# Patient Record
Sex: Male | Born: 1935 | Race: White | Hispanic: No | Smoking: Former smoker
Health system: Southern US, Community
[De-identification: ages and names within clinical notes are randomized; demographics above are authoritative.]

## PROBLEM LIST (undated history)

## (undated) DIAGNOSIS — K56609 Unspecified intestinal obstruction, unspecified as to partial versus complete obstruction: Secondary | ICD-10-CM

## (undated) DIAGNOSIS — Z95810 Presence of automatic (implantable) cardiac defibrillator: Secondary | ICD-10-CM

## (undated) DIAGNOSIS — Q676 Pectus excavatum: Secondary | ICD-10-CM

## (undated) DIAGNOSIS — I2699 Other pulmonary embolism without acute cor pulmonale: Secondary | ICD-10-CM

## (undated) DIAGNOSIS — I10 Essential (primary) hypertension: Secondary | ICD-10-CM

## (undated) DIAGNOSIS — M810 Age-related osteoporosis without current pathological fracture: Secondary | ICD-10-CM

## (undated) DIAGNOSIS — F32A Depression, unspecified: Secondary | ICD-10-CM

## (undated) DIAGNOSIS — K4090 Unilateral inguinal hernia, without obstruction or gangrene, not specified as recurrent: Secondary | ICD-10-CM

## (undated) DIAGNOSIS — I2510 Atherosclerotic heart disease of native coronary artery without angina pectoris: Secondary | ICD-10-CM

## (undated) DIAGNOSIS — I495 Sick sinus syndrome: Secondary | ICD-10-CM

## (undated) DIAGNOSIS — F329 Major depressive disorder, single episode, unspecified: Secondary | ICD-10-CM

## (undated) DIAGNOSIS — E785 Hyperlipidemia, unspecified: Secondary | ICD-10-CM

## (undated) DIAGNOSIS — Z87442 Personal history of urinary calculi: Secondary | ICD-10-CM

## (undated) DIAGNOSIS — I5022 Chronic systolic (congestive) heart failure: Secondary | ICD-10-CM

## (undated) DIAGNOSIS — I255 Ischemic cardiomyopathy: Secondary | ICD-10-CM

## (undated) DIAGNOSIS — D649 Anemia, unspecified: Secondary | ICD-10-CM

## (undated) DIAGNOSIS — N281 Cyst of kidney, acquired: Secondary | ICD-10-CM

## (undated) DIAGNOSIS — I480 Paroxysmal atrial fibrillation: Secondary | ICD-10-CM

## (undated) DIAGNOSIS — C4490 Unspecified malignant neoplasm of skin, unspecified: Secondary | ICD-10-CM

## (undated) DIAGNOSIS — I4901 Ventricular fibrillation: Secondary | ICD-10-CM

## (undated) DIAGNOSIS — D689 Coagulation defect, unspecified: Secondary | ICD-10-CM

## (undated) DIAGNOSIS — I219 Acute myocardial infarction, unspecified: Secondary | ICD-10-CM

## (undated) HISTORY — PX: CYSTOSCOPY/RETROGRADE/URETEROSCOPY/STONE EXTRACTION WITH BASKET: SHX5317

## (undated) HISTORY — PX: PTCA: SHX146

## (undated) HISTORY — DX: Other pulmonary embolism without acute cor pulmonale: I26.99

## (undated) HISTORY — DX: Major depressive disorder, single episode, unspecified: F32.9

## (undated) HISTORY — DX: Depression, unspecified: F32.A

## (undated) HISTORY — DX: Pectus excavatum: Q67.6

## (undated) HISTORY — PX: COLONOSCOPY: SHX174

## (undated) HISTORY — PX: SKIN CANCER EXCISION: SHX779

## (undated) HISTORY — DX: Personal history of urinary calculi: Z87.442

## (undated) HISTORY — DX: Coagulation defect, unspecified: D68.9

## (undated) HISTORY — PX: CORONARY ANGIOPLASTY WITH STENT PLACEMENT: SHX49

## (undated) HISTORY — DX: Atherosclerotic heart disease of native coronary artery without angina pectoris: I25.10

## (undated) HISTORY — DX: Anemia, unspecified: D64.9

## (undated) HISTORY — DX: Unspecified intestinal obstruction, unspecified as to partial versus complete obstruction: K56.609

## (undated) HISTORY — PX: CATARACT EXTRACTION W/ INTRAOCULAR LENS  IMPLANT, BILATERAL: SHX1307

## (undated) HISTORY — PX: TRANSURETHRAL RESECTION OF PROSTATE: SHX73

## (undated) HISTORY — DX: Ischemic cardiomyopathy: I25.5

## (undated) HISTORY — DX: Age-related osteoporosis without current pathological fracture: M81.0

## (undated) HISTORY — DX: Sick sinus syndrome: I49.5

## (undated) HISTORY — DX: Chronic systolic (congestive) heart failure: I50.22

## (undated) HISTORY — DX: Unilateral inguinal hernia, without obstruction or gangrene, not specified as recurrent: K40.90

## (undated) HISTORY — DX: Paroxysmal atrial fibrillation: I48.0

## (undated) HISTORY — DX: Hyperlipidemia, unspecified: E78.5

---

## 1994-06-08 HISTORY — PX: INGUINAL HERNIA REPAIR: SUR1180

## 1994-06-08 HISTORY — PX: CORONARY ARTERY BYPASS GRAFT: SHX141

## 2000-06-01 ENCOUNTER — Inpatient Hospital Stay (HOSPITAL_COMMUNITY): Admission: EM | Admit: 2000-06-01 | Discharge: 2000-06-09 | Payer: Self-pay

## 2000-06-01 ENCOUNTER — Encounter: Payer: Self-pay | Admitting: Emergency Medicine

## 2000-06-01 ENCOUNTER — Encounter: Payer: Self-pay | Admitting: Surgery

## 2000-06-02 ENCOUNTER — Encounter: Payer: Self-pay | Admitting: General Surgery

## 2001-04-01 ENCOUNTER — Inpatient Hospital Stay (HOSPITAL_COMMUNITY): Admission: AD | Admit: 2001-04-01 | Discharge: 2001-04-05 | Payer: Self-pay | Admitting: Cardiology

## 2001-04-03 ENCOUNTER — Encounter: Payer: Self-pay | Admitting: Cardiology

## 2001-09-16 ENCOUNTER — Inpatient Hospital Stay (HOSPITAL_COMMUNITY): Admission: AD | Admit: 2001-09-16 | Discharge: 2001-09-23 | Payer: Self-pay | Admitting: Cardiology

## 2001-09-16 ENCOUNTER — Encounter: Payer: Self-pay | Admitting: Cardiology

## 2004-05-05 ENCOUNTER — Ambulatory Visit: Payer: Self-pay | Admitting: Cardiology

## 2004-06-03 ENCOUNTER — Ambulatory Visit: Payer: Self-pay | Admitting: Cardiology

## 2005-04-15 ENCOUNTER — Ambulatory Visit: Payer: Self-pay | Admitting: Cardiology

## 2005-05-04 ENCOUNTER — Ambulatory Visit: Payer: Self-pay | Admitting: Cardiology

## 2005-05-18 ENCOUNTER — Ambulatory Visit: Payer: Self-pay

## 2006-04-12 ENCOUNTER — Ambulatory Visit: Payer: Self-pay | Admitting: Cardiology

## 2006-04-19 ENCOUNTER — Ambulatory Visit: Payer: Self-pay | Admitting: Cardiovascular Disease

## 2006-04-19 ENCOUNTER — Ambulatory Visit: Payer: Self-pay

## 2006-05-03 ENCOUNTER — Ambulatory Visit: Payer: Self-pay | Admitting: Cardiology

## 2007-04-14 ENCOUNTER — Ambulatory Visit: Payer: Self-pay | Admitting: Cardiology

## 2007-09-05 ENCOUNTER — Ambulatory Visit: Payer: Self-pay | Admitting: Cardiology

## 2007-09-05 LAB — CONVERTED CEMR LAB
ALT: 18 units/L (ref 0–53)
AST: 22 units/L (ref 0–37)
Albumin: 3.6 g/dL (ref 3.5–5.2)
Alkaline Phosphatase: 43 units/L (ref 39–117)
Bilirubin, Direct: 0.2 mg/dL (ref 0.0–0.3)
Cholesterol: 130 mg/dL (ref 0–200)
HDL: 43.5 mg/dL (ref >39.0)
LDL Cholesterol: 72 mg/dL (ref 0–99)
Total Bilirubin: 0.8 mg/dL (ref 0.3–1.2)
Total CHOL/HDL Ratio: 3
Total Protein: 6.3 g/dL (ref 6.0–8.3)
Triglycerides: 73 mg/dL (ref 0–149)
VLDL: 15 mg/dL (ref 0–40)

## 2008-04-02 ENCOUNTER — Ambulatory Visit: Payer: Self-pay | Admitting: Cardiology

## 2008-04-02 LAB — CONVERTED CEMR LAB
ALT: 17 units/L (ref 0–53)
AST: 24 units/L (ref 0–37)
Albumin: 3.6 g/dL (ref 3.5–5.2)
Alkaline Phosphatase: 45 units/L (ref 39–117)
Bilirubin, Direct: 0.1 mg/dL (ref 0.0–0.3)
Cholesterol: 129 mg/dL (ref 0–200)
HDL: 41.9 mg/dL (ref >39.0)
LDL Cholesterol: 72 mg/dL (ref 0–99)
Total Bilirubin: 0.8 mg/dL (ref 0.3–1.2)
Total CHOL/HDL Ratio: 3.1
Total Protein: 6.2 g/dL (ref 6.0–8.3)
Triglycerides: 75 mg/dL (ref 0–149)
VLDL: 15 mg/dL (ref 0–40)

## 2008-04-18 ENCOUNTER — Ambulatory Visit: Payer: Self-pay | Admitting: Cardiology

## 2008-05-07 ENCOUNTER — Ambulatory Visit: Payer: Self-pay

## 2008-09-27 ENCOUNTER — Telehealth (INDEPENDENT_AMBULATORY_CARE_PROVIDER_SITE_OTHER): Payer: Self-pay | Admitting: *Deleted

## 2009-04-17 DIAGNOSIS — Z87891 Personal history of nicotine dependence: Secondary | ICD-10-CM | POA: Insufficient documentation

## 2009-04-17 DIAGNOSIS — I10 Essential (primary) hypertension: Secondary | ICD-10-CM | POA: Insufficient documentation

## 2009-04-17 DIAGNOSIS — E785 Hyperlipidemia, unspecified: Secondary | ICD-10-CM | POA: Insufficient documentation

## 2009-04-17 DIAGNOSIS — I252 Old myocardial infarction: Secondary | ICD-10-CM | POA: Insufficient documentation

## 2009-05-01 ENCOUNTER — Ambulatory Visit: Payer: Self-pay | Admitting: Cardiology

## 2009-05-06 ENCOUNTER — Ambulatory Visit: Payer: Self-pay | Admitting: Cardiology

## 2009-05-07 LAB — CONVERTED CEMR LAB
ALT: 16 units/L (ref 0–53)
AST: 23 units/L (ref 0–37)
Albumin: 3.7 g/dL (ref 3.5–5.2)
Alkaline Phosphatase: 48 units/L (ref 39–117)
BUN: 23 mg/dL (ref 6–23)
Bilirubin, Direct: 0.1 mg/dL (ref 0.0–0.3)
CO2: 28 meq/L (ref 19–32)
Calcium: 8.8 mg/dL (ref 8.4–10.5)
Chloride: 108 meq/L (ref 96–112)
Cholesterol: 128 mg/dL (ref 0–200)
Creatinine, Ser: 1.1 mg/dL (ref 0.4–1.5)
GFR calc non Af Amer: 69.5 mL/min (ref >60)
Glucose, Bld: 95 mg/dL (ref 70–99)
HDL: 47.5 mg/dL (ref >39.00)
LDL Cholesterol: 71 mg/dL (ref 0–99)
Potassium: 4.2 meq/L (ref 3.5–5.1)
Sodium: 142 meq/L (ref 135–145)
Total Bilirubin: 0.9 mg/dL (ref 0.3–1.2)
Total CHOL/HDL Ratio: 3
Total Protein: 6.5 g/dL (ref 6.0–8.3)
Triglycerides: 49 mg/dL (ref 0.0–149.0)
VLDL: 9.8 mg/dL (ref 0.0–40.0)

## 2010-01-08 ENCOUNTER — Telehealth: Payer: Self-pay | Admitting: Cardiology

## 2010-04-21 ENCOUNTER — Telehealth: Payer: Self-pay | Admitting: Cardiology

## 2010-04-22 ENCOUNTER — Ambulatory Visit: Payer: Self-pay | Admitting: Cardiology

## 2010-04-25 ENCOUNTER — Telehealth: Payer: Self-pay | Admitting: Cardiology

## 2010-04-25 LAB — CONVERTED CEMR LAB
ALT: 18 units/L (ref 0–53)
AST: 22 units/L (ref 0–37)
Albumin: 3.7 g/dL (ref 3.5–5.2)
Alkaline Phosphatase: 43 units/L (ref 39–117)
BUN: 29 mg/dL — ABNORMAL HIGH (ref 6–23)
Bilirubin, Direct: 0.2 mg/dL (ref 0.0–0.3)
CO2: 27 meq/L (ref 19–32)
Calcium: 8.5 mg/dL (ref 8.4–10.5)
Chloride: 106 meq/L (ref 96–112)
Cholesterol: 127 mg/dL (ref 0–200)
Creatinine, Ser: 1.2 mg/dL (ref 0.4–1.5)
GFR calc non Af Amer: 65.19 mL/min (ref >60)
Glucose, Bld: 96 mg/dL (ref 70–99)
HDL: 44.8 mg/dL (ref >39.00)
LDL Cholesterol: 71 mg/dL (ref 0–99)
Potassium: 4.3 meq/L (ref 3.5–5.1)
Sodium: 138 meq/L (ref 135–145)
Total Bilirubin: 0.9 mg/dL (ref 0.3–1.2)
Total CHOL/HDL Ratio: 3
Total Protein: 5.7 g/dL — ABNORMAL LOW (ref 6.0–8.3)
Triglycerides: 56 mg/dL (ref 0.0–149.0)
VLDL: 11.2 mg/dL (ref 0.0–40.0)

## 2010-06-04 ENCOUNTER — Encounter: Payer: Self-pay | Admitting: Cardiology

## 2010-06-04 ENCOUNTER — Ambulatory Visit: Payer: Self-pay | Admitting: Cardiology

## 2010-06-04 DIAGNOSIS — R0602 Shortness of breath: Secondary | ICD-10-CM

## 2010-06-12 ENCOUNTER — Telehealth (INDEPENDENT_AMBULATORY_CARE_PROVIDER_SITE_OTHER): Payer: Self-pay | Admitting: *Deleted

## 2010-06-16 ENCOUNTER — Ambulatory Visit: Admission: RE | Admit: 2010-06-16 | Discharge: 2010-06-16 | Payer: Self-pay | Source: Home / Self Care

## 2010-06-16 ENCOUNTER — Encounter (HOSPITAL_COMMUNITY)
Admission: RE | Admit: 2010-06-16 | Discharge: 2010-07-08 | Payer: Self-pay | Source: Home / Self Care | Attending: Cardiology | Admitting: Cardiology

## 2010-06-16 ENCOUNTER — Encounter: Payer: Self-pay | Admitting: Cardiology

## 2010-07-08 NOTE — Progress Notes (Signed)
Summary: Calling regarding medications   Phone Note Call from Patient Call back at Home Phone [phone]   Caller: Patient Summary of Call: Pt request call about medication Initial call taken by: Judie Grieve,  January 08, 2010 2:40 PM  Follow-up for Phone Call        Louis Reid calls today with c/o "feeling a little weaker than normal and almost dizzy.  He will reduce his Benazepril to 10mg  daily and will recheck his blood pressure.  He will also stop his simvastatin for a few days to see if he feels better.  He is also back on Fosamax.  Dr Daleen Squibb is aware. Pt will call back if he is/is not feeling better. Mylo Red RN     Appended Document: Calling regarding medications  Reviewed Louis Doom, MD  Appended Document: Calling regarding medications [name] has found that he might have had a "bug" last week as he is back on his Simvastatin without any nausea now.  His blood pressure (sitting) is 117/73   HR  59  (standing) 106/72  HR 72  on the reduced dose of Benazepril 10mg  daily.  [name] will continue taking simvastatin and Benazepril 10mg  daily and call back if he has any further problems.  I will forward this to Dr. Lonia Chimera RN

## 2010-07-08 NOTE — Progress Notes (Signed)
Summary: re order for labwork   Phone Note Call from Patient   Caller: Patient [phone] Reason for Call: Talk to Nurse Summary of Call: pt calling to see if he needs lab work prior to appt with dr wall, if so wants tomorrow if poss, he will be in [place] then Initial call taken by: Glynda Jaeger,  April 21, 2010 10:20 AM  Follow-up for Phone Call        I spoke with Mr. Mcmillen this am and he will come to the lab here tomorrow for fasting lipids,liver, bmp.  He is due for his yearly appt. with Dr. Daleen Squibb and will make appt. tomorrow. Mylo Red RN

## 2010-07-08 NOTE — Progress Notes (Signed)
Summary: test results   Phone Note Call from Patient Call back at Home Phone [phone]   Caller: Patient Summary of Call: test results Initial call taken by: Judie Grieve,  April 25, 2010 11:26 AM  Follow-up for Phone Call        Pt aware of lab results. Mylo Red RN

## 2010-07-10 NOTE — Progress Notes (Signed)
Summary: Nuclear Pre-Procedure  Phone Note Outgoing Call   Call placed by: Milana Na, EMT-P,  June 12, 2010 1:51 PM Summary of Call: Reviewed information on Myoview Information Sheet (see scanned document for further details).  No Answerx2.     Nuclear Med Background Indications for Stress Test: Evaluation for Ischemia, Graft Patency, Stent Patency, PTCA Patency   History: Angioplasty, CABG, COPD, Heart Catheterization, Myocardial Infarction, Myocardial Perfusion Study, Stents  History Comments: '96 MI--AWMI 10/02 Angiolpasty/Stents LAD 04/03 Heart Cath EF60% Severe 3V DZ 2 grafts occluded Brady Therapy LAD 11/09 MPS NL EF 64%  Symptoms: DOE    Nuclear Pre-Procedure Cardiac Risk Factors: Family History - CAD, History of Smoking, Hypertension, Lipids Height (in): 70  Nuclear Med Study Referring MD:  T.Wall

## 2010-07-10 NOTE — Assessment & Plan Note (Signed)
Summary: YEARLY ./CY  Medications Added BENAZEPRIL HCL 5 MG TABS (BENAZEPRIL HCL) 1 cap once daily FOSAMAX 70 MG TABS (ALENDRONATE SODIUM) 1 tab weekly BENEFIBER  POWD (WHEAT DEXTRIN) as needed      Allergies Added: NKDA  Visit Type:  1 YR F/U Primary Provider:  Windle Guard  CC:  no cardiac complaints today.  History of Present Illness: [name] comes in today for followup of his coronary disease. He did have some dyspnea on exertion. He denies history of angina. Last stress test was 2 years ago.  Laboratory data shows his lipids to be at goal. Labs reviewed with the patient.  He denies orthopnea, PND or edema. The circumflex by with his medications.  Clinical Reports Reviewed:  Cardiac Cath:  09/19/2001: Cardiac Cath Findings:  LEFT VENTRICULOGRAM:  Obtained in the RAO projection.  There was significant ectopy, but there appeared to be well preserved wall motion, with an EF of approximately 60%.  CONCLUSION:  Severe three-vessel coronary artery disease.  Two grafts occluded.  Nonobstructive disease in the remaining grafts.  All of this is unchanged from the previous catheterization.  There is native 99% and 90% stenosis in the LAD.  PLAN:  The patient will have brachytherapy of this per Dr. Juanda Chance.    Dictated by:   Rollene Rotunda, M.D. [facility] Attending Physician:  Mirian Mo DD:  09/19/01 TD:  09/19/01 Job: [id number]  04/04/2001: Cardiac Cath Findings:  CONCLUSIONS: 1. Successful stenting of the proximal left anterior descending stenosis with    improvement in percent diameter narrowing from 95% to less than 10%. 2. Successful Cutting Balloon angioplasty of the lesion in the distal    left anterior descending with improvement in percent diameter narrowing    from 90% to 20%.  DISPOSITION: The patient was returned to the postangioplasty unit for further observation. Dictated by:   Everardo Beals Juanda Chance, M.D. [facility] Attending Physician:  Mirian Mo DD:  04/04/01 TD:  04/04/01 Job: [id number] Cardiac Cath Findings:  RIGHT ANTERIOR OBLIQUE VENTRICULOGRAPHY: The RAO ventriculography showed minimal apical wall hypokinesis.  EF was 65%. There is no gradient across the aortic valve and no MR.  IMPRESSION: Films were reviewed with Dr. Juanda Chance and Dr. Gerri Spore. We will proceed with complex intervention of the native left anterior descending since it is not protected. His right coronary artery is not critical enough to warrant intervention even though the bypass graft to this vessel is also occluded. Dictated by:   Noralyn Pick Eden Emms, M.D. [facility] Attending Physician:  Mirian Mo DD:  04/04/01 TD:  04/04/01 Job: [id number]  Nuclear Study:  05/07/2008:  Excerise capacity: Adenosine with low level exercise  Blood Pressure response: Normal blood pressure response  Clinical symptoms: Atypical chest pain  ECG impression: No significant ST sgement change suggestive of ischemia  Overall impression: Normal stress nuclear study.  Noralyn Pick. Eden Emms, MD   04/19/2006:  Excerise capacity: Adenosine with low level exercise  Blood Pressure response: Normal blood pressure response  Clinical symptoms: Chest tightness; leg fatigue; flushing  ECG impression: No significant ST segment change suggestive of ischemia  Overall impression: Negative pharmacologic stress nuclear study. No significant change compared with prior study of 12/06.  [place] Bing, MD   Current Medications (verified): 1)  Simvastatin 20 Mg Tabs (Simvastatin) .Marland Kitchen.. 1 Tab At Bedtime 2)  Benazepril Hcl 5 Mg Tabs (Benazepril Hcl) .Marland Kitchen.. 1 Cap Once Daily 3)  Aspirin Ec 325 Mg Tbec (Aspirin) .... Take One Tablet  By Mouth Daily 4)  Stool Softener 100 Mg Caps (Docusate Sodium) .Marland Kitchen.. 1 Cap Once Daily 5)  Multivitamins   Tabs (Multiple Vitamin) .Marland Kitchen.. 1 Tab Once Daily 6)  Fosamax 70 Mg Tabs (Alendronate Sodium) .Marland Kitchen.. 1 Tab Weekly 7)  Benefiber  Powd (Wheat  Dextrin) .... As Needed  Allergies (verified): No Known Drug Allergies  Past History:  Past Medical History: Last updated: 04/17/2009 MYOCARDIAL INFARCTION, ACUTE, ANTEROLATERAL WALL (ICD-410.00) CAD, ARTERY BYPASS GRAFT/ 1996 (ICD-414.04) SINUS BRADYCARDIA/ASYMPTOMATIC (ICD-427.81) HYPERTENSION (ICD-401.9) HYPERLIPIDEMIA (ICD-272.4) TOBACCO USE, QUIT (ICD-V15.82)  Past Surgical History: Last updated: 04/17/2009 Status post percutaneous intervention/stent to the left anterior descending artery - details as noted above.  Ejection fraction 60%. Percutaneous transluminal coronary intervention and Brachy therapy.Marland KitchenCARDIOLOGISTEverardo Beals Juanda Chance, M.D. CABG 1996 History of right inguinal hernia repair Status post transurethral resection of the prostate.  Family History: Last updated: 04/17/2009  Fairly unremarkable.  The patient is generally healthy.  Social History: Last updated: 04/17/2009 Tobacco Use - Former.  Alcohol Use - no  Risk Factors: Smoking Status: quit (04/17/2009)  Review of Systems       negative other than history of present illness  Vital Signs:  Patient profile:   77 year old male Height:      70 inches Weight:      135.25 pounds BMI:     19.48 Pulse rate:   57 / minute Pulse rhythm:   irregular BP sitting:   128 / 74  (left arm) Cuff size:   large  Vitals Entered By: Danielle Rankin, CMA (June 04, 2010 9:21 AM)  Physical Exam  General:  Well developed, well nourished, in no acute distress. Head:  normocephalic and atraumatic Eyes:  PERRLA/EOM intact; conjunctiva and lids normal. Neck:  Neck supple, no JVD. No masses, thyromegaly or abnormal cervical nodes. Chest Wall:  marked pectus deformity Lungs:  Clear bilaterally to auscultation and percussion. Heart:  PMI nondisplaced, regular rate and rhythm, no murmur, no carotid bruits Abdomen:  positive bowel sounds, nondistended, no bruit Msk:  decreased ROM.   Pulses:  pulses normal in all 4  extremities Extremities:  No clubbing or cyanosis. Neurologic:  Alert and oriented x 3. Skin:  Intact without lesions or rashes. Psych:  Normal affect.   Problems:  Medical Problems Added: 1)  Dx of Dyspnea  (ICD-786.05)  Impression & Recommendations:  Problem # 1:  DYSPNEA (ICD-786.05) Assessment Deteriorated  stress Myoview to rule out obstructive coronary disease or ischemia. His updated medication list for this problem includes:    Benazepril Hcl 5 Mg Tabs (Benazepril hcl) .Marland Kitchen... 1 cap once daily    Aspirin Ec 325 Mg Tbec (Aspirin) .Marland Kitchen... Take one tablet by mouth daily  Orders: Nuclear Stress Test (Nuc Stress Test)  Problem # 2:  CAD, ARTERY BYPASS GRAFT/ 1996 (ICD-414.04) Assessment: Unchanged  His updated medication list for this problem includes:    Benazepril Hcl 5 Mg Tabs (Benazepril hcl) .Marland Kitchen... 1 cap once daily    Aspirin Ec 325 Mg Tbec (Aspirin) .Marland Kitchen... Take one tablet by mouth daily  Orders: EKG w/ Interpretation (93000) Nuclear Stress Test (Nuc Stress Test)  Problem # 3:  HYPERTENSION (ICD-401.9) Assessment: Improved  His updated medication list for this problem includes:    Benazepril Hcl 5 Mg Tabs (Benazepril hcl) .Marland Kitchen... 1 cap once daily    Aspirin Ec 325 Mg Tbec (Aspirin) .Marland Kitchen... Take one tablet by mouth daily  Problem # 4:  HYPERLIPIDEMIA (ICD-272.4) Assessment: Improved  His updated medication list  for this problem includes:    Simvastatin 20 Mg Tabs (Simvastatin) .Marland Kitchen... 1 tab at bedtime  Patient Instructions: 1)  Your physician recommends that you schedule a follow-up appointment in: 1 year with Dr. Daleen Squibb 2)  Your physician recommends that you continue on your current medications as directed. Please refer to the Current Medication list given to you today. 3)  Your physician has requested that you have a lexiscan myoview.  For further information please visit [email or web address]  Please follow instruction sheet, as given.

## 2010-07-10 NOTE — Assessment & Plan Note (Signed)
Summary: Cardiology Nuclear Testing  Nuclear Med Background Indications for Stress Test: Evaluation for Ischemia, Graft Patency, Stent Patency, PTCA Patency   History: Angioplasty, CABG, COPD, Heart Catheterization, Myocardial Infarction, Myocardial Perfusion Study, Stents  History Comments: '96 MI--AWMI 10/02 Angioplasty/Stents LAD 04/03 Heart Cath EF60% Severe 3V DZ 2 grafts occluded Brady Therapy LAD 11/09 MPS NL EF 64%  Symptoms: Dizziness, DOE, Fatigue  Symptoms Comments: Dizziness assoc with BP meds   Nuclear Pre-Procedure Cardiac Risk Factors: Family History - CAD, History of Smoking, Hypertension, Lipids Caffeine/Decaff Intake: none NPO After: 11:00 PM Lungs: clear IV 0.9% NS with Angio Cath: 22g     IV Site: R Wrist IV Started by: Cathlyn Parsons, RN Chest Size (in) 38     Height (in): 70 Weight (lb): 133 BMI: 19.15  Nuclear Med Study 1 or 2 day study:  1 day     Stress Test Type:  Treadmill/Lexiscan Reading MD:  Olga Millers, MD     Referring MD:  T.Wall Resting Radionuclide:  Technetium 60m Tetrofosmin     Resting Radionuclide Dose:  11 mCi  Stress Radionuclide:  Technetium 90m Tetrofosmin     Stress Radionuclide Dose:  33 mCi   Stress Protocol Exercise Time (min):  min     Max HR:  123 bpm     Predicted Max HR:  145 bpm  Max Systolic BP: 164 mm Hg     Percent Max HR:  84.83 %     METS: 1.6 Rate Pressure Product:  91478  Lexiscan: 0.4 mg   Stress Test Technologist:  Cathlyn Parsons, RN     Nuclear Technologist:  Doyne Keel, CNMT  Rest Procedure  Myocardial perfusion imaging was performed at rest 45 minutes following the intravenous administration of Technetium 45m Tetrofosmin.  Stress Procedure  The patient received IV Lexiscan 0.4 mg over 15-seconds with concurrent low level exercise and then Technetium 35m Tetrofosmin was injected at 30-seconds while the patient continued walking one more minute. Patient had chest tightness 4/10 with Lexi  infusion.  Patient had occas PVC's with a couplet and short run of trigeminy. There were no significant changes with Lexiscan.  Quantitative spect images were obtained after a 45 minute delay.  QPS Raw Data Images:  Acquisition technically good; normal left ventricular size. Stress Images:  Normal homogeneous uptake in all areas of the myocardium. Rest Images:  Normal homogeneous uptake in all areas of the myocardium. Subtraction (SDS):  No evidence of ischemia. Transient Ischemic Dilatation:  .99  (Normal <1.22)  Lung/Heart Ratio:  .24  (Normal <0.45)  Quantitative Gated Spect Images QGS EDV:  83 ml QGS ESV:  26 ml QGS EF:  69 % QGS cine images:  Normal wall motion.   Overall Impression  Exercise Capacity: Lexiscan with no exercise. BP Response: Normal blood pressure response. Clinical Symptoms: There is chest pain ECG Impression: No significant ST segment change suggestive of ischemia. Overall Impression: Normal lexiscan nuclear study with no ischemia or infarction.  Appended Document: Cardiology Nuclear Testing excellent result. no change in treatment.  Appended Document: Cardiology Nuclear Testing Pt aware of results. Mylo Red RN

## 2010-10-21 NOTE — Assessment & Plan Note (Signed)
[facility]                            CARDIOLOGY OFFICE NOTE   NAME:Gaede, DONNA SILVERMAN                       MRN:          [id number]  DATE:04/14/2007                            DOB:          [birth year]    Mr. Donahoe returns today for further management of the following issues:  1. Coronary artery disease.  He is having no angina.  Stress Myoview,      November 2007, showed no ischemia.  Ejection fraction was 65%.  2. Hyperlipidemia.  He is getting slammed on purchasing Vytorin and      would like to switch to generic simvastatin.  I think this is quite      reasonable.  His lipids last year were at goal.  In fact, they were      low.  3. Hypertension.  4. Asymptomatic sinus bradycardia.  5. Severe pectus excavatum.   MEDICATIONS:  1. Enteric-coated aspirin 325 mg daily.  2. Stool softener.  3. Benazepril 20 mg daily.  4. Vytorin 10/10 daily.  5. Lopressor 12.5 b.i.d.  6. Multivitamin.  7. Fosamax 70 mg daily.   His blood pressure is 126/71, his pulse 59 and regular.  His weight is  140 up 4.  HEENT:  Normocephalic, atraumatic.  PERRLA, extraocular movements  intact, sclerae clear.  Facial symmetry is normal.  Carotid upstrokes are equal bilaterally without bruits, no JVD.  Thyroid  is not enlarged.  Trachea is midline.  LUNGS:  Clear.  CHEST:  Severe pectus deformity.  He has a normal S1, S2.  PMI is  difficult to appreciate.  ABDOMEN:  Soft, good bowel sounds.  No midline bruit.  EXTREMITIES:  No edema.  Pulses are intact.   Electrocardiogram shows sinus brady rate of 58 beats per minute.  His  intervals are normal.  There are no ST segment changes.   Mr. Ginley is doing well.  He has been having a lot of bad dreams and  wants to stop his Lopressor, as he has read that this causes this  problem.  I think this is quite reasonable.  He is only on 12.5 mg p.o.  b.i.d.   In addition, he wants to switch to simvastatin.  We will change him to  simvastatin 20 mg to replace Vytorin starting January 2009 which is his  request.   I will plan on seeing him back in a year.     Thomas C. Daleen Squibb, MD, [facility]  Electronically Signed    TCW/MedQ  DD: 04/14/2007  DT: 04/14/2007  Job #: [id number]

## 2010-10-21 NOTE — Assessment & Plan Note (Signed)
[facility]                            CARDIOLOGY OFFICE NOTE   NAME:Louis Reid                       MRN:          [id number]  DATE:04/18/2008                            DOB:          1933-03-10    Louis Reid comes in today for followup.   He offers no complaints of angina or ischemia.   He is very compliant with his medication.  He says since I stopped his  Lopressor, he has had no more dizzy spells.  He had some bradycardia on  past visits.   PROBLEM LIST:  1. Coronary artery disease.  He has normal left ventricular function.      He is due a stress Myoview.  2. Hyperlipidemia.  He switched to simvastatin because of the cost to      Vytorin.  His total cholesterol is 129, triglycerides are 75, HDL      41.9, and LDL 72, on 20 mg of simvastatin.  His LFTs were normal.  3. Hypertension under good control.  4. Asymptomatic sinus bradycardia.  At this point, has had severe      pectus excavatum.   MEDICATIONS:  1. Aspirin 325 mg per day.  2. Stool softener.  3. Benazepril 20 mg a day.  4. Multivitamin.  5. Fosamax 70 mg q. week.  6. Simvastatin 20 mg a day.   PHYSICAL EXAMINATION:  VITAL SIGNS:  His blood pressure today is 148/80.  It is usually better than this.  His pulse is 65 and regular.  His  weight is 133, down 7.  GENERAL:  He is in no acute distress.  NECK:  Carotid upstrokes are equal bilaterally without bruits.  No JVD.  Thyroid is not enlarged.  Trachea is midline.  LUNGS:  Clear to auscultation and percussion.  His chest shows severe  pectus excavatum.  HEART:  Poorly appreciated PMI.  Normal S1 and S2.  No gallop.  ABDOMEN:  Soft.  Good bowel sounds.  No hepatomegaly.  EXTREMITIES:  There is no cyanosis, clubbing, or edema.  Pulses are  intact.  NEUROLOGIC:  Intact.   Electrocardiogram shows normal sinus rhythm with possible left atrial  enlargement, RSR prime, and V1 and V2, which is unchanged.   ASSESSMENT AND  PLAN:  Louis Reid is doing well.  I have arranged for him  to have an adenosine rest stress Myoview.  If this is negative for  ischemia, I will see him back in a year.  No changes were made to his  medical regimen.     Thomas C. Daleen Squibb, MD, [facility]  Electronically Signed    TCW/MedQ  DD: 04/18/2008  DT: 04/19/2008  Job #: [id number]

## 2010-10-24 NOTE — Cardiovascular Report (Signed)
[facility]  Patient:    Louis Reid, Louis Reid Visit Number: [id number] MRN: [id number]          Service Type: MED Location: [phone] Attending Physician:  Mirian Mo Dictated by:   Rollene Rotunda, M.D. [facility] Proc. Date: 09/19/01 Admit Date:  09/16/2001                          Cardiac Catheterization  PROCEDURES: 1. Left heart catheterization. 2. Coronary arteriography.  INDICATIONS:  A patient with unstable angina and known coronary disease, status post CABG in 1996.  He had an atretic LIMA to his LAD at the last catheterization.  He had tight stenosis in the LAD and had tandem stents placed by Dr. Juanda Chance.  PROCEDURAL NOTE:  Left heart catheterization was performed via the right femoral artery.  The artery was cannulated using anterior wall puncture.  A 6-French arterial sheath was inserted via the modified Seldinger technique. Preformed Judkins and a pigtail catheter were utilized.  The patient tolerated the procedure well and left the lab in stable condition.  RESULTS:  HEMODYNAMICS: 1. LV:  145/21. 2. AO: 144/68.  CORONARIES: 1. LEFT MAIN:  Normal. 2. LEFT ANTERIOR DESCENDING ARTERY:  Had proximal and mid stents.  There was    a 99% proximal stenosis in the first stent.  There was a 90% long mid    LAD stenosis in the distal segment of the second stent.  There was 50%    apical stenosis.  A first diagonal branch was moderate sized and was    subtotally occluded, seemed to fill via a vein graft. 3. CIRCUMFLEX CORONARY ARTERY:  Occluded at the ostium.  There was a medium    sized first marginal and a larger second marginal perfused by sequential    vein graft. 4. RIGHT CORONARY ARTERY:  Dominant.  It had 50-60% ostial stenosis, followed    by 50% proximal stenosis, followed by 30% mid stenosis. 5. GRAFTS:    a. LIMA to the LAD was not injected.  It had previously been demonstrated       to be atretic.    b. Saphenous vein  graft to the right coronary artery was not injected.  It       was occluded on a previous catheterization.    c. A saphenous vein graft to the diagonal was patent, with luminal       irregularities in the mid and distal segment of the graft.    d. A saphenous vein graft sequential to a first and second marginal had       a 40% stenosis before the first anastomosis.  There was streaming flow       there.  The appearance of this was unchanged from the catheterization in       October 2002.  LEFT VENTRICULOGRAM:  Obtained in the RAO projection.  There was significant ectopy, but there appeared to be well preserved wall motion, with an EF of approximately 60%.  CONCLUSION:  Severe three-vessel coronary artery disease.  Two grafts occluded.  Nonobstructive disease in the remaining grafts.  All of this is unchanged from the previous catheterization.  There is native 99% and 90% stenosis in the LAD.  PLAN:  The patient will have brachytherapy of this per Dr. Juanda Chance.    Dictated by:   Rollene Rotunda, M.D. [facility] Attending Physician:  Mirian Mo DD:  09/19/01 TD:  09/19/01  Job: [id number]

## 2010-10-24 NOTE — Discharge Summary (Signed)
[place]. [facility]  Patient:    Louis Reid, Louis Reid Visit Number: [id number] MRN: [id number]          Service Type: MED Location: [phone] Attending Physician:  Louis Reid Dictated by:   Louis Reid, N.P. Admit Date:  09/16/2001 Discharge Date: 09/20/2001   CC:         Louis Reid., M.D.  Dr. Jeannetta Reid, [facility]   Discharge Summary  DATE OF BIRTH:  [birth year]  REASON FOR ADMISSION:  Unstable angina.  DISCHARGE DIAGNOSES: 1. Coronary artery disease, status post CABG surgery with cardiac    catheterization, October 2002, showing an atretic left internal arterial    graft to the left anterior descending with a 95% proximal left anterior    descending; 90% distal left anterior descending, and an occluded vein graft    to the right coronary artery, treated that admission with cutting balloon    angioplasty to the distal left anterior descending lesion and stenting of    the proximal lesion by Dr. Juanda Reid.  Critical restenosis found this    admission in the left anterior descending, this treated with PTCA and    brachytherapy by Dr. Juanda Reid. 2. Urinary retention secondary to benign prostatic hypertrophy. 3. Bilateral renal cysts. 4. Hypertension. 5. Hyperlipidemia.  HISTORY OF PRESENT ILLNESS:  This delightful 77 year old gentleman with past medical history outlined above was seen in the office by Dr. Daleen Reid for recurrent chest pain with bilateral arm numbness.  Given his history, it was deemed prudent to admit him for further evaluation and treatment by cardiac catheterization.  HOSPITAL COURSE:  The patient was admitted in stable condition to a telemetry unit, and he was heparinized per protocol and arteriography performed September 19, 2001, by Dr. Antoine Reid.  Results are described above.  The patients LIMA to the LAD was not injected due to atresia, and ventriculogram revealed an EF of 60%.  The patient  recovered uneventfully from his arteriography.  He did, however, develop some dysuria and frequency day two of admission.  A UA was checked, and this was negative for nitrites.  The patient was seen by Dr. Elige Reid this admission, and he was treated with Proscar 5 mg and Flomax 0.4 mg with resolution of symptoms.  The patient also experienced some anemia while on heparin with hemoglobins falling from an admission baseline of 13.9 to a nadir of 10.4 on day four of admission.  This began to trend upward prior to discharge.  PTCA and brachytherapy of the LAD was performed by Dr. Juanda Reid on September 20, 2001.  Stenoses in the proximal LAD was reduced from 95% to 20% and at mid vessel from 90% to 10%.  Following this, radiation oncology placed sources in the stents.  The patient tolerated the procedure well and was returned to the floor in stable condition.  On day six of admission, Dr. Daleen Reid judged the patient to be suitable for discharge pending ease of urination.  PHYSICAL EXAMINATION:  The patient offered no complaints.  VITAL SIGNS:  Stable.  HEART:  Regular rate and rhythm without murmur, rub or gallop.  LUNGS:  Clear to auscultation bilaterally.  EXTREMITIES:  Without clubbing, cyanosis, or edema.  Groin site was stable.  LABORATORY DATA:  At discharge as follows:  CBC:  WBC 5.5, hemoglobin 10.6, hematocrit 31.3, platelets 126.  Metabolic panel:  Sodium 141, potassium 3.9, chloride 110, carbon dioxide 26, BUN 10, creatinine 1.0, glucose 103. Postprocedure, cardiac  enzymes were negative.  Chest x-ray showed cardiomegaly, mild aortic elongation, and no acute changes. 12-lead EKG at discharge revealed a sinus bradycardia with a ventricular rate of 54 and a normal axis with small inferior Q waves.  DISPOSITION:  The patient is discharged to home in the care of his son.  DISCHARGE MEDICATIONS: 1. Aspirin 325 1 q.d. 2. Plavix 75 1 q.d. 3. Zocor 10 mg 1 q.d. 1800. 4. Foltx 1 q.d. 5.  Lotensin 20 1 q.d. 6. Lopressor 50, 1/2 tab q.d.  ACTIVITY RESTRICTIONS:  No heavy lifting, driving, sex, or tub baths for two days.  DIET:  Low fat, low salt, low cholesterol.  WOUND CARE:  The patient agrees to call the office if groin wound becomes hard or painful.  SPECIAL INSTRUCTIONS: 1. The patient is to call Dr. Elige Reid for any changes in his voiding patterns. 2. Follow-up will be with Dr. Daleen Reid, Oct 18, 2001, at 11:45.  The patient knows    to call in the interim with any problems, questions, or concerns or change    or increase in symptoms. Dictated by:   Louis Reid, N.P. Attending Physician:  Louis Reid DD:  09/22/01 TD:  09/22/01 Job: [id number]

## 2010-10-24 NOTE — Op Note (Signed)
[place]. [facility]  Patient:    Louis Reid, Louis Reid Visit Number: [id number] MRN: [id number]          Service Type: MED Location: [phone] Attending Physician:  Mirian Mo Dictated by:   Everardo Beals Juanda Chance, M.D. [facility] Proc. Date: 09/20/01 Admit Date:  09/16/2001   CC:         Cardiac Catheterization Laboratory  Jesse Sans. Daleen Squibb, M.D. [facility]  Buren Kos, M.D.  Rollene Rotunda, M.D. [facility]   Operative Report  PROCEDURE:  Percutaneous transluminal coronary intervention and Brachy therapy.  CARDIOLOGISTEverardo Beals Juanda Chance, M.D.  INDICATIONS:  Mr. Ambrosio is 77 years old and has had previous bypass surgery, and has also had previous stenting of the native LAD.  He was admitted with unstable angina and studied by Dr. Rollene Rotunda yesterday.  He found that the vein graft to the right coronary artery was occluded, which was old, and there was 60% narrowing in the proximal portion of the right coronary artery. The circumflex artery was occluded and the vein graft to the marginal and the posterolateral branch was working well.  The internal mammary artery to the LAD was atretic and the vein graft to the diagonal branch of the LAD was functioning well.  The native LAD had a 95% and 90% stenoses within the stent, which extended from the proximal to midportion of the LAD.  We elected to treat the LAD today with Brachy therapy.  DESCRIPTION OF PROCEDURE:  The procedure was performed via the right femoral artery and arterial sheath and a 7-French JCL 4.0 guiding catheter with side holes.  The patient was given weight-adjusted heparin to prolong the ACT to greater than 200 seconds, and was given double bolus Integrilin and infusion. We were able to pass a luge wire down the LAD without difficulty.  We used a 3.25 mm x 15.0 mm cutting balloon and performed three inflations, up to 10 atmospheres for 45 seconds in the proximal portion of the stent, and  three inflations up to nine atmospheres for 45 seconds in the distal portion of the stent.  We then removed the cutting balloon and positioned a 52.0 mm x 3.0 mm centering balloon.  After performing the run with the test wire, we treated the lesion with the active wire.  The patient received 20 Wallace Cullens of radiation, and the total treatment period was I believe 380 seconds.  The centering balloon was then removed and repeat diagnostic studies were performed through the guiding catheter.  The patient tolerated the procedure well and left the laboratory in satisfactory condition.  RESULTS:  Initially there were tandem stenoses within the stent in the proximal to mid LAD.  The first stenosis was 95% and the second stenosis was 90%.  Following treatment, the first stenosis improved from 95% to 20%, and the second stenosis improved from 90% to 10%.  CONCLUSION:  Successful cutting balloon angioplasty for in-stent restenosis within the proximal to mid-LAD, followed by Brachy therapy, with improvement in the percent diameter narrowing in the first lesion from 95% to 20%, and improvement in the second lesion from 90% to 10%.  DISPOSITION:  The patient was returned to the post-angioplasty unit for further observation. Dictated by:   Everardo Beals Juanda Chance, M.D. [facility] Attending Physician:  Mirian Mo DD:  09/20/01 TD:  09/20/01 Job: [id number]

## 2010-10-24 NOTE — Consult Note (Signed)
[place]. [facility]  Patient:    AMERICO, VALLERY Visit Number: [id number] MRN: [id number]          Service Type: MED Location: [phone] Attending Physician:  Mirian Mo Dictated by:   [name], M.D. Proc. Date: 09/21/01 Admit Date:  09/16/2001 Discharge Date: 09/20/2001   CC:         Thomas C. Wall, M.D. [facility]   Consultation Report  REASON FOR CONSULTATION:  This patient, age 77, developed angina and was admitted September 16, 2001 to Dr. Anola Gurney service and underwent angioplasties and developed urinary retention requiring Foley catheter. The patient was referred for urologic consultation and review of his history sheet reveals that he had urinary retention in 1996 following coronary bypass surgery, but when most recently seen in our office, June 20, 2001, his residual urine was less than one ounce, and his ultrasound revealed bilateral peripelvic cysts which were stable. There was no hydronephrosis right or left, and his urinalysis was normal at that time.  PHYSICAL EXAMINATION:  ABDOMEN:  Flat. Liver, kidney, spleen, masses, tenderness not detected. He has a left inguinal hernia.  GENITOURINARY:  He has bruises in the groins, and he has a Foley catheter in place. The meatus is slightly irritated. The penis is otherwise normal and circumcised. The testes are good size, symmetrical. Scrotum, anus, perineum normal.  RECTAL:  Tone good. Prostate is 20-25 grams, soft, tender, smooth, bifid.  MEDICATIONS:  1. Darvocet-N 100.  2. Integrilin.  3. Valium.  4. Aspirin.  5. Isosorbide.  6. Zocor.  7. Lotensin.  8. Colace.  9. Protonix. 10. Plavix.  DIAGNOSES:  1. Urinary retention secondary to benign prostatic hypertrophy and     medications.  2. Bilateral peripelvic cysts -- stable.  3. Coronary bypass surgery in 1996.  4. Recent angioplasty with stents.  5. Hypertension.  6. Bronchitis.  7. Left inguinal  hernia.  PLAN:  1. Will start him with Proscar and Flomax and give him a trial with removing     his catheter when this is agreeable with Dr. Daleen Squibb.  2. Follow him closely in the office. Dictated by:   [name], M.D. Attending Physician:  Mirian Mo DD:  09/21/01 TD:  09/22/01 Job: [id number]

## 2010-12-01 ENCOUNTER — Other Ambulatory Visit: Payer: Self-pay | Admitting: Cardiology

## 2010-12-03 ENCOUNTER — Other Ambulatory Visit: Payer: Self-pay | Admitting: *Deleted

## 2010-12-03 MED ORDER — BENAZEPRIL HCL 5 MG PO TABS: 5.0000 mg | ORAL_TABLET | Freq: Every day | ORAL | Status: DC

## 2011-02-17 ENCOUNTER — Telehealth: Payer: Self-pay | Admitting: Cardiology

## 2011-02-17 NOTE — Telephone Encounter (Signed)
Walk in Pt Form " Pt Dropped off Cholesterol Screening form to be completed" sent to Debbie/Wall  02/17/11/km

## 2011-03-18 ENCOUNTER — Other Ambulatory Visit: Payer: Self-pay | Admitting: Cardiology

## 2011-06-30 ENCOUNTER — Telehealth: Payer: Self-pay | Admitting: Cardiology

## 2011-06-30 DIAGNOSIS — E785 Hyperlipidemia, unspecified: Secondary | ICD-10-CM

## 2011-06-30 NOTE — Telephone Encounter (Signed)
New problem Pt wants to know if he can get blood work before he is to see Dr wall- He thinks he needs cholesterol but doesn't know what else. Please call

## 2011-06-30 NOTE — Telephone Encounter (Signed)
Pt called app set

## 2011-07-02 ENCOUNTER — Other Ambulatory Visit: Payer: Self-pay | Admitting: *Deleted

## 2011-07-03 ENCOUNTER — Encounter: Payer: Self-pay | Admitting: *Deleted

## 2011-07-07 ENCOUNTER — Ambulatory Visit (INDEPENDENT_AMBULATORY_CARE_PROVIDER_SITE_OTHER): Payer: Medicare Other | Admitting: *Deleted

## 2011-07-07 DIAGNOSIS — E785 Hyperlipidemia, unspecified: Secondary | ICD-10-CM

## 2011-07-07 DIAGNOSIS — I10 Essential (primary) hypertension: Secondary | ICD-10-CM

## 2011-07-07 LAB — HEPATIC FUNCTION PANEL
ALT: 17 U/L (ref 0–53)
AST: 22 U/L (ref 0–37)
Alkaline Phosphatase: 47 U/L (ref 39–117)
Bilirubin, Direct: 0.1 mg/dL (ref 0.0–0.3)
Total Bilirubin: 0.7 mg/dL (ref 0.3–1.2)

## 2011-07-07 LAB — BASIC METABOLIC PANEL
BUN: 26 mg/dL — ABNORMAL HIGH (ref 6–23)
CO2: 29 mEq/L (ref 19–32)
Calcium: 8.8 mg/dL (ref 8.4–10.5)
Creatinine, Ser: 1.1 mg/dL (ref 0.4–1.5)
GFR: 68.37 mL/min (ref >60.00)
Glucose, Bld: 95 mg/dL (ref 70–99)
Sodium: 142 mEq/L (ref 135–145)

## 2011-07-07 LAB — LIPID PANEL: Total CHOL/HDL Ratio: 3

## 2011-07-09 ENCOUNTER — Ambulatory Visit (INDEPENDENT_AMBULATORY_CARE_PROVIDER_SITE_OTHER): Payer: Medicare Other | Admitting: Cardiology

## 2011-07-09 ENCOUNTER — Encounter: Payer: Self-pay | Admitting: Cardiology

## 2011-07-09 VITALS — BP 132/64 | HR 56 | Ht 70.0 in | Wt 135.0 lb

## 2011-07-09 DIAGNOSIS — E785 Hyperlipidemia, unspecified: Secondary | ICD-10-CM

## 2011-07-09 DIAGNOSIS — R0602 Shortness of breath: Secondary | ICD-10-CM

## 2011-07-09 DIAGNOSIS — I2510 Atherosclerotic heart disease of native coronary artery without angina pectoris: Secondary | ICD-10-CM | POA: Insufficient documentation

## 2011-07-09 DIAGNOSIS — Z951 Presence of aortocoronary bypass graft: Secondary | ICD-10-CM

## 2011-07-09 MED ORDER — ASPIRIN 81 MG PO TABS: 81.0000 mg | ORAL_TABLET | Freq: Every day | ORAL | Status: DC

## 2011-07-09 NOTE — Progress Notes (Signed)
HPI [name] comes in today for evaluation and management of his history of coronary artery disease and bypass surgery in the mid 90s. He is doing remarkably well and is extremely health-conscious. He is very compliant with his medications.  Recent labs were obtained and his lipids are at goal. LFTs are normal. I reviewed these with him today.  He denies any angina, ischemic symptoms, orthopnea, PND, palpitations, edema. He does have some mild dyspnea on exertion which is baseline which he attributes to his severe pectus excavatum.  Past Medical History  Diagnosis Date  . Acute myocardial infarction of anterolateral Louis Reid, episode of care unspecified   . Coronary atherosclerosis of artery bypass graft   . Sinoatrial node dysfunction   . Unspecified essential hypertension   . Other and unspecified hyperlipidemia     Current Outpatient Prescriptions  Medication Sig Dispense Refill  . alendronate (FOSAMAX) 70 MG tablet Take 70 mg by mouth every 7 (seven) days. Take with a full glass of water on an empty stomach.      Marland Kitchen aspirin 81 MG tablet Take 1 tablet (81 mg total) by mouth daily.      . benazepril (LOTENSIN) 20 MG tablet       . docusate sodium (COLACE) 100 MG capsule Take 100 mg by mouth daily as needed.      . multivitamin (THERAGRAN) per tablet Take 1 tablet by mouth daily.      . simvastatin (ZOCOR) 20 MG tablet TAKE 1 TABLET BY MOUTH AT BEDTIME  90 tablet  1  . Wheat Dextrin (BENEFIBER PO) Take by mouth as needed.        Allergies  Allergen Reactions  . Cold Medicine Plus (Sine-Off)     COLD MEDICATIONS MAKE HIM "LOOPY"    No family history on file.  History   Social History  . Marital Status: Widowed    Spouse Name: N/A    Number of Children: N/A  . Years of Education: N/A   Occupational History  . Not on file.   Social History Main Topics  . Smoking status: Former Games developer  . Smokeless tobacco: Never Used   Comment: quit in the 1960's  . Alcohol Use: No  . Drug  Use: No  . Sexually Active: Not on file   Other Topics Concern  . Not on file   Social History Narrative  . No narrative on file    ROS ALL NEGATIVE EXCEPT THOSE NOTED IN HPI  PE  General Appearance: well developed, well nourished in no acute distress HEENT: symmetrical face, PERRLA, good dentition  Neck: no JVD, thyromegaly, or adenopathy, trachea midline Chest: severe pectus excavatum Cardiac: PMI non-displaced, RRR, normal S1, S2, no gallop or murmur Lung: clear to ausculation and percussion Vascular: all pulses full without bruits  Abdominal: nondistended, nontender, good bowel sounds, no HSM, no bruits Extremities: no cyanosis, clubbing or edema, no sign of DVT, no varicosities  Skin: normal color, no rashes Neuro: alert and oriented x 3, non-focal Pysch: normal affect  EKG Sinus bradycardia, RSR prime, no change since previous ECG. BMET   Lipid Panel     Component Value Date/Time   CHOL 136 07/07/2011 0851   TRIG 83.0 07/07/2011 0851   HDL 48.70 07/07/2011 0851   CHOLHDL 3 07/07/2011 0851   VLDL 16.6 07/07/2011 0851   LDLCALC 71 07/07/2011 0851    CBC No results found for this basename: wbc, rbc, hgb, hct, plt, mcv, mch, mchc, rdw, neutrabs, lymphsabs, monoabs,  eosabs, basosabs

## 2011-07-09 NOTE — Patient Instructions (Signed)
Your physician has recommended you make the following change in your medication: decrease aspirin to 81 mg daily  Your physician wants you to follow-up in: 1 year with dr wall You will receive a reminder letter in the mail two months in advance. If you don't receive a letter, please call our office to schedule the follow-up appointment.

## 2011-07-09 NOTE — Assessment & Plan Note (Signed)
At goal as needed. Repeat labs in one year.

## 2011-07-09 NOTE — Assessment & Plan Note (Signed)
Stable. Continue current secondary preventive therapy.

## 2011-07-24 NOTE — Assessment & Plan Note (Signed)
Stable. Continue therapy. 

## 2011-09-18 ENCOUNTER — Other Ambulatory Visit: Payer: Self-pay | Admitting: Cardiology

## 2011-11-24 ENCOUNTER — Telehealth: Payer: Self-pay | Admitting: Cardiology

## 2011-11-24 MED ORDER — BENAZEPRIL HCL 10 MG PO TABS: 10.0000 mg | ORAL_TABLET | Freq: Every day | ORAL | Status: DC

## 2011-11-24 NOTE — Telephone Encounter (Signed)
[facility] 10 mg so he doesn't have to cut pills in half

## 2011-11-24 NOTE — Telephone Encounter (Signed)
Spoke with pharm, according to the pt he takes lotensin 10 mg once daily. Okay given for refills.

## 2011-11-25 ENCOUNTER — Telehealth: Payer: Self-pay | Admitting: Internal Medicine

## 2011-11-25 NOTE — Telephone Encounter (Signed)
Patient returning nurse call, he does not have a answering machine but can be reached at [phone]

## 2011-11-25 NOTE — Telephone Encounter (Signed)
Attempted to call pt, phone rings not answer service.

## 2011-11-26 NOTE — Telephone Encounter (Signed)
[facility] aware that [facility] will [facility]  Lotensin  10 mg dose instead of 20 mg. Patient verbalized understanding.

## 2011-12-29 ENCOUNTER — Encounter: Payer: Self-pay | Admitting: Internal Medicine

## 2012-01-27 ENCOUNTER — Ambulatory Visit (INDEPENDENT_AMBULATORY_CARE_PROVIDER_SITE_OTHER): Payer: Medicare Other | Admitting: Internal Medicine

## 2012-01-27 ENCOUNTER — Encounter: Payer: Self-pay | Admitting: Internal Medicine

## 2012-01-27 ENCOUNTER — Other Ambulatory Visit (INDEPENDENT_AMBULATORY_CARE_PROVIDER_SITE_OTHER): Payer: Medicare Other

## 2012-01-27 VITALS — BP 108/64 | HR 72 | Ht 70.75 in | Wt 133.0 lb

## 2012-01-27 DIAGNOSIS — Z800 Family history of malignant neoplasm of digestive organs: Secondary | ICD-10-CM

## 2012-01-27 DIAGNOSIS — R634 Abnormal weight loss: Secondary | ICD-10-CM

## 2012-01-27 DIAGNOSIS — K5900 Constipation, unspecified: Secondary | ICD-10-CM

## 2012-01-27 DIAGNOSIS — K5909 Other constipation: Secondary | ICD-10-CM

## 2012-01-27 DIAGNOSIS — IMO0001 Reserved for inherently not codable concepts without codable children: Secondary | ICD-10-CM

## 2012-01-27 DIAGNOSIS — Z1211 Encounter for screening for malignant neoplasm of colon: Secondary | ICD-10-CM

## 2012-01-27 DIAGNOSIS — R141 Gas pain: Secondary | ICD-10-CM

## 2012-01-27 LAB — CBC WITH DIFFERENTIAL/PLATELET
Basophils Relative: 0.6 % (ref 0.0–3.0)
Eosinophils Relative: 0.5 % (ref 0.0–5.0)
HCT: 37.6 % — ABNORMAL LOW (ref 39.0–52.0)
Hemoglobin: 12.3 g/dL — ABNORMAL LOW (ref 13.0–17.0)
Lymphs Abs: 0.8 10*3/uL (ref 0.7–4.0)
MCV: 97.5 fl (ref 78.0–100.0)
Monocytes Absolute: 0.5 10*3/uL (ref 0.1–1.0)
Neutro Abs: 3.5 10*3/uL (ref 1.4–7.7)
RBC: 3.86 Mil/uL — ABNORMAL LOW (ref 4.22–5.81)
WBC: 4.9 10*3/uL (ref 4.5–10.5)

## 2012-01-27 LAB — COMPREHENSIVE METABOLIC PANEL
Albumin: 3.8 g/dL (ref 3.5–5.2)
Alkaline Phosphatase: 53 U/L (ref 39–117)
BUN: 23 mg/dL (ref 6–23)
Creatinine, Ser: 1 mg/dL (ref 0.4–1.5)
Glucose, Bld: 80 mg/dL (ref 70–99)
Potassium: 4.2 mEq/L (ref 3.5–5.1)
Total Bilirubin: 0.8 mg/dL (ref 0.3–1.2)

## 2012-01-27 LAB — IGA: IgA: 214 mg/dL (ref 68–378)

## 2012-01-27 NOTE — Progress Notes (Signed)
Subjective:    Patient ID: Louis Reid, male    DOB: Feb 05, 1933, 78 y.o.   MRN: [id number] Referred by: Louis Reid, * HPI This is an extremely pleasant elderly white man here to discuss gastrointestinal disturbances. He has been having what he describes a lot of gas, really is borborygmi. There is no associated pain but he has a lot of ling and the upper and mid abdomen area. This is been going on for months. He wondered if that was not related to Fosamax, and under the advice of his primary care physician this was held but it did not seem to make a difference. He had continued to hold that as he recently had a hold it because he needed some dental work done. He has chronic constipation and uses a stool softener and intermittent prune juice with relief. He had anorexia and loss of weight, but since starting sertraline for depression that has improved as has his mood. He also describes increased flatulence.  His GI review of systems is otherwise negative, there is no report of bleeding though last year he had a single episode of rectal bleeding and tells me an anoscopy he had hemorrhoids. He also has had annual stool test for occult blood and tells me they have always been negative.  Allergies  Allergen Reactions  . Cold Medicine Plus (Chlorphen-Pseudoephed-Apap)     COLD MEDICATIONS MAKE HIM "LOOPY"   Outpatient Prescriptions Prior to Visit  Medication Sig Dispense Refill  . aspirin 81 MG tablet Take 1 tablet (81 mg total) by mouth daily.      . benazepril (LOTENSIN) 10 MG tablet Take 1 tablet (10 mg total) by mouth daily.  90 tablet  3  . docusate sodium (COLACE) 100 MG capsule Take 100 mg by mouth daily as needed.      . multivitamin (THERAGRAN) per tablet Take 1 tablet by mouth daily.      . simvastatin (ZOCOR) 20 MG tablet TAKE 1 TABLET BY MOUTH AT BEDTIME  90 tablet  3  . alendronate (FOSAMAX) 70 MG tablet Take 70 mg by mouth every 7 (seven) days. Take with a full glass of  water on an empty stomach.      . Wheat Dextrin (BENEFIBER PO) Take by mouth as needed.       Past Medical History  Diagnosis Date  . Acute myocardial infarction of anterolateral wall, episode of care unspecified   . Coronary atherosclerosis of artery bypass graft   . Sinoatrial node dysfunction   . HTN (hypertension)   . HLD (hyperlipidemia)   . Depression   . History of kidney stones   . Congenital funnel chest   . Inguinal hernia   . Osteoporosis    Past Surgical History  Procedure Date  . Coronary artery bypass graft 1996  . Inguinal hernia repair 1996    right, hx  . Transurethral resection of prostate   . Ptca     percutaneous transluminal coronary intervention and brachy therapy, Louis R. Juanda Chance, MD. EF 60%   History   Social History  . Marital Status: Widowed    Spouse Name: N/A    Number of Children: 1  . Years of Education: N/A   Occupational History  . Louis Reid    Social History Main Topics  . Smoking status: Former Games developer  . Smokeless tobacco: Never Used   Comment: quit in the 1960's  . Alcohol Use: No  . Drug Use: No  .  Social History Narrative  .  widowed, retired Civil engineer, contracting Reid. 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 Lives alone.    Family History  Problem Relation Age of Onset  . Colon cancer Brother   . Diabetes Brother   . Heart disease Father   . Heart disease Brother   . Other Mother     brain tumor       Review of Systems This is positive for improving depression, muscle cramps. All other review of systems are negative or as per history of present illness.   Objective:   Physical Exam General:  Thin but Well-developed, well-nourished and in no acute distress Eyes:  anicteric. bilateral arcus ENT:   Mouth and posterior pharynx free of lesions.  Neck:   supple w/o thyromegaly or mass.  Lungs: Clear to auscultation bilaterally. Chest wall as a market pectus excavatum deformity with scars from prior  coronary artery bypass grafting Heart:  S1S2, no rubs, murmurs, gallops. Abdomen:  soft, non-tender, no hepatosplenomegaly, or mass and BS+. No bruits. There is a moderate left inguinal hernia, direct easily reducible. Right inguinal hernia repair scars noted. Rectal: Deferred until colonoscopy Lymph:  no cervical or supraclavicular adenopathy. Extremities:   no edema Skin   no rash. Neuro:  A&O x 3.  Psych:  appropriate mood and  Affect.   Data Reviewed:   Chemistry      Component Value Date/Time   NA 142 07/07/2011 0851   K 4.1 07/07/2011 0851   CL 107 07/07/2011 0851   CO2 29 07/07/2011 0851   BUN 26* 07/07/2011 0851   CREATININE 1.1 07/07/2011 0851      Component Value Date/Time   CALCIUM 8.8 07/07/2011 0851   ALKPHOS 47 07/07/2011 0851   AST 22 07/07/2011 0851   ALT 17 07/07/2011 0851   BILITOT 0.7 07/07/2011 0851         Assessment & Plan:   1. Gas   2. Weight loss   3. Special screening for malignant neoplasms, colon   4. Family history of malignant neoplasm of gastrointestinal tract   5. Chronic constipation   1. I'm not certain as to the cause of gas and borborygmi problems. Given his small stature, osteoporosis and the symptoms, I do wonder about celiac disease. He has never had a colonoscopy, and though routine Hemoccults are negative that is a reasonable option. He is actually not at increased risk of colon cancer that much as his brothers colon cancer was at an advanced age. However at least a one-time screening colonoscopy seems reasonable.The risks and benefits as well as alternatives of endoscopic procedure(s) have been discussed and reviewed. All questions answered. The patient agrees to proceed. Given the gas and questions about Fosamax, we have decided to schedule an upper endoscopy as well. 2. CBC a comprehensive metabolic check as well.  I appreciate the opportunity care of this patient.  CC: Louis Mask, MD

## 2012-01-27 NOTE — Patient Instructions (Addendum)
You have been scheduled for a colonoscopy with propofol. Please follow written instructions given to you at your visit today.  Please pick up your prep kit at the pharmacy within the next 1-3 days. If you use inhalers (even only as needed), please bring them with you on the day of your procedure.  Your physician has requested that you go to the basement for the following lab work before leaving today: CBC, CMET, IGA, TTG  Thank you for choosing me and [facility].  Iva Boop, M.D., [facility]

## 2012-01-28 ENCOUNTER — Telehealth: Payer: Self-pay | Admitting: Internal Medicine

## 2012-01-28 LAB — TISSUE TRANSGLUTAMINASE, IGA: Tissue Transglutaminase Ab, IgA: 3.1 U/mL (ref <20)

## 2012-01-28 MED ORDER — MOVIPREP 100 G PO SOLR: ORAL | Status: DC

## 2012-01-28 NOTE — Telephone Encounter (Signed)
Spoke to pt and apologized for [facility].  He said no problem he will pick it up Sunday when in town.

## 2012-02-01 NOTE — Progress Notes (Signed)
Tried to reach patient by phone twice today, AM and PM. No answer and no machine.  Will try again later.

## 2012-02-09 ENCOUNTER — Other Ambulatory Visit: Payer: Self-pay

## 2012-02-09 MED ORDER — NA SULFATE-K SULFATE-MG SULF 17.5-3.13-1.6 GM/177ML PO SOLN: ORAL | Status: DC

## 2012-02-09 NOTE — Telephone Encounter (Signed)
Moviprep Rx'ed by mistake, suprep sent in today.  Per pharmacy pt may exchange .  Pt informed and on his way to pick up.

## 2012-02-11 ENCOUNTER — Encounter: Payer: Self-pay | Admitting: Internal Medicine

## 2012-02-11 ENCOUNTER — Ambulatory Visit (AMBULATORY_SURGERY_CENTER): Payer: Medicare Other | Admitting: Internal Medicine

## 2012-02-11 VITALS — BP 152/75 | HR 62 | Temp 97.8°F | Resp 16 | Ht 70.0 in | Wt 133.0 lb

## 2012-02-11 DIAGNOSIS — Z1211 Encounter for screening for malignant neoplasm of colon: Secondary | ICD-10-CM

## 2012-02-11 DIAGNOSIS — R634 Abnormal weight loss: Secondary | ICD-10-CM

## 2012-02-11 DIAGNOSIS — D130 Benign neoplasm of esophagus: Secondary | ICD-10-CM

## 2012-02-11 MED ORDER — SODIUM CHLORIDE 0.9 % IV SOLN: 500.0000 mL | INTRAVENOUS | Status: DC

## 2012-02-11 NOTE — Op Note (Signed)
[facility] [address] [place], [address]   ENDOSCOPY PROCEDURE REPORT  PATIENT: Louis, Reid  MR#: [id number] BIRTHDATE: 01-19-1933 , 79  yrs. old GENDER: Male ENDOSCOPIST: Iva Boop, MD, Clementeen Graham REFERRED BY:  Windle Guard, M.D. PROCEDURE DATE:  02/11/2012 PROCEDURE:  EGD w/ biopsy ASA CLASS:     Class II INDICATIONS:  weight loss. MEDICATIONS: Propofol (Diprivan) 80 mg IV, MAC sedation, administered by CRNA, and These medications were titrated to patient response per physician's verbal order TOPICAL ANESTHETIC: Cetacaine Spray  DESCRIPTION OF PROCEDURE: After the risks benefits and alternatives of the procedure were thoroughly explained, informed consent was obtained.  The LB GIF-H180 [id number] endoscope was introduced through the mouth and advanced to the second portion of the duodenum. Without limitations.  The instrument was slowly withdrawn as the mucosa was fully examined.      ESOPHAGUS: Mild esophagitis was suspected in the middle third of the esophagus. Irregular mucosa with suspected mucosal disr Multiple biopsies were performed using cold forceps.  Sample sent for histology.  STOMACH: The mucosa of the stomach appeared normal.  DUODENUM: The duodenal mucosa showed no abnormalities in the bulb and second portion of the duodenum.  Retroflexed views revealed no abnormalities.     The scope was then withdrawn from the patient and the procedure completed.  COMPLICATIONS: There were no complications. ENDOSCOPIC IMPRESSION: 1.   ? of Esophagitis in the middle third of the esophagus; multiple biopsies 2.   The mucosa of the stomach appeared normal 3.   The duodenal mucosa showed no abnormalities in the bulb and second portion of the duodenum  RECOMMENDATIONS: 1.  await biopsy results 2.  Office will call with results 3.  Proceed with a Colonoscopy.  eSigned:  Iva Boop, MD, [facility] 02/11/2012 3:23 PM   XB:JYNWGN Jeannetta Nap, MD and  The Patient  PATIENT NAME:  Louis, Reid MR#: [id number]

## 2012-02-11 NOTE — Patient Instructions (Addendum)
Your upper endoscopy exam showed possible inflammation in the esophagus. I took biopsies.  The colonoscopy was normal.  I will let you know the results and recommendations through a phone call in 1-2 weeks.  Thank you for choosing me and [facility].  Iva Boop, MD, [facility] Discharge instructions given with verbal understanding. Handout on esophagitis given. Resume previous medications. YOU HAD AN ENDOSCOPIC PROCEDURE TODAY AT THE [facility]: Refer to the procedure report that was given to you for any specific questions about what was found during the examination.  If the procedure report does not answer your questions, please call your gastroenterologist to clarify.  If you requested that your care partner not be given the details of your procedure findings, then the procedure report has been included in a sealed envelope for you to review at your convenience later.  YOU SHOULD EXPECT: Some feelings of bloating in the abdomen. Passage of more gas than usual.  Walking can help get rid of the air that was put into your GI tract during the procedure and reduce the bloating. If you had a lower endoscopy (such as a colonoscopy or flexible sigmoidoscopy) you may notice spotting of blood in your stool or on the toilet paper. If you underwent a bowel prep for your procedure, then you may not have a normal bowel movement for a few days.  DIET: Your first meal following the procedure should be a light meal and then it is ok to progress to your normal diet.  A half-sandwich or bowl of soup is an example of a good first meal.  Heavy or fried foods are harder to digest and may make you feel nauseous or bloated.  Likewise meals heavy in dairy and vegetables can cause extra gas to form and this can also increase the bloating.  Drink plenty of fluids but you should avoid alcoholic beverages for 24 hours.  ACTIVITY: Your care partner should take you home directly after the procedure.   You should plan to take it easy, moving slowly for the rest of the day.  You can resume normal activity the day after the procedure however you should NOT DRIVE or use heavy machinery for 24 hours (because of the sedation medicines used during the test).    SYMPTOMS TO REPORT IMMEDIATELY: A gastroenterologist can be reached at any hour.  During normal business hours, 8:30 AM to 5:00 PM Monday through Friday, call [phone].  After hours and on weekends, please call the GI answering service at [phone] who will take a message and have the physician on call contact you.   Following lower endoscopy (colonoscopy or flexible sigmoidoscopy):  Excessive amounts of blood in the stool  Significant tenderness or worsening of abdominal pains  Swelling of the abdomen that is new, acute  Fever of 100F or higher  Following upper endoscopy (EGD)  Vomiting of blood or coffee ground material  New chest pain or pain under the shoulder blades  Painful or persistently difficult swallowing  New shortness of breath  Fever of 100F or higher  Black, tarry-looking stools  FOLLOW UP: If any biopsies were taken you will be contacted by phone or by letter within the next 1-3 weeks.  Call your gastroenterologist if you have not heard about the biopsies in 3 weeks.  Our staff will call the home number listed on your records the next business day following your procedure to check on you and address any questions or concerns that you  may have at that time regarding the information given to you following your procedure. This is a courtesy call and so if there is no answer at the home number and we have not heard from you through the emergency physician on call, we will assume that you have returned to your regular daily activities without incident.  SIGNATURES/CONFIDENTIALITY: You and/or your care partner have signed paperwork which will be entered into your electronic medical record.  These signatures attest  to the fact that that the information above on your After Visit Summary has been reviewed and is understood.  Full responsibility of the confidentiality of this discharge information lies with you and/or your care-partner.

## 2012-02-11 NOTE — Op Note (Signed)
[facility] [address] [place], [address]   COLONOSCOPY PROCEDURE REPORT  PATIENT: Louis Reid, Louis Reid  MR#: [id number] BIRTHDATE: 03-25-1933 , 79  yrs. old GENDER: Male ENDOSCOPIST: Iva Boop, MD, [facility] REFERRED NW:GNFAOZ Jeannetta Nap, M.D. PROCEDURE DATE:  02/11/2012 PROCEDURE:   Colonoscopy, screening ASA CLASS:   Class II INDICATIONS:average risk screening. MEDICATIONS: There was residual sedation effect present from prior procedure, Propofol (Diprivan) 80 mg IV, MAC sedation, administered by CRNA, and These medications were titrated to patient response per physician's verbal order  DESCRIPTION OF PROCEDURE:   After the risks benefits and alternatives of the procedure were thoroughly explained, informed consent was obtained.  A digital rectal exam revealed no abnormalities of the rectum.   The LB CF-H180AL [id number]  endoscope was introduced through the anus and advanced to the cecum, which was identified by both the appendix and ileocecal valve. No adverse events experienced.   The quality of the prep was excellent, using MoviPrep  The instrument was then slowly withdrawn as the colon was fully examined.      COLON FINDINGS: A normal appearing cecum, ileocecal valve, and appendiceal orifice were identified.  The ascending, hepatic flexure, transverse, splenic flexure, descending, sigmoid colon and rectum appeared unremarkable.  No polyps or cancers were seen. Retroflexed views revealed no abnormalities. The time to cecum=3 minutes 03 seconds.  Withdrawal time=9 minutes 30 seconds.  The scope was withdrawn and the procedure completed. COMPLICATIONS: There were no complications.  ENDOSCOPIC IMPRESSION: Normal colonosopy with excellent prep  RECOMMENDATIONS: Repeat colonoscopy if signs/symptoms warrant - not routinely  eSigned:  Iva Boop, MD, [facility] 02/11/2012 3:27 PM   cc: Windle Guard, MD and The Patient

## 2012-02-12 ENCOUNTER — Telehealth: Payer: Self-pay | Admitting: *Deleted

## 2012-02-12 NOTE — Telephone Encounter (Signed)
  Follow up Call-  Call back number 02/11/2012  Post procedure Call Back phone  # [phone]  Permission to leave phone message Yes     Patient questions:  Do you have a fever, pain , or abdominal swelling? no Pain Score  0 *  Have you tolerated food without any problems? yes  Have you been able to return to your normal activities? yes  Do you have any questions about your discharge instructions: Diet   no Medications  no Follow up visit  no  Do you have questions or concerns about your Care? no  Actions: * If pain score is 4 or above: No action needed, pain <4.

## 2012-02-17 NOTE — Progress Notes (Signed)
Quick Note:  Office  Please call and let him know the esophageal biopsies did not show any problems - thought he might have esophagitis but does not show any inflammation  1) Use MiraLax daily if constipation is still problematic 2) Take align 1 each day for the bloating and gas - take x 1 month and stop - if it helped and sxs recur restart and can take chronically 3) I think ok to retry Fosamax 4) If still having problems after 2-3 months come back to me  Please copy his PCP on these notes  [facility]  No letter and no recall ______

## 2012-04-08 DIAGNOSIS — K56609 Unspecified intestinal obstruction, unspecified as to partial versus complete obstruction: Secondary | ICD-10-CM

## 2012-04-08 HISTORY — DX: Unspecified intestinal obstruction, unspecified as to partial versus complete obstruction: K56.609

## 2012-04-24 ENCOUNTER — Encounter (HOSPITAL_COMMUNITY): Payer: Self-pay | Admitting: *Deleted

## 2012-04-24 ENCOUNTER — Emergency Department (HOSPITAL_COMMUNITY): Payer: Medicare Other

## 2012-04-24 ENCOUNTER — Inpatient Hospital Stay (HOSPITAL_COMMUNITY)
Admission: EM | Admit: 2012-04-24 | Discharge: 2012-05-09 | DRG: 335 | Disposition: A | Discharge status: Home / Self Care | Payer: Medicare Other | Attending: General Surgery | Admitting: General Surgery

## 2012-04-24 DIAGNOSIS — I252 Old myocardial infarction: Secondary | ICD-10-CM

## 2012-04-24 DIAGNOSIS — E43 Unspecified severe protein-calorie malnutrition: Secondary | ICD-10-CM | POA: Diagnosis present

## 2012-04-24 DIAGNOSIS — K5909 Other constipation: Secondary | ICD-10-CM | POA: Diagnosis present

## 2012-04-24 DIAGNOSIS — Z681 Body mass index (BMI) 19 or less, adult: Secondary | ICD-10-CM

## 2012-04-24 DIAGNOSIS — K2970 Gastritis, unspecified, without bleeding: Secondary | ICD-10-CM | POA: Diagnosis not present

## 2012-04-24 DIAGNOSIS — K56609 Unspecified intestinal obstruction, unspecified as to partial versus complete obstruction: Secondary | ICD-10-CM

## 2012-04-24 DIAGNOSIS — E785 Hyperlipidemia, unspecified: Secondary | ICD-10-CM | POA: Diagnosis present

## 2012-04-24 DIAGNOSIS — K5710 Diverticulosis of small intestine without perforation or abscess without bleeding: Secondary | ICD-10-CM | POA: Diagnosis present

## 2012-04-24 DIAGNOSIS — K2990 Gastroduodenitis, unspecified, without bleeding: Secondary | ICD-10-CM | POA: Diagnosis not present

## 2012-04-24 DIAGNOSIS — L989 Disorder of the skin and subcutaneous tissue, unspecified: Secondary | ICD-10-CM | POA: Diagnosis present

## 2012-04-24 DIAGNOSIS — Z7982 Long term (current) use of aspirin: Secondary | ICD-10-CM

## 2012-04-24 DIAGNOSIS — F329 Major depressive disorder, single episode, unspecified: Secondary | ICD-10-CM | POA: Diagnosis present

## 2012-04-24 DIAGNOSIS — K209 Esophagitis, unspecified without bleeding: Secondary | ICD-10-CM | POA: Diagnosis not present

## 2012-04-24 DIAGNOSIS — I25810 Atherosclerosis of coronary artery bypass graft(s) without angina pectoris: Secondary | ICD-10-CM | POA: Diagnosis present

## 2012-04-24 DIAGNOSIS — Z79899 Other long term (current) drug therapy: Secondary | ICD-10-CM

## 2012-04-24 DIAGNOSIS — F3289 Other specified depressive episodes: Secondary | ICD-10-CM | POA: Diagnosis present

## 2012-04-24 DIAGNOSIS — I10 Essential (primary) hypertension: Secondary | ICD-10-CM | POA: Diagnosis present

## 2012-04-24 DIAGNOSIS — R1310 Dysphagia, unspecified: Secondary | ICD-10-CM

## 2012-04-24 DIAGNOSIS — M810 Age-related osteoporosis without current pathological fracture: Secondary | ICD-10-CM | POA: Diagnosis present

## 2012-04-24 DIAGNOSIS — Z87891 Personal history of nicotine dependence: Secondary | ICD-10-CM

## 2012-04-24 DIAGNOSIS — K565 Intestinal adhesions [bands], unspecified as to partial versus complete obstruction: Principal | ICD-10-CM | POA: Diagnosis present

## 2012-04-24 DIAGNOSIS — I495 Sick sinus syndrome: Secondary | ICD-10-CM | POA: Diagnosis present

## 2012-04-24 DIAGNOSIS — Z9861 Coronary angioplasty status: Secondary | ICD-10-CM

## 2012-04-24 LAB — HEPATIC FUNCTION PANEL
AST: 23 U/L (ref 0–37)
Albumin: 4 g/dL (ref 3.5–5.2)
Total Protein: 6.8 g/dL (ref 6.0–8.3)

## 2012-04-24 LAB — CBC WITH DIFFERENTIAL/PLATELET
Eosinophils Relative: 0 % (ref 0–5)
Hemoglobin: 13.3 g/dL (ref 13.0–17.0)
Lymphocytes Relative: 6 % — ABNORMAL LOW (ref 12–46)
Lymphs Abs: 0.7 10*3/uL (ref 0.7–4.0)
MCV: 95.5 fL (ref 78.0–100.0)
Neutrophils Relative %: 89 % — ABNORMAL HIGH (ref 43–77)
Platelets: 170 10*3/uL (ref 150–400)
RBC: 4.18 MIL/uL — ABNORMAL LOW (ref 4.22–5.81)
WBC: 12.3 10*3/uL — ABNORMAL HIGH (ref 4.0–10.5)

## 2012-04-24 LAB — POCT I-STAT, CHEM 8
BUN: 25 mg/dL — ABNORMAL HIGH (ref 6–23)
Chloride: 104 mEq/L (ref 96–112)
Creatinine, Ser: 1.1 mg/dL (ref 0.50–1.35)
Glucose, Bld: 106 mg/dL — ABNORMAL HIGH (ref 70–99)
HCT: 40 % (ref 39.0–52.0)
Potassium: 4.3 mEq/L (ref 3.5–5.1)

## 2012-04-24 LAB — LIPASE, BLOOD: Lipase: 29 U/L (ref 11–59)

## 2012-04-24 LAB — URINALYSIS, ROUTINE W REFLEX MICROSCOPIC
Leukocytes, UA: NEGATIVE
Nitrite: NEGATIVE
Specific Gravity, Urine: 1.026 (ref 1.005–1.030)
pH: 6 (ref 5.0–8.0)

## 2012-04-24 MED ORDER — IOHEXOL 300 MG/ML  SOLN
100.0000 mL | Freq: Once | INTRAMUSCULAR | Status: AC | PRN
  Administered 2012-04-24: 100 mL via INTRAVENOUS

## 2012-04-24 MED ORDER — BENAZEPRIL HCL 10 MG PO TABS
10.0000 mg | ORAL_TABLET | Freq: Every day | ORAL | Status: DC
  Administered 2012-04-24 – 2012-04-26 (×3): 10 mg via ORAL
  Filled 2012-04-24 (×5): qty 1

## 2012-04-24 MED ORDER — HYDROMORPHONE HCL PF 1 MG/ML IJ SOLN
1.0000 mg | Freq: Once | INTRAMUSCULAR | Status: AC
  Administered 2012-04-24: 1 mg via INTRAVENOUS
  Filled 2012-04-24: qty 1

## 2012-04-24 MED ORDER — SODIUM CHLORIDE 0.9 % IV SOLN
INTRAVENOUS | Status: DC
  Administered 2012-04-24: 09:00:00 via INTRAVENOUS

## 2012-04-24 MED ORDER — MORPHINE SULFATE 2 MG/ML IJ SOLN
1.0000 mg | INTRAMUSCULAR | Status: DC | PRN
  Administered 2012-04-24 – 2012-04-26 (×6): 2 mg via INTRAVENOUS
  Filled 2012-04-24 (×4): qty 1
  Filled 2012-04-24: qty 2
  Filled 2012-04-24: qty 1

## 2012-04-24 MED ORDER — POTASSIUM CHLORIDE IN NACL 20-0.45 MEQ/L-% IV SOLN
INTRAVENOUS | Status: DC
  Administered 2012-04-24 – 2012-04-25 (×3): via INTRAVENOUS
  Administered 2012-04-26 (×2): 100 mL/h via INTRAVENOUS
  Administered 2012-04-26 – 2012-04-28 (×3): via INTRAVENOUS
  Filled 2012-04-24 (×10): qty 1000

## 2012-04-24 MED ORDER — ASPIRIN EC 81 MG PO TBEC
162.0000 mg | DELAYED_RELEASE_TABLET | Freq: Every day | ORAL | Status: DC
  Administered 2012-04-24 – 2012-04-25 (×2): 81 mg via ORAL
  Administered 2012-04-26: 162 mg via ORAL
  Filled 2012-04-24 (×5): qty 2

## 2012-04-24 MED ORDER — FENTANYL CITRATE 0.05 MG/ML IJ SOLN
50.0000 ug | Freq: Once | INTRAMUSCULAR | Status: AC
  Administered 2012-04-24: 50 ug via INTRAVENOUS
  Filled 2012-04-24: qty 2

## 2012-04-24 MED ORDER — CHLORHEXIDINE GLUCONATE 0.12 % MT SOLN
15.0000 mL | Freq: Two times a day (BID) | OROMUCOSAL | Status: DC
  Administered 2012-04-24 – 2012-05-06 (×19): 15 mL via OROMUCOSAL
  Filled 2012-04-24 (×15): qty 15

## 2012-04-24 MED ORDER — ONDANSETRON HCL 4 MG/2ML IJ SOLN: 4.0000 mg | Freq: Four times a day (QID) | INTRAMUSCULAR | Status: DC | PRN

## 2012-04-24 MED ORDER — ENOXAPARIN SODIUM 40 MG/0.4ML ~~LOC~~ SOLN
40.0000 mg | SUBCUTANEOUS | Status: DC
  Administered 2012-04-24 – 2012-05-08 (×15): 40 mg via SUBCUTANEOUS
  Filled 2012-04-24 (×17): qty 0.4

## 2012-04-24 MED ORDER — SERTRALINE HCL 50 MG PO TABS
50.0000 mg | ORAL_TABLET | Freq: Every day | ORAL | Status: DC
  Administered 2012-04-24 – 2012-04-26 (×3): 50 mg via ORAL
  Filled 2012-04-24 (×5): qty 1

## 2012-04-24 MED ORDER — IOHEXOL 300 MG/ML  SOLN
20.0000 mL | INTRAMUSCULAR | Status: AC
  Administered 2012-04-24: 20 mL via ORAL

## 2012-04-24 MED ORDER — HYDROMORPHONE HCL PF 1 MG/ML IJ SOLN
0.5000 mg | Freq: Once | INTRAMUSCULAR | Status: AC
  Administered 2012-04-24: 0.5 mg via INTRAVENOUS
  Filled 2012-04-24: qty 1

## 2012-04-24 MED ORDER — BIOTENE DRY MOUTH MT LIQD
15.0000 mL | Freq: Two times a day (BID) | OROMUCOSAL | Status: DC
  Administered 2012-04-24 – 2012-05-04 (×15): 15 mL via OROMUCOSAL

## 2012-04-24 MED ORDER — ONDANSETRON HCL 4 MG/2ML IJ SOLN
4.0000 mg | Freq: Once | INTRAMUSCULAR | Status: AC
  Administered 2012-04-24: 4 mg via INTRAVENOUS
  Filled 2012-04-24: qty 2

## 2012-04-24 MED ORDER — ASPIRIN 325 MG PO TABS: 81.0000 mg | ORAL_TABLET | Freq: Every day | ORAL | Status: DC

## 2012-04-24 MED ORDER — SODIUM CHLORIDE 0.9 % IV BOLUS (SEPSIS)
500.0000 mL | Freq: Once | INTRAVENOUS | Status: AC
  Administered 2012-04-24: 500 mL via INTRAVENOUS

## 2012-04-24 MED ORDER — ONDANSETRON HCL 4 MG/2ML IJ SOLN: 4.0000 mg | Freq: Once | INTRAMUSCULAR | Status: DC

## 2012-04-24 MED ORDER — METOCLOPRAMIDE HCL 5 MG/ML IJ SOLN
10.0000 mg | Freq: Once | INTRAMUSCULAR | Status: AC
  Administered 2012-04-24: 10 mg via INTRAVENOUS
  Filled 2012-04-24: qty 2

## 2012-04-24 NOTE — ED Notes (Signed)
The pt arrived by gems from home with abd pain that started at 2100 after eating a meal at 2000.  He feels like it is gas but is unable to pass the gas..  He has chronic gi probklems.  Last bm yesterday

## 2012-04-24 NOTE — ED Provider Notes (Signed)
History     CSN: [id number]  Arrival date & time 04/24/12  4098   First MD Initiated Contact with Patient 04/24/12 [phone]      Chief Complaint  Patient presents with  . Abdominal Pain    (Consider location/radiation/quality/duration/timing/severity/associated sxs/prior treatment) HPI 78 year old male presents with about 12 hours of diffuse abdominal pain with nausea without vomiting. He had normal bowel movement yesterday. His history of chronic constipation which has been stable recently for him. He is a history of chronic intermittent abdominal pain typically having a spell of less than 2 hours of diffuse abdominal crampy pain about once every several weeks or so it resolves either spontaneously or by taking over-the-counter gas X.  Since eating half a hamburger last night at 8:00 he is gradual onset diffuse abdominal pain all night now approaching 10-12 hours with a brief transient spell for a few minutes of nausea. He said no vomiting and no diarrhea. He did not have any bloody stool yesterday. He had normal bowel movement yesterday. He is no chest pain cough or shortness of breath. This pain feels somewhat crampy but is not colicky. He feels a bit better if he stays still on a little worse if he moves around. He has a nontender reducible left inguinal hernia which is not painful today. He is no dysuria. He tried Gas-X without relief. He recently had a normal upper endoscopy as well as colonoscopy. His pain is mild to moderately severe starting mildly and becoming moderately severe now. Past Medical History  Diagnosis Date  . Acute myocardial infarction of anterolateral wall, episode of care unspecified   . Coronary atherosclerosis of artery bypass graft   . Sinoatrial node dysfunction   . HTN (hypertension)   . HLD (hyperlipidemia)   . Depression   . History of kidney stones   . Congenital funnel chest   . Inguinal hernia   . Osteoporosis     Past Surgical History  Procedure Date    . Coronary artery bypass graft 1996  . Inguinal hernia repair 1996    right, hx  . Transurethral resection of prostate   . Ptca     percutaneous transluminal coronary intervention and brachy therapy, Bruce R. Juanda Chance, MD. EF 60%    Family History  Problem Relation Age of Onset  . Colon cancer Brother   . Diabetes Brother   . Heart disease Father   . Heart disease Brother   . Other Mother     brain tumor    History  Substance Use Topics  . Smoking status: Former Games developer  . Smokeless tobacco: Never Used     Comment: quit in the 1960's  . Alcohol Use: No      Review of Systems 10 Systems reviewed and are negative for acute change except as noted in the HPI. Allergies  Cold medicine plus  Home Medications   No current outpatient prescriptions on file.  BP 132/61  Pulse 75  Temp 98.6 F (37 C) (Oral)  Resp 12  Ht 5\' 11"  (1.803 m)  Wt 130 lb (58.968 kg)  BMI 18.13 kg/m2  SpO2 91%  Physical Exam  Nursing note and vitals reviewed. Constitutional:       Awake, alert, nontoxic appearance.  HENT:  Head: Atraumatic.  Eyes: Right eye exhibits no discharge. Left eye exhibits no discharge.  Neck: Neck supple.  Cardiovascular: Normal rate and regular rhythm.   No murmur heard. Pulmonary/Chest: Effort normal and breath sounds normal. No respiratory distress.  He has no wheezes. He has no rales. He exhibits no tenderness.  Abdominal: Soft. Bowel sounds are normal. He exhibits mass. He exhibits no distension. There is tenderness. There is no rebound and no guarding.       Minimal diffuse abdominal tenderness, he has a reducible nontender left inguinal hernia, no rebound tenderness.  Genitourinary:       Testicles nontender; he does not have a palpable right inguinal hernia.  Musculoskeletal: He exhibits no edema and no tenderness.       Baseline ROM, no obvious new focal weakness.  Neurological: He is alert.       Mental status and motor strength appears baseline for  patient and situation.  Skin: No rash noted.  Psychiatric: He has a normal mood and affect.    ED Course  Procedures (including critical care time) ECG: Normal sinus rhythm, ventricular rate 61, normal axis, normal intervals, no acute ischemic changes noted, no significant change noted compared with January 2013  Patient understand and agree with initial ED impression and plan with expectations set for ED visit. Plan labs and CT abdomen to rule out early obstruction, patient will be moved to the CDU. Medical screening examination/treatment/procedure(s) were conducted as a shared visit with non-physician practitioner(s) and myself.  I personally evaluated the patient during the encounter. 9562 Labs Reviewed  CBC WITH DIFFERENTIAL - Abnormal; Notable for the following:    WBC 12.3 (*)     RBC 4.18 (*)     Neutrophils Relative 89 (*)     Neutro Abs 11.0 (*)     Lymphocytes Relative 6 (*)     All other components within normal limits  URINALYSIS, ROUTINE W REFLEX MICROSCOPIC - Abnormal; Notable for the following:    Ketones, ur 15 (*)     All other components within normal limits  POCT I-STAT, CHEM 8 - Abnormal; Notable for the following:    BUN 25 (*)     Glucose, Bld 106 (*)     All other components within normal limits  CBC - Abnormal; Notable for the following:    WBC 11.8 (*)     RBC 4.09 (*)     Hemoglobin 12.8 (*)     All other components within normal limits  BASIC METABOLIC PANEL - Abnormal; Notable for the following:    GFR calc non Af Amer 78 (*)     All other components within normal limits  LIPASE, BLOOD  HEPATIC FUNCTION PANEL  POCT I-STAT TROPONIN I  CBC  SURGICAL PCR SCREEN   Dg Abd 2 Views  04/26/2012  *RADIOLOGY REPORT*  Clinical Data: Follow up small bowel obstruction.  ABDOMEN - 2 VIEW  Comparison: 04/25/2012 and 04/24/2012  Findings: Upright and supine views of the abdomen were obtained. Again noted are dilated gas-filled loops of small bowel with air- fluid  levels.  Nasogastric tube is coiled the stomach.  The degree of small bowel distention has not changed.  Again noted is stool in the right colon and pelvic region.  There is some lucency underneath the right hemidiaphragm but this may be associated with ascites based on the previous CT findings. Left basilar densities could represent atelectasis and cannot exclude a small pleural effusion.  IMPRESSION: Persistent dilatation of small bowel loops with air-fluid levels. There has been minimal change since the previous examination. Findings are compatible with a small bowel obstruction.  Lucency underneath the right hemidiaphragm most likely represents ascites.  Difficult to evaluate for free air  on this examination. Further evaluation for free air could be performed with a left lateral decubitus study.   Original Report Authenticated By: Richarda Overlie, M.D.      1. Small bowel obstruction       MDM  D/w Surg for admit after CT.        Hurman Horn, MD 04/27/12 [phone]

## 2012-04-24 NOTE — H&P (Signed)
Louis Reid is an 78 y.o. male.   Chief Complaint: Abd pain HPI: Louis Reid presents with abdominal pain and nausea that began last night after dinner. The pain gradually increased until he felt he could stand it no longer and came to the hospital. He denies emesis. He states he has had similar episodes sporadically for the last 18 months but they've always resolved quickly with rest +/- Gas-X. That did not work this time. He has not been seen for this problem.  Past Medical History  Diagnosis Date  . Acute myocardial infarction of anterolateral wall, episode of care unspecified   . Coronary atherosclerosis of artery bypass graft   . Sinoatrial node dysfunction   . HTN (hypertension)   . HLD (hyperlipidemia)   . Depression   . History of kidney stones   . Congenital funnel chest   . Inguinal hernia   . Osteoporosis     Past Surgical History  Procedure Date  . Coronary artery bypass graft 1996  . Inguinal hernia repair 1996    right, hx  . Transurethral resection of prostate   . Ptca     percutaneous transluminal coronary intervention and brachy therapy, Bruce R. Juanda Chance, MD. EF 60%    Family History  Problem Relation Age of Onset  . Colon cancer Brother   . Diabetes Brother   . Heart disease Father   . Heart disease Brother   . Other Mother     brain tumor   Social History:  reports that he has quit smoking. He has never used smokeless tobacco. He reports that he does not drink alcohol or use illicit drugs.  Allergies:  Allergies  Allergen Reactions  . Cold Medicine Plus (Chlorphen-Pseudoephed-Apap)     COLD MEDICATIONS MAKE HIM "LOOPY"     Results for orders placed during the hospital encounter of 04/24/12 (from the past 48 hour(s))  CBC WITH DIFFERENTIAL     Status: Abnormal   Collection Time   04/24/12  7:42 AM      Component Value Range Comment   WBC 12.3 (*) 4.0 - 10.5 K/uL    RBC 4.18 (*) 4.22 - 5.81 MIL/uL    Hemoglobin 13.3  13.0 - 17.0 g/dL    HCT 16.1   09.6 - 04.5 %    MCV 95.5  78.0 - 100.0 fL    MCH 31.8  26.0 - 34.0 pg    MCHC 33.3  30.0 - 36.0 g/dL    RDW 40.9  81.1 - 91.4 %    Platelets 170  150 - 400 K/uL    Neutrophils Relative 89 (*) 43 - 77 %    Neutro Abs 11.0 (*) 1.7 - 7.7 K/uL    Lymphocytes Relative 6 (*) 12 - 46 %    Lymphs Abs 0.7  0.7 - 4.0 K/uL    Monocytes Relative 5  3 - 12 %    Monocytes Absolute 0.6  0.1 - 1.0 K/uL    Eosinophils Relative 0  0 - 5 %    Eosinophils Absolute 0.0  0.0 - 0.7 K/uL    Basophils Relative 0  0 - 1 %    Basophils Absolute 0.0  0.0 - 0.1 K/uL   LIPASE, BLOOD     Status: Normal   Collection Time   04/24/12  7:42 AM      Component Value Range Comment   Lipase 29  11 - 59 U/L   HEPATIC FUNCTION PANEL  Status: Normal   Collection Time   04/24/12  7:42 AM      Component Value Range Comment   Total Protein 6.8  6.0 - 8.3 g/dL    Albumin 4.0  3.5 - 5.2 g/dL    AST 23  0 - 37 U/L    ALT 17  0 - 53 U/L    Alkaline Phosphatase 64  39 - 117 U/L    Total Bilirubin 0.6  0.3 - 1.2 mg/dL    Bilirubin, Direct 0.2  0.0 - 0.3 mg/dL    Indirect Bilirubin 0.4  0.3 - 0.9 mg/dL   POCT I-STAT TROPONIN I     Status: Normal   Collection Time   04/24/12  9:39 AM      Component Value Range Comment   Troponin i, poc 0.00  0.00 - 0.08 ng/mL    Comment 3            POCT I-STAT, CHEM 8     Status: Abnormal   Collection Time   04/24/12  9:41 AM      Component Value Range Comment   Sodium 141  135 - 145 mEq/L    Potassium 4.3  3.5 - 5.1 mEq/L    Chloride 104  96 - 112 mEq/L    BUN 25 (*) 6 - 23 mg/dL    Creatinine, Ser 2.95  0.50 - 1.35 mg/dL    Glucose, Bld 621 (*) 70 - 99 mg/dL    Calcium, Ion 3.08  6.57 - 1.30 mmol/L    TCO2 30  0 - 100 mmol/L    Hemoglobin 13.6  13.0 - 17.0 g/dL    HCT 84.6  96.2 - 95.2 %   URINALYSIS, ROUTINE W REFLEX MICROSCOPIC     Status: Abnormal   Collection Time   04/24/12  9:43 AM      Component Value Range Comment   Color, Urine YELLOW  YELLOW    APPearance CLEAR   CLEAR    Specific Gravity, Urine 1.026  1.005 - 1.030    pH 6.0  5.0 - 8.0    Glucose, UA NEGATIVE  NEGATIVE mg/dL    Hgb urine dipstick NEGATIVE  NEGATIVE    Bilirubin Urine NEGATIVE  NEGATIVE    Ketones, ur 15 (*) NEGATIVE mg/dL    Protein, ur NEGATIVE  NEGATIVE mg/dL    Urobilinogen, UA 1.0  0.0 - 1.0 mg/dL    Nitrite NEGATIVE  NEGATIVE    Leukocytes, UA NEGATIVE  NEGATIVE MICROSCOPIC NOT DONE ON URINES WITH NEGATIVE PROTEIN, BLOOD, LEUKOCYTES, NITRITE, OR GLUCOSE <1000 mg/dL.   Ct Abdomen Pelvis W Contrast  04/24/2012  *RADIOLOGY REPORT*  Clinical Data: Abdominal pain.  CT ABDOMEN AND PELVIS WITH CONTRAST  Technique:  Multidetector CT imaging of the abdomen and pelvis was performed following the standard protocol during bolus administration of intravenous contrast.  Contrast: OMNIPAQUE IOHEXOL 300 MG/ML  SOLN  Comparison: None.  Findings: Lung Bases: Severe pectus excavatum.  Median sternotomy, CABG.  Liver:  No focal mass lesions.  Probable mild intrahepatic biliary ductal dilation.  Periportal edema less likely.  Correlation with bilirubin recommended.  Spleen:  Normal.  Small accessory spleen dorsal to the spleen proper.  The  Gallbladder:  Normal appearance of the gallbladder.  Small ascites over the liver margin.  Common bile duct:  Within normal limits.  Pancreas:  Normal.  Adrenal glands:  Normal.  Kidneys:  Normal enhancement.  Bilateral renal atrophy.  Low density renal  cortical lesions compatible with small renal cysts. Extrarenal pelvis bilaterally.  Ureters grossly appear within normal limits.  Stomach:  Distended with oral contrast.  Small bowel:  Duodenum appears normal. Dilation of the jejunum extending in the anatomic pelvis.  There is decompressed distal small bowel compatible with high-grade if not complete small bowel obstruction.  No pneumatosis, perforation or free air.  Multiple angulated loops of small bowel suggest adhesive obstruction.  No mass lesion is identified.   Colon:   No colonic inflammatory changes.  Portions of the colon are decompressed.  No colonic obstruction.  Normal appendix in the right lower quadrant.  Pelvic Genitourinary:  Small amount of ascites.  Prostatic megaly. Ascites extends into a left inguinal hernia.  Bones:  No aggressive osseous lesions.  Degenerative disc disease most pronounced at L2-L3 and L5-S1.  Vasculature: Atherosclerosis.  No aneurysm.  IMPRESSION: 1.  Small bowel obstruction appears high-grade.  Decompressed terminal ileum is present.  Multiple angulated loops of small bowel suggest adhesive small bowel obstruction. 2.  Small volume of ascites. 3.  Renal cortical atrophy.  Bilateral prominent extrarenal pelvis. 4.  Severe pectus excavatum.  CABG. 5.  Mild intrahepatic biliary ductal dilation.  Correlation with bilirubin is recommended.  Common bile duct appears within normal limits and there are no calcified gallstones.   Original Report Authenticated By: Andreas Newport, M.D.     Review of Systems  Constitutional: Positive for chills and weight loss. Negative for fever and diaphoresis.  HENT: Negative.   Eyes: Negative.   Respiratory: Negative for shortness of breath.   Cardiovascular: Negative for chest pain.  Gastrointestinal: Positive for nausea, abdominal pain and constipation. Negative for vomiting.  Genitourinary: Negative for dysuria, urgency, frequency and hematuria.  Musculoskeletal: Positive for myalgias. Negative for joint pain.  Skin: Negative for rash.  Neurological: Negative for dizziness and tingling.  Endo/Heme/Allergies: Negative.   Psychiatric/Behavioral: Positive for depression.    Blood pressure 142/68, pulse 62, temperature 98.7 F (37.1 C), temperature source Oral, resp. rate 14, SpO2 100.00%. Physical Exam  Constitutional: No distress.  HENT:  Head: Normocephalic and atraumatic.  Right Ear: External ear normal.  Left Ear: External ear normal.  Eyes: Conjunctivae normal and EOM are normal.  Right eye exhibits no discharge. Left eye exhibits no discharge. No scleral icterus.  Neck: Normal range of motion. Neck supple.  Cardiovascular: Normal rate, regular rhythm, normal heart sounds and intact distal pulses.  Exam reveals no gallop and no friction rub.   No murmur heard. Respiratory: Effort normal and breath sounds normal. No respiratory distress. He has no wheezes. He has no rales. He exhibits deformity (Severe pescus excavatumj). He exhibits no tenderness.  GI: Soft. Bowel sounds are normal. He exhibits distension. There is tenderness. There is no rebound and no guarding. A hernia is present. Hernia confirmed positive in the left inguinal area.  Musculoskeletal: He exhibits no edema and no tenderness.  Lymphadenopathy:    He has no cervical adenopathy.  Neurological: He is alert.  Skin: Skin is warm and dry. He is not diaphoretic.  Psychiatric: He has a normal mood and affect. His behavior is normal. Thought content normal.     Assessment/Plan SBO -- Admit for management to include NGT, fluids, pain medication.  Riccardo Holeman J. 04/24/2012, 3:02 PM

## 2012-04-24 NOTE — ED Notes (Signed)
Pt back from CT

## 2012-04-24 NOTE — ED Notes (Signed)
Pt states his pain decreased briefly but then started back up.

## 2012-04-24 NOTE — ED Notes (Signed)
CT notified pt is done with contrast.

## 2012-04-24 NOTE — H&P (Signed)
Pt seen and agree.  p SBO.  History of chronic constipation. Admit for medical treatment.  NPO IVF  NGT.

## 2012-04-24 NOTE — ED Provider Notes (Signed)
1200  Report received from Dr. Fonnie Jarvis on this nice 78 yo male patient with abdominal pain since last pm at 8:30.  pmh of inguinal hernia surgery on the R with existing inguinal hernia on the L.  No nausea and vomiting.  Pain is 9/10 to entire lower abdomen today.  LBM yesterday am and normal.  States he has chronic constipation but controls it with stool softner and diet.  Denies SOB, fever chest pain.    1pm  CT abdomen show high grade blockage.  Dr. Fonnie Jarvis will call surgery.  Pain 9/10 meds ordered.    3pm Dr. Davina Poke to admit for Small Bowel Obstruction. Patient agrees with plan.  Bed request in.    Remi Haggard, NP 04/24/12 1528

## 2012-04-24 NOTE — ED Notes (Signed)
Patient transported to CT 

## 2012-04-25 ENCOUNTER — Inpatient Hospital Stay (HOSPITAL_COMMUNITY): Payer: Medicare Other

## 2012-04-25 LAB — CBC
HCT: 39 % (ref 39.0–52.0)
Hemoglobin: 12.8 g/dL — ABNORMAL LOW (ref 13.0–17.0)
RBC: 4.09 MIL/uL — ABNORMAL LOW (ref 4.22–5.81)
WBC: 11.8 10*3/uL — ABNORMAL HIGH (ref 4.0–10.5)

## 2012-04-25 MED ORDER — PHENOL 1.4 % MT LIQD
1.0000 | OROMUCOSAL | Status: DC | PRN
  Administered 2012-04-26: 1 via OROMUCOSAL
  Filled 2012-04-25: qty 177

## 2012-04-25 NOTE — Clinical Documentation Improvement (Signed)
BMI DOCUMENTATION CLARIFICATION QUERY  THIS DOCUMENT IS NOT A PERMANENT PART OF THE MEDICAL RECORD  TO RESPOND TO THE THIS QUERY, FOLLOW THE INSTRUCTIONS BELOW:  1. If needed, update documentation for the patient's encounter via the notes activity.  2. Access this query again and click edit on the In [employer].  3. After updating, or not, click F2 to complete all highlighted (required) fields concerning your review. Select "additional documentation in the medical record" OR "no additional documentation provided".  4. Click Sign note button.  5. The deficiency will fall out of your In Basket *Please let us know if you are not able to complete this workflow by phone or e-mail (listed below).         04/25/12  Dear:   Charma Igo PA  Marton Redwood  In an effort to better capture your patient's severity of illness, reflect appropriate length of stay and utilization of resources, a review of the patient medical record has revealed the following indicators.    Based on your clinical judgment, please clarify and document in a progress note and/or discharge summary the clinical condition associated with the following supporting information:  In responding to this query please exercise your independent judgment.  The fact that a query is asked, does not imply that any particular answer is desired or expected.  Possible Clinical conditions   Underweight w/BMI= 18.2   Other condition___________________  Cannot Clinically determine ______X_______   Risk Factors: Sign & Symptoms: Per 11/17 progress notes patient is positive for weight loss.   Weight: 130 lbs (per 11/17 docflowsheet) Height:    28ft  11in.   (per 11/17 docflowsheet) BMI=  18.2    (per 11/17 docflowsheet)    Reviewed:  no additional documentation provided  Thank You,  Shelda Pal RN, BSN, CCM   Clinical Documentation Specialist:  Pager [phone] , Phone  [phone] Clinical Documentation Specialist:  Health Information Management [place]

## 2012-04-25 NOTE — Progress Notes (Signed)
If this bowel obstruction has not resolved by Wednesday will take to the OR for exploration.  Marta Lamas. Gae Bon, MD, FACS [phone] [facility]

## 2012-04-25 NOTE — Progress Notes (Signed)
Patient ID: Louis Reid, male   DOB: May 15, 1933, 78 y.o.   MRN: [id number]    Subjective: Pt feels better today, but no flatus or BMs.  Requiring pain medication at times.  Objective: Vital signs in last 24 hours: Temp:  [98.5 F (36.9 C)-99.3 F (37.4 C)] 98.9 F (37.2 C) (11/18 0937) Pulse Rate:  [62-76] 71  (11/18 0937) Resp:  [14-19] 18  (11/18 0937) BP: (93-142)/(53-68) 127/55 mmHg (11/18 0937) SpO2:  [94 %-100 %] 97 % (11/18 0937) Weight:  [130 lb (58.968 kg)] 130 lb (58.968 kg) (11/17 1641)    Intake/Output from previous day: 11/17 0701 - 11/18 0700 In: 280 [I.V.:280] Out: 1600 [Urine:550; Emesis/NG output:450] Intake/Output this shift: Total I/O In: 0  Out: 250 [Urine:250]  PE: Abd: soft, distended, tinkling BS, NGT with bilious output.  No pain with current palpation, but just received some pain medication.  Lab Results:   Basename 04/25/12 0500 04/24/12 0941 04/24/12 0742  WBC 11.8* -- 12.3*  HGB 12.8* 13.6 --  HCT 39.0 40.0 --  PLT 178 -- 170   BMET  Basename 04/24/12 0941  NA 141  K 4.3  CL 104  CO2 --  GLUCOSE 106*  BUN 25*  CREATININE 1.10  CALCIUM --   PT/INR No results found for this basename: LABPROT:2,INR:2 in the last 72 hours CMP     Component Value Date/Time   NA 141 04/24/2012 0941   K 4.3 04/24/2012 0941   CL 104 04/24/2012 0941   CO2 30 01/27/2012 1011   GLUCOSE 106* 04/24/2012 0941   BUN 25* 04/24/2012 0941   CREATININE 1.10 04/24/2012 0941   CALCIUM 8.9 01/27/2012 1011   PROT 6.8 04/24/2012 0742   ALBUMIN 4.0 04/24/2012 0742   AST 23 04/24/2012 0742   ALT 17 04/24/2012 0742   ALKPHOS 64 04/24/2012 0742   BILITOT 0.6 04/24/2012 0742   GFRNONAA 65.19 04/22/2010 0841   Lipase     Component Value Date/Time   LIPASE 29 04/24/2012 0742       Studies/Results: Dg Abd 1 View  04/25/2012  *RADIOLOGY REPORT*  Clinical Data: Small bowel obstruction.  ABDOMEN - 1 VIEW  Comparison: CT of the abdomen and pelvis on 04/24/2012   Findings: Nasogastric tube extends into the stomach.  There remains dilatation of small bowel.  There is some air and stool present in the colon.  Findings remain likely consistent with a partial small bowel obstruction.  IMPRESSION: Findings consistent with partial small bowel obstruction.   Original Report Authenticated By: Irish Lack, M.D.    Ct Abdomen Pelvis W Contrast  04/24/2012  *RADIOLOGY REPORT*  Clinical Data: Abdominal pain.  CT ABDOMEN AND PELVIS WITH CONTRAST  Technique:  Multidetector CT imaging of the abdomen and pelvis was performed following the standard protocol during bolus administration of intravenous contrast.  Contrast: OMNIPAQUE IOHEXOL 300 MG/ML  SOLN  Comparison: None.  Findings: Lung Bases: Severe pectus excavatum.  Median sternotomy, CABG.  Liver:  No focal mass lesions.  Probable mild intrahepatic biliary ductal dilation.  Periportal edema less likely.  Correlation with bilirubin recommended.  Spleen:  Normal.  Small accessory spleen dorsal to the spleen proper.  The  Gallbladder:  Normal appearance of the gallbladder.  Small ascites over the liver margin.  Common bile duct:  Within normal limits.  Pancreas:  Normal.  Adrenal glands:  Normal.  Kidneys:  Normal enhancement.  Bilateral renal atrophy.  Low density renal cortical lesions compatible with small renal  cysts. Extrarenal pelvis bilaterally.  Ureters grossly appear within normal limits.  Stomach:  Distended with oral contrast.  Small bowel:  Duodenum appears normal. Dilation of the jejunum extending in the anatomic pelvis.  There is decompressed distal small bowel compatible with high-grade if not complete small bowel obstruction.  No pneumatosis, perforation or free air.  Multiple angulated loops of small bowel suggest adhesive obstruction.  No mass lesion is identified.  Colon:   No colonic inflammatory changes.  Portions of the colon are decompressed.  No colonic obstruction.  Normal appendix in the right lower  quadrant.  Pelvic Genitourinary:  Small amount of ascites.  Prostatic megaly. Ascites extends into a left inguinal hernia.  Bones:  No aggressive osseous lesions.  Degenerative disc disease most pronounced at L2-L3 and L5-S1.  Vasculature: Atherosclerosis.  No aneurysm.  IMPRESSION: 1.  Small bowel obstruction appears high-grade.  Decompressed terminal ileum is present.  Multiple angulated loops of small bowel suggest adhesive small bowel obstruction. 2.  Small volume of ascites. 3.  Renal cortical atrophy.  Bilateral prominent extrarenal pelvis. 4.  Severe pectus excavatum.  CABG. 5.  Mild intrahepatic biliary ductal dilation.  Correlation with bilirubin is recommended.  Common bile duct appears within normal limits and there are no calcified gallstones.   Original Report Authenticated By: Andreas Newport, M.D.     Anti-infectives: Anti-infectives    None       Assessment/Plan 1. SBO 2. Chronic constipation Patient Active Problem List  Diagnosis  . HYPERLIPIDEMIA  . HYPERTENSION  . Asymptomatic old MI (myocardial infarction)  . TOBACCO USE, QUIT  . DYSPNEA  . Coronary artery disease  . History of coronary artery bypass surgery  Pectus excavatum  Plan: 1. Would like to avoid surgical intervention if possible given cardiac history, but currently little change in overall obstruction.  Cont NGT. 2. Repeat films in the morning. 3. Patient has had no prior intra-abdominal surgery (just right inguinal hernia repair).  Cause of obstruction is not known, but CT questions adhesive disease.  LOS: 1 day    OSBORNE,KELLY E 04/25/2012, 12:48 PM Pager: [phone]  I did his right inguinal hernia (open) a number of years ago.  Patient has no other abdominal surgery.  He had a colonoscopy by Dr. Sharia Reeve just 2 months ago which was negative. Still somewhat distended, but not tender.  Needs to ambulate, in that he has not been out of bed.  Ovidio Kin, MD, [facility] Pager:  [phone] Office phone:  [phone]

## 2012-04-26 ENCOUNTER — Inpatient Hospital Stay (HOSPITAL_COMMUNITY): Payer: Medicare Other

## 2012-04-26 LAB — CBC
HCT: 39.9 % (ref 39.0–52.0)
Hemoglobin: 13.2 g/dL (ref 13.0–17.0)
MCH: 31.3 pg (ref 26.0–34.0)
MCV: 94.5 fL (ref 78.0–100.0)
Platelets: 165 10*3/uL (ref 150–400)
RBC: 4.22 MIL/uL (ref 4.22–5.81)
WBC: 10.1 10*3/uL (ref 4.0–10.5)

## 2012-04-26 LAB — BASIC METABOLIC PANEL
BUN: 23 mg/dL (ref 6–23)
CO2: 26 mEq/L (ref 19–32)
Calcium: 8.4 mg/dL (ref 8.4–10.5)
Chloride: 102 mEq/L (ref 96–112)
Creatinine, Ser: 0.95 mg/dL (ref 0.50–1.35)
Glucose, Bld: 92 mg/dL (ref 70–99)

## 2012-04-26 MED ORDER — DIPHENHYDRAMINE HCL 50 MG/ML IJ SOLN
25.0000 mg | Freq: Every evening | INTRAMUSCULAR | Status: DC | PRN
  Administered 2012-04-26 – 2012-05-01 (×2): 25 mg via INTRAVENOUS
  Filled 2012-04-26 (×2): qty 1

## 2012-04-26 MED ORDER — DIPHENHYDRAMINE HCL 25 MG PO CAPS: 25.0000 mg | ORAL_CAPSULE | Freq: Every evening | ORAL | Status: DC | PRN

## 2012-04-26 NOTE — Progress Notes (Signed)
Subjective: Still distended, had a small BM and some flatus, but still feels distended.  Objective: Vital signs in last 24 hours: Temp:  [98.9 F (37.2 C)-99.5 F (37.5 C)] 99.1 F (37.3 C) (11/19 0645) Pulse Rate:  [75-86] 81  (11/19 0645) Resp:  [16-18] 16  (11/19 0645) BP: (111-133)/(55-69) 114/55 mmHg (11/19 0645) SpO2:  [95 %-96 %] 95 % (11/19 0645)  Film shows ongoing SB loops with with air fluid, ascites, nothing recorded from NG, it is coiled in the stomach. TM 99.5, labs OK Intake/Output from previous day: 11/18 0701 - 11/19 0700 In: 2016.7 [I.V.:2016.7] Out: 860 [Urine:860] Intake/Output this shift: Total I/O In: 30 [NG/GT:30] Out: -   General appearance: alert, cooperative and no distress Resp: clear to auscultation bilaterally GI: soft, distended, BS, hypoactive. some flatus, small BM just now, you don't need a film to feel distended loops of small bowel.  Lab Results:   Basename 04/26/12 0625 04/25/12 0500  WBC 10.1 11.8*  HGB 13.2 12.8*  HCT 39.9 39.0  PLT 165 178    BMET  Basename 04/26/12 0625 04/24/12 0941  NA 137 141  K 4.3 4.3  CL 102 104  CO2 26 --  GLUCOSE 92 106*  BUN 23 25*  CREATININE 0.95 1.10  CALCIUM 8.4 --   PT/INR No results found for this basename: LABPROT:2,INR:2 in the last 72 hours   Lab 04/24/12 0742  AST 23  ALT 17  ALKPHOS 64  BILITOT 0.6  PROT 6.8  ALBUMIN 4.0     Lipase     Component Value Date/Time   LIPASE 29 04/24/2012 0742     Studies/Results: Dg Abd 1 View  04/25/2012  *RADIOLOGY REPORT*  Clinical Data: Small bowel obstruction.  ABDOMEN - 1 VIEW  Comparison: CT of the abdomen and pelvis on 04/24/2012  Findings: Nasogastric tube extends into the stomach.  There remains dilatation of small bowel.  There is some air and stool present in the colon.  Findings remain likely consistent with a partial small bowel obstruction.  IMPRESSION: Findings consistent with partial small bowel obstruction.   Original  Report Authenticated By: Irish Lack, M.D.    Ct Abdomen Pelvis W Contrast  04/24/2012  *RADIOLOGY REPORT*  Clinical Data: Abdominal pain.  CT ABDOMEN AND PELVIS WITH CONTRAST  Technique:  Multidetector CT imaging of the abdomen and pelvis was performed following the standard protocol during bolus administration of intravenous contrast.  Contrast: OMNIPAQUE IOHEXOL 300 MG/ML  SOLN  Comparison: None.  Findings: Lung Bases: Severe pectus excavatum.  Median sternotomy, CABG.  Liver:  No focal mass lesions.  Probable mild intrahepatic biliary ductal dilation.  Periportal edema less likely.  Correlation with bilirubin recommended.  Spleen:  Normal.  Small accessory spleen dorsal to the spleen proper.  The  Gallbladder:  Normal appearance of the gallbladder.  Small ascites over the liver margin.  Common bile duct:  Within normal limits.  Pancreas:  Normal.  Adrenal glands:  Normal.  Kidneys:  Normal enhancement.  Bilateral renal atrophy.  Low density renal cortical lesions compatible with small renal cysts. Extrarenal pelvis bilaterally.  Ureters grossly appear within normal limits.  Stomach:  Distended with oral contrast.  Small bowel:  Duodenum appears normal. Dilation of the jejunum extending in the anatomic pelvis.  There is decompressed distal small bowel compatible with high-grade if not complete small bowel obstruction.  No pneumatosis, perforation or free air.  Multiple angulated loops of small bowel suggest adhesive obstruction.  No mass  lesion is identified.  Colon:   No colonic inflammatory changes.  Portions of the colon are decompressed.  No colonic obstruction.  Normal appendix in the right lower quadrant.  Pelvic Genitourinary:  Small amount of ascites.  Prostatic megaly. Ascites extends into a left inguinal hernia.  Bones:  No aggressive osseous lesions.  Degenerative disc disease most pronounced at L2-L3 and L5-S1.  Vasculature: Atherosclerosis.  No aneurysm.  IMPRESSION: 1.  Small bowel  obstruction appears high-grade.  Decompressed terminal ileum is present.  Multiple angulated loops of small bowel suggest adhesive small bowel obstruction. 2.  Small volume of ascites. 3.  Renal cortical atrophy.  Bilateral prominent extrarenal pelvis. 4.  Severe pectus excavatum.  CABG. 5.  Mild intrahepatic biliary ductal dilation.  Correlation with bilirubin is recommended.  Common bile duct appears within normal limits and there are no calcified gallstones.   Original Report Authenticated By: Andreas Newport, M.D.    Dg Abd 2 Views  04/26/2012  *RADIOLOGY REPORT*  Clinical Data: Follow up small bowel obstruction.  ABDOMEN - 2 VIEW  Comparison: 04/25/2012 and 04/24/2012  Findings: Upright and supine views of the abdomen were obtained. Again noted are dilated gas-filled loops of small bowel with air- fluid levels.  Nasogastric tube is coiled the stomach.  The degree of small bowel distention has not changed.  Again noted is stool in the right colon and pelvic region.  There is some lucency underneath the right hemidiaphragm but this may be associated with ascites based on the previous CT findings. Left basilar densities could represent atelectasis and cannot exclude a small pleural effusion.  IMPRESSION: Persistent dilatation of small bowel loops with air-fluid levels. There has been minimal change since the previous examination. Findings are compatible with a small bowel obstruction.  Lucency underneath the right hemidiaphragm most likely represents ascites.  Difficult to evaluate for free air on this examination. Further evaluation for free air could be performed with a left lateral decubitus study.   Original Report Authenticated By: Richarda Overlie, M.D.     Medications:    . antiseptic oral rinse  15 mL Mouth Rinse q12n4p  . aspirin EC  162 mg Oral Daily  . benazepril  10 mg Oral Daily  . chlorhexidine  15 mL Mouth Rinse BID  . enoxaparin  40 mg Subcutaneous Q24H  . sertraline  50 mg Oral Daily     Assessment/Plan SBO, hx of TURP, RIH, cabg Acute myocardial infarction of anterolateral wall, CABG, Sinoatrial node dysfunction, PTCA HYPERTENSION  Dyslipidemia Depression Hx of kidney stones Funnel chest Osteoporosis  Plan:  Even with some flatus and small BM he is still clinically obstructed.  I will have nursing pull back his NG some, continue suction and work on walking in halls. If he is not better tomorrow Dr. Lindie Spruce may take to surgery.  LOS: 2 days    Nathaly Dawkins 04/26/2012

## 2012-04-27 ENCOUNTER — Encounter (HOSPITAL_COMMUNITY): Payer: Self-pay | Admitting: Anesthesiology

## 2012-04-27 ENCOUNTER — Encounter (HOSPITAL_COMMUNITY): Admission: EM | Disposition: A | Discharge status: Home / Self Care | Payer: Self-pay | Source: Home / Self Care

## 2012-04-27 ENCOUNTER — Inpatient Hospital Stay (HOSPITAL_COMMUNITY): Payer: Medicare Other | Admitting: Anesthesiology

## 2012-04-27 DIAGNOSIS — L820 Inflamed seborrheic keratosis: Secondary | ICD-10-CM

## 2012-04-27 HISTORY — PX: LAPAROTOMY: SHX154

## 2012-04-27 LAB — SURGICAL PCR SCREEN
MRSA, PCR: NEGATIVE
Staphylococcus aureus: NEGATIVE

## 2012-04-27 SURGERY — LAPAROTOMY, EXPLORATORY
Anesthesia: General | Site: Abdomen | Wound class: Dirty or Infected

## 2012-04-27 MED ORDER — ONDANSETRON HCL 4 MG/2ML IJ SOLN
4.0000 mg | Freq: Four times a day (QID) | INTRAMUSCULAR | Status: DC | PRN
  Filled 2012-04-27: qty 2

## 2012-04-27 MED ORDER — OXYCODONE HCL 5 MG PO TABS: 5.0000 mg | ORAL_TABLET | Freq: Once | ORAL | Status: DC | PRN

## 2012-04-27 MED ORDER — SUCCINYLCHOLINE CHLORIDE 20 MG/ML IJ SOLN
INTRAMUSCULAR | Status: DC | PRN
  Administered 2012-04-27: 100 mg via INTRAVENOUS

## 2012-04-27 MED ORDER — LACTATED RINGERS IV SOLN
INTRAVENOUS | Status: DC | PRN
  Administered 2012-04-27 (×2): via INTRAVENOUS

## 2012-04-27 MED ORDER — NALOXONE HCL 0.4 MG/ML IJ SOLN
0.4000 mg | INTRAMUSCULAR | Status: DC | PRN
  Filled 2012-04-27: qty 1

## 2012-04-27 MED ORDER — LIDOCAINE HCL (CARDIAC) 20 MG/ML IV SOLN
INTRAVENOUS | Status: DC | PRN
  Administered 2012-04-27: 50 mg via INTRAVENOUS

## 2012-04-27 MED ORDER — OXYCODONE HCL 5 MG/5ML PO SOLN: 5.0000 mg | Freq: Once | ORAL | Status: DC | PRN

## 2012-04-27 MED ORDER — PROPOFOL 10 MG/ML IV BOLUS
INTRAVENOUS | Status: DC | PRN
  Administered 2012-04-27: 140 mg via INTRAVENOUS

## 2012-04-27 MED ORDER — ONDANSETRON HCL 4 MG/2ML IJ SOLN: 4.0000 mg | Freq: Four times a day (QID) | INTRAMUSCULAR | Status: DC | PRN

## 2012-04-27 MED ORDER — 0.9 % SODIUM CHLORIDE (POUR BTL) OPTIME
TOPICAL | Status: DC | PRN
  Administered 2012-04-27 (×2): 1000 mL

## 2012-04-27 MED ORDER — GLYCOPYRROLATE 0.2 MG/ML IJ SOLN
INTRAMUSCULAR | Status: DC | PRN
  Administered 2012-04-27: 0.4 mg via INTRAVENOUS

## 2012-04-27 MED ORDER — VECURONIUM BROMIDE 10 MG IV SOLR
INTRAVENOUS | Status: DC | PRN
  Administered 2012-04-27: 4 mg via INTRAVENOUS

## 2012-04-27 MED ORDER — HYDROMORPHONE HCL PF 1 MG/ML IJ SOLN
0.2500 mg | INTRAMUSCULAR | Status: DC | PRN
  Administered 2012-04-27 (×2): 0.5 mg via INTRAVENOUS

## 2012-04-27 MED ORDER — NEOSTIGMINE METHYLSULFATE 1 MG/ML IJ SOLN
INTRAMUSCULAR | Status: DC | PRN
  Administered 2012-04-27: 5 mg via INTRAVENOUS

## 2012-04-27 MED ORDER — MORPHINE SULFATE (PF) 1 MG/ML IV SOLN
INTRAVENOUS | Status: AC
  Filled 2012-04-27: qty 25

## 2012-04-27 MED ORDER — ONDANSETRON HCL 4 MG/2ML IJ SOLN
INTRAMUSCULAR | Status: DC | PRN
  Administered 2012-04-27: 4 mg via INTRAVENOUS

## 2012-04-27 MED ORDER — POVIDONE-IODINE 10 % EX OINT
TOPICAL_OINTMENT | CUTANEOUS | Status: AC
Start: 2012-04-27 — End: 2012-04-27
  Filled 2012-04-27: qty 28.35

## 2012-04-27 MED ORDER — DEXTROSE 5 % IV SOLN
2.0000 g | INTRAVENOUS | Status: AC
  Administered 2012-04-27: 2 g via INTRAVENOUS
  Filled 2012-04-27: qty 2

## 2012-04-27 MED ORDER — POVIDONE-IODINE 10 % EX OINT
TOPICAL_OINTMENT | CUTANEOUS | Status: DC | PRN
  Administered 2012-04-27: 1 via TOPICAL

## 2012-04-27 MED ORDER — MORPHINE SULFATE (PF) 1 MG/ML IV SOLN
INTRAVENOUS | Status: DC
  Administered 2012-04-27: 4.5 mg via INTRAVENOUS
  Administered 2012-04-27: 20:00:00 via INTRAVENOUS
  Administered 2012-04-28: 3.5 mg via INTRAVENOUS
  Administered 2012-04-28: 12:00:00 via INTRAVENOUS
  Administered 2012-04-28: 3 mg via INTRAVENOUS
  Administered 2012-04-28: 4.5 mg via INTRAVENOUS
  Administered 2012-04-28: 1.5 mg via INTRAVENOUS
  Administered 2012-04-28: 3 mg via INTRAVENOUS
  Administered 2012-04-29: 1 mg via INTRAVENOUS
  Filled 2012-04-27: qty 25

## 2012-04-27 MED ORDER — LACTATED RINGERS IV SOLN: INTRAVENOUS | Status: DC

## 2012-04-27 MED ORDER — DIPHENHYDRAMINE HCL 50 MG/ML IJ SOLN
12.5000 mg | Freq: Four times a day (QID) | INTRAMUSCULAR | Status: DC | PRN
  Filled 2012-04-27: qty 0.25

## 2012-04-27 MED ORDER — SODIUM CHLORIDE 0.9 % IJ SOLN: 9.0000 mL | INTRAMUSCULAR | Status: DC | PRN

## 2012-04-27 MED ORDER — DEXTROSE 5 % IV SOLN
1.0000 g | Freq: Once | INTRAVENOUS | Status: AC
  Administered 2012-04-27: 1 g via INTRAVENOUS
  Filled 2012-04-27 (×2): qty 1

## 2012-04-27 MED ORDER — FENTANYL CITRATE 0.05 MG/ML IJ SOLN
INTRAMUSCULAR | Status: DC | PRN
  Administered 2012-04-27: 100 ug via INTRAVENOUS
  Administered 2012-04-27: 150 ug via INTRAVENOUS

## 2012-04-27 SURGICAL SUPPLY — 37 items
BLADE SURG ROTATE 9660 (MISCELLANEOUS) IMPLANT
CANISTER SUCTION 2500CC (MISCELLANEOUS) ×2 IMPLANT
CHLORAPREP W/TINT 26ML (MISCELLANEOUS) ×2 IMPLANT
CLOTH BEACON ORANGE TIMEOUT ST (SAFETY) ×2 IMPLANT
COVER SURGICAL LIGHT HANDLE (MISCELLANEOUS) ×2 IMPLANT
DRAPE LAPAROSCOPIC ABDOMINAL (DRAPES) ×2 IMPLANT
DRAPE UTILITY 15X26 W/TAPE STR (DRAPE) ×4 IMPLANT
DRAPE WARM FLUID 44X44 (DRAPE) ×2 IMPLANT
ELECT BLADE 6.5 EXT (BLADE) IMPLANT
ELECT REM PT RETURN 9FT ADLT (ELECTROSURGICAL) ×2
ELECTRODE REM PT RTRN 9FT ADLT (ELECTROSURGICAL) ×1 IMPLANT
GLOVE BIOGEL PI IND STRL 8 (GLOVE) ×1 IMPLANT
GLOVE BIOGEL PI INDICATOR 8 (GLOVE) ×1
GLOVE ECLIPSE 7.5 STRL STRAW (GLOVE) ×2 IMPLANT
GOWN STRL NON-REIN LRG LVL3 (GOWN DISPOSABLE) ×6 IMPLANT
KIT BASIN OR (CUSTOM PROCEDURE TRAY) ×2 IMPLANT
KIT ROOM TURNOVER OR (KITS) ×2 IMPLANT
LIGASURE IMPACT 36 18CM CVD LR (INSTRUMENTS) IMPLANT
NS IRRIG 1000ML POUR BTL (IV SOLUTION) ×4 IMPLANT
PACK GENERAL/GYN (CUSTOM PROCEDURE TRAY) ×2 IMPLANT
PAD ARMBOARD 7.5X6 YLW CONV (MISCELLANEOUS) ×4 IMPLANT
SPECIMEN JAR X LARGE (MISCELLANEOUS) IMPLANT
SPONGE GAUZE 4X4 12PLY (GAUZE/BANDAGES/DRESSINGS) ×2 IMPLANT
SPONGE LAP 18X18 X RAY DECT (DISPOSABLE) IMPLANT
STAPLER VISISTAT 35W (STAPLE) ×2 IMPLANT
SUCTION POOLE TIP (SUCTIONS) IMPLANT
SUT PDS AB 1 TP1 96 (SUTURE) ×4 IMPLANT
SUT SILK 2 0 SH CR/8 (SUTURE) ×2 IMPLANT
SUT SILK 2 0 TIES 10X30 (SUTURE) ×2 IMPLANT
SUT SILK 3 0 SH CR/8 (SUTURE) ×2 IMPLANT
SUT SILK 3 0 TIES 10X30 (SUTURE) ×2 IMPLANT
TAPE CLOTH SURG 6X10 WHT LF (GAUZE/BANDAGES/DRESSINGS) ×2 IMPLANT
TOWEL OR 17X24 6PK STRL BLUE (TOWEL DISPOSABLE) ×2 IMPLANT
TOWEL OR 17X26 10 PK STRL BLUE (TOWEL DISPOSABLE) ×2 IMPLANT
TRAY FOLEY CATH 14FRSI W/METER (CATHETERS) IMPLANT
WATER STERILE IRR 1000ML POUR (IV SOLUTION) IMPLANT
YANKAUER SUCT BULB TIP NO VENT (SUCTIONS) IMPLANT

## 2012-04-27 NOTE — Transfer of Care (Signed)
Immediate Anesthesia Transfer of Care Note  Patient: Louis Reid  Procedure(s) Performed: Procedure(s) (LRB) with comments: EXPLORATORY LAPAROTOMY (N/A)  Patient Location: PACU  Anesthesia Type:General  Level of Consciousness: awake, alert  and oriented  Airway & Oxygen Therapy: Patient Spontanous Breathing and Patient connected to nasal cannula oxygen  Post-op Assessment: Report given to PACU RN and Post -op Vital signs reviewed and stable  Post vital signs: Reviewed and stable  Complications: No apparent anesthesia complications

## 2012-04-27 NOTE — Op Note (Signed)
OPERATIVE REPORT  DATE OF OPERATION: 04/24/2012 - 04/27/2012  PATIENT:  Louis Reid  78 y.o. male  PRE-OPERATIVE DIAGNOSIS:  Bowel obstruction  POST-OPERATIVE DIAGNOSIS:  Bowel obstruction/ABNORMAL ABDOMINAL WALL SKIN LESION/MULTIPLE BENIGN LOOKING JEJUNAL DIVERTICULA  PROCEDURE:  Procedure(s): EXPLORATORY LAPAROTOMY/Enterolysis/Excisional biopsy of skin lesions  SURGEON:  Surgeon(s): Cherylynn Ridges, MD Shelly Rubenstein, MD  ASSISTANT: Carman Ching  ANESTHESIA:   general  EBL: <50 ml  BLOOD ADMINISTERED: none  DRAINS: Nasogastric Tube and Urinary Catheter (Foley)   SPECIMEN:  Source of Specimen:  Epigastric abdominal wall skin  COUNTS CORRECT:  YES  PROCEDURE DETAILS: The patient was taken to the operating room and placed on the table in the supine position. After an adequate general endotracheal anesthetic was administered she was prepped and draped in usual sterile manner exposing his entire abdomen.  After proper time out was performed identifying the patient and the procedure be performed it was noted that just below the xiphoid process in the epigastrium of the abdominal wall there were 2 abnormal pigmented skin lesions which we excise into the dermis and subcutaneous tissue. This was sent as a separate specimen. We went ahead and did a midline incision from the xiphoid to down below the umbilicus. We entered the peritoneal cavity through the midline fascia using electrocautery.  There were multiple dilated small bowel loops most which appeared to be proximal to the terminal ileum. As we mobilized small bowel polyp of the lower quadrants of the abdomen appeared as though there was some type tethering in the left lower quadrant. There was where there transition zone from dilated to decompressed small bowel. Rate is small bowel from the ligament of Treitz down to the terminal ileum. There was a transition point in the distal jejunum. This is probably where there was an  adhesive band related to some redundant peritoneal scar tissue of the descending colon.  We ran the small bowel again and again there were no other lesions found. The patient did have a direct hernia but this is not related to his obstruction and was not repaired at this time.also it was noted that the patient have multiple jejunal diverticula most of which one the mesenteric border with the largest one to be just distal to the ligament of Treitz on the antimesenteric border. These appeared to be completely benign none appeared to be perforated and resection was not warranted.  We milked the succus entericus back into the stomach and was aspirated by the NG tube that was in place. The NGT was in adequate position. The small bowel appeared to be somewhat ischemic upon initial examination however it did revive itself and become more pink as time went on with the decompression of the small bowel.  There was no evidence of any type of metastatic disease. We closed the abdomen subsequently using running looped #1 PDS. Approximately 200 cc of ascites was aspirated. However there was minimal bleeding. All counts were correct. We irrigated subcutaneous with saline then closed the skin using stainless steel staples. All needle counts, sponge counts, and instrument counts were correct.  PATIENT DISPOSITION:  PACU - hemodynamically stable.   Cherylynn Ridges 11/20/20137:43 PM

## 2012-04-27 NOTE — Preoperative (Signed)
Beta Blockers   Reason not to administer Beta Blockers:Not Applicable 

## 2012-04-27 NOTE — Anesthesia Procedure Notes (Signed)
Procedure Name: Intubation Date/Time: 04/27/2012 6:32 PM Performed by: Gwenyth Allegra Pre-anesthesia Checklist: Emergency Drugs available, Patient identified, Timeout performed, Suction available and Patient being monitored Patient Re-evaluated:Patient Re-evaluated prior to inductionOxygen Delivery Method: Circle system utilized Preoxygenation: Pre-oxygenation with 100% oxygen Intubation Type: IV induction, Rapid sequence and Cricoid Pressure applied Laryngoscope Size: Mac and 3 Grade View: Grade III Tube type: Oral Tube size: 7.5 mm Number of attempts: 2 Airway Equipment and Method: Stylet Placement Confirmation: ETT inserted through vocal cords under direct vision,  breath sounds checked- equal and bilateral and positive ETCO2 Dental Injury: Teeth and Oropharynx as per pre-operative assessment  Difficulty Due To: Difficulty was anticipated and Difficult Airway- due to anterior larynx

## 2012-04-27 NOTE — Progress Notes (Signed)
Although slightly better, bowel obstruction has not resolved.  Will go ahead with exploratory laparotomy today.  There is a possibility that could fix LIH at the same time, but would prefer not to if mesh would be needed and it is unrelated to the bowel obstruction.  Marta Lamas. Gae Bon, MD, FACS [phone] [facility]

## 2012-04-27 NOTE — Progress Notes (Signed)
We will decide tomorrow if exploratory laparotomy is necessary.  Marta Lamas. Gae Bon, MD, FACS [phone] [facility]

## 2012-04-27 NOTE — Anesthesia Postprocedure Evaluation (Signed)
  Anesthesia Post-op Note  Patient: Louis Reid  Procedure(s) Performed: Procedure(s) (LRB) with comments: EXPLORATORY LAPAROTOMY (N/A)  Patient Location: PACU  Anesthesia Type:General  Level of Consciousness: awake and sedated  Airway and Oxygen Therapy: Patient Spontanous Breathing  Post-op Pain: mild  Post-op Assessment: Post-op Vital signs reviewed  Post-op Vital Signs: stable  Complications: No apparent anesthesia complications

## 2012-04-27 NOTE — Anesthesia Preprocedure Evaluation (Signed)
Anesthesia Evaluation  Patient identified by MRN, date of birth, ID band Patient awake    Reviewed: Allergy & Precautions, H&P , NPO status , Patient's Chart, lab work & pertinent test results  Airway Mallampati: II  Neck ROM: full    Dental   Pulmonary shortness of breath,          Cardiovascular hypertension, + CAD, + Past MI and + CABG + dysrhythmias     Neuro/Psych Depression    GI/Hepatic   Endo/Other    Renal/GU      Musculoskeletal   Abdominal   Peds  Hematology   Anesthesia Other Findings   Reproductive/Obstetrics                           Anesthesia Physical Anesthesia Plan  ASA: III  Anesthesia Plan: General   Post-op Pain Management:    Induction: Intravenous  Airway Management Planned: Oral ETT  Additional Equipment:   Intra-op Plan:   Post-operative Plan: Extubation in OR  Informed Consent: I have reviewed the patients History and Physical, chart, labs and discussed the procedure including the risks, benefits and alternatives for the proposed anesthesia with the patient or authorized representative who has indicated his/her understanding and acceptance.     Plan Discussed with: CRNA and Surgeon  Anesthesia Plan Comments:         Anesthesia Quick Evaluation

## 2012-04-28 ENCOUNTER — Encounter (HOSPITAL_COMMUNITY): Payer: Self-pay | Admitting: General Surgery

## 2012-04-28 MED ORDER — POTASSIUM CHLORIDE IN NACL 20-0.45 MEQ/L-% IV SOLN
INTRAVENOUS | Status: DC
  Administered 2012-04-28 (×2): via INTRAVENOUS
  Administered 2012-04-29 – 2012-05-01 (×2): 20 mL/h via INTRAVENOUS
  Administered 2012-05-02: 22:00:00 via INTRAVENOUS
  Administered 2012-05-03: 1000 mL via INTRAVENOUS
  Administered 2012-05-04 – 2012-05-07 (×2): 20 mL/h via INTRAVENOUS
  Filled 2012-04-28 (×14): qty 1000

## 2012-04-28 MED ORDER — DEXTROSE 5 % IV SOLN
1.0000 g | Freq: Four times a day (QID) | INTRAVENOUS | Status: AC
  Administered 2012-04-28 (×2): 1 g via INTRAVENOUS
  Filled 2012-04-28 (×2): qty 1

## 2012-04-28 NOTE — Progress Notes (Signed)
1 Day Post-Op  Subjective: [name]. Feels better this am. No flatus or BM. Pain well controlled.   Objective: Vital signs in last 24 hours: Temp:  [97.4 F (36.3 C)-99.7 F (37.6 C)] 99.7 F (37.6 C) (11/21 0500) Pulse Rate:  [71-90] 90  (11/21 0500) Resp:  [11-18] 12  (11/21 0848) BP: (110-147)/(56-77) 110/63 mmHg (11/21 0500) SpO2:  [91 %-100 %] 96 % (11/21 0848) Last BM Date: 04/26/12  Intake/Output from previous day: 11/20 0701 - 11/21 0700 In: 2145 [I.V.:2085; NG/GT:60] Out: 2476 [Urine:1600; Emesis/NG output:75; Stool:1; Blood:50] Intake/Output this shift:    PE: Gen: NAD, No distress Res: CTAB CV: RRR Abd: minimal bowel sounds, mild diffuse tenderness to palpation, midline incision w/ staples w/o induration or purulent DC. No active weeping.   Lab Results:   [facility] 04/26/12 0625  WBC 10.1  HGB 13.2  HCT 39.9  PLT 165   BMET  Basename 04/26/12 0625  NA 137  K 4.3  CL 102  CO2 26  GLUCOSE 92  BUN 23  CREATININE 0.95  CALCIUM 8.4   PT/INR No results found for this basename: LABPROT:2,INR:2 in the last 72 hours CMP     Component Value Date/Time   NA 137 04/26/2012 0625   K 4.3 04/26/2012 0625   CL 102 04/26/2012 0625   CO2 26 04/26/2012 0625   GLUCOSE 92 04/26/2012 0625   BUN 23 04/26/2012 0625   CREATININE 0.95 04/26/2012 0625   CALCIUM 8.4 04/26/2012 0625   PROT 6.8 04/24/2012 0742   ALBUMIN 4.0 04/24/2012 0742   AST 23 04/24/2012 0742   ALT 17 04/24/2012 0742   ALKPHOS 64 04/24/2012 0742   BILITOT 0.6 04/24/2012 0742   GFRNONAA 78* 04/26/2012 0625   GFRAA >90 04/26/2012 0625   Lipase     Component Value Date/Time   LIPASE 29 04/24/2012 0742       Studies/Results: No results found.  Anti-infectives: Anti-infectives     Start     Dose/Rate Route Frequency Ordered Stop   04/27/12 1900   cefOXitin (MEFOXIN) 1 g in dextrose 5 % 50 mL IVPB        1 g 100 mL/hr over 30 Minutes Intravenous  Once 04/27/12 1850 04/27/12 2113   04/27/12 0645   cefOXitin (MEFOXIN) 2 g in dextrose 5 % 50 mL IVPB        2 g 100 mL/hr over 30 Minutes Intravenous On call to O.R. 04/27/12 0635 04/27/12 0813           Assessment/Plan  78yo M w/ SBO s/p Ex Lap w/ lysis of adhesions POD#1 - Continue NGT w/ strict I&O - Continue IV pain meds - May have Ice Chips - OOB to chair  - dressing change today - am labs ordered     LOS: 4 days    MERRELL, Louis Reid Resident PGY-2 04/28/2012, 9:17 AM

## 2012-04-29 LAB — CBC
Hemoglobin: 11.9 g/dL — ABNORMAL LOW (ref 13.0–17.0)
MCV: 95.6 fL (ref 78.0–100.0)
Platelets: 173 10*3/uL (ref 150–400)
RBC: 3.86 MIL/uL — ABNORMAL LOW (ref 4.22–5.81)
WBC: 6.4 10*3/uL (ref 4.0–10.5)

## 2012-04-29 LAB — PHOSPHORUS: Phosphorus: 2.2 mg/dL — ABNORMAL LOW (ref 2.3–4.6)

## 2012-04-29 LAB — BASIC METABOLIC PANEL
CO2: 24 mEq/L (ref 19–32)
Chloride: 102 mEq/L (ref 96–112)
Creatinine, Ser: 0.88 mg/dL (ref 0.50–1.35)
Sodium: 137 mEq/L (ref 135–145)

## 2012-04-29 LAB — MAGNESIUM: Magnesium: 2.3 mg/dL (ref 1.5–2.5)

## 2012-04-29 MED ORDER — MORPHINE SULFATE 2 MG/ML IJ SOLN: 2.0000 mg | INTRAMUSCULAR | Status: DC | PRN

## 2012-04-29 NOTE — Progress Notes (Signed)
2 Days Post-Op  Subjective: NAD. Feels much better today. No flatus but feels stomach moving. Minimal pain. Denies palpiataitons, Syncope, HA, nausea. Peeing frequently.   Objective: Vital signs in last 24 hours: Temp:  [98.4 F (36.9 C)-99.7 F (37.6 C)] 98.4 F (36.9 C) (11/22 0507) Pulse Rate:  [84-94] 84  (11/22 0507) Resp:  [15-23] 17  (11/22 0806) BP: (109-144)/(53-72) 144/71 mmHg (11/22 0507) SpO2:  [94 %-98 %] 96 % (11/22 0806) Last BM Date: 04/26/12  Intake/Output from previous day: 11/21 0701 - 11/22 0700 In: 2112.5 [I.V.:2012.5; NG/GT:100] Out: 1525 [Urine:1375; Emesis/NG output:150] Intake/Output this shift:    PE: Gen: NAD Abd: midline incision w/ staples in place. No erythema or induration. BS present. Mild tenderness to palpation.  CV: RRR Lab Results:   Basename 04/29/12 0612  WBC 6.4  HGB 11.9*  HCT 36.9*  PLT 173   BMET  Basename 04/29/12 0612  NA 137  K 4.3  CL 102  CO2 24  GLUCOSE 92  BUN 19  CREATININE 0.88  CALCIUM 7.7*   PT/INR No results found for this basename: LABPROT:2,INR:2 in the last 72 hours CMP     Component Value Date/Time   NA 137 04/29/2012 0612   K 4.3 04/29/2012 0612   CL 102 04/29/2012 0612   CO2 24 04/29/2012 0612   GLUCOSE 92 04/29/2012 0612   BUN 19 04/29/2012 0612   CREATININE 0.88 04/29/2012 0612   CALCIUM 7.7* 04/29/2012 0612   PROT 6.8 04/24/2012 0742   ALBUMIN 4.0 04/24/2012 0742   AST 23 04/24/2012 0742   ALT 17 04/24/2012 0742   ALKPHOS 64 04/24/2012 0742   BILITOT 0.6 04/24/2012 0742   GFRNONAA 81* 04/29/2012 0612   GFRAA >90 04/29/2012 0612   Lipase     Component Value Date/Time   LIPASE 29 04/24/2012 0742       Studies/Results: No results found.  Anti-infectives: Anti-infectives     Start     Dose/Rate Route Frequency Ordered Stop   04/28/12 1200   cefOXitin (MEFOXIN) 1 g in dextrose 5 % 50 mL IVPB        1 g 100 mL/hr over 30 Minutes Intravenous Every 6 hours 04/28/12 1032  04/28/12 1739   04/27/12 1900   cefOXitin (MEFOXIN) 1 g in dextrose 5 % 50 mL IVPB        1 g 100 mL/hr over 30 Minutes Intravenous  Once 04/27/12 1850 04/27/12 2113   04/27/12 0645   cefOXitin (MEFOXIN) 2 g in dextrose 5 % 50 mL IVPB        2 g 100 mL/hr over 30 Minutes Intravenous On call to O.R. 04/27/12 1324 04/27/12 0813           Assessment/Plan  79yo M w/ SBO s/p Ex Lap w/ lysis of adhesions POD#2 - NGT clamp trial - DC PCA, Continue IV morphine until taking PO - May have Ice Chips. If NGT comes out will start clears - hypocalcemic today. Asymptomatic. Checking mag, phos, Alb.. Ca Gluconate if still low - KVO IVF - OOB to chair  - DC dressing tomorrow  - am labs ordered    LOS: 5 days    MERRELL, Louis Reid Resident PGY-2 04/29/2012, 8:51 AM

## 2012-04-30 LAB — COMPREHENSIVE METABOLIC PANEL
ALT: 14 U/L (ref 0–53)
AST: 19 U/L (ref 0–37)
CO2: 26 mEq/L (ref 19–32)
Chloride: 103 mEq/L (ref 96–112)
GFR calc non Af Amer: 84 mL/min — ABNORMAL LOW (ref >90)
Sodium: 140 mEq/L (ref 135–145)
Total Bilirubin: 0.5 mg/dL (ref 0.3–1.2)

## 2012-04-30 LAB — CBC
Platelets: 194 10*3/uL (ref 150–400)
RBC: 3.95 MIL/uL — ABNORMAL LOW (ref 4.22–5.81)
WBC: 6.4 10*3/uL (ref 4.0–10.5)

## 2012-04-30 MED ORDER — BACITRACIN-NEOMYCIN-POLYMYXIN 400-5-5000 EX OINT
TOPICAL_OINTMENT | CUTANEOUS | Status: AC
  Filled 2012-04-30: qty 1

## 2012-04-30 NOTE — Progress Notes (Signed)
Agree with above. Mobilize Con't NGT for now Await bowel function

## 2012-04-30 NOTE — Progress Notes (Signed)
In good spirits.  No bowel activity, but with his obstruction and the procedure, this is not surprise  Louis Reid. Gae Bon, MD, FACS [phone] [facility]

## 2012-04-30 NOTE — Progress Notes (Signed)
3 Days Post-Op  Subjective: He's up in chair voiding, a little flatus, some gurgling of his stomach.  Objective: Vital signs in last 24 hours: Temp:  [97.3 F (36.3 C)-99.7 F (37.6 C)] 98.4 F (36.9 C) (11/23 0517) Pulse Rate:  [74-80] 79  (11/23 0517) Resp:  [16-18] 17  (11/23 0517) BP: (124-141)/(65-74) 141/74 mmHg (11/23 0517) SpO2:  [93 %-95 %] 94 % (11/23 0517) Last BM Date: 04/26/12  525 from NG, Diet: ice chips, 99.7, labs ok  Intake/Output from previous day: 11/22 0701 - 11/23 0700 In: 750.6 [I.V.:750.6] Out: 1700 [Urine:1175; Emesis/NG output:525] Intake/Output this shift:    General appearance: alert, cooperative and no distress Resp: clear to auscultation bilaterally GI: still pretty distended, few bowel sound, some flatus.    Lab Results:   [facility] 04/30/12 0555 04/29/12 0612  WBC 6.4 6.4  HGB 12.3* 11.9*  HCT 37.6* 36.9*  PLT 194 173    BMET  Basename 04/30/12 0555 04/29/12 0612  NA 140 137  K 4.3 4.3  CL 103 102  CO2 26 24  GLUCOSE 87 92  BUN 22 19  CREATININE 0.80 0.88  CALCIUM 8.2* 7.7*   PT/INR No results found for this basename: LABPROT:2,INR:2 in the last 72 hours   Lab 04/30/12 0555 04/29/12 0612 04/24/12 0742  AST 19 -- 23  ALT 14 -- 17  ALKPHOS 55 -- 64  BILITOT 0.5 -- 0.6  PROT 5.6* -- 6.8  ALBUMIN 2.4* 2.4* 4.0     Lipase     Component Value Date/Time   LIPASE 29 04/24/2012 0742     Studies/Results: No results found.  Medications:    . antiseptic oral rinse  15 mL Mouth Rinse q12n4p  . chlorhexidine  15 mL Mouth Rinse BID  . enoxaparin  40 mg Subcutaneous Q24H    Assessment/Plan SBO, hx of TURP, RIH, cabg  Acute myocardial infarction of anterolateral wall, CABG, Sinoatrial node dysfunction, PTCA  HYPERTENSION  Dyslipidemia  Depression  Hx of kidney stones  Funnel chest  Osteoporosis  Plan:  He's still pretty distended, and I don't think it's time to pull NG, I will do clamping trials and work on  walking him today. Incision looks fine.  LOS: 6 days    Louis Reid 04/30/2012

## 2012-05-01 MED ORDER — PANTOPRAZOLE SODIUM 40 MG IV SOLR
40.0000 mg | Freq: Two times a day (BID) | INTRAVENOUS | Status: DC
  Administered 2012-05-01 – 2012-05-04 (×8): 40 mg via INTRAVENOUS
  Filled 2012-05-01 (×10): qty 40

## 2012-05-01 MED ORDER — CHLORHEXIDINE GLUCONATE 0.12 % MT SOLN
OROMUCOSAL | Status: AC
  Administered 2012-05-01: 15 mL via OROMUCOSAL
  Filled 2012-05-01: qty 15

## 2012-05-01 NOTE — Progress Notes (Signed)
4 Days Post-Op  Subjective: Still draining allot from his NG, tube in throat feels different, and he's not really happy with that.  Worried about being off his antidepressant.  Objective: Vital signs in last 24 hours: Temp:  [98.1 F (36.7 C)-98.8 F (37.1 C)] 98.8 F (37.1 C) (11/23 2059) Pulse Rate:  [74-79] 79  (11/23 2059) Resp:  [18] 18  (11/23 2059) BP: (119-143)/(66-87) 119/87 mmHg (11/23 2059) SpO2:  [96 %-97 %] 96 % (11/23 2059) Last BM Date: 04/26/12  1200 ml from NG yesterday recorded. Afebrile, VSS  Intake/Output from previous day: 11/23 0701 - 11/24 0700 In: 240 [I.V.:240] Out: 1875 [Urine:675; Emesis/NG output:1200] Intake/Output this shift:    General appearance: alert, cooperative and no distress Resp: clear to auscultation bilaterally GI: His abdomen is still a bit distended, not as bad as before, he's up in chair too.  + BS, not much flatus  Lab Results:   [facility] 04/30/12 0555 04/29/12 0612  WBC 6.4 6.4  HGB 12.3* 11.9*  HCT 37.6* 36.9*  PLT 194 173    BMET  Basename 04/30/12 0555 04/29/12 0612  NA 140 137  K 4.3 4.3  CL 103 102  CO2 26 24  GLUCOSE 87 92  BUN 22 19  CREATININE 0.80 0.88  CALCIUM 8.2* 7.7*   PT/INR No results found for this basename: LABPROT:2,INR:2 in the last 72 hours   Lab 04/30/12 0555 04/29/12 0612  AST 19 --  ALT 14 --  ALKPHOS 55 --  BILITOT 0.5 --  PROT 5.6* --  ALBUMIN 2.4* 2.4*     Lipase     Component Value Date/Time   LIPASE 29 04/24/2012 0742     Studies/Results: No results found.  Medications:    . antiseptic oral rinse  15 mL Mouth Rinse q12n4p  . chlorhexidine  15 mL Mouth Rinse BID  . enoxaparin  40 mg Subcutaneous Q24H  . [EXPIRED] neomycin-bacitracin-polymyxin        Assessment/Plan SBO, hx of TURP, RIH, cabg  S/p EXPLORATORY LAPAROTOMY/Enterolysis/Excisional biopsy of skin lesions, 04/27/12 Dr. Frederik Schmidt. Acute myocardial infarction of anterolateral wall, CABG, Sinoatrial node  dysfunction, PTCA  HYPERTENSION  Dyslipidemia  Depression  Hx of kidney stones  Funnel chest  Osteoporosis  Plan:  Still has not opened up.  Now in hospital for 7 days. Symptoms started day before admit. Will discuss PICC line and TNA with Dr. Carolynne Edouard.  Check pre albumin in AM.  He looks malnourished at admission. Add PPI    LOS: 7 days    Laurena Valko 05/01/2012

## 2012-05-02 LAB — COMPREHENSIVE METABOLIC PANEL
ALT: 15 U/L (ref 0–53)
AST: 20 U/L (ref 0–37)
Albumin: 2.7 g/dL — ABNORMAL LOW (ref 3.5–5.2)
Alkaline Phosphatase: 58 U/L (ref 39–117)
CO2: 27 mEq/L (ref 19–32)
Chloride: 103 mEq/L (ref 96–112)
Creatinine, Ser: 0.85 mg/dL (ref 0.50–1.35)
GFR calc non Af Amer: 82 mL/min — ABNORMAL LOW (ref >90)
Potassium: 4.1 mEq/L (ref 3.5–5.1)
Sodium: 144 mEq/L (ref 135–145)
Total Bilirubin: 0.6 mg/dL (ref 0.3–1.2)

## 2012-05-02 MED ORDER — WHITE PETROLATUM GEL: Status: DC | PRN

## 2012-05-02 MED ORDER — WHITE PETROLATUM GEL
Status: AC
  Administered 2012-05-02: 0.2
  Filled 2012-05-02: qty 5

## 2012-05-02 NOTE — Progress Notes (Signed)
INITIAL ADULT NUTRITION ASSESSMENT Date: 05/02/2012   Time: 2:15 PM Reason for Assessment: NPO/CL x 8 days  ASSESSMENT: Male 78 y.o.  Dx: SBO s/p ex lap with SBR and LOA  Hx:  Past Medical History  Diagnosis Date  . Acute myocardial infarction of anterolateral wall, episode of care unspecified   . Coronary atherosclerosis of artery bypass graft   . Sinoatrial node dysfunction   . HTN (hypertension)   . HLD (hyperlipidemia)   . Depression   . History of kidney stones   . Congenital funnel chest   . Inguinal hernia   . Osteoporosis    Past Surgical History  Procedure Date  . Coronary artery bypass graft 1996  . Inguinal hernia repair 1996    right, hx  . Transurethral resection of prostate   . Ptca     percutaneous transluminal coronary intervention and brachy therapy, Bruce R. Juanda Chance, MD. EF 60%  . Laparotomy 04/27/2012    Procedure: EXPLORATORY LAPAROTOMY;  Surgeon: Cherylynn Ridges, MD;  Location: [facility] OR;  Service: General;  Laterality: N/A;    Related Meds:     . antiseptic oral rinse  15 mL Mouth Rinse q12n4p  . chlorhexidine  15 mL Mouth Rinse BID  . enoxaparin  40 mg Subcutaneous Q24H  . pantoprazole (PROTONIX) IV  40 mg Intravenous Q12H  . [COMPLETED] white petrolatum       Ht: 5\' 11"  (180.3 cm)  Wt: 130 lb (58.968 kg)  Ideal Wt: 78 kg % Ideal Wt: 76  Usual Wt:  Wt Readings from Last 10 Encounters:  04/24/12 130 lb (58.968 kg)  04/24/12 130 lb (58.968 kg)  02/11/12 133 lb (60.328 kg)  01/27/12 133 lb (60.328 kg)  07/09/11 135 lb (61.236 kg)  06/16/10 133 lb (60.328 kg)  06/04/10 135 lb 4 oz (61.349 kg)  05/01/09 138 lb (62.596 kg)    % Usual Wt: 98% of weight 2 months ago.  Last weight 11/17.  Body mass index is 18.13 kg/(m^2).-underweight  Labs:  CMP     Component Value Date/Time   NA 144 05/02/2012 0500   K 4.1 05/02/2012 0500   CL 103 05/02/2012 0500   CO2 27 05/02/2012 0500   GLUCOSE 96 05/02/2012 0500   BUN 26* 05/02/2012 0500   CREATININE 0.85 05/02/2012 0500   CALCIUM 8.6 05/02/2012 0500   PROT 5.8* 05/02/2012 0500   ALBUMIN 2.7* 05/02/2012 0500   AST 20 05/02/2012 0500   ALT 15 05/02/2012 0500   ALKPHOS 58 05/02/2012 0500   BILITOT 0.6 05/02/2012 0500   GFRNONAA 82* 05/02/2012 0500   GFRAA >90 05/02/2012 0500    I/O last 3 completed shifts: In: 728.7 [I.V.:728.7] Out: 2900 [Urine:1000; Emesis/NG output:1900] Total I/O In: 240 [P.O.:240] Out: -    Diet Order: NPO except for ice chips  Supplements/Tube Feeding:  none  IVF:    0.45 % NaCl with KCl 20 mEq / L Last Rate: 50 mL/hr at 05/02/12 0956    Estimated Nutritional Needs:   Kcal: 1800-1900 Protein: 80-90 gm Fluid: 1.8-1.9L  Food/Nutrition Related Hx: Pt reports following a low fat diet prior to admit.  Likes most foods but usually eats like a bird.  Weight down to 125# about 6 months ago secondary to depression.  Weight has increased with resolution of this.  Was drinking one High Protein Boost shake daily prior to admit.    Pt has had a BM and looking forward to diet advancement.  NG tube fell  out today.  Pt meets criteria for severe malnutrition related to acute and chronic illness AEB no intake for 8 days, decreased body fat and muscle mass.  Obvious temporal wasting, very thin extremities.  NUTRITION DIAGNOSIS: -Inadequate oral intake (NI-2.1).  Status: Ongoing  RELATED TO: altered GI function  AS EVIDENCE BY: npo status  MONITORING/EVALUATION(Goals): Diet advancement, intake, labs, weight Goal:  Diet advancement and tolerance when medically able.  EDUCATION NEEDS: Discussed with pt need to increase intake after discharge to work on weight gain.  Recommended supplement with boost or ensure daily.  INTERVENTION: Diet advancement when appropriate per MD Resource Breeze tid when diet advance or Ensure when solids.  Dietitian [phone]  DOCUMENTATION CODES Per approved criteria  -Severe malnutrition in the context of chronic  illness -Underweight    Rajan Burgard, Anastasia Fiedler 05/02/2012, 2:15 PM

## 2012-05-02 NOTE — Progress Notes (Signed)
Ng out. +BM. No n/v. Ambulated multiple times today  Alert, nad Soft, nd  Npo except ice tonight  Mary Sella. Andrey Campanile, MD, FACS General, Bariatric, & Minimally Invasive Surgery [facility] Surgery, [place]

## 2012-05-02 NOTE — Progress Notes (Signed)
Patient ID: Louis Reid, male   DOB: [birth year], 78 y.o.   MRN: [id number] 5 Days Post-Op  Subjective: Pt feels ok.  Passing some flatus, no BM.  Ambulating well.  Objective: Vital signs in last 24 hours: Temp:  [98.9 F (37.2 C)-99.1 F (37.3 C)] 98.9 F (37.2 C) (11/25 0626) Pulse Rate:  [79-87] 79  (11/25 0626) Resp:  [16-18] 16  (11/25 0626) BP: (128-133)/(65-68) 133/68 mmHg (11/25 0626) SpO2:  [95 %-98 %] 96 % (11/25 0626) Last BM Date: 04/24/12  Intake/Output from previous day: 11/24 0701 - 11/25 0700 In: 488.7 [I.V.:488.7] Out: 1650 [Urine:750; Emesis/NG output:900] Intake/Output this shift:    PE: Abd: soft, +BS, appropriately tender, NGT with some output.  Incision c/d/i with staples  Lab Results:   [facility] 04/30/12 0555  WBC 6.4  HGB 12.3*  HCT 37.6*  PLT 194   BMET  Basename 05/02/12 0500 04/30/12 0555  NA 144 140  K 4.1 4.3  CL 103 103  CO2 27 26  GLUCOSE 96 87  BUN 26* 22  CREATININE 0.85 0.80  CALCIUM 8.6 8.2*   PT/INR No results found for this basename: LABPROT:2,INR:2 in the last 72 hours CMP     Component Value Date/Time   NA 144 05/02/2012 0500   K 4.1 05/02/2012 0500   CL 103 05/02/2012 0500   CO2 27 05/02/2012 0500   GLUCOSE 96 05/02/2012 0500   BUN 26* 05/02/2012 0500   CREATININE 0.85 05/02/2012 0500   CALCIUM 8.6 05/02/2012 0500   PROT 5.8* 05/02/2012 0500   ALBUMIN 2.7* 05/02/2012 0500   AST 20 05/02/2012 0500   ALT 15 05/02/2012 0500   ALKPHOS 58 05/02/2012 0500   BILITOT 0.6 05/02/2012 0500   GFRNONAA 82* 05/02/2012 0500   GFRAA >90 05/02/2012 0500   Lipase     Component Value Date/Time   LIPASE 29 04/24/2012 0742       Studies/Results: No results found.  Anti-infectives: Anti-infectives     Start     Dose/Rate Route Frequency Ordered Stop   04/28/12 1200   cefOXitin (MEFOXIN) 1 g in dextrose 5 % 50 mL IVPB        1 g 100 mL/hr over 30 Minutes Intravenous Every 6 hours 04/28/12 1032 04/28/12 1739   04/27/12 1900   cefOXitin (MEFOXIN) 1 g in dextrose 5 % 50 mL IVPB        1 g 100 mL/hr over 30 Minutes Intravenous  Once 04/27/12 1850 04/27/12 2113   04/27/12 0645   cefOXitin (MEFOXIN) 2 g in dextrose 5 % 50 mL IVPB        2 g 100 mL/hr over 30 Minutes Intravenous On call to O.R. 04/27/12 1191 04/27/12 0813           Assessment/Plan  1. SBO 2. S/p ex lap with SBR and LOA  Plan: 1. Patient has good BS and passed some gas last night.  Will try and clamp NGT today.  If he tolerates this then will dc NGT and keep NPO.   LOS: 8 days    Enes Rokosz E 05/02/2012, 9:39 AM Pager: [phone]

## 2012-05-03 ENCOUNTER — Encounter (HOSPITAL_COMMUNITY): Payer: Self-pay

## 2012-05-03 MED ORDER — SERTRALINE HCL 50 MG PO TABS
50.0000 mg | ORAL_TABLET | Freq: Every day | ORAL | Status: DC
  Administered 2012-05-03 – 2012-05-09 (×6): 50 mg via ORAL
  Filled 2012-05-03 (×7): qty 1

## 2012-05-03 MED ORDER — BOOST / RESOURCE BREEZE PO LIQD
1.0000 | Freq: Three times a day (TID) | ORAL | Status: DC
  Administered 2012-05-03 – 2012-05-04 (×6): 1 via ORAL

## 2012-05-03 NOTE — Progress Notes (Signed)
Doing well. Has had 2 bms. Taking some liquids. No n/v  abd soft, nt, nd  Stay on clears today If does well with clears, fulls in am  Mary Sella. Andrey Campanile, MD, FACS General, Bariatric, & Minimally Invasive Surgery [facility] Surgery, [place]

## 2012-05-03 NOTE — Progress Notes (Signed)
Patient ID: Louis Reid, male   DOB: 14-Jun-1932, 78 y.o.   MRN: [id number] 6 Days Post-Op  Subjective: Pt feels well.  Had a great BM overnight.  No nausea with NGT out.  Objective: Vital signs in last 24 hours: Temp:  [97.9 F (36.6 C)-98.8 F (37.1 C)] 98.8 F (37.1 C) (11/26 0700) Pulse Rate:  [65-72] 68  (11/26 0700) Resp:  [14-18] 16  (11/26 0700) BP: (122-133)/(63-70) 126/63 mmHg (11/26 0700) SpO2:  [95 %-99 %] 97 % (11/26 0700) Last BM Date: 05/03/12  Intake/Output from previous day: 11/25 0701 - 11/26 0700 In: 1169.1 [P.O.:240; I.V.:879.1; NG/GT:40; IV Piggyback:10] Out: 172 [Urine:172] Intake/Output this shift:    PE: Abd: soft, appropriately tender, +BS, incision c/d/i with staples  Lab Results:  No results found for this basename: WBC:2,HGB:2,HCT:2,PLT:2 in the last 72 hours BMET  Basename 05/02/12 0500  NA 144  K 4.1  CL 103  CO2 27  GLUCOSE 96  BUN 26*  CREATININE 0.85  CALCIUM 8.6   PT/INR No results found for this basename: LABPROT:2,INR:2 in the last 72 hours CMP     Component Value Date/Time   NA 144 05/02/2012 0500   K 4.1 05/02/2012 0500   CL 103 05/02/2012 0500   CO2 27 05/02/2012 0500   GLUCOSE 96 05/02/2012 0500   BUN 26* 05/02/2012 0500   CREATININE 0.85 05/02/2012 0500   CALCIUM 8.6 05/02/2012 0500   PROT 5.8* 05/02/2012 0500   ALBUMIN 2.7* 05/02/2012 0500   AST 20 05/02/2012 0500   ALT 15 05/02/2012 0500   ALKPHOS 58 05/02/2012 0500   BILITOT 0.6 05/02/2012 0500   GFRNONAA 82* 05/02/2012 0500   GFRAA >90 05/02/2012 0500   Lipase     Component Value Date/Time   LIPASE 29 04/24/2012 0742       Studies/Results: No results found.  Anti-infectives: Anti-infectives     Start     Dose/Rate Route Frequency Ordered Stop   04/28/12 1200   cefOXitin (MEFOXIN) 1 g in dextrose 5 % 50 mL IVPB        1 g 100 mL/hr over 30 Minutes Intravenous Every 6 hours 04/28/12 1032 04/28/12 1739   04/27/12 1900   cefOXitin (MEFOXIN) 1 g  in dextrose 5 % 50 mL IVPB        1 g 100 mL/hr over 30 Minutes Intravenous  Once 04/27/12 1850 04/27/12 2113   04/27/12 0645   cefOXitin (MEFOXIN) 2 g in dextrose 5 % 50 mL IVPB        2 g 100 mL/hr over 30 Minutes Intravenous On call to O.R. 04/27/12 1191 04/27/12 0813           Assessment/Plan  1. S/p ex lap with SBR for SBO 2. Post op ileus, resolving  Plan: 1. Will start clear liquids today.  Hopefully home by Thursday morning.   LOS: 9 days    Romar Woodrick E 05/03/2012, 8:49 AM Pager: [phone]

## 2012-05-04 MED ORDER — HYDROCODONE-ACETAMINOPHEN 5-325 MG PO TABS: 1.0000 | ORAL_TABLET | ORAL | Status: DC | PRN

## 2012-05-04 MED ORDER — PANTOPRAZOLE SODIUM 40 MG PO TBEC
40.0000 mg | DELAYED_RELEASE_TABLET | Freq: Two times a day (BID) | ORAL | Status: DC
  Administered 2012-05-04 – 2012-05-09 (×8): 40 mg via ORAL
  Filled 2012-05-04 (×7): qty 1

## 2012-05-04 NOTE — Progress Notes (Addendum)
7 Days Post-Op  Subjective: No complaints Tolerating po  Objective: Vital signs in last 24 hours: Temp:  [98.3 F (36.8 C)-98.7 F (37.1 C)] 98.7 F (37.1 C) (11/27 0532) Pulse Rate:  [67-72] 68  (11/27 0532) Resp:  [16-20] 16  (11/27 0532) BP: (94-117)/(59-72) 113/64 mmHg (11/27 0532) SpO2:  [96 %-100 %] 98 % (11/27 0532) Last BM Date: 05/03/12  Intake/Output from previous day: 11/26 0701 - 11/27 0700 In: 1925.8 [P.O.:720; I.V.:1205.8] Out: 900 [Urine:900] Intake/Output this shift:    Abdomen soft, incision clean, non distended  Lab Results:  No results found for this basename: WBC:2,HGB:2,HCT:2,PLT:2 in the last 72 hours BMET  Basename 05/02/12 0500  NA 144  K 4.1  CL 103  CO2 27  GLUCOSE 96  BUN 26*  CREATININE 0.85  CALCIUM 8.6   PT/INR No results found for this basename: LABPROT:2,INR:2 in the last 72 hours ABG No results found for this basename: PHART:2,PCO2:2,PO2:2,HCO3:2 in the last 72 hours  Studies/Results: No results found.  Anti-infectives: Anti-infectives     Start     Dose/Rate Route Frequency Ordered Stop   04/28/12 1200   cefOXitin (MEFOXIN) 1 g in dextrose 5 % 50 mL IVPB        1 g 100 mL/hr over 30 Minutes Intravenous Every 6 hours 04/28/12 1032 04/28/12 1739   04/27/12 1900   cefOXitin (MEFOXIN) 1 g in dextrose 5 % 50 mL IVPB        1 g 100 mL/hr over 30 Minutes Intravenous  Once 04/27/12 1850 04/27/12 2113   04/27/12 0645   cefOXitin (MEFOXIN) 2 g in dextrose 5 % 50 mL IVPB        2 g 100 mL/hr over 30 Minutes Intravenous On call to O.R. 04/27/12 1610 04/27/12 0813          Assessment/Plan: s/p Procedure(s) (LRB) with comments: EXPLORATORY LAPAROTOMY (N/A)  Advance po Hopefully home tomorrow  Consider DC staples prior to dc home tomorrow Louis Reid E   LOS: 10 days    BLACKMAN,DOUGLAS A 05/04/2012

## 2012-05-04 NOTE — Progress Notes (Signed)
PHARMACIST - PHYSICIAN COMMUNICATION  CONCERNING: Protonix IV to Oral Route Change Policy  RECOMMENDATION: This patient is receiving Protonix by the intravenous route.  Based on criteria approved by the Pharmacy and Therapeutics Committee, this is being converted to the equivalent oral dose form(s).   DESCRIPTION: These criteria include:  The patient is not neutropenic and does not exhibit a GI malabsorption state. Did not have GIB this admit.  The patient is eating (either orally or via tube) and/or has been taking other orally administered medications for a least 24 hours  If you have questions about this conversion, please contact the Pharmacy Department  Kate Larock K. Allena Katz, PharmD, BCPS.  Clinical Pharmacist Pager [phone]. 05/04/2012 11:56 AM

## 2012-05-04 NOTE — Discharge Summary (Addendum)
Patient ID: ENGLISH CRAIGHEAD MRN: [id number] DOB/AGE: [birth year] 78 y.o.  Admit date: 04/24/2012 Discharge date: 05/05/2012  Procedures: EXPLORATORY LAPAROTOMY/Enterolysis/Excisional biopsy of skin lesions  Consults: None  Reason for Admission: Jimmie presents with abdominal pain and nausea that began last night after dinner. The pain gradually increased until he felt he could stand it no longer and came to the hospital. He denies emesis. He states he has had similar episodes sporadically for the last 18 months but they've always resolved quickly with rest +/- Gas-X. That did not work this time. He has not been seen for this problem.  Admission Diagnoses:  1. SBO Patient Active Problem List  Diagnosis  . HYPERLIPIDEMIA  . HYPERTENSION  . Asymptomatic old MI (myocardial infarction)  . TOBACCO USE, QUIT  . DYSPNEA  . Coronary artery disease  . History of coronary artery bypass surgery    Hospital Course: The patient was admitted and an NG tube was placed for bowel decompression and conservative management. After several days the patient's x-rays did not improve. He continued to have no bowel function and we felt it necessary to take into the operating room for surgical intervention. Just several hours prior to surgery the patient had a large bowel movement. Surgery was held for that day. We've reevaluated the patient the following day and determined that his bowel obstruction persisted. He was then taken to the operating room on 04/27/2012 by Dr. Lindie Spruce. The patient underwent an exploratory laparotomy with lysis of adhesions and small bowel resection. The patient tolerated the procedure well. Postoperatively he had ileus. His NG tube was continued. On postoperative day 5, the patient began passing flatus but no bowel movements. We tried NG tube clamping trials. The patient tolerated these well and the following day he was having bowel movements. His NG tube was discontinued. He was started on a  clear liquid diet and this was advanced as tolerated.  He began complaining on POD# 8, of pain with swallowing.  He was kept on his PPI and carafate was added.  This did not seem to help and GI was asked to evaluate.  They did an EGD which revealed esophagitis and gastritis.  He was otherwise felt stable for dc home on POD# 12.  Discharge Diagnoses:  1. small bowel obstruction 2. status post exploratory laparotomy with lysis of adhesions and small bowel resection 3. Distal esophagitis 4. gastritis  Discharge Medications:   Medication List     As of 05/04/2012  9:42 AM    TAKE these medications         alendronate 70 MG tablet   Commonly known as: FOSAMAX   Take 70 mg by mouth every 7 (seven) days. Take with a full glass of water on an empty stomach.      aspirin 81 MG tablet   Take 1 tablet (81 mg total) by mouth daily.      benazepril 10 MG tablet   Commonly known as: LOTENSIN   Take 1 tablet (10 mg total) by mouth daily.      docusate sodium 100 MG capsule   Commonly known as: COLACE   Take 100 mg by mouth daily as needed. constipation      HYDROcodone-acetaminophen 5-325 MG per tablet   Commonly known as: NORCO/VICODIN   Take 1-2 tablets by mouth every 4 (four) hours as needed for pain.      multivitamin with minerals Tabs   Take 1 tablet by mouth daily.  sertraline 50 MG tablet   Commonly known as: ZOLOFT   Take 50 mg by mouth daily.      simvastatin 20 MG tablet   Commonly known as: ZOCOR   TAKE 1 TABLET BY MOUTH AT BEDTIME        Discharge Instructions:     Follow-up Information    Follow up with WYATT, Marta Lamas, MD. Schedule an appointment as soon as possible for a visit in 2 weeks.   Contact information:   [address] [place] [address] [phone]          Signed: Letha Cape 05/04/2012, 9:42 AM   ADDENDUM: DC DX: Severe protein calorie malnutrition

## 2012-05-05 MED ORDER — GI COCKTAIL ~~LOC~~
30.0000 mL | Freq: Three times a day (TID) | ORAL | Status: DC | PRN
  Filled 2012-05-05: qty 30

## 2012-05-05 MED ORDER — GI COCKTAIL ~~LOC~~
30.0000 mL | Freq: Once | ORAL | Status: AC
  Administered 2012-05-05: 30 mL via ORAL
  Filled 2012-05-05: qty 30

## 2012-05-05 NOTE — Progress Notes (Addendum)
8 Days Post-Op  Subjective: Pt states he was doing well until diet advanced to solids at lunch yesterday. When he started taking solids, he developed a sensation of pain in chest that moved down into upper abd that lasted for a few seconds. Says it's hard to explain. Not a pressure or burning. Discomfort happens primarily with every solid intake. Liquids too a degree but not as bad. +flatus. BM yesterday. No sob, neck/arm pain.  Objective: Vital signs in last 24 hours: Temp:  [98.3 F (36.8 C)-99.5 F (37.5 C)] 99.5 F (37.5 C) (11/28 0609) Pulse Rate:  [77-79] 77  (11/28 0609) Resp:  [16-18] 16  (11/28 0609) BP: (112-130)/(64-75) 112/64 mmHg (11/28 0609) SpO2:  [96 %-98 %] 97 % (11/28 0609) Last BM Date: 05/03/12  Intake/Output from previous day: 11/27 0701 - 11/28 0700 In: 600 [P.O.:360; I.V.:240] Out: 425 [Urine:425] Intake/Output this shift:    Alert, nad cta Soft, nd, nt. Incision c/d/i  Lab Results:  No results found for this basename: WBC:2,HGB:2,HCT:2,PLT:2 in the last 72 hours BMET No results found for this basename: NA:2,K:2,CL:2,CO2:2,GLUCOSE:2,BUN:2,CREATININE:2,CALCIUM:2 in the last 72 hours PT/INR No results found for this basename: LABPROT:2,INR:2 in the last 72 hours ABG No results found for this basename: PHART:2,PCO2:2,PO2:2,HCO3:2 in the last 72 hours  Studies/Results: No results found.  Anti-infectives: Anti-infectives     Start     Dose/Rate Route Frequency Ordered Stop   04/28/12 1200   cefOXitin (MEFOXIN) 1 g in dextrose 5 % 50 mL IVPB        1 g 100 mL/hr over 30 Minutes Intravenous Every 6 hours 04/28/12 1032 04/28/12 1739   04/27/12 1900   cefOXitin (MEFOXIN) 1 g in dextrose 5 % 50 mL IVPB        1 g 100 mL/hr over 30 Minutes Intravenous  Once 04/27/12 1850 04/27/12 2113   04/27/12 0645   cefOXitin (MEFOXIN) 2 g in dextrose 5 % 50 mL IVPB        2 g 100 mL/hr over 30 Minutes Intravenous On call to O.R. 04/27/12 9528 04/27/12 0813          Assessment/Plan: s/p Procedure(s) (LRB) with comments: EXPLORATORY LAPAROTOMY (N/A)  Not sure of etiology of discomfort with swallowing solids - highly doubt cardiac origin; cont protonix oral, try GI cocktail. Esophageal spasm? Reflux? Gastritis/esophagitis? If protonix and GI cocktail don't help, will change diet to soft mechanical.  Vitals stable Encouraged pt to take small bites and chew thoroughly Reassess after lunch  Mary Sella. Andrey Campanile, MD, FACS General, Bariatric, & Minimally Invasive Surgery [facility] Surgery, [place]   LOS: 11 days    Louis Reid 05/05/2012

## 2012-05-06 MED ORDER — BOOST / RESOURCE BREEZE PO LIQD: 1.0000 | ORAL | Status: DC

## 2012-05-06 MED ORDER — BIOTENE DRY MOUTH MT LIQD: 15.0000 mL | OROMUCOSAL | Status: DC | PRN

## 2012-05-06 MED ORDER — ENSURE COMPLETE PO LIQD
237.0000 mL | Freq: Two times a day (BID) | ORAL | Status: DC
  Administered 2012-05-06 – 2012-05-09 (×4): 237 mL via ORAL

## 2012-05-06 MED ORDER — SUCRALFATE 1 GM/10ML PO SUSP
1.0000 g | Freq: Three times a day (TID) | ORAL | Status: DC
  Administered 2012-05-06 – 2012-05-09 (×10): 1 g via ORAL
  Filled 2012-05-06 (×16): qty 10

## 2012-05-06 NOTE — Progress Notes (Signed)
Patient ID: Louis Reid, male   DOB: [birth year], 78 y.o.   MRN: [id number] 9 Days Post-Op  Subjective: Pt still has this pressure when swallowing food or liquids.  It is not a "pain", just pressure sensation.  He has no difficulty swallowing food.  He is otherwise doing well.  Objective: Vital signs in last 24 hours: Temp:  [98.4 F (36.9 C)-98.8 F (37.1 C)] 98.6 F (37 C) (11/29 4098) Pulse Rate:  [70-88] 70  (11/29 0623) Resp:  [16-18] 17  (11/29 0623) BP: (106-128)/(66-71) 128/68 mmHg (11/29 0623) SpO2:  [96 %-98 %] 96 % (11/29 0623) Last BM Date: 05/03/12  Intake/Output from previous day: 11/28 0701 - 11/29 0700 In: 1058 [P.O.:480; I.V.:578] Out: 920 [Urine:920] Intake/Output this shift:    PE: Abd: soft, NT, ND, +BS, incision c/d/i Mouth: no evidence of oral thrush  Lab Results:  No results found for this basename: WBC:2,HGB:2,HCT:2,PLT:2 in the last 72 hours BMET No results found for this basename: NA:2,K:2,CL:2,CO2:2,GLUCOSE:2,BUN:2,CREATININE:2,CALCIUM:2 in the last 72 hours PT/INR No results found for this basename: LABPROT:2,INR:2 in the last 72 hours CMP     Component Value Date/Time   NA 144 05/02/2012 0500   K 4.1 05/02/2012 0500   CL 103 05/02/2012 0500   CO2 27 05/02/2012 0500   GLUCOSE 96 05/02/2012 0500   BUN 26* 05/02/2012 0500   CREATININE 0.85 05/02/2012 0500   CALCIUM 8.6 05/02/2012 0500   PROT 5.8* 05/02/2012 0500   ALBUMIN 2.7* 05/02/2012 0500   AST 20 05/02/2012 0500   ALT 15 05/02/2012 0500   ALKPHOS 58 05/02/2012 0500   BILITOT 0.6 05/02/2012 0500   GFRNONAA 82* 05/02/2012 0500   GFRAA >90 05/02/2012 0500   Lipase     Component Value Date/Time   LIPASE 29 04/24/2012 0742       Studies/Results: No results found.  Anti-infectives: Anti-infectives     Start     Dose/Rate Route Frequency Ordered Stop   04/28/12 1200   cefOXitin (MEFOXIN) 1 g in dextrose 5 % 50 mL IVPB        1 g 100 mL/hr over 30 Minutes Intravenous Every  6 hours 04/28/12 1032 04/28/12 1739   04/27/12 1900   cefOXitin (MEFOXIN) 1 g in dextrose 5 % 50 mL IVPB        1 g 100 mL/hr over 30 Minutes Intravenous  Once 04/27/12 1850 04/27/12 2113   04/27/12 0645   cefOXitin (MEFOXIN) 2 g in dextrose 5 % 50 mL IVPB        2 g 100 mL/hr over 30 Minutes Intravenous On call to O.R. 04/27/12 1191 04/27/12 0813           Assessment/Plan  1. S/p ex lap with SBR 2. Likely throat irritation secondary to NGT   Plan:  1. Will add Carafate QID today to see if this helps his throat pressure feeling. 2. Do not see any evidence of oral thrush, but he could have some esophageal candida.  May need to add swish and swallow.  LOS: 12 days    Allyssa Abruzzese E 05/06/2012, 9:01 AM Pager: [phone]

## 2012-05-06 NOTE — Progress Notes (Signed)
Reports sensation was better at lunch then rough at dinner and better this am but still present  abd soft, nt, nd  Probable esophageal/gastric irritation from prolonged ng Cont protonix Cont gi cocktail prn Add carafate Will change diet to soft mechanical Check labs in AM  Mary Sella. Andrey Campanile, MD, FACS General, Bariatric, & Minimally Invasive Surgery [facility] Surgery, [place]

## 2012-05-07 DIAGNOSIS — R1310 Dysphagia, unspecified: Secondary | ICD-10-CM

## 2012-05-07 LAB — CBC
MCH: 31.2 pg (ref 26.0–34.0)
MCHC: 33 g/dL (ref 30.0–36.0)
MCV: 94.4 fL (ref 78.0–100.0)
Platelets: 296 10*3/uL (ref 150–400)

## 2012-05-07 LAB — BASIC METABOLIC PANEL
BUN: 14 mg/dL (ref 6–23)
CO2: 28 mEq/L (ref 19–32)
Calcium: 8.4 mg/dL (ref 8.4–10.5)
Creatinine, Ser: 0.87 mg/dL (ref 0.50–1.35)
GFR calc non Af Amer: 81 mL/min — ABNORMAL LOW (ref >90)
Glucose, Bld: 101 mg/dL — ABNORMAL HIGH (ref 70–99)

## 2012-05-07 MED ORDER — NYSTATIN 100000 UNIT/ML MT SUSP
5.0000 mL | Freq: Four times a day (QID) | OROMUCOSAL | Status: DC
  Administered 2012-05-07: 500000 [IU] via ORAL
  Filled 2012-05-07 (×8): qty 5

## 2012-05-07 MED ORDER — SODIUM CHLORIDE 0.45 % IV SOLN: INTRAVENOUS | Status: DC

## 2012-05-07 MED ORDER — FLUCONAZOLE 200 MG PO TABS
200.0000 mg | ORAL_TABLET | Freq: Every day | ORAL | Status: DC
  Administered 2012-05-07 – 2012-05-09 (×3): 200 mg via ORAL
  Filled 2012-05-07 (×3): qty 1

## 2012-05-07 MED ORDER — POLYETHYLENE GLYCOL 3350 17 G PO PACK
17.0000 g | PACK | Freq: Once | ORAL | Status: DC
  Filled 2012-05-07: qty 1

## 2012-05-07 NOTE — Progress Notes (Signed)
10 Days Post-Op  Subjective: Pt felling okay today, still c/o of that strange feeling in his throat to his epigastric area.  Pt denies N/V, choking, hoarseness, regurgitation, or feeling like food is getting stuck.  The food goes down fine, but the sensation makes him hessistant to eat.  Pt is urinating and +flatus, no BM yet.  Pt is ambulating OOB.  Objective: Vital signs in last 24 hours: Temp:  [98.4 F (36.9 C)-98.9 F (37.2 C)] 98.4 F (36.9 C) (11/30 0515) Pulse Rate:  [80-87] 80  (11/30 0515) Resp:  [16-18] 16  (11/30 0515) BP: (98-116)/(59-74) 112/74 mmHg (11/30 0515) SpO2:  [98 %] 98 % (11/30 0515) Last BM Date: 05/03/12  Intake/Output from previous day: 11/29 0701 - 11/30 0700 In: 1061 [P.O.:600; I.V.:461] Out: 1050 [Urine:1050] Intake/Output this shift:    PE: Gen:  Alert, NAD, pleasant Mouth:  Whitish coating on tongue, does not come off when scraped, no gross OP erythema or exudates Card:  RRR Pulm:  CTA, no W/R/R Abd: Soft, NT/ND, +BS, no HSM, incisions C/D/I  Lab Results:   Basename 05/07/12 0640  WBC 7.0  HGB 11.2*  HCT 33.9*  PLT 296   BMET  Basename 05/07/12 0640  NA 138  K 4.0  CL 104  CO2 28  GLUCOSE 101*  BUN 14  CREATININE 0.87  CALCIUM 8.4   PT/INR No results found for this basename: LABPROT:2,INR:2 in the last 72 hours CMP     Component Value Date/Time   NA 138 05/07/2012 0640   K 4.0 05/07/2012 0640   CL 104 05/07/2012 0640   CO2 28 05/07/2012 0640   GLUCOSE 101* 05/07/2012 0640   BUN 14 05/07/2012 0640   CREATININE 0.87 05/07/2012 0640   CALCIUM 8.4 05/07/2012 0640   PROT 5.8* 05/02/2012 0500   ALBUMIN 2.7* 05/02/2012 0500   AST 20 05/02/2012 0500   ALT 15 05/02/2012 0500   ALKPHOS 58 05/02/2012 0500   BILITOT 0.6 05/02/2012 0500   GFRNONAA 81* 05/07/2012 0640   GFRAA >90 05/07/2012 0640   Lipase     Component Value Date/Time   LIPASE 29 04/24/2012 0742       Studies/Results: No results  found.  Anti-infectives: Anti-infectives     Start     Dose/Rate Route Frequency Ordered Stop   05/07/12 1000   fluconazole (DIFLUCAN) tablet 200 mg        200 mg Oral Daily 05/07/12 0859     04/28/12 1200   cefOXitin (MEFOXIN) 1 g in dextrose 5 % 50 mL IVPB        1 g 100 mL/hr over 30 Minutes Intravenous Every 6 hours 04/28/12 1032 04/28/12 1739   04/27/12 1900   cefOXitin (MEFOXIN) 1 g in dextrose 5 % 50 mL IVPB        1 g 100 mL/hr over 30 Minutes Intravenous  Once 04/27/12 1850 04/27/12 2113   04/27/12 0645   cefOXitin (MEFOXIN) 2 g in dextrose 5 % 50 mL IVPB        2 g 100 mL/hr over 30 Minutes Intravenous On call to O.R. 04/27/12 1610 04/27/12 0813           Assessment/Plan 1. S/p ex lap with SBR  2. Likely throat irritation secondary to NGT, possibly oral/pharyngeal/esophageal thrush  Plan:  1. Cont Carafate QID, GI cocktail and add diflucan and nystatin swish/swallow, ordered SLP eval and puree diet 2. Plan was for discharge today, will come evaluate the patient  later this afternoon 3.  Cont ambulation and IS 4.  Pain from surgery otherwise controlled    LOS: 13 days    DORT, [place] M[place] 05/07/2012, 9:00 AM Pager: [phone]

## 2012-05-07 NOTE — Consult Note (Signed)
Patient seen, examined, and I agree with the above documentation, including the assessment and plan. Symptoms of odynophagia not responding to PPI, GI cocktail, sucralfate.  Fluconazole added today empirically.  Happens with every swallow, mid chest discomfort that he has difficult time describing. EGD from 02/2012 with abnl mid-esophageal mucosa, not ulcerated, and path unremarkable. Given previously abnormality and persistence of symptoms now, I recommend repeat EGD. EGD could not be performed today because he has eaten Plan EGD tomorrow.  NPO after MN The nature of the procedure, as well as the risks, benefits, and alternatives were carefully and thoroughly reviewed with the patient. Ample time for discussion and questions allowed. The patient understood, was satisfied, and agreed to proceed.

## 2012-05-07 NOTE — Progress Notes (Signed)
Pt states he is still having same sensation - no worse or better. Change in diet didn't affect it. Afebrile. VSS. Labs today ok Oropharynx - clear Soft, nt, nd  Not sure of etiology of discomfort with solids and liquids. Labs and vitals are reassuring.  Reviewed EGD by Dr Leone Payor from 02/2012 - there was question of mid-distal esophageal mucosal irregularity - ?esophagitis but biopsies were normal.   Add nystatin swish and swallow.  Will consult GI  Mary Sella. Andrey Campanile, MD, FACS General, Bariatric, & Minimally Invasive Surgery [facility], [place]

## 2012-05-07 NOTE — Consult Note (Signed)
[facility] Consultation  Referring Provider: [facility] Primary Care Physician:  [name], MD Primary Gastroenterologist: Stan Head, MD    Reason for Consultation: Odynophagia   HPI: Louis Reid is a 78 y.o. male admitted 11/17 with SBO.  On 11/20 he underwent exploratory lap / LOA and excisional biopsy of skin lesions. After NGT pulled a few days ago and diet advanced to solids patient began having odynophagia. It is uncomfortable to swallow liquids but solids the most painful. GI cocktail yesterday didn't help, he hated it. He has been on BID PPI for a week now. He was stated on TID Carafate yesterday and today he was started on Diflucan. So far, no improvement in symptoms.  Pain starts a couple of seconds after a bite of food. Pain is from top of sternum down to epigastrium Pain is horrible. Pain subsides after a few seconds but recurs after next bite.    Past Medical History  Diagnosis Date  . Acute myocardial infarction of anterolateral wall, episode of care unspecified   . Coronary atherosclerosis of artery bypass graft   . Sinoatrial node dysfunction   . HTN (hypertension)   . HLD (hyperlipidemia)   . Depression   . History of kidney stones   . Congenital funnel chest   . Inguinal hernia   . Osteoporosis     Past Surgical History  Procedure Date  . Coronary artery bypass graft 1996  . Inguinal hernia repair 1996    right, hx  . Transurethral resection of prostate   . Ptca     percutaneous transluminal coronary intervention and brachy therapy, Bruce R. Juanda Chance, MD. EF 60%  . Laparotomy 04/27/2012    Procedure: EXPLORATORY LAPAROTOMY;  Surgeon: Cherylynn Ridges, MD;  Location: [facility] OR;  Service: General;  Laterality: N/A;    Family History  Problem Relation Age of Onset  . Colon cancer Brother   . Diabetes Brother   . Heart disease Father   . Heart disease Brother   . Other Mother     brain tumor    History  Substance Use  Topics  . Smoking status: Former Games developer  . Smokeless tobacco: Never Used     Comment: quit in the 1960's  . Alcohol Use: No    Prior to Admission medications   Medication Sig Start Date End Date Taking? Authorizing Provider  alendronate (FOSAMAX) 70 MG tablet Take 70 mg by mouth every 7 (seven) days. Take with a full glass of water on an empty stomach.   Yes Historical Provider, MD  aspirin 81 MG tablet Take 1 tablet (81 mg total) by mouth daily. 07/09/11  Yes Gaylord Shih, MD  benazepril (LOTENSIN) 10 MG tablet Take 1 tablet (10 mg total) by mouth daily. 11/24/11  Yes Gaylord Shih, MD  docusate sodium (COLACE) 100 MG capsule Take 100 mg by mouth daily as needed. constipation   Yes Historical Provider, MD  Multiple Vitamin (MULTIVITAMIN WITH MINERALS) TABS Take 1 tablet by mouth daily.   Yes Historical Provider, MD  sertraline (ZOLOFT) 50 MG tablet Take 50 mg by mouth daily.   Yes Historical Provider, MD  simvastatin (ZOCOR) 20 MG tablet TAKE 1 TABLET BY MOUTH AT BEDTIME 09/18/11  Yes Gaylord Shih, MD  HYDROcodone-acetaminophen (NORCO) 5-325 MG per tablet Take 1-2 tablets by mouth every 4 (four) hours as needed for pain. 05/04/12   Letha Cape, PA    Current Facility-Administered Medications  Medication Dose Route Frequency  Provider Last Rate Last Dose  . 0.45 % NaCl with KCl 20 mEq / L infusion   Intravenous Continuous Shelly Rubenstein, MD 20 mL/hr at 05/05/12 2200    . antiseptic oral rinse (BIOTENE) solution 15 mL  15 mL Mouth Rinse PRN Letha Cape, PA      . diphenhydrAMINE (BENADRYL) capsule 25 mg  25 mg Oral QHS PRN Almond Lint, MD      . diphenhydrAMINE (BENADRYL) injection 12.5 mg  12.5 mg Intravenous Q6H PRN Cherylynn Ridges, MD      . diphenhydrAMINE (BENADRYL) injection 25 mg  25 mg Intravenous QHS PRN Almond Lint, MD   25 mg at 05/01/12 0430  . enoxaparin (LOVENOX) injection 40 mg  40 mg Subcutaneous Q24H Freeman Caldron, PA   40 mg at 05/06/12 2204  . feeding  supplement (ENSURE COMPLETE) liquid 237 mL  237 mL Oral BID BM Jeoffrey Massed, RD   237 mL at 05/06/12 2203  . feeding supplement (RESOURCE BREEZE) liquid 1 Container  1 Container Oral Q24H Jeoffrey Massed, RD      . fluconazole (DIFLUCAN) tablet 200 mg  200 mg Oral Daily Megan Dort, [name]      . gi cocktail (Maalox,Lidocaine,Donnatal)  30 mL Oral TID PRN Atilano Ina, MD,FACS      . morphine 2 MG/ML injection 2-4 mg  2-4 mg Intravenous Q2H PRN Ozella Rocks, MD      . naloxone [facility]) injection 0.4 mg  0.4 mg Intravenous PRN Cherylynn Ridges, MD       And  . sodium chloride 0.9 % injection 9 mL  9 mL Intravenous PRN Cherylynn Ridges, MD      . nystatin (MYCOSTATIN) 100000 UNIT/ML suspension 500,000 Units  5 mL Oral QID Megan Dort, [name]      . ondansetron (ZOFRAN) injection 4 mg  4 mg Intravenous Q6H PRN Cherylynn Ridges, MD      . pantoprazole (PROTONIX) EC tablet 40 mg  40 mg Oral BID AC Christella Hartigan, PHARMD   40 mg at 05/07/12 0849  . phenol (CHLORASEPTIC) mouth spray 1 spray  1 spray Mouth/Throat PRN Sherrie George, PA   1 spray at 04/26/12 2134  . polyethylene glycol (MIRALAX / GLYCOLAX) packet 17 g  17 g Oral Once Atilano Ina, MD,FACS      . sertraline (ZOLOFT) tablet 50 mg  50 mg Oral Daily Letha Cape, PA   50 mg at 05/06/12 1018  . sucralfate (CARAFATE) 1 GM/10ML suspension 1 g  1 g Oral TID WC & HS Letha Cape, PA   1 g at 05/07/12 0848  . white petrolatum (VASELINE) gel   Topical PRN Letha Cape, PA      . [DISCONTINUED] antiseptic oral rinse (BIOTENE) solution 15 mL  15 mL Mouth Rinse q12n4p Thomas A. Cornett, MD   15 mL at 05/04/12 1600  . [DISCONTINUED] chlorhexidine (PERIDEX) 0.12 % solution 15 mL  15 mL Mouth Rinse BID Thomas A. Cornett, MD   15 mL at 05/06/12 0827    Allergies as of 04/24/2012 - Review Complete 04/24/2012  Allergen Reaction Noted  . Cold medicine plus (chlorphen-pseudoephed-apap)       Review of Systems:    All systems reviewed and negative  except where noted in HPI.   PHYSICAL EXAM: Vital signs in last 24 hours: Temp:  [98.4 F (36.9 C)-98.9 F (37.2 C)] 98.4 F (36.9 C) (11/30  0515) Pulse Rate:  [80-87] 80  (11/30 0515) Resp:  [16-18] 16  (11/30 0515) BP: (98-116)/(59-74) 112/74 mmHg (11/30 0515) SpO2:  [98 %] 98 % (11/30 0515) Last BM Date: 05/03/12 General:   Pleasant think white male in NAD Head:  Normocephalic and atraumatic. Eyes:   No icterus.   Conjunctiva pink. Ears:  Normal auditory acuity. Neck:  Supple; no masses felt Lungs:  Respirations even and unlabored. Lungs clear to auscultation bilaterally.   No wheezes, crackles, or rhonchi.  Heart:  Regular rate and rhythm Abdomen:  Soft, mildly distended, mild RLQ tenderness.  Normal bowel sounds. No appreciable masses or hepatomegaly. Midline abdominal incision with staples.  . Msk:  Symmetrical without gross deformities.  Extremities:  Without edema. Neurologic:  Alert and  oriented x4;  grossly normal neurologically. Skin:  Intact without significant lesions or rashes. Cervical Nodes:  No significant cervical adenopathy. Psych:  Alert and cooperative. Normal affect.  LAB RESULTS:  Basename 05/07/12 0640  WBC 7.0  HGB 11.2*  HCT 33.9*  PLT 296   BMET  Basename 05/07/12 0640  NA 138  K 4.0  CL 104  CO2 28  GLUCOSE 101*  BUN 14  CREATININE 0.87  CALCIUM 8.4    PREVIOUS ENDOSCOPIES: EGD September 2013. Findings: questions esophagitis middle third of esophagus but biopsies were negative.   Colonoscopy September 2013. Findings: normal.   IMPRESSION / PLAN: 1. Odynophagia, ?etiology. Possibly candida esophagitis secondary to antibiotics. Rule out esophageal ulcer. Patient needs EGD for further evaluation. He ate breakfast this am so will need to plan for am EGD to be done. Dr. Loreta Ave will perform procedure as she will be covering for our practice tomorrow.   2. Jejunal diverticulosis  3. Multiple medical problems. Patient looks and feels  relatively well.    Thanks   LOS: 13 days   Willette Cluster  05/07/2012, 10:33 AM

## 2012-05-08 ENCOUNTER — Encounter (HOSPITAL_COMMUNITY): Payer: Self-pay | Admitting: *Deleted

## 2012-05-08 ENCOUNTER — Encounter (HOSPITAL_COMMUNITY): Admission: EM | Disposition: A | Discharge status: Home / Self Care | Payer: Self-pay | Source: Home / Self Care

## 2012-05-08 DIAGNOSIS — I4901 Ventricular fibrillation: Secondary | ICD-10-CM

## 2012-05-08 HISTORY — DX: Ventricular fibrillation: I49.01

## 2012-05-08 HISTORY — PX: ESOPHAGOGASTRODUODENOSCOPY: SHX5428

## 2012-05-08 SURGERY — EGD (ESOPHAGOGASTRODUODENOSCOPY)
Anesthesia: Moderate Sedation

## 2012-05-08 MED ORDER — MIDAZOLAM HCL 10 MG/2ML IJ SOLN
INTRAMUSCULAR | Status: DC | PRN
  Administered 2012-05-08 (×2): 2.5 mg via INTRAVENOUS

## 2012-05-08 MED ORDER — DIPHENHYDRAMINE HCL 50 MG/ML IJ SOLN
INTRAMUSCULAR | Status: AC
  Filled 2012-05-08: qty 1

## 2012-05-08 MED ORDER — FENTANYL CITRATE 0.05 MG/ML IJ SOLN
INTRAMUSCULAR | Status: AC
  Filled 2012-05-08: qty 2

## 2012-05-08 MED ORDER — FENTANYL CITRATE 0.05 MG/ML IJ SOLN
INTRAMUSCULAR | Status: DC | PRN
  Administered 2012-05-08 (×2): 25 ug via INTRAVENOUS

## 2012-05-08 MED ORDER — MIDAZOLAM HCL 5 MG/ML IJ SOLN
INTRAMUSCULAR | Status: AC
  Filled 2012-05-08: qty 1

## 2012-05-08 NOTE — Progress Notes (Signed)
Same symptoms.  +bm.  abd exam stable  For EGD today Appreciate GI assistance  Mary Sella. Andrey Campanile, MD, FACS General, Bariatric, & Minimally Invasive Surgery [facility], [place]

## 2012-05-08 NOTE — Op Note (Signed)
[facility] [address] [place], [address]   OPERATIVE PROCEDURE REPORT  PATIENT :Louis Reid, Louis Reid  MR#: [id number] BIRTHDATE :10-May-1933 GENDER: Male ENDOSCOPIST: Dr.  Lorenza Burton, MD ASSISTANT:   Darral Dash, RN & Windell Hummingbird, technician. PROCEDURE DATE: May 25, 2012 PRE-PROCEDURE PREPERATION: Patient fasted for 8 hours prior to the procedure.  PRE-PROCEDURE PHYSICAL: Patient has stable vital signs.  Neck is supple.  There is no JVD, thyromegaly or LAD.  Chest clear to auscultation.  S1 and S2 irregular.  Abdomen soft, non-distended, non-tender with NABS. PROCEDURE:     EGD, diagnostic ASA CLASS:     Class III INDICATIONS:     Odynophagia. MEDICATIONS:     Fentanyl 50 mcg and Versed 5 mg IV TOPICAL ANESTHETIC:  None given.  DESCRIPTION OF PROCEDURE: After the risks benefits and alternatives of the procedure were thoroughly explained, informed consent was obtained.  The Pentax Gastroscope [id number]  was introduced through the mouth and advanced to the second portion of the duodenum , without limitations. The instrument was slowly withdrawn as the mucosa was fully examined.   The esophagus and the GEJ were widely patent with a Grade III distal esophagitis. Patchy gastritis was noted in the stomach. The rest of the stomach and the proximal small bowel appeared normal. There were no ulcers, erosions, masses or polyps noted. Retroflexed views revealed no abnormalities. The scope was then withdrawn from the patient and the procedure terminated. The patient tolerated the procedure without immediate complications.  IMPRESSION: 1) Grade III distal esophagitis. 2) Patchy gastritis. 3) Normal proximal small bowel.  RECOMMENDATIONS:     1.  Anti-reflux regimen to be followed. 2.  Avoid NSAIDS for now. 3. STOP FOSAMAX & Fluconazole. 4. Continue PPI's and Carafate. REPEAT EXAM:  None planned for now.  DISCHARGE INSTRUCTIONS: Standard  instructions given.  _______________________________ eSigned:  Dr. Lorenza Burton, MD May 25, 2012 1:39 PM   CPT CODES:     57846  DIAGNOSIS CODES:     530.89, 535.00, 530.11   CC: Iva Boop, M.D, [facility] Gaynelle Adu, M.D.  PATIENT NAME:  Louis Reid, Louis Reid MR#: [id number]

## 2012-05-08 NOTE — Progress Notes (Signed)
11 Days Post-Op  Subjective: Pt feels the same today, still c/o of the same throat to epigastric pain sensation.  Pt pending EGD today.  Pt states the swish and swallow really burned going down and he doesn't want to take that unless he has to.  No abdominal pain, n/v.  Pt up ambulating and tolerating diet.  Objective: Vital signs in last 24 hours: Temp:  [98.4 F (36.9 C)-99.2 F (37.3 C)] 99.2 F (37.3 C) (12/01 0531) Pulse Rate:  [74-83] 74  (12/01 0531) Resp:  [16-17] 16  (12/01 0531) BP: (122-139)/(63-76) 122/76 mmHg (12/01 0531) SpO2:  [98 %-99 %] 98 % (12/01 0531) Last BM Date: 05/07/12  Intake/Output from previous day: 11/30 0701 - 12/01 0700 In: 1339 [P.O.:840; I.V.:499] Out: 1200 [Urine:1200] Intake/Output this shift:    PE: Gen:  Alert, NAD, pleasant Abd: Soft, NT/ND, +BS, no HSM, incisions C/D/I   Lab Results:   Basename 05/07/12 0640  WBC 7.0  HGB 11.2*  HCT 33.9*  PLT 296   BMET  Basename 05/07/12 0640  NA 138  K 4.0  CL 104  CO2 28  GLUCOSE 101*  BUN 14  CREATININE 0.87  CALCIUM 8.4   PT/INR No results found for this basename: LABPROT:2,INR:2 in the last 72 hours CMP     Component Value Date/Time   NA 138 05/07/2012 0640   K 4.0 05/07/2012 0640   CL 104 05/07/2012 0640   CO2 28 05/07/2012 0640   GLUCOSE 101* 05/07/2012 0640   BUN 14 05/07/2012 0640   CREATININE 0.87 05/07/2012 0640   CALCIUM 8.4 05/07/2012 0640   PROT 5.8* 05/02/2012 0500   ALBUMIN 2.7* 05/02/2012 0500   AST 20 05/02/2012 0500   ALT 15 05/02/2012 0500   ALKPHOS 58 05/02/2012 0500   BILITOT 0.6 05/02/2012 0500   GFRNONAA 81* 05/07/2012 0640   GFRAA >90 05/07/2012 0640   Lipase     Component Value Date/Time   LIPASE 29 04/24/2012 0742       Studies/Results: No results found.  Anti-infectives: Anti-infectives     Start     Dose/Rate Route Frequency Ordered Stop   05/07/12 1000   fluconazole (DIFLUCAN) tablet 200 mg        200 mg Oral Daily 05/07/12  0859     04/28/12 1200   cefOXitin (MEFOXIN) 1 g in dextrose 5 % 50 mL IVPB        1 g 100 mL/hr over 30 Minutes Intravenous Every 6 hours 04/28/12 1032 04/28/12 1739   04/27/12 1900   cefOXitin (MEFOXIN) 1 g in dextrose 5 % 50 mL IVPB        1 g 100 mL/hr over 30 Minutes Intravenous  Once 04/27/12 1850 04/27/12 2113   04/27/12 0645   cefOXitin (MEFOXIN) 2 g in dextrose 5 % 50 mL IVPB        2 g 100 mL/hr over 30 Minutes Intravenous On call to O.R. 04/27/12 1610 04/27/12 0813           Assessment/Plan 1. S/p ex lap with SBR  2. Likely throat irritation secondary to NGT, possibly oral/pharyngeal/esophageal thrush   Plan:  1. Cont Carafate QID, GI cocktail, diflucan and nystatin swish/swallow, pending EGD by GI 2.  If EGD is normal will likely be discharged today or tomorrow 3. Cont ambulation and IS  4. Pain from surgery otherwise controlled, likely can get staples out tomorrow day 12    LOS: 14 days    DORT,  Korrine Sicard 05/08/2012, 9:04 AM Pager: [phone]

## 2012-05-09 MED ORDER — SUCRALFATE 1 GM/10ML PO SUSP: 1.0000 g | Freq: Three times a day (TID) | ORAL | Status: DC

## 2012-05-09 MED ORDER — PANTOPRAZOLE SODIUM 40 MG PO TBEC: 40.0000 mg | DELAYED_RELEASE_TABLET | Freq: Two times a day (BID) | ORAL | Status: DC

## 2012-05-09 NOTE — Progress Notes (Signed)
Patient discharged to home with son.  Staples removed from midline incision as ordered, steri strips applied.  Discharge teaching completed including follow up care, medications, signs and symptoms of infection.  Patient verbalizes understanding with no further questions.  Discharged per wheelchair with son. Vital signs stable, no complaints of pain.

## 2012-05-09 NOTE — Progress Notes (Signed)
     [facility] Daily Rounding Note 05/09/2012, 10:54 AM  SUBJECTIVE:       Going home today.  Swallowing is better. Less pain, smaller area involved after he swallows. On D 3 diet.   OBJECTIVE:         Vital signs in last 24 hours:    Temp:  [98.4 F (36.9 C)-98.9 F (37.2 C)] 98.5 F (36.9 C) (12/02 0600) Pulse Rate:  [68-83] 72  (12/02 0600) Resp:  [14-30] 19  (12/02 0600) BP: (109-150)/(57-88) 120/74 mmHg (12/02 0600) SpO2:  [97 %-99 %] 98 % (12/02 0600) Last BM Date: 05/07/12 General: thin.     Heart: RRR Chest: clearB Abdomen: soft, active BS, NT, ND  Extremities: no edema Neuro/Psych:  Oriented x3, talkative, appropriate  Intake/Output from previous day: 12/01 0701 - 12/02 0700 In: 480 [I.V.:480] Out: 400 [Urine:400]  Intake/Output this shift: Total I/O In: -  Out: 200 [Urine:200]  Lab Results:  [facility] 05/07/12 0640  WBC 7.0  HGB 11.2*  HCT 33.9*  PLT 296   BMET  Basename 05/07/12 0640  NA 138  K 4.0  CL 104  CO2 28  GLUCOSE 101*  BUN 14  CREATININE 0.87  CALCIUM 8.4   ASSESMENT: *  Odynophagia.  EGD 05/08/12: Grade 3 distal esophagitis, Gastritis.  Not on PPI PTA despite hx esophagitis 2 months ago on EGD September 2013  *  SBO:  11/20 Ex lap/LOA and biopsy of skin lesions.  ngt in place for several days pre/post surgery.     PLAN: *  Stay on PPI , generic Omeprazole ok, BID for 2 weeks, then once daily. Can RX Carafate BID before meals for one week *  Hold Fosamax until Dr Leone Payor says it is OK to restart.  *  Soft foods.  *  See prov Navigator for 1/2 /14 ROV with Dr Leone Payor.  *  Agree with home today.    LOS: 15 days   Jennye Moccasin  05/09/2012, 10:54 AM Pager: [phone]

## 2012-05-09 NOTE — Discharge Summary (Signed)
Agree with above 

## 2012-05-09 NOTE — Progress Notes (Signed)
Agree with avoe.

## 2012-05-09 NOTE — Progress Notes (Signed)
Patient ID: Louis Reid, male   DOB: 08/27/1932, 78 y.o.   MRN: [id number] 1 Day Post-Op  Subjective: Pt feels a little better in regards to his swallowing "pain"  Objective: Vital signs in last 24 hours: Temp:  [98.4 F (36.9 C)-98.9 F (37.2 C)] 98.5 F (36.9 C) (12/02 0600) Pulse Rate:  [68-83] 72  (12/02 0600) Resp:  [14-30] 19  (12/02 0600) BP: (109-150)/(57-88) 120/74 mmHg (12/02 0600) SpO2:  [97 %-99 %] 98 % (12/02 0600) Last BM Date: 05/07/12  Intake/Output from previous day: 12/01 0701 - 12/02 0700 In: 480 [I.V.:480] Out: 400 [Urine:400] Intake/Output this shift: Total I/O In: -  Out: 200 [Urine:200]  PE: Abd: soft, NT, ND, incision c/d/i with staples intact  Lab Results:   [facility] 05/07/12 0640  WBC 7.0  HGB 11.2*  HCT 33.9*  PLT 296   BMET  Basename 05/07/12 0640  NA 138  K 4.0  CL 104  CO2 28  GLUCOSE 101*  BUN 14  CREATININE 0.87  CALCIUM 8.4   PT/INR No results found for this basename: LABPROT:2,INR:2 in the last 72 hours CMP     Component Value Date/Time   NA 138 05/07/2012 0640   K 4.0 05/07/2012 0640   CL 104 05/07/2012 0640   CO2 28 05/07/2012 0640   GLUCOSE 101* 05/07/2012 0640   BUN 14 05/07/2012 0640   CREATININE 0.87 05/07/2012 0640   CALCIUM 8.4 05/07/2012 0640   PROT 5.8* 05/02/2012 0500   ALBUMIN 2.7* 05/02/2012 0500   AST 20 05/02/2012 0500   ALT 15 05/02/2012 0500   ALKPHOS 58 05/02/2012 0500   BILITOT 0.6 05/02/2012 0500   GFRNONAA 81* 05/07/2012 0640   GFRAA >90 05/07/2012 0640   Lipase     Component Value Date/Time   LIPASE 29 04/24/2012 0742       Studies/Results: No results found.  Anti-infectives: Anti-infectives     Start     Dose/Rate Route Frequency Ordered Stop   05/07/12 1000   fluconazole (DIFLUCAN) tablet 200 mg        200 mg Oral Daily 05/07/12 0859     04/28/12 1200   cefOXitin (MEFOXIN) 1 g in dextrose 5 % 50 mL IVPB        1 g 100 mL/hr over 30 Minutes Intravenous Every 6 hours  04/28/12 1032 04/28/12 1739   04/27/12 1900   cefOXitin (MEFOXIN) 1 g in dextrose 5 % 50 mL IVPB        1 g 100 mL/hr over 30 Minutes Intravenous  Once 04/27/12 1850 04/27/12 2113   04/27/12 0645   cefOXitin (MEFOXIN) 2 g in dextrose 5 % 50 mL IVPB        2 g 100 mL/hr over 30 Minutes Intravenous On call to O.R. 04/27/12 4098 04/27/12 0813           Assessment/Plan  1. S/p ex lap with SBR 2. Esophagitis and gastritis  Plan: 1. Will dc staples today 2. May dc home   LOS: 15 days    Sesar Madewell E 05/09/2012, 11:09 AM Pager: [phone]

## 2012-05-10 ENCOUNTER — Inpatient Hospital Stay (HOSPITAL_COMMUNITY)
Admission: EM | Admit: 2012-05-10 | Discharge: 2012-05-20 | DRG: 250 | Disposition: A | Discharge status: Rehabilitation Facility | Payer: Medicare Other | Attending: Internal Medicine | Admitting: Internal Medicine

## 2012-05-10 ENCOUNTER — Encounter (HOSPITAL_COMMUNITY)
Admission: EM | Disposition: A | Discharge status: Rehabilitation Facility | Payer: Self-pay | Source: Home / Self Care | Attending: Pulmonary Disease

## 2012-05-10 ENCOUNTER — Emergency Department (HOSPITAL_COMMUNITY): Payer: Medicare Other

## 2012-05-10 ENCOUNTER — Encounter (HOSPITAL_COMMUNITY): Payer: Self-pay | Admitting: Gastroenterology

## 2012-05-10 ENCOUNTER — Inpatient Hospital Stay (HOSPITAL_COMMUNITY): Payer: Medicare Other

## 2012-05-10 DIAGNOSIS — R630 Anorexia: Secondary | ICD-10-CM

## 2012-05-10 DIAGNOSIS — I2699 Other pulmonary embolism without acute cor pulmonale: Secondary | ICD-10-CM

## 2012-05-10 DIAGNOSIS — R570 Cardiogenic shock: Secondary | ICD-10-CM

## 2012-05-10 DIAGNOSIS — I509 Heart failure, unspecified: Secondary | ICD-10-CM | POA: Diagnosis present

## 2012-05-10 DIAGNOSIS — Z7982 Long term (current) use of aspirin: Secondary | ICD-10-CM

## 2012-05-10 DIAGNOSIS — E873 Alkalosis: Secondary | ICD-10-CM | POA: Diagnosis present

## 2012-05-10 DIAGNOSIS — I252 Old myocardial infarction: Secondary | ICD-10-CM

## 2012-05-10 DIAGNOSIS — J9600 Acute respiratory failure, unspecified whether with hypoxia or hypercapnia: Secondary | ICD-10-CM

## 2012-05-10 DIAGNOSIS — G931 Anoxic brain damage, not elsewhere classified: Secondary | ICD-10-CM | POA: Diagnosis present

## 2012-05-10 DIAGNOSIS — Z79899 Other long term (current) drug therapy: Secondary | ICD-10-CM

## 2012-05-10 DIAGNOSIS — M810 Age-related osteoporosis without current pathological fracture: Secondary | ICD-10-CM | POA: Diagnosis present

## 2012-05-10 DIAGNOSIS — I2109 ST elevation (STEMI) myocardial infarction involving other coronary artery of anterior wall: Principal | ICD-10-CM | POA: Diagnosis present

## 2012-05-10 DIAGNOSIS — I4901 Ventricular fibrillation: Secondary | ICD-10-CM

## 2012-05-10 DIAGNOSIS — E785 Hyperlipidemia, unspecified: Secondary | ICD-10-CM

## 2012-05-10 DIAGNOSIS — R0602 Shortness of breath: Secondary | ICD-10-CM

## 2012-05-10 DIAGNOSIS — I5021 Acute systolic (congestive) heart failure: Secondary | ICD-10-CM

## 2012-05-10 DIAGNOSIS — I10 Essential (primary) hypertension: Secondary | ICD-10-CM

## 2012-05-10 DIAGNOSIS — G9340 Encephalopathy, unspecified: Secondary | ICD-10-CM

## 2012-05-10 DIAGNOSIS — R1310 Dysphagia, unspecified: Secondary | ICD-10-CM

## 2012-05-10 DIAGNOSIS — I2510 Atherosclerotic heart disease of native coronary artery without angina pectoris: Secondary | ICD-10-CM

## 2012-05-10 DIAGNOSIS — J9601 Acute respiratory failure with hypoxia: Secondary | ICD-10-CM

## 2012-05-10 DIAGNOSIS — D631 Anemia in chronic kidney disease: Secondary | ICD-10-CM | POA: Diagnosis present

## 2012-05-10 DIAGNOSIS — R739 Hyperglycemia, unspecified: Secondary | ICD-10-CM

## 2012-05-10 DIAGNOSIS — R7309 Other abnormal glucose: Secondary | ICD-10-CM

## 2012-05-10 DIAGNOSIS — I469 Cardiac arrest, cause unspecified: Secondary | ICD-10-CM

## 2012-05-10 DIAGNOSIS — E0781 Sick-euthyroid syndrome: Secondary | ICD-10-CM | POA: Diagnosis present

## 2012-05-10 DIAGNOSIS — I213 ST elevation (STEMI) myocardial infarction of unspecified site: Secondary | ICD-10-CM | POA: Diagnosis present

## 2012-05-10 DIAGNOSIS — R5381 Other malaise: Secondary | ICD-10-CM

## 2012-05-10 DIAGNOSIS — I959 Hypotension, unspecified: Secondary | ICD-10-CM

## 2012-05-10 DIAGNOSIS — E876 Hypokalemia: Secondary | ICD-10-CM | POA: Diagnosis present

## 2012-05-10 DIAGNOSIS — Z87891 Personal history of nicotine dependence: Secondary | ICD-10-CM

## 2012-05-10 DIAGNOSIS — Z951 Presence of aortocoronary bypass graft: Secondary | ICD-10-CM

## 2012-05-10 DIAGNOSIS — Z9861 Coronary angioplasty status: Secondary | ICD-10-CM

## 2012-05-10 DIAGNOSIS — D649 Anemia, unspecified: Secondary | ICD-10-CM

## 2012-05-10 HISTORY — PX: LEFT HEART CATHETERIZATION WITH CORONARY ANGIOGRAM: SHX5451

## 2012-05-10 LAB — COMPREHENSIVE METABOLIC PANEL
AST: 46 U/L — ABNORMAL HIGH (ref 0–37)
Albumin: 2.7 g/dL — ABNORMAL LOW (ref 3.5–5.2)
Alkaline Phosphatase: 67 U/L (ref 39–117)
BUN: 20 mg/dL (ref 6–23)
CO2: 20 mEq/L (ref 19–32)
Chloride: 105 mEq/L (ref 96–112)
GFR calc non Af Amer: 70 mL/min — ABNORMAL LOW (ref >90)
Potassium: 4.6 mEq/L (ref 3.5–5.1)
Total Bilirubin: 0.2 mg/dL — ABNORMAL LOW (ref 0.3–1.2)

## 2012-05-10 LAB — CBC WITH DIFFERENTIAL/PLATELET
Basophils Absolute: 0 10*3/uL (ref 0.0–0.1)
Basophils Relative: 0 % (ref 0–1)
HCT: 33.7 % — ABNORMAL LOW (ref 39.0–52.0)
Hemoglobin: 10.9 g/dL — ABNORMAL LOW (ref 13.0–17.0)
Lymphocytes Relative: 11 % — ABNORMAL LOW (ref 12–46)
MCHC: 32.3 g/dL (ref 30.0–36.0)
Monocytes Relative: 6 % (ref 3–12)
Neutro Abs: 10.8 10*3/uL — ABNORMAL HIGH (ref 1.7–7.7)
Neutrophils Relative %: 82 % — ABNORMAL HIGH (ref 43–77)
WBC: 13.1 10*3/uL — ABNORMAL HIGH (ref 4.0–10.5)

## 2012-05-10 LAB — BASIC METABOLIC PANEL
BUN: 15 mg/dL (ref 6–23)
CO2: 17 mEq/L — ABNORMAL LOW (ref 19–32)
Calcium: 6.9 mg/dL — ABNORMAL LOW (ref 8.4–10.5)
Chloride: 108 mEq/L (ref 96–112)
Creatinine, Ser: 0.71 mg/dL (ref 0.50–1.35)
Glucose, Bld: 193 mg/dL — ABNORMAL HIGH (ref 70–99)

## 2012-05-10 LAB — GLUCOSE, CAPILLARY: Glucose-Capillary: 180 mg/dL — ABNORMAL HIGH (ref 70–99)

## 2012-05-10 LAB — PHOSPHORUS: Phosphorus: 4.5 mg/dL (ref 2.3–4.6)

## 2012-05-10 LAB — MAGNESIUM: Magnesium: 1.8 mg/dL (ref 1.5–2.5)

## 2012-05-10 SURGERY — LEFT HEART CATHETERIZATION WITH CORONARY ANGIOGRAM
Anesthesia: LOCAL

## 2012-05-10 MED ORDER — CISATRACURIUM BOLUS VIA INFUSION
0.1000 mg/kg | Freq: Once | INTRAVENOUS | Status: AC
  Administered 2012-05-10: 5.9 mg via INTRAVENOUS
  Filled 2012-05-10: qty 6

## 2012-05-10 MED ORDER — ASPIRIN 325 MG PO TABS
325.0000 mg | ORAL_TABLET | Freq: Every day | ORAL | Status: DC
  Administered 2012-05-11: 325 mg via ORAL
  Filled 2012-05-10: qty 1

## 2012-05-10 MED ORDER — BIOTENE DRY MOUTH MT LIQD
1.0000 "application " | Freq: Four times a day (QID) | OROMUCOSAL | Status: DC
  Administered 2012-05-11 – 2012-05-20 (×34): 15 mL via OROMUCOSAL

## 2012-05-10 MED ORDER — ASPIRIN 300 MG RE SUPP
300.0000 mg | RECTAL | Status: AC
  Administered 2012-05-10: 300 mg via RECTAL
  Filled 2012-05-10: qty 1

## 2012-05-10 MED ORDER — NOREPINEPHRINE BITARTRATE 1 MG/ML IJ SOLN
2.0000 ug/min | Freq: Once | INTRAVENOUS | Status: AC
  Administered 2012-05-10: 10 ug/min via INTRAVENOUS

## 2012-05-10 MED ORDER — MIDAZOLAM HCL 5 MG/ML IJ SOLN: 2.0000 mg | Freq: Once | INTRAMUSCULAR | Status: DC | PRN

## 2012-05-10 MED ORDER — CHLORHEXIDINE GLUCONATE 0.12 % MT SOLN
15.0000 mL | Freq: Two times a day (BID) | OROMUCOSAL | Status: DC
  Administered 2012-05-10 – 2012-05-13 (×6): 15 mL via OROMUCOSAL
  Filled 2012-05-10 (×7): qty 15

## 2012-05-10 MED ORDER — SIMVASTATIN 40 MG PO TABS
40.0000 mg | ORAL_TABLET | Freq: Every day | ORAL | Status: DC
  Administered 2012-05-11: 40 mg via ORAL
  Filled 2012-05-10 (×2): qty 1

## 2012-05-10 MED ORDER — ACETAMINOPHEN 325 MG PO TABS
650.0000 mg | ORAL_TABLET | ORAL | Status: DC | PRN
  Filled 2012-05-10: qty 2

## 2012-05-10 MED ORDER — CISATRACURIUM BOLUS VIA INFUSION
0.0500 mg/kg | Freq: Once | INTRAVENOUS | Status: AC | PRN
  Filled 2012-05-10: qty 3

## 2012-05-10 MED ORDER — PANTOPRAZOLE SODIUM 40 MG IV SOLR
40.0000 mg | Freq: Every day | INTRAVENOUS | Status: DC
  Administered 2012-05-10 – 2012-05-15 (×6): 40 mg via INTRAVENOUS
  Filled 2012-05-10 (×7): qty 40

## 2012-05-10 MED ORDER — NITROGLYCERIN 0.2 MG/ML ON CALL CATH LAB
INTRAVENOUS | Status: AC
  Filled 2012-05-10: qty 1

## 2012-05-10 MED ORDER — MIDAZOLAM HCL 5 MG/ML IJ SOLN: 2.0000 mg | Freq: Once | INTRAMUSCULAR | Status: DC

## 2012-05-10 MED ORDER — BIVALIRUDIN 250 MG IV SOLR
INTRAVENOUS | Status: AC
  Filled 2012-05-10: qty 250

## 2012-05-10 MED ORDER — FENTANYL CITRATE 0.05 MG/ML IJ SOLN
100.0000 ug | Freq: Once | INTRAMUSCULAR | Status: DC
Start: 2012-05-10 — End: 2012-05-10

## 2012-05-10 MED ORDER — SODIUM CHLORIDE 0.9 % IV SOLN
2000.0000 mL | Freq: Once | INTRAVENOUS | Status: AC
  Administered 2012-05-10: 2000 mL via INTRAVENOUS

## 2012-05-10 MED ORDER — SODIUM CHLORIDE 0.9 % IV SOLN
25.0000 ug/h | INTRAVENOUS | Status: DC
  Administered 2012-05-10: 50 ug/h via INTRAVENOUS
  Administered 2012-05-11: 100 ug/h via INTRAVENOUS
  Filled 2012-05-10 (×3): qty 50

## 2012-05-10 MED ORDER — ARTIFICIAL TEARS OP OINT
1.0000 "application " | TOPICAL_OINTMENT | Freq: Three times a day (TID) | OPHTHALMIC | Status: DC
  Administered 2012-05-10 – 2012-05-12 (×5): 1 via OPHTHALMIC
  Filled 2012-05-10 (×2): qty 3.5

## 2012-05-10 MED ORDER — FENTANYL CITRATE 0.05 MG/ML IJ SOLN: 100.0000 ug | Freq: Once | INTRAMUSCULAR | Status: DC | PRN

## 2012-05-10 MED ORDER — LIDOCAINE HCL (PF) 1 % IJ SOLN
INTRAMUSCULAR | Status: AC
  Filled 2012-05-10: qty 30

## 2012-05-10 MED ORDER — ETOMIDATE 2 MG/ML IV SOLN
INTRAVENOUS | Status: AC
  Filled 2012-05-10: qty 20

## 2012-05-10 MED ORDER — ROCURONIUM BROMIDE 50 MG/5ML IV SOLN
INTRAVENOUS | Status: AC
  Filled 2012-05-10: qty 2

## 2012-05-10 MED ORDER — ONDANSETRON HCL 4 MG/2ML IJ SOLN: 4.0000 mg | Freq: Four times a day (QID) | INTRAMUSCULAR | Status: DC | PRN

## 2012-05-10 MED ORDER — SODIUM CHLORIDE 0.9 % IV SOLN
1.0000 mg/h | INTRAVENOUS | Status: DC
  Administered 2012-05-10 – 2012-05-11 (×4): 2 mg/h via INTRAVENOUS
  Administered 2012-05-12: 4 mg/h via INTRAVENOUS
  Filled 2012-05-10 (×5): qty 10

## 2012-05-10 MED ORDER — FENTANYL BOLUS VIA INFUSION
50.0000 ug | INTRAVENOUS | Status: DC | PRN
  Administered 2012-05-10: 50 ug via INTRAVENOUS
  Filled 2012-05-10: qty 50

## 2012-05-10 MED ORDER — SODIUM CHLORIDE 0.9 % IV BOLUS (SEPSIS): 1000.0000 mL | Freq: Once | INTRAVENOUS | Status: DC

## 2012-05-10 MED ORDER — EPTIFIBATIDE 75 MG/100ML IV SOLN
2.0000 ug/kg/min | INTRAVENOUS | Status: DC
  Administered 2012-05-10 – 2012-05-11 (×2): 2 ug/kg/min via INTRAVENOUS
  Filled 2012-05-10 (×2): qty 100

## 2012-05-10 MED ORDER — SODIUM CHLORIDE 0.9 % IV SOLN
1.0000 ug/kg/min | INTRAVENOUS | Status: DC
  Administered 2012-05-10: 1 ug/kg/min via INTRAVENOUS
  Filled 2012-05-10 (×2): qty 20

## 2012-05-10 MED ORDER — MIDAZOLAM BOLUS VIA INFUSION
2.0000 mg | INTRAVENOUS | Status: DC | PRN
  Filled 2012-05-10: qty 2

## 2012-05-10 MED ORDER — SUCCINYLCHOLINE CHLORIDE 20 MG/ML IJ SOLN
75.0000 mg | Freq: Once | INTRAMUSCULAR | Status: AC
  Administered 2012-05-10: 75 mg via INTRAVENOUS

## 2012-05-10 MED ORDER — ETOMIDATE 2 MG/ML IV SOLN
25.0000 mg | Freq: Once | INTRAVENOUS | Status: AC
  Administered 2012-05-10: 25 mg via INTRAVENOUS

## 2012-05-10 MED ORDER — HEPARIN SODIUM (PORCINE) 5000 UNIT/ML IJ SOLN
5000.0000 [IU] | Freq: Three times a day (TID) | INTRAMUSCULAR | Status: DC
  Filled 2012-05-10 (×5): qty 1

## 2012-05-10 MED ORDER — TICAGRELOR 90 MG PO TABS
180.0000 mg | ORAL_TABLET | Freq: Once | ORAL | Status: AC
  Administered 2012-05-11: 180 mg via NASOGASTRIC
  Filled 2012-05-10: qty 2

## 2012-05-10 MED ORDER — EPTIFIBATIDE 75 MG/100ML IV SOLN
INTRAVENOUS | Status: AC
  Filled 2012-05-10: qty 100

## 2012-05-10 MED ORDER — HEPARIN (PORCINE) IN NACL 2-0.9 UNIT/ML-% IJ SOLN
INTRAMUSCULAR | Status: AC
  Filled 2012-05-10: qty 2000

## 2012-05-10 MED ORDER — TICAGRELOR 90 MG PO TABS
90.0000 mg | ORAL_TABLET | Freq: Two times a day (BID) | ORAL | Status: DC
  Administered 2012-05-11 – 2012-05-20 (×19): 90 mg via ORAL
  Filled 2012-05-10 (×20): qty 1

## 2012-05-10 MED ORDER — SERTRALINE HCL 50 MG PO TABS
50.0000 mg | ORAL_TABLET | Freq: Every day | ORAL | Status: DC
  Administered 2012-05-11: 50 mg via ORAL
  Filled 2012-05-10 (×2): qty 1

## 2012-05-10 MED ORDER — SUCCINYLCHOLINE CHLORIDE 20 MG/ML IJ SOLN
INTRAMUSCULAR | Status: AC
  Filled 2012-05-10: qty 1

## 2012-05-10 MED ORDER — LIDOCAINE HCL (CARDIAC) 20 MG/ML IV SOLN
INTRAVENOUS | Status: AC
  Filled 2012-05-10: qty 5

## 2012-05-10 NOTE — ED Notes (Signed)
Pt to department via EMS from home- pt was a witnessed arrest by family and compressions started. Pt was shocked 3 times with an AED. Pt given 2mg  of Narcan. Bp-90/60 Hr-120-a-fib. Pt reports he recently had some type of throat surgery recently. Pt with pulses on arrival.

## 2012-05-10 NOTE — ED Notes (Signed)
Cold Saline started at this time.

## 2012-05-10 NOTE — CV Procedure (Signed)
Cardiac Catheterization Operative Report  Louis Reid [id number] 12/3/20138:56 PM [name], MD  Procedure Performed:  1. Left Heart Catheterization 2. Selective Coronary Angiography 3. SVG graft angiography 4. Left ventricular angiogram 5. PTCA (balloon angioplasty) only proximal/mid LAD 6. Thrombectomy occluded LAD  Operator: Verne Carrow, MD  Indication: 78 yo male with history of CAD with 5V CABG 1996, last cath 2003 (severe restenosis proximal LAD stents treated with brachytherapy, atretic LIMA to LAD, patent SVG to Diagonal, occluded Circumflex with patent SVG to OM1/OM2, moderate disease proximal RCA with occluded SVG to RCA) admitted after out of hospital cardiac arrest. Reported v fib and shocked by AED. EKG showed atrial fib upon EMS arrival. Pt intubated via EMS. Urgent salvage cardiac cath.                                   Procedure Details: Emergency consent obtained. I spoke to his son before the case and reviewed the severity of the situation. The family concurred with the proposed plan, giving informed verbal consent. The patient was brought to the cath lab from the ED. The patient was sedated with Versed and Fentanyl and paralyzed on nimbex. The right groin was prepped and draped in the usual manner. Using the modified Seldinger access technique, a 6 French sheath was placed in the right femoral artery. Standard diagnostic catheters were used to perform selective coronary angiography. The JR4 catheter was used to engage both vein grafts. The vein graft to the RCA and the LIMA were known to be occluded and were not selectively engaged. The patient exhibited ST elevation upon arrival to the cath lab. His LAD was found to be totally occluded in the proximal stented segment. I elected to attempt to open the occluded LAD as this was felt to be the likely culprit for his cardiac arrest. He was given a bolus of Angiomax and a drip was started. He was given a  single bolus of Integrilin and a drip was started. I then engaged the left main with a XB LAD 3.5 guiding catheter. When the ACT was greater than 200, I passed a BMW wire down the LAD. I then made multiple inflations in the proximal stented segment with a 2.5 x 12 mm balloon and sluggish flow was re-established into the mid LAD. I then made three runs in the proximal and mid LAD with a Export thrombectomy catheter.  Flow was restored into the mid LAD with sluggish flow in the distal LAD likely from edema of the distal vascular bed as well as thrombus burden. There was a slight filling defect in the proximal stents so I inflated a 3.0 x 20 mm [facility] balloon x 2 in the proximal stented segment. At this point, there was excellent flow into the proximal and mid LAD but sluggish flow into the apical LAD. I did not think additional balloon angioplasty or thrombectomy would be beneficial. No stents were placed during this cath. The guide and catheter were removed.  A pigtail catheter was used to perform a left ventricular angiogram (hand injection).   There were no immediate complications. The patient was taken to the CCU in critical condition.   Hemodynamic Findings: Central aortic pressure: 103/67 Left ventricular pressure: 104/13/20  Angiographic Findings:  Left main: Mild plaque disease.  Left Anterior Descending Artery: Large caliber vessel with stents noted extending from the proximal vessel into the mid vessel. There is a  total occlusion of the proximal LAD within the most proximal segment of the stent. There is no flow into the mid and distal vessel. The diagonal branch fills from the patent vein graft. The LIMA to the mid LAD is known to be atretic by cath in 2003 and was not engaged.   Circumflex Artery: 99% proximal subtotal occlusion, unchanged from cath in 2003. The first and second obtuse marginal branches fill from the patent vein graft.   Right Coronary Artery: Large, dominant artery with 60%  ostial stenosis, 70% proximal stenosis and diffuse 50% disease in the mid vessel. The PDA is small in caliber. The posterolateral branch is moderate sized and has mild disease.   Graft Anatomy:  SVG sequential to OM1/OM2 is patent with mild irregularities in the body of the graft and a prominent valve mid body of graft but no obstructive lesions. SVG to diagonal is patent with mild irregularities in the mid body of the graft. SVG to RCA is known to be occluded and was not engaged. LIMA to mid LAD is known to be atretic and was not engaged.  Left Ventricular Angiogram: LVEF=20% with akinesis of the anterior wall and apex.   Impression: 1. 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 2. Salvage PCI with balloon angioplasty and thrombectomy of the occluded LAD 3. Acute anterior STEMI secondary to occluded LAD 4. Cardiac arrest, out of hospital.  5. Severe LV systolic dysfunction  Recommendations: He will be admitted to the CCU under the care of the PCCM service. Cooling protocol. Will run Integrilin for 18 hours. Will load with Brilinta 180 mg tonight then will give 90 mg per NG BID. ASA/statin/beta blocker as tolerated. Poor prognosis. Discussed case at length with his son.        Complications:  None. The patient tolerated the procedure well.

## 2012-05-10 NOTE — Progress Notes (Signed)
05/10/12 1900  Clinical Encounter Type  Visited With Family  Visit Type Trauma   I spent time and prayed with the patient's son while his father was in ED and then in the cath lab. Family's pastor came in to be with the family. Follow up with patient/family. Veryl Speak

## 2012-05-10 NOTE — Procedures (Signed)
Name:  [name] MRN:  [id number] DOB:  [birth year]  PROCEDURE NOTE  Procedure:  Central venous catheter placement.  Indications:  Need for intravenous access and hemodynamic monitoring.  Consent:  Consent was implied due to the emergency nature of the procedure.  Anesthesia:  A total of 10 mL of 1% Lidocaine was used for local infiltration anesthesia.  Procedure summary:  Appropriate equipment was assembled.  The patient was identified as Louis Reid and safety timeout was performed. The patient was placed in Trendelenburg position.  Sterile technique was used. The patient's left anterior chest wall was prepped using chlorhexidine / alcohol scrub and the field was draped in usual sterile fashion with full body drape. After the adequate anesthesia was achieved, the left subclavian vein was cannulated with the introducer needle without difficulty. A guide wire was advanced through the introducer needle, which was then withdrawn. A small skin incision was made at the point of wire entry, the dilator was inserted over the guide wire and appropriate dilation was obtained. The dilator was removed and triple-lumen catheter was advanced over the guide wire, which was then removed.  All ports were aspirated and flushed with normal saline without difficulty. The catheter was secured into place. Antibiotic patch was placed and sterile dressing was applied. Post-procedure chest x-ray was ordered.  Complications:  No immediate complications were noted.  Hemodynamic parameters and oxygenation remained stable throughout the procedure.  Estimated blood loss:  Less then 5 mL.  Orlean Bradford, M.D. Pulmonary and Critical Care Medicine [facility] Cell: [phone]  05/10/2012, 7:42 PM

## 2012-05-10 NOTE — ED Notes (Signed)
Istat G4 lactic acid results handed to Dr.Sheldon at 1854

## 2012-05-10 NOTE — ED Notes (Signed)
PCCm at the bedside to placed central line

## 2012-05-10 NOTE — ED Notes (Signed)
Family placed in consultation room C

## 2012-05-10 NOTE — ED Provider Notes (Signed)
I saw and evaluated the patient, reviewed the resident's note and I agree with the findings and plan.  Agree with EKG interpretation if present.   Pt with recent admission for SBO s/p ex-lap, small bowel resection had witnessed arrest at home prior to arrival, had about 10 min of bystander CPR and shock x 3 from AED by first responder. Paramedics found him in afib with pulses and spontaneous respirations but unresponsive. King airway unsuccessful in the field.   Intubated on arrival, I was at bedside for the procedure. Post-arrest cooling protocol initiated, PCCM at bedside and [facility] to take patient to the Cath Lab.   CRITICAL CARE Performed by: Pollyann Savoy   Total critical care time: 45  Critical care time was exclusive of separately billable procedures and treating other patients.  Critical care was necessary to treat or prevent imminent or life-threatening deterioration.  Critical care was time spent personally by me on the following activities: development of treatment plan with patient and/or surrogate as well as nursing, discussions with consultants, evaluation of patient's response to treatment, examination of patient, obtaining history from patient or surrogate, ordering and performing treatments and interventions, ordering and review of laboratory studies, ordering and review of radiographic studies, pulse oximetry and re-evaluation of patient's condition.   Charles B. Bernette Mayers, MD 05/10/12 1924

## 2012-05-10 NOTE — H&P (Signed)
PULMONARY  / CRITICAL CARE MEDICINE  Name: Louis Reid MRN: [id number] DOB: May 14, 1933    LOS: 0  REFERRING MD:  PMD  CHIEF COMPLAINT:  Cardiac arrest  BRIEF PATIENT DESCRIPTION:  78 yo with CAD, congenital pectus excavatum and recent admission for SBO / lysis of adhesions brought to [facility] ED after suffering VF cardiac arrest and ROSC after brief CPR and shocks x 3.  Hypothermia protocol started.  Awaiting cath lab.  LINES / TUBES: 12/3  OETT >>> 12/3  OGT >>> 12/3  Foley >>> 12/3  L Snyder CVL >>>  CULTURES: NA  ANTIBIOTICS: NA  SIGNIFICANT EVENTS:  12/3  Bought to [facility] ED after suffering VF cardiac arrest and ROSC after brief CPR and shocks x 3.  Hypothermia protocol started.  Awaiting cath lab.  LEVEL OF CARE:  ICU  PRIMARY SERVICE:  PCCM  CONSULTANTS:  Cardiology  CODE STATUS:  Full  DIET:  NPO  DVT Px:  Heparin   GI Px:  Protonix  The patient is encephalopathic and unable to provide history, which was obtained for available medical records.  HISTORY OF PRESENT ILLNESS:  78 yo with congenital pectus excavatum and recent admission for SBO / lysis of adhesions brought to [facility] ED after suffering VF cardiac arrest and ROSC after brief CPR and shocks x 3.  Hypothermia protocol started.  Awaiting cath lab.  PAST MEDICAL HISTORY :  Past Medical History  Diagnosis Date  . Acute myocardial infarction of anterolateral wall, episode of care unspecified   . Coronary atherosclerosis of artery bypass graft   . Sinoatrial node dysfunction   . HTN (hypertension)   . HLD (hyperlipidemia)   . Depression   . History of kidney stones   . Congenital funnel chest   . Inguinal hernia   . Osteoporosis    Past Surgical History  Procedure Date  . Coronary artery bypass graft 1996  . Inguinal hernia repair 1996    right, hx  . Transurethral resection of prostate   . Ptca     percutaneous transluminal coronary intervention and brachy therapy, Bruce R. Juanda Chance, MD. EF 60%  .  Laparotomy 04/27/2012    Procedure: EXPLORATORY LAPAROTOMY;  Surgeon: Cherylynn Ridges, MD;  Location: [facility] OR;  Service: General;  Laterality: N/A;  . Esophagogastroduodenoscopy 05/08/2012    Procedure: ESOPHAGOGASTRODUODENOSCOPY (EGD);  Surgeon: Charna Elizabeth, MD;  Location: [facility] ENDOSCOPY;  Service: Endoscopy;  Laterality: N/A;  Gunnar Fusi put on for Dr. Loreta Ave , Dr. Loreta Ave will call if she wants another time   Prior to Admission medications   Medication Sig Start Date End Date Taking? Authorizing Provider  aspirin 81 MG tablet Take 1 tablet (81 mg total) by mouth daily. 07/09/11   Gaylord Shih, MD  benazepril (LOTENSIN) 10 MG tablet Take 1 tablet (10 mg total) by mouth daily. 11/24/11   Gaylord Shih, MD  docusate sodium (COLACE) 100 MG capsule Take 100 mg by mouth daily as needed. constipation    Historical Provider, MD  HYDROcodone-acetaminophen (NORCO) 5-325 MG per tablet Take 1-2 tablets by mouth every 4 (four) hours as needed for pain. 05/04/12   Letha Cape, PA  Multiple Vitamin (MULTIVITAMIN WITH MINERALS) TABS Take 1 tablet by mouth daily.    Historical Provider, MD  pantoprazole (PROTONIX) 40 MG tablet Take 1 tablet (40 mg total) by mouth 2 (two) times daily before a meal. 05/09/12   Letha Cape, PA  sertraline (ZOLOFT) 50 MG tablet Take 50 mg  by mouth daily.    Historical Provider, MD  simvastatin (ZOCOR) 20 MG tablet TAKE 1 TABLET BY MOUTH AT BEDTIME 09/18/11   Gaylord Shih, MD  sucralfate (CARAFATE) 1 GM/10ML suspension Take 10 mLs (1 g total) by mouth 4 (four) times daily -  with meals and at bedtime. 05/09/12   Letha Cape, PA   Allergies  Allergen Reactions  . Cold Medicine Plus (Chlorphen-Pseudoephed-Apap)     COLD MEDICATIONS MAKE HIM "LOOPY"    FAMILY HISTORY:  Family History  Problem Relation Age of Onset  . Colon cancer Brother   . Diabetes Brother   . Heart disease Father   . Heart disease Brother   . Other Mother     brain tumor   SOCIAL HISTORY:  reports that he has  quit smoking. He has never used smokeless tobacco. He reports that he does not drink alcohol or use illicit drugs.  REVIEW OF SYSTEMS:  Unable to provide.  INTERVAL HISTORY:  VITAL SIGNS: Temp:  [91.4 F (33 C)-97.2 F (36.2 C)] 97.2 F (36.2 C) (12/03 1900) Pulse Rate:  [130-138] 131  (12/03 1900) Resp:  [16-33] 22  (12/03 1900) BP: (87-108)/(56-78) 87/58 mmHg (12/03 1900) SpO2:  [96 %-100 %] 98 % (12/03 1900) FiO2 (%):  [100 %] 100 % (12/03 1847) Weight:  [59 kg (130 lb 1.1 oz)] 59 kg (130 lb 1.1 oz) (12/03 1847)  HEMODYNAMICS:    VENTILATOR SETTINGS: Vent Mode:  [-] PRVC FiO2 (%):  [100 %] 100 % Set Rate:  [18 bmp] 18 bmp Vt Set:  [420 mL] 420 mL PEEP:  [5 cmH20] 5 cmH20 INTAKE / OUTPUT: Intake/Output    None    PHYSICAL EXAMINATION: General:  Appears acutely ill, mechanically ventilated, synchronous Neuro:  Encephalopathic, nonfocal, cough / gag diminished HEENT:  PERRL, OETT / OGT Cardiovascular:  RRR, no m/r/g, L  CVL Lungs:  Bilateral diminished air entry, no w/r/r, pectus excavatum Abdomen:  Soft, nontender, surgical incision C/D/I Musculoskeletal:  Moves all extremities, no edema Skin:  Intact  LABS:  Lab 05/10/12 1851 05/10/12 1835 05/07/12 0640  HGB -- 10.9* 11.2*  WBC -- 13.1* 7.0  PLT -- 320 296  NA -- -- 138  K -- -- 4.0  CL -- -- 104  CO2 -- -- 28  GLUCOSE -- -- 101*  BUN -- -- 14  CREATININE -- -- 0.87  CALCIUM -- -- 8.4  MG -- -- --  PHOS -- -- --  AST -- -- --  ALT -- -- --  ALKPHOS -- -- --  BILITOT -- -- --  PROT -- -- --  ALBUMIN -- -- --  APTT -- 31 --  INR -- 1.29 --  LATICACIDVEN 5.64* -- --  TROPONINI -- -- --  PROCALCITON -- -- --  PROBNP -- -- --  O2SATVEN -- -- --  PHART -- -- --  PCO2ART -- -- --  PO2ART -- -- --    Lab 05/10/12 1830  GLUCAP 180*   IMAGING:  Pending  ECG:  Sinus tachycardia on monitor  DIAGNOSES: Active Problems:  HYPERLIPIDEMIA  HYPERTENSION  Asymptomatic old MI (myocardial  infarction)  Coronary artery disease  History of coronary artery bypass surgery  Ventricular fibrillation  Cardiac arrest  Acute respiratory failure  Acute encephalopathy  Hyperglycemia  ASSESSMENT / PLAN:  PULMONARY  A:  Acute respiratory failure.  Congenital pectus excavatum. P:   Gaol SpO2>92, pH>7.30 Full mechanical support Hold SBT while on Nimbex Trend  ABG / CXR  CARDIOVASCULAR  A: VF cardiac arrest. CAD. Dyslipidemia. P:  Goal MAP>85 Levophed gtt PRN ASA Zocor Cath lab tonight  RENAL  A:  No active issues. P:   Goal CVP>10 Trend BMP Fluids per Hypothermia protocol  GASTROINTESTINAL  A:  Recent SBO / lysis of adhesions. P:   NPO as intubated TF if remains intubated > 24 hours  HEMATOLOGIC  A:  Anemia. P:  Trend CBC  INFECTIOUS  A:  No evidence of active infection. P:   No intervention required  ENDOCRINE   A:  Hyperglycemia. P:   ICU Glycemic Control Protocol Phase 1  NEUROLOGIC  A:  Acute encephalopathy.  Possible anoxia. P:   Goal RASS 0 to -1 Fentanyl / Versed / Nimbex Hold WUA while on Nimbex Hypothermia protocol  CLINICAL SUMMARY:  78 yo with congenital pectus excavatum and recent admission for SBO / lysis of adhesions brought to [facility] ED after suffering VF cardiac arrest and ROSC after brief CPR and shocks x 3.  Hypothermia protocol started.  Awaiting cath lab.  I have personally obtained a history, examined the patient, evaluated laboratory and imaging results, formulated the assessment and plan and placed orders.  CRITICAL CARE:  The patient is critically ill with multiple organ systems failure and requires high complexity decision making for assessment and support, frequent evaluation and titration of therapies, application of advanced monitoring technologies and extensive interpretation of multiple databases. Critical Care Time devoted to patient care services described in this note is 45 minutes.   Lonia Farber,  MD  Pulmonary and Critical Care Medicine [facility] Pager: [phone]  05/10/2012, 7:27 PM

## 2012-05-10 NOTE — ED Notes (Signed)
Airway placed without any difficulty.

## 2012-05-10 NOTE — Consult Note (Signed)
Patient ID: Louis Reid MRN: [id number] DOB/AGE: [birth year] 78 y.o.  Admit date: 05/10/2012 Referring Physician:  Primary Physician:  Primary Cardiologist:  Reason for Consultation:   HPI: 78 yo male with history of CAD s/p 4V CABG in 1996, HTN, HLD and recent small bowel obstruction discharged yesterday after laparoscopic small bowel reduction who is now admitted after a witnessed cardiac arrest this afternoon. Bystander CPR and AED shock. When EMS arrived, he was in atrial fibrllation with pulses and spontaneous respirations but not responding so he was intubated. He has been managed in the ED by PCCM and is being placed on the [employer] [employer] cooling protocol. Urgent salvage cardiac cath has been requested by the critical care team. Of note, this patients last cath was in 2003 and he had a 99% in stent restenosis in the proximal LAD, 90% restenosis in mid LAD stent with occluded LIMA to LAD, occluded proximal Circumflex with SVG to OM1/OM2, patent SVG to diagonal, 60% ostial RCA stenosis with occluded SVG to RCA. Per notes, his LAD was treated with brachytherapy per Dr. Juanda Chance. No repeat cath since 2003.   Pt intubated upon arrival to cath lab and unable to give a history.  No ROS obtainable secondary to above.    Past Medical History  Diagnosis Date  . Acute myocardial infarction of anterolateral wall, episode of care unspecified   . Coronary atherosclerosis of artery bypass graft   . Sinoatrial node dysfunction   . HTN (hypertension)   . HLD (hyperlipidemia)   . Depression   . History of kidney stones   . Congenital funnel chest   . Inguinal hernia   . Osteoporosis     Family History  Problem Relation Age of Onset  . Colon cancer Brother   . Diabetes Brother   . Heart disease Father   . Heart disease Brother   . Other Mother     brain tumor    History   Social History  . Marital Status: Widowed    Spouse Name: N/A    Number of Children: 1  . Years of Education: N/A    Occupational History  . [name]    Social History Main Topics  . Smoking status: Former Games developer  . Smokeless tobacco: Never Used     Comment: quit in the 1960's  . Alcohol Use: No  . Drug Use: No  . Sexually Active: Not on file   Other Topics Concern  . Not on file   Social History Narrative  . No narrative on file    Past Surgical History  Procedure Date  . Coronary artery bypass graft 1996  . Inguinal hernia repair 1996    right, hx  . Transurethral resection of prostate   . Ptca     percutaneous transluminal coronary intervention and brachy therapy, Bruce R. Juanda Chance, MD. EF 60%  . Laparotomy 04/27/2012    Procedure: EXPLORATORY LAPAROTOMY;  Surgeon: Cherylynn Ridges, MD;  Location: [facility] OR;  Service: General;  Laterality: N/A;  . Esophagogastroduodenoscopy 05/08/2012    Procedure: ESOPHAGOGASTRODUODENOSCOPY (EGD);  Surgeon: Charna Elizabeth, MD;  Location: [facility] ENDOSCOPY;  Service: Endoscopy;  Laterality: N/A;  Gunnar Fusi put on for Dr. Loreta Ave , Dr. Loreta Ave will call if she wants another time    Allergies  Allergen Reactions  . Cold Medicine Plus (Chlorphen-Pseudoephed-Apap)     COLD MEDICATIONS MAKE HIM "LOOPY"       Physical Exam: Blood pressure 87/58, pulse 131, temperature 97.2 F (  36.2 C), resp. rate 22, height 5\' 9"  (1.753 m), weight 130 lb 1.1 oz (59 kg), SpO2 98.00%.    General: Intubated, sedated. HEENT: OP clear, mucus membranes moist  SKIN: warm, dry. No rashes.  Neuro: Sedated. Musculoskeletal: Muscle tone is good. Psychiatric: Sedated. Neck: no thyromegaly, no lymphadenopathy.  Lungs:Coarse BS bilaterally, no wheezes, rhonci, crackles  Cardiovascular: Tachy, irregular.   Abdomen:Soft. Bowel sounds present. Non-tender.  Extremities: No lower extremity edema.   Labs:   Lab Results  Component Value Date   WBC 13.1* 05/10/2012   HGB 10.9* 05/10/2012   HCT 33.7* 05/10/2012   MCV 96.3 05/10/2012   PLT 320 05/10/2012    Lab 05/07/12 0640  NA 138  K 4.0   CL 104  CO2 28  BUN 14  CREATININE 0.87  CALCIUM 8.4  PROT --  BILITOT --  ALKPHOS --  ALT --  AST --  GLUCOSE 101*   No results found for this basename: CKTOTAL, CKMB, CKMBINDEX, TROPONINI    Cardiac cath 09/19/2001:  CORONARIES:  1. LEFT MAIN: Normal.  2. LEFT ANTERIOR DESCENDING ARTERY: Had proximal and mid stents. There was  a 99% proximal stenosis in the first stent. There was a 90% long mid  LAD stenosis in the distal segment of the second stent. There was 50%  apical stenosis. A first diagonal branch was moderate sized and was  subtotally occluded, seemed to fill via a vein graft.  3. CIRCUMFLEX CORONARY ARTERY: Occluded at the ostium. There was a medium  sized first marginal and a larger second marginal perfused by sequential  vein graft.  4. RIGHT CORONARY ARTERY: Dominant. It had 50-60% ostial stenosis, followed  by 50% proximal stenosis, followed by 30% mid stenosis.  5. GRAFTS:  a. LIMA to the LAD was not injected. It had previously been demonstrated  to be atretic.  b. Saphenous vein graft to the right coronary artery was not injected. It  was occluded on a previous catheterization.  c. A saphenous vein graft to the diagonal was patent, with luminal  irregularities in the mid and distal segment of the graft.  d. A saphenous vein graft sequential to a first and second marginal had  a 40% stenosis before the first anastomosis. There was streaming flow  there. The appearance of this was unchanged from the catheterization in  October 2002.      EKG: Atrial fibrillation, rate 118 bpm. Non-specific ST changes. No clear ST segment elevation.   ASSESSMENT AND PLAN:   1. Post cardiac arrest 2. History of severe CAD s/p CABG with 3 patent bypass grafts at last cath 2003  Urgent salvage cardiac cath tonight. Further plans to follow.    Signed: MCALHANY,CHRISTOPHER 05/10/2012, 7:32 PM

## 2012-05-11 ENCOUNTER — Inpatient Hospital Stay (HOSPITAL_COMMUNITY): Payer: Medicare Other

## 2012-05-11 LAB — POCT I-STAT, CHEM 8
BUN: 17 mg/dL (ref 6–23)
Chloride: 113 mEq/L — ABNORMAL HIGH (ref 96–112)
Creatinine, Ser: 0.7 mg/dL (ref 0.50–1.35)
Creatinine, Ser: 0.7 mg/dL (ref 0.50–1.35)
Glucose, Bld: 143 mg/dL — ABNORMAL HIGH (ref 70–99)
Glucose, Bld: 152 mg/dL — ABNORMAL HIGH (ref 70–99)
HCT: 36 % — ABNORMAL LOW (ref 39.0–52.0)
Hemoglobin: 11.9 g/dL — ABNORMAL LOW (ref 13.0–17.0)
Hemoglobin: 12.2 g/dL — ABNORMAL LOW (ref 13.0–17.0)
Hemoglobin: 12.2 g/dL — ABNORMAL LOW (ref 13.0–17.0)
Potassium: 4.6 mEq/L (ref 3.5–5.1)
Sodium: 145 mEq/L (ref 135–145)
Sodium: 144 mEq/L (ref 135–145)
Sodium: 144 mEq/L (ref 135–145)
TCO2: 18 mmol/L (ref 0–100)
TCO2: 18 mmol/L (ref 0–100)
TCO2: 17 mmol/L (ref 0–100)

## 2012-05-11 LAB — POCT I-STAT 3, ART BLOOD GAS (G3+)
Acid-base deficit: 5 mmol/L — ABNORMAL HIGH (ref 0.0–2.0)
Acid-base deficit: 7 mmol/L — ABNORMAL HIGH (ref 0.0–2.0)
Bicarbonate: 19.4 mEq/L — ABNORMAL LOW (ref 20.0–24.0)
Bicarbonate: 18.5 mEq/L — ABNORMAL LOW (ref 20.0–24.0)
Patient temperature: 91.4
Patient temperature: 91.2
TCO2: 20 mmol/L (ref 0–100)
TCO2: 20 mmol/L (ref 0–100)
pH, Arterial: 7.365 (ref 7.350–7.450)
pO2, Arterial: 139 mmHg — ABNORMAL HIGH (ref 80.0–100.0)
pO2, Arterial: 135 mmHg — ABNORMAL HIGH (ref 80.0–100.0)

## 2012-05-11 LAB — POCT ACTIVATED CLOTTING TIME
Activated Clotting Time: 165 seconds
Activated Clotting Time: 154 seconds
Activated Clotting Time: 144 seconds
Activated Clotting Time: 154 seconds

## 2012-05-11 LAB — GLUCOSE, CAPILLARY
Glucose-Capillary: 139 mg/dL — ABNORMAL HIGH (ref 70–99)
Glucose-Capillary: 171 mg/dL — ABNORMAL HIGH (ref 70–99)
Glucose-Capillary: 181 mg/dL — ABNORMAL HIGH (ref 70–99)
Glucose-Capillary: 160 mg/dL — ABNORMAL HIGH (ref 70–99)
Glucose-Capillary: 164 mg/dL — ABNORMAL HIGH (ref 70–99)
Glucose-Capillary: 153 mg/dL — ABNORMAL HIGH (ref 70–99)
Glucose-Capillary: 147 mg/dL — ABNORMAL HIGH (ref 70–99)
Glucose-Capillary: 118 mg/dL — ABNORMAL HIGH (ref 70–99)
Glucose-Capillary: 123 mg/dL — ABNORMAL HIGH (ref 70–99)
Glucose-Capillary: 121 mg/dL — ABNORMAL HIGH (ref 70–99)

## 2012-05-11 LAB — BLOOD GAS, ARTERIAL
Acid-base deficit: 7.9 mmol/L — ABNORMAL HIGH (ref 0.0–2.0)
Drawn by: 331761
Drawn by: 331761
FIO2: 0.6 %
O2 Saturation: 99.9 %
PEEP: 5 cmH2O
Patient temperature: 91.4
RATE: 14 resp/min
RATE: 14 resp/min
pCO2 arterial: 23.2 mmHg — ABNORMAL LOW (ref 35.0–45.0)
pH, Arterial: 7.437 (ref 7.350–7.450)
pO2, Arterial: 276 mmHg — ABNORMAL HIGH (ref 80.0–100.0)

## 2012-05-11 LAB — CBC
HCT: 33.7 % — ABNORMAL LOW (ref 39.0–52.0)
HCT: 35.4 % — ABNORMAL LOW (ref 39.0–52.0)
Hemoglobin: 11.2 g/dL — ABNORMAL LOW (ref 13.0–17.0)
MCH: 31.5 pg (ref 26.0–34.0)
MCH: 31.6 pg (ref 26.0–34.0)
MCHC: 33.2 g/dL (ref 30.0–36.0)
MCHC: 33.6 g/dL (ref 30.0–36.0)
MCV: 93.9 fL (ref 78.0–100.0)
MCV: 94.7 fL (ref 78.0–100.0)
Platelets: 362 10*3/uL (ref 150–400)
RDW: 13.7 % (ref 11.5–15.5)
WBC: 28.6 10*3/uL — ABNORMAL HIGH (ref 4.0–10.5)

## 2012-05-11 LAB — BASIC METABOLIC PANEL
BUN: 15 mg/dL (ref 6–23)
BUN: 16 mg/dL (ref 6–23)
CO2: 15 mEq/L — ABNORMAL LOW (ref 19–32)
CO2: 17 mEq/L — ABNORMAL LOW (ref 19–32)
Calcium: 7.1 mg/dL — ABNORMAL LOW (ref 8.4–10.5)
Calcium: 7.1 mg/dL — ABNORMAL LOW (ref 8.4–10.5)
Calcium: 7.3 mg/dL — ABNORMAL LOW (ref 8.4–10.5)
Creatinine, Ser: 0.66 mg/dL (ref 0.50–1.35)
Creatinine, Ser: 0.69 mg/dL (ref 0.50–1.35)
GFR calc Af Amer: >90 mL/min (ref >90)
GFR calc Af Amer: >90 mL/min (ref >90)
GFR calc Af Amer: >90 mL/min (ref >90)
GFR calc non Af Amer: 89 mL/min — ABNORMAL LOW (ref >90)
GFR calc non Af Amer: 89 mL/min — ABNORMAL LOW (ref >90)
GFR calc non Af Amer: >90 mL/min (ref >90)
Glucose, Bld: 183 mg/dL — ABNORMAL HIGH (ref 70–99)
Glucose, Bld: 191 mg/dL — ABNORMAL HIGH (ref 70–99)
Potassium: 3.2 mEq/L — ABNORMAL LOW (ref 3.5–5.1)
Potassium: 3.7 mEq/L (ref 3.5–5.1)
Sodium: 138 mEq/L (ref 135–145)
Sodium: 141 mEq/L (ref 135–145)

## 2012-05-11 LAB — LACTIC ACID, PLASMA
Lactic Acid, Venous: 1.5 mmol/L (ref 0.5–2.2)
Lactic Acid, Venous: 1.6 mmol/L (ref 0.5–2.2)

## 2012-05-11 LAB — TROPONIN I: Troponin I: >20 ng/mL (ref <0.30)

## 2012-05-11 LAB — TSH: TSH: 9.927 u[IU]/mL — ABNORMAL HIGH (ref 0.350–4.500)

## 2012-05-11 LAB — PROTIME-INR: Prothrombin Time: 17.5 seconds — ABNORMAL HIGH (ref 11.6–15.2)

## 2012-05-11 LAB — APTT: aPTT: 55 seconds — ABNORMAL HIGH (ref 24–37)

## 2012-05-11 MED ORDER — ATROPINE SULFATE 1 MG/ML IJ SOLN
INTRAMUSCULAR | Status: AC
  Filled 2012-05-11: qty 1

## 2012-05-11 MED ORDER — SODIUM CHLORIDE 0.9 % IV SOLN
Freq: Once | INTRAVENOUS | Status: AC
  Administered 2012-05-11: 500 mL via INTRAVENOUS

## 2012-05-11 MED ORDER — SODIUM PHOSPHATE 3 MMOLE/ML IV SOLN: 30.0000 mmol | Freq: Once | INTRAVENOUS | Status: DC

## 2012-05-11 MED ORDER — VANCOMYCIN HCL 500 MG IV SOLR
500.0000 mg | Freq: Two times a day (BID) | INTRAVENOUS | Status: DC
  Administered 2012-05-11 – 2012-05-12 (×3): 500 mg via INTRAVENOUS
  Filled 2012-05-11 (×4): qty 500

## 2012-05-11 MED ORDER — POTASSIUM CHLORIDE 10 MEQ/50ML IV SOLN
INTRAVENOUS | Status: AC
  Filled 2012-05-11: qty 50

## 2012-05-11 MED ORDER — SODIUM CHLORIDE 0.9 % IV BOLUS (SEPSIS)
500.0000 mL | Freq: Once | INTRAVENOUS | Status: AC
  Administered 2012-05-11: 500 mL via INTRAVENOUS

## 2012-05-11 MED ORDER — SODIUM CHLORIDE 0.9 % IV BOLUS (SEPSIS)
1000.0000 mL | Freq: Once | INTRAVENOUS | Status: AC
  Administered 2012-05-11: 1000 mL via INTRAVENOUS

## 2012-05-11 MED ORDER — NOREPINEPHRINE BITARTRATE 1 MG/ML IJ SOLN
2.0000 ug/min | INTRAVENOUS | Status: DC
  Administered 2012-05-11: 12 ug/min via INTRAVENOUS
  Filled 2012-05-11: qty 4

## 2012-05-11 MED ORDER — POTASSIUM CHLORIDE 10 MEQ/50ML IV SOLN
10.0000 meq | INTRAVENOUS | Status: AC
  Administered 2012-05-11 (×4): 10 meq via INTRAVENOUS
  Filled 2012-05-11 (×3): qty 50

## 2012-05-11 MED ORDER — SODIUM CHLORIDE 0.9 % IV SOLN
0.0300 [IU]/min | INTRAVENOUS | Status: DC
  Administered 2012-05-11 – 2012-05-12 (×2): 0.03 [IU]/min via INTRAVENOUS
  Filled 2012-05-11 (×2): qty 2.5

## 2012-05-11 MED ORDER — SODIUM CHLORIDE 0.9 % IV SOLN
INTRAVENOUS | Status: DC
  Administered 2012-05-10: 10 mL/h via INTRAVENOUS

## 2012-05-11 MED ORDER — SODIUM GLYCEROPHOSPHATE 1 MMOLE/ML IV SOLN
30.0000 mmol | Freq: Once | INTRAVENOUS | Status: AC
  Administered 2012-05-11: 30 mmol via INTRAVENOUS
  Filled 2012-05-11: qty 30

## 2012-05-11 MED ORDER — SODIUM CHLORIDE 0.9 % IV SOLN
Freq: Once | INTRAVENOUS | Status: AC
  Administered 2012-05-11: 15:00:00 via INTRAVENOUS

## 2012-05-11 MED ORDER — NOREPINEPHRINE BITARTRATE 1 MG/ML IJ SOLN
2.0000 ug/min | INTRAVENOUS | Status: DC
  Administered 2012-05-11: 14 ug/min via INTRAVENOUS
  Administered 2012-05-12: 30 ug/min via INTRAVENOUS
  Administered 2012-05-12: 48 ug/min via INTRAVENOUS
  Administered 2012-05-12: 44 ug/min via INTRAVENOUS
  Administered 2012-05-12 (×2): 46 ug/min via INTRAVENOUS
  Administered 2012-05-12 (×2): 25 ug/min via INTRAVENOUS
  Administered 2012-05-13: 10 ug/min via INTRAVENOUS
  Filled 2012-05-11 (×5): qty 16

## 2012-05-11 MED ORDER — SODIUM CHLORIDE 0.9 % IV SOLN
INTRAVENOUS | Status: DC
  Administered 2012-05-11: 10 mL/h via INTRAVENOUS
  Administered 2012-05-18: 22:00:00 via INTRAVENOUS

## 2012-05-11 MED ORDER — SODIUM GLYCEROPHOSPHATE  1 MMOL/ML IV SOLN: 30.0000 mmol | Freq: Once | INTRAVENOUS | Status: DC

## 2012-05-11 MED ORDER — POTASSIUM CHLORIDE 10 MEQ/50ML IV SOLN
10.0000 meq | INTRAVENOUS | Status: AC
  Administered 2012-05-11 (×3): 10 meq via INTRAVENOUS

## 2012-05-11 MED ORDER — POTASSIUM CHLORIDE 10 MEQ/50ML IV SOLN
INTRAVENOUS | Status: AC
  Administered 2012-05-11: 10 meq
  Filled 2012-05-11: qty 200

## 2012-05-11 MED ORDER — PIPERACILLIN-TAZOBACTAM 3.375 G IVPB
3.3750 g | Freq: Three times a day (TID) | INTRAVENOUS | Status: DC
  Administered 2012-05-11 – 2012-05-16 (×16): 3.375 g via INTRAVENOUS
  Filled 2012-05-11 (×17): qty 50

## 2012-05-11 MED ORDER — ASPIRIN 81 MG PO CHEW
81.0000 mg | CHEWABLE_TABLET | Freq: Every day | ORAL | Status: DC
  Administered 2012-05-12 – 2012-05-20 (×9): 81 mg via ORAL
  Filled 2012-05-11 (×9): qty 1

## 2012-05-11 MED FILL — Dextrose Inj 5%: INTRAVENOUS | Qty: 50 | Status: AC

## 2012-05-11 NOTE — Progress Notes (Signed)
eLink Physician-Brief Progress Note Patient Name: TAJE LITTLER DOB: 1932/06/09 MRN: [id number]  Date of Service  05/11/2012   HPI/Events of Note  OGT to low intermittent suction Hypokalemia - potassium replaced   eICU Interventions     Intervention Category Minor Interventions: Routine modifications to care plan (e.g. PRN medications for pain, fever);Electrolytes abnormality - evaluation and management  Adithi Gammon 05/11/2012, 12:19 AM

## 2012-05-11 NOTE — Progress Notes (Signed)
[facility] aware of critical high troponin.  Louis Reid, Louis Reid

## 2012-05-11 NOTE — Progress Notes (Signed)
Dr. Tyson Alias notified of pts B/P status, bloody drainage from mouth. Orders received

## 2012-05-11 NOTE — Progress Notes (Signed)
Sheath in right groin level 0 at time of assessment prior to sheath removal. Sheath removed at 0347. Manual pressure held until 0409. Pt asymptomatic during removal. Site level 0 and pressure dressing applied. Pulses +2 in right femoral artery and doppler in right dorsalis pedis. Unable to educate pt due to sedation.

## 2012-05-11 NOTE — Progress Notes (Signed)
INITIAL ADULT NUTRITION ASSESSMENT Date: 05/11/2012   Time: 12:06 PM Reason for Assessment: Vent   INTERVENTION: 1. Recommend early initiation of EN r/t pt underweight, hx of recent weight loss and NPOx8 days at last admission.  Recommend: Initiate Promote at 20 ml/hr and advance by 10 ml q 4 hr to a goal rate of 45 ml/hr. 30 ml Pro-stat BID via tube. This EN regimen will provide 1280 kcal, 98 gm protein, and 908 ml free water.   2. RD will continue to follow    DOCUMENTATION CODES Per approved criteria  -Severe malnutrition in the context of acute illness or injury -Underweight     ASSESSMENT: Male 78 y.o.  Dx: Cardiac arrest, VDRF, hypothermia protocol   Hx:  Past Medical History  Diagnosis Date  . Acute myocardial infarction of anterolateral wall, episode of care unspecified   . Coronary atherosclerosis of artery bypass graft   . Sinoatrial node dysfunction   . HTN (hypertension)   . HLD (hyperlipidemia)   . Depression   . History of kidney stones   . Congenital funnel chest   . Inguinal hernia   . Osteoporosis     Past Surgical History  Procedure Date  . Coronary artery bypass graft 1996  . Inguinal hernia repair 1996    right, hx  . Transurethral resection of prostate   . Ptca     percutaneous transluminal coronary intervention and brachy therapy, Bruce R. Juanda Chance, MD. EF 60%  . Laparotomy 04/27/2012    Procedure: EXPLORATORY LAPAROTOMY;  Surgeon: Cherylynn Ridges, MD;  Location: [facility] OR;  Service: General;  Laterality: N/A;  . Esophagogastroduodenoscopy 05/08/2012    Procedure: ESOPHAGOGASTRODUODENOSCOPY (EGD);  Surgeon: Charna Elizabeth, MD;  Location: [facility] ENDOSCOPY;  Service: Endoscopy;  Laterality: N/A;  Gunnar Fusi put on for Dr. Loreta Ave , Dr. Loreta Ave will call if she wants another time    Related Meds:     . [COMPLETED] sodium chloride  2,000 mL Intravenous Once  . antiseptic oral rinse  1 application Mouth Rinse QID  . artificial tears  1 application Both Eyes Q8H  .  aspirin  81 mg Oral Daily  . [COMPLETED] aspirin  300 mg Rectal NOW  . [COMPLETED] bivalirudin      . chlorhexidine  15 mL Mouth/Throat BID  . [COMPLETED] cisatracurium  0.1 mg/kg Intravenous Once  . [COMPLETED] eptifibatide      . [EXPIRED] etomidate      . [COMPLETED] etomidate  25 mg Intravenous Once  . [COMPLETED] heparin      . [EXPIRED] lidocaine (cardiac) 100 mg/53ml      . [COMPLETED] lidocaine      . [COMPLETED] nitroGLYCERIN      . [COMPLETED] norepinephrine (LEVOPHED) Adult infusion  2-50 mcg/min Intravenous Once  . pantoprazole (PROTONIX) IV  40 mg Intravenous QHS  . piperacillin-tazobactam (ZOSYN)  IV  3.375 g Intravenous Q8H  . [COMPLETED] potassium chloride  10 mEq Intravenous Q1 Hr x 4  . potassium chloride  10 mEq Intravenous Q1 Hr x 4  . [COMPLETED] potassium chloride      . [COMPLETED] potassium chloride      . [EXPIRED] rocuronium      . sertraline  50 mg Oral Daily  . simvastatin  40 mg Oral q1800  . sodium chloride  1,000 mL Intravenous Once  . [COMPLETED] sodium chloride  500 mL Intravenous Once  . [COMPLETED] sodium glycerophosphate 0.9% NaCl IVPB  30 mmol Intravenous Once  . [EXPIRED] succinylcholine      . [  COMPLETED] succinylcholine  75 mg Intravenous Once  . [COMPLETED] Ticagrelor  180 mg Per NG tube Once  . Ticagrelor  90 mg Oral BID  . vancomycin  500 mg Intravenous Q12H  . [DISCONTINUED] aspirin  325 mg Oral Daily  . [DISCONTINUED] fentaNYL  100 mcg Intravenous Once  . [DISCONTINUED] heparin subcutaneous  5,000 Units Subcutaneous Q8H  . [DISCONTINUED] midazolam  2 mg Intravenous Once  . [DISCONTINUED] SODIUM GLYCEROPHOSPHATE  1 MMOLE/ML IV SOLN SOLN 30 mmol  30 mmol Intravenous Once  . [DISCONTINUED] sodium phosphate  Dextrose 5% IVPB  30 mmol Intravenous Once     Ht: 5\' 9"  (175.3 cm)  Wt: 120 lb 5.9 oz (54.6 kg)  Ideal Wt: 72.7 kg  % Ideal Wt: 75%  Usual Wt:  Wt Readings from Last 10 Encounters:  05/11/12 120 lb 5.9 oz (54.6 kg)   05/11/12 120 lb 5.9 oz (54.6 kg)  04/24/12 130 lb (58.968 kg)  04/24/12 130 lb (58.968 kg)  04/24/12 130 lb (58.968 kg)  02/11/12 133 lb (60.328 kg)  01/27/12 133 lb (60.328 kg)  07/09/11 135 lb (61.236 kg)  06/16/10 133 lb (60.328 kg)  06/04/10 135 lb 4 oz (61.349 kg)   ~130 lbs per weight hx   % Usual Wt: 92%  Body mass index is 17.78 kg/(m^2). Pt is underweight   Food/Nutrition Related Hx: Pt NPO/CL x 8 days at previous admission   Labs:  CMP     Component Value Date/Time   NA 145 05/11/2012 1020   K 3.4* 05/11/2012 1020   CL 112 05/11/2012 1020   CO2 17* 05/11/2012 0617   GLUCOSE 154* 05/11/2012 1020   BUN 14 05/11/2012 1020   CREATININE 0.70 05/11/2012 1020   CALCIUM 7.1* 05/11/2012 0617   PROT 5.3* 05/10/2012 1835   ALBUMIN 2.7* 05/10/2012 1835   AST 46* 05/10/2012 1835   ALT 23 05/10/2012 1835   ALKPHOS 67 05/10/2012 1835   BILITOT 0.2* 05/10/2012 1835   GFRNONAA 89* 05/11/2012 0617   GFRAA >90 05/11/2012 0617      Intake/Output Summary (Last 24 hours) at 05/11/12 1209 Last data filed at 05/11/12 1100  Gross per 24 hour  Intake 4014.94 ml  Output    200 ml  Net 3814.94 ml     Diet Order: NPO  Supplements/Tube Feeding: none   IVF:    sodium chloride Last Rate: 10 mL/hr (05/10/12 2100)  sodium chloride Last Rate: 10 mL/hr (05/11/12 0024)  sodium chloride Last Rate: 10 mL/hr (05/10/12 2100)  cisatracurium (NIMBEX) infusion Last Rate: 1.5 mcg/kg/min (05/11/12 0800)  fentaNYL infusion INTRAVENOUS Last Rate: 100 mcg/hr (05/11/12 0200)  midazolam (VERSED) infusion Last Rate: 2 mg/hr (05/11/12 1056)  norepinephrine (LEVOPHED) Adult infusion   [DISCONTINUED] eptifibatide Last Rate: 2 mcg/kg/min (05/11/12 0700)  [DISCONTINUED] norepinephrine (LEVOPHED) Adult infusion Last Rate: 12 mcg/min (05/11/12 0800)  Patient is currently intubated on ventilator support.  MV: 5.3 Temp:Temp (24hrs), Avg:92.1 F (33.4 C), Min:91 F (32.8 C), Max:97.2 F (36.2 C)  Propofol: none     Estimated Nutritional Needs:   Kcal: 1208 Protein: 82-95 gm  Fluid: 1.2-1.4 L   Pt just d/c'd after bowel resection. Pt had witnessed arrest at home, CPR and shock x3 with AED. Emergent cardiac cath 12/3, started on hypothermia protocol. Planned to be rewarmed tonight about 9 pm. Pt with 10 lb weight loss (7.6% body weight) in the past month. Likely related to prolonged NPO at last admission.  Recommend early initiation of EN once  pt is rewarmed.   Pt meets criteria for severe malnutrition in the context of acute illness 2/2 weight loss of 7.6% in one month and meeting <50% estimated nutrition needs for >5 days.   NUTRITION DIAGNOSIS: Inadequate oral intake related to inability to eat as evidenced by NPO.  MONITORING/EVALUATION(Goals): Goal: Initiation of EN once pt is rewarmed  Monitor: hypothermia protocol, weight, labs, vent status, initiation of EN   EDUCATION NEEDS: -No education needs identified at this time   Clarene Duke RD, LDN Pager [phone] After Hours pager [phone]  05/11/2012, 12:06 PM

## 2012-05-11 NOTE — H&P (Signed)
PULMONARY  / CRITICAL CARE MEDICINE  Name: BRACH BIRDSALL MRN: [id number] DOB: 09-23-1932    LOS: 1  REFERRING MD:  PMD  CHIEF COMPLAINT:  Cardiac arrest  BRIEF PATIENT DESCRIPTION:  78 yo with CAD, congenital pectus excavatum and recent admission for SBO / lysis of adhesions brought to [facility] ED after suffering VF cardiac arrest and ROSC after brief CPR and shocks x 3.  Hypothermia protocol started. Cathed  LINES / TUBES: 12/3  OETT >>> 12/3  OGT >>> 12/3  Foley >>> 12/3  L [place] CVL >>>  CULTURES: NA  ANTIBIOTICS: NA  SIGNIFICANT EVENTS:  12/3  Bought to [facility] ED after suffering VF cardiac arrest and ROSC after brief CPR and shocks x 3.  Hypothermia protocol started.   12/3- cath- PTCA (balloon angioplasty) only proximal/mid LAD Thrombectomy occluded LAD 12/4- hypothermia continuous, oozing mouth, urine  LEVEL OF CARE:  ICU  PRIMARY SERVICE:  PCCM  CONSULTANTS:  Cardiology  CODE STATUS:  Full  DIET:  NPO  DVT Px:  Heparin off 12/4 bleeding  GI Px:  Protonix   INTERVAL HISTORY: Cath performed, oozing from sites  VITAL SIGNS: Temp:  [91.2 F (32.9 C)-97.2 F (36.2 C)] 91.4 F (33 C) (12/04 0800) Pulse Rate:  [23-138] 95  (12/04 0809) Resp:  [14-33] 30  (12/04 0700) BP: (87-133)/(56-97) 104/71 mmHg (12/04 0809) SpO2:  [96 %-100 %] 100 % (12/04 0809) Arterial Line BP: (98-137)/(68-89) 120/81 mmHg (12/04 0700) FiO2 (%):  [30 %-100 %] 30 % (12/04 0810) Weight:  [54.6 kg (120 lb 5.9 oz)-59 kg (130 lb 1.1 oz)] 54.6 kg (120 lb 5.9 oz) (12/04 0500)  HEMODYNAMICS: CVP:  [5 mmHg-7 mmHg] 6 mmHg  VENTILATOR SETTINGS: Vent Mode:  [-] PRVC FiO2 (%):  [30 %-100 %] 30 % Set Rate:  [14 bmp-18 bmp] 14 bmp Vt Set:  [420 mL-550 mL] 500 mL PEEP:  [5 cmH20] 5 cmH20 Plateau Pressure:  [11 cmH20-15 cmH20] 14 cmH20 INTAKE / OUTPUT: Intake/Output      12/03 0701 - 12/04 0700 12/04 0701 - 12/05 0700   I.V. (mL/kg) 912.3 (16.7) 101.7 (1.9)   Other 2035 25   IV Piggyback 432    Total Intake(mL/kg) 3379.3 (61.9) 126.7 (2.3)   Emesis/NG output 200    Total Output 200    Net +3179.3 +126.7         PHYSICAL EXAMINATION: General:  Appears acutely ill, mechanically ventilated, synchronous Neuro:  rass -4, paralyzed HEENT:  PERRL, OETT / OGT Cardiovascular:  RRR, no m/r/g, L [place] CVL Lungs:  Reduced, pectus excavatum Abdomen:  Soft, nontender, surgical incision C/D/I Musculoskeletal:  Moves all extremities, no edema Skin:  Intact  LABS:  Lab 05/11/12 0617 05/11/12 0613 05/11/12 0500 05/11/12 0206 05/11/12 0144 05/11/12 0032 05/11/12 0021 05/11/12 05/10/12 1940 05/10/12 1837 05/10/12 1835  HGB -- -- 11.9* -- -- -- -- 11.2* -- -- 10.9*  WBC -- -- 28.6* -- -- -- -- 24.5* -- -- 13.1*  PLT -- -- 362 -- -- -- -- 360 -- -- 320  NA -- -- -- 138 -- 137 -- 137 -- -- --  K -- -- -- 3.7 -- 3.2* -- -- -- -- --  CL -- -- -- 109 -- 108 -- 107 -- -- --  CO2 -- -- -- 16* -- 15* -- 17* -- -- --  GLUCOSE -- -- -- 183* -- 198* -- 191* -- -- --  BUN -- -- -- 16 -- 16 -- 15 -- -- --  CREATININE -- -- -- 0.67 -- 0.66 -- 0.69 -- -- --  CALCIUM -- -- -- 7.1* -- 7.3* -- 7.1* -- -- --  MG -- -- -- -- -- -- -- 1.5 -- -- 1.8  PHOS -- -- -- -- -- -- -- 1.8* -- -- 4.5  AST -- -- -- -- -- -- -- -- -- -- 46*  ALT -- -- -- -- -- -- -- -- -- -- 23  ALKPHOS -- -- -- -- -- -- -- -- -- -- 67  BILITOT -- -- -- -- -- -- -- -- -- -- 0.2*  PROT -- -- -- -- -- -- -- -- -- -- 5.3*  ALBUMIN -- -- -- -- -- -- -- -- -- -- 2.7*  APTT -- -- -- 55* -- -- -- -- -- -- 31  INR -- -- -- 1.48 -- -- -- -- -- -- 1.29  LATICACIDVEN -- 1.5 -- -- -- -- -- 1.6 5.7* -- --  TROPONINI >20.00* -- -- -- -- -- -- >20.00* -- 4.43* --  PROCALCITON -- -- -- -- -- -- -- -- -- -- --  PROBNP -- -- -- -- -- -- -- -- -- -- --  O2SATVEN -- -- -- -- -- -- -- -- -- -- --  PHART -- -- -- -- 7.437 -- 7.461* -- -- -- --  PCO2ART -- -- -- -- 23.2* -- 20.7* -- -- -- --  PO2ART -- -- -- -- 276.0* -- 261.0* -- -- -- --    Lab  05/11/12 0756 05/11/12 0611 05/11/12 0527 05/11/12 0434 05/11/12 0318  GLUCAP 147* 153* 165* 160* 164*   IMAGING:  pcxr - bibasil changes, ett wnl, line wnl, inserted catheter? Chronic?  ECG:  Sinus tachycardia on monitor  DIAGNOSES: Active Problems:  HYPERLIPIDEMIA  HYPERTENSION  Asymptomatic old MI (myocardial infarction)  Coronary artery disease  History of coronary artery bypass surgery  Ventricular fibrillation  Cardiac arrest  Acute respiratory failure  Acute encephalopathy  Hyperglycemia  ASSESSMENT / PLAN:  PULMONARY  A:  Acute respiratory failure.  Congenital pectus excavatum, hypothermia P:   Continue paralysis Will produce less co2 by 30% at current goal temp, see ABG Reduce MVm, rate 12 abg in am  pcxr in am   CARDIOVASCULAR  A: VF cardiac arrest. CAD. Dyslipidemia, int VT continues, s/p Angioplasty lad P:  Goal MAP>85 until rewarm, in setting continued VT , will accept lower MAP goals in hopes to reduce levophed beta affect on VT Levophed gtt PRN ASA Zocor Cath lab tonight Consider amio?, per cards Assess qtc Correct lytes, k, mag  RENAL  A:  MIld hypokalemia P:   Goal CVP>10 Trend BMP q4h cvp 6, bolus again consideration, no overt edema on film cmet in am  Avoid free water for now, risk brain edema   GASTROINTESTINAL  A:  Recent SBO / lysis of adhesions. P:   NPO as intubated TF if remains intubated > 24 hours, consider this pm lft in am  ppi  HEMATOLOGIC  A:  Anemia, leukocytosis, stres, demarg, at risk infection, oozing from anticoagulants P:  Trend CBC in am  Cards addressed by dc 2,3 ba etc scd  INFECTIOUS  A:  Leukocytosis, s/p sbo, at risk aspiration, hypothermia NNH 14 PNA / sepsis P:   Add empiric abx aspiration, nosocomial exposure - zosyn, vanc Send BC sputum  ENDOCRINE   A:  Hyperglycemia. P:   ICU Glycemic Control Protocol Phase 1 tsh  NEUROLOGIC  A:  Acute encephalopathy.  High risk anoxia. P:   Goal  RASS -4 Fentanyl / Versed / Nimbex Hold WUA while on Nimbex Hypothermia protocol rewarm planned 9 pm   CLINICAL SUMMARY:  78 yo with congenital pectus excavatum and recent admission for SBO / lysis of adhesions brought to [facility] ED after suffering VF cardiac arrest and ROSC after brief CPR and shocks x 3.  Hypothermia protocol. Some bleeding noted, replacing lytes, intermittent vt, reduce levophed goals  I have personally obtained a history, examined the patient, evaluated laboratory and imaging results, formulated the assessment and plan and placed orders.  CRITICAL CARE:  The patient is critically ill with multiple organ systems failure and requires high complexity decision making for assessment and support, frequent evaluation and titration of therapies, application of advanced monitoring technologies and extensive interpretation of multiple databases. Critical Care Time devoted to patient care services described in this note is 35 minutes.   Nelda Bucks., MD  Pulmonary and Critical Care Medicine [facility] Pager: [phone]  05/11/2012, 9:02 AM

## 2012-05-11 NOTE — Progress Notes (Signed)
Began active rewarming at 2043. Machine change verified by 2 RNs myself and [employer].  Perkins, [place] Elizabeth

## 2012-05-11 NOTE — Progress Notes (Signed)
Patient ID: Louis Reid, male   DOB: [birth year], 78 y.o.   MRN: [id number]   Patient Name: Louis Reid Date of Encounter: 05/11/2012    SUBJECTIVE  Critically ill discussed with his son this morning. He said runs of V. tach. Potassium being vigorously replaced. He is also bleeding from his mouth and he has hematuria.  CURRENT MEDS    . [COMPLETED] sodium chloride  2,000 mL Intravenous Once  . antiseptic oral rinse  1 application Mouth Rinse QID  . artificial tears  1 application Both Eyes Q8H  . [COMPLETED] aspirin  300 mg Rectal NOW  . aspirin  325 mg Oral Daily  . [COMPLETED] bivalirudin      . chlorhexidine  15 mL Mouth/Throat BID  . [COMPLETED] cisatracurium  0.1 mg/kg Intravenous Once  . [COMPLETED] eptifibatide      . [EXPIRED] etomidate      . [COMPLETED] etomidate  25 mg Intravenous Once  . [COMPLETED] heparin      . heparin subcutaneous  5,000 Units Subcutaneous Q8H  . [EXPIRED] lidocaine (cardiac) 100 mg/47ml      . [COMPLETED] lidocaine      . [COMPLETED] nitroGLYCERIN      . [COMPLETED] norepinephrine (LEVOPHED) Adult infusion  2-50 mcg/min Intravenous Once  . pantoprazole (PROTONIX) IV  40 mg Intravenous QHS  . [COMPLETED] potassium chloride  10 mEq Intravenous Q1 Hr x 4  . [COMPLETED] potassium chloride      . [EXPIRED] rocuronium      . sertraline  50 mg Oral Daily  . simvastatin  40 mg Oral q1800  . sodium chloride  1,000 mL Intravenous Once  . [COMPLETED] sodium glycerophosphate 0.9% NaCl IVPB  30 mmol Intravenous Once  . [EXPIRED] succinylcholine      . [COMPLETED] succinylcholine  75 mg Intravenous Once  . [COMPLETED] Ticagrelor  180 mg Per NG tube Once  . Ticagrelor  90 mg Oral BID  . [DISCONTINUED] fentaNYL  100 mcg Intravenous Once  . [DISCONTINUED] midazolam  2 mg Intravenous Once  . [DISCONTINUED] SODIUM GLYCEROPHOSPHATE  1 MMOLE/ML IV SOLN SOLN 30 mmol  30 mmol Intravenous Once  . [DISCONTINUED] sodium phosphate  Dextrose 5% IVPB  30 mmol  Intravenous Once    OBJECTIVE  Filed Vitals:   05/11/12 0400 05/11/12 0500 05/11/12 0600 05/11/12 0700  BP:      Pulse: 77 100 92   Temp: 91.4 F (33 C) 91.4 F (33 C) 91.6 F (33.1 C) 91.4 F (33 C)  TempSrc: Core (Comment) Core (Comment) Core (Comment) Core (Comment)  Resp: 14 14 23 30   Height:      Weight:  120 lb 5.9 oz (54.6 kg)    SpO2: 100% 100% 100%     Intake/Output Summary (Last 24 hours) at 05/11/12 0750 Last data filed at 05/11/12 0700  Gross per 24 hour  Intake 3379.34 ml  Output    200 ml  Net 3179.34 ml   Filed Weights   05/10/12 1847 05/10/12 2200 05/11/12 0500  Weight: 130 lb 1.1 oz (59 kg) 122 lb 2.2 oz (55.4 kg) 120 lb 5.9 oz (54.6 kg)    PHYSICAL EXA HEENT:  Normal, intubated and bleeding from mouth  Neck: Supple without bruits or JVD. Lungs:  Resp regular and unlabored, CTA. Heart: RRR no s3, s4, or murmurs. Abdomen: Soft, non-tender, non-distended, BS + x 4.  Extremities: No clubbing, cyanosis or edema. DP/PT/Radials 2+ and equal bilaterally.  Accessory Clinical Findings  CBC  Basename 05/11/12 0500 05/11/12 05/10/12 1835  WBC 28.6* 24.5* --  NEUTROABS -- -- 10.8*  HGB 11.9* 11.2* --  HCT 35.4* 33.7* --  MCV 93.9 94.7 --  PLT 362 360 --   Basic Metabolic Panel  Basename 05/11/12 0206 05/11/12 0032 05/11/12 05/10/12 1835  NA 138 137 -- --  K 3.7 3.2* -- --  CL 109 108 -- --  CO2 16* 15* -- --  GLUCOSE 183* 198* -- --  BUN 16 16 -- --  CREATININE 0.67 0.66 -- --  CALCIUM 7.1* 7.3* -- --  MG -- -- 1.5 1.8  PHOS -- -- 1.8* 4.5   Liver Function Tests  [facility] 05/10/12 1835  AST 46*  ALT 23  ALKPHOS 67  BILITOT 0.2*  PROT 5.3*  ALBUMIN 2.7*   No results found for this basename: LIPASE:2,AMYLASE:2 in the last 72 hours Cardiac Enzymes  Basename 05/11/12 0617 05/11/12 05/10/12 1837  CKTOTAL -- -- --  CKMB -- -- --  CKMBINDEX -- -- --  TROPONINI >20.00* >20.00* 4.43*   BNP No components found with this basename:  POCBNP:3 D-Dimer No results found for this basename: DDIMER:2 in the last 72 hours Hemoglobin A1C No results found for this basename: HGBA1C in the last 72 hours Fasting Lipid Panel No results found for this basename: CHOL,HDL,LDLCALC,TRIG,CHOLHDL,LDLDIRECT in the last 72 hours Thyroid Function Tests No results found for this basename: TSH,T4TOTAL,FREET3,T3FREE,THYROIDAB in the last 72 hours  TELE Sinus rhythm with runs of VT  ECG ST segment changes anterolaterally.  Radiology/Studies  Dg Abd 1 View  04/25/2012  *RADIOLOGY REPORT*  Clinical Data: Small bowel obstruction.  ABDOMEN - 1 VIEW  Comparison: CT of the abdomen and pelvis on 04/24/2012  Findings: Nasogastric tube extends into the stomach.  There remains dilatation of small bowel.  There is some air and stool present in the colon.  Findings remain likely consistent with a partial small bowel obstruction.  IMPRESSION: Findings consistent with partial small bowel obstruction.   Original Report Authenticated By: Irish Lack, M.D.    Ct Abdomen Pelvis W Contrast  04/24/2012  *RADIOLOGY REPORT*  Clinical Data: Abdominal pain.  CT ABDOMEN AND PELVIS WITH CONTRAST  Technique:  Multidetector CT imaging of the abdomen and pelvis was performed following the standard protocol during bolus administration of intravenous contrast.  Contrast: OMNIPAQUE IOHEXOL 300 MG/ML  SOLN  Comparison: None.  Findings: Lung Bases: Severe pectus excavatum.  Median sternotomy, CABG.  Liver:  No focal mass lesions.  Probable mild intrahepatic biliary ductal dilation.  Periportal edema less likely.  Correlation with bilirubin recommended.  Spleen:  Normal.  Small accessory spleen dorsal to the spleen proper.  The  Gallbladder:  Normal appearance of the gallbladder.  Small ascites over the liver margin.  Common bile duct:  Within normal limits.  Pancreas:  Normal.  Adrenal glands:  Normal.  Kidneys:  Normal enhancement.  Bilateral renal atrophy.  Low density  renal cortical lesions compatible with small renal cysts. Extrarenal pelvis bilaterally.  Ureters grossly appear within normal limits.  Stomach:  Distended with oral contrast.  Small bowel:  Duodenum appears normal. Dilation of the jejunum extending in the anatomic pelvis.  There is decompressed distal small bowel compatible with high-grade if not complete small bowel obstruction.  No pneumatosis, perforation or free air.  Multiple angulated loops of small bowel suggest adhesive obstruction.  No mass lesion is identified.  Colon:   No colonic inflammatory changes.  Portions of the colon are decompressed.  No colonic obstruction.  Normal appendix in the right lower quadrant.  Pelvic Genitourinary:  Small amount of ascites.  Prostatic megaly. Ascites extends into a left inguinal hernia.  Bones:  No aggressive osseous lesions.  Degenerative disc disease most pronounced at L2-L3 and L5-S1.  Vasculature: Atherosclerosis.  No aneurysm.  IMPRESSION: 1.  Small bowel obstruction appears high-grade.  Decompressed terminal ileum is present.  Multiple angulated loops of small bowel suggest adhesive small bowel obstruction. 2.  Small volume of ascites. 3.  Renal cortical atrophy.  Bilateral prominent extrarenal pelvis. 4.  Severe pectus excavatum.  CABG. 5.  Mild intrahepatic biliary ductal dilation.  Correlation with bilirubin is recommended.  Common bile duct appears within normal limits and there are no calcified gallstones.   Original Report Authenticated By: Andreas Newport, M.D.    Dg Chest Port 1 View  05/10/2012  *RADIOLOGY REPORT*  Clinical Data: Acute respiratory failure.  On ventilator.  Central line placement.  PORTABLE CHEST - 1 VIEW  Comparison: Prior today  Findings: A new left subclavian center venous catheter is seen with tip in the distal SVC.  Endotracheal tube and nasogastric tube remain in appropriate position.  No pneumothorax identified.  Decreased lung volumes are seen with increased bibasilar  atelectasis.  Heart size is stable.  Prior CABG again noted.  IMPRESSION:  1.  New left subclavian center venous catheter in appropriate position.  No evidence of pneumothorax. 2.  Increased bibasilar atelectasis.   Original Report Authenticated By: Myles Rosenthal, M.D.    Dg Chest Port 1 View  05/10/2012  *RADIOLOGY REPORT*  Clinical Data: Intubation  PORTABLE CHEST - 1 VIEW  Comparison: None.  Findings: Endotracheal tube in good position.  There is a gastric tube in the proximal esophagus or trachea overlying T1. Repositioning is suggested.  COPD.  Prior CABG.  Lungs are clear without infiltrate or effusion. Left apical scarring  IMPRESSION:  Endotracheal tube in good position.  Gastric tube overlies the trachea or proximal esophagus at the T1 level.  Repositioning is suggested.   Original Report Authenticated By: Janeece Riggers, M.D.    Dg Abd 2 Views  04/26/2012  *RADIOLOGY REPORT*  Clinical Data: Follow up small bowel obstruction.  ABDOMEN - 2 VIEW  Comparison: 04/25/2012 and 04/24/2012  Findings: Upright and supine views of the abdomen were obtained. Again noted are dilated gas-filled loops of small bowel with air- fluid levels.  Nasogastric tube is coiled the stomach.  The degree of small bowel distention has not changed.  Again noted is stool in the right colon and pelvic region.  There is some lucency underneath the right hemidiaphragm but this may be associated with ascites based on the previous CT findings. Left basilar densities could represent atelectasis and cannot exclude a small pleural effusion.  IMPRESSION: Persistent dilatation of small bowel loops with air-fluid levels. There has been minimal change since the previous examination. Findings are compatible with a small bowel obstruction.  Lucency underneath the right hemidiaphragm most likely represents ascites.  Difficult to evaluate for free air on this examination. Further evaluation for free air could be performed with a left lateral decubitus  study.   Original Report Authenticated By: Richarda Overlie, M.D.     ASSESSMENT AND PLAN  Active Problems:  HYPERLIPIDEMIA  HYPERTENSION  Asymptomatic old MI (myocardial infarction)  Coronary artery disease  History of coronary artery bypass surgery  Ventricular fibrillation  Cardiac arrest  Acute respiratory failure  Acute encephalopathy  Hyperglycemia   Critically ill. Discussed  bleeding with Dr. Sanjuana Kava. Will discontinue his Integrilin and subcutaneous heparin. Continue Brilinta. He'll remain on Levophed and cooling protocol.. Replace potassium to greater than 4. If continues to have V. tach we'll have to use amiodarone. Discussed poor prognosis with family. Greater than 30 minutes with clinical care.  Signed, Valera Castle MD    Patient Name: Louis Reid Date of Encounter: 05/11/2012    SUBJECTIVE    CURRENT MEDS    . [COMPLETED] sodium chloride  2,000 mL Intravenous Once  . antiseptic oral rinse  1 application Mouth Rinse QID  . artificial tears  1 application Both Eyes Q8H  . [COMPLETED] aspirin  300 mg Rectal NOW  . aspirin  325 mg Oral Daily  . [COMPLETED] bivalirudin      . chlorhexidine  15 mL Mouth/Throat BID  . [COMPLETED] cisatracurium  0.1 mg/kg Intravenous Once  . [COMPLETED] eptifibatide      . [EXPIRED] etomidate      . [COMPLETED] etomidate  25 mg Intravenous Once  . [COMPLETED] heparin      . heparin subcutaneous  5,000 Units Subcutaneous Q8H  . [EXPIRED] lidocaine (cardiac) 100 mg/41ml      . [COMPLETED] lidocaine      . [COMPLETED] nitroGLYCERIN      . [COMPLETED] norepinephrine (LEVOPHED) Adult infusion  2-50 mcg/min Intravenous Once  . pantoprazole (PROTONIX) IV  40 mg Intravenous QHS  . [COMPLETED] potassium chloride  10 mEq Intravenous Q1 Hr x 4  . [COMPLETED] potassium chloride      . [EXPIRED] rocuronium      . sertraline  50 mg Oral Daily  . simvastatin  40 mg Oral q1800  . sodium chloride  1,000 mL Intravenous Once  . [COMPLETED]  sodium glycerophosphate 0.9% NaCl IVPB  30 mmol Intravenous Once  . [EXPIRED] succinylcholine      . [COMPLETED] succinylcholine  75 mg Intravenous Once  . [COMPLETED] Ticagrelor  180 mg Per NG tube Once  . Ticagrelor  90 mg Oral BID  . [DISCONTINUED] fentaNYL  100 mcg Intravenous Once  . [DISCONTINUED] midazolam  2 mg Intravenous Once  . [DISCONTINUED] SODIUM GLYCEROPHOSPHATE  1 MMOLE/ML IV SOLN SOLN 30 mmol  30 mmol Intravenous Once  . [DISCONTINUED] sodium phosphate  Dextrose 5% IVPB  30 mmol Intravenous Once    OBJECTIVE  Filed Vitals:   05/11/12 0400 05/11/12 0500 05/11/12 0600 05/11/12 0700  BP:      Pulse: 77 100 92   Temp: 91.4 F (33 C) 91.4 F (33 C) 91.6 F (33.1 C) 91.4 F (33 C)  TempSrc: Core (Comment) Core (Comment) Core (Comment) Core (Comment)  Resp: 14 14 23 30   Height:      Weight:  120 lb 5.9 oz (54.6 kg)    SpO2: 100% 100% 100%     Intake/Output Summary (Last 24 hours) at 05/11/12 0750 Last data filed at 05/11/12 0700  Gross per 24 hour  Intake 3379.34 ml  Output    200 ml  Net 3179.34 ml   Filed Weights   05/10/12 1847 05/10/12 2200 05/11/12 0500  Weight: 130 lb 1.1 oz (59 kg) 122 lb 2.2 oz (55.4 kg) 120 lb 5.9 oz (54.6 kg)    PHYSICAL EXAM  General: Pleasant, NAD. Neuro: Alert and oriented X 3. Moves all extremities spontaneously. Psych: Normal affect. HEENT:  Normal  Neck: Supple without bruits or JVD. Lungs:  Resp regular and unlabored, CTA. Heart: RRR no s3, s4, or murmurs. Abdomen: Soft, non-tender,  non-distended, BS + x 4.  Extremities: No clubbing, cyanosis or edema. DP/PT/Radials 2+ and equal bilaterally.  Accessory Clinical Findings  CBC  Basename 05/11/12 0500 05/11/12 05/10/12 1835  WBC 28.6* 24.5* --  NEUTROABS -- -- 10.8*  HGB 11.9* 11.2* --  HCT 35.4* 33.7* --  MCV 93.9 94.7 --  PLT 362 360 --   Basic Metabolic Panel  Basename 05/11/12 0206 05/11/12 0032 05/11/12 05/10/12 1835  NA 138 137 -- --  K 3.7 3.2* -- --    CL 109 108 -- --  CO2 16* 15* -- --  GLUCOSE 183* 198* -- --  BUN 16 16 -- --  CREATININE 0.67 0.66 -- --  CALCIUM 7.1* 7.3* -- --  MG -- -- 1.5 1.8  PHOS -- -- 1.8* 4.5   Liver Function Tests  [facility] 05/10/12 1835  AST 46*  ALT 23  ALKPHOS 67  BILITOT 0.2*  PROT 5.3*  ALBUMIN 2.7*   No results found for this basename: LIPASE:2,AMYLASE:2 in the last 72 hours Cardiac Enzymes  Basename 05/11/12 0617 05/11/12 05/10/12 1837  CKTOTAL -- -- --  CKMB -- -- --  CKMBINDEX -- -- --  TROPONINI >20.00* >20.00* 4.43*   BNP No components found with this basename: POCBNP:3 D-Dimer No results found for this basename: DDIMER:2 in the last 72 hours Hemoglobin A1C No results found for this basename: HGBA1C in the last 72 hours Fasting Lipid Panel No results found for this basename: CHOL,HDL,LDLCALC,TRIG,CHOLHDL,LDLDIRECT in the last 72 hours Thyroid Function Tests No results found for this basename: TSH,T4TOTAL,FREET3,T3FREE,THYROIDAB in the last 72 hours  TELE    Normal sinus rhythm with runs of V. tach  ECG  Anterior ST segment changes  Radiology/Studies  Dg Abd 1 View  04/25/2012  *RADIOLOGY REPORT*  Clinical Data: Small bowel obstruction.  ABDOMEN - 1 VIEW  Comparison: CT of the abdomen and pelvis on 04/24/2012  Findings: Nasogastric tube extends into the stomach.  There remains dilatation of small bowel.  There is some air and stool present in the colon.  Findings remain likely consistent with a partial small bowel obstruction.  IMPRESSION: Findings consistent with partial small bowel obstruction.   Original Report Authenticated By: Irish Lack, M.D.    Ct Abdomen Pelvis W Contrast  04/24/2012  *RADIOLOGY REPORT*  Clinical Data: Abdominal pain.  CT ABDOMEN AND PELVIS WITH CONTRAST  Technique:  Multidetector CT imaging of the abdomen and pelvis was performed following the standard protocol during bolus administration of intravenous contrast.  Contrast: OMNIPAQUE  IOHEXOL 300 MG/ML  SOLN  Comparison: None.  Findings: Lung Bases: Severe pectus excavatum.  Median sternotomy, CABG.  Liver:  No focal mass lesions.  Probable mild intrahepatic biliary ductal dilation.  Periportal edema less likely.  Correlation with bilirubin recommended.  Spleen:  Normal.  Small accessory spleen dorsal to the spleen proper.  The  Gallbladder:  Normal appearance of the gallbladder.  Small ascites over the liver margin.  Common bile duct:  Within normal limits.  Pancreas:  Normal.  Adrenal glands:  Normal.  Kidneys:  Normal enhancement.  Bilateral renal atrophy.  Low density renal cortical lesions compatible with small renal cysts. Extrarenal pelvis bilaterally.  Ureters grossly appear within normal limits.  Stomach:  Distended with oral contrast.  Small bowel:  Duodenum appears normal. Dilation of the jejunum extending in the anatomic pelvis.  There is decompressed distal small bowel compatible with high-grade if not complete small bowel obstruction.  No pneumatosis, perforation or free air.  Multiple angulated loops of small bowel suggest adhesive obstruction.  No mass lesion is identified.  Colon:   No colonic inflammatory changes.  Portions of the colon are decompressed.  No colonic obstruction.  Normal appendix in the right lower quadrant.  Pelvic Genitourinary:  Small amount of ascites.  Prostatic megaly. Ascites extends into a left inguinal hernia.  Bones:  No aggressive osseous lesions.  Degenerative disc disease most pronounced at L2-L3 and L5-S1.  Vasculature: Atherosclerosis.  No aneurysm.  IMPRESSION: 1.  Small bowel obstruction appears high-grade.  Decompressed terminal ileum is present.  Multiple angulated loops of small bowel suggest adhesive small bowel obstruction. 2.  Small volume of ascites. 3.  Renal cortical atrophy.  Bilateral prominent extrarenal pelvis. 4.  Severe pectus excavatum.  CABG. 5.  Mild intrahepatic biliary ductal dilation.  Correlation with bilirubin is  recommended.  Common bile duct appears within normal limits and there are no calcified gallstones.   Original Report Authenticated By: Andreas Newport, M.D.    Dg Chest Port 1 View  05/10/2012  *RADIOLOGY REPORT*  Clinical Data: Acute respiratory failure.  On ventilator.  Central line placement.  PORTABLE CHEST - 1 VIEW  Comparison: Prior today  Findings: A new left subclavian center venous catheter is seen with tip in the distal SVC.  Endotracheal tube and nasogastric tube remain in appropriate position.  No pneumothorax identified.  Decreased lung volumes are seen with increased bibasilar atelectasis.  Heart size is stable.  Prior CABG again noted.  IMPRESSION:  1.  New left subclavian center venous catheter in appropriate position.  No evidence of pneumothorax. 2.  Increased bibasilar atelectasis.   Original Report Authenticated By: Myles Rosenthal, M.D.    Dg Chest Port 1 View  05/10/2012  *RADIOLOGY REPORT*  Clinical Data: Intubation  PORTABLE CHEST - 1 VIEW  Comparison: None.  Findings: Endotracheal tube in good position.  There is a gastric tube in the proximal esophagus or trachea overlying T1. Repositioning is suggested.  COPD.  Prior CABG.  Lungs are clear without infiltrate or effusion. Left apical scarring  IMPRESSION:  Endotracheal tube in good position.  Gastric tube overlies the trachea or proximal esophagus at the T1 level.  Repositioning is suggested.   Original Report Authenticated By: Janeece Riggers, M.D.    Dg Abd 2 Views  04/26/2012  *RADIOLOGY REPORT*  Clinical Data: Follow up small bowel obstruction.  ABDOMEN - 2 VIEW  Comparison: 04/25/2012 and 04/24/2012  Findings: Upright and supine views of the abdomen were obtained. Again noted are dilated gas-filled loops of small bowel with air- fluid levels.  Nasogastric tube is coiled the stomach.  The degree of small bowel distention has not changed.  Again noted is stool in the right colon and pelvic region.  There is some lucency underneath the  right hemidiaphragm but this may be associated with ascites based on the previous CT findings. Left basilar densities could represent atelectasis and cannot exclude a small pleural effusion.  IMPRESSION: Persistent dilatation of small bowel loops with air-fluid levels. There has been minimal change since the previous examination. Findings are compatible with a small bowel obstruction.  Lucency underneath the right hemidiaphragm most likely represents ascites.  Difficult to evaluate for free air on this examination. Further evaluation for free air could be performed with a left lateral decubitus study.   Original Report Authenticated By: Richarda Overlie, M.D.     ASSESSMENT AND PLAN  Active Problems:  HYPERLIPIDEMIA  HYPERTENSION  Asymptomatic old MI (myocardial infarction)  Coronary artery disease  History of coronary artery bypass surgery  Ventricular fibrillation  Cardiac arrest  Acute respiratory failure  Acute encephalopathy  Hyperglycemia    He is critically ill. Discontinue Integrilin with bleeding. Discussed with interventional team. Maintain potassium greater than 4. May need consider amiodarone.  Signed, Valera Castle MD

## 2012-05-11 NOTE — Progress Notes (Signed)
elink notified of bloody, thin secretions from subglottic and oral suction.  Perkins, [place] Elizabeth

## 2012-05-11 NOTE — Procedures (Signed)
Arterial Catheter Insertion Procedure Note KEOLA HENINGER [id number] Apr 04, 1933  Procedure: Insertion of Arterial Catheter  Indications: Blood pressure monitoring and Frequent blood sampling  Procedure Details Consent: Unable to obtain consent because of altered level of consciousness. Time Out: Verified patient identification, verified procedure, site/side was marked, verified correct patient position, special equipment/implants available, medications/allergies/relevent history reviewed, required imaging and test results available.  Performed  Maximum sterile technique was used including antiseptics, cap, gloves, gown, hand hygiene, mask and sheet. Skin prep: Chlorhexidine; local anesthetic administered 20 gauge catheter was inserted into right radial artery using the Seldinger technique.  Evaluation Blood flow good; BP tracing good. Complications: No apparent complications .   Inez Pilgrim 05/11/2012

## 2012-05-11 NOTE — Progress Notes (Signed)
eLink Physician-Brief Progress Note Patient Name: Louis Reid DOB: 04-10-1933 MRN: [id number]  Date of Service  05/11/2012   HPI/Events of Note   Hypophosphatemia  eICU Interventions  Phos replaced   Intervention Category Intermediate Interventions: Electrolyte abnormality - evaluation and management  DETERDING,ELIZABETH 05/11/2012, 12:58 AM

## 2012-05-11 NOTE — Care Management Note (Addendum)
    Page 1 of 2   05/19/2012     2:50:21 PM   CARE MANAGEMENT NOTE 05/19/2012  Patient:  Louis Reid, Louis Reid   Account Number:  [id number]  Date Initiated:  05/11/2012  Documentation initiated by:  Junius Creamer  Subjective/Objective Assessment:   adm w mi, on vent     Action/Plan:   lives alone, pcp dr Andrey Campanile elkins   Anticipated DC Date:  05/20/2012   Anticipated DC Plan:  IP REHAB FACILITY  In-house referral  Clinical Social Worker      DC Planning Services  CM consult      Choice offered to / List presented to:             Status of service:  In process, will continue to follow Medicare Important Message given?   (If response is "NO", the following Medicare IM given date fields will be blank) Date Medicare IM given:   Date Additional Medicare IM given:    Discharge Disposition:    Per UR Regulation:  Reviewed for med. necessity/level of care/duration of stay  If discussed at Long Length of Stay Meetings, dates discussed:   05/17/2012    Comments:  05/19/12 Elliet Goodnow,RN,BSN [phone] PT APPROVED FOR CIR ADMISSION BY INSURANCE.  WILL NEED LIFEVEST PRIOR TO DC TO IP REHAB.  SPOKE WITH ASHLEY STEWART, [employer] REPRESENTATIVE FOR LIFEVEST:  SHE STATES PT LIKELY TO BE FITTED FOR VEST LATER THIS PM OR IN AM. NOTIFIED ATTENDING MD AND REHAB ADMISSIONS LIASION.  WILL PLAN FOR DC TO CIR AFTER LIFEVEST FITTED.  05/17/12 Selah Zelman,RN,BSN [phone] PT S/P WITNESSED CARDIAC ARREST, ACUTE ANTERIOR STEMI ON 05/10/12.  PTA,PT INDEPENDENT, LIVES ALONE.  HE HAS SUPPORTIVE SON AT BEDSIDE.  REHAB CONSULT IN PROGRESS. DISCUSSED DC OPTIONS WITH SON AND PT:   SHOULD [facility] [facility] NOT BE AN OPTION, PT/SON PREFER [facility] FOR REHAB IN [place].  WILL CONSULT CSW TO FACILITATE THIS. WILL FOLLOW PROGRESS.  12/5 15:41p debbie dowell rn,bsn left brilinta 30day free card and copay assist card in shadow chart when needed.  12/4 9:06 debbie dowell rn,bsn [phone]

## 2012-05-11 NOTE — ED Provider Notes (Signed)
History     CSN: [id number]  Arrival date & time 05/10/12  4696   First MD Initiated Contact with Patient 05/10/12 1839     Chief Complaint  Patient presents with  . Post-CPR   HPI: Louis Reid is a 78 yo CM with history of CAD s/p CABG, HTN, HLD, who was DC from our facility on 11/28 after being treated for a SBO. Following his return home he had been well per his son. This afternoon he complained of being more fatigued so he went to lay down. His son went to check on him after several minutes and he was unresponsive. EMS was called, initial first responders placed AED and shocked him 3X. Upon arrival of paramedics he was in atrial fibrillation with adequate blood pressure. No further CPR was performed. On arrival to he ED he had a GCS of 3 and was intubated for airway protection. Further history is not obtainable due to patient's mental status.   Past Medical History  Diagnosis Date  . Acute myocardial infarction of anterolateral wall, episode of care unspecified   . Coronary atherosclerosis of artery bypass graft   . Sinoatrial node dysfunction   . HTN (hypertension)   . HLD (hyperlipidemia)   . Depression   . History of kidney stones   . Congenital funnel chest   . Inguinal hernia   . Osteoporosis     Past Surgical History  Procedure Date  . Coronary artery bypass graft 1996  . Inguinal hernia repair 1996    right, hx  . Transurethral resection of prostate   . Ptca     percutaneous transluminal coronary intervention and brachy therapy, Bruce R. Juanda Chance, MD. EF 60%  . Laparotomy 04/27/2012    Procedure: EXPLORATORY LAPAROTOMY;  Surgeon: Cherylynn Ridges, MD;  Location: [facility] OR;  Service: General;  Laterality: N/A;  . Esophagogastroduodenoscopy 05/08/2012    Procedure: ESOPHAGOGASTRODUODENOSCOPY (EGD);  Surgeon: Charna Elizabeth, MD;  Location: [facility] ENDOSCOPY;  Service: Endoscopy;  Laterality: N/A;  Gunnar Fusi put on for Dr. Loreta Ave , Dr. Loreta Ave will call if she wants another time    Family History    Problem Relation Age of Onset  . Colon cancer Brother   . Diabetes Brother   . Heart disease Father   . Heart disease Brother   . Other Mother     brain tumor    History  Substance Use Topics  . Smoking status: Former Games developer  . Smokeless tobacco: Never Used     Comment: quit in the 1960's  . Alcohol Use: No      Review of Systems  Unable to perform ROS: Mental status change    Allergies  Cold medicine plus  Home Medications  No current outpatient prescriptions on file.  BP 133/88  Pulse 61  Temp 91.4 F (33 C) (Core (Comment))  Resp 14  Ht 5\' 9"  (1.753 m)  Wt 122 lb 2.2 oz (55.4 kg)  BMI 18.04 kg/m2  SpO2 100%  Physical Exam  Constitutional: He appears listless. He appears toxic.  HENT:  Head: Normocephalic and atraumatic.  Mouth/Throat: Mucous membranes are dry. Dental caries present.  Eyes: Right conjunctiva is injected. Left conjunctiva is injected. Right pupil is not reactive. Left pupil is not reactive.  Neck: Trachea normal. Neck supple. No JVD present.  Cardiovascular: S1 normal, S2 normal and normal heart sounds.  An irregularly irregular rhythm present. Tachycardia present.  Exam reveals decreased pulses (1+ X 4).   Pulmonary/Chest: Bradypnea noted. No  respiratory distress. He has decreased breath sounds (bibasilar).    Abdominal: Soft.    Neurological: He appears listless. GCS eye subscore is 1. GCS verbal subscore is 1. GCS motor subscore is 1.       Neurologic exam limited by patient's mental status. Gag intact.     ED Course  INTUBATION Date/Time: 05/10/2012 6:30 PM Performed by: Margie Billet Authorized by: Pollyann Savoy Consent: The procedure was performed in an emergent situation. Patient identity confirmed: arm band Indications: respiratory failure and airway protection Intubation method: video-assisted Patient status: paralyzed (RSI) Preoxygenation: BVM Pretreatment medications: none Sedatives: etomidate Paralytic:  succinylcholine Laryngoscope size: Mac 4 Tube size: 7.5 mm Tube type: cuffed Number of attempts: 1 Cricoid pressure: no Cords visualized: yes Post-procedure assessment: chest rise and CO2 detector Breath sounds: equal Cuff inflated: yes ETT to lip: 23 cm ETT to teeth: 22 cm Tube secured with: ETT holder Chest x-ray interpreted by me and radiologist. Chest x-ray findings: endotracheal tube in appropriate position    Labs Reviewed  GLUCOSE, CAPILLARY - Abnormal; Notable for the following:    Glucose-Capillary 180 (*)     All other components within normal limits  CBC WITH DIFFERENTIAL - Abnormal; Notable for the following:    WBC 13.1 (*)     RBC 3.50 (*)     Hemoglobin 10.9 (*)     HCT 33.7 (*)     Neutrophils Relative 82 (*)     Neutro Abs 10.8 (*)     Lymphocytes Relative 11 (*)     All other components within normal limits  COMPREHENSIVE METABOLIC PANEL - Abnormal; Notable for the following:    Glucose, Bld 199 (*)     Calcium 8.3 (*)     Total Protein 5.3 (*)     Albumin 2.7 (*)     AST 46 (*)     Total Bilirubin 0.2 (*)     GFR calc non Af Amer 70 (*)     GFR calc Af Amer 82 (*)     All other components within normal limits  TROPONIN I - Abnormal; Notable for the following:    Troponin I 4.43 (*)     All other components within normal limits  PROTIME-INR - Abnormal; Notable for the following:    Prothrombin Time 15.8 (*)     All other components within normal limits  CG4 I-STAT (LACTIC ACID) - Abnormal; Notable for the following:    Lactic Acid, Venous 5.64 (*)     All other components within normal limits  CBC - Abnormal; Notable for the following:    WBC 24.5 (*)     RBC 3.56 (*)     Hemoglobin 11.2 (*)     HCT 33.7 (*)     All other components within normal limits  BASIC METABOLIC PANEL - Abnormal; Notable for the following:    Potassium 3.2 (*)     CO2 17 (*)     Glucose, Bld 191 (*)     Calcium 7.1 (*)     GFR calc non Af Amer 89 (*)     All  other components within normal limits  PHOSPHORUS - Abnormal; Notable for the following:    Phosphorus 1.8 (*)     All other components within normal limits  TROPONIN I - Abnormal; Notable for the following:    Troponin I >20.00 (*)  CRITICAL VALUE NOTED.  VALUE IS CONSISTENT WITH PREVIOUSLY REPORTED AND CALLED VALUE.   All  other components within normal limits  LACTIC ACID, PLASMA - Abnormal; Notable for the following:    Lactic Acid, Venous 5.7 (*)     All other components within normal limits  BASIC METABOLIC PANEL - Abnormal; Notable for the following:    Potassium 3.3 (*)  DELTA CHECK NOTED   CO2 17 (*)     Glucose, Bld 193 (*)     Calcium 6.9 (*)     GFR calc non Af Amer 88 (*)     All other components within normal limits  BLOOD GAS, ARTERIAL - Abnormal; Notable for the following:    pH, Arterial 7.461 (*)     pCO2 arterial 20.7 (*)     pO2, Arterial 261.0 (*)     Bicarbonate 15.3 (*)     Acid-base deficit 8.5 (*)     All other components within normal limits  BASIC METABOLIC PANEL - Abnormal; Notable for the following:    Potassium 3.2 (*)     CO2 15 (*)     Glucose, Bld 198 (*)     Calcium 7.3 (*)     All other components within normal limits  BLOOD GAS, ARTERIAL - Abnormal; Notable for the following:    pCO2 arterial 23.2 (*)     pO2, Arterial 276.0 (*)     Bicarbonate 16.2 (*)     Acid-base deficit 7.9 (*)     All other components within normal limits  APTT - Abnormal; Notable for the following:    aPTT 55 (*)     All other components within normal limits  BASIC METABOLIC PANEL - Abnormal; Notable for the following:    CO2 16 (*)     Glucose, Bld 183 (*)     Calcium 7.1 (*)     All other components within normal limits  GLUCOSE, CAPILLARY - Abnormal; Notable for the following:    Glucose-Capillary 139 (*)     All other components within normal limits  GLUCOSE, CAPILLARY - Abnormal; Notable for the following:    Glucose-Capillary 181 (*)     All other  components within normal limits  GLUCOSE, CAPILLARY - Abnormal; Notable for the following:    Glucose-Capillary 171 (*)     All other components within normal limits  GLUCOSE, CAPILLARY - Abnormal; Notable for the following:    Glucose-Capillary 167 (*)     All other components within normal limits  GLUCOSE, CAPILLARY - Abnormal; Notable for the following:    Glucose-Capillary 175 (*)     All other components within normal limits  APTT  MAGNESIUM  PHOSPHORUS  MAGNESIUM  LACTIC ACID, PLASMA  TROPONIN I  LACTIC ACID, PLASMA  URINALYSIS, ROUTINE W REFLEX MICROSCOPIC  URINE CULTURE  CBC  BASIC METABOLIC PANEL  PROTIME-INR   Dg Chest Port 1 View  05/10/2012  *RADIOLOGY REPORT*  Clinical Data: Acute respiratory failure.  On ventilator.  Central line placement.  PORTABLE CHEST - 1 VIEW  Comparison: Prior today  Findings: A new left subclavian center venous catheter is seen with tip in the distal SVC.  Endotracheal tube and nasogastric tube remain in appropriate position.  No pneumothorax identified.  Decreased lung volumes are seen with increased bibasilar atelectasis.  Heart size is stable.  Prior CABG again noted.  IMPRESSION:  1.  New left subclavian center venous catheter in appropriate position.  No evidence of pneumothorax. 2.  Increased bibasilar atelectasis.   Original Report Authenticated By: Myles Rosenthal, M.D.    Dg  Chest Port 1 View  05/10/2012  *RADIOLOGY REPORT*  Clinical Data: Intubation  PORTABLE CHEST - 1 VIEW  Comparison: None.  Findings: Endotracheal tube in good position.  There is a gastric tube in the proximal esophagus or trachea overlying T1. Repositioning is suggested.  COPD.  Prior CABG.  Lungs are clear without infiltrate or effusion. Left apical scarring  IMPRESSION:  Endotracheal tube in good position.  Gastric tube overlies the trachea or proximal esophagus at the T1 level.  Repositioning is suggested.   Original Report Authenticated By: Janeece Riggers, M.D.    1.  Ventricular fibrillation   2. Acute encephalopathy   3. Acute respiratory failure   4. Asymptomatic old MI (myocardial infarction)   5. Cardiac arrest   6. Coronary artery disease   7. History of coronary artery bypass surgery   8. Hyperglycemia    MDM  78 yo CM with history of CAD s/p CABG, HTN, HLD, who was DC from our facility on 11/28 after being treated for a SBO. Found down by family members. Three rounds of ACLS prior to arrival. ROSC with atrial fibrillation on arrival. GCS 3, RSI and successful intubation. CXR with satisfactory placement. Initial EKG without obvious STEMI. Patient with adequate BP without need for recurrent epi or compressions. Critical care and cardiology consulted and they were at bedside shortly after arrival. Discussed case with patient's son and answered all questions. Patient was taken to Cath labs by Cardiology for intervention.   Reviewed imaging, labs and previous medical records, utilized in MDM  Discussed case with Dr. Bernette Mayers, who was present for entire resuscitation.   Clinical Impression 1. Cardiopulmonary arrest       Margie Billet, MD 05/11/12 [phone]

## 2012-05-11 NOTE — Progress Notes (Signed)
eLink Physician-Brief Progress Note Patient Name: Louis Reid DOB: 11/22/1932 MRN: [id number]  Date of Service  05/11/2012   HPI/Events of Note  ABG results post vent change - pH 7.46/20.7/261/15 - resp alkalosis in paralyzed patient on hypothermia protocol  eICU Interventions  Plan: Keep RR at 14 Reduce TV from 550 to 500 Recheck ABG in 1 hour   Intervention Category Intermediate Interventions: Other: (acid/base)  DETERDING,ELIZABETH 05/11/2012, 12:41 AM

## 2012-05-11 NOTE — Progress Notes (Signed)
ANTIBIOTIC CONSULT NOTE - INITIAL  Pharmacy Consult for vancomycin Indication: rule out pneumonia  Allergies  Allergen Reactions  . Cold Medicine Plus (Chlorphen-Pseudoephed-Apap)     COLD MEDICATIONS MAKE HIM "LOOPY"    Patient Measurements: Height: 5\' 9"  (175.3 cm) Weight: 120 lb 5.9 oz (54.6 kg) IBW/kg (Calculated) : 70.7    Vital Signs: Temp: 91.4 F (33 C) (12/04 0800) Temp src: Core (Comment) (12/04 0800) BP: 104/71 mmHg (12/04 0809) Pulse Rate: 95  (12/04 0809) Intake/Output from previous day: 12/03 0701 - 12/04 0700 In: 3379.3 [I.V.:912.3; IV Piggyback:432] Out: 200 [Emesis/NG output:200] Intake/Output from this shift: Total I/O In: 146.7 [I.V.:121.7; Other:25] Out: -   Labs:  Basename 05/11/12 0500 05/11/12 0206 05/11/12 0032 05/11/12 05/10/12 1835  WBC 28.6* -- -- 24.5* 13.1*  HGB 11.9* -- -- 11.2* 10.9*  PLT 362 -- -- 360 320  LABCREA -- -- -- -- --  CREATININE -- 0.67 0.66 0.69 --   Estimated Creatinine Clearance: 59.7 ml/min (by C-G formula based on Cr of 0.67). No results found for this basename: VANCOTROUGH:2,VANCOPEAK:2,VANCORANDOM:2,GENTTROUGH:2,GENTPEAK:2,GENTRANDOM:2,TOBRATROUGH:2,TOBRAPEAK:2,TOBRARND:2,AMIKACINPEAK:2,AMIKACINTROU:2,AMIKACIN:2, in the last 72 hours   Microbiology: Recent Results (from the past 720 hour(s))  SURGICAL PCR SCREEN     Status: Normal   Collection Time   04/27/12  6:55 AM      Component Value Range Status Comment   MRSA, PCR NEGATIVE  NEGATIVE Final    Staphylococcus aureus NEGATIVE  NEGATIVE Final     Medical History: Past Medical History  Diagnosis Date  . Acute myocardial infarction of anterolateral wall, episode of care unspecified   . Coronary atherosclerosis of artery bypass graft   . Sinoatrial node dysfunction   . HTN (hypertension)   . HLD (hyperlipidemia)   . Depression   . History of kidney stones   . Congenital funnel chest   . Inguinal hernia   . Osteoporosis    Assessment: Patient is a 78  y.o M s/p cardiac arrest and currently on hypothermia protocol.  To start abx for empiric coverage for aspiration PNA.  Goal of Therapy:  Vancomycin trough level 15-20 mcg/ml  Plan:  1) vancomycin 500mg  IV q12h  Louis Reid P 05/11/2012,9:26 AM

## 2012-05-12 ENCOUNTER — Inpatient Hospital Stay (HOSPITAL_COMMUNITY): Payer: Medicare Other

## 2012-05-12 DIAGNOSIS — I469 Cardiac arrest, cause unspecified: Secondary | ICD-10-CM

## 2012-05-12 LAB — POCT I-STAT, CHEM 8
BUN: 22 mg/dL (ref 6–23)
Calcium, Ion: 0.97 mmol/L — ABNORMAL LOW (ref 1.13–1.30)
Chloride: 114 mEq/L — ABNORMAL HIGH (ref 96–112)
Chloride: 114 mEq/L — ABNORMAL HIGH (ref 96–112)
Glucose, Bld: 153 mg/dL — ABNORMAL HIGH (ref 70–99)
HCT: 40 % (ref 39.0–52.0)
HCT: 39 % (ref 39.0–52.0)
Potassium: 4.4 mEq/L (ref 3.5–5.1)
Potassium: 4.8 mEq/L (ref 3.5–5.1)
Sodium: 144 mEq/L (ref 135–145)

## 2012-05-12 LAB — POCT I-STAT 3, ART BLOOD GAS (G3+)
Bicarbonate: 15.5 mEq/L — ABNORMAL LOW (ref 20.0–24.0)
Bicarbonate: 14 mEq/L — ABNORMAL LOW (ref 20.0–24.0)
O2 Saturation: 97 %
TCO2: 17 mmol/L (ref 0–100)
pCO2 arterial: 37 mmHg (ref 35.0–45.0)
pCO2 arterial: 34.6 mmHg — ABNORMAL LOW (ref 35.0–45.0)
pH, Arterial: 7.208 — ABNORMAL LOW (ref 7.350–7.450)
pH, Arterial: 7.259 — ABNORMAL LOW (ref 7.350–7.450)
pH, Arterial: 7.206 — ABNORMAL LOW (ref 7.350–7.450)
pO2, Arterial: 91 mmHg (ref 80.0–100.0)
pO2, Arterial: 107 mmHg — ABNORMAL HIGH (ref 80.0–100.0)
pO2, Arterial: 116 mmHg — ABNORMAL HIGH (ref 80.0–100.0)

## 2012-05-12 LAB — BASIC METABOLIC PANEL
CO2: 14 mEq/L — ABNORMAL LOW (ref 19–32)
CO2: 16 mEq/L — ABNORMAL LOW (ref 19–32)
Calcium: 6.7 mg/dL — ABNORMAL LOW (ref 8.4–10.5)
Chloride: 112 mEq/L (ref 96–112)
Chloride: 108 mEq/L (ref 96–112)
GFR calc Af Amer: 77 mL/min — ABNORMAL LOW (ref >90)
Glucose, Bld: 130 mg/dL — ABNORMAL HIGH (ref 70–99)
Potassium: 4.9 mEq/L (ref 3.5–5.1)
Potassium: 4 mEq/L (ref 3.5–5.1)
Sodium: 139 mEq/L (ref 135–145)
Sodium: 136 mEq/L (ref 135–145)

## 2012-05-12 LAB — CBC WITH DIFFERENTIAL/PLATELET
Basophils Relative: 0 % (ref 0–1)
Eosinophils Absolute: 0 10*3/uL (ref 0.0–0.7)
HCT: 38.9 % — ABNORMAL LOW (ref 39.0–52.0)
Hemoglobin: 12.4 g/dL — ABNORMAL LOW (ref 13.0–17.0)
MCH: 31.1 pg (ref 26.0–34.0)
MCHC: 31.9 g/dL (ref 30.0–36.0)
Monocytes Absolute: 1.1 10*3/uL — ABNORMAL HIGH (ref 0.1–1.0)
Monocytes Relative: 5 % (ref 3–12)

## 2012-05-12 LAB — GLUCOSE, CAPILLARY
Glucose-Capillary: 135 mg/dL — ABNORMAL HIGH (ref 70–99)
Glucose-Capillary: 117 mg/dL — ABNORMAL HIGH (ref 70–99)
Glucose-Capillary: 90 mg/dL (ref 70–99)
Glucose-Capillary: 148 mg/dL — ABNORMAL HIGH (ref 70–99)

## 2012-05-12 LAB — T3: T3, Total: 49 ng/dl — ABNORMAL LOW (ref 80.0–204.0)

## 2012-05-12 LAB — COMPREHENSIVE METABOLIC PANEL
ALT: 78 U/L — ABNORMAL HIGH (ref 0–53)
AST: 292 U/L — ABNORMAL HIGH (ref 0–37)
CO2: 15 mEq/L — ABNORMAL LOW (ref 19–32)
Calcium: 6.5 mg/dL — ABNORMAL LOW (ref 8.4–10.5)
Sodium: 143 mEq/L (ref 135–145)
Total Protein: 5.1 g/dL — ABNORMAL LOW (ref 6.0–8.3)

## 2012-05-12 LAB — LACTIC ACID, PLASMA
Lactic Acid, Venous: 1.7 mmol/L (ref 0.5–2.2)
Lactic Acid, Venous: 2 mmol/L (ref 0.5–2.2)

## 2012-05-12 LAB — LACTATE DEHYDROGENASE: LDH: 1301 U/L — ABNORMAL HIGH (ref 94–250)

## 2012-05-12 LAB — MAGNESIUM: Magnesium: 1.4 mg/dL — ABNORMAL LOW (ref 1.5–2.5)

## 2012-05-12 LAB — T4, FREE: Free T4: 1.21 ng/dL (ref 0.80–1.80)

## 2012-05-12 MED ORDER — VANCOMYCIN HCL 500 MG IV SOLR
500.0000 mg | Freq: Two times a day (BID) | INTRAVENOUS | Status: DC
  Administered 2012-05-12 – 2012-05-14 (×5): 500 mg via INTRAVENOUS
  Filled 2012-05-12 (×6): qty 500

## 2012-05-12 MED ORDER — SODIUM CHLORIDE 0.45 % IV SOLN
INTRAVENOUS | Status: DC
  Administered 2012-05-12 – 2012-05-14 (×3): via INTRAVENOUS
  Filled 2012-05-12 (×3): qty 900

## 2012-05-12 MED ORDER — SODIUM CHLORIDE 0.9 % IV SOLN
100.0000 mg | Freq: Every day | INTRAVENOUS | Status: DC
  Filled 2012-05-12: qty 100

## 2012-05-12 MED ORDER — FENTANYL CITRATE 0.05 MG/ML IJ SOLN
100.0000 ug | INTRAMUSCULAR | Status: DC | PRN
  Administered 2012-05-12 – 2012-05-13 (×2): 50 ug via INTRAVENOUS
  Administered 2012-05-13 – 2012-05-16 (×5): 100 ug via INTRAVENOUS
  Filled 2012-05-12 (×7): qty 2

## 2012-05-12 MED ORDER — MIDAZOLAM HCL 5 MG/ML IJ SOLN: 2.0000 mg | INTRAMUSCULAR | Status: DC | PRN

## 2012-05-12 MED ORDER — HYDROCORTISONE SOD SUCCINATE 100 MG IJ SOLR
50.0000 mg | Freq: Four times a day (QID) | INTRAMUSCULAR | Status: DC
  Administered 2012-05-12 – 2012-05-14 (×8): 50 mg via INTRAVENOUS
  Filled 2012-05-12 (×12): qty 1

## 2012-05-12 MED ORDER — MAGNESIUM SULFATE 40 MG/ML IJ SOLN
2.0000 g | Freq: Once | INTRAMUSCULAR | Status: AC
  Administered 2012-05-12: 2 g via INTRAVENOUS
  Filled 2012-05-12: qty 50

## 2012-05-12 MED ORDER — PERFLUTREN LIPID MICROSPHERE
INTRAVENOUS | Status: AC
  Administered 2012-05-12: 9 mL
  Filled 2012-05-12: qty 10

## 2012-05-12 MED ORDER — IOHEXOL 300 MG/ML  SOLN
20.0000 mL | INTRAMUSCULAR | Status: AC
  Administered 2012-05-12 (×2): 20 mL via ORAL

## 2012-05-12 MED ORDER — INSULIN ASPART 100 UNIT/ML ~~LOC~~ SOLN
0.0000 [IU] | SUBCUTANEOUS | Status: DC
  Administered 2012-05-12: 1 [IU] via SUBCUTANEOUS
  Administered 2012-05-13 – 2012-05-14 (×7): 2 [IU] via SUBCUTANEOUS
  Administered 2012-05-15 (×3): 1 [IU] via SUBCUTANEOUS

## 2012-05-12 MED ORDER — HEPARIN (PORCINE) IN NACL 100-0.45 UNIT/ML-% IJ SOLN
1000.0000 [IU]/h | INTRAMUSCULAR | Status: DC
  Administered 2012-05-12: 950 [IU]/h via INTRAVENOUS
  Administered 2012-05-13 – 2012-05-18 (×7): 1000 [IU]/h via INTRAVENOUS
  Filled 2012-05-12 (×10): qty 250

## 2012-05-12 MED ORDER — MIDAZOLAM HCL 2 MG/2ML IJ SOLN
INTRAMUSCULAR | Status: AC
  Administered 2012-05-12: 2 mg
  Filled 2012-05-12: qty 2

## 2012-05-12 MED ORDER — IOHEXOL 300 MG/ML  SOLN
100.0000 mL | Freq: Once | INTRAMUSCULAR | Status: AC | PRN
  Administered 2012-05-12: 100 mL via INTRAVENOUS

## 2012-05-12 MED ORDER — DEXTROSE 5 % IV SOLN: 2.0000 g | Freq: Once | INTRAVENOUS | Status: DC

## 2012-05-12 MED ORDER — SODIUM CHLORIDE 0.9 % IV BOLUS (SEPSIS)
1000.0000 mL | Freq: Once | INTRAVENOUS | Status: AC
  Administered 2012-05-12: 1000 mL via INTRAVENOUS

## 2012-05-12 MED ORDER — SODIUM CHLORIDE 0.45 % IV SOLN: INTRAVENOUS | Status: DC

## 2012-05-12 MED ORDER — SODIUM CHLORIDE 0.9 % IV SOLN
100.0000 mg | Freq: Every day | INTRAVENOUS | Status: AC
  Administered 2012-05-12: 100 mg via INTRAVENOUS
  Filled 2012-05-12: qty 100

## 2012-05-12 NOTE — Progress Notes (Signed)
25 cc of Versed wasted from 50 cc bag. Witnessed per two RN's. P.Jordana Dugue RN and Sedonia Small RN

## 2012-05-12 NOTE — Progress Notes (Signed)
I have had extensive discussions with family  / son. We discussed patients current circumstances and organ failures. We also discussed patient's prior wishes under circumstances such as this. Family has decided to NOT perform resuscitation if arrest but to continue current medical support for now.  Continue aggressive care otherwise  Mcarthur Rossetti. Tyson Alias, MD, FACP Pgr: [phone] [facility]

## 2012-05-12 NOTE — Progress Notes (Signed)
Pt having frequent long runs of multifocal PVCs. Cards and Elink made aware. Orders received for a mag level. Will continue to monitor.  Perkins, [place] Elizabeth

## 2012-05-12 NOTE — Progress Notes (Signed)
Echocardiogram 2D Echocardiogram with Definity has been performed.  Louis Reid FRANCES 05/12/2012, 11:20 AM

## 2012-05-12 NOTE — Progress Notes (Signed)
Cardiology Progress Note Patient Name: Louis Reid Date of Encounter: 05/12/2012, 7:09 AM     Subjective  Rewarming started last night. Remains intubated and sedated on vasopressors. Continues to have frequent ectopy on telemetry. Mg 1.4 this am, supplemented. Hematuria resolved, still with mild oral oozing.   Objective   Telemetry: A.fib --> sinus rhythm 70-110s, Intermittent junctional rhythm. Frequent PVCs/NSVT  Medications: . [COMPLETED] sodium chloride   Intravenous Once  . [COMPLETED] sodium chloride   Intravenous Once  . antiseptic oral rinse  1 application Mouth Rinse QID  . artificial tears  1 application Both Eyes Q8H  . aspirin  81 mg Oral Daily  . chlorhexidine  15 mL Mouth/Throat BID  . [COMPLETED] magnesium sulfate 1 - 4 g bolus IVPB  2 g Intravenous Once  . pantoprazole (PROTONIX) IV  40 mg Intravenous QHS  . piperacillin-tazobactam (ZOSYN)  IV  3.375 g Intravenous Q8H  . [EXPIRED] potassium chloride  10 mEq Intravenous Q1 Hr x 4  . [COMPLETED] potassium chloride      . sertraline  50 mg Oral Daily  . simvastatin  40 mg Oral q1800  . sodium chloride  1,000 mL Intravenous Once  . [COMPLETED] sodium chloride  1,000 mL Intravenous Once  . [COMPLETED] sodium chloride  1,000 mL Intravenous Once  . [COMPLETED] sodium chloride  500 mL Intravenous Once  . [COMPLETED] sodium glycerophosphate 0.9% NaCl IVPB  30 mmol Intravenous Once  . Ticagrelor  90 mg Oral BID  . vancomycin  500 mg Intravenous Q12H  . [DISCONTINUED] aspirin  325 mg Oral Daily  . [DISCONTINUED] heparin subcutaneous  5,000 Units Subcutaneous Q8H   . sodium chloride 10 mL/hr (05/10/12 2100)  . sodium chloride 10 mL/hr (05/11/12 0024)  . sodium chloride 10 mL/hr (05/10/12 2100)  . cisatracurium (NIMBEX) infusion Stopped (05/12/12 0650)  . fentaNYL infusion INTRAVENOUS 100 mcg/hr (05/12/12 0400)  . midazolam (VERSED) infusion 4 mg/hr (05/12/12 0300)  . norepinephrine (LEVOPHED) Adult infusion 39  mcg/min (05/12/12 0650)  . vasopressin (PITRESSIN) infusion - *FOR SHOCK* 0.03 Units/min (05/11/12 1500)  . [DISCONTINUED] eptifibatide 2 mcg/kg/min (05/11/12 0700)  . [DISCONTINUED] norepinephrine (LEVOPHED) Adult infusion 12 mcg/min (05/11/12 0800)    Physical Exam: Temp:  [91 F (32.8 C)-96.8 F (36 C)] 96.8 F (36 C) (12/05 0649) Pulse Rate:  [70-113] 92  (12/05 0649) Resp:  [12-25] 16  (12/05 0649) BP: (65-147)/(52-90) 97/67 mmHg (12/04 1615) SpO2:  [99 %-100 %] 100 % (12/05 0649) Arterial Line BP: (68-133)/(48-84) 110/61 mmHg (12/05 0649) FiO2 (%):  [30 %] 30 % (12/05 0615) Weight:  [131 lb 2.8 oz (59.5 kg)] 131 lb 2.8 oz (59.5 kg) (12/05 0600)  General: Elderly white male, intubated and sedated. Head: Dried blood around mouth. Normocephalic, sclera non-icteric, nares are without discharge Neck: Supple. Negative for carotid bruits or JVD Lungs: Clear anteriorly. Breathing is unlabored on mech vent. Heart: Difficult to fully auscultate due to arctic sun pads. RRR S1 S2 without murmurs, rubs, or gallops.  Abdomen: Soft, non-distended with hypoactive bowel sounds. No rebound/guarding. No obvious abdominal masses. Msk:  Strength and tone appear normal for age. Extremities: Right groin without bleeding or hematoma. No edema. No clubbing or cyanosis. Distal pedal pulses are dopplerable.  Neuro: intubated and sedated Psych: intubated and sedated   Intake/Output Summary (Last 24 hours) at 05/12/12 0709 Last data filed at 05/12/12 0650  Gross per 24 hour  Intake 2414.23 ml  Output    420  ml  Net 1994.23 ml    Labs:  Basename 05/12/12 0634 05/12/12 0411 05/12/12 0357 05/11/12 0617 05/11/12 05/10/12 1835  NA 143 143 -- -- -- --  K 4.8 4.6 -- -- -- --  CL 114* 115* -- -- -- --  CO2 -- 15* -- 17* -- --  GLUCOSE 149* 156* -- -- -- --  BUN 22 17 -- -- -- --  CREATININE 0.90 0.86 -- -- -- --  CALCIUM -- 6.5* -- 7.1* -- --  MG -- -- 1.4* -- 1.5 --  PHOS -- -- -- -- 1.8* 4.5    Basename 05/12/12 0411 05/10/12 1835  AST 292* 46*  ALT 78* 23  ALKPHOS 69 67  BILITOT 0.2* 0.2*  PROT 5.1* 5.3*  ALBUMIN 2.3* 2.7*   Basename 05/12/12 0634 05/12/12 0411 05/11/12 0500 05/10/12 1835  WBC -- 22.4* 28.6* --  NEUTROABS -- 20.7* -- 10.8*  HGB 13.3 12.4* -- --  HCT 39.0 38.9* -- --  MCV -- 97.5 93.9 --  PLT -- 395 362 --   Basename 05/11/12 0617 05/11/12 05/10/12 1837  TROPONINI >20.00* >20.00* 4.43*   Basename 05/11/12 1017  TSH 9.927*   Radiology/Studies:   05/11/2012 - PORTABLE CHEST - 1 VIEW   Findings: Endotracheal tube is in satisfactory position. Nasogastric tube is followed into the stomach.  Left subclavian central line tip projects over the SVC.  Defibrillator pads are in place.  A wired line tip projects over the lower mediastinum. Question if this is external to the patient.  Bibasilar air space disease, with slight worsening on the right.  No pleural fluid.  IMPRESSION: Bibasilar air space disease with slight interval worsening on the right.     05/09/12 - Cardiac Cath Hemodynamic Findings:  Central aortic pressure: 103/67  Left ventricular pressure: 104/13/20  Angiographic Findings:  Left main: Mild plaque disease.  Left Anterior Descending Artery: Large caliber vessel with stents noted extending from the proximal vessel into the mid vessel. There is a total occlusion of the proximal LAD within the most proximal segment of the stent. There is no flow into the mid and distal vessel. The diagonal branch fills from the patent vein graft. The LIMA to the mid LAD is known to be atretic by cath in 2003 and was not engaged.  Circumflex Artery: 99% proximal subtotal occlusion, unchanged from cath in 2003. The first and second obtuse marginal branches fill from the patent vein graft.  Right Coronary Artery: Large, dominant artery with 60% ostial stenosis, 70% proximal stenosis and diffuse 50% disease in the mid vessel. The PDA is small in caliber. The  posterolateral branch is moderate sized and has mild disease.  Graft Anatomy:  SVG sequential to OM1/OM2 is patent with mild irregularities in the body of the graft and a prominent valve mid body of graft but no obstructive lesions.  SVG to diagonal is patent with mild irregularities in the mid body of the graft.  SVG to RCA is known to be occluded and was not engaged.  LIMA to mid LAD is known to be atretic and was not engaged.  Left Ventricular Angiogram: LVEF=20% with akinesis of the anterior Louis Reid and apex.  Impression:  1. 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  2. Salvage PCI with balloon angioplasty and thrombectomy of the occluded LAD  3. Acute anterior STEMI secondary to occluded LAD  4. Cardiac arrest, out of  hospital.  5. Severe LV systolic dysfunction  Recommendations: He will be admitted to the CCU under the care of the PCCM service. Cooling protocol. Will run Integrilin for 18 hours. Will load with Brilinta 180 mg tonight then will give 90 mg per NG BID. ASA/statin/beta blocker as tolerated. Poor prognosis. Discussed case at length with his son.     Assessment and Plan   1. VF Cardiac Arrest 2. Acute Anterior STEMI s/p balloon angioplasty and thrombectomy of occluded LAD 3. Coronary Artery Disease w/ h/o 5v CABG 4. Acute Respiratory Failure 5. Acute Encephalopathy  6. Ventricular Tachycardia 7. Atrial Fibrillation 8. Hypotension 9. Anemia w/ oozing on anticoagulants 10. Leukocytosis 11. Hypokalemia 12. Hypomagnesemia  13. Hyperglycemia 14. Elevated TSH 15. Recent SBO/Lysis of adhesions  Patient is critically ill after an out of hospital cardiac arrest. He underwent PTCA of occluded LAD on 12/3. Integrilin and subq heparin stopped due to hematuria and oral oozing, which has improved. Continues on Brilinta. Rewarming started last night. Remains intubated, sedated, and on vasopressors.  Telemetry reveals atrial fibrillation with conversion to sinus rhythm this morning, but with intermittent junctional rhythm and frequent ectopy/NSVT. Magnesium only 1.4 this morning, supplemented. Will recheck later this afternoon. Potassium ok. Cont to monitor on telemetry.  Signed, HOPE, JESSICA [name]  Patient examined and agree except changes made. He is in cardiogenic shock with high dose pressor support. Will continue to rewarm and paralytic is off. Will talk with family and recommend NCB status and EOL decisions. We will pray for a miracle.  Valera Castle, MD 05/12/2012 8:04 AM

## 2012-05-12 NOTE — Progress Notes (Signed)
PULMONARY  / CRITICAL CARE MEDICINE  Name: Louis Reid MRN: [id number] DOB: 04-06-1933    LOS: 2  REFERRING MD:  PMD  CHIEF COMPLAINT:  Cardiac arrest  BRIEF PATIENT DESCRIPTION:  78 yo with CAD, congenital pectus excavatum and recent admission for SBO / lysis of adhesions brought to [facility] ED after suffering VF cardiac arrest and ROSC after brief CPR and shocks x 3.  Hypothermia protocol started. Cathed  LINES / TUBES: 12/3  OETT >>> 12/3  OGT >>> 12/3  Foley >>> 12/3  L [place] CVL >>>  CULTURES: NA  ANTIBIOTICS: NA  SIGNIFICANT EVENTS:  12/3  Bought to [facility] ED after suffering VF cardiac arrest and ROSC after brief CPR and shocks x 3.  Hypothermia protocol started.   12/3- cath- PTCA (balloon angioplasty) only proximal/mid LAD Thrombectomy occluded LAD 12/4- hypothermia continuous, oozing mouth, urine 12/5-acidosis, bleeding resolved, shock worse  LEVEL OF CARE:  ICU  PRIMARY SERVICE:  PCCM  CONSULTANTS:  Cardiology  CODE STATUS:  Full  DIET:  TF none  DVT Px:  Heparin off 12/4 bleeding  GI Px:  Protonix   INTERVAL HISTORY: Arrythmia, acidosis, increased levophed, rewarming  VITAL SIGNS: Temp:  [91.2 F (32.9 C)-97.5 F (36.4 C)] 97.5 F (36.4 C) (12/05 0800) Pulse Rate:  [70-113] 101  (12/05 0800) Resp:  [12-25] 16  (12/05 0800) BP: (65-147)/(49-90) 135/67 mmHg (12/05 0745) SpO2:  [99 %-100 %] 99 % (12/05 0800) Arterial Line BP: (68-133)/(48-84) 88/56 mmHg (12/05 0800) FiO2 (%):  [30 %] 30 % (12/05 0745) Weight:  [59.5 kg (131 lb 2.8 oz)] 59.5 kg (131 lb 2.8 oz) (12/05 0600)  HEMODYNAMICS: CVP:  [4 mmHg-7 mmHg] 4 mmHg  VENTILATOR SETTINGS: Vent Mode:  [-] PRVC FiO2 (%):  [30 %] 30 % Set Rate:  [12 bmp-16 bmp] 16 bmp Vt Set:  [450 mL-500 mL] 500 mL PEEP:  [5 cmH20] 5 cmH20 Plateau Pressure:  [12 cmH20-15 cmH20] 15 cmH20 INTAKE / OUTPUT: Intake/Output      12/04 0701 - 12/05 0700 12/05 0701 - 12/06 0700   I.V. (mL/kg) 1958.2 (32.9) 44 (0.7)   Other  0    IV Piggyback 510    Total Intake(mL/kg) 2468.2 (41.5) 44 (0.7)   Urine (mL/kg/hr) 450 (0.3) 50   Emesis/NG output     Total Output 450 50   Net +2018.2 -6         PHYSICAL EXAMINATION: General:  Appears acutely ill, mechanically ventilated, synchronous Neuro:  rass -4, paralyzed off HEENT:  PERRL sluggish 2 mm Cardiovascular:  RRR, no m/r/g, L [place] CVL clean Lungs:  Reduced, ronchi,pectus excavatum Abdomen:  Soft, nontender, surgical incision C/D/I, hypoactive Musculoskeletal:  Moves all extremities, no edema Skin:  Intact  LABS:  Lab 05/12/12 0731 05/12/12 0634 05/12/12 0438 05/12/12 0414 05/12/12 0411 05/12/12 0357 05/12/12 0157 05/11/12 1242 05/11/12 0617 05/11/12 0613 05/11/12 0500 05/11/12 0206 05/11/12 05/10/12 1837 05/10/12 1835  HGB -- 13.3 -- -- 12.4* -- 13.6 -- -- -- -- -- -- -- --  WBC -- -- -- -- 22.4* -- -- -- -- -- 28.6* -- 24.5* -- --  PLT -- -- -- -- 395 -- -- -- -- -- 362 -- 360 -- --  NA -- 143 -- -- 143 -- 144 -- -- -- -- -- -- -- --  K -- 4.8 -- -- 4.6 -- -- -- -- -- -- -- -- -- --  CL -- 114* -- -- 115* -- 114* -- -- -- -- -- -- -- --  CO2 -- -- -- -- 15* -- -- -- 17* -- -- 16* -- -- --  GLUCOSE -- 149* -- -- 156* -- 153* -- -- -- -- -- -- -- --  BUN -- 22 -- -- 17 -- 18 -- -- -- -- -- -- -- --  CREATININE -- 0.90 -- -- 0.86 -- 0.80 -- -- -- -- -- -- -- --  CALCIUM -- -- -- -- 6.5* -- -- -- 7.1* -- -- 7.1* -- -- --  MG -- -- -- -- -- 1.4* -- -- -- -- -- -- 1.5 -- 1.8  PHOS -- -- -- -- -- -- -- -- -- -- -- -- 1.8* -- 4.5  AST -- -- -- -- 292* -- -- -- -- -- -- -- -- -- 46*  ALT -- -- -- -- 78* -- -- -- -- -- -- -- -- -- 23  ALKPHOS -- -- -- -- 69 -- -- -- -- -- -- -- -- -- 67  BILITOT -- -- -- -- 0.2* -- -- -- -- -- -- -- -- -- 0.2*  PROT -- -- -- -- 5.1* -- -- -- -- -- -- -- -- -- 5.3*  ALBUMIN -- -- -- -- 2.3* -- -- -- -- -- -- -- -- -- 2.7*  APTT -- -- -- -- -- -- -- -- -- -- -- 55* -- -- 31  INR -- -- -- -- -- -- -- -- -- -- -- 1.48 -- -- 1.29   LATICACIDVEN -- -- 1.7 -- -- -- -- -- -- 1.5 -- -- 1.6 -- --  TROPONINI -- -- -- -- -- -- -- -- >20.00* -- -- -- >20.00* 4.43* --  PROCALCITON -- -- -- -- -- -- -- -- -- -- -- -- -- -- --  PROBNP -- -- -- -- -- -- -- -- -- -- -- -- -- -- --  O2SATVEN -- -- -- -- -- -- -- -- -- -- -- -- -- -- --  PHART 7.259* -- -- 7.208* -- -- -- 7.365 -- -- -- -- -- -- --  PCO2ART 34.6* -- -- 37.0 -- -- -- 31.0* -- -- -- -- -- -- --  PO2ART 107.0* -- -- 91.0 -- -- -- 135.0* -- -- -- -- -- -- --    Lab 05/12/12 0327 05/11/12 2349 05/11/12 2236 05/11/12 1959 05/11/12 1737  GLUCAP 135* 137* 121* 141* 138*   IMAGING:  pcxr - unchanged bilateral patchy infiltrates, ett wnl  ECG:  Sinus tachycardia on monitor  DIAGNOSES: Active Problems:  HYPERLIPIDEMIA  HYPERTENSION  Asymptomatic old MI (myocardial infarction)  Coronary artery disease  History of coronary artery bypass surgery  Ventricular fibrillation  Cardiac arrest  Acute respiratory failure  Acute encephalopathy  Hyperglycemia  ASSESSMENT / PLAN:  PULMONARY  A:  Acute respiratory failure.  Congenital pectus excavatum, hypothermia, uncompensated met acidosis Some restrictive dz, anatomy P:   abg reviewed after some MV increase -increase rate further, repeat abg in 1 hr -pcxr in am  -abg in am  -see renal  -remains on min O2 support -consider bicarb for NON AG  CARDIOVASCULAR  A: VF cardiac arrest. CAD. Dyslipidemia, int VT continues, s/p Angioplasty lad P:  Goal MAP>60 as rewarm Levophed gtt for above new map Keep vaso, did well with this ASA Zocor, hold lft rising Correct lytes mag Echo now, refractory shock Repeat lactic acid cvp low still, consider further bolus May need swan, volume status? Cortisol then stress steroids See tsh , endocrine  RENAL  A:  MIld hypoMag, NON AG  And small AG after albumin correction P:   Goal CVP>10, have not me this, consider swan, what is his real volume status Trend BMP q12h mag   Additional replacement  GASTROINTESTINAL  A:  Recent SBO / lysis of adhesions, refractory shock, r/o dead bowel, asbcess P:   Npo remain, concern abdo ppi lft rising slight, repeat, dc zocor Acute hep panel Ct abdo/pelvis Repeat lactic, ldh  HEMATOLOGIC  A:  Anemia, leukocytosis P:  Trend CBC in am  scd  INFECTIOUS  A:  Leukocytosis, s/p sbo, at risk aspiration, r/o abdominal process P:   Continue zosyn, vanc Add myco CT abdo/pelvis  ENDOCRINE   A:  Hyperglycemia. Controlled, r;o sick euthyroid P:   ICU Glycemic Control Protocol Phase 1 tsh noted Add t4 t3  NEUROLOGIC  A:  Acute encephalopathy.  High risk anoxia. P:   re warm now, then dc paralysis and wua Add ct head If sedation re required use fent drip only  CLINICAL SUMMARY:  78 yo with congenital pectus excavatum and recent admission for SBO / lysis of adhesions brought to [facility] ED after suffering VF cardiac arrest and ROSC after brief CPR and shocks x 3.  Hypothermia protocol. Worsening acidosis, pressors, abdominal process? For CT.  I have personally obtained a history, examined the patient, evaluated laboratory and imaging results, formulated the assessment and plan and placed orders.  CRITICAL CARE:  The patient is critically ill with multiple organ systems failure and requires high complexity decision making for assessment and support, frequent evaluation and titration of therapies, application of advanced monitoring technologies and extensive interpretation of multiple databases. Critical Care Time devoted to patient care services described in this note is 50 minutes.   Nelda Bucks., MD  Pulmonary and Critical Care Medicine [facility] Pager: [phone]  05/12/2012, 9:06 AM

## 2012-05-12 NOTE — Progress Notes (Signed)
eLink Physician-Brief Progress Note Patient Name: Louis Reid DOB: 1933/01/02 MRN: [id number]  Date of Service  05/12/2012   HPI/Events of Note   Acidosis on ABG, primarily metabolic.   eICU Interventions  Adjusted Ve - Vt back to 500, RR to 16.  Follow ABG and check lactate   Intervention Category Major Interventions: Respiratory failure - evaluation and management  Dalonte Hardage S. 05/12/2012, 4:29 AM

## 2012-05-12 NOTE — Progress Notes (Signed)
eLink Physician-Brief Progress Note Patient Name: ORELL HURTADO DOB: 08-17-1932 MRN: [id number]  Date of Service  05/12/2012   HPI/Events of Note   PE lingula on CT angio  eICU Interventions   Heparin gtt started per pharmacy   Intervention Category Intermediate Interventions: Other:  Lonia Farber 05/12/2012, 8:04 PM

## 2012-05-12 NOTE — Progress Notes (Signed)
ANTICOAGULATION CONSULT NOTE - Initial Consult  Pharmacy Consult for Heparin  Indication: pulmonary embolus  Allergies  Allergen Reactions  . Cold Medicine Plus (Chlorphen-Pseudoephed-Apap)     COLD MEDICATIONS MAKE HIM "LOOPY"    Patient Measurements: Height: 5\' 9"  (175.3 cm) Weight: 131 lb 2.8 oz (59.5 kg) IBW/kg (Calculated) : 70.7   Vital Signs: Temp: 97.6 F (36.4 C) (12/05 1600) Temp src: Oral (12/05 1600) BP: 100/56 mmHg (12/05 1949) Pulse Rate: 88  (12/05 1949)  Labs:  Alvira Philips 05/12/12 0922 05/12/12 0634 05/12/12 0411 05/12/12 0157 05/11/12 0617 05/11/12 0500 05/11/12 0206 05/11/12 05/10/12 1837 05/10/12 1835  HGB -- 13.3 12.4* -- -- -- -- -- -- --  HCT -- 39.0 38.9* 40.0 -- -- -- -- -- --  PLT -- -- 395 -- -- 362 -- 360 -- --  APTT -- -- -- -- -- -- 55* -- -- 31  LABPROT -- -- -- -- -- -- 17.5* -- -- 15.8*  INR -- -- -- -- -- -- 1.48 -- -- 1.29  HEPARINUNFRC -- -- -- -- -- -- -- -- -- --  CREATININE 1.05 0.90 0.86 -- -- -- -- -- -- --  CKTOTAL -- -- -- -- -- -- -- -- -- --  CKMB -- -- -- -- -- -- -- -- -- --  TROPONINI -- -- -- -- >20.00* -- -- >20.00* 4.43* --    Estimated Creatinine Clearance: 49.6 ml/min (by C-G formula based on Cr of 1.05).   Medical History: Past Medical History  Diagnosis Date  . Acute myocardial infarction of anterolateral wall, episode of care unspecified   . Coronary atherosclerosis of artery bypass graft   . Sinoatrial node dysfunction   . HTN (hypertension)   . HLD (hyperlipidemia)   . Depression   . History of kidney stones   . Congenital funnel chest   . Inguinal hernia   . Osteoporosis    Assessment: Louis Reid is a 78 year old male s/p hypothermia protocol for cardiac arrest to start heparin per pharmacy for PE seen on CT angio today. He has since been rewarmed today. He has had some bleeding from the mouth as well as hematuria which seems to be decreasing per his nurse. His cbc is unremarkable and plts are wnl. Noted  that he is ~10kg less than his ideal body weight.   Goal of Therapy:  Heparin level 0.3-0.7 units/ml Monitor platelets by anticoagulation protocol: Yes   Plan:  Based on the above information, will not bolus heparin Begin heparin drip at 950 units/hr  F/u with 8 hour heparin level ok to draw with am labs  Daily HL and CBC  Thank you,  Brett Fairy, PharmD, BCPS 05/12/2012 8:20 PM

## 2012-05-13 ENCOUNTER — Inpatient Hospital Stay (HOSPITAL_COMMUNITY): Payer: Medicare Other

## 2012-05-13 LAB — BLOOD GAS, ARTERIAL
Bicarbonate: 16.8 mEq/L — ABNORMAL LOW (ref 20.0–24.0)
O2 Saturation: 99.6 %
Patient temperature: 98.6
TCO2: 17.6 mmol/L (ref 0–100)
pH, Arterial: 7.419 (ref 7.350–7.450)
pO2, Arterial: 129 mmHg — ABNORMAL HIGH (ref 80.0–100.0)

## 2012-05-13 LAB — MAGNESIUM: Magnesium: 2.1 mg/dL (ref 1.5–2.5)

## 2012-05-13 LAB — COMPREHENSIVE METABOLIC PANEL
ALT: 50 U/L (ref 0–53)
AST: 111 U/L — ABNORMAL HIGH (ref 0–37)
Alkaline Phosphatase: 68 U/L (ref 39–117)
CO2: 17 mEq/L — ABNORMAL LOW (ref 19–32)
Calcium: 6.6 mg/dL — ABNORMAL LOW (ref 8.4–10.5)
Chloride: 111 mEq/L (ref 96–112)
GFR calc Af Amer: 81 mL/min — ABNORMAL LOW (ref >90)
GFR calc non Af Amer: 70 mL/min — ABNORMAL LOW (ref >90)
Glucose, Bld: 131 mg/dL — ABNORMAL HIGH (ref 70–99)
Sodium: 139 mEq/L (ref 135–145)
Total Bilirubin: 0.2 mg/dL — ABNORMAL LOW (ref 0.3–1.2)

## 2012-05-13 LAB — CBC
MCHC: 33 g/dL (ref 30.0–36.0)
RDW: 14.2 % (ref 11.5–15.5)

## 2012-05-13 LAB — CBC WITH DIFFERENTIAL/PLATELET
Basophils Absolute: 0 10*3/uL (ref 0.0–0.1)
Basophils Relative: 0 % (ref 0–1)
Eosinophils Absolute: 0 10*3/uL (ref 0.0–0.7)
Hemoglobin: 10.3 g/dL — ABNORMAL LOW (ref 13.0–17.0)
MCH: 31.5 pg (ref 26.0–34.0)
MCHC: 33.6 g/dL (ref 30.0–36.0)
Monocytes Relative: 7 % (ref 3–12)
Neutro Abs: 12.1 10*3/uL — ABNORMAL HIGH (ref 1.7–7.7)
Neutrophils Relative %: 89 % — ABNORMAL HIGH (ref 43–77)
Platelets: 313 10*3/uL (ref 150–400)
RDW: 14.4 % (ref 11.5–15.5)

## 2012-05-13 LAB — BASIC METABOLIC PANEL
CO2: 16 mEq/L — ABNORMAL LOW (ref 19–32)
Calcium: 6.3 mg/dL — CL (ref 8.4–10.5)
Calcium: 5.4 mg/dL — CL (ref 8.4–10.5)
Chloride: 111 mEq/L (ref 96–112)
Creatinine, Ser: 0.95 mg/dL (ref 0.50–1.35)
GFR calc Af Amer: >90 mL/min (ref >90)
GFR calc non Af Amer: 85 mL/min — ABNORMAL LOW (ref >90)
Sodium: 138 mEq/L (ref 135–145)
Sodium: 140 mEq/L (ref 135–145)

## 2012-05-13 LAB — GLUCOSE, CAPILLARY
Glucose-Capillary: 107 mg/dL — ABNORMAL HIGH (ref 70–99)
Glucose-Capillary: 111 mg/dL — ABNORMAL HIGH (ref 70–99)
Glucose-Capillary: 115 mg/dL — ABNORMAL HIGH (ref 70–99)
Glucose-Capillary: 119 mg/dL — ABNORMAL HIGH (ref 70–99)
Glucose-Capillary: 156 mg/dL — ABNORMAL HIGH (ref 70–99)

## 2012-05-13 LAB — HEPARIN LEVEL (UNFRACTIONATED)
Heparin Unfractionated: 0.26 IU/mL — ABNORMAL LOW (ref 0.30–0.70)
Heparin Unfractionated: 0.6 IU/mL (ref 0.30–0.70)

## 2012-05-13 LAB — CULTURE, RESPIRATORY W GRAM STAIN

## 2012-05-13 MED ORDER — POTASSIUM CHLORIDE 20 MEQ/15ML (10%) PO LIQD
40.0000 meq | Freq: Once | ORAL | Status: AC
  Administered 2012-05-13: 40 meq
  Filled 2012-05-13: qty 30

## 2012-05-13 MED ORDER — BIOTENE DRY MOUTH MT LIQD: 15.0000 mL | Freq: Four times a day (QID) | OROMUCOSAL | Status: DC

## 2012-05-13 MED ORDER — PRO-STAT SUGAR FREE PO LIQD
30.0000 mL | Freq: Three times a day (TID) | ORAL | Status: DC
  Administered 2012-05-13 – 2012-05-14 (×5): 30 mL
  Filled 2012-05-13 (×9): qty 30

## 2012-05-13 MED ORDER — OSMOLITE 1.5 CAL PO LIQD
1000.0000 mL | ORAL | Status: DC
  Administered 2012-05-13 – 2012-05-14 (×2): 1000 mL
  Filled 2012-05-13 (×4): qty 1000

## 2012-05-13 MED ORDER — CHLORHEXIDINE GLUCONATE 0.12 % MT SOLN
15.0000 mL | Freq: Two times a day (BID) | OROMUCOSAL | Status: DC
  Administered 2012-05-13 – 2012-05-20 (×13): 15 mL via OROMUCOSAL
  Filled 2012-05-13 (×15): qty 15

## 2012-05-13 MED ORDER — MIDAZOLAM HCL 2 MG/2ML IJ SOLN
2.0000 mg | INTRAMUSCULAR | Status: DC | PRN
  Administered 2012-05-13: 2 mg via INTRAVENOUS
  Filled 2012-05-13: qty 2

## 2012-05-13 MED ORDER — ADULT MULTIVITAMIN LIQUID CH
5.0000 mL | Freq: Every day | ORAL | Status: DC
  Administered 2012-05-13 – 2012-05-17 (×4): 5 mL
  Filled 2012-05-13 (×5): qty 5

## 2012-05-13 MED ORDER — SODIUM CHLORIDE 0.9 % IV BOLUS (SEPSIS)
500.0000 mL | Freq: Once | INTRAVENOUS | Status: AC
  Administered 2012-05-13: 500 mL via INTRAVENOUS

## 2012-05-13 MED ORDER — OSMOLITE 1.2 CAL PO LIQD
1000.0000 mL | ORAL | Status: DC
  Filled 2012-05-13 (×2): qty 1000

## 2012-05-13 MED ORDER — POTASSIUM CHLORIDE 20 MEQ/15ML (10%) PO LIQD
60.0000 meq | Freq: Once | ORAL | Status: AC
  Administered 2012-05-13: 60 meq

## 2012-05-13 MED ORDER — POTASSIUM CHLORIDE 20 MEQ/15ML (10%) PO LIQD
ORAL | Status: AC
  Administered 2012-05-13: 60 meq
  Filled 2012-05-13: qty 45

## 2012-05-13 MED ORDER — SODIUM CHLORIDE 0.9 % IV SOLN
1.0000 g | Freq: Once | INTRAVENOUS | Status: AC
  Administered 2012-05-13: 1 g via INTRAVENOUS
  Filled 2012-05-13: qty 10

## 2012-05-13 MED ORDER — CHLORHEXIDINE GLUCONATE 0.12 % MT SOLN: 15.0000 mL | Freq: Two times a day (BID) | OROMUCOSAL | Status: DC

## 2012-05-13 NOTE — Progress Notes (Signed)
eLink Physician-Brief Progress Note Patient Name: RAYDAN SCHLABACH DOB: 07-23-1932 MRN: [id number]  Date of Service  05/13/2012   HPI/Events of Note   Hypokalemia  eICU Interventions  Potassium replaced   Intervention Category Intermediate Interventions: Electrolyte abnormality - evaluation and management Minor Interventions: Electrolytes abnormality - evaluation and management  Antoine Fiallos 05/13/2012, 10:17 PM

## 2012-05-13 NOTE — Progress Notes (Signed)
Nutrition Follow-up/consult RD consulted for management of TF  Intervention:   1. Change TF regimen to Osmolite 1.5, initiate at 20 ml/hr and advance by 10 ml q 4 hr to a goal rate of 40 ml/hr. 30 ml Pro-stat TID. This EN regimen will provide 1740 kcal, 105 gm protein, and 731 ml free water.  2. Multivitamin daily per tube.  3. RD will continue to follow    Assessment:   Pt now s/p hypothermia protocol, following commands.  12/5 CT abd and pelvis- resolution of small bowel dilatation and no evidence of abscess.  Now with small PE.  Continues to need low dose pressors. Remains intubated.  MD placed TF orders. Will keep with low volume with concern for increased effusions/edema.   Diet Order:  NPO Patient has OG in place with tip of tube in stomach per CXR. No TF currently running as waiting for it to arrive to unit. Goal rate is  40 ml/hr, this would provide 1152 kcal, 53 gm protein, and 787 ml free water.    Meds: Scheduled Meds:   . antiseptic oral rinse  1 application Mouth Rinse QID  . aspirin  81 mg Oral Daily  . chlorhexidine  15 mL Mouth/Throat BID  . feeding supplement (OSMOLITE 1.2 CAL)  1,000 mL Per Tube Q24H  . hydrocortisone sodium succinate  50 mg Intravenous Q6H  . insulin aspart  0-9 Units Subcutaneous Q4H  . [COMPLETED] iohexol  20 mL Oral Q1 Hr x 2  . [COMPLETED] magnesium sulfate 1 - 4 g bolus IVPB  2 g Intravenous Once  . [COMPLETED] micafungin (MYCAMINE) IV  100 mg Intravenous Daily  . [COMPLETED] midazolam      . pantoprazole (PROTONIX) IV  40 mg Intravenous QHS  . [COMPLETED] perflutren lipid microspheres (DEFINITY) IV suspension      . piperacillin-tazobactam (ZOSYN)  IV  3.375 g Intravenous Q8H  . sodium chloride  1,000 mL Intravenous Once  . [COMPLETED] sodium chloride  1,000 mL Intravenous Once  . [COMPLETED] sodium chloride  500 mL Intravenous Once  . Ticagrelor  90 mg Oral BID  . vancomycin  500 mg Intravenous Q12H  . [DISCONTINUED] artificial tears   1 application Both Eyes Q8H  . [DISCONTINUED] micafungin (MYCAMINE) IV  100 mg Intravenous Daily  . [DISCONTINUED] vancomycin  500 mg Intravenous Q12H   Continuous Infusions:   . sodium chloride 10 mL/hr (05/11/12 0024)  . sodium chloride 10 mL/hr (05/10/12 2100)  . heparin 1,000 Units/hr (05/13/12 0610)  . norepinephrine (LEVOPHED) Adult infusion 10 mcg/min (05/13/12 1000)  . sodium chloride 0.45 % with sodium bicarbonate 50 mL/hr at 05/13/12 1007  . vasopressin (PITRESSIN) infusion - *FOR SHOCK* 0.03 Units/min (05/12/12 1600)  . [DISCONTINUED] sodium chloride    . [DISCONTINUED] fentaNYL infusion INTRAVENOUS Stopped (05/12/12 0956)  . [DISCONTINUED] midazolam (VERSED) infusion Stopped (05/12/12 0955)   PRN Meds:.acetaminophen, fentaNYL, [COMPLETED] iohexol, ondansetron (ZOFRAN) IV, [DISCONTINUED] fentaNYL, [DISCONTINUED] midazolam, [DISCONTINUED] midazolam, [DISCONTINUED] midazolam   CMP     Component Value Date/Time   NA 139 05/13/2012 0506   K 3.9 05/13/2012 0506   CL 111 05/13/2012 0506   CO2 17* 05/13/2012 0506   GLUCOSE 131* 05/13/2012 0506   BUN 18 05/13/2012 0506   CREATININE 1.01 05/13/2012 0506   CALCIUM 6.6* 05/13/2012 0506   PROT 4.5* 05/13/2012 0506   ALBUMIN 1.9* 05/13/2012 0506   AST 111* 05/13/2012 0506   ALT 50 05/13/2012 0506   ALKPHOS 68 05/13/2012 0506   BILITOT  0.2* 05/13/2012 0506   GFRNONAA 70* 05/13/2012 0506   GFRAA 81* 05/13/2012 0506    CBG (last 3)   Basename 05/13/12 0735 05/13/12 0338 05/12/12 2355  GLUCAP 115* 107* 111*     Intake/Output Summary (Last 24 hours) at 05/13/12 1044 Last data filed at 05/13/12 1000  Gross per 24 hour  Intake 3837.51 ml  Output    815 ml  Net 3022.51 ml   Patient is currently intubated on ventilator support.  MV: 12.5 Temp:Temp (24hrs), Avg:99 F (37.2 C), Min:97.6 F (36.4 C), Max:100.3 F (37.9 C)  Propofol: none   Weight Status:  142 lbs, trending up   Re-estimated needs:  1727 kcal, 88-100 gm protein    Nutrition Dx:  Inadequate oral intake r/t inability to eat AEB NPO  Goal:  Initiation of EN once pt is rewarmed.  --met New Goal: Meet >/=90% estimated nutrition needs with EN.   Monitor:  EN initiation/tolerance, weight, vent status, labs   Clarene Duke RD, LDN Pager [phone] After Hours pager [phone]

## 2012-05-13 NOTE — Progress Notes (Signed)
PULMONARY  / CRITICAL CARE MEDICINE  Name: Louis Reid MRN: [id number] DOB: 1933/03/08    LOS: 3  REFERRING MD:  PMD  CHIEF COMPLAINT:  Cardiac arrest  BRIEF PATIENT DESCRIPTION:  78 yo with CAD, congenital pectus excavatum and recent admission for SBO / lysis of adhesions brought to [facility] ED after suffering VF cardiac arrest and ROSC after brief CPR and shocks x 3.  Hypothermia protocol started. Cathed. Pe on CT small 12/5  LINES / TUBES: 12/3  OETT >>> 12/3  OGT >>> 12/3  Foley >>> 12/3  L [place] CVL >>>  CULTURES: 12/5 BC>>> 12/5 sputum>>>  ANTIBIOTICS: vanc 12/5>>> Zosyn 12/5>>> myco 12/5>>>12/6  SIGNIFICANT EVENTS:  12/3  Bought to [facility] ED after suffering VF cardiac arrest and ROSC after brief CPR and shocks x 3.  Hypothermia protocol started.   12/3- cath- PTCA (balloon angioplasty) only proximal/mid LAD Thrombectomy occluded LAD 12/4- hypothermia continuous, oozing mouth, urine 12/5-acidosis, bleeding resolved, shock worse 12/5 CT chest - lingula PE, small efusions, Ct head - neg acute 12/5 CT abndo/eplvis - Distended gallbladder, containing sludge versus vicariously<BR>excreted contrast.<BR>3. Resolution of small bowel dilatation since prior exam. No<BR>evidence of abscess 12/6- follows commands on rewarm  LEVEL OF CARE:  ICU  PRIMARY SERVICE:  PCCM  CONSULTANTS:  Cardiology  CODE STATUS:  Full  DIET:  TF none  DVT Px:  Heparin off 12/4 bleeding  GI Px:  Protonix  INTERVAL HISTORY: Woke up post rewarm, remains pressors, CT small PE, hep started  VITAL SIGNS: Temp:  [97 F (36.1 C)-100.3 F (37.9 C)] 99.3 F (37.4 C) (12/06 0400) Pulse Rate:  [80-101] 81  (12/06 0600) Resp:  [16-24] 24  (12/06 0600) BP: (82-137)/(49-76) 101/55 mmHg (12/06 0437) SpO2:  [98 %-100 %] 100 % (12/06 0600) Arterial Line BP: (85-167)/(48-72) 161/72 mmHg (12/06 0600) FiO2 (%):  [30 %] 30 % (12/06 0437) Weight:  [64.6 kg (142 lb 6.7 oz)] 64.6 kg (142 lb 6.7 oz) (12/06  0500)  HEMODYNAMICS: CVP:  [3 mmHg-7 mmHg] 5 mmHg  VENTILATOR SETTINGS: Vent Mode:  [-] PRVC FiO2 (%):  [30 %] 30 % Set Rate:  [16 bmp-24 bmp] 24 bmp Vt Set:  [500 mL] 500 mL PEEP:  [5 cmH20] 5 cmH20 Plateau Pressure:  [14 cmH20-17 cmH20] 14 cmH20 INTAKE / OUTPUT: Intake/Output      12/05 0701 - 12/06 0700   I.V. (mL/kg) 2126.1 (32.9)   NG/GT 730   IV Piggyback 487.5   Total Intake(mL/kg) 3343.6 (51.8)   Urine (mL/kg/hr) 780 (0.5)   Total Output 780   Net +2563.6        PHYSICAL EXAMINATION: General:  Appears acutely ill Neuro:  rass -1, follows simple commands HEENT:  PERRL sluggish 2 mm Cardiovascular:  RRR, no m/r/g, L [place] CVL clean Lungs:  Reduced, pectus excavatum Abdomen:  Soft, nontender, surgical incision C/D/I, hypoactive Musculoskeletal:  Moves all extremities, no edema Skin:  Intact  LABS:  Lab 05/13/12 0506 05/13/12 0500 05/12/12 2220 05/12/12 1345 05/12/12 1200 05/12/12 1000 05/12/12 0922 05/12/12 0731 05/12/12 0634 05/12/12 0438 05/12/12 0411 05/12/12 0357 05/11/12 0617 05/11/12 0613 05/11/12 0500 05/11/12 0206 05/11/12 05/10/12 1837 05/10/12 1835  HGB 10.3* -- -- -- -- -- -- -- 13.3 -- 12.4* -- -- -- -- -- -- -- --  WBC 13.6* -- -- -- -- -- -- -- -- -- 22.4* -- -- -- 28.6* -- -- -- --  PLT 313 -- -- -- -- -- -- -- -- -- 395 -- -- --  362 -- -- -- --  NA 139 -- 136 -- -- -- 139 -- -- -- -- -- -- -- -- -- -- -- --  K 3.9 -- 4.0 -- -- -- -- -- -- -- -- -- -- -- -- -- -- -- --  CL 111 -- 108 -- -- -- 112 -- -- -- -- -- -- -- -- -- -- -- --  CO2 17* -- 16* -- -- -- 14* -- -- -- -- -- -- -- -- -- -- -- --  GLUCOSE 131* -- 130* -- -- -- 159* -- -- -- -- -- -- -- -- -- -- -- --  BUN 18 -- 19 -- -- -- 19 -- -- -- -- -- -- -- -- -- -- -- --  CREATININE 1.01 -- 1.02 -- -- -- 1.05 -- -- -- -- -- -- -- -- -- -- -- --  CALCIUM 6.6* -- 6.7* -- -- -- 6.6* -- -- -- -- -- -- -- -- -- -- -- --  MG 2.1 -- -- 2.3 -- -- -- -- -- -- -- 1.4* -- -- -- -- -- -- --  PHOS -- -- --  -- -- -- -- -- -- -- -- -- -- -- -- -- 1.8* -- 4.5  AST 111* -- -- -- -- -- -- -- -- -- 292* -- -- -- -- -- -- -- 46*  ALT 50 -- -- -- -- -- -- -- -- -- 78* -- -- -- -- -- -- -- 23  ALKPHOS 68 -- -- -- -- -- -- -- -- -- 69 -- -- -- -- -- -- -- 67  BILITOT 0.2* -- -- -- -- -- -- -- -- -- 0.2* -- -- -- -- -- -- -- 0.2*  PROT 4.5* -- -- -- -- -- -- -- -- -- 5.1* -- -- -- -- -- -- -- 5.3*  ALBUMIN 1.9* -- -- -- -- -- -- -- -- -- 2.3* -- -- -- -- -- -- -- 2.7*  APTT -- -- -- -- -- -- -- -- -- -- -- -- -- -- -- 55* -- -- 31  INR -- -- -- -- -- -- -- -- -- -- -- -- -- -- -- 1.48 -- -- 1.29  LATICACIDVEN -- -- -- -- -- 2.0 -- -- -- 1.7 -- -- -- 1.5 -- -- -- -- --  TROPONINI -- -- -- -- -- -- -- -- -- -- -- -- >20.00* -- -- -- >20.00* 4.43* --  PROCALCITON -- -- -- -- -- -- -- -- -- -- -- -- -- -- -- -- -- -- --  PROBNP -- -- -- -- -- -- -- -- -- -- -- -- -- -- -- -- -- -- --  O2SATVEN -- -- -- -- -- -- -- -- -- -- -- -- -- -- -- -- -- -- --  PHART -- 7.419 -- -- 7.206* -- -- 7.259* -- -- -- -- -- -- -- -- -- -- --  PCO2ART -- 26.4* -- -- 35.2 -- -- 34.6* -- -- -- -- -- -- -- -- -- -- --  PO2ART -- 129.0* -- -- 116.0* -- -- 107.0* -- -- -- -- -- -- -- -- -- -- --    Lab 05/13/12 0338 05/12/12 1558 05/12/12 1201 05/12/12 0815 05/12/12 0327  GLUCAP 107* 148* 90 117* 135*   IMAGING:  pcxr - unchanged bilateral patchy infiltrates, ett wnl  ECG:  Sinus tachycardia on monitor  DIAGNOSES: Active Problems:  HYPERLIPIDEMIA  HYPERTENSION  Asymptomatic old MI (myocardial infarction)  Coronary artery disease  History of coronary artery bypass surgery  Ventricular fibrillation  Cardiac arrest  Acute respiratory failure  Acute encephalopathy  Hyperglycemia  ASSESSMENT / PLAN:  PULMONARY  A:  Acute respiratory failure.  Congenital pectus excavatum, hypothermia, uncompensated met acidosis Some restrictive dz, anatomy, PE small lingula (post op) P:   continue bicarb for NON AG at 50 Avoiding  Nacl pcx rin am  Even  Balance goal abg reviewed, rate to 18 On low dose pressors would favor starting weaning cpap 5 ps 5  CARDIOVASCULAR  A: VF cardiac arrest. CAD. Dyslipidemia, int VT continues, s/p Angioplasty lad, small PE P:  Goal MAP>60 as rewarm Levophed gtt for above new map Asa Off vaso cvp 7, again could consider bolus, but pcxr with some increase effusions / edema?  RENAL  A:  NON AG  And small AG after albumin correction P:  Chem in am  Likely to dc bicarb in am  Remain fluids at 50   GASTROINTESTINAL  A:  Recent SBO / lysis of adhesions P:   Start T F Ct reviewed ppi  HEMATOLOGIC  A:  Anemia, leukocytosis, PE lingula small P:  scd Heparin drip coags in am   INFECTIOUS  A:  Leukocytosis improved, s/p sbo P:   Continue zosyn, vanc, narrow in am  Dc myco CT abdo/pelvis reviewed Improved clinically  ENDOCRINE   A:  Hyperglycemia. Controlled, Likely sick euthyroid P:   ICU Glycemic Control Protocol Phase 1 t3  Low, this is likely sick euthyroid  NEUROLOGIC  A:  Acute encephalopathy. Follows simple commands P:   Int sedation now Dc versed Use fent  CLINICAL SUMMARY:  78 yo with congenital pectus excavatum and recent admission for SBO / lysis of adhesions brought to [facility] ED after suffering VF cardiac arrest and ROSC after brief CPR and shocks x 3.  Hypothermia protocol. PE noted on CT small, now awake, weaning, remains on pressors,  May need further bolus, cvp low  I have personally obtained a history, examined the patient, evaluated laboratory and imaging results, formulated the assessment and plan and placed orders.  CRITICAL CARE:  The patient is critically ill with multiple organ systems failure and requires high complexity decision making for assessment and support, frequent evaluation and titration of therapies, application of advanced monitoring technologies and extensive interpretation of multiple databases. Critical Care Time devoted  to patient care services described in this note is 30 minutes.   Nelda Bucks., MD  Pulmonary and Critical Care Medicine [facility] Pager: [phone]  05/13/2012, 6:59 AM

## 2012-05-13 NOTE — Progress Notes (Signed)
R fem sheath pulled, pressure held x 20 min hemostasis achieved, site level 0, faint pulse palpated distally, pt tolerated well vital sign stable

## 2012-05-13 NOTE — Progress Notes (Signed)
ANTICOAGULATION CONSULT NOTE - Follow Up Consult  Pharmacy Consult for Heparin Indication: pulmonary embolus  Allergies  Allergen Reactions  . Cold Medicine Plus (Chlorphen-Pseudoephed-Apap)     COLD MEDICATIONS MAKE HIM "LOOPY"    Patient Measurements: Height: 5\' 9"  (175.3 cm) Weight: 142 lb 6.7 oz (64.6 kg) IBW/kg (Calculated) : 70.7  Heparin Dosing Weight: 65kg  Vital Signs: Temp: 97.7 F (36.5 C) (12/06 2000) Temp src: Oral (12/06 2000) BP: 138/72 mmHg (12/06 2004) Pulse Rate: 95  (12/06 2045)  Labs:  Alvira Philips 05/13/12 2016 05/13/12 1115 05/13/12 0922 05/13/12 0506 05/12/12 2220 05/12/12 0634 05/12/12 0411 05/11/12 0617 05/11/12 0206 05/11/12  HGB -- 9.9* -- 10.3* -- -- -- -- -- --  HCT -- 30.0* -- 30.7* -- 39.0 -- -- -- --  PLT -- 278 -- 313 -- -- 395 -- -- --  APTT -- -- -- -- -- -- -- -- 55* --  LABPROT -- -- -- -- -- -- -- -- 17.5* --  INR -- -- -- -- -- -- -- -- 1.48 --  HEPARINUNFRC 0.58 0.60 -- 0.26* -- -- -- -- -- --  CREATININE -- -- 0.95 1.01 1.02 -- -- -- -- --  CKTOTAL -- -- -- -- -- -- -- -- -- --  CKMB -- -- -- -- -- -- -- -- -- --  TROPONINI -- -- -- -- -- -- -- >20.00* -- >20.00*    Estimated Creatinine Clearance: 59.5 ml/min (by C-G formula based on Cr of 0.95).   Medications:  Heparin at 1000 units/hr  Assessment: 78 y.o. M on heparin for a new PE (lingula, tiny) seen on CT scan 12/5. Heparin level this evening remains therapeutic (HL 0.58 << 0.6, goal of 0.3-0.7). Since this is the 2nd therapeutic heparin level will adjust to q24h heparin level monitoring in the a.m with morning labs (unless rate adjustments needed). Hgb/Hct/Plt slight drop. No blood noted in urine and bleeding from mouth noted to be "old blood" per nurse report. No new s/sx of bleeding noted at this time.   Goal of Therapy:  Heparin level 0.3-0.7 units/ml Monitor platelets by anticoagulation protocol: Yes   Plan:  1) Continue heparin at 1000 units/hr 2) Will continue to  monitor for any signs/symptoms of bleeding and will follow up with heparin level in the a.m.   Georgina Pillion, PharmD, BCPS Clinical Pharmacist Pager: [phone] 05/13/2012 9:12 PM

## 2012-05-13 NOTE — Progress Notes (Signed)
CRITICAL VALUE ALERT  Critical value received:  Ca 5.4  Date of notification:  05/13/2012  Time of notification:  2205  Critical value read back:yes  Nurse who received alert:  Akil Hoos, Lytle Butte  MD notified (1st page):  Dr Deterding  Time of first page:  2205  Orders received and carried out

## 2012-05-13 NOTE — Progress Notes (Signed)
eLink Physician-Brief Progress Note Patient Name: Louis Reid DOB: December 14, 1932 MRN: [id number]  Date of Service  05/13/2012   HPI/Events of Note  Calcium level of 5.4.  Corrected for albumin of 1.9 is 7.1.  eICU Interventions  Plan: 1 gm calcium gluconate IV   Intervention Category Intermediate Interventions: Electrolyte abnormality - evaluation and management  DETERDING,ELIZABETH 05/13/2012, 10:13 PM

## 2012-05-13 NOTE — Progress Notes (Signed)
Subjective:  This am the patient is awake on vent.  Follows commands. Denies chest pain or dyspnea.  Objective:  Vital Signs in the last 24 hours: Temp:  [97.5 F (36.4 C)-100.3 F (37.9 C)] 99.3 F (37.4 C) (12/06 0400) Pulse Rate:  [81-101] 96  (12/06 0700) Resp:  [16-24] 21  (12/06 0700) BP: (82-137)/(49-76) 101/55 mmHg (12/06 0437) SpO2:  [98 %-100 %] 100 % (12/06 0700) Arterial Line BP: (82-167)/(48-72) 82/48 mmHg (12/06 0700) FiO2 (%):  [30 %] 30 % (12/06 0437) Weight:  [142 lb 6.7 oz (64.6 kg)] 142 lb 6.7 oz (64.6 kg) (12/06 0500)  Intake/Output from previous day: 12/05 0701 - 12/06 0700 In: 3465.8 [I.V.:2248.3; NG/GT:730; IV Piggyback:487.5] Out: 780 [Urine:780] Intake/Output from this shift:       . antiseptic oral rinse  1 application Mouth Rinse QID  . aspirin  81 mg Oral Daily  . chlorhexidine  15 mL Mouth/Throat BID  . feeding supplement (OSMOLITE 1.2 CAL)  1,000 mL Per Tube Q24H  . hydrocortisone sodium succinate  50 mg Intravenous Q6H  . insulin aspart  0-9 Units Subcutaneous Q4H  . [COMPLETED] iohexol  20 mL Oral Q1 Hr x 2  . [COMPLETED] magnesium sulfate 1 - 4 g bolus IVPB  2 g Intravenous Once  . [COMPLETED] micafungin (MYCAMINE) IV  100 mg Intravenous Daily  . [COMPLETED] midazolam      . pantoprazole (PROTONIX) IV  40 mg Intravenous QHS  . [COMPLETED] perflutren lipid microspheres (DEFINITY) IV suspension      . piperacillin-tazobactam (ZOSYN)  IV  3.375 g Intravenous Q8H  . sodium chloride  1,000 mL Intravenous Once  . [COMPLETED] sodium chloride  1,000 mL Intravenous Once  . sodium chloride  500 mL Intravenous Once  . Ticagrelor  90 mg Oral BID  . vancomycin  500 mg Intravenous Q12H  . [DISCONTINUED] artificial tears  1 application Both Eyes Q8H  . [DISCONTINUED] magnesium sulfate LVP 250-500 ml  2 g Intravenous Once  . [DISCONTINUED] micafungin (MYCAMINE) IV  100 mg Intravenous Daily  . [DISCONTINUED] sertraline  50 mg Oral Daily  .  [DISCONTINUED] simvastatin  40 mg Oral q1800  . [DISCONTINUED] vancomycin  500 mg Intravenous Q12H      . sodium chloride 10 mL/hr (05/11/12 0024)  . sodium chloride 10 mL/hr (05/10/12 2100)  . heparin 1,000 Units/hr (05/13/12 0610)  . norepinephrine (LEVOPHED) Adult infusion 12 mcg/min (05/13/12 0700)  . sodium chloride 0.45 % with sodium bicarbonate 50 mL/hr at 05/12/12 1226  . vasopressin (PITRESSIN) infusion - *FOR SHOCK* 0.03 Units/min (05/12/12 1600)  . [DISCONTINUED] sodium chloride    . [DISCONTINUED] sodium chloride 10 mL/hr (05/10/12 2100)  . [DISCONTINUED] cisatracurium (NIMBEX) infusion Stopped (05/12/12 0650)  . [DISCONTINUED] fentaNYL infusion INTRAVENOUS Stopped (05/12/12 0956)  . [DISCONTINUED] midazolam (VERSED) infusion Stopped (05/12/12 0955)    Physical Exam: The patient appears to be in no distress. Intubated, awake on vent.  Head and neck exam reveals that the pupils are equal and reactive.  The extraocular movements are full.  There is no scleral icterus.  Mouth and pharynx are benign.  No lymphadenopathy.  No carotid bruits.  The jugular venous pressure is normal.  Thyroid is not enlarged or tender.  Chest is clear anteriorly.  Heart reveals no abnormal lift or heave.  First and second heart sounds are normal.  There is no murmur gallop rub or click. Heart tones are distant. Old marked pectus excavatum.  The abdomen is soft  and nontender.  Bowel sounds are decreased.  There is no hepatosplenomegaly or mass.  There are no abdominal bruits.  Extremities reveal trace ankle edema.  Weak pedal pulses.  Neurologic exam: Follows commands. Grip weak but present.  Integument reveals no rash  Lab Results:  Basename 05/13/12 0506 05/12/12 0634 05/12/12 0411  WBC 13.6* -- 22.4*  HGB 10.3* 13.3 --  PLT 313 -- 395    Basename 05/13/12 0506 05/12/12 2220  NA 139 136  K 3.9 4.0  CL 111 108  CO2 17* 16*  GLUCOSE 131* 130*  BUN 18 19  CREATININE 1.01 1.02     Basename 05/11/12 0617 05/11/12  TROPONINI >20.00* >20.00*   Hepatic Function Panel  Basename 05/13/12 0506  PROT 4.5*  ALBUMIN 1.9*  AST 111*  ALT 50  ALKPHOS 68  BILITOT 0.2*  BILIDIR --  IBILI --   No results found for this basename: CHOL in the last 72 hours No results found for this basename: PROTIME in the last 72 hours  Imaging: Imaging results have been reviewed  Cardiac Studies: Telemetry shows NSR. Assessment/Plan:  1. VF Cardiac Arrest  2. Acute Anterior STEMI s/p balloon angioplasty and thrombectomy of occluded LAD  3. Coronary Artery Disease w/ h/o 5v CABG  4. Acute Respiratory Failure  5. Acute Encephalopathy  6. Ventricular Tachycardia resolved 7. Atrial Fibrillation resolved 8. Hypotension, improving. Levophed being tapered.  9. Anemia w/ oozing on anticoagulants, improving.  On Brilinta and IV heparin.  Plan: Continue supportive care, wean pressors per CCM.          EKG today shows NSR with RBBB.  Anterior ST elevation is improving.     LOS: 3 days    Cassell Clement 05/13/2012, 7:41 AM

## 2012-05-13 NOTE — Progress Notes (Signed)
ANTICOAGULATION CONSULT NOTE - Follow Up Consult  Pharmacy Consult for heparin Indication: pulmonary embolus  Labs:  Basename 05/13/12 0506 05/12/12 2220 05/12/12 0922 05/12/12 0634 05/12/12 0411 05/11/12 0617 05/11/12 0500 05/11/12 0206 05/11/12 05/10/12 1837 05/10/12 1835  HGB 10.3* -- -- 13.3 -- -- -- -- -- -- --  HCT 30.7* -- -- 39.0 38.9* -- -- -- -- -- --  PLT 313 -- -- -- 395 -- 362 -- -- -- --  APTT -- -- -- -- -- -- -- 55* -- -- 31  LABPROT -- -- -- -- -- -- -- 17.5* -- -- 15.8*  INR -- -- -- -- -- -- -- 1.48 -- -- 1.29  HEPARINUNFRC 0.26* -- -- -- -- -- -- -- -- -- --  CREATININE -- 1.02 1.05 0.90 -- -- -- -- -- -- --  CKTOTAL -- -- -- -- -- -- -- -- -- -- --  CKMB -- -- -- -- -- -- -- -- -- -- --  TROPONINI -- -- -- -- -- >20.00* -- -- >20.00* 4.43* --    Assessment: 78yo male subtherapeutic on heparin with initial dosing for PE in setting of anemia; per RN bloody drainage continues from mouth though no longer bleeding from other sites and no further outward signs of bleeding, H/H has dropped though being followed by CCM.  Goal of Therapy:  Heparin level 0.3-0.7 units/ml   Plan:  Will increase heparin cautiously to 1000 units/hr and check level and CBC in 6hr.  Colleen Can PharmD BCPS 05/13/2012,5:51 AM

## 2012-05-13 NOTE — Progress Notes (Signed)
ANTICOAGULATION CONSULT NOTE - Follow Up Consult  Pharmacy Consult for Heparin Indication: pulmonary embolus  Allergies  Allergen Reactions  . Cold Medicine Plus (Chlorphen-Pseudoephed-Apap)     COLD MEDICATIONS MAKE HIM "LOOPY"    Patient Measurements: Height: 5\' 9"  (175.3 cm) Weight: 142 lb 6.7 oz (64.6 kg) IBW/kg (Calculated) : 70.7  Heparin Dosing Weight: 65kg  Vital Signs: Temp: 99.5 F (37.5 C) (12/06 1200) Temp src: Oral (12/06 1200) BP: 101/55 mmHg (12/06 0437) Pulse Rate: 95  (12/06 1200)  Labs:  Basename 05/13/12 1115 05/13/12 0506 05/12/12 2220 05/12/12 0922 05/12/12 0634 05/12/12 0411 05/11/12 0617 05/11/12 0206 05/11/12 05/10/12 1837 05/10/12 1835  HGB 9.9* 10.3* -- -- -- -- -- -- -- -- --  HCT 30.0* 30.7* -- -- 39.0 -- -- -- -- -- --  PLT 278 313 -- -- -- 395 -- -- -- -- --  APTT -- -- -- -- -- -- -- 55* -- -- 31  LABPROT -- -- -- -- -- -- -- 17.5* -- -- 15.8*  INR -- -- -- -- -- -- -- 1.48 -- -- 1.29  HEPARINUNFRC 0.60 0.26* -- -- -- -- -- -- -- -- --  CREATININE -- 1.01 1.02 1.05 -- -- -- -- -- -- --  CKTOTAL -- -- -- -- -- -- -- -- -- -- --  CKMB -- -- -- -- -- -- -- -- -- -- --  TROPONINI -- -- -- -- -- -- >20.00* -- >20.00* 4.43* --    Estimated Creatinine Clearance: 56 ml/min (by C-G formula based on Cr of 1.01).   Medications:  Heparin at 1000 units/hr  Assessment: 79yom continues on heparin for new PE (lingula, tiny) seen on CT scan 12/5. Heparin level is therapeutic after rate increase this morning. Hgb/Hct continue to trend down, platelets slowly decreasing as well. Bleeding from mouth improving. No blood seen in foley bag.   Goal of Therapy:  Heparin level 0.3-0.7 units/ml Monitor platelets by anticoagulation protocol: Yes   Plan:  1) Continue heparin at 1000 units/hr 2) 8 hour heparin level to confirm  Fredrik Rigger 05/13/2012,12:23 PM

## 2012-05-13 NOTE — Progress Notes (Signed)
50 cc of Fentanyl wasted from 250 cc bag.  Witnessed per two RN's. Ernestene Kiel RN and Conrad [place] RN

## 2012-05-14 ENCOUNTER — Inpatient Hospital Stay (HOSPITAL_COMMUNITY): Payer: Medicare Other

## 2012-05-14 LAB — BLOOD GAS, ARTERIAL
Acid-base deficit: 5.4 mmol/L — ABNORMAL HIGH (ref 0.0–2.0)
Drawn by: 28701
FIO2: 0.3 %
MECHVT: 500 mL
O2 Saturation: 98.3 %
RATE: 18 resp/min
TCO2: 19.3 mmol/L (ref 0–100)
pCO2 arterial: 29.6 mmHg — ABNORMAL LOW (ref 35.0–45.0)
pO2, Arterial: 114 mmHg — ABNORMAL HIGH (ref 80.0–100.0)

## 2012-05-14 LAB — GLUCOSE, CAPILLARY
Glucose-Capillary: 160 mg/dL — ABNORMAL HIGH (ref 70–99)
Glucose-Capillary: 187 mg/dL — ABNORMAL HIGH (ref 70–99)
Glucose-Capillary: 188 mg/dL — ABNORMAL HIGH (ref 70–99)
Glucose-Capillary: 156 mg/dL — ABNORMAL HIGH (ref 70–99)
Glucose-Capillary: 159 mg/dL — ABNORMAL HIGH (ref 70–99)

## 2012-05-14 LAB — COMPREHENSIVE METABOLIC PANEL
Alkaline Phosphatase: 70 U/L (ref 39–117)
BUN: 25 mg/dL — ABNORMAL HIGH (ref 6–23)
CO2: 19 mEq/L (ref 19–32)
Chloride: 111 mEq/L (ref 96–112)
Creatinine, Ser: 0.95 mg/dL (ref 0.50–1.35)
GFR calc non Af Amer: 78 mL/min — ABNORMAL LOW (ref >90)
Glucose, Bld: 197 mg/dL — ABNORMAL HIGH (ref 70–99)
Potassium: 4.2 mEq/L (ref 3.5–5.1)
Total Bilirubin: 0.3 mg/dL (ref 0.3–1.2)

## 2012-05-14 LAB — CBC WITH DIFFERENTIAL/PLATELET
Basophils Absolute: 0 10*3/uL (ref 0.0–0.1)
Eosinophils Absolute: 0 10*3/uL (ref 0.0–0.7)
Eosinophils Relative: 0 % (ref 0–5)
Lymphocytes Relative: 2 % — ABNORMAL LOW (ref 12–46)
Lymphs Abs: 0.4 10*3/uL — ABNORMAL LOW (ref 0.7–4.0)
Neutrophils Relative %: 93 % — ABNORMAL HIGH (ref 43–77)
Platelets: 322 10*3/uL (ref 150–400)
RBC: 3.35 MIL/uL — ABNORMAL LOW (ref 4.22–5.81)
RDW: 14.5 % (ref 11.5–15.5)
WBC: 18.2 10*3/uL — ABNORMAL HIGH (ref 4.0–10.5)

## 2012-05-14 MED ORDER — FUROSEMIDE 10 MG/ML IJ SOLN
40.0000 mg | Freq: Two times a day (BID) | INTRAMUSCULAR | Status: DC
  Administered 2012-05-14 – 2012-05-15 (×3): 40 mg via INTRAVENOUS
  Filled 2012-05-14 (×3): qty 4

## 2012-05-14 MED ORDER — FUROSEMIDE 10 MG/ML IJ SOLN
INTRAMUSCULAR | Status: AC
  Administered 2012-05-14: 40 mg via INTRAVENOUS
  Filled 2012-05-14: qty 4

## 2012-05-14 MED ORDER — ATORVASTATIN CALCIUM 80 MG PO TABS
80.0000 mg | ORAL_TABLET | Freq: Every day | ORAL | Status: DC
  Administered 2012-05-14 – 2012-05-19 (×5): 80 mg
  Filled 2012-05-14 (×7): qty 1

## 2012-05-14 NOTE — Progress Notes (Signed)
Patient ID: KAIDAN HARPSTER, male   DOB: [birth year], 78 y.o.   MRN: [id number]    SUBJECTIVE: Patient awake on vent this morning, following commands.  Titrating down on norepi, now at 6.       Marland Kitchen antiseptic oral rinse  1 application Mouth Rinse QID  . aspirin  81 mg Oral Daily  . [COMPLETED] calcium gluconate  1 g Intravenous Once  . chlorhexidine  15 mL Mouth/Throat BID  . feeding supplement (OSMOLITE 1.5 CAL)  1,000 mL Per Tube Q24H  . feeding supplement  30 mL Per Tube TID  . insulin aspart  0-9 Units Subcutaneous Q4H  . multivitamin  5 mL Per Tube Daily  . pantoprazole (PROTONIX) IV  40 mg Intravenous QHS  . piperacillin-tazobactam (ZOSYN)  IV  3.375 g Intravenous Q8H  . [COMPLETED] potassium chloride  40 mEq Per Tube Once  . [COMPLETED] potassium chloride  60 mEq Per Tube Once  . sodium chloride  1,000 mL Intravenous Once  . Ticagrelor  90 mg Oral BID  . vancomycin  500 mg Intravenous Q12H  . [DISCONTINUED] antiseptic oral rinse  15 mL Mouth Rinse QID  . [DISCONTINUED] chlorhexidine  15 mL Mouth/Throat BID  . [DISCONTINUED] chlorhexidine  15 mL Mouth Rinse BID  . [DISCONTINUED] feeding supplement (OSMOLITE 1.2 CAL)  1,000 mL Per Tube Q24H  . [DISCONTINUED] hydrocortisone sodium succinate  50 mg Intravenous Q6H  norepinephrine @ 6 Heparin gtt   Filed Vitals:   05/14/12 0400 05/14/12 0500 05/14/12 0600 05/14/12 0745  BP: 104/67   119/65  Pulse: 96 94 78 100  Temp: 97.8 F (36.6 C)   97.8 F (36.6 C)  TempSrc: Oral   Oral  Resp: 18 17 18 18   Height:      Weight: 150 lb 2.1 oz (68.1 kg)     SpO2: 99% 99% 98% 99%    Intake/Output Summary (Last 24 hours) at 05/14/12 0920 Last data filed at 05/14/12 0600  Gross per 24 hour  Intake 2911.65 ml  Output    515 ml  Net 2396.65 ml    LABS: Basic Metabolic Panel:  Basename 05/14/12 0500 05/13/12 2111 05/13/12 0506 05/12/12 1345  NA 139 140 -- --  K 4.2 3.2* -- --  CL 111 115* -- --  CO2 19 16* -- --  GLUCOSE 197* 144*  -- --  BUN 25* 19 -- --  CREATININE 0.95 0.77 -- --  CALCIUM 7.2* 5.4* -- --  MG -- -- 2.1 2.3  PHOS -- -- -- --   Liver Function Tests:  Basename 05/14/12 0500 05/13/12 0506  AST 60* 111*  ALT 39 50  ALKPHOS 70 68  BILITOT 0.3 0.2*  PROT 4.8* 4.5*  ALBUMIN 1.9* 1.9*   No results found for this basename: LIPASE:2,AMYLASE:2 in the last 72 hours CBC:  Basename 05/14/12 0500 05/13/12 1115 05/13/12 0506  WBC 18.2* 14.6* --  NEUTROABS 17.0* -- 12.1*  HGB 10.4* 9.9* --  HCT 31.4* 30.0* --  MCV 93.7 93.5 --  PLT 322 278 --   Cardiac Enzymes: No results found for this basename: CKTOTAL:3,CKMB:3,CKMBINDEX:3,TROPONINI:3 in the last 72 hours BNP: No components found with this basename: POCBNP:3 D-Dimer: No results found for this basename: DDIMER:2 in the last 72 hours Hemoglobin A1C: No results found for this basename: HGBA1C in the last 72 hours Fasting Lipid Panel: No results found for this basename: CHOL,HDL,LDLCALC,TRIG,CHOLHDL,LDLDIRECT in the last 72 hours Thyroid Function Tests:  Basename 05/11/12 1017  TSH  9.927*  T4TOTAL --  T3FREE --  THYROIDAB --   Anemia Panel: No results found for this basename: VITAMINB12,FOLATE,FERRITIN,TIBC,IRON,RETICCTPCT in the last 72 hours  RADIOLOGY: Ct Angio Chest Pe W/cm &/or Wo Cm  05/12/2012  *RADIOLOGY REPORT*  Clinical Data: Pulmonary infiltrates.  Cardiac arrest.  CT ANGIOGRAPHY CHEST  Technique:  Multidetector CT imaging of the chest using the standard protocol during bolus administration of intravenous contrast. Multiplanar reconstructed images including MIPs were obtained and reviewed to evaluate the vascular anatomy.  Contrast: OMNIPAQUE IOHEXOL 300 MG/ML  SOLN  Comparison: Chest x-ray dated 05/12/2012  Findings: There is a single small pulmonary embolus and artery to the lingula of the left lung.  No other pulmonary emboli.  There are moderate bilateral pleural effusions with secondary atelectasis in the lower lobes.  The  patient has a marked pectus excavatum deformity.  Extensive coronary artery calcification. Previous CABG.  No hilar or mediastinal adenopathy.   No acute osseous abnormality.  Ascites is noted in the upper abdomen.  IMPRESSION:  1.  Tiny pulmonary embolus in the lingula.  No other emboli. 2.  Moderate bilateral pleural effusions.   Original Report Authenticated By: Francene Boyers, M.D.     Dg Chest Port 1 View  05/14/2012  *RADIOLOGY REPORT*  Clinical Data: Edema.  PORTABLE CHEST - 1 VIEW  Comparison: 05/13/2012  Findings: Support devices are unchanged. Prior CABG.  Stable cardiomegaly.  Mild bilateral airspace disease, left greater than right, with small bilateral effusions, also left greater than right.  No real change since prior study.  IMPRESSION: No significant change asymmetric airspace disease and effusions, left greater than right, likely asymmetric edema.   Original Report Authenticated By: Charlett Nose, M.D.    Dg Chest Port 1 View  05/13/2012  *RADIOLOGY REPORT*  Clinical Data: Assess endotracheal.  PORTABLE CHEST - 1 VIEW  Comparison: Portable chest x-ray of 05/12/2012 and CT angio chest of the same date  Findings: The tip of the endotracheal tube is approximately 5.0 cm above the carina.  Basilar opacities remain left greater than right consistent with atelectasis, effusion, possibly left basilar pneumonia.  A left central venous line is unchanged in position. Heart size is stable.  IMPRESSION:  1.  Tip of endotracheal tube 5.0 cm above the carina. 2.  Little change in basilar opacities left greater than right.   Original Report Authenticated By: Dwyane Dee, M.D.      PHYSICAL EXAM General: NAD Neck: JVP 10+ cm, no thyromegaly or thyroid nodule.  Lungs: Dependent crackles CV: Nondisplaced PMI.  Heart regular S1/S2, no S3/S4, no murmur.  1+ edema 1/2 to knees bilaterally. No carotid bruit.  Normal pedal pulses.  Abdomen: Soft, nontender, no hepatosplenomegaly, no distention.  Neurologic:  Alert and oriented x 3.  Psych: Normal affect. Extremities: No clubbing or cyanosis.   TELEMETRY: Reviewed telemetry pt in NSR  ASSESSMENT AND PLAN:  78 yo with history of CAD s/p CABG had recent SBO with lysis of adhesions.  Was readmitted with vfib arrest (cardioverted with ROSC) and anterior STEMI.  He was started on hypothermia protocol and had balloon angioplasty/thrombectomy to occluded LAD (had atretic LIMA).  EF 20-25% by echo.   1. CAD: S/p ballooon angioplasty/thrombectomy to occluded LAD (no stent).  LIMA known to be atretic.  He is currently on ASA 81 and Brilinta which we will continue.  Add atorvastatin 80.   2. Hypotension: Suspect cardiogenic shock with EF 20-25% post-MI.  Improving, BP better today, weaning down norepinephrine (  to 6 this am).   3. Acute systolic CHF: EF 16-10%.  Coming off norepinephrine.  Pulmonary edema on CXR and elevated JVP.  CVP only 7 but by exam appears more overloaded.  Renal function stable with poor urine output.  I will add Lasix 40 mg IV bid today.  4. Acute respiratory failure/ventilated: Pulmonary edema, small PE (post-op), congenital pectus.  - Diurese today - heparin gtt for PE - abx for ? Aspiration. 5. PE: Small, post-operative from lysis of adhesions operation.  On heparin gtt.  6. Vfib arrest: In setting of anterior MI treated with angioplasty.  Life vest at discharge with repeat echo in a month for ? ICD. Coreg when BP more stable and volume controlled.  7. Neuro: Awake, follows commands on vent.   Marca Ancona 05/14/2012 9:35 AM

## 2012-05-14 NOTE — Progress Notes (Signed)
ANTICOAGULATION CONSULT NOTE - Follow Up Consult  Pharmacy Consult for heparin and vancomycin Indication: pulmonary embolus; suspected PNA  Allergies  Allergen Reactions  . Cold Medicine Plus (Chlorphen-Pseudoephed-Apap)     COLD MEDICATIONS MAKE HIM "LOOPY"    Patient Measurements: Height: 5\' 9"  (175.3 cm) Weight: 150 lb 2.1 oz (68.1 kg) IBW/kg (Calculated) : 70.7  Heparin Dosing Weight: 65kg  Vital Signs: Temp: 97.8 F (36.6 C) (12/07 0400) Temp src: Oral (12/07 0400) BP: 104/67 mmHg (12/07 0400) Pulse Rate: 78  (12/07 0600)  Labs:  Basename 05/14/12 0500 05/13/12 2111 05/13/12 2016 05/13/12 1115 05/13/12 0922 05/13/12 0506  HGB 10.4* -- -- 9.9* -- --  HCT 31.4* -- -- 30.0* -- 30.7*  PLT 322 -- -- 278 -- 313  APTT -- -- -- -- -- --  LABPROT -- -- -- -- -- --  INR -- -- -- -- -- --  HEPARINUNFRC 0.62 -- 0.58 0.60 -- --  CREATININE 0.95 0.77 -- -- 0.95 --  CKTOTAL -- -- -- -- -- --  CKMB -- -- -- -- -- --  TROPONINI -- -- -- -- -- --    Estimated Creatinine Clearance: 62.7 ml/min (by C-G formula based on Cr of 0.95).  Assessment: Patient is a 78 y.o M on heparin for PE.  CBC is low but stable.  Heparin level is at goal this morning.  Currently on vancomycin and zosyn started on 12/04 for suspected PNA.  BCXs have been negative thus far and normal flora noted in resp culture from 12/04..  MD's note from 12/06 indicated plan for possible narrowing of abx today.  Goal of Therapy:  Heparin level 0.3-0.7 units/ml; vancomycin trough= 15-20 Monitor platelets by anticoagulation protocol: Yes   Plan:  1) no change for heparin 2) will obtain vancomycin level in the next 24-48 hrs if to cont with vancomycin.  Continue current abx regimen for now.  Ayrabella Labombard P 05/14/2012,7:32 AM

## 2012-05-14 NOTE — Progress Notes (Signed)
PULMONARY  / CRITICAL CARE MEDICINE  Name: Louis Reid MRN: [id number] DOB: [birth year]    LOS: 4  REFERRING MD:  PMD  CHIEF COMPLAINT:  Cardiac arrest  BRIEF PATIENT DESCRIPTION:  78 yo with CAD, congenital pectus excavatum and recent admission for SBO / lysis of adhesions brought to [facility] ED after suffering VF cardiac arrest and ROSC after brief CPR and shocks x 3.  Hypothermia protocol started. Cathed. Pe on CT small 12/5  LINES / TUBES: 12/3  OETT >>> 12/3  OGT >>> 12/3  Foley >>> 12/3  L [place] CVL >>>  CULTURES: 12/5 BC>>> 12/5 sputum>>>NF  ANTIBIOTICS: vanc 12/5>>> Zosyn 12/5>>> myco 12/5>>>12/6  SIGNIFICANT EVENTS:  12/3  Bought to [facility] ED after suffering VF cardiac arrest and ROSC after brief CPR and shocks x 3.  Hypothermia protocol started.   12/3- cath- PTCA (balloon angioplasty) only proximal/mid LAD Thrombectomy occluded LAD 12/4- hypothermia continuous, oozing mouth, urine 12/5-acidosis, bleeding resolved, shock worse 12/5 CT chest - lingula PE, small efusions, Ct head - neg acute 12/5 CT abndo/eplvis - Distended gallbladder, containing sludge versus vicariously<BR>excreted contrast.<BR>3. Resolution of small bowel dilatation since prior exam. No<BR>evidence of abscess 12/6- follows commands on rewarm 12/7- low dose pressor needs  LEVEL OF CARE:  ICU  PRIMARY SERVICE:  PCCM  CONSULTANTS:  Cardiology  CODE STATUS:  Full  DIET:  TF 12/6>>>  DVT Px:  Heparin drip for PE  GI Px:  Protonix  INTERVAL HISTORY: weaning  VITAL SIGNS: Temp:  [97.7 F (36.5 C)-99.5 F (37.5 C)] 97.8 F (36.6 C) (12/07 0400) Pulse Rate:  [74-100] 100  (12/07 0745) Resp:  [17-21] 18  (12/07 0745) BP: (104-138)/(64-72) 119/65 mmHg (12/07 0745) SpO2:  [98 %-100 %] 99 % (12/07 0745) Arterial Line BP: (70-148)/(43-74) 106/59 mmHg (12/07 0600) FiO2 (%):  [30 %] 30 % (12/07 0745) Weight:  [68.1 kg (150 lb 2.1 oz)] 68.1 kg (150 lb 2.1 oz) (12/07 0400)  HEMODYNAMICS: CVP:  [4  mmHg-7 mmHg] 7 mmHg  VENTILATOR SETTINGS: Vent Mode:  [-] CPAP;PSV FiO2 (%):  [30 %] 30 % Set Rate:  [18 bmp] 18 bmp Vt Set:  [500 mL] 500 mL PEEP:  [5 cmH20] 5 cmH20 Pressure Support:  [5 cmH20] 5 cmH20 Plateau Pressure:  [9 cmH20-14 cmH20] 11 cmH20 INTAKE / OUTPUT: Intake/Output      12/06 0701 - 12/07 0700 12/07 0701 - 12/08 0700   I.V. (mL/kg) 2060.8 (30.3)    NG/GT 580    IV Piggyback 972.5    Total Intake(mL/kg) 3613.3 (53.1)    Urine (mL/kg/hr) 565 (0.3)    Total Output 565    Net +3048.3          PHYSICAL EXAMINATION: General:  Appears acutely ill Neuro:  rass -2 to -1, follows simple commands HEENT:  PERRL sluggish 2 mm Cardiovascular:  RRR, no m/r/g, L [place] CVL clean Lungs:  Reduced, pectus excavatum Abdomen:  Soft, nontender, surgical incision C/D/I, hypoactive Musculoskeletal:  Moves all extremities, no edema Skin:  Intact  LABS:  Lab 05/14/12 0505 05/14/12 0500 05/13/12 2111 05/13/12 1115 05/13/12 0922 05/13/12 0506 05/13/12 0500 05/12/12 1345 05/12/12 1200 05/12/12 1000 05/12/12 0438 05/12/12 0411 05/12/12 0357 05/11/12 0617 05/11/12 0613 05/11/12 0206 05/11/12 05/10/12 1837 05/10/12 1835  HGB -- 10.4* -- 9.9* -- 10.3* -- -- -- -- -- -- -- -- -- -- -- -- --  WBC -- 18.2* -- 14.6* -- 13.6* -- -- -- -- -- -- -- -- -- -- -- -- --  PLT -- 322 -- 278 -- 313 -- -- -- -- -- -- -- -- -- -- -- -- --  NA -- 139 140 -- 138 -- -- -- -- -- -- -- -- -- -- -- -- -- --  K -- 4.2 3.2* -- -- -- -- -- -- -- -- -- -- -- -- -- -- -- --  CL -- 111 115* -- 111 -- -- -- -- -- -- -- -- -- -- -- -- -- --  CO2 -- 19 16* -- 18* -- -- -- -- -- -- -- -- -- -- -- -- -- --  GLUCOSE -- 197* 144* -- 133* -- -- -- -- -- -- -- -- -- -- -- -- -- --  BUN -- 25* 19 -- 18 -- -- -- -- -- -- -- -- -- -- -- -- -- --  CREATININE -- 0.95 0.77 -- 0.95 -- -- -- -- -- -- -- -- -- -- -- -- -- --  CALCIUM -- 7.2* 5.4* -- 6.3* -- -- -- -- -- -- -- -- -- -- -- -- -- --  MG -- -- -- -- -- 2.1 -- 2.3 -- -- -- --  1.4* -- -- -- -- -- --  PHOS -- -- -- -- -- -- -- -- -- -- -- -- -- -- -- -- 1.8* -- 4.5  AST -- 60* -- -- -- 111* -- -- -- -- -- 292* -- -- -- -- -- -- --  ALT -- 39 -- -- -- 50 -- -- -- -- -- 78* -- -- -- -- -- -- --  ALKPHOS -- 70 -- -- -- 68 -- -- -- -- -- 69 -- -- -- -- -- -- --  BILITOT -- 0.3 -- -- -- 0.2* -- -- -- -- -- 0.2* -- -- -- -- -- -- --  PROT -- 4.8* -- -- -- 4.5* -- -- -- -- -- 5.1* -- -- -- -- -- -- --  ALBUMIN -- 1.9* -- -- -- 1.9* -- -- -- -- -- 2.3* -- -- -- -- -- -- --  APTT -- -- -- -- -- -- -- -- -- -- -- -- -- -- -- 55* -- -- 31  INR -- -- -- -- -- -- -- -- -- -- -- -- -- -- -- 1.48 -- -- 1.29  LATICACIDVEN -- -- -- -- -- -- -- -- -- 2.0 1.7 -- -- -- 1.5 -- -- -- --  TROPONINI -- -- -- -- -- -- -- -- -- -- -- -- -- >20.00* -- -- >20.00* 4.43* --  PROCALCITON -- -- -- -- -- -- -- -- -- -- -- -- -- -- -- -- -- -- --  PROBNP -- -- -- -- -- -- -- -- -- -- -- -- -- -- -- -- -- -- --  O2SATVEN -- -- -- -- -- -- -- -- -- -- -- -- -- -- -- -- -- -- --  PHART 7.410 -- -- -- -- -- 7.419 -- 7.206* -- -- -- -- -- -- -- -- -- --  PCO2ART 29.6* -- -- -- -- -- 26.4* -- 35.2 -- -- -- -- -- -- -- -- -- --  PO2ART 114.0* -- -- -- -- -- 129.0* -- 116.0* -- -- -- -- -- -- -- -- -- --    Lab 05/14/12 0758 05/14/12 0355 05/13/12 2329 05/13/12 1922 05/13/12 1603  GLUCAP 187* 160* 154* 156* 113*   IMAGING:  pcxr - unchanged bilateral changes, effusions likely, ett  wnl  DIAGNOSES: Active Problems:  HYPERLIPIDEMIA  HYPERTENSION  Asymptomatic old MI (myocardial infarction)  Coronary artery disease  History of coronary artery bypass surgery  Ventricular fibrillation  Cardiac arrest  Acute respiratory failure  Acute encephalopathy  Hyperglycemia  ASSESSMENT / PLAN:  PULMONARY  A:  Acute respiratory failure.  Congenital pectus excavatum, hypothermia, uncompensated met acidosis Some restrictive dz, anatomy, PE small lingula (post op) P:   Continue heparin Will dc bicarb as  co2 aproach 20 Wean cpap5 ps 5, goal 1 hr Assess strength, VC, NIF reduce MV on rest slight  CARDIOVASCULAR  A: VF cardiac arrest. CAD. Dyslipidemia, int VT continues, s/p Angioplasty lad, small PE P:  Goal MAP>60, require low dose pressors Accuracy on cvp in ?, effusions, kvo, dc cvp May need lasix soon Hep for pe  Does not require steroids, doe snot meet criteria rel ai  RENAL  A:  NON AG  And small AG after albumin correction P:   kvo 1/2 NS Dc bicarb Chem in am  Avoid Nacl  GASTROINTESTINAL  A:  Recent SBO / lysis of adhesions P:   Tolerated T F ppi  HEMATOLOGIC  A:  Anemia, leukocytosis, PE lingula small P:  Heparin drip toleratd this time Cbc in am   INFECTIOUS  A:  Leukocytosis improved, s/p sbo P:   Remain son pressors, continue current abx regimen, will consider dc vanc in am if no mrsa, enteroccus noted  ENDOCRINE   A:  Hyperglycemia. Controlled, Likely sick euthyroid P:   ICU Glycemic Control   NEUROLOGIC  A:  Acute encephalopathy. Follows simple commands P:   WUA Chair upright Use fent  CLINICAL SUMMARY:  78 yo with congenital pectus excavatum and recent admission for SBO / lysis of adhesions brought to [facility] ED after suffering VF cardiac arrest and ROSC after brief CPR and shocks x 3.  Hypothermia protocol. PE noted on CT small, now awake, weaning, remains on pressors, dc steroids, keep current abx regimen, no fevers, accuracy of volume status?  I have personally obtained a history, examined the patient, evaluated laboratory and imaging results, formulated the assessment and plan and placed orders.  CRITICAL CARE:  The patient is critically ill with multiple organ systems failure and requires high complexity decision making for assessment and support, frequent evaluation and titration of therapies, application of advanced monitoring technologies and extensive interpretation of multiple databases. Critical Care Time devoted to patient care  services described in this note is 30 minutes.   Nelda Bucks., MD  Pulmonary and Critical Care Medicine [facility] Pager: [phone]  05/14/2012, 8:10 AM

## 2012-05-15 ENCOUNTER — Inpatient Hospital Stay (HOSPITAL_COMMUNITY): Payer: Medicare Other

## 2012-05-15 DIAGNOSIS — I2699 Other pulmonary embolism without acute cor pulmonale: Secondary | ICD-10-CM

## 2012-05-15 LAB — CBC WITH DIFFERENTIAL/PLATELET
Basophils Absolute: 0 10*3/uL (ref 0.0–0.1)
Basophils Absolute: 0 10*3/uL (ref 0.0–0.1)
Basophils Relative: 0 % (ref 0–1)
Eosinophils Absolute: 0 10*3/uL (ref 0.0–0.7)
Eosinophils Absolute: 0 10*3/uL (ref 0.0–0.7)
Eosinophils Relative: 0 % (ref 0–5)
Eosinophils Relative: 0 % (ref 0–5)
HCT: 26.4 % — ABNORMAL LOW (ref 39.0–52.0)
Lymphocytes Relative: 3 % — ABNORMAL LOW (ref 12–46)
MCH: 31.5 pg (ref 26.0–34.0)
MCHC: 33.3 g/dL (ref 30.0–36.0)
MCV: 96 fL (ref 78.0–100.0)
Monocytes Absolute: 0.6 10*3/uL (ref 0.1–1.0)
Neutro Abs: 9.9 10*3/uL — ABNORMAL HIGH (ref 1.7–7.7)
Platelets: 250 10*3/uL (ref 150–400)
RDW: 14.6 % (ref 11.5–15.5)
RDW: 14.3 % (ref 11.5–15.5)
WBC: 15.5 10*3/uL — ABNORMAL HIGH (ref 4.0–10.5)

## 2012-05-15 LAB — BASIC METABOLIC PANEL
BUN: 25 mg/dL — ABNORMAL HIGH (ref 6–23)
BUN: 26 mg/dL — ABNORMAL HIGH (ref 6–23)
CO2: 26 mEq/L (ref 19–32)
Calcium: 7.4 mg/dL — ABNORMAL LOW (ref 8.4–10.5)
Calcium: 6.8 mg/dL — ABNORMAL LOW (ref 8.4–10.5)
Calcium: 7.3 mg/dL — ABNORMAL LOW (ref 8.4–10.5)
Creatinine, Ser: 0.85 mg/dL (ref 0.50–1.35)
Creatinine, Ser: 0.99 mg/dL (ref 0.50–1.35)
GFR calc non Af Amer: 70 mL/min — ABNORMAL LOW (ref >90)
GFR calc non Af Amer: 82 mL/min — ABNORMAL LOW (ref >90)
GFR calc non Af Amer: 77 mL/min — ABNORMAL LOW (ref >90)
Glucose, Bld: 109 mg/dL — ABNORMAL HIGH (ref 70–99)
Glucose, Bld: 109 mg/dL — ABNORMAL HIGH (ref 70–99)
Sodium: 142 mEq/L (ref 135–145)
Sodium: 144 mEq/L (ref 135–145)

## 2012-05-15 LAB — GLUCOSE, CAPILLARY
Glucose-Capillary: 108 mg/dL — ABNORMAL HIGH (ref 70–99)
Glucose-Capillary: 126 mg/dL — ABNORMAL HIGH (ref 70–99)

## 2012-05-15 MED ORDER — POTASSIUM CHLORIDE 20 MEQ/15ML (10%) PO LIQD
ORAL | Status: AC
  Administered 2012-05-15: 20 meq
  Filled 2012-05-15: qty 30

## 2012-05-15 MED ORDER — POTASSIUM CHLORIDE 20 MEQ/15ML (10%) PO LIQD
ORAL | Status: AC
  Filled 2012-05-15: qty 15

## 2012-05-15 MED ORDER — POTASSIUM CHLORIDE 20 MEQ/15ML (10%) PO LIQD
40.0000 meq | ORAL | Status: AC
  Administered 2012-05-15 (×2): 40 meq
  Filled 2012-05-15 (×2): qty 30

## 2012-05-15 MED ORDER — DEXTROSE 10 % IV SOLN
INTRAVENOUS | Status: DC
  Administered 2012-05-15: 08:00:00 via INTRAVENOUS

## 2012-05-15 MED ORDER — POTASSIUM CHLORIDE 10 MEQ/50ML IV SOLN
10.0000 meq | INTRAVENOUS | Status: AC
  Administered 2012-05-15 (×5): 10 meq via INTRAVENOUS

## 2012-05-15 MED ORDER — POTASSIUM CHLORIDE 10 MEQ/50ML IV SOLN
INTRAVENOUS | Status: AC
  Filled 2012-05-15: qty 250

## 2012-05-15 NOTE — Progress Notes (Signed)
PULMONARY  / CRITICAL CARE MEDICINE  Name: Louis Reid MRN: [id number] DOB: 19-Apr-1933    LOS: 5  REFERRING MD:  PMD  CHIEF COMPLAINT:  Cardiac arrest  BRIEF PATIENT DESCRIPTION:  78 yo with CAD, congenital pectus excavatum and recent admission for SBO / lysis of adhesions brought to [facility] ED after suffering VF cardiac arrest and ROSC after brief CPR and shocks x 3.  Hypothermia protocol started. Cathed. Pe on CT small 12/5  LINES / TUBES: 12/3  OETT >>> 12/3  OGT >>> 12/3  Foley >>> 12/3  L [place] CVL >>>  CULTURES: 12/5 BC>>> 12/5 sputum>>>NF  ANTIBIOTICS: vanc 12/5>>>12/9 Zosyn 12/5>>>plan stop 12/12 myco 12/5>>>12/6  SIGNIFICANT EVENTS:  12/3  Bought to [place] ED after suffering VF cardiac arrest and ROSC after brief CPR and shocks x 3.  Hypothermia protocol started.   12/3- cath- PTCA (balloon angioplasty) only proximal/mid LAD Thrombectomy occluded LAD 12/4- hypothermia continuous, oozing mouth, urine 12/5-acidosis, bleeding resolved, shock worse 12/5 CT chest - lingula PE, small efusions, Ct head - neg acute 12/5 CT abndo/eplvis - Distended gallbladder, containing sludge versus vicariously<BR>excreted contrast.<BR>3. Resolution of small bowel dilatation since prior exam. No<BR>evidence of abscess 12/6- follows commands on rewarm 12/7- low dose pressor needs 12/8- neg 2.1 liters  LEVEL OF CARE:  ICU  PRIMARY SERVICE:  PCCM  CONSULTANTS:  Cardiology  CODE STATUS:  Full  DIET:  TF 12/6>>>  DVT Px:  Heparin drip for PE  GI Px:  Protonix  INTERVAL HISTORY: Weaning well, neg balance tolerated   VITAL SIGNS: Temp:  [97.8 F (36.6 C)-98.6 F (37 C)] 97.8 F (36.6 C) (12/08 0323) Pulse Rate:  [72-98] 72  (12/08 0500) Resp:  [13-20] 16  (12/08 0500) BP: (89-104)/(57-69) 90/57 mmHg (12/08 0500) SpO2:  [97 %-100 %] 100 % (12/08 0500) Arterial Line BP: (97-141)/(46-76) 109/52 mmHg (12/07 2336) FiO2 (%):  [30 %] 30 % (12/08 0413) Weight:  [65.9 kg (145 lb 4.5 oz)]  65.9 kg (145 lb 4.5 oz) (12/08 0441)  HEMODYNAMICS: CVP:  [3 mmHg-8 mmHg] 7 mmHg  VENTILATOR SETTINGS: Vent Mode:  [-] PRVC FiO2 (%):  [30 %] 30 % Set Rate:  [14 bmp] 14 bmp Vt Set:  [500 mL] 500 mL PEEP:  [5 cmH20] 5 cmH20 Plateau Pressure:  [7 cmH20-15 cmH20] 12 cmH20 INTAKE / OUTPUT: Intake/Output      12/07 0701 - 12/08 0700 12/08 0701 - 12/09 0700   I.V. (mL/kg) 887 (13.5)    NG/GT 950    IV Piggyback 204    Total Intake(mL/kg) 2041 (31)    Urine (mL/kg/hr) 4180 (2.6)    Total Output 4180    Net -2139          PHYSICAL EXAMINATION: General:  Appears acutely ill, stronger Neuro:  rass -2 to -1, follows commands well HEENT:  PERRL 3 mm Cardiovascular:  RRR, no new sounds Lungs:  Reduced but clearer, pectus excavatum Abdomen:  Soft, nontender, surgical incision C/D/I,increased BS Musculoskeletal:  Moves all extremities, no edema Skin:  Intact  LABS:  Lab 05/15/12 0435 05/14/12 0505 05/14/12 0500 05/13/12 2111 05/13/12 1115 05/13/12 0506 05/13/12 0500 05/12/12 1345 05/12/12 1200 05/12/12 1000 05/12/12 0438 05/12/12 0411 05/12/12 0357 05/11/12 0617 05/11/12 0613 05/11/12 0206 05/11/12 05/10/12 1837 05/10/12 1835  HGB 9.4* -- 10.4* -- 9.9* -- -- -- -- -- -- -- -- -- -- -- -- -- --  WBC 15.5* -- 18.2* -- 14.6* -- -- -- -- -- -- -- -- -- -- -- -- -- --  PLT 250 -- 322 -- 278 -- -- -- -- -- -- -- -- -- -- -- -- -- --  NA 142 -- 139 140 -- -- -- -- -- -- -- -- -- -- -- -- -- -- --  K 3.0* -- 4.2 -- -- -- -- -- -- -- -- -- -- -- -- -- -- -- --  CL 109 -- 111 115* -- -- -- -- -- -- -- -- -- -- -- -- -- -- --  CO2 26 -- 19 16* -- -- -- -- -- -- -- -- -- -- -- -- -- -- --  GLUCOSE 159* -- 197* 144* -- -- -- -- -- -- -- -- -- -- -- -- -- -- --  BUN 29* -- 25* 19 -- -- -- -- -- -- -- -- -- -- -- -- -- -- --  CREATININE 1.01 -- 0.95 0.77 -- -- -- -- -- -- -- -- -- -- -- -- -- -- --  CALCIUM 7.4* -- 7.2* 5.4* -- -- -- -- -- -- -- -- -- -- -- -- -- -- --  MG -- -- -- -- -- 2.1 --  2.3 -- -- -- -- 1.4* -- -- -- -- -- --  PHOS -- -- -- -- -- -- -- -- -- -- -- -- -- -- -- -- 1.8* -- 4.5  AST -- -- 60* -- -- 111* -- -- -- -- -- 292* -- -- -- -- -- -- --  ALT -- -- 39 -- -- 50 -- -- -- -- -- 78* -- -- -- -- -- -- --  ALKPHOS -- -- 70 -- -- 68 -- -- -- -- -- 69 -- -- -- -- -- -- --  BILITOT -- -- 0.3 -- -- 0.2* -- -- -- -- -- 0.2* -- -- -- -- -- -- --  PROT -- -- 4.8* -- -- 4.5* -- -- -- -- -- 5.1* -- -- -- -- -- -- --  ALBUMIN -- -- 1.9* -- -- 1.9* -- -- -- -- -- 2.3* -- -- -- -- -- -- --  APTT -- -- -- -- -- -- -- -- -- -- -- -- -- -- -- 55* -- -- 31  INR -- -- -- -- -- -- -- -- -- -- -- -- -- -- -- 1.48 -- -- 1.29  LATICACIDVEN -- -- -- -- -- -- -- -- -- 2.0 1.7 -- -- -- 1.5 -- -- -- --  TROPONINI -- -- -- -- -- -- -- -- -- -- -- -- -- >20.00* -- -- >20.00* 4.43* --  PROCALCITON -- -- -- -- -- -- -- -- -- -- -- -- -- -- -- -- -- -- --  PROBNP -- -- -- -- -- -- -- -- -- -- -- -- -- -- -- -- -- -- --  O2SATVEN -- -- -- -- -- -- -- -- -- -- -- -- -- -- -- -- -- -- --  PHART -- 7.410 -- -- -- -- 7.419 -- 7.206* -- -- -- -- -- -- -- -- -- --  PCO2ART -- 29.6* -- -- -- -- 26.4* -- 35.2 -- -- -- -- -- -- -- -- -- --  PO2ART -- 114.0* -- -- -- -- 129.0* -- 116.0* -- -- -- -- -- -- -- -- -- --    Lab 05/15/12 0323 05/14/12 2344 05/14/12 1922 05/14/12 1634 05/14/12 1143  GLUCAP 133* 126* 159* 156* 188*   IMAGING:  pcxr -pending  DIAGNOSES: Active Problems:  HYPERLIPIDEMIA  HYPERTENSION  Asymptomatic old MI (myocardial infarction)  Coronary artery disease  History of coronary artery bypass surgery  Ventricular fibrillation  Cardiac arrest  Acute respiratory failure  Acute encephalopathy  Hyperglycemia  ASSESSMENT / PLAN:  PULMONARY  A:  Acute respiratory failure.  Congenital pectus excavatum, hypothermia, uncompensated met acidosis Some restrictive dz, anatomy, PE small lingula (post op) P:   Continue heparin for PE for now Ensure dopplers legs done Wean cpap5  ps 5, assess rsbi His strength is better Will clarify DNI status vs elective reintubation in furture if needed Did very well with neg balance, maintain this pcxr pending to review this am   CARDIOVASCULAR  A: VF cardiac arrest. CAD. Dyslipidemia, int VT continues, s/p Angioplasty lad, small PE, no evidence real AI P:  Goal MAP>60, require low dose pressors, down to 2 mics, this can likely be dc'ed cvp not accurate, dc Agree lasix Hep for pe   RENAL  A:  NON AG  And small AG after albumin correction - resolved P:   kvo 1/2 NS May need slight reduction lasix Goal neg 1 lityer next 24 hrs  GASTROINTESTINAL  A:  Recent SBO / lysis of adhesions P:   Tolerated TF, hold for weaning ppi  HEMATOLOGIC  A:  Anemia, leukocytosis, PE lingula small P:  Heparin drip toleratd this time Cbc in am   INFECTIOUS  A:  Leukocytosis improved, s/p sbo, PNA? P:   Dc vanc Add stop date zosyn, had recent nosocomial hospital stay  ENDOCRINE   A:  Hyperglycemia. Controlled, Likely sick euthyroid P:   ICU Glycemic Control   NEUROLOGIC  A:  Acute encephalopathy. Follows commands well P:   WUA Chair upright Use fent dc drip  CLINICAL SUMMARY:  78 yo with congenital pectus excavatum and recent admission for SBO / lysis of adhesions brought to [facility] ED after suffering VF cardiac arrest and ROSC after brief CPR and shocks x 3.  Hypothermia protocol. PE noted on CT small, now awake, weaning, remains on pressors but about off, likely to extubate, diuresis to maintain  I have personally obtained a history, examined the patient, evaluated laboratory and imaging results, formulated the assessment and plan and placed orders.  CRITICAL CARE:  The patient is critically ill with multiple organ systems failure and requires high complexity decision making for assessment and support, frequent evaluation and titration of therapies, application of advanced monitoring technologies and extensive  interpretation of multiple databases. Critical Care Time devoted to patient care services described in this note is 30 minutes.   Nelda Bucks., MD  Pulmonary and Critical Care Medicine [facility] Pager: [phone]  05/15/2012, 7:57 AM

## 2012-05-15 NOTE — Procedures (Signed)
Extubation Procedure Note  Patient Details:   Name: Louis Reid DOB: 03/22/1933 MRN: [id number]   Airway Documentation:     Evaluation  O2 sats: stable throughout Complications: No apparent complications Patient did tolerate procedure well. Bilateral Breath Sounds:  (very dim RT lung fields) Suctioning: Airway Yes NIF -20 [id number]  Positive cuff leak  Newt Lukes 05/15/2012, 9:31 AM

## 2012-05-15 NOTE — Progress Notes (Signed)
eLink Physician-Brief Progress Note Patient Name: Louis Reid DOB: [birth year] MRN: [id number]  Date of Service  05/15/2012   HPI/Events of Note  Hypokalemia   eICU Interventions  Potassium replaced   Intervention Category Minor Interventions: Electrolytes abnormality - evaluation and management  DETERDING,ELIZABETH 05/15/2012, 5:27 AM

## 2012-05-15 NOTE — Progress Notes (Signed)
eLink Physician-Brief Progress Note Patient Name: Louis Reid DOB: 1933/04/08 MRN: [id number]  Date of Service  05/15/2012   HPI/Events of Note   Decreased BP, 83/58 with MAP 63; received lasix and urine output was 4L yesterday and 3L today   eICU Interventions  We will stop lasix; reassess need for lasix in the am      [facility] 05/15/2012, 5:45 PM

## 2012-05-15 NOTE — Evaluation (Signed)
Clinical/Bedside Swallow Evaluation Patient Details  Name: Louis Reid MRN: [id number] Date of Birth: 1933-03-30  Today's Date: 05/15/2012 Time: 1300-1345 SLP Time Calculation (min): 45 min  Past Medical History:  Past Medical History  Diagnosis Date  . Acute myocardial infarction of anterolateral wall, episode of care unspecified   . Coronary atherosclerosis of artery bypass graft   . Sinoatrial node dysfunction   . HTN (hypertension)   . HLD (hyperlipidemia)   . Depression   . History of kidney stones   . Congenital funnel chest   . Inguinal hernia   . Osteoporosis    Past Surgical History:  Past Surgical History  Procedure Date  . Coronary artery bypass graft 1996  . Inguinal hernia repair 1996    right, hx  . Transurethral resection of prostate   . Ptca     percutaneous transluminal coronary intervention and brachy therapy, Bruce R. Juanda Chance, MD. EF 60%  . Laparotomy 04/27/2012    Procedure: EXPLORATORY LAPAROTOMY;  Surgeon: Cherylynn Ridges, MD;  Location: [facility] OR;  Service: General;  Laterality: N/A;  . Esophagogastroduodenoscopy 05/08/2012    Procedure: ESOPHAGOGASTRODUODENOSCOPY (EGD);  Surgeon: Charna Elizabeth, MD;  Location: [facility] ENDOSCOPY;  Service: Endoscopy;  Laterality: N/A;  Gunnar Fusi put on for Dr. Loreta Ave , Dr. Loreta Ave will call if she wants another time   HPI:  78 y/o male with CAD, congenital pectus excavatum with recent admission for SBO/lysis of adhesions brought to [facility] ED after suffering VF cardiac arrest and ROSC after brief CPR and shocks x3.  Patient intubated 12/3 to 12/8.  CT of chest showed tiny pulmonary embolus in lingula. Patient referred for BSE to assess risk for aspiration following extubation.   Assessment / Plan / Recommendation Clinical Impression  Suspected pharyngeal dysphagia due to overall weakness with decreased reserve, lethargy with decreased safety awareness.  +s/s of penetration vs. aspiration  moderately after swallow of thin water by cup due to marked  delay in initiation.  Evaluation limited as patient politely declined  PO trials of nectar thick liquids and puree consistency.  Decreased sustained attention noted with patient fixated on "getting an enema."   Due to s/s present , weakness, and recent intubation recommend contined NPO status with exception of medication administered whole in puree and ice chips only after oral care.  ST to reassess for PO readiness on 05/16/12.  Sharee Pimple ability to resume PO diet good.     Aspiration Risk    Moderate due to lethargy and cognitive deficits   Diet Recommendation NPO except meds;Ice chips PRN after oral care   Medication Administration: Whole meds with puree    Other  Recommendations Oral Care Recommendations: Oral care QID   Follow Up Recommendations  Inpatient Rehab    Frequency and Duration min 2x/week  2 weeks       SLP Swallow Goals Goal #3: Patient will maintain functional sustained attention while consuming diagnostic PO trials of various consistencies administered by SLP only with no outward s/s of aspiration.     Swallow Study Prior Functional Status   Lived at home independent     General Date of Onset: 05/10/12 HPI:  78 y/o male with CAD, congenital pectus excavatum with recent admission for SBO/lysis of adhesions brought to [facility] ED after suffering VF cardiac arrest and ROSC after brief CPR and shocks x3.  PE  Type of Study: Bedside swallow evaluation Diet Prior to this Study: NPO Temperature Spikes Noted: No Respiratory Status: Supplemental O2 delivered via (comment) History  of Recent Intubation: Yes Length of Intubations (days): 5 days Date extubated: 05/15/12 Behavior/Cognition: Confused;Lethargic;Distractible;Requires cueing Oral Cavity - Dentition: Adequate natural dentition Self-Feeding Abilities: Total assist Patient Positioning: Partially reclined Baseline Vocal Quality: Clear;Low vocal intensity Volitional Cough: Weak Volitional Swallow: Able to elicit     Oral/Motor/Sensory Function Overall Oral Motor/Sensory Function: Appears within functional limits for tasks assessed   Ice Chips Ice chips: Impaired Pharyngeal Phase Impairments: Suspected delayed Swallow;Decreased hyoid-laryngeal movement   Thin Liquid Thin Liquid: Impaired Presentation: Spoon;Cup Pharyngeal  Phase Impairments: Suspected delayed Swallow;Decreased hyoid-laryngeal movement;Throat Clearing - Delayed;Cough - Delayed    Nectar Thick Nectar Thick Liquid: Not tested   Honey Thick Honey Thick Liquid: Not tested   Puree Puree: Not tested   Solid   GO    Solid: Not tested      Moreen Fowler MS, CCC-SLP [phone] [facility] 05/15/2012,2:18 PM

## 2012-05-15 NOTE — Progress Notes (Addendum)
Patient ID: Louis Reid, male   DOB: 07/21/1932, 78 y.o.   MRN: [id number]     SUBJECTIVE: Patient awake on vent this morning, following commands.  Titrating down on norepi, now at 2.  Good UOP with Lasix yesterday, weight is down.  Creatinine stable.  CVP 10.      Marland Kitchen antiseptic oral rinse  1 application Mouth Rinse QID  . aspirin  81 mg Oral Daily  . atorvastatin  80 mg Per Tube q1800  . chlorhexidine  15 mL Mouth/Throat BID  . feeding supplement (OSMOLITE 1.5 CAL)  1,000 mL Per Tube Q24H  . feeding supplement  30 mL Per Tube TID  . furosemide  40 mg Intravenous BID  . insulin aspart  0-9 Units Subcutaneous Q4H  . multivitamin  5 mL Per Tube Daily  . pantoprazole (PROTONIX) IV  40 mg Intravenous QHS  . piperacillin-tazobactam (ZOSYN)  IV  3.375 g Intravenous Q8H  . [COMPLETED] potassium chloride  40 mEq Per Tube Q1 Hr x 2  . [COMPLETED] potassium chloride      . potassium chloride      . potassium chloride      . sodium chloride  1,000 mL Intravenous Once  . Ticagrelor  90 mg Oral BID  . [DISCONTINUED] vancomycin  500 mg Intravenous Q12H  norepinephrine @ 2 Heparin gtt   Filed Vitals:   05/15/12 0430 05/15/12 0441 05/15/12 0500 05/15/12 0800  BP:   90/57 95/62  Pulse: 74  72 84  Temp:    98.2 F (36.8 C)  TempSrc:    Axillary  Resp: 14  16 14   Height:      Weight:  145 lb 4.5 oz (65.9 kg)    SpO2: 100%  100% 99%    Intake/Output Summary (Last 24 hours) at 05/15/12 0855 Last data filed at 05/15/12 0835  Gross per 24 hour  Intake 1954.4 ml  Output   4605 ml  Net -2650.6 ml    LABS: Basic Metabolic Panel:  Basename 05/15/12 0435 05/14/12 0500 05/13/12 0506 05/12/12 1345  NA 142 139 -- --  K 3.0* 4.2 -- --  CL 109 111 -- --  CO2 26 19 -- --  GLUCOSE 159* 197* -- --  BUN 29* 25* -- --  CREATININE 1.01 0.95 -- --  CALCIUM 7.4* 7.2* -- --  MG -- -- 2.1 2.3  PHOS -- -- -- --   Liver Function Tests:  Basename 05/14/12 0500 05/13/12 0506  AST 60* 111*    ALT 39 50  ALKPHOS 70 68  BILITOT 0.3 0.2*  PROT 4.8* 4.5*  ALBUMIN 1.9* 1.9*   No results found for this basename: LIPASE:2,AMYLASE:2 in the last 72 hours CBC:  Basename 05/15/12 0435 05/14/12 0500  WBC 15.5* 18.2*  NEUTROABS 14.3* 17.0*  HGB 9.4* 10.4*  HCT 28.5* 31.4*  MCV 96.0 93.7  PLT 250 322   Cardiac Enzymes: No results found for this basename: CKTOTAL:3,CKMB:3,CKMBINDEX:3,TROPONINI:3 in the last 72 hours BNP: No components found with this basename: POCBNP:3 D-Dimer: No results found for this basename: DDIMER:2 in the last 72 hours Hemoglobin A1C: No results found for this basename: HGBA1C in the last 72 hours Fasting Lipid Panel: No results found for this basename: CHOL,HDL,LDLCALC,TRIG,CHOLHDL,LDLDIRECT in the last 72 hours Thyroid Function Tests: No results found for this basename: TSH,T4TOTAL,FREET3,T3FREE,THYROIDAB in the last 72 hours Anemia Panel: No results found for this basename: VITAMINB12,FOLATE,FERRITIN,TIBC,IRON,RETICCTPCT in the last 72 hours  RADIOLOGY: Ct Angio Chest Pe W/cm &/  or Wo Cm  05/12/2012  *RADIOLOGY REPORT*  Clinical Data: Pulmonary infiltrates.  Cardiac arrest.  CT ANGIOGRAPHY CHEST  Technique:  Multidetector CT imaging of the chest using the standard protocol during bolus administration of intravenous contrast. Multiplanar reconstructed images including MIPs were obtained and reviewed to evaluate the vascular anatomy.  Contrast: OMNIPAQUE IOHEXOL 300 MG/ML  SOLN  Comparison: Chest x-ray dated 05/12/2012  Findings: There is a single small pulmonary embolus and artery to the lingula of the left lung.  No other pulmonary emboli.  There are moderate bilateral pleural effusions with secondary atelectasis in the lower lobes.  The patient has a marked pectus excavatum deformity.  Extensive coronary artery calcification. Previous CABG.  No hilar or mediastinal adenopathy.   No acute osseous abnormality.  Ascites is noted in the upper abdomen.   IMPRESSION:  1.  Tiny pulmonary embolus in the lingula.  No other emboli. 2.  Moderate bilateral pleural effusions.   Original Report Authenticated By: Francene Boyers, M.D.     Dg Chest Port 1 View  05/14/2012  *RADIOLOGY REPORT*  Clinical Data: Edema.  PORTABLE CHEST - 1 VIEW  Comparison: 05/13/2012  Findings: Support devices are unchanged. Prior CABG.  Stable cardiomegaly.  Mild bilateral airspace disease, left greater than right, with small bilateral effusions, also left greater than right.  No real change since prior study.  IMPRESSION: No significant change asymmetric airspace disease and effusions, left greater than right, likely asymmetric edema.   Original Report Authenticated By: Charlett Nose, M.D.    Dg Chest Port 1 View  05/13/2012  *RADIOLOGY REPORT*  Clinical Data: Assess endotracheal.  PORTABLE CHEST - 1 VIEW  Comparison: Portable chest x-ray of 05/12/2012 and CT angio chest of the same date  Findings: The tip of the endotracheal tube is approximately 5.0 cm above the carina.  Basilar opacities remain left greater than right consistent with atelectasis, effusion, possibly left basilar pneumonia.  A left central venous line is unchanged in position. Heart size is stable.  IMPRESSION:  1.  Tip of endotracheal tube 5.0 cm above the carina. 2.  Little change in basilar opacities left greater than right.   Original Report Authenticated By: Dwyane Dee, M.D.      PHYSICAL EXAM General: NAD Neck: JVP 10+ cm, no thyromegaly or thyroid nodule.  Lungs: Dependent crackles CV: Nondisplaced PMI.  Heart regular S1/S2, no S3/S4, no murmur.  1+ edema at ankles (improved) Abdomen: Soft, nontender, no hepatosplenomegaly, no distention.  Neurologic: Alert and oriented x 3.  Psych: Normal affect. Extremities: No clubbing or cyanosis.   TELEMETRY: Reviewed telemetry pt in NSR  ASSESSMENT AND PLAN:  78 yo with history of CAD s/p CABG had recent SBO with lysis of adhesions.  Was readmitted with vfib  arrest (cardioverted with ROSC) and anterior STEMI.  He was started on hypothermia protocol and had balloon angioplasty/thrombectomy to occluded LAD (had atretic LIMA).  EF 20-25% by echo.   1. CAD: S/p ballooon angioplasty/thrombectomy to occluded LAD (no stent).  LIMA known to be atretic.  He is currently on ASA 81 and Brilinta which we will continue for now.  Continue atorvastatin 80.  He is also going to need coumadin given PE.  He had angioplasty without stenting => therefore, would envision short course of Brilinta + coumadin followed by coumadin + ASA 81 until he can come off coumadin.    2. Hypotension: Suspect cardiogenic shock with EF 20-25% post-MI.  Improving, BP better today, weaning down norepinephrine (to  2 this am) and should be able to stop today.  3. Acute systolic CHF: EF 16-10%.  Coming off norepinephrine.  Pulmonary edema on CXR and elevated JVP.  Diuresed well with IV Lasix yesterday, net negative over 2 liters and weight down.  Continue diuresis. Eventually will need ACEI/Coreg.  4. Acute respiratory failure/ventilated: Pulmonary edema, small PE (post-op), congenital pectus.  SBT ongoing, hope to extubate today.  - Continue to diurese.  - heparin gtt for PE - abx for ? Aspiration. 5. PE: Small, post-operative from lysis of adhesions operation.  On heparin gtt.  6. Vfib arrest: In setting of anterior MI treated with angioplasty.  Life vest at discharge with repeat echo in a month for ? ICD. Coreg when BP more stable and volume controlled.  7. Neuro: Awake, follows commands on vent.  8. Hypokalemia: Replaced this morning.  Repeat BMET at 4 pm.   Marca Ancona 05/15/2012 8:55 AM

## 2012-05-15 NOTE — Progress Notes (Signed)
Lungs CTA before and after Pt took small swallows of apple sauce w/Brilinta and asa.No coughing or gagging noted .Speech still clear .Sats still 98- 100% on 4L/[place] . No change in b/p or HR .

## 2012-05-15 NOTE — Progress Notes (Signed)
ANTICOAGULATION CONSULT NOTE - Follow Up Consult  Pharmacy Consult for heparin Indication: pulmonary embolus  Allergies  Allergen Reactions  . Cold Medicine Plus (Chlorphen-Pseudoephed-Apap)     COLD MEDICATIONS MAKE HIM "LOOPY"    Patient Measurements: Height: 5\' 9"  (175.3 cm) Weight: 145 lb 4.5 oz (65.9 kg) IBW/kg (Calculated) : 70.7  Heparin Dosing Weight: 65kg  Vital Signs: Temp: 97.8 F (36.6 C) (12/08 0323) Temp src: Oral (12/08 0323) BP: 90/57 mmHg (12/08 0500) Pulse Rate: 72  (12/08 0500)  Labs:  Basename 05/15/12 0435 05/14/12 0500 05/13/12 2111 05/13/12 2016 05/13/12 1115  HGB 9.4* 10.4* -- -- --  HCT 28.5* 31.4* -- -- 30.0*  PLT 250 322 -- -- 278  APTT -- -- -- -- --  LABPROT -- -- -- -- --  INR -- -- -- -- --  HEPARINUNFRC 0.28* 0.62 -- 0.58 --  CREATININE 1.01 0.95 0.77 -- --  CKTOTAL -- -- -- -- --  CKMB -- -- -- -- --  TROPONINI -- -- -- -- --    Estimated Creatinine Clearance: 57.1 ml/min (by C-G formula based on Cr of 1.01).  Assessment: Patient is a 78 y.o M on heparin for PE. LE doppler negative for DVT. Heparin level is at goal with repeat level. CBC stable  Goal of Therapy:  Heparin level 0.3-0.7 units/ml Monitor platelets by anticoagulation protocol: Yes   Plan:  1) continue current heparin regimen  Allahna Husband P 05/15/2012,8:00 AM

## 2012-05-15 NOTE — Progress Notes (Signed)
Pt refused to be monitored on telemetry.

## 2012-05-15 NOTE — Progress Notes (Signed)
VASCULAR LAB PRELIMINARY  PRELIMINARY  PRELIMINARY  PRELIMINARY  Bilateral lower extremity venous Dopplers completed.    Preliminary report:  There is no DVT or SVT noted in the bilateral lower extremities.  Gabreal Worton, RVT 05/15/2012, 10:35 AM

## 2012-05-15 NOTE — Progress Notes (Signed)
Speech is clear . O2 sats = 100% on 4L/[place].Pt alert ,appropriately conversive to topic matters.Pt oriented to person and place the patient unaware of month,year,recent abd surgery and reason for recent admission.Pt pleasant and cooperative .Using incentive spirometer to 1250 cc mark.

## 2012-05-15 NOTE — Progress Notes (Signed)
5:15 AM    Heparin level this am is 0.28. Previous 3 levels were therapeutic at same rate but drawn from A-line. Will continue at same rate a request peripheral stick at 10am. (0.28 from sub-clavian)   No bleeding reported.  Janice Coffin

## 2012-05-16 DIAGNOSIS — R570 Cardiogenic shock: Secondary | ICD-10-CM | POA: Diagnosis present

## 2012-05-16 DIAGNOSIS — I959 Hypotension, unspecified: Secondary | ICD-10-CM | POA: Insufficient documentation

## 2012-05-16 LAB — CBC WITH DIFFERENTIAL/PLATELET
Basophils Absolute: 0 10*3/uL (ref 0.0–0.1)
Hemoglobin: 9.2 g/dL — ABNORMAL LOW (ref 13.0–17.0)
Lymphocytes Relative: 6 % — ABNORMAL LOW (ref 12–46)
Lymphs Abs: 0.5 10*3/uL — ABNORMAL LOW (ref 0.7–4.0)
MCH: 31.7 pg (ref 26.0–34.0)
MCHC: 33.1 g/dL (ref 30.0–36.0)
MCV: 95.9 fL (ref 78.0–100.0)
Monocytes Absolute: 0.5 10*3/uL (ref 0.1–1.0)
Neutro Abs: 7.6 10*3/uL (ref 1.7–7.7)
Platelets: 219 10*3/uL (ref 150–400)

## 2012-05-16 LAB — BASIC METABOLIC PANEL
BUN: 25 mg/dL — ABNORMAL HIGH (ref 6–23)
CO2: 28 mEq/L (ref 19–32)
Calcium: 8 mg/dL — ABNORMAL LOW (ref 8.4–10.5)
GFR calc non Af Amer: 66 mL/min — ABNORMAL LOW (ref >90)
Glucose, Bld: 101 mg/dL — ABNORMAL HIGH (ref 70–99)

## 2012-05-16 LAB — GLUCOSE, CAPILLARY
Glucose-Capillary: 78 mg/dL (ref 70–99)
Glucose-Capillary: 106 mg/dL — ABNORMAL HIGH (ref 70–99)

## 2012-05-16 LAB — TROPONIN I: Troponin I: 15.02 ng/mL (ref <0.30)

## 2012-05-16 LAB — PHOSPHORUS: Phosphorus: 1.5 mg/dL — ABNORMAL LOW (ref 2.3–4.6)

## 2012-05-16 MED ORDER — WARFARIN VIDEO: Freq: Once | Status: DC

## 2012-05-16 MED ORDER — FUROSEMIDE 40 MG PO TABS
40.0000 mg | ORAL_TABLET | Freq: Two times a day (BID) | ORAL | Status: DC
  Administered 2012-05-16 – 2012-05-17 (×3): 40 mg via ORAL
  Filled 2012-05-16 (×5): qty 1

## 2012-05-16 MED ORDER — COUMADIN BOOK
Freq: Once | Status: AC
  Administered 2012-05-16: 10:00:00
  Filled 2012-05-16: qty 1

## 2012-05-16 MED ORDER — PANTOPRAZOLE SODIUM 40 MG PO TBEC
40.0000 mg | DELAYED_RELEASE_TABLET | Freq: Every day | ORAL | Status: DC
  Administered 2012-05-16 – 2012-05-20 (×5): 40 mg via ORAL
  Filled 2012-05-16 (×4): qty 1

## 2012-05-16 MED ORDER — WARFARIN - PHARMACIST DOSING INPATIENT
Freq: Every day | Status: DC
  Administered 2012-05-17: 18:00:00

## 2012-05-16 MED ORDER — ENSURE COMPLETE PO LIQD
237.0000 mL | Freq: Two times a day (BID) | ORAL | Status: DC
  Administered 2012-05-16 – 2012-05-19 (×7): 237 mL via ORAL

## 2012-05-16 MED ORDER — WARFARIN SODIUM 6 MG PO TABS
6.0000 mg | ORAL_TABLET | Freq: Once | ORAL | Status: AC
  Administered 2012-05-16: 6 mg via ORAL
  Filled 2012-05-16: qty 1

## 2012-05-16 NOTE — Progress Notes (Signed)
Speech Language Pathology Dysphagia Treatment Patient Details Name: Louis Reid MRN: [id number] DOB: 23-Oct-1932 Today's Date: 05/16/2012 Time: 2595-6387 SLP Time Calculation (min): 15 min  Assessment / Plan / Recommendation Clinical Impression  RN reports overall improvement since yesterday's BSE. Pt is alert, following commands, engaging in conversation with visitor. Pt exhibits no overt s/s aspiration with any consistency tested, including meds whole in puree.  Will recommend beginning with soft diet with chopped meats for energy conservation, with advancement as pt strength and endurance improve. ST to follow briefly to assess diet tolerance and readiness to advance. Pt currently reports very poor appetite.    Diet Recommendation  Initiate / Change Diet: Dysphagia 3 (mechanical soft);Thin liquid;Other (comment) (chop meats for energy conservation.)    SLP Plan Goals updated   Pertinent Vitals/Pain None reported   Swallowing Goals  SLP Swallowing Goals Patient will consume recommended diet without observed clinical signs of aspiration with: Minimal assistance Swallow Study Goal #1 - Progress: Progressing toward goal Patient will utilize recommended strategies during swallow to increase swallowing safety with: Minimal assistance Swallow Study Goal #2 - Progress: Progressing toward goal Swallow Study Goal #3 - Progress: Met  General Temperature Spikes Noted: No Respiratory Status: Room air Behavior/Cognition: Cooperative;Alert;Pleasant mood Patient Positioning: Upright in bed  Oral Cavity - Oral Hygiene Does patient have any of the following "at risk" factors?: Oxygen therapy - cannula, mask, simple oxygen devices Brush patient's teeth BID with toothbrush (using toothpaste with fluoride): Yes Patient is HIGH RISK - Oral Care Protocol followed (see row info): Yes Patient is AT RISK - Oral Care Protocol followed (see row info): Yes Patient is mechanically ventilated, follow VAP  prevention protocol for oral care: Oral care provided every 4 hours   Dysphagia Treatment Treatment focused on: Skilled observation of diet tolerance;Patient/family/caregiver education Treatment Methods/Modalities: Skilled observation Patient observed directly with PO's: Yes Type of PO's observed: Dysphagia 3 (soft);Thin liquids;Dysphagia 1 (puree) Feeding: Needs assist;Needs set up;Other (Comment) (PO trial required encouragement due to poor appetite.) Liquids provided via: Straw    Celia B. Bueche, [facility], CCC-SLP [phone]  05/16/2012, 9:59 AM

## 2012-05-16 NOTE — Progress Notes (Addendum)
ANTICOAGULATION CONSULT NOTE - Follow Up Consult  Pharmacy Consult for heparin Indication: pulmonary embolus  Allergies  Allergen Reactions  . Cold Medicine Plus (Chlorphen-Pseudoephed-Apap)     COLD MEDICATIONS MAKE HIM "LOOPY"   Patient Measurements: Height: 5\' 9"  (175.3 cm) Weight: 145 lb 4.5 oz (65.9 kg) IBW/kg (Calculated) : 70.7  Heparin Dosing Weight: 65kg  Vital Signs: Temp: 97.7 F (36.5 C) (12/09 0400) Temp src: Oral (12/09 0400) BP: 92/55 mmHg (12/09 0500) Pulse Rate: 81  (12/09 0500)  Labs:  Basename 05/16/12 0420 05/16/12 0147 05/15/12 1947 05/15/12 1600 05/15/12 1140 05/15/12 1025 05/15/12 0435  HGB 9.2* -- 8.8* -- -- -- --  HCT 27.8* -- 26.4* -- -- -- 28.5*  PLT 219 -- 224 -- -- -- 250  APTT -- -- -- -- -- -- --  LABPROT -- -- -- -- -- -- --  INR -- -- -- -- -- -- --  HEPARINUNFRC 0.36 -- -- -- -- 0.37 0.28*  CREATININE 1.05 -- -- 0.99 0.85 -- --  CKTOTAL -- -- -- -- -- -- --  CKMB -- -- -- -- -- -- --  TROPONINI -- 15.02* 13.28* -- -- -- --   Estimated Creatinine Clearance: 54.9 ml/min (by C-G formula based on Cr of 1.05).  Assessment: Patient is a 78 y.o. M on heparin for a new PE (lingula, tiny) seen on CT scan 12/5.  LE doppler negative for DVT. Heparin level is within desired goal range at 0.36IU/ml this morning.  His CBC is stable and he is without noted bleeding complications.    Goal of Therapy:  Heparin level 0.3-0.7 units/ml Monitor platelets by anticoagulation protocol: Yes   Plan:  1)   Will continue current Heparin at 1000 units/hr 2)   Follow up AM labs and adjust as needed 3)   Monitor for s/s bleeding  Nadara Mustard, PharmD., MS Clinical Pharmacist Pager:  [phone] Thank you for allowing pharmacy to be part of this patients care team. 05/16/2012,8:14 AM   Addendum: Adding Warfarin today. Baseline INR:    Ref. Range 05/10/2012 18:35  Prothrombin Time Latest Range: 11.6-15.2 seconds 15.8 (H)  INR Latest Range: 0.00-1.49   1.29   Plan:  Warfarin 6 mg x 1           F/U AM PT/INR           Monitor for bleeding complications           Will begin initial education, provide teaching information

## 2012-05-16 NOTE — Progress Notes (Signed)
Report given to [employer]. Pt aware of room change. Pt verbalizes understanding.

## 2012-05-16 NOTE — Progress Notes (Signed)
Patient Name: Louis Reid Date of Encounter: 05/16/2012   Principal Problem:  *Cardiac arrest Active Problems:  Coronary artery disease  Cardiogenic shock  Hypotension  HYPERLIPIDEMIA  HYPERTENSION  History of coronary artery bypass surgery  Ventricular fibrillation  Acute respiratory failure  Acute encephalopathy  Hyperglycemia   SUBJECTIVE  Contains of pain in posterior aspect of left calf; denies dyspnea or chest pain.  CURRENT MEDS    . antiseptic oral rinse  1 application Mouth Rinse QID  . aspirin  81 mg Oral Daily  . atorvastatin  80 mg Per Tube q1800  . chlorhexidine  15 mL Mouth/Throat BID  . insulin aspart  0-9 Units Subcutaneous Q4H  . multivitamin  5 mL Per Tube Daily  . pantoprazole (PROTONIX) IV  40 mg Intravenous QHS  . piperacillin-tazobactam (ZOSYN)  IV  3.375 g Intravenous Q8H  . [COMPLETED] potassium chloride  10 mEq Intravenous Q1 Hr x 5  . [EXPIRED] potassium chloride      . [EXPIRED] potassium chloride      . sodium chloride  1,000 mL Intravenous Once  . Ticagrelor  90 mg Oral BID  . [DISCONTINUED] feeding supplement (OSMOLITE 1.5 CAL)  1,000 mL Per Tube Q24H  . [DISCONTINUED] feeding supplement  30 mL Per Tube TID  . [DISCONTINUED] furosemide  40 mg Intravenous BID   OBJECTIVE  Filed Vitals:   05/16/12 0500 05/16/12 0600 05/16/12 0700 05/16/12 0800  BP: 92/55 81/48 92/61  91/63  Pulse: 81 81 72 80  Temp:    98 F (36.7 C)  TempSrc:    Oral  Resp: 18 16 18 17   Height:      Weight:      SpO2: 94% 94% 98% 95%    Intake/Output Summary (Last 24 hours) at 05/16/12 0840 Last data filed at 05/16/12 0800  Gross per 24 hour  Intake 1276.5 ml  Output   3306 ml  Net -2029.5 ml   Filed Weights   05/13/12 0500 05/14/12 0400 05/15/12 0441  Weight: 142 lb 6.7 oz (64.6 kg) 150 lb 2.1 oz (68.1 kg) 145 lb 4.5 oz (65.9 kg)   PHYSICAL EXAM  General: Pleasant, NAD. Neuro: Alert and oriented X 3. Moves all extremities spontaneously. HEENT:   Normal  Neck: Supple Lungs:  Diminished BS bases Heart: RRR no s3, s4, or murmurs. Abdomen: Soft, previous abdominal surgery Extremities: 1 + ankle edema; 2 + femoral pulses; dopplerable DP LLE; no chords; warm   Accessory Clinical Findings  CBC  Basename 05/16/12 0420 05/15/12 1947  WBC 8.6 10.9*  NEUTROABS 7.6 9.9*  HGB 9.2* 8.8*  HCT 27.8* 26.4*  MCV 95.9 94.6  PLT 219 224   Basic Metabolic Panel  Basename 05/16/12 0420 05/15/12 1947 05/15/12 1600  NA 142 -- 144  K 4.0 -- 4.1  CL 108 -- 110  CO2 28 -- 27  GLUCOSE 101* -- 109*  BUN 25* -- 26*  CREATININE 1.05 -- 0.99  CALCIUM 8.0* -- 7.3*  MG 2.0 1.9 --  PHOS 1.5* -- --   Liver Function Tests  Basename 05/14/12 0500  AST 60*  ALT 39  ALKPHOS 70  BILITOT 0.3  PROT 4.8*  ALBUMIN 1.9*   Cardiac Enzymes  Basename 05/16/12 0147 05/15/12 1947  CKTOTAL -- --  CKMB -- --  CKMBINDEX -- --  TROPONINI 15.02* 13.28*     Radiology/Studies  Dg Chest Port 1 View  05/15/2012  *RADIOLOGY REPORT*  Clinical Data: Assessed pulmonary edema.  Intubated  patient.  PORTABLE CHEST - 1 VIEW  Comparison: 05/14/2012  Findings: Bilateral pleural effusions with associated atelectasis is stable.  The lungs are hyperexpanded.  There is no pneumothorax.  Changes from CABG surgery are stable.  The endotracheal tube, nasogastric tube and left subclavian central venous line remain well positioned and unchanged.  IMPRESSION: No convincing active pulmonary edema.  Bilateral pleural effusions with lung base opacity, most likely atelectasis.   Original Report Authenticated By: Amie Portland, M.D.    ASSESSMENT AND PLAN 78 yo with history of CAD s/p CABG had recent SBO with lysis of adhesions. Was readmitted with vfib arrest (cardioverted with ROSC) and anterior STEMI. He was started on hypothermia protocol and had balloon angioplasty/thrombectomy to occluded LAD (had atretic LIMA). EF 20-25% by echo.  1. CAD: S/p ballooon angioplasty/thrombectomy  to occluded LAD (no stent). LIMA known to be atretic. He is currently on ASA 81 and Brilinta which we will continue for now. Continue atorvastatin 80. He is also going to need coumadin given PE. He had angioplasty without stenting => therefore, would envision short course of Brilinta + coumadin followed by coumadin + ASA 81 until he can come off coumadin.  2. Hypotension: Improving, BP borderline at present, norepinephrine weaned to off.  3. Acute systolic CHF: EF 16-10%. Still volume overloaded; add lasix; Eventually will need ACEI/Coreg when BP allows.  4. Acute respiratory failure/ventilated: Pulmonary edema, small PE (post-op), congenital pectus.  - Continue to diurese.  - heparin gtt for PE  - abx for ? Aspiration.  5. PE: Small, post-operative from lysis of adhesions operation. On heparin gtt. Begin coumadin. 6. Vfib arrest: In setting of anterior MI treated with angioplasty. Life vest at discharge with repeat echo in a month for ? ICD. Coreg when BP more stable and volume controlled.  7. LLE pain - foot warm and DP dopplerable; no evidence of ischemia; no chords palpated; continue heparin.     Signed, Olga Millers, MD 9:17 AM

## 2012-05-16 NOTE — Progress Notes (Signed)
PULMONARY  / CRITICAL CARE MEDICINE  Name: Louis Reid MRN: [id number] DOB: March 07, 1933    LOS: 6  REFERRING MD:  PMD  CHIEF COMPLAINT:  Cardiac arrest  BRIEF PATIENT DESCRIPTION:   78 yo male admitted on 05/10/2012 with VF arrest and VDRF, s/p hypothermia protocol.  Noted to have small PE on CT chest 12/05.  Had recent admit for SBO with lysis of adhesions.  Significant PMHx CAD s/p CABG, HTN, Hyperlipidemia, Depression, Pectus excavatum  LINES / TUBES: 12/3  OETT >>>1208 12/3  L [place] CVL >>>  CULTURES: 12/5 BC>>> 12/5 sputum>>>negative  ANTIBIOTICS: vanc 12/5>>>12/9 Zosyn 12/5>>>12/9 myco 12/5>>>12/6  SIGNIFICANT EVENTS:  12/3  Bought to [facility] ED after suffering VF cardiac arrest and ROSC after brief CPR and shocks x 3.  Hypothermia protocol started.   12/3- cath- PTCA (balloon angioplasty) only proximal/mid LAD Thrombectomy occluded LAD 12/4- hypothermia continuous, oozing mouth, urine 12/5-acidosis, bleeding resolved, shock worse 12/5 CT chest - lingula PE, small efusions, Ct head - neg acute 12/5 CT abndo/eplvis - Distended gallbladder, containing sludge versus vicariously<BR>excreted contrast.<BR>3. Resolution of small bowel dilatation since prior exam. No<BR>evidence of abscess 12/6- follows commands on rewarm 12/7- low dose pressor needs 12/8- neg 2.1 liters  LEVEL OF CARE:  ICU PRIMARY SERVICE:  PCCM CONSULTANTS:  Cardiology CODE STATUS:  No CPR, no defibrillation DIET: D3 diet DVT Px:  Heparin drip for PE/coumadin GI Px:  Protonix  INTERVAL HISTORY: Has trouble with short term memory.  Denies chest pain.  Not much of an appetite.  C/o Lt foot pain this AM >> better after dose of fentanyl.  VITAL SIGNS: Temp:  [97.7 F (36.5 C)-98.5 F (36.9 C)] 98 F (36.7 C) (12/09 0800) Pulse Rate:  [72-97] 79  (12/09 1000) Resp:  [13-18] 14  (12/09 1000) BP: (73-99)/(48-69) 88/59 mmHg (12/09 1000) SpO2:  [93 %-100 %] 96 % (12/09 1000)  HEMODYNAMICS: CVP:  [3  mmHg-7 mmHg] 6 mmHg  VENTILATOR SETTINGS:   INTAKE / OUTPUT: Intake/Output      12/08 0701 - 12/09 0700 12/09 0701 - 12/10 0700   I.V. (mL/kg) 911.9 (13.8) 80 (1.2)   NG/GT 0    IV Piggyback 376.5 25   Total Intake(mL/kg) 1288.4 (19.6) 105 (1.6)   Urine (mL/kg/hr) 3475 (2.2) 250   Stool 6    Total Output 3481 250   Net -2192.6 -145         PHYSICAL EXAMINATION: General:  No distress Neuro:  Follows commands HEENT: No sinus tenderness Cardiovascular:  s1s2 regular Lungs:  Reduced but clearer, pectus excavatum Abdomen:  Soft, nontender, surgical incision C/D/I,increased BS Musculoskeletal:  Moves all extremities, no edema Skin:  Intact  LABS:  Lab 05/16/12 0747 05/16/12 0420 05/16/12 0147 05/15/12 1947 05/15/12 1600 05/15/12 1140 05/15/12 0435 05/14/12 0505 05/14/12 0500 05/13/12 0506 05/13/12 0500 05/12/12 1200 05/12/12 1000 05/12/12 0438 05/12/12 0411 05/11/12 0613 05/11/12 0206 05/11/12 05/10/12 1835  HGB -- 9.2* -- 8.8* -- -- 9.4* -- -- -- -- -- -- -- -- -- -- -- --  WBC -- 8.6 -- 10.9* -- -- 15.5* -- -- -- -- -- -- -- -- -- -- -- --  PLT -- 219 -- 224 -- -- 250 -- -- -- -- -- -- -- -- -- -- -- --  NA -- 142 -- -- 144 145 -- -- -- -- -- -- -- -- -- -- -- -- --  K -- 4.0 -- -- 4.1 -- -- -- -- -- -- -- -- -- -- -- -- -- --  CL -- 108 -- -- 110 107 -- -- -- -- -- -- -- -- -- -- -- -- --  CO2 -- 28 -- -- 27 25 -- -- -- -- -- -- -- -- -- -- -- -- --  GLUCOSE -- 101* -- -- 109* 109* -- -- -- -- -- -- -- -- -- -- -- -- --  BUN -- 25* -- -- 26* 25* -- -- -- -- -- -- -- -- -- -- -- -- --  CREATININE -- 1.05 -- -- 0.99 0.85 -- -- -- -- -- -- -- -- -- -- -- -- --  CALCIUM -- 8.0* -- -- 7.3* 6.8* -- -- -- -- -- -- -- -- -- -- -- -- --  MG -- 2.0 -- 1.9 -- -- -- -- -- 2.1 -- -- -- -- -- -- -- -- --  PHOS -- 1.5* -- -- -- -- -- -- -- -- -- -- -- -- -- -- -- 1.8* 4.5  AST -- -- -- -- -- -- -- -- 60* 111* -- -- -- -- 292* -- -- -- --  ALT -- -- -- -- -- -- -- -- 39 50 -- -- -- -- 78* --  -- -- --  ALKPHOS -- -- -- -- -- -- -- -- 70 68 -- -- -- -- 69 -- -- -- --  BILITOT -- -- -- -- -- -- -- -- 0.3 0.2* -- -- -- -- 0.2* -- -- -- --  PROT -- -- -- -- -- -- -- -- 4.8* 4.5* -- -- -- -- 5.1* -- -- -- --  ALBUMIN -- -- -- -- -- -- -- -- 1.9* 1.9* -- -- -- -- 2.3* -- -- -- --  APTT -- -- -- -- -- -- -- -- -- -- -- -- -- -- -- -- 55* -- 31  INR -- -- -- -- -- -- -- -- -- -- -- -- -- -- -- -- 1.48 -- 1.29  LATICACIDVEN -- -- -- -- -- -- -- -- -- -- -- -- 2.0 1.7 -- 1.5 -- -- --  TROPONINI 14.16* -- 15.02* 13.28* -- -- -- -- -- -- -- -- -- -- -- -- -- -- --  PROCALCITON -- -- -- -- -- -- -- -- -- -- -- -- -- -- -- -- -- -- --  PROBNP -- -- -- -- -- -- -- -- -- -- -- -- -- -- -- -- -- -- --  O2SATVEN -- -- -- -- -- -- -- -- -- -- -- -- -- -- -- -- -- -- --  PHART -- -- -- -- -- -- -- 7.410 -- -- 7.419 7.206* -- -- -- -- -- -- --  PCO2ART -- -- -- -- -- -- -- 29.6* -- -- 26.4* 35.2 -- -- -- -- -- -- --  PO2ART -- -- -- -- -- -- -- 114.0* -- -- 129.0* 116.0* -- -- -- -- -- -- --    Lab 05/16/12 0804 05/16/12 0414 05/16/12 05/15/12 2051 05/15/12 1614  GLUCAP 106* 78 111* 104* 108*   IMAGING:  pcxr -pending  DIAGNOSES: Principal Problem:  *Cardiac arrest Active Problems:  HYPERLIPIDEMIA  HYPERTENSION  Coronary artery disease  History of coronary artery bypass surgery  Ventricular fibrillation  Acute respiratory failure  Acute encephalopathy  Hyperglycemia  Cardiogenic shock  Hypotension  ASSESSMENT / PLAN:  PULMONARY  A:   Acute respiratory failure 2nd to cardiac arrest. Resolved. Acute tiny PE. Likely related to recent surgery. P:  Continue heparin/coumadin per pharmacy May need hypercoag w/u as outpt F/u doppler legs  CARDIOVASCULAR  A:  VF cardiac arrest with anterior STEMI. S/p angioplasty/thrombectomy LAD. Acute on chronic systolic CHF. Relative hypotension. Hx of CAD, hyperlipidemia. P:  Negative fluid balance as tolerated Continue ASA,  lipitor Brilinta per cardiology Hold beta blocker until BP more stable  RENAL  A:  Non-anion gap acidosis. Resolved. P:   Monitor renal fx, urine outpt, electrolytes  GASTROINTESTINAL  A:  Nutrition. Dysphagia. P:   D3 diet  HEMATOLOGIC  A:   Anemia of critical illness. P:  F/u CBC  INFECTIOUS  A:  Initial concern for PNA>>less likely. P:   D/c Abx 12/09 and monitor clinically  ENDOCRINE   A:   Hyperglycemia. No hx of DM. P:   D/c SSI  NEUROLOGIC  A:  Acute encephalopathy 2nd to cardiac arrest. Still having difficulty with short term memory. P:   May need further neuro-psych evaluation when more stable.  Updated family at bedside.  Transfer to telemetry 12/09.  Transfer to [facility] 12/10 and PCCM sign off.  Coralyn Helling, MD [facility] Pulmonary/Critical Care 05/16/2012, 11:43 AM Pager:  [phone] After 3pm call: [phone]

## 2012-05-16 NOTE — Progress Notes (Signed)
PT Cancellation Note  Patient Details Name: CAINEN BURNHAM MRN: [id number] DOB: 10-28-1932   Cancelled Treatment:    Reason Eval/Treat Not Completed: Medical issues which prohibited therapy. Pt on bed rest for 30 minutes and then plans for transfer to the floor. Will f/u as time and schedule allows.   [facility] HELEN 05/16/2012, 1:34 PM Pager: [phone]

## 2012-05-16 NOTE — Progress Notes (Signed)
Nutrition Follow-up  Intervention:   1. Ensure Complete po BID, each supplement provides 350 kcal and 13 grams of protein.  2. RD will continue to follow     Assessment:   Pt tolerated weaning and was extubated on 12/8. Diet was advanced to Dysphagia 3, thin liquids this morning following assessment by SLP.  Pt reports poor appetite, lunch tray in room, states "I might be able to eat a bite"  Diet Order:  Dysphagia 3, thin liquids PO intake: none at this time.   Meds: Scheduled Meds:    . antiseptic oral rinse  1 application Mouth Rinse QID  . aspirin  81 mg Oral Daily  . atorvastatin  80 mg Per Tube q1800  . chlorhexidine  15 mL Mouth/Throat BID  . coumadin book   Does not apply Once  . furosemide  40 mg Oral BID  . multivitamin  5 mL Per Tube Daily  . pantoprazole  40 mg Oral Q1200  . [COMPLETED] potassium chloride  10 mEq Intravenous Q1 Hr x 5  . [EXPIRED] potassium chloride      . [EXPIRED] potassium chloride      . Ticagrelor  90 mg Oral BID  . warfarin  6 mg Oral ONCE-1800  . warfarin   Does not apply Once  . Warfarin - Pharmacist Dosing Inpatient   Does not apply q1800  . [DISCONTINUED] feeding supplement (OSMOLITE 1.5 CAL)  1,000 mL Per Tube Q24H  . [DISCONTINUED] feeding supplement  30 mL Per Tube TID  . [DISCONTINUED] furosemide  40 mg Intravenous BID  . [DISCONTINUED] insulin aspart  0-9 Units Subcutaneous Q4H  . [DISCONTINUED] pantoprazole (PROTONIX) IV  40 mg Intravenous QHS  . [DISCONTINUED] piperacillin-tazobactam (ZOSYN)  IV  3.375 g Intravenous Q8H  . [DISCONTINUED] sodium chloride  1,000 mL Intravenous Once   Continuous Infusions:    . sodium chloride 10 mL/hr at 05/14/12 1500  . sodium chloride 10 mL/hr at 05/14/12 1800  . dextrose 10 mL/hr at 05/15/12 0820  . heparin 1,000 Units/hr (05/15/12 1824)  . [DISCONTINUED] norepinephrine (LEVOPHED) Adult infusion 2 mcg/min (05/14/12 1600)   PRN Meds:.acetaminophen, fentaNYL, ondansetron (ZOFRAN) IV    CMP     Component Value Date/Time   NA 142 05/16/2012 0420   K 4.0 05/16/2012 0420   CL 108 05/16/2012 0420   CO2 28 05/16/2012 0420   GLUCOSE 101* 05/16/2012 0420   BUN 25* 05/16/2012 0420   CREATININE 1.05 05/16/2012 0420   CALCIUM 8.0* 05/16/2012 0420   PROT 4.8* 05/14/2012 0500   ALBUMIN 1.9* 05/14/2012 0500   AST 60* 05/14/2012 0500   ALT 39 05/14/2012 0500   ALKPHOS 70 05/14/2012 0500   BILITOT 0.3 05/14/2012 0500   GFRNONAA 66* 05/16/2012 0420   GFRAA 77* 05/16/2012 0420    CBG (last 3)   Basename 05/16/12 0804 05/16/12 0414 05/16/12  GLUCAP 106* 78 111*     Intake/Output Summary (Last 24 hours) at 05/16/12 1203 Last data filed at 05/16/12 1000  Gross per 24 hour  Intake 1182.5 ml  Output   1481 ml  Net -298.5 ml    Weight Status:  142 lbs, trending up   Re-estimated needs:  1600-1800 kcal, 80-95 gm protein   Nutrition Dx:  Inadequate oral intake related to poor appetite as evidenced by pt report   Goal: Meet >/=90% estimated nutrition needs with EN. - no longer applicable New Goal: PO intake to meet >/=90% estimated nutrition needs  Monitor: PO intake, weight,  vent status, labs   Clarene Duke RD, LDN Pager [phone] After Hours pager [phone]

## 2012-05-16 NOTE — Progress Notes (Signed)
EKG shows acute changes and stemi per machine reading. As compared to yesterday's EKG, slight enlargement but presumable is the gain. Dr Shirlee Latch showed and agrees. Pt denies CP, SOB, or any other difficulties.

## 2012-05-17 DIAGNOSIS — I5021 Acute systolic (congestive) heart failure: Secondary | ICD-10-CM | POA: Diagnosis present

## 2012-05-17 DIAGNOSIS — I2699 Other pulmonary embolism without acute cor pulmonale: Secondary | ICD-10-CM | POA: Clinically undetermined

## 2012-05-17 DIAGNOSIS — J9600 Acute respiratory failure, unspecified whether with hypoxia or hypercapnia: Secondary | ICD-10-CM

## 2012-05-17 DIAGNOSIS — G931 Anoxic brain damage, not elsewhere classified: Secondary | ICD-10-CM

## 2012-05-17 DIAGNOSIS — R630 Anorexia: Secondary | ICD-10-CM | POA: Diagnosis present

## 2012-05-17 DIAGNOSIS — R5381 Other malaise: Secondary | ICD-10-CM | POA: Diagnosis present

## 2012-05-17 DIAGNOSIS — N039 Chronic nephritic syndrome with unspecified morphologic changes: Secondary | ICD-10-CM | POA: Diagnosis present

## 2012-05-17 DIAGNOSIS — D631 Anemia in chronic kidney disease: Secondary | ICD-10-CM | POA: Diagnosis present

## 2012-05-17 LAB — BASIC METABOLIC PANEL
Calcium: 8.6 mg/dL (ref 8.4–10.5)
GFR calc Af Amer: 74 mL/min — ABNORMAL LOW (ref >90)
GFR calc non Af Amer: 63 mL/min — ABNORMAL LOW (ref >90)
Potassium: 3.3 mEq/L — ABNORMAL LOW (ref 3.5–5.1)
Sodium: 141 mEq/L (ref 135–145)

## 2012-05-17 LAB — CBC
Hemoglobin: 9.5 g/dL — ABNORMAL LOW (ref 13.0–17.0)
Platelets: 219 10*3/uL (ref 150–400)
RBC: 3.01 MIL/uL — ABNORMAL LOW (ref 4.22–5.81)
WBC: 6.2 10*3/uL (ref 4.0–10.5)

## 2012-05-17 LAB — PROTIME-INR
INR: 1.25 (ref 0.00–1.49)
Prothrombin Time: 15.5 seconds — ABNORMAL HIGH (ref 11.6–15.2)

## 2012-05-17 LAB — HEPARIN LEVEL (UNFRACTIONATED): Heparin Unfractionated: 0.47 IU/mL (ref 0.30–0.70)

## 2012-05-17 MED ORDER — POTASSIUM CHLORIDE CRYS ER 20 MEQ PO TBCR
40.0000 meq | EXTENDED_RELEASE_TABLET | Freq: Once | ORAL | Status: AC
  Administered 2012-05-17: 40 meq via ORAL

## 2012-05-17 MED ORDER — FUROSEMIDE 40 MG PO TABS
40.0000 mg | ORAL_TABLET | Freq: Every day | ORAL | Status: DC
  Administered 2012-05-18: 40 mg via ORAL
  Filled 2012-05-17: qty 1

## 2012-05-17 MED ORDER — CARVEDILOL 3.125 MG PO TABS
3.1250 mg | ORAL_TABLET | Freq: Two times a day (BID) | ORAL | Status: DC
  Administered 2012-05-17 – 2012-05-20 (×6): 3.125 mg via ORAL
  Filled 2012-05-17 (×10): qty 1

## 2012-05-17 MED ORDER — POTASSIUM CHLORIDE CRYS ER 20 MEQ PO TBCR
40.0000 meq | EXTENDED_RELEASE_TABLET | ORAL | Status: AC
  Administered 2012-05-17: 40 meq via ORAL
  Filled 2012-05-17 (×2): qty 2

## 2012-05-17 MED ORDER — WARFARIN SODIUM 6 MG PO TABS
6.0000 mg | ORAL_TABLET | Freq: Once | ORAL | Status: AC
  Administered 2012-05-17: 6 mg via ORAL
  Filled 2012-05-17: qty 1

## 2012-05-17 NOTE — Progress Notes (Signed)
ANTICOAGULATION CONSULT NOTE - Follow Up Consult  Pharmacy Consult for heparin Indication: pulmonary embolus  Allergies  Allergen Reactions  . Cold Medicine Plus (Chlorphen-Pseudoephed-Apap)     COLD MEDICATIONS MAKE HIM "LOOPY"   Patient Measurements: Height: 5\' 9"  (175.3 cm) Weight: 145 lb 4.5 oz (65.9 kg) IBW/kg (Calculated) : 70.7  Heparin Dosing Weight: 65kg  Vital Signs: Temp: 98.6 F (37 C) (12/10 0420) Temp src: Oral (12/10 0420) BP: 100/65 mmHg (12/10 0420) Pulse Rate: 87  (12/10 0420)  Labs:  Basename 05/17/12 0520 05/16/12 0747 05/16/12 0420 05/16/12 0147 05/15/12 1947 05/15/12 1600 05/15/12 1025  HGB 9.5* -- 9.2* -- -- -- --  HCT 28.2* -- 27.8* -- 26.4* -- --  PLT 219 -- 219 -- 224 -- --  APTT -- -- -- -- -- -- --  LABPROT 15.5* -- -- -- -- -- --  INR 1.25 -- -- -- -- -- --  HEPARINUNFRC 0.47 -- 0.36 -- -- -- 0.37  CREATININE 1.09 -- 1.05 -- -- 0.99 --  CKTOTAL -- -- -- -- -- -- --  CKMB -- -- -- -- -- -- --  TROPONINI -- 14.16* -- 15.02* 13.28* -- --   Estimated Creatinine Clearance: 52.9 ml/min (by C-G formula based on Cr of 1.09).  Assessment: Patient is a 78 y.o. M on heparin for a new PE  Heparin level is within desired goal range at 0.47 IU/ml this morning.  INR is 1.25 on day 2 of overlap (coumadin/heparin). His CBC is stable and he is without noted bleeding complications.    Goal of Therapy:  Heparin level 0.3-0.7 units/ml Monitor platelets by anticoagulation protocol: Yes   Plan:  1)   Will continue current Heparin at 1000 units/hr 2)  Coumadn 6mg  po x1 3) Daily PT/INR and heparin level  Harland German, Pharm D 05/17/2012 8:17 AM

## 2012-05-17 NOTE — Consult Note (Signed)
Physical Medicine and Rehabilitation Consult Reason for Consult: Cardiac arrest with encephalopathy Referring Physician: TH   HPI: Louis Reid is a 78 y.o. male with history of CAD, recent admission for SBO with lysis of adhesions, who was admitted on 05/10/12 past witnessed cardiac arrest. Bystander CPR X 10 mins , AED shock X3. Was noted to be in atrial fibrillation and intubated in ED. He was taken to cath lab and underwent PTCA with thrombectomy of occluded LAD by Dr. Clifton James. He developed hematuria/bloody oral secretions with intermittent VT. Hypokalemia supplemented and integrelin discontinued.  CT head done due to lethargy and showed no acute changes. Was started on IV antibiotics for leucocytosis.  CT chest with tiny PE in lingula and CT abdomen with distended GB with mild ascites and diffuse body wall edema. Started on heparin drip for PE. BLE dopplers negative for DVT.  Tolerated extubation 12/08 and encephalopathy slowly resolving. Diet initiated 12/9 due to improvement in mentation and no s/s of aspiration. Patient noted to have difficulty following commands with short term memory deficits as well as balance deficits with impaired mobility. MD, ST recommending CIR.   Review of Systems  HENT: Positive for hearing loss. Negative for neck pain.   Eyes: Negative for blurred vision and double vision.  Respiratory: Negative for cough and shortness of breath.   Cardiovascular: Negative for chest pain and palpitations.  Gastrointestinal: Negative for abdominal pain.  Genitourinary: Negative for urgency and frequency.  Musculoskeletal: Negative for myalgias and back pain.  Neurological: Negative for headaches.  Psychiatric/Behavioral: The patient has insomnia.    Past Medical History  Diagnosis Date  . Acute myocardial infarction of anterolateral wall, episode of care unspecified   . Coronary atherosclerosis of artery bypass graft   . Sinoatrial node dysfunction   . HTN (hypertension)    . HLD (hyperlipidemia)   . Depression   . History of kidney stones   . Congenital funnel chest   . Inguinal hernia   . Osteoporosis    Past Surgical History  Procedure Date  . Coronary artery bypass graft 1996  . Inguinal hernia repair 1996    right, hx  . Transurethral resection of prostate   . Ptca     percutaneous transluminal coronary intervention and brachy therapy, Bruce R. Juanda Chance, MD. EF 60%  . Laparotomy 04/27/2012    Procedure: EXPLORATORY LAPAROTOMY;  Surgeon: Cherylynn Ridges, MD;  Location: [facility] OR;  Service: General;  Laterality: N/A;  . Esophagogastroduodenoscopy 05/08/2012    Procedure: ESOPHAGOGASTRODUODENOSCOPY (EGD);  Surgeon: Charna Elizabeth, MD;  Location: [facility] ENDOSCOPY;  Service: Endoscopy;  Laterality: N/A;  Gunnar Fusi put on for Dr. Loreta Ave , Dr. Loreta Ave will call if she wants another time   Family History  Problem Relation Age of Onset  . Colon cancer Brother   . Diabetes Brother   . Heart disease Father   . Heart disease Brother   . Other Mother     brain tumor   Social History: Lives alone. Retired Production designer, theatre/television/film. Independent and active prior to abdominal surgery. Son can provided supervision for few days past discharge.   He reports that he has quit smoking. He has never used smokeless tobacco. He reports that he does not drink alcohol or use illicit drugs.  Allergies  Allergen Reactions  . Cold Medicine Plus (Chlorphen-Pseudoephed-Apap)     COLD MEDICATIONS MAKE HIM "LOOPY"   Medications Prior to Admission  Medication Sig Dispense Refill  . aspirin 81 MG tablet Take 1 tablet (81 mg  total) by mouth daily.      . benazepril (LOTENSIN) 10 MG tablet Take 1 tablet (10 mg total) by mouth daily.  90 tablet  3  . docusate sodium (COLACE) 100 MG capsule Take 100 mg by mouth daily as needed. constipation      . Multiple Vitamin (MULTIVITAMIN WITH MINERALS) TABS Take 1 tablet by mouth daily.      . pantoprazole (PROTONIX) 40 MG tablet Take 1 tablet (40 mg total) by mouth 2 (two) times  daily before a meal.  60 tablet  3  . sertraline (ZOLOFT) 50 MG tablet Take 50 mg by mouth daily.      . simvastatin (ZOCOR) 20 MG tablet TAKE 1 TABLET BY MOUTH AT BEDTIME  90 tablet  3  . sucralfate (CARAFATE) 1 GM/10ML suspension Take 10 mLs (1 g total) by mouth 4 (four) times daily -  with meals and at bedtime.  420 mL  3    Home: Home Living Lives With: Alone Available Help at Discharge: Family;Available PRN/intermittently (maybe 24/7 - son) Type of Home: House Home Access: Stairs to enter [employer] of Steps: 3-4 Entrance Stairs-Rails: None Home Layout: One level Bathroom Shower/Tub: Tub only Firefighter: Standard Home Adaptive Equipment: None  Functional History: Prior Function Able to Take Stairs?: Yes Driving: Yes Vocation: Retired Comments: states he was living in [employer] Functional Status:  Mobility: Bed Mobility Bed Mobility: Rolling Left;Left Sidelying to Sit;Sitting - Scoot to Edge of Bed Rolling Left: 6: Modified independent (Device/Increase time);With rail Left Sidelying to Sit: 4: Min assist;HOB flat Sitting - Scoot to Edge of Bed: 6: Modified independent (Device/Increase time) Transfers Transfers: Sit to Stand;Stand to Sit Sit to Stand: With upper extremity assist;From bed;3: Mod assist Stand to Sit: 4: Min assist;To chair/3-in-1;With upper extremity assist Ambulation/Gait Ambulation/Gait Assistance: 3: Mod assist Ambulation Distance (Feet): 20 Feet Assistive device: Rolling walker Ambulation/Gait Assistance Details: cues for correct DME use, posture, and safety.  Pt had difficulty remembering cues, req repeat with each step.  Gait Pattern: Step-to pattern;Decreased stride length;Trunk flexed Gait velocity: decr General Gait Details: trunk flexion incr over time, req cues to positionally readjust Stairs: No Wheelchair Mobility Wheelchair Mobility: No  ADL:    Cognition: Cognition Arousal/Alertness: Awake/alert Orientation  Level: Oriented to person;Oriented to place Cognition Overall Cognitive Status: Impaired Area of Impairment: Attention;Memory;Following commands;Awareness of errors;Awareness of deficits;Problem solving Arousal/Alertness: Awake/alert Orientation Level: Oriented X4 / Intact Behavior During Session: [facility] for tasks performed Current Attention Level: Focused Attention - Other Comments: Pt asked "like this?" "is this correct?" with every step during ambulation from bed to door to chair Memory:  (Decr recall of recent event that brought to hospital) Following Commands: Follows one step commands consistently;Follows multi-step commands inconsistently Awareness of Errors: Assistance required to identify errors made;Assistance required to correct errors made Problem Solving: cues for getting out of bed  Blood pressure 102/71, pulse 96, temperature 97.3 F (36.3 C), temperature source Oral, resp. rate 18, height 5\' 9"  (1.753 m), weight 52.5 kg (115 lb 11.9 oz), SpO2 95.00%. Physical Exam  Nursing note and vitals reviewed. Constitutional: He is oriented to person, place, and time. He appears well-developed and well-nourished.  HENT:  Head: Normocephalic and atraumatic.  Eyes: Pupils are equal, round, and reactive to light.  Neck: Normal range of motion.  Cardiovascular: Normal rate and regular rhythm.   Pulmonary/Chest: Effort normal and breath sounds normal.  Abdominal: Soft. Bowel sounds are normal.  Neurological: He is alert and oriented  to person, place, and time. He has normal reflexes.       Generalized weakness. Ue 4/5. LE 3/5 prox to 4/5 distally. A bit impulsive. Decreased attention. Fair memory and insight.  Skin:       Old scarring noted on abdomen.   Psychiatric: He has a normal mood and affect. His speech is normal and behavior is normal. Thought content normal. He exhibits abnormal recent memory.    Results for orders placed during the hospital encounter of 05/10/12 (from the past  24 hour(s))  HEPARIN LEVEL (UNFRACTIONATED)     Status: Normal   Collection Time   05/17/12  5:20 AM      Component Value Range   Heparin Unfractionated 0.47  0.30 - 0.70 IU/mL  BASIC METABOLIC PANEL     Status: Abnormal   Collection Time   05/17/12  5:20 AM      Component Value Range   Sodium 141  135 - 145 mEq/L   Potassium 3.3 (*) 3.5 - 5.1 mEq/L   Chloride 105  96 - 112 mEq/L   CO2 28  19 - 32 mEq/L   Glucose, Bld 99  70 - 99 mg/dL   BUN 22  6 - 23 mg/dL   Creatinine, Ser 7.82  0.50 - 1.35 mg/dL   Calcium 8.6  8.4 - 95.6 mg/dL   GFR calc non Af Amer 63 (*) >90 mL/min   GFR calc Af Amer 74 (*) >90 mL/min  CBC     Status: Abnormal   Collection Time   05/17/12  5:20 AM      Component Value Range   WBC 6.2  4.0 - 10.5 K/uL   RBC 3.01 (*) 4.22 - 5.81 MIL/uL   Hemoglobin 9.5 (*) 13.0 - 17.0 g/dL   HCT 21.3 (*) 08.6 - 57.8 %   MCV 93.7  78.0 - 100.0 fL   MCH 31.6  26.0 - 34.0 pg   MCHC 33.7  30.0 - 36.0 g/dL   RDW 46.9  62.9 - 52.8 %   Platelets 219  150 - 400 K/uL  PROTIME-INR     Status: Abnormal   Collection Time   05/17/12  5:20 AM      Component Value Range   Prothrombin Time 15.5 (*) 11.6 - 15.2 seconds   INR 1.25  0.00 - 1.49   No results found.  Assessment/Plan: Diagnosis: deconditioning after cardiac arrest, PE, multiple medical, anoxic encephalopathy 1. Does the need for close, 24 hr/day medical supervision in concert with the patient's rehab needs make it unreasonable for this patient to be served in a less intensive setting? Yes 2. Co-Morbidities requiring supervision/potential complications: htn, vfib,  3. Due to bladder management, bowel management, safety, skin/wound care, disease management, medication administration, pain management and patient education, does the patient require 24 hr/day rehab nursing? Yes 4. Does the patient require coordinated care of a physician, rehab nurse, PT (1-2 hrs/day, 5 days/week), OT (1-2 hrs/day, 5 days/week) and SLP (1-2  hrs/day, 5 days/week) to address physical and functional deficits in the context of the above medical diagnosis(es)? Yes Addressing deficits in the following areas: balance, endurance, locomotion, strength, transferring, bowel/bladder control, bathing, dressing, feeding, grooming, toileting, cognition and psychosocial support 5. Can the patient actively participate in an intensive therapy program of at least 3 hrs of therapy per day at least 5 days per week? Yes 6. The potential for patient to make measurable gains while on inpatient rehab is excellent 7. Anticipated functional  outcomes upon discharge from inpatient rehab are supervision with PT, supervision to min assist with OT, supervision to mod I with SLP. 8. Estimated rehab length of stay to reach the above functional goals is: 7-10 days 9. Does the patient have adequate social supports to accommodate these discharge functional goals? Yes 10. Anticipated D/C setting: Home 11. Anticipated post D/C treatments: HH therapy 12. Overall Rehab/Functional Prognosis: excellent  RECOMMENDATIONS: This patient's condition is appropriate for continued rehabilitative care in the following setting: CIR Patient has agreed to participate in recommended program. Yes Note that insurance prior authorization may be required for reimbursement for recommended care.  Comment:Rehab RN to follow up.   Ivory Broad, MD     05/17/2012

## 2012-05-17 NOTE — Progress Notes (Signed)
Nutrition Consult/Follow-up  Intervention:    Continue Ensure Complete supplement 2 times daily (350 kcals, 13 gm protein per 8 fl oz bottle)  If PO intake does not improve, recommend initiating short-term EN support  RD to follow for nutrition care plan  Assessment:   RD consult for assessment of nutrition requirements/status.  Initial nutrition assessment completed 12/4.  PO intake very poor at 10-15% per flowsheet records.  He states "I'm just not hungry;" taking "a little bit" of Ensure Complete supplements.   Diet Order:  Dysphagia 3, thin liquids  Meds: Scheduled Meds:   . antiseptic oral rinse  1 application Mouth Rinse QID  . aspirin  81 mg Oral Daily  . atorvastatin  80 mg Per Tube q1800  . carvedilol  3.125 mg Oral BID WC  . chlorhexidine  15 mL Mouth/Throat BID  . feeding supplement  237 mL Oral BID BM  . furosemide  40 mg Oral Daily  . multivitamin  5 mL Per Tube Daily  . pantoprazole  40 mg Oral Q1200  . potassium chloride  40 mEq Oral Q4H  . Ticagrelor  90 mg Oral BID  . [COMPLETED] warfarin  6 mg Oral ONCE-1800  . warfarin  6 mg Oral ONCE-1800  . warfarin   Does not apply Once  . Warfarin - Pharmacist Dosing Inpatient   Does not apply q1800  . [DISCONTINUED] furosemide  40 mg Oral BID   Continuous Infusions:   . sodium chloride 10 mL/hr at 05/14/12 1500  . heparin 1,000 Units/hr (05/16/12 2018)  . [DISCONTINUED] sodium chloride 10 mL/hr at 05/14/12 1800  . [DISCONTINUED] dextrose 10 mL/hr at 05/15/12 0820   PRN Meds:.acetaminophen, fentaNYL, ondansetron (ZOFRAN) IV   CMP     Component Value Date/Time   NA 141 05/17/2012 0520   K 3.3* 05/17/2012 0520   CL 105 05/17/2012 0520   CO2 28 05/17/2012 0520   GLUCOSE 99 05/17/2012 0520   BUN 22 05/17/2012 0520   CREATININE 1.09 05/17/2012 0520   CALCIUM 8.6 05/17/2012 0520   PROT 4.8* 05/14/2012 0500   ALBUMIN 1.9* 05/14/2012 0500   AST 60* 05/14/2012 0500   ALT 39 05/14/2012 0500   ALKPHOS 70 05/14/2012 0500    BILITOT 0.3 05/14/2012 0500   GFRNONAA 63* 05/17/2012 0520   GFRAA 74* 05/17/2012 0520    CBG (last 3)   Basename 05/16/12 0804 05/16/12 0414 05/16/12  GLUCAP 106* 78 111*     Intake/Output Summary (Last 24 hours) at 05/17/12 1433 Last data filed at 05/17/12 1230  Gross per 24 hour  Intake    250 ml  Output   1153 ml  Net   -903 ml    Weight Status: 52.5 kg (12/10) -- down 30 lbs since 12/8 -- question accuracy  Estimated needs: 1600-1800 kcal, 80-95 gm protein   Nutrition Dx: Inadequate oral intake related to poor appetite as evidenced by PO intake 10-15%, ongoing  Goal: Oral intake with meals & supplements to meet >/= 90% of estimated nutrition needs, unmet  Monitor: PO intake, weight, labs, I/O's  Kirkland Hun, RD, LDN Pager #: [phone] After-Hours Pager #: [phone]

## 2012-05-17 NOTE — Progress Notes (Signed)
[facility] Progress Note [place] TEAM 1 - Stepdown/ICU TEAM   Louis Reid ZOX:[id number] DOB: 1932/12/01 DOA: 05/10/2012 PCP: [name], MD  Brief narrative: 78 year old male patient admitted on 06/06/2012 with ventricular fibrillation cardiac arrest secondary cardiac ischemia. He required mechanical ventilation and was started on the hypothermia protocol. CT of the chest was completed on 05/12/2012 and revealed a small pulmonary embolism. Lower sternum he Dopplers revealed no evidence of DVT. Patient had recently been hospitalized for 11 days and was discharged 05/09/2012 after small bowel obstruction that required support her laparotomy with lysis of adhesions. Patient eventually underwent cardiac catheterization with intervention to an occluded LAD. He stabilized enough to be weaned off of pressors and was extubated and deemed appropriate to transfer to telemetry on 05/16/2012. Team 1 assumed care of this patient on 05/17/2012  SIGNIFICANT EVENTS:  12/3 Bought to [facility] ED after suffering VF cardiac arrest and ROSC after brief CPR and shocks x 3. Hypothermia protocol started.  12/3- cath- PTCA (balloon angioplasty) only proximal/mid LAD Thrombectomy occluded LAD  12/4- hypothermia continuous, oozing mouth, urine  12/5-acidosis, bleeding resolved, shock worse  12/5 CT chest - lingula PE, small efusions, Ct head - neg acute  12/5 CT abndo/eplvis - Distended gallbladder, containing sludge versus vicariously<BR>excreted contrast.<BR>3. Resolution of small bowel dilatation since prior exam. No<BR>evidence of abscess  12/6- follows commands on rewarm  12/7- low dose pressor needs  12/8- neg 2.1 liters   Assessment/Plan: Principal Problem:  *Ventricular fibrillation arrest due to: A) Coronary artery disease s/p PTCA LAD--History of coronary artery bypass surgery *Management by cardiology  Active Problems:  Acute respiratory failure with hypoxia 2/2 cardiac  arrest *Resolved   Cardiogenic shock *Resolved   HYPERTENSION *Controlled-management by cardiology   Acute encephalopathy 2/2 cardiac arrest *Resolving, patient still seems somewhat confused and has no recollection that he actually had surgery on the previous admission   Acute systolic congestive heart failure, NYHA class 3/EF 20-25% *Compensated-management per cardiology  Acute pulmonary embolism-small *Lower extremity Dopplers are negative and patient had a tiny focal pulmonary embolism *Dr. Butler Denmark has discussed with pulmonary critical care medicine and per Dr Tyson Alias,  patient will likely require anticoagulation for 3 months *Pharmacy managing heparin and Coumadin   Physical deconditioning *PT OT evaluations in process *See if patient qualifies for inpatient rehabilitation services   HYPERLIPIDEMIA *Continue statin   Hyperglycemia *Check Hemoglobin A1c   Anemia of critical illness *Follow hemoglobin- would only transfuse if hemoglobin less than or equal to 7.5   Anorexia *Nutrition evaluation   DVT prophylaxis: On full dose IV heparin for PE Code Status: Partial code with no CPR and no defibrillation Family Communication: Spoke with patient Disposition Plan: Remain in telemetry  Consultants: Cardiology Pulmonary  Procedures: 12/3 [place] CVL >>> 12/3 cardiac catheterization with balloon angioplasty to proximal and mid LAD  CULTURES:  12/5 BC>>>  12/5 sputum>>>negative  Antibiotics: vanc 12/5>>>12/9  Zosyn 12/5>>>12/9  myco 12/5>>>12/6   HPI/Subjective: - alert but seems confused and having trouble recalling short-term events especially events even preceding this admission. Currently denies chest pain or shortness of breath. Endorses generalized malaise and significant anorexia. States before this admission felt he was eating adequately.   Objective: Blood pressure 102/71, pulse 96, temperature 97.3 F (36.3 C), temperature source Oral, resp. rate  18, height 5\' 9"  (1.753 m), weight 52.5 kg (115 lb 11.9 oz), SpO2 95.00%.  Intake/Output Summary (Last 24 hours) at 05/17/12 1339 Last data filed at 05/17/12 1230  Gross per  24 hour  Intake    250 ml  Output   1153 ml  Net   -903 ml     Exam: General: No acute respiratory distress, cachectic and frail appearing man Lungs: Clear to auscultation bilaterally without wheezes or crackles Cardiovascular: Regular rate and rhythm without murmur gallop or rub normal S1 and S2, no JVD and no peripheral edema Abdomen: Nontender, nondistended, soft, bowel sounds positive, no rebound, no ascites, no appreciable mass Musculoskeletal: No significant cyanosis, clubbing of bilateral lower extremities Neurological: Patient is alert and oriented to name and place although seems somewhat confused as to situation time and year, moves all extremities x4, cranial nerves 2-12 are grossly intact, gait and ambulation not assessed  Data Reviewed: Basic Metabolic Panel:  Lab 05/17/12 1610 05/16/12 0420 05/15/12 1947 05/15/12 1600 05/15/12 1140 05/15/12 0435 05/13/12 0506 05/12/12 1345 05/12/12 0357 05/11/12 05/10/12 1835  NA 141 142 -- 144 145 142 -- -- -- -- --  K 3.3* 4.0 -- 4.1 3.2* 3.0* -- -- -- -- --  CL 105 108 -- 110 107 109 -- -- -- -- --  CO2 28 28 -- 27 25 26  -- -- -- -- --  GLUCOSE 99 101* -- 109* 109* 159* -- -- -- -- --  BUN 22 25* -- 26* 25* 29* -- -- -- -- --  CREATININE 1.09 1.05 -- 0.99 0.85 1.01 -- -- -- -- --  CALCIUM 8.6 8.0* -- 7.3* 6.8* 7.4* -- -- -- -- --  MG -- 2.0 1.9 -- -- -- 2.1 2.3 1.4* -- --  PHOS -- 1.5* -- -- -- -- -- -- -- 1.8* 4.5   Liver Function Tests:  Lab 05/14/12 0500 05/13/12 0506 05/12/12 0411 05/10/12 1835  AST 60* 111* 292* 46*  ALT 39 50 78* 23  ALKPHOS 70 68 69 67  BILITOT 0.3 0.2* 0.2* 0.2*  PROT 4.8* 4.5* 5.1* 5.3*  ALBUMIN 1.9* 1.9* 2.3* 2.7*   No results found for this basename: LIPASE:5,AMYLASE:5 in the last 168 hours No results found for this  basename: AMMONIA:5 in the last 168 hours CBC:  Lab 05/17/12 0520 05/16/12 0420 05/15/12 1947 05/15/12 0435 05/14/12 0500 05/13/12 0506  WBC 6.2 8.6 10.9* 15.5* 18.2* --  NEUTROABS -- 7.6 9.9* 14.3* 17.0* 12.1*  HGB 9.5* 9.2* 8.8* 9.4* 10.4* --  HCT 28.2* 27.8* 26.4* 28.5* 31.4* --  MCV 93.7 95.9 94.6 96.0 93.7 --  PLT 219 219 224 250 322 --   Cardiac Enzymes:  Lab 05/16/12 0747 05/16/12 0147 05/15/12 1947 05/11/12 0617 05/11/12  CKTOTAL -- -- -- -- --  CKMB -- -- -- -- --  CKMBINDEX -- -- -- -- --  TROPONINI 14.16* 15.02* 13.28* >20.00* >20.00*   BNP (last 3 results) No results found for this basename: PROBNP:3 in the last 8760 hours CBG:  Lab 05/16/12 0804 05/16/12 0414 05/16/12 05/15/12 2051 05/15/12 1614  GLUCAP 106* 78 111* 104* 108*    Recent Results (from the past 240 hour(s))  CULTURE, RESPIRATORY     Status: Normal   Collection Time   05/11/12  9:40 AM      Component Value Range Status Comment   Specimen Description OTHER   Final    Special Requests ETT SUCTION   Final    Gram Stain     Final    Value: MODERATE WBC PRESENT, PREDOMINANTLY PMN     FEW SQUAMOUS EPITHELIAL CELLS PRESENT     MODERATE GRAM NEGATIVE COCCI  FEW GRAM NEGATIVE RODS   Culture Non-Pathogenic Oropharyngeal-type Flora Isolated.   Final    Report Status 05/13/2012 FINAL   Final   CULTURE, BLOOD (ROUTINE X 2)     Status: Normal (Preliminary result)   Collection Time   05/11/12 11:29 AM      Component Value Range Status Comment   Specimen Description BLOOD LEFT ARM   Final    Special Requests BOTTLES DRAWN AEROBIC AND ANAEROBIC 10CC   Final    Culture  Setup Time 05/11/2012 21:55   Final    Culture     Final    Value:        BLOOD CULTURE RECEIVED NO GROWTH TO DATE CULTURE WILL BE HELD FOR 5 DAYS BEFORE ISSUING A FINAL NEGATIVE REPORT   Report Status PENDING   Incomplete   CULTURE, BLOOD (ROUTINE X 2)     Status: Normal (Preliminary result)   Collection Time   05/11/12 11:39 AM       Component Value Range Status Comment   Specimen Description BLOOD LEFT FOREARM   Final    Special Requests BOTTLES DRAWN AEROBIC ONLY 10CC   Final    Culture  Setup Time 05/11/2012 21:55   Final    Culture     Final    Value:        BLOOD CULTURE RECEIVED NO GROWTH TO DATE CULTURE WILL BE HELD FOR 5 DAYS BEFORE ISSUING A FINAL NEGATIVE REPORT   Report Status PENDING   Incomplete      Studies:  Recent x-ray studies have been reviewed in detail by the Attending Physician  Scheduled Meds:  Reviewed in detail by the Attending Physician   Junious Silk, ANP [facility] Office  [phone] Pager [phone]  On-Call/Text Page:      [email or web address]      password [email or web address]  If 7PM-7AM, please contact night-coverage [email or web address] Password [email or web address] 05/17/2012, 1:39 PM   LOS: 7 days   I have examined the patient, reviewed the chart and modified the above note which I agree with.   Calvert Cantor, MD [phone]

## 2012-05-17 NOTE — Evaluation (Signed)
Kaven Cumbie Ingold,PT Acute Rehabilitation [phone] (pager)  

## 2012-05-17 NOTE — Evaluation (Signed)
Physical Therapy Evaluation Patient Details Name: Louis Reid MRN: [id number] DOB: 21-Feb-1933 Today's Date: 05/17/2012 Time: 4098-1191 PT Time Calculation (min): 33 min  PT Assessment / Plan / Recommendation Clinical Impression  Pt admitted with cardiac arrest, presents today with decr mobility, strength, and activity tolerance.  Incr difficulty following directions, remembering cues and history of his situation.  Will benefit from skilled phyiscal therapy to address these limitations and maximize his mobility and independence in the acute care setting.  Recommend SNF when d/c.     PT Assessment  Patient needs continued PT services    Follow Up Recommendations  SNF       Barriers to Discharge Decreased caregiver support      Equipment Recommendations  Rolling walker with 5" wheels       Frequency Min 3X/week    Precautions / Restrictions Precautions Precautions: Fall Precaution Comments: posterior lean in sitting and with sit/stand Restrictions Weight Bearing Restrictions: No   Pertinent Vitals/Pain VSS, denies pain.      Mobility  Bed Mobility Bed Mobility: Rolling Left;Left Sidelying to Sit;Sitting - Scoot to Edge of Bed Rolling Left: 6: Modified independent (Device/Increase time);With rail Left Sidelying to Sit: 4: Min assist;HOB flat Sitting - Scoot to Edge of Bed: 6: Modified independent (Device/Increase time) Details for Bed Mobility Assistance: req increased time, appears to have decr coordination with movement  Transfers Transfers: Sit to Stand;Stand to Sit Sit to Stand: With upper extremity assist;From bed;3: Mod assist Stand to Sit: 4: Min assist;To chair/3-in-1;With upper extremity assist Details for Transfer Assistance: req cues for hand placement, controlled movement, and balance- pt had posterior lean upon standing Ambulation/Gait Ambulation/Gait Assistance: 3: Mod assist Ambulation Distance (Feet): 20 Feet Assistive device: Rolling  walker Ambulation/Gait Assistance Details: cues for correct DME use, posture, and safety.  Pt had difficulty remembering cues, req repeat with each step.  Gait Pattern: Step-to pattern;Decreased stride length;Trunk flexed Gait velocity: decr General Gait Details: trunk flexion incr over time, req cues to positionally readjust Stairs: No Wheelchair Mobility Wheelchair Mobility: No       Exercises General Exercises - Lower Extremity Ankle Circles/Pumps: AROM;Both;10 reps;Seated Quad Sets: Strengthening;10 reps;Both;Seated Long Arc Quad: AROM;5 reps;Both;Seated   PT Diagnosis: Generalized weakness  PT Problem List: Decreased strength;Decreased activity tolerance;Decreased balance;Decreased range of motion;Decreased mobility;Decreased cognition;Decreased knowledge of use of DME;Decreased safety awareness PT Treatment Interventions: DME instruction;Gait training;Functional mobility training;Therapeutic activities;Therapeutic exercise;Balance training;Neuromuscular re-education   PT Goals Acute Rehab PT Goals PT Goal Formulation: With patient Time For Goal Achievement: 05/31/12 Potential to Achieve Goals: Good Pt will go Supine/Side to Sit: with modified independence;with HOB 0 degrees PT Goal: Supine/Side to Sit - Progress: Goal set today Pt will go Sit to Stand: with modified independence PT Goal: Sit to Stand - Progress: Goal set today Pt will go Stand to Sit: with modified independence PT Goal: Stand to Sit - Progress: Goal set today Pt will Transfer Bed to Chair/Chair to Bed: with modified independence PT Transfer Goal: Bed to Chair/Chair to Bed - Progress: Goal set today Pt will Stand: with modified independence;with bilateral upper extremity support;3 - 5 min PT Goal: Stand - Progress: Goal set today Pt will Ambulate: 51 - 150 feet;with modified independence;with rolling walker PT Goal: Ambulate - Progress: Goal set today  Visit Information  Last PT Received On:  05/17/12 Assistance Needed: +1    Subjective Data  Subjective: "I believe I had a heart attack but I don't remember anything."   Prior Functioning  Home Living Lives With: Alone Available Help at Discharge: Family;Available PRN/intermittently (maybe 24/7 - son) Type of Home: House Home Access: Stairs to enter [employer] of Steps: 3-4 Entrance Stairs-Rails: None Home Layout: One level Bathroom Shower/Tub: Tub only Firefighter: Standard Home Adaptive Equipment: None Prior Function Level of Independence: Independent Able to Take Stairs?: Yes Driving: Yes Vocation: Retired Comments: states he was living in [employer] Communication: No difficulties Dominant Hand: Right    Cognition  Overall Cognitive Status: Impaired Area of Impairment: Attention;Memory;Following commands;Awareness of errors;Awareness of deficits;Problem solving Arousal/Alertness: Awake/alert Orientation Level: Oriented X4 / Intact Behavior During Session: [facility] for tasks performed Current Attention Level: Focused Attention - Other Comments: Pt asked "like this?" "is this correct?" with every step during ambulation from bed to door to chair Memory:  (Decr recall of recent event that brought to hospital) Following Commands: Follows one step commands consistently;Follows multi-step commands inconsistently Awareness of Errors: Assistance required to identify errors made;Assistance required to correct errors made Problem Solving: cues for getting out of bed    Extremity/Trunk Assessment Right Upper Extremity Assessment RUE ROM/Strength/Tone: [facility] for tasks assessed Left Upper Extremity Assessment LUE ROM/Strength/Tone: WFL for tasks assessed Right Lower Extremity Assessment RLE ROM/Strength/Tone: Deficits RLE ROM/Strength/Tone Deficits: Grossly 3/5 RLE Sensation: WFL - Light Touch Left Lower Extremity Assessment LLE ROM/Strength/Tone: Deficits LLE ROM/Strength/Tone Deficits:  grossly 3/5 LLE Sensation: WFL - Light Touch Trunk Assessment Trunk Assessment:  (poor trunk control in sitting during MMT)   Balance Balance Balance Assessed: Yes Static Sitting Balance Static Sitting - Balance Support: Bilateral upper extremity supported;Feet supported Static Sitting - Level of Assistance: 5: Stand by assistance Static Sitting - Comment/# of Minutes: 5-8 min for MMT and donning socks; pt able to don socks req incr time and min A with initiating placement on foot.  posterior lean with difficulty centering Static Standing Balance Static Standing - Balance Support: Bilateral upper extremity supported;During functional activity Static Standing - Level of Assistance: 4: Min assist Static Standing - Comment/# of Minutes: with ambulation, holding RW, pt with many incidences of lob and posterior lean upon standing  End of Session PT - End of Session Equipment Utilized During Treatment: Gait belt Activity Tolerance: Patient limited by fatigue Patient left: in chair;with call bell/phone within reach Nurse Communication: Mobility status       Sharion Balloon 05/17/2012, 11:18 AM Sharion Balloon, SPT Acute Rehab Services [phone]

## 2012-05-17 NOTE — Progress Notes (Signed)
Patient will benefit from an inpt rehab admission if [facility] will approve. I will need an OT eval as well as a ST cognition eval to assist with approval Please order and I will follow up in the morning. [phone]

## 2012-05-17 NOTE — Progress Notes (Signed)
Cardiology Progress Note Patient Name: Louis Reid Date of Encounter: 05/17/2012, 6:42 AM     Subjective  No overnight events. Patient denies chest pain, sob, or leg pain. He complains of lack of appetite. Had a BM yesterday. Has not been out of bed. He is alert and oriented to person, but had difficulty naming where he is or the date. He did not remember why he was in the hospital.    Objective   Telemetry: sinus rhythm 70-80s, no arrhythmias   Medications: . antiseptic oral rinse  1 application Mouth Rinse QID  . aspirin  81 mg Oral Daily  . atorvastatin  80 mg Per Tube q1800  . chlorhexidine  15 mL Mouth/Throat BID  . [COMPLETED] coumadin book   Does not apply Once  . feeding supplement  237 mL Oral BID BM  . furosemide  40 mg Oral BID  . multivitamin  5 mL Per Tube Daily  . pantoprazole  40 mg Oral Q1200  . Ticagrelor  90 mg Oral BID  . [COMPLETED] warfarin  6 mg Oral ONCE-1800  . warfarin   Does not apply Once  . Warfarin - Pharmacist Dosing Inpatient   Does not apply q1800  . [DISCONTINUED] insulin aspart  0-9 Units Subcutaneous Q4H  . [DISCONTINUED] pantoprazole (PROTONIX) IV  40 mg Intravenous QHS  . [DISCONTINUED] piperacillin-tazobactam (ZOSYN)  IV  3.375 g Intravenous Q8H  . [DISCONTINUED] sodium chloride  1,000 mL Intravenous Once   . sodium chloride 10 mL/hr at 05/14/12 1500  . sodium chloride 10 mL/hr at 05/14/12 1800  . dextrose 10 mL/hr at 05/15/12 0820  . heparin 1,000 Units/hr (05/16/12 2018)  . [DISCONTINUED] norepinephrine (LEVOPHED) Adult infusion 2 mcg/min (05/14/12 1600)    Physical Exam: Temp:  [98 F (36.7 C)-98.6 F (37 C)] 98.6 F (37 C) (12/10 0420) Pulse Rate:  [72-87] 87  (12/10 0420) Resp:  [14-18] 18  (12/10 0420) BP: (88-104)/(56-66) 100/65 mmHg (12/10 0420) SpO2:  [95 %-100 %] 99 % (12/10 0420)  General: Pleasant elderly white male, in no acute distress. Head: Normocephalic, atraumatic, sclera non-icteric, nares are without  discharge.  Neck: Supple. Negative for carotid bruits or JVD Lungs: Clear bilaterally to auscultation without wheezes, rales, or rhonchi. Breathing is unlabored. Heart: RRR S1 S2 without murmurs, rubs, or gallops.  Chest: Pectus excavatum Abdomen: Well healing midline surgical incision. Soft, non-tender, non-distended with normoactive bowel sounds. No rebound/guarding. No obvious abdominal masses. Msk:  Strength and tone appear normal for age. Extremities: No edema. No clubbing or cyanosis. Bilat feet warm, distal pulses not palpable. Neuro: Alert and oriented to person. Moves all extremities spontaneously. Psych:  Responds to questions appropriately with a normal affect.   Intake/Output Summary (Last 24 hours) at 05/17/12 0642 Last data filed at 05/17/12 0300  Gross per 24 hour  Intake  232.5 ml  Output   1202 ml  Net -969.5 ml    Labs:  [facility] 05/17/12 0520 05/16/12 0420  NA 141 142  K 3.3* 4.0  CL 105 108  CO2 28 28  GLUCOSE 99 101*  BUN 22 25*  CREATININE 1.09 1.05  CALCIUM 8.6 8.0*  MG -- 2.0  PHOS -- 1.5*   Basename 05/17/12 0520 05/16/12 0420 05/15/12 1947  WBC 6.2 8.6 --  NEUTROABS -- 7.6 9.9*  HGB 9.5* 9.2* --  HCT 28.2* 27.8* --  MCV 93.7 95.9 --  PLT 219 219 --     05/17/2012 05:20  Prothrombin Time 15.5 (H)  INR 1.25   Basename 05/16/12 0747 05/16/12 0147 05/15/12 1947  TROPONINI 14.16* 15.02* 13.28*   Basename  05/11/12 1017   TSH  9.927*     Radiology/Studies:   05/12/2012 - CT HEAD WITHOUT CONTRAST   Findings: There is no acute intracranial hemorrhage, infarction, or mass lesion.  There is mild diffuse cerebral cortical and cerebellar atrophy.  No ventricular dilatation.  There is no brain edema or appreciable small vessel disease.  Osseous structures are normal.  IMPRESSION: Slight atrophy.  Otherwise, normal exam.  No brain edema.     05/12/2012 - CT ANGIOGRAPHY CHEST  Findings: There is a single small pulmonary embolus and artery to the  lingula of the left lung.  No other pulmonary emboli.  There are moderate bilateral pleural effusions with secondary atelectasis in the lower lobes.  The patient has a marked pectus excavatum deformity.  Extensive coronary artery calcification. Previous CABG.  No hilar or mediastinal adenopathy.   No acute osseous abnormality.  Ascites is noted in the upper abdomen.  IMPRESSION:  1.  Tiny pulmonary embolus in the lingula.  No other emboli. 2.  Moderate bilateral pleural effusions.   05/15/2012 - PORTABLE CHEST - 1 VIEW   Findings: Bilateral pleural effusions with associated atelectasis is stable.  The lungs are hyperexpanded.  There is no pneumothorax.  Changes from CABG surgery are stable.  The endotracheal tube, nasogastric tube and left subclavian central venous line remain well positioned and unchanged.  IMPRESSION: No convincing active pulmonary edema.  Bilateral pleural effusions with lung base opacity, most likely atelectasis.    05/09/12 - Cardiac Cath  Hemodynamic Findings:  Central aortic pressure: 103/67  Left ventricular pressure: 104/13/20  Angiographic Findings:  Left main: Mild plaque disease.  Left Anterior Descending Artery: Large caliber vessel with stents noted extending from the proximal vessel into the mid vessel. There is a total occlusion of the proximal LAD within the most proximal segment of the stent. There is no flow into the mid and distal vessel. The diagonal branch fills from the patent vein graft. The LIMA to the mid LAD is known to be atretic by cath in 2003 and was not engaged.  Circumflex Artery: 99% proximal subtotal occlusion, unchanged from cath in 2003. The first and second obtuse marginal branches fill from the patent vein graft.  Right Coronary Artery: Large, dominant artery with 60% ostial stenosis, 70% proximal stenosis and diffuse 50% disease in the mid vessel. The PDA is small in caliber. The posterolateral branch is moderate sized and has mild disease.  Graft  Anatomy:  SVG sequential to OM1/OM2 is patent with mild irregularities in the body of the graft and a prominent valve mid body of graft but no obstructive lesions.  SVG to diagonal is patent with mild irregularities in the mid body of the graft.  SVG to RCA is known to be occluded and was not engaged.  LIMA to mid LAD is known to be atretic and was not engaged.  Left Ventricular Angiogram: LVEF=20% with akinesis of the anterior Firas Guardado and apex.  Impression:  1. 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  2. Salvage PCI with balloon angioplasty and thrombectomy of the occluded LAD  3. Acute anterior STEMI secondary to occluded LAD  4. Cardiac arrest, out of hospital.  5. Severe LV systolic dysfunction  Recommendations: He will be admitted to the CCU  under the care of the PCCM service. Cooling protocol. Will run Integrilin for 18 hours. Will load with Brilinta 180 mg tonight then will give 90 mg per NG BID. ASA/statin/beta blocker as tolerated. Poor prognosis. Discussed case at length with his son.   05/12/12 - Echo Study Conclusions: - Left ventricle: The cavity size was normal. Terrah Decoster thickness was normal. Systolic function was severely reduced. The estimated ejection fraction was in the range of 20% to 25%. There is akinesis of the anteroseptal, inferior, and apical myocardium.  - Right ventricle: Systolic function was mildly reduced. - Pericardium, extracardiac: A trivial pericardial effusion was identified. There was a left pleural effusion   Assessment and Plan  78 yo with history of CAD s/p CABG had recent SBO with lysis of adhesions. Was readmitted with vfib arrest (cardioverted with ROSC) and anterior STEMI. He was started on hypothermia protocol and had balloon angioplasty/thrombectomy to occluded LAD (had atretic LIMA). EF 20-25% by echo.   1. Acute Anterior STEMI/CAD: S/p ballooon angioplasty/thrombectomy  to occluded LAD (no stent). LIMA known to be atretic. He is currently on ASA 81 and Brilinta. He is also going to need coumadin given PE. He had angioplasty without stenting => therefore, would envision short course of Brilinta + coumadin followed by coumadin + ASA 81 until he can come off coumadin.  Continue atorvastatin 80. Consider stopping ASA while on Brilinta and Coumadin, then resuming ASA once Brilinta stopped. 2. Hypotension: Improving, BP still soft, but remains off vasopressors.  3. Acute systolic CHF: EF 91-47%. Oral Lasix 40mg  BID added yesterday. I/Os (-) 3L since 12/8, net (+) 5L since admission. Weight not recorded since 12/8 (will order for today).  No overt volume on exam. Crt ok. MD advise on decreasing Lasix dose. Add low dose coreg with close monitoring of BP and HR. Eventually will need ACEI/ARB once BP allows.  4. Acute respiratory failure: Extubated 12/8. CXR 12/8 without pulmonary edema. Small PE (post-op), congenital pectus.   5. PE: Small, post-operative from lysis of adhesions operation. On heparin gtt and coumadin (started 12/9). INR 1.25 today 6. Vfib arrest: In setting of anterior MI treated with angioplasty. Life vest at discharge with repeat echo in a month for ? ICD. Coreg when BP more stable and volume controlled.  7. LLE pain: Bilat LE doppler 12/8 w/o DVT/SVT. foot warm and DP dopplerable; no evidence of ischemia; no chords palpated; continue heparin.  8. Hypokalemia: I will supplement this morning. Consider adding scheduled dose while on Lasix 9. Normocytic Anemia: Hgb 9.5, appears stable over the last couple of days. No signs of active bleeding, but on ASA, Brilinta, Coumadin, and heparin. Will not guaiac stools at this point as his hgb is stable and it will likely be positive as he had some oral bleeding a couple days ago and swallowed blood. If Hgb begins to trend down would guaiac stool and stop ASA. 10. Deconditioning: PT to evaluate and mobilize  today.  Signed, HOPE, JESSICA [name]  Patient examined and agree except changes made. He is euvolemic to getting dry. Will decrease Lasix to 40mg  q am. Begin low dose Carvedilol at 3.125mg  po bid. Add ACEI if BP tolerates with increased activity.  Valera Castle, MD 05/17/2012 9:49 AM

## 2012-05-18 DIAGNOSIS — I2699 Other pulmonary embolism without acute cor pulmonale: Secondary | ICD-10-CM

## 2012-05-18 DIAGNOSIS — I213 ST elevation (STEMI) myocardial infarction of unspecified site: Secondary | ICD-10-CM | POA: Diagnosis present

## 2012-05-18 DIAGNOSIS — I219 Acute myocardial infarction, unspecified: Secondary | ICD-10-CM

## 2012-05-18 DIAGNOSIS — I5021 Acute systolic (congestive) heart failure: Secondary | ICD-10-CM

## 2012-05-18 DIAGNOSIS — I509 Heart failure, unspecified: Secondary | ICD-10-CM

## 2012-05-18 LAB — BASIC METABOLIC PANEL
CO2: 28 mEq/L (ref 19–32)
Calcium: 8.4 mg/dL (ref 8.4–10.5)
Creatinine, Ser: 1.05 mg/dL (ref 0.50–1.35)
Glucose, Bld: 106 mg/dL — ABNORMAL HIGH (ref 70–99)

## 2012-05-18 LAB — CBC
HCT: 27.1 % — ABNORMAL LOW (ref 39.0–52.0)
MCH: 31 pg (ref 26.0–34.0)
MCV: 93.4 fL (ref 78.0–100.0)
Platelets: 211 10*3/uL (ref 150–400)
RBC: 2.9 MIL/uL — ABNORMAL LOW (ref 4.22–5.81)
RDW: 14.1 % (ref 11.5–15.5)

## 2012-05-18 MED ORDER — FUROSEMIDE 20 MG PO TABS
20.0000 mg | ORAL_TABLET | Freq: Every day | ORAL | Status: DC
  Administered 2012-05-19 – 2012-05-20 (×2): 20 mg via ORAL
  Filled 2012-05-18 (×2): qty 1

## 2012-05-18 MED ORDER — ZOLPIDEM TARTRATE 5 MG PO TABS: 5.0000 mg | ORAL_TABLET | Freq: Every evening | ORAL | Status: DC | PRN

## 2012-05-18 MED ORDER — WARFARIN SODIUM 2 MG PO TABS
2.0000 mg | ORAL_TABLET | Freq: Once | ORAL | Status: AC
  Administered 2012-05-18: 2 mg via ORAL
  Filled 2012-05-18: qty 1

## 2012-05-18 NOTE — Progress Notes (Signed)
ANTICOAGULATION CONSULT NOTE - Follow Up Consult  Pharmacy Consult for Heparin and Coumadin Indication: pulmonary embolus  Allergies  Allergen Reactions  . Cold Medicine Plus (Chlorphen-Pseudoephed-Apap)     COLD MEDICATIONS MAKE HIM "LOOPY"    Patient Measurements: Height: 5\' 9"  (175.3 cm) Weight: 113 lb 12.1 oz (51.6 kg) IBW/kg (Calculated) : 70.7  Heparin Dosing Weight: 51.6 kg  Vital Signs: Temp: 98.9 F (37.2 C) (12/11 0649) Temp src: Oral (12/11 0649) BP: 104/65 mmHg (12/11 0649) Pulse Rate: 77  (12/11 0649)  Labs:  Basename 05/18/12 0410 05/17/12 0520 05/16/12 0747 05/16/12 0420 05/16/12 0147 05/15/12 1947  HGB 9.0* 9.5* -- -- -- --  HCT 27.1* 28.2* -- 27.8* -- --  PLT 211 219 -- 219 -- --  APTT -- -- -- -- -- --  LABPROT 22.0* 15.5* -- -- -- --  INR 2.01* 1.25 -- -- -- --  HEPARINUNFRC 0.34 0.47 -- 0.36 -- --  CREATININE 1.05 1.09 -- 1.05 -- --  CKTOTAL -- -- -- -- -- --  CKMB -- -- -- -- -- --  TROPONINI -- -- 14.16* -- 15.02* 13.28*    Estimated Creatinine Clearance: 43 ml/min (by C-G formula based on Cr of 1.05).  Assessment:  Day # 3 overlap Heparin and Coumadin.  Heparin level is low therapeutic on 1000 units/hr.  Rapid rise in INR after Coumadin 6 mg daily x 2 doses.  H/H trending down today, but has been low. Platelet count stable. No bleeding noted. Also on Aspirin 81 mg and Brilinta 90 mg BID. Noted short course of Brilinta planned; may stop Aspirin while on Coumadin, if Hgb trends down.  Goal of Therapy:  INR 2-3 Heparin level 0.3-0.7 units/ml Monitor platelets by anticoagulation protocol: Yes   Plan:   Continue heparin drip at 1000 units/hr.  Decrease Coumadin to 2 mg today.  Continue daily heparin level, PT/INR and CBC.  Will follow up Hgb trend and +/- continue Aspirin.  Dennie Fetters, RPh Pager: [phone] 05/18/2012,10:43 AM

## 2012-05-18 NOTE — Progress Notes (Signed)
Patient Name: Louis Reid Date of Encounter: 05/18/2012   Principal Problem:  *Ventricular fibrillation arrest Active Problems:  HYPERLIPIDEMIA  HYPERTENSION  Coronary artery disease  History of coronary artery bypass surgery  Acute respiratory failure with hypoxia 2/2 cardiac arrest  Acute encephalopathy 2/2 cardiac arrest  Hyperglycemia  Cardiogenic shock  Acute systolic congestive heart failure, NYHA class 3/EF 20-25%  Anemia of critical illness  PE (pulmonary embolism)  Physical deconditioning  Anorexia   SUBJECTIVE Complains of insomnia and poor appetite. No pain anywhere. No SOB. Short term memory loss post arrest.  CURRENT MEDS    . antiseptic oral rinse  1 application Mouth Rinse QID  . aspirin  81 mg Oral Daily  . atorvastatin  80 mg Per Tube q1800  . carvedilol  3.125 mg Oral BID WC  . chlorhexidine  15 mL Mouth/Throat BID  . feeding supplement  237 mL Oral BID BM  . furosemide  40 mg Oral Daily  . pantoprazole  40 mg Oral Q1200  . [EXPIRED] potassium chloride  40 mEq Oral Q4H  . [COMPLETED] potassium chloride  40 mEq Oral Once  . Ticagrelor  90 mg Oral BID  . warfarin  2 mg Oral ONCE-1800  . [COMPLETED] warfarin  6 mg Oral ONCE-1800  . warfarin   Does not apply Once  . Warfarin - Pharmacist Dosing Inpatient   Does not apply q1800  . [DISCONTINUED] multivitamin  5 mL Per Tube Daily   OBJECTIVE  Filed Vitals:   05/17/12 1956 05/18/12 0649 05/18/12 1046 05/18/12 1347  BP: 98/54 104/65  100/58  Pulse: 86 77  57  Temp: 99.2 F (37.3 C) 98.9 F (37.2 C)  98.3 F (36.8 C)  TempSrc: Oral Oral  Oral  Resp: 18 18  18   Height:      Weight:  113 lb 12.1 oz (51.6 kg)    SpO2: 97% 97% 96% 100%    Intake/Output Summary (Last 24 hours) at 05/18/12 1418 Last data filed at 05/18/12 1230  Gross per 24 hour  Intake    350 ml  Output    951 ml  Net   -601 ml   Filed Weights   05/15/12 0441 05/17/12 0725 05/18/12 0649  Weight: 145 lb 4.5 oz (65.9 kg)  115 lb 11.9 oz (52.5 kg) 113 lb 12.1 oz (51.6 kg)   PHYSICAL EXAM  General: Pleasant, NAD. Neuro: Alert and oriented X 3. Moves all extremities spontaneously. HEENT:  Normal  Neck: Supple No JVD.  Lungs:  Diminished BS bases Heart: RRR no s3, s4, or murmurs. Abdomen: Soft, previous abdominal surgery Extremities: trace ankle edema; 2 + femoral pulses; dopplerable DP LLE; no chords; warm   Accessory Clinical Findings  CBC  Basename 05/18/12 0410 05/17/12 0520 05/16/12 0420 05/15/12 1947  WBC 7.3 6.2 -- --  NEUTROABS -- -- 7.6 9.9*  HGB 9.0* 9.5* -- --  HCT 27.1* 28.2* -- --  MCV 93.4 93.7 -- --  PLT 211 219 -- --   Basic Metabolic Panel  Basename 05/18/12 0410 05/17/12 0520 05/16/12 0420 05/15/12 1947  NA 143 141 -- --  K 3.7 3.3* -- --  CL 107 105 -- --  CO2 28 28 -- --  GLUCOSE 106* 99 -- --  BUN 20 22 -- --  CREATININE 1.05 1.09 -- --  CALCIUM 8.4 8.6 -- --  MG -- -- 2.0 1.9  PHOS -- -- 1.5* --   Liver Function Tests No results found for  this basename: AST:2,ALT:2,ALKPHOS:2,BILITOT:2,PROT:2,ALBUMIN:2 in the last 72 hours Cardiac Enzymes  Basename 05/16/12 0747 05/16/12 0147 05/15/12 1947  CKTOTAL -- -- --  CKMB -- -- --  CKMBINDEX -- -- --  TROPONINI 14.16* 15.02* 13.28*     Radiology/Studies  Dg Chest Port 1 View  05/15/2012  *RADIOLOGY REPORT*  Clinical Data: Assessed pulmonary edema.  Intubated patient.  PORTABLE CHEST - 1 VIEW  Comparison: 05/14/2012  Findings: Bilateral pleural effusions with associated atelectasis is stable.  The lungs are hyperexpanded.  There is no pneumothorax.  Changes from CABG surgery are stable.  The endotracheal tube, nasogastric tube and left subclavian central venous line remain well positioned and unchanged.  IMPRESSION: No convincing active pulmonary edema.  Bilateral pleural effusions with lung base opacity, most likely atelectasis.   Original Report Authenticated By: Amie Portland, M.D.    ASSESSMENT AND PLAN 78 yo with  history of CAD s/p CABG had recent SBO with lysis of adhesions. Was readmitted with vfib arrest (cardioverted with ROSC) and anterior STEMI. He was started on hypothermia protocol and had balloon angioplasty/thrombectomy to occluded LAD (had atretic LIMA). EF 20-25% by echo.  1. CAD: S/p ballooon angioplasty/thrombectomy to occluded LAD (no stent). LIMA known to be atretic. He is currently on ASA 81 and Brilinta which we will continue for now. Continue atorvastatin 80. He is also going to need coumadin given PE. He had angioplasty without stenting => therefore, would envision short course of ASA 81 mg + Brilinta + coumadin approximately 1 month followed by coumadin + ASA 81 until he can come off coumadin.  2. Hypotension: Improved. BP still soft. This limits titration of coreg or addition of ACEi. 3. Acute systolic CHF: EF 16-10%. Volume improved significantly. I/O negative 1500 cc. Weight down. Will reduce lasix to 20 mg daily. 4. Acute respiratory failure/ventilated: Pulmonary edema, small PE (post-op), congenital pectus.  - Continue to diurese.  - heparin gtt for PE need to overlap 24-48 hrs. INR 2.01 today. 5. PE: Small, post-operative from lysis of adhesions operation. On heparin gtt. Adjusting  coumadin. 6. Vfib arrest: In setting of anterior MI treated with angioplasty. Life vest at discharge/ transfer to CIR with repeat echo in a month for ? ICD. On low dose Coreg. Will contact life vest to fit patient. 7. Anoxic encephalopathy post arrest with memory loss, balance deficits, and impaired mobility. Plan for CIR.  Signed, Peter [place] MD, [facility]  05/18/2012 2:28 PM

## 2012-05-18 NOTE — Progress Notes (Addendum)
[facility] Progress Note    Louis Reid:[id number] DOB: [birth year] DOA: 05/10/2012 PCP: [name], MD  Brief narrative: 78 year old male patient admitted on 06/06/2012 with ventricular fibrillation cardiac arrest secondary cardiac ischemia. He required mechanical ventilation and was started on the hypothermia protocol. CT of the chest was completed on 05/12/2012 and revealed a small pulmonary embolism. Lower extremity Dopplers revealed no evidence of DVT. Patient had recently been hospitalized for 11 days and was discharged 05/09/2012 after small bowel obstruction that required support her laparotomy with lysis of adhesions. Patient eventually underwent cardiac catheterization with intervention to an occluded LAD. He stabilized enough to be weaned off of pressors and was extubated and deemed appropriate to transfer to telemetry on 05/16/2012. Team 1 assumed care of this patient on 05/17/2012  SIGNIFICANT EVENTS:  12/3 Bought to [facility] ED after suffering VF cardiac arrest and ROSC after brief CPR and shocks x 3. Hypothermia protocol started.  12/3- cath- PTCA (balloon angioplasty) only proximal/mid LAD Thrombectomy occluded LAD  12/4- hypothermia continuous, oozing mouth, urine  12/5-acidosis, bleeding resolved, shock worse  12/5 CT chest - lingula PE, small efusions, Ct head - neg acute  12/5 CT abndo/eplvis - Distended gallbladder, containing sludge versus vicariously<BR>excreted contrast.<BR>3. Resolution of small bowel dilatation since prior exam. No<BR>evidence of abscess  12/6- follows commands on rewarm  12/7- low dose pressor needs  12/8- neg 2.1 liters   Assessment/Plan:  Ventricular fibrillation arrest due to: Anterior MI/Coronary artery disease s/p PTCA LAD--History of coronary artery bypass surgery  Management by cardiology  Status post balloon angioplasty/thrombectomy to occluded LAD (no stent). Continue aspirin 81 mg, Brilinta, and atorvastatin 80 mg. Per  cardiology, in vision short course of aspirin 81 mg + Brilinta + Coumadin approximately one month followed by Coumadin + aspirin 81 mg until he can come off Coumadin.  Life Vest at discharge.  Repeat echo in a month and? ICD.    Acute respiratory failure with hypoxia 2/2 cardiac arrest, pulmonary edema and PE.  Resolved.   Cardiogenic shock  Resolved   HYPERTENSION  Soft blood pressures. Limiting use of ACE inhibitors or titration of Coreg.   Acute encephalopathy 2/2 cardiac arrest  Improving. Per family at bedside, patient has memory deficits.  Discharge to inpatient rehabilitation versus SNF when stable.   Acute systolic congestive heart failure, NYHA class 3/EF 20-25% *Compensated-management per cardiology  Acute pulmonary embolism-small  Lower extremity Dopplers are negative and patient had a tiny focal pulmonary embolism  Pulmonology recommends anticoagulation with Coumadin for 6 months.  Pharmacy managing heparin and Coumadin   Physical deconditioning  PT OT evaluations in process  See if patient qualifies for inpatient rehabilitation services   HYPERLIPIDEMIA  Continue statin   Hyperglycemia  Check Hemoglobin A1c   Anemia of critical illness  Follow hemoglobin- would only transfuse if hemoglobin less than or equal to 7.5. Stable.   Anorexia  Nutrition evaluation   DVT prophylaxis: On full dose IV heparin for PE Code Status: Partial code with no CPR and no defibrillation Family Communication: Spoke with patient, son and daughter in law at bedside. Disposition Plan: Discharge to CIR versus SNF when stable.  Consultants: Cardiology Pulmonary  Procedures: 12/3 L [place] CVL >>> 12/3 cardiac catheterization with balloon angioplasty to proximal and mid LAD  CULTURES:  12/5 BC>>>  12/5 sputum>>>negative  Antibiotics: vanc 12/5>>>12/9  Zosyn 12/5>>>12/9  myco 12/5>>>12/6   HPI/Subjective: Per family, forgetful. Appetite slowly improving.  Request something to sleep at night.   Objective:  Blood pressure 100/58, pulse 57, temperature 98.3 F (36.8 C), temperature source Oral, resp. rate 18, height 5\' 9"  (1.753 m), weight 51.6 kg (113 lb 12.1 oz), SpO2 100.00%.  Intake/Output Summary (Last 24 hours) at 05/18/12 1847 Last data filed at 05/18/12 1230  Gross per 24 hour  Intake    350 ml  Output    951 ml  Net   -601 ml     Exam: General: No acute respiratory distress, cachectic and frail appearing man. Was seen sitting up looking breakfast. Lungs: Clear to auscultation bilaterally without wheezes or crackles Cardiovascular: Regular rate and rhythm without murmur gallop or rub normal S1 and S2, no JVD and no peripheral edema. Telemetry shows sinus rhythm. Abdomen: Nontender, nondistended, soft, bowel sounds positive, no rebound, no ascites, no appreciable mass Musculoskeletal: No significant cyanosis, clubbing of bilateral lower extremities Neurological: Patient is alert and oriented to name and place only. No focal neurological deficits.  Data Reviewed: Basic Metabolic Panel:  Lab 05/18/12 1610 05/17/12 0520 05/16/12 0420 05/15/12 1947 05/15/12 1600 05/15/12 1140 05/13/12 0506 05/12/12 1345 05/12/12 0357  NA 143 141 142 -- 144 145 -- -- --  K 3.7 3.3* 4.0 -- 4.1 3.2* -- -- --  CL 107 105 108 -- 110 107 -- -- --  CO2 28 28 28  -- 27 25 -- -- --  GLUCOSE 106* 99 101* -- 109* 109* -- -- --  BUN 20 22 25* -- 26* 25* -- -- --  CREATININE 1.05 1.09 1.05 -- 0.99 0.85 -- -- --  CALCIUM 8.4 8.6 8.0* -- 7.3* 6.8* -- -- --  MG -- -- 2.0 1.9 -- -- 2.1 2.3 1.4*  PHOS -- -- 1.5* -- -- -- -- -- --   Liver Function Tests:  Lab 05/14/12 0500 05/13/12 0506 05/12/12 0411  AST 60* 111* 292*  ALT 39 50 78*  ALKPHOS 70 68 69  BILITOT 0.3 0.2* 0.2*  PROT 4.8* 4.5* 5.1*  ALBUMIN 1.9* 1.9* 2.3*   No results found for this basename: LIPASE:5,AMYLASE:5 in the last 168 hours No results found for this basename: AMMONIA:5 in the last  168 hours CBC:  Lab 05/18/12 0410 05/17/12 0520 05/16/12 0420 05/15/12 1947 05/15/12 0435 05/14/12 0500 05/13/12 0506  WBC 7.3 6.2 8.6 10.9* 15.5* -- --  NEUTROABS -- -- 7.6 9.9* 14.3* 17.0* 12.1*  HGB 9.0* 9.5* 9.2* 8.8* 9.4* -- --  HCT 27.1* 28.2* 27.8* 26.4* 28.5* -- --  MCV 93.4 93.7 95.9 94.6 96.0 -- --  PLT 211 219 219 224 250 -- --   Cardiac Enzymes:  Lab 05/16/12 0747 05/16/12 0147 05/15/12 1947  CKTOTAL -- -- --  CKMB -- -- --  CKMBINDEX -- -- --  TROPONINI 14.16* 15.02* 13.28*   BNP (last 3 results) No results found for this basename: PROBNP:3 in the last 8760 hours CBG:  Lab 05/16/12 0804 05/16/12 0414 05/16/12 05/15/12 2051 05/15/12 1614  GLUCAP 106* 78 111* 104* 108*    Recent Results (from the past 240 hour(s))  CULTURE, RESPIRATORY     Status: Normal   Collection Time   05/11/12  9:40 AM      Component Value Range Status Comment   Specimen Description OTHER   Final    Special Requests ETT SUCTION   Final    Gram Stain     Final    Value: MODERATE WBC PRESENT, PREDOMINANTLY PMN     FEW SQUAMOUS EPITHELIAL CELLS PRESENT     MODERATE GRAM NEGATIVE  COCCI     FEW GRAM NEGATIVE RODS   Culture Non-Pathogenic Oropharyngeal-type Flora Isolated.   Final    Report Status 05/13/2012 FINAL   Final   CULTURE, BLOOD (ROUTINE X 2)     Status: Normal   Collection Time   05/11/12 11:29 AM      Component Value Range Status Comment   Specimen Description BLOOD LEFT ARM   Final    Special Requests BOTTLES DRAWN AEROBIC AND ANAEROBIC 10CC   Final    Culture  Setup Time 05/11/2012 21:55   Final    Culture     Final    Value:        BLOOD CULTURE RECEIVED NO GROWTH TO DATE CULTURE WILL BE HELD FOR 5 DAYS BEFORE ISSUING A FINAL NEGATIVE REPORT   Report Status 05/17/2012 FINAL   Final   CULTURE, BLOOD (ROUTINE X 2)     Status: Normal   Collection Time   05/11/12 11:39 AM      Component Value Range Status Comment   Specimen Description BLOOD LEFT FOREARM   Final    Special  Requests BOTTLES DRAWN AEROBIC ONLY 10CC   Final    Culture  Setup Time 05/11/2012 21:55   Final    Culture     Final    Value:        BLOOD CULTURE RECEIVED NO GROWTH TO DATE CULTURE WILL BE HELD FOR 5 DAYS BEFORE ISSUING A FINAL NEGATIVE REPORT   Report Status 05/17/2012 FINAL   Final      Studies:  Recent x-ray studies have been reviewed in detail by the Attending Physician  Scheduled Meds:  Reviewed in detail by the Attending Physician   [facility] 6:47 PM Pager: [phone]  If 7PM-7AM, please contact night-coverage [email or web address] Password [email or web address] 05/18/2012, 6:47 PM   LOS: 8 days

## 2012-05-18 NOTE — Evaluation (Signed)
Occupational Therapy Evaluation Patient Details Name: Louis Reid MRN: [id number] DOB: 07-29-1932 Today's Date: 05/18/2012 Time: 7829-5621 OT Time Calculation (min): 37 min  OT Assessment / Plan / Recommendation Clinical Impression  Pt admitted on 05/10/12 secondary to VF cardiac arrest and ROSC after breif CPR and shocks x3. Skilled OT indicated to address the below stated deficits in prep for d/c to next venue of care.    OT Assessment  Patient needs continued OT Services    Follow Up Recommendations  CIR    Barriers to Discharge Decreased caregiver support    Equipment Recommendations  3 in 1 bedside comode    Recommendations for Other Services    Frequency  Min 2X/week    Precautions / Restrictions Precautions Precautions: Fall Precaution Comments: postural sway, mild posterior lean Restrictions Weight Bearing Restrictions: No   Pertinent Vitals/Pain Denied pain    ADL  Grooming: Performed;Min guard;Set up;Brushing hair Where Assessed - Grooming: Supported standing Upper Body Bathing: Simulated;Minimal assistance Where Assessed - Upper Body Bathing: Unsupported sitting Lower Body Bathing: Simulated;Maximal assistance Where Assessed - Lower Body Bathing: Supported sit to stand Upper Body Dressing: Simulated;Minimal assistance Where Assessed - Upper Body Dressing: Unsupported sitting Lower Body Dressing: Simulated;Maximal assistance Where Assessed - Lower Body Dressing: Supported sit to stand Toilet Transfer: Performed;Minimal assistance Toilet Transfer Method: Sit to Barista: Raised toilet seat with arms (or 3-in-1 over toilet) Toileting - Clothing Manipulation and Hygiene: Performed;Moderate assistance (for thoroughness with back peri hygeine.) Where Assessed - Toileting Clothing Manipulation and Hygiene: Sit to stand from 3-in-1 or toilet Equipment Used: Rolling walker;Gait belt Transfers/Ambulation Related to ADLs: Pt ambulated to the  bathroom with min A and constant VCs for posture, safety and to stay within confines of RW. ADL Comments: Pt fatigues quickly. Tolerated unsupported standing at sink for 1-2 min to wash hands after using bathroom but had to sit and comb hair.    OT Diagnosis: Generalized weakness  OT Problem List: Decreased activity tolerance;Decreased safety awareness;Decreased cognition;Decreased knowledge of use of DME or AE;Impaired balance (sitting and/or standing) OT Treatment Interventions: Self-care/ADL training;Therapeutic activities;Energy conservation;DME and/or AE instruction;Patient/family education   OT Goals Acute Rehab OT Goals OT Goal Formulation: With patient Time For Goal Achievement: 06/01/12 Potential to Achieve Goals: Good ADL Goals Pt Will Perform Grooming: with supervision;Standing at sink (x 3 tasks to improve standing activity tolerance.) ADL Goal: Grooming - Progress: Goal set today Pt Will Perform Upper Body Bathing: with set-up;Sitting, edge of bed;Sitting, chair;Unsupported ADL Goal: Upper Body Bathing - Progress: Goal set today Pt Will Perform Lower Body Bathing: with min assist;Sit to stand from chair;Sit to stand from bed ADL Goal: Lower Body Bathing - Progress: Goal set today Pt Will Perform Upper Body Dressing: with set-up;Sitting, bed;Sitting, chair;Unsupported ADL Goal: Upper Body Dressing - Progress: Goal set today Pt Will Perform Lower Body Dressing: with min assist;Sit to stand from bed;Sit to stand from chair ADL Goal: Lower Body Dressing - Progress: Goal set today Pt Will Transfer to Toilet: with supervision;Ambulation;with DME ADL Goal: Toilet Transfer - Progress: Goal set today Pt Will Perform Toileting - Clothing Manipulation: with supervision;Sitting on 3-in-1 or toilet;Standing ADL Goal: Toileting - Clothing Manipulation - Progress: Goal set today Pt Will Perform Toileting - Hygiene: with supervision;Sit to stand from 3-in-1/toilet ADL Goal: Toileting -  Hygiene - Progress: Goal set today  Visit Information  Last OT Received On: 05/18/12 Assistance Needed: +1    Subjective Data  Subjective: Now what do you  want me to do? Patient Stated Goal: Not asked   Prior Functioning     Home Living Lives With: Alone Available Help at Discharge: Family;Available PRN/intermittently Type of Home: House Home Access: Stairs to enter [employer] of Steps: 3-4 Entrance Stairs-Rails: None Home Layout: One level Bathroom Shower/Tub: Engineer, manufacturing systems: Standard Home Adaptive Equipment: None Prior Function Level of Independence: Independent Able to Take Stairs?: Yes Driving: Yes Vocation: Retired Musician: No difficulties Dominant Hand: Right         Vision/Perception     Cognition  Overall Cognitive Status: Impaired Area of Impairment: Attention;Memory;Following commands;Awareness of deficits;Awareness of errors;Problem solving Arousal/Alertness: Awake/alert Orientation Level: Oriented X4 / Intact Behavior During Session: [facility] for tasks performed Current Attention Level: Focused Attention - Other Comments: Continues to req constant step-by-step cueing for task, easily and quickly forgets what the plan was Memory:  (decr recall of DME instructions from prior day) Following Commands: Follows one step commands consistently;Follows multi-step commands inconsistently Awareness of Errors: Assistance required to identify errors made;Assistance required to correct errors made Problem Solving: req cues to achieve self care (donning socks)    Extremity/Trunk Assessment Right Upper Extremity Assessment RUE ROM/Strength/Tone: [facility] for tasks assessed Left Upper Extremity Assessment LUE ROM/Strength/Tone: [facility] for tasks assessed     Mobility Bed Mobility Bed Mobility: Supine to Sit;Sitting - Scoot to Edge of Bed Rolling Left: 6: Modified independent (Device/Increase time);With rail Left Sidelying to  Sit: 5: Supervision;HOB elevated Sitting - Scoot to Edge of Bed: 6: Modified independent (Device/Increase time) Transfers Sit to Stand: 3: Mod assist;With upper extremity assist;From bed Stand to Sit: 3: Mod assist;With upper extremity assist;To chair/3-in-1;To toilet;To bed Details for Transfer Assistance: max and constant cues for safe transfer, controlled movment, and balance; improved balance but continues to have postural sway      Shoulder Instructions     Exercise     Balance Balance Balance Assessed: Yes Static Sitting Balance Static Sitting - Balance Support: No upper extremity supported;Feet supported Static Sitting - Level of Assistance: 5: Stand by assistance Static Sitting - Comment/# of Minutes: 3-5 min donning/doffing socks, posterior and right lean; pt states he feels centered Static Standing Balance Static Standing - Balance Support: Bilateral upper extremity supported Static Standing - Level of Assistance: 4: Min assist Static Standing - Comment/# of Minutes: prior to ambulations- mild postural sway  Dynamic Standing Balance Dynamic Standing - Balance Support: No upper extremity supported;During functional activity Dynamic Standing - Level of Assistance: 4: Min assist Dynamic Standing - Balance Activities: Other (comment) (Self -care; cleaning self after toileting and washing hands) Dynamic Standing - Comments: 3-20min for self care, req cues and assist for balance and sequencing   End of Session OT - End of Session Equipment Utilized During Treatment: Gait belt Activity Tolerance: Patient limited by fatigue Patient left: in chair;with call bell/phone within reach  GO     Kewanna Kasprzak A OTR/L [phone] 05/18/2012, 3:44 PM

## 2012-05-18 NOTE — Clinical Social Work Psychosocial (Signed)
Late entry 05/18/12 for 05/17/12  Clinical Social Work Department BRIEF PSYCHOSOCIAL ASSESSMENT 05/18/2012  Patient:  CHANCELLOR, VANDERLOOP     Account Number:  [id number]     Admit date:  05/10/2012  Clinical Social Worker:  Thomasene Mohair  Date/Time:  05/17/2012 04:30 PM  Referred by:  Care Management  Date Referred:  05/17/2012 Referred for  SNF Placement   Other Referral:   Interview type:  Patient Other interview type:   family    PSYCHOSOCIAL DATA Living Status:  ALONE Admitted from facility:   Level of care:   Primary support name:  [name]  [phone] Primary support relationship to patient:  CHILD, ADULT Degree of support available:   stong    CURRENT CONCERNS Current Concerns  Post-Acute Placement   Other Concerns:    SOCIAL WORK ASSESSMENT / PLAN CSW was referred to Pt to assist with dc planning. Pt lives alone, but has strong support from his family. Pt recently had bowel surgery and son was taking care of him post-op. Pt is being considered for CIR pending insurance approval at this time. Pt and his family would like snf options in case insurance denies CIR.  CSW discussed placement process and will begin bed search for Pt.   Assessment/plan status:  Psychosocial Support/Ongoing Assessment of Needs Other assessment/ plan:   Information/referral to community resources:    PATIENT'S/FAMILY'S RESPONSE TO PLAN OF CARE: Pt very grateful for the care he has received from critical care and cardiology. Pt stated that he his lucky to have supportive family as well and is ready to "get better." Pt and family agreeable to SNF if CIR is not approved.   Frederico Hamman, LCSW [phone]

## 2012-05-18 NOTE — Progress Notes (Signed)
I met with patient at bedside. Patient will benefit from an inpt rehab admission prior to d/c home pending [employer] approval. I await OT and SLP evals today to begin insurance authorization. Patient in agreement. Please call me with any questions. [phone]

## 2012-05-18 NOTE — Progress Notes (Signed)
PULMONARY  / CRITICAL CARE MEDICINE  Name: Louis Reid MRN: [id number] DOB: Sep 06, 1932    LOS: 8  REFERRING MD:  PMD  CHIEF COMPLAINT:  Cardiac arrest  BRIEF PATIENT DESCRIPTION:   78 yo male admitted on 05/10/2012 with VF arrest and VDRF, s/p hypothermia protocol.  Noted to have small PE on CT chest 12/05.  Had recent admit for SBO with lysis of adhesions.  Significant PMHx CAD s/p CABG, HTN, Hyperlipidemia, Depression, Pectus excavatum  LINES / TUBES: 12/3  OETT >>>1208 12/3  L [place] CVL >>>  CULTURES: 12/5 BC>>> 12/5 sputum>>>negative  ANTIBIOTICS: vanc 12/5>>>12/9 Zosyn 12/5>>>12/9 myco 12/5>>>12/6  SIGNIFICANT EVENTS:  12/3  Bought to [facility] ED after suffering VF cardiac arrest and ROSC after brief CPR and shocks x 3.  Hypothermia protocol started.   12/3- cath- PTCA (balloon angioplasty) only proximal/mid LAD Thrombectomy occluded LAD 12/4- hypothermia continuous, oozing mouth, urine 12/5-acidosis, bleeding resolved, shock worse 12/5 CT chest - lingula PE, small efusions, Ct head - neg acute 12/5 CT abndo/eplvis - Distended gallbladder, containing sludge versus vicariously<BR>excreted contrast.<BR>3. Resolution of small bowel dilatation since prior exam. No<BR>evidence of abscess 12/6- follows commands on rewarm 12/7- low dose pressor needs 12/8- neg 2.1 liters   INTERVAL HISTORY: , neg 1.5 liters Improving daily, in a chair  VITAL SIGNS: Temp:  [97.3 F (36.3 C)-99.2 F (37.3 C)] 98.9 F (37.2 C) (12/11 0649) Pulse Rate:  [77-96] 77  (12/11 0649) Resp:  [18] 18  (12/11 0649) BP: (94-104)/(54-71) 104/65 mmHg (12/11 0649) SpO2:  [95 %-97 %] 96 % (12/11 1046) Weight:  [51.6 kg (113 lb 12.1 oz)] 51.6 kg (113 lb 12.1 oz) (12/11 0649)  HEMODYNAMICS:    VENTILATOR SETTINGS:   INTAKE / OUTPUT: Intake/Output      12/10 0701 - 12/11 0700 12/11 0701 - 12/12 0700   P.O. 250 150   I.V. (mL/kg)     IV Piggyback     Total Intake(mL/kg) 250 (4.8) 150 (2.9)   Urine  (mL/kg/hr) 1800 (1.5)    Stool 2    Total Output 1802    Net -1552 +150         PHYSICAL EXAMINATION: General:  No distress Neuro:  Follows commands in a chair HEENT: No sinus tenderness Cardiovascular:  s1s2 regular Lungs:  Reduced and pectus excavatum Abdomen:  Soft, nontender, surgical incision C/D/I,increased BS Musculoskeletal:  Moves all extremities, no edema Skin:  Intact  LABS:  Lab 05/18/12 0410 05/17/12 0520 05/16/12 0747 05/16/12 0420 05/16/12 0147 05/15/12 1947 05/14/12 0505 05/14/12 0500 05/13/12 0506 05/13/12 0500 05/12/12 1200 05/12/12 1000 05/12/12 0438 05/12/12 0411  HGB 9.0* 9.5* -- 9.2* -- -- -- -- -- -- -- -- -- --  WBC 7.3 6.2 -- 8.6 -- -- -- -- -- -- -- -- -- --  PLT 211 219 -- 219 -- -- -- -- -- -- -- -- -- --  NA 143 141 -- 142 -- -- -- -- -- -- -- -- -- --  K 3.7 3.3* -- -- -- -- -- -- -- -- -- -- -- --  CL 107 105 -- 108 -- -- -- -- -- -- -- -- -- --  CO2 28 28 -- 28 -- -- -- -- -- -- -- -- -- --  GLUCOSE 106* 99 -- 101* -- -- -- -- -- -- -- -- -- --  BUN 20 22 -- 25* -- -- -- -- -- -- -- -- -- --  CREATININE 1.05 1.09 -- 1.05 -- -- -- -- -- -- -- -- -- --  CALCIUM 8.4 8.6 -- 8.0* -- -- -- -- -- -- -- -- -- --  MG -- -- -- 2.0 -- 1.9 -- -- 2.1 -- -- -- -- --  PHOS -- -- -- 1.5* -- -- -- -- -- -- -- -- -- --  AST -- -- -- -- -- -- -- 60* 111* -- -- -- -- 292*  ALT -- -- -- -- -- -- -- 39 50 -- -- -- -- 11*  ALKPHOS -- -- -- -- -- -- -- 70 68 -- -- -- -- 69  BILITOT -- -- -- -- -- -- -- 0.3 0.2* -- -- -- -- 0.2*  PROT -- -- -- -- -- -- -- 4.8* 4.5* -- -- -- -- 5.1*  ALBUMIN -- -- -- -- -- -- -- 1.9* 1.9* -- -- -- -- 2.3*  APTT -- -- -- -- -- -- -- -- -- -- -- -- -- --  INR 2.01* 1.25 -- -- -- -- -- -- -- -- -- -- -- --  LATICACIDVEN -- -- -- -- -- -- -- -- -- -- -- 2.0 1.7 --  TROPONINI -- -- 14.16* -- 15.02* 13.28* -- -- -- -- -- -- -- --  PROCALCITON -- -- -- -- -- -- -- -- -- -- -- -- -- --  PROBNP -- -- -- -- -- -- -- -- -- -- -- -- -- --   O2SATVEN -- -- -- -- -- -- -- -- -- -- -- -- -- --  PHART -- -- -- -- -- -- 7.410 -- -- 7.419 7.206* -- -- --  PCO2ART -- -- -- -- -- -- 29.6* -- -- 26.4* 35.2 -- -- --  PO2ART -- -- -- -- -- -- 114.0* -- -- 129.0* 116.0* -- -- --    Lab 05/16/12 0804 05/16/12 0414 05/16/12 05/15/12 2051 05/15/12 1614  GLUCAP 106* 78 111* 104* 108*   IMAGING:  pcxr -pending  DIAGNOSES: Principal Problem:  *Ventricular fibrillation arrest Active Problems:  HYPERLIPIDEMIA  HYPERTENSION  Coronary artery disease  History of coronary artery bypass surgery  Acute respiratory failure with hypoxia 2/2 cardiac arrest  Acute encephalopathy 2/2 cardiac arrest  Hyperglycemia  Cardiogenic shock  Acute systolic congestive heart failure, NYHA class 3/EF 20-25%  Anemia of critical illness  PE (pulmonary embolism)  Physical deconditioning  Anorexia  ASSESSMENT / PLAN:  PULMONARY  A:   Acute respiratory failure 2nd to cardiac arrest. Resolved. Acute PE-Likely related to recent surgery. P:   Continue heparin/coumadin per pharmacy This pe is present on CT and contributed to admission, he is post op as well Would heparin to coumadin and use for 6 months, re eval then as outpt Lasix to negative balance tolerated In setting cad, would assess [place]ocy[place] [address] gene mutation Age over 50, assess ana, esr   Re singing off, se above recs   Mcarthur Rossetti. Tyson Alias, MD, FACP Pgr: [phone] [facility]

## 2012-05-18 NOTE — Progress Notes (Addendum)
I have OT, SLP evals and will begin authorization. I have spoken with pt's son by phone and he is aware of plan. I advised him to contact pt's cardiologist to discuss plans for further cardiac care involving possible Life vest and ICD. [phone]

## 2012-05-18 NOTE — Progress Notes (Signed)
Physical Therapy Treatment Patient Details Name: Louis Reid MRN: [id number] DOB: 05-20-1933 Today's Date: 05/18/2012 Time: 4098-1191 PT Time Calculation (min): 34 min  PT Assessment / Plan / Recommendation Comments on Treatment Session  Pt admitted on 05/10/12 secondary to VF cardiac arrest and ROSC after breif CPR and shocks x3.  Presents today with less posterior lean in standing, but remains with memory, mobility, balance, gait, and activity tolerance deficits. Will continue to benefit from skilled therapy to address these limitations and maximize mobility and safety.     Follow Up Recommendations  CIR     Does the patient have the potential to tolerate intense rehabilitation   Yes, recommend CIR     Equipment Recommendations  Rolling walker with 5" wheels       Frequency Min 3X/week   Plan Frequency remains appropriate;Discharge plan needs to be updated    Precautions / Restrictions Precautions Precautions: Fall Precaution Comments: postural sway, mild posterior lean Restrictions Weight Bearing Restrictions: No   Pertinent Vitals/Pain VSS, no pain.     Mobility  Bed Mobility Bed Mobility: Supine to Sit;Sitting - Scoot to Edge of Bed Rolling Left: 6: Modified independent (Device/Increase time);With rail Left Sidelying to Sit: 5: Supervision;HOB elevated Sitting - Scoot to Edge of Bed: 6: Modified independent (Device/Increase time) Transfers Transfers: Sit to Stand;Stand to Sit Sit to Stand: 3: Mod assist;With upper extremity assist;From bed Stand to Sit: 3: Mod assist;With upper extremity assist;To chair/3-in-1;To toilet;To bed Details for Transfer Assistance: max and constant cues for safe transfer, controlled movment, and balance; improved balance but continues to have postural sway  Ambulation/Gait Ambulation/Gait Assistance: 3: Mod assist Ambulation Distance (Feet): 40 Feet Assistive device: Rolling walker Ambulation/Gait Assistance Details: Cues for correct  DME with improved recall for several steps, until posture deteriorates and he forgets all cues, pt continues to ask for assurance he is moving as he is told Gait Pattern: Step-to pattern;Decreased stride length;Trunk flexed Gait velocity: decr General Gait Details: trunk flexion incr over time, req cues to positionally readjust Stairs: No Wheelchair Mobility Wheelchair Mobility: No     PT Goals Acute Rehab PT Goals PT Goal: Supine/Side to Sit - Progress: Progressing toward goal PT Goal: Sit to Stand - Progress: Progressing toward goal PT Goal: Stand to Sit - Progress: Progressing toward goal PT Transfer Goal: Bed to Chair/Chair to Bed - Progress: Progressing toward goal PT Goal: Stand - Progress: Progressing toward goal PT Goal: Ambulate - Progress: Progressing toward goal  Visit Information  Last PT Received On: 05/18/12 Assistance Needed: +1 PT/OT Co-Evaluation/Treatment: Yes    Subjective Data  Subjective: "You gotta do what you gotta do." Patient Stated Goal: To do whatever I need to do to get better   Cognition  Overall Cognitive Status: Impaired Area of Impairment: Attention;Memory;Following commands;Awareness of errors;Awareness of deficits;Problem solving Arousal/Alertness: Awake/alert Orientation Level: Oriented X4 / Intact Behavior During Session: [facility] for tasks performed Current Attention Level: Focused Attention - Other Comments: Continues to req constant step-by-step cueing for task, easily and quickly forgets what the plan was Memory:  (decr recall of DME instructions from prior day) Following Commands: Follows one step commands consistently;Follows multi-step commands inconsistently Awareness of Errors: Assistance required to identify errors made;Assistance required to correct errors made Problem Solving: req cues to achieve self care (donning socks)    Balance  Balance Balance Assessed: Yes Static Sitting Balance Static Sitting - Balance Support: No upper  extremity supported;Feet supported Static Sitting - Level of Assistance: 5: Stand by  assistance Static Sitting - Comment/# of Minutes: 3-5 min donning/doffing socks, posterior and right lean; pt states he feels centered Static Standing Balance Static Standing - Balance Support: Bilateral upper extremity supported Static Standing - Level of Assistance: 4: Min assist Static Standing - Comment/# of Minutes: prior to ambulations- mild postural sway  Dynamic Standing Balance Dynamic Standing - Balance Support: No upper extremity supported;During functional activity Dynamic Standing - Level of Assistance: 4: Min assist Dynamic Standing - Balance Activities: Other (comment) (Self -care; cleaning self after toileting and washing hands) Dynamic Standing - Comments: 3-58min for self care, req cues and assist for balance and sequencing  End of Session PT - End of Session Equipment Utilized During Treatment: Gait belt Activity Tolerance: Patient limited by fatigue Patient left: in chair;with call bell/phone within reach;with nursing in room Nurse Communication: Mobility status;Other (comment) (Condom cath in need of replacement as it fell off during transfer to chair)       Sharion Balloon 05/18/2012, 12:11 PM Sharion Balloon, SPT Acute Rehab Services [phone]

## 2012-05-18 NOTE — Progress Notes (Signed)
Speech Language Pathology Dysphagia Treatment Patient Details Name: Louis Reid MRN: [id number] DOB: Jan 12, 1933 Today's Date: 05/18/2012 Time: 1000-1010 SLP Time Calculation (min): 10 min  Assessment / Plan / Recommendation Clinical Impression  Pt. seen for dysphagia treatment for safety with thin liquids.  Immedidate cough likely due to multiple consecutive sips.  Small, single sips appeared safe.  Recommend continue Dys 3 diet texture for energy conservation.  ST will follow up and recommend diet texture upgrade when appropriate.    Diet Recommendation  Continue with Current Diet: Dysphagia 3 (mechanical soft);Thin liquid    SLP Plan Continue with current plan of care       Swallowing Goals  SLP Swallowing Goals Patient will consume recommended diet without observed clinical signs of aspiration with: Minimal assistance Swallow Study Goal #1 - Progress: Progressing toward goal Patient will utilize recommended strategies during swallow to increase swallowing safety with: Minimal assistance Swallow Study Goal #2 - Progress: Progressing toward goal  General Temperature Spikes Noted: No Respiratory Status: Room air Behavior/Cognition: Alert;Cooperative;Pleasant mood Oral Cavity - Dentition: Adequate natural dentition Patient Positioning: Upright in chair  Oral Cavity - Oral Hygiene Does patient have any of the following "at risk" factors?: None of the above Brush patient's teeth BID with toothbrush (using toothpaste with fluoride): Yes   Dysphagia Treatment Treatment focused on: Skilled observation of diet tolerance;Patient/family/caregiver education Treatment Methods/Modalities: Skilled observation Patient observed directly with PO's: Yes Type of PO's observed: Regular;Thin liquids Feeding: Able to feed self Liquids provided via: Cup;Straw Pharyngeal Phase Signs & Symptoms: Immediate cough Type of cueing: Verbal   GO     Royce Macadamia M.Ed [employer]  [phone]  05/18/2012

## 2012-05-18 NOTE — Evaluation (Signed)
SLP has reviewed and agrees with student's note below.  Raechell Singleton Willis Shavell Nored M.Ed CCC-SLP Pager [phone]  05/18/2012  

## 2012-05-18 NOTE — Progress Notes (Signed)
Louis Reid,PT Acute Rehabilitation [phone] (pager)  

## 2012-05-18 NOTE — Evaluation (Signed)
Speech Language Pathology Evaluation Patient Details Name: Louis Reid MRN: [id number] DOB: [birth year] Today's Date: 05/18/2012 Time: 1005-1015 SLP Time Calculation (min): 10 min  Problem List:  Patient Active Problem List  Diagnosis  . HYPERLIPIDEMIA  . HYPERTENSION  . Asymptomatic old MI (myocardial infarction)  . TOBACCO USE, QUIT  . DYSPNEA  . Coronary artery disease  . History of coronary artery bypass surgery  . Odynophagia  . Ventricular fibrillation arrest  . Cardiac arrest  . Acute respiratory failure with hypoxia 2/2 cardiac arrest  . Acute encephalopathy 2/2 cardiac arrest  . Hyperglycemia  . Cardiogenic shock  . Hypotension  . Acute systolic congestive heart failure, NYHA class 3/EF 20-25%  . Anemia of critical illness  . PE (pulmonary embolism)  . Physical deconditioning  . Anorexia  . STEMI (ST elevation myocardial infarction)   Past Medical History:  Past Medical History  Diagnosis Date  . Acute myocardial infarction of anterolateral wall, episode of care unspecified   . Coronary atherosclerosis of artery bypass graft   . Sinoatrial node dysfunction   . HTN (hypertension)   . HLD (hyperlipidemia)   . Depression   . History of kidney stones   . Congenital funnel chest   . Inguinal hernia   . Osteoporosis    Past Surgical History:  Past Surgical History  Procedure Date  . Coronary artery bypass graft 1996  . Inguinal hernia repair 1996    right, hx  . Transurethral resection of prostate   . Ptca     percutaneous transluminal coronary intervention and brachy therapy, Bruce R. Juanda Chance, MD. EF 60%  . Laparotomy 04/27/2012    Procedure: EXPLORATORY LAPAROTOMY;  Surgeon: Cherylynn Ridges, MD;  Location: [facility] OR;  Service: General;  Laterality: N/A;  . Esophagogastroduodenoscopy 05/08/2012    Procedure: ESOPHAGOGASTRODUODENOSCOPY (EGD);  Surgeon: Charna Elizabeth, MD;  Location: [facility] ENDOSCOPY;  Service: Endoscopy;  Laterality: N/A;  Gunnar Fusi put on for Dr. Loreta Ave ,  Dr. Loreta Ave will call if she wants another time   HPI:  78 y/o male with CAD, cingenital pectus excavatum with recent admission for SBO/lysis of adhesions brought to [facility] ED after suffering VF cardiac arrest and ROSC after brief CPR and shocks x3.  PE. Seen today for SLE   Assessment / Plan / Recommendation Clinical Impression  Pt. seen for cognitive-linguistic evaluation, he is alert and pleasant at bedside requiring minimal verbal cues to participate in assessment. Attention, auditory comprehension, verbal expression and speech intelligibility appear WFL for the tasks assessed. Pt. states difficulty remembering recent events as evidenced by impairments during test items and in functional setting.  Pt and daughter report this is not his baseline. Pt. would benefit from ST at next venue of care (pt. likely transferring to [facility] inpatient rehab) to address mild-moderate deficits in memory and problem solving.     SLP Assessment  All further Speech Lanaguage Pathology  needs can be addressed in the next venue of care    Follow Up Recommendations  Inpatient Rehab               SLP Goals  SLP Goals Progress/Goals/Alternative treatment plan discussed with pt/caregiver and they: Agree  SLP Evaluation Prior Functioning  Cognitive/Linguistic Baseline: Within functional limits Type of Home: House Lives With: Alone Available Help at Discharge: Family;Available PRN/intermittently Vocation: Retired   IT consultant  Overall Cognitive Status: Impaired Arousal/Alertness: Awake/alert Orientation Level: Oriented to person;Oriented to time;Oriented to place;Oriented to situation Attention: Sustained Memory: Impaired Memory Impairment: Storage deficit;Retrieval  deficit;Decreased recall of new information;Prospective memory;Decreased short term memory Decreased Short Term Memory: Functional basic;Verbal basic Awareness: Appears intact Problem Solving: Impaired Problem Solving Impairment: Verbal  basic;Functional basic Executive Function: Self Monitoring;Self Correcting Self Monitoring: Impaired Self Monitoring Impairment: Verbal basic;Functional basic Self Correcting: Impaired Self Correcting Impairment: Verbal basic;Functional basic Safety/Judgment: Appears intact    Comprehension  Auditory Comprehension Overall Auditory Comprehension: Appears within functional limits for tasks assessed Yes/No Questions: Not tested Commands: Not tested Conversation: Simple Interfering Components: Working Nutritional therapist Discrimination: Not tested Reading Comprehension Reading Status: Not tested    Expression Expression Primary Mode of Expression: Verbal Verbal Expression Overall Verbal Expression: Appears within functional limits for tasks assessed Initiation: No impairment Level of Generative/Spontaneous Verbalization: Conversation Naming: Not tested Pragmatics: No impairment Effective Techniques: Semantic cues;Open ended questions Non-Verbal Means of Communication: Not applicable Written Expression Dominant Hand: Right Written Expression: Not tested   Oral / Motor Oral Motor/Sensory Function Overall Oral Motor/Sensory Function: Appears within functional limits for tasks assessed Labial ROM: Within Functional Limits Labial Symmetry: Within Functional Limits Labial Strength: Within Functional Limits Labial Sensation: Within Functional Limits Lingual ROM: Within Functional Limits Lingual Symmetry: Within Functional Limits Lingual Strength: Within Functional Limits Lingual Sensation: Within Functional Limits Facial ROM: Within Functional Limits Facial Symmetry: Within Functional Limits Facial Strength: Within Functional Limits Facial Sensation: Within Functional Limits Velum: Within Functional Limits Mandible: Within Functional Limits Motor Speech Overall Motor Speech: Appears within functional limits for tasks assessed Respiration:  Within functional limits Phonation: Normal Resonance: Within functional limits Articulation: Within functional limitis Intelligibility: Intelligible Motor Planning: Witnin functional limits Motor Speech Errors: Not applicable   GO     Louis Reid 05/18/2012, 2:41 PM

## 2012-05-19 ENCOUNTER — Telehealth: Payer: Self-pay | Admitting: Cardiology

## 2012-05-19 LAB — PROTIME-INR
INR: 2.67 — ABNORMAL HIGH (ref 0.00–1.49)
Prothrombin Time: 27.1 seconds — ABNORMAL HIGH (ref 11.6–15.2)

## 2012-05-19 MED ORDER — LISINOPRIL 2.5 MG PO TABS
2.5000 mg | ORAL_TABLET | Freq: Every day | ORAL | Status: DC
  Administered 2012-05-19 – 2012-05-20 (×2): 2.5 mg via ORAL
  Filled 2012-05-19 (×2): qty 1

## 2012-05-19 MED ORDER — SPIRONOLACTONE 12.5 MG HALF TABLET
12.5000 mg | ORAL_TABLET | Freq: Every day | ORAL | Status: DC
  Administered 2012-05-19 – 2012-05-20 (×2): 12.5 mg via ORAL
  Filled 2012-05-19 (×2): qty 1

## 2012-05-19 MED ORDER — WARFARIN SODIUM 2 MG PO TABS
2.0000 mg | ORAL_TABLET | Freq: Once | ORAL | Status: AC
  Administered 2012-05-19: 2 mg via ORAL
  Filled 2012-05-19: qty 1

## 2012-05-19 NOTE — Progress Notes (Signed)
[facility] Progress Note    Louis Reid FAO:[id number] DOB: 09-06-1932 DOA: 05/10/2012 PCP: [name], MD  Brief narrative: 78 year old male patient admitted on 06/06/2012 with ventricular fibrillation cardiac arrest secondary cardiac ischemia. He required mechanical ventilation and was started on the hypothermia protocol. CT of the chest was completed on 05/12/2012 and revealed a small pulmonary embolism. Lower extremity Dopplers revealed no evidence of DVT. Patient had recently been hospitalized for 11 days and was discharged 05/09/2012 after small bowel obstruction that required support her laparotomy with lysis of adhesions. Patient eventually underwent cardiac catheterization with intervention to an occluded LAD. He stabilized enough to be weaned off of pressors and was extubated and deemed appropriate to transfer to telemetry on 05/16/2012. Team 1 assumed care of this patient on 05/17/2012  SIGNIFICANT EVENTS:  12/3 Bought to [facility] ED after suffering VF cardiac arrest and ROSC after brief CPR and shocks x 3. Hypothermia protocol started.  12/3- cath- PTCA (balloon angioplasty) only proximal/mid LAD Thrombectomy occluded LAD  12/4- hypothermia continuous, oozing mouth, urine  12/5-acidosis, bleeding resolved, shock worse  12/5 CT chest - lingula PE, small efusions, Ct head - neg acute  12/5 CT abndo/eplvis - Distended gallbladder, containing sludge versus vicariously<BR>excreted contrast.<BR>3. Resolution of small bowel dilatation since prior exam. No<BR>evidence of abscess  12/6- follows commands on rewarm  12/7- low dose pressor needs  12/8- neg 2.1 liters   Assessment/Plan:  Ventricular fibrillation arrest due to: Anterior MI/Coronary artery disease s/p PTCA LAD--History of coronary artery bypass surgery  Management by cardiology  Status post balloon angioplasty/thrombectomy to occluded LAD (no stent). Continue aspirin 81 mg, Brilinta, and atorvastatin 80 mg. Per  cardiology, envision short course of aspirin 81 mg + Brilinta + Coumadin approximately one month followed by Coumadin + aspirin 81 mg until he can come off Coumadin.  Life Vest at discharge.  Repeat echo in a month and? ICD.    Acute respiratory failure with hypoxia 2/2 cardiac arrest, pulmonary edema and PE.  Resolved.   Cardiogenic shock  Resolved   HYPERTENSION  Soft blood pressures. Limiting use of ACE inhibitors or titration of Coreg.   Acute encephalopathy 2/2 cardiac arrest  Improving. Per family at bedside, patient has memory deficits.  Discharge to inpatient rehabilitation 12/13   Acute systolic congestive heart failure, NYHA class 3/EF 20-25%  Compensated-management per cardiology  Acute pulmonary embolism-small  Lower extremity Dopplers are negative and patient had a tiny focal pulmonary embolism  Pulmonology recommends anticoagulation with Coumadin for 6 months.  Pharmacy managing Coumadin  INR therapeutic for 2 days. Heparin discontinued 12/12   Physical deconditioning  D/C to inpatient rehabilitation services on 12/13   HYPERLIPIDEMIA  Continue statin   Hyperglycemia  A1C 5.9 suggesting prediabetes.   Anemia of critical illness  Follow hemoglobin- would only transfuse if hemoglobin less than or equal to 7.5.   Stable.   Anorexia  Nutrition evaluation   DVT prophylaxis: Anticoagulated on Coumadin Code Status: Partial code with no CPR and no defibrillation Family Communication: D/W son and daughter in law on 12/11. Disposition Plan: Discharge to CIR 12/13  Consultants: Cardiology Pulmonary  Procedures: 12/3 L [place] CVL >>> 12/3 cardiac catheterization with balloon angioplasty to proximal and mid LAD  CULTURES:  12/5 BC>>>  12/5 sputum>>>negative  Antibiotics: vanc 12/5>>>12/9  Zosyn 12/5>>>12/9  myco 12/5>>>12/6   HPI/Subjective: Appetite improving. Says ate well at breakfast. Denies chest pain or  dyspnea.   Objective: Blood pressure 114/65, pulse 80, temperature 97.4 F (36.3  C), temperature source Oral, resp. rate 20, height 5\' 9"  (1.753 m), weight 54.296 kg (119 lb 11.2 oz), SpO2 96.00%.  Intake/Output Summary (Last 24 hours) at 05/19/12 1430 Last data filed at 05/19/12 0813  Gross per 24 hour  Intake    120 ml  Output   1300 ml  Net  -1180 ml     Exam: General: Comfortable. Sitting upon returning chair. Lungs: Clear to auscultation bilaterally. No increased work of breathing. Cardiovascular: Regular rate and rhythm without murmur gallop or rub normal S1 and S2, no JVD and no peripheral edema. Telemetry shows sinus rhythm. Abdomen: Nontender, nondistended, soft, bowel sounds positive, no rebound, no ascites, no appreciable mass Extremities: Symmetric 5 x 5 power Neurological: Patient is alert and oriented to name and place only. No focal neurological deficits.  Data Reviewed: Basic Metabolic Panel:  Lab 05/18/12 6295 05/17/12 0520 05/16/12 0420 05/15/12 1947 05/15/12 1600 05/15/12 1140 05/13/12 0506  NA 143 141 142 -- 144 145 --  K 3.7 3.3* 4.0 -- 4.1 3.2* --  CL 107 105 108 -- 110 107 --  CO2 28 28 28  -- 27 25 --  GLUCOSE 106* 99 101* -- 109* 109* --  BUN 20 22 25* -- 26* 25* --  CREATININE 1.05 1.09 1.05 -- 0.99 0.85 --  CALCIUM 8.4 8.6 8.0* -- 7.3* 6.8* --  MG -- -- 2.0 1.9 -- -- 2.1  PHOS -- -- 1.5* -- -- -- --   Liver Function Tests:  Lab 05/14/12 0500 05/13/12 0506  AST 60* 111*  ALT 39 50  ALKPHOS 70 68  BILITOT 0.3 0.2*  PROT 4.8* 4.5*  ALBUMIN 1.9* 1.9*   No results found for this basename: LIPASE:5,AMYLASE:5 in the last 168 hours No results found for this basename: AMMONIA:5 in the last 168 hours CBC:  Lab 05/18/12 0410 05/17/12 0520 05/16/12 0420 05/15/12 1947 05/15/12 0435 05/14/12 0500 05/13/12 0506  WBC 7.3 6.2 8.6 10.9* 15.5* -- --  NEUTROABS -- -- 7.6 9.9* 14.3* 17.0* 12.1*  HGB 9.0* 9.5* 9.2* 8.8* 9.4* -- --  HCT 27.1* 28.2* 27.8*  26.4* 28.5* -- --  MCV 93.4 93.7 95.9 94.6 96.0 -- --  PLT 211 219 219 224 250 -- --   Cardiac Enzymes:  Lab 05/16/12 0747 05/16/12 0147 05/15/12 1947  CKTOTAL -- -- --  CKMB -- -- --  CKMBINDEX -- -- --  TROPONINI 14.16* 15.02* 13.28*   BNP (last 3 results) No results found for this basename: PROBNP:3 in the last 8760 hours CBG:  Lab 05/16/12 0804 05/16/12 0414 05/16/12 05/15/12 2051 05/15/12 1614  GLUCAP 106* 78 111* 104* 108*    Recent Results (from the past 240 hour(s))  CULTURE, RESPIRATORY     Status: Normal   Collection Time   05/11/12  9:40 AM      Component Value Range Status Comment   Specimen Description OTHER   Final    Special Requests ETT SUCTION   Final    Gram Stain     Final    Value: MODERATE WBC PRESENT, PREDOMINANTLY PMN     FEW SQUAMOUS EPITHELIAL CELLS PRESENT     MODERATE GRAM NEGATIVE COCCI     FEW GRAM NEGATIVE RODS   Culture Non-Pathogenic Oropharyngeal-type Flora Isolated.   Final    Report Status 05/13/2012 FINAL   Final   CULTURE, BLOOD (ROUTINE X 2)     Status: Normal   Collection Time   05/11/12 11:29 AM  Component Value Range Status Comment   Specimen Description BLOOD LEFT ARM   Final    Special Requests BOTTLES DRAWN AEROBIC AND ANAEROBIC 10CC   Final    Culture  Setup Time 05/11/2012 21:55   Final    Culture     Final    Value:        BLOOD CULTURE RECEIVED NO GROWTH TO DATE CULTURE WILL BE HELD FOR 5 DAYS BEFORE ISSUING A FINAL NEGATIVE REPORT   Report Status 05/17/2012 FINAL   Final   CULTURE, BLOOD (ROUTINE X 2)     Status: Normal   Collection Time   05/11/12 11:39 AM      Component Value Range Status Comment   Specimen Description BLOOD LEFT FOREARM   Final    Special Requests BOTTLES DRAWN AEROBIC ONLY 10CC   Final    Culture  Setup Time 05/11/2012 21:55   Final    Culture     Final    Value:        BLOOD CULTURE RECEIVED NO GROWTH TO DATE CULTURE WILL BE HELD FOR 5 DAYS BEFORE ISSUING A FINAL NEGATIVE REPORT   Report  Status 05/17/2012 FINAL   Final      Studies:  Recent x-ray studies have been reviewed in detail by the Attending Physician  Scheduled Meds:  Reviewed in detail by the Attending Physician   Cederic Mozley 2:30 PM Pager: [phone]  If 7PM-7AM, please contact night-coverage [email or web address] Password [email or web address] 05/19/2012, 2:30 PM   LOS: 9 days

## 2012-05-19 NOTE — Progress Notes (Signed)
CARDIAC REHAB PHASE I   PRE:  Rate/Rhythm: 86 SR    BP: sitting 100/70    SaO2: 98 RA  MODE:  Ambulation: 50 ft   POST:  Rate/Rhythm: 91 SR    BP: sitting 110/80     SaO2: 94 RA  Pt very clear headed today. Able to walk 50 ft outside of room with RW, assist x2 min assist with gait belt. Pt very focused on following commands appropriately. Fairly steady with slow pace. Slight LOB x2. Reminders to stand tall. Wanted to look at feet entire walk to ensure correct placement. Return to recliner. Fatigued but not SOB. Will continue to follow.  [phone]  Harriet Masson CES, ACSM

## 2012-05-19 NOTE — Progress Notes (Signed)
SLP has reviewed and agrees with student's note below.  Louis Reid Louis Reid M.Ed CCC-SLP Pager [phone] 05/19/2012    

## 2012-05-19 NOTE — Telephone Encounter (Signed)
lmom for patient's son to call me back or he can meet me at the hospital at 7pm tonight

## 2012-05-19 NOTE — Progress Notes (Signed)
Speech Language Pathology Discharge Patient Details Name: Louis Reid MRN: [id number] DOB: June 20, 1932 Today's Date: 05/19/2012 Time: 2130-8657 SLP Time Calculation (min): 12 min  Patient discharged from SLP services secondary to goals met and no further SLP needs identified.  Please see latest therapy progress note for current level of functioning and progress toward goals.    Progress and discharge plan discussed with patient and/or caregiver: Patient/Caregiver agrees with plan  GO     Royce Macadamia 05/19/2012, 1:24 PM

## 2012-05-19 NOTE — Progress Notes (Signed)
I just spoke with son by phone. He went to the website for the lifevest and thought he was responsible for ordering it. I explained that the RN CM on unit 2000 was arranging the Life vest delivery. Insurance has approved admission to inpt rehab and we await the delivery to admit pt, today or tomorrow pending the delivery. [phone]

## 2012-05-19 NOTE — PMR Pre-admission (Signed)
PMR Admission Coordinator Pre-Admission Assessment  Patient: Louis Reid is an 78 y.o., male MRN: [id number] DOB: [birth year] Height: 5\' 9"  (175.3 cm) Weight: 54.296 kg (119 lb 11.2 oz)              Insurance Information HMO: yes    PPO:      PCP:      IPA:      80/20:      OTHER: medicare replacement policy PRIMARY: AARP Medicare      Policy#: [id number]      Subscriber: pt CM Name: Misty Stanley      Phone#: [phone]     Fax#: [phone] Pre-Cert#: [id number]      Employer: retired.Marland Kitchen onsite reviewer will be [phone] Benefits:  Phone #: [phone]     Name: 12/11 Ashely Eff. Date: 06/09/11 active     Deduct: none      Out of Pocket Max: $3900      Life Max: none CIR: $220 per day days 1 thru 7, then no copay      SNF: $50 per day days 1 thru 20, $150 per day days 21 thru 40, no copay days 41 plus. 100 days max per benefit period Outpatient: $40 copay per visit no visit limit     Co-Pay:  Home Health: 100%      Co-Pay:  No visit limit DME: 80%     Co-Pay: 20% Providers: in network  SECONDARY:   none   Medicaid Application Date:       Case Manager:  Disability Application Date:       Case Worker:   Emergency Conservator, museum/gallery Information    Name Relation Home Work Mobile   [place] [address] [place] [phone]  [phone]     Current Medical History  Patient Admitting Diagnosis: deconditioning after cardiac arrest, multiple medical, anoxic encephalopathy  History of Present Illness:HPI: Louis Reid is a 78 y.o. male with history of CAD, recent admission for SBO with lysis of adhesions, who was admitted on 05/10/12 past witnessed cardiac arrest. Bystander CPR X 10 mins , AED shock X3. Was noted to be in atrial fibrillation and intubated in ED. He was taken to cath lab and underwent PTCA with thrombectomy of occluded LAD by Dr. Clifton James. He developed hematuria/bloody oral secretions with intermittent VT. Hypokalemia supplemented and integrelin discontinued. CT head done  due to lethargy and showed no acute changes. Was started on IV antibiotics for leucocytosis. CT chest with tiny PE in lingula and CT abdomen with distended GB with mild ascites and diffuse body wall edema. Started on heparin drip for PE. BLE dopplers negative for DVT. Tolerated extubation 12/08 and encephalopathy slowly resolving. Diet initiated 12/9 due to improvement in mentation and no s/s of aspiration. Patient noted to have difficulty following commands with short term memory deficits as well as balance deficits with impaired mobility.  Cardiology plans: outlined 05/18/2012  78 yo with history of CAD s/p CABG had recent SBO with lysis of adhesions. Was readmitted with vfib arrest (cardioverted with ROSC) and anterior STEMI. He was started on hypothermia protocol and had balloon angioplasty/thrombectomy to occluded LAD (had atretic LIMA). EF 20-25% by echo.  1. CAD: S/p ballooon angioplasty/thrombectomy to occluded LAD (no stent). LIMA known to be atretic. He is currently on ASA 81 and Brilinta which we will continue for now. Continue atorvastatin 80. He is also going to need coumadin given PE. He had angioplasty without stenting => therefore, would envision short course of ASA  81 mg + Brilinta + coumadin approximately 1 month followed by coumadin + ASA 81 until he can come off coumadin.  2. Hypotension: Improved. BP still soft. This limits titration of coreg or addition of ACEi.  3. Acute systolic CHF: EF 16-10%. Volume improved significantly. I/O negative 1500 cc. Weight down. Will reduce lasix to 20 mg daily.  4. Acute respiratory failure/ventilated: Pulmonary edema, small PE (post-op), congenital pectus.  - Continue to diurese.  - heparin gtt for PE need to overlap 24-48 hrs. INR 2.01 today.  5. PE: Small, post-operative from lysis of adhesions operation. On heparin gtt. Adjusting coumadin.  6. Vfib arrest: In setting of anterior MI treated with angioplasty. Life vest at discharge/ transfer to CIR  with repeat echo in a month for ? ICD. On low dose Coreg. Will contact life vest to fit patient. Lifevest 05/19/12 pm 7. Anoxic encephalopathy post arrest with memory loss, balance deficits, and impaired mobility. Plan for CIR.   Past Medical History  Past Medical History  Diagnosis Date  . Acute myocardial infarction of anterolateral wall, episode of care unspecified   . Coronary atherosclerosis of artery bypass graft   . Sinoatrial node dysfunction   . HTN (hypertension)   . HLD (hyperlipidemia)   . Depression   . History of kidney stones   . Congenital funnel chest   . Inguinal hernia   . Osteoporosis     Family History  family history includes Colon cancer in his brother; Diabetes in his brother; Heart disease in his brother and father; and Other in his mother.  Prior Rehab/Hospitalizations: none   Current Medications  Current facility-administered medications:acetaminophen (TYLENOL) tablet 650 mg, 650 mg, Oral, Q4H PRN, Kathleene Hazel, MD;  antiseptic oral rinse (BIOTENE) solution 15 mL, 1 application, Mouth Rinse, QID, Lonia Farber, MD, 15 mL at 05/19/12 1325;  aspirin chewable tablet 81 mg, 81 mg, Oral, Daily, Ok Anis, NP, 81 mg at 05/19/12 1030 atorvastatin (LIPITOR) tablet 80 mg, 80 mg, Per Tube, q1800, Laurey Morale, MD, 80 mg at 05/18/12 1647;  carvedilol (COREG) tablet 3.125 mg, 3.125 mg, Oral, BID WC, Jessica A Hope, [name], 3.125 mg at 05/19/12 9604;  chlorhexidine (PERIDEX) 0.12 % solution 15 mL, 15 mL, Mouth/Throat, BID, Nelda Bucks, MD, 15 mL at 05/19/12 1030 feeding supplement (ENSURE COMPLETE) liquid 237 mL, 237 mL, Oral, BID BM, Tonye Becket, RD, 237 mL at 05/19/12 1030;  fentaNYL (SUBLIMAZE) injection 100 mcg, 100 mcg, Intravenous, Q2H PRN, Lonia Farber, MD, 100 mcg at 05/16/12 0849;  furosemide (LASIX) tablet 20 mg, 20 mg, Oral, Daily, Peter M [place], MD, 20 mg at 05/19/12 1022 lisinopril (PRINIVIL,ZESTRIL) tablet  2.5 mg, 2.5 mg, Oral, Daily, Gaylord Shih, MD, 2.5 mg at 05/19/12 1022;  ondansetron (ZOFRAN) injection 4 mg, 4 mg, Intravenous, Q6H PRN, Kathleene Hazel, MD;  pantoprazole (PROTONIX) EC tablet 40 mg, 40 mg, Oral, Q1200, Coralyn Helling, MD, 40 mg at 05/19/12 1325;  spironolactone (ALDACTONE) tablet 12.5 mg, 12.5 mg, Oral, Daily, Gaylord Shih, MD, 12.5 mg at 05/19/12 1318 Ticagrelor (BRILINTA) tablet 90 mg, 90 mg, Oral, BID, Kathleene Hazel, MD, 90 mg at 05/19/12 1023;  [COMPLETED] warfarin (COUMADIN) tablet 2 mg, 2 mg, Oral, ONCE-1800, Dennie Fetters, RPH, 2 mg at 05/18/12 1647;  warfarin (COUMADIN) tablet 2 mg, 2 mg, Oral, ONCE-1800, Brett Fairy, PHARMD;  warfarin (COUMADIN) video, , Does not apply, Once, Nita Altamese Currie, [facility] Warfarin - Pharmacist Dosing Inpatient, , Does not apply,  [id number], Chinita Greenland, PHARMD;  zolpidem [facility]) tablet 5 mg, 5 mg, Oral, QHS PRN, Nelda Bucks, MD;  [DISCONTINUED] 0.9 %  sodium chloride infusion, , Intravenous, Continuous, Lonia Farber, MD, Last Rate: 10 mL/hr at 05/18/12 2213;  [DISCONTINUED] furosemide (LASIX) tablet 40 mg, 40 mg, Oral, Daily, Jessica A Hope, [name], 40 mg at 05/18/12 1029 [DISCONTINUED] heparin ADULT infusion 100 units/mL (25000 units/250 mL), 1,000 Units/hr, Intravenous, Continuous, Veronda Hollie Salk, [place], Last Rate: 10 mL/hr at 05/18/12 2213, 1,000 Units/hr at 05/18/12 2213  Patients Current Diet: General  Precautions / Restrictions Precautions Precautions: Fall Precaution Comments: postural sway, mild posterior lean Restrictions Weight Bearing Restrictions: No   Prior Activity Level Limited Community (1-2x/wk): limited over the last few months  Home Assistive Devices / Equipment Home Assistive Devices/Equipment: None Home Adaptive Equipment: None  Prior Functional Level Prior Function Level of Independence: Independent Able to Take Stairs?: Yes Driving: Yes Vocation:  Retired Comments: states he was living in [employer]  Current Functional Level Cognition  Arousal/Alertness: Awake/alert Overall Cognitive Status: Impaired Overall Cognitive Status: Impaired Current Attention Level: Focused Attention - Other Comments: Continues to req constant step-by-step cueing for task, easily and quickly forgets what the plan was Memory:  (decr recall of DME instructions from prior day) Orientation Level: Oriented to person;Oriented to place Following Commands: Follows one step commands consistently;Follows multi-step commands inconsistently Awareness of Errors: Assistance required to identify errors made;Assistance required to correct errors made Attention: Sustained Memory: Impaired Memory Impairment: Storage deficit;Retrieval deficit;Decreased recall of new information;Prospective memory;Decreased short term memory Decreased Short Term Memory: Functional basic;Verbal basic Awareness: Appears intact Problem Solving: Impaired Problem Solving Impairment: Verbal basic;Functional basic Executive Function: Self Monitoring;Self Correcting Self Monitoring: Impaired Self Monitoring Impairment: Verbal basic;Functional basic Self Correcting: Impaired Self Correcting Impairment: Verbal basic;Functional basic Safety/Judgment: Appears intact    Extremity Assessment (includes Sensation/Coordination)  RUE ROM/Strength/Tone: WFL for tasks assessed  RLE ROM/Strength/Tone: Deficits RLE ROM/Strength/Tone Deficits: Grossly 3/5 RLE Sensation: WFL - Light Touch    ADLs  Grooming: Performed;Min guard;Set up;Brushing hair Where Assessed - Grooming: Supported standing Upper Body Bathing: Simulated;Minimal assistance Where Assessed - Upper Body Bathing: Unsupported sitting Lower Body Bathing: Simulated;Maximal assistance Where Assessed - Lower Body Bathing: Supported sit to stand Upper Body Dressing: Simulated;Minimal assistance Where Assessed - Upper Body Dressing:  Unsupported sitting Lower Body Dressing: Simulated;Maximal assistance Where Assessed - Lower Body Dressing: Supported sit to stand Toilet Transfer: Performed;Minimal assistance Toilet Transfer Method: Sit to Barista: Raised toilet seat with arms (or 3-in-1 over toilet) Toileting - Clothing Manipulation and Hygiene: Performed;Moderate assistance (for thoroughness with back peri hygeine.) Where Assessed - Toileting Clothing Manipulation and Hygiene: Sit to stand from 3-in-1 or toilet Equipment Used: Rolling walker;Gait belt Transfers/Ambulation Related to ADLs: Pt ambulated to the bathroom with min A and constant VCs for posture, safety and to stay within confines of RW. ADL Comments: Pt fatigues quickly. Tolerated unsupported standing at sink for 1-2 min to wash hands after using bathroom but had to sit and comb hair.    Mobility  Bed Mobility: Supine to Sit;Sitting - Scoot to Edge of Bed Rolling Left: 6: Modified independent (Device/Increase time);With rail Left Sidelying to Sit: 5: Supervision;HOB elevated Sitting - Scoot to Edge of Bed: 6: Modified independent (Device/Increase time)    Transfers  Transfers: Sit to Stand;Stand to Sit Sit to Stand: 3: Mod assist;With upper extremity assist;From bed Stand to Sit: 3: Mod assist;With upper extremity assist;To chair/3-in-1;To toilet;To bed  Ambulation / Gait / Stairs / Wheelchair Mobility  Ambulation/Gait Ambulation/Gait Assistance: 3: Mod assist Ambulation Distance (Feet): 40 Feet Assistive device: Rolling walker Ambulation/Gait Assistance Details: Cues for correct DME with improved recall for several steps, until posture deteriorates and he forgets all cues, pt continues to ask for assurance he is moving as he is told Gait Pattern: Step-to pattern;Decreased stride length;Trunk flexed Gait velocity: decr General Gait Details: trunk flexion incr over time, req cues to positionally readjust Stairs: No Wheelchair  Mobility Wheelchair Mobility: No    Posture / Balance Static Sitting Balance Static Sitting - Balance Support: No upper extremity supported;Feet supported Static Sitting - Level of Assistance: 5: Stand by assistance Static Sitting - Comment/# of Minutes: 3-5 min donning/doffing socks, posterior and right lean; pt states he feels centered Static Standing Balance Static Standing - Balance Support: Bilateral upper extremity supported Static Standing - Level of Assistance: 4: Min assist Static Standing - Comment/# of Minutes: prior to ambulations- mild postural sway  Dynamic Standing Balance Dynamic Standing - Balance Support: No upper extremity supported;During functional activity Dynamic Standing - Level of Assistance: 4: Min assist Dynamic Standing - Balance Activities: Other (comment) (Self -care; cleaning self after toileting and washing hands) Dynamic Standing - Comments: 3-31min for self care, req cues and assist for balance and sequencing    Special needs/care consideration BiPAP/CPAP no CPM no Continuous Drip IV no Dialysis no        Days no Life Vest yes at d/c from acute 05/19/12 at bedside Oxygen no Special Bed no Trach Size no Wound Vac (area) no      Location no Skin                               Location no Bowel mgmt: incont  Aware after BMS, not before Bladder mgmt: condom catheter Diabetic mgmt      Previous Home Environment Living Arrangements: Children;Alone (son staying with him since d/c 05/09/12 hospitalization) Lives With: Alone Available Help at Discharge:  (son just 1 to 2 weeks;vs need for SNF longer term) Type of Home: House Home Layout: One level Home Access: Stairs to enter Entrance Stairs-Rails: None Entrance Stairs-Number of Steps: 3-4 Bathroom Shower/Tub: Associate Professor: Yes How Accessible: Accessible via walker Home Care Services: No  Discharge Living Setting Plans for Discharge Living  Setting: Patient's home (son can stay with him 1 to 2 weeks vs SNF after CIR) Type of Home at Discharge: House Discharge Home Layout: One level Discharge Home Access: Stairs to enter Entrance Stairs-Rails: None Entrance Stairs-Number of Steps: 3 to 4 Discharge Bathroom Shower/Tub: Tub/shower unit Discharge Bathroom Toilet: Standard Discharge Bathroom Accessibility: Yes How Accessible: Accessible via walker Do you have any problems obtaining your medications?: No  Social/Family/Support Systems Patient Roles: Parent Contact Information: Gwen Pounds, son Anticipated Caregiver: [name] for 1 to 2 weeks only. If needed longer, due to supervision, will need SNF Anticipated Caregiver's Contact Information: home [phone]; cell [phone] Ability/Limitations of Caregiver: 24/7 supervision for 1 to 2 weeks. he is unemployed Medical laboratory scientific officer: 24/7 Discharge Plan Discussed with Primary Caregiver: Yes Is Caregiver In Agreement with Plan?: Yes Does Caregiver/Family have Issues with Lodging/Transportation while Pt is in Rehab?: No    Goals/Additional Needs Patient/Family Goal for Rehab: Supervision with PT and OT, supervision to Mod I SLP Expected length of stay: [facility] 7 to 10 days Dietary Needs: very poor appetite over last  few months and since postop last admission. Lost 25 lbs. Equipment Needs: Life Vest and follow up with cardiology concerning ICD placement Pt/Family Agrees to Admission and willing to participate: Yes Program Orientation Provided & Reviewed with Pt/Caregiver Including Roles  & Responsibilities: Yes   Decrease burden of Care through IP rehab admission: specialized equipment of Life vest and pt / family education.    Possible need for SNF placement upon discharge: Son, [name] seems very hesitant about Life vest at home. I spoke with him 05/18/12 and 05/19/12 to encourage him to call Dr. Daleen Squibb, pt's cardiologist, to discuss implications. Son can provide 24/7  supervision for one to two weeks after d/c but not long term . Patient lives in [place] and son lives in [place]. He is open to SNF after CIR admit if recommended.  I spoke with [facility] for cardiology unit and [facility] have admitted a pt with a Life Vest in the past month.   Patient Condition: Please see physician update to information in consult dated 05/17/2012.  Preadmission Screen Completed By:  Clois Dupes, 05/19/2012 1:38 PM ______________________________________________________________________   Discussed status with Dr. Riley Kill on 05/20/12 at  [phone] and received telephone approval for admission today.  Admission Coordinator:  Clois Dupes, time 9604 Date 05/20/12.

## 2012-05-19 NOTE — Telephone Encounter (Signed)
I have spoken with patients son and he feels it will be okay to proceed with fitting tonight without him present  I will do the fitting tonight

## 2012-05-19 NOTE — Telephone Encounter (Signed)
plz return call to pt son [name] [phone] or [phone] cell, regarding pt order for lifewatch vest.  Pt recently had heart attack, son was given paperwork to fill out to receive lifewatch vest but is having some difficulty answering some of the medical questions that need to be answered on the application.  plz return call to son at the numbers listed above.

## 2012-05-19 NOTE — Progress Notes (Signed)
I await insurance decision on approval for inpt rehab admission as well as LifeVest application. [phone]

## 2012-05-19 NOTE — Progress Notes (Signed)
ANTICOAGULATION CONSULT NOTE - Follow Up Consult  Pharmacy Consult for Heparin and Coumadin Indication: pulmonary embolus  Allergies  Allergen Reactions  . Cold Medicine Plus (Chlorphen-Pseudoephed-Apap)     COLD MEDICATIONS MAKE HIM "LOOPY"    Patient Measurements: Height: 5\' 9"  (175.3 cm) Weight: 119 lb 11.2 oz (54.296 kg) IBW/kg (Calculated) : 70.7  Heparin Dosing Weight: 51.6 kg  Vital Signs: Temp: 97.4 F (36.3 C) (12/12 0424) Temp src: Oral (12/12 0424) BP: 108/69 mmHg (12/12 0424) Pulse Rate: 80  (12/12 0424)  Labs:  Basename 05/19/12 0500 05/18/12 0410 05/17/12 0520  HGB -- 9.0* 9.5*  HCT -- 27.1* 28.2*  PLT -- 211 219  APTT -- -- --  LABPROT 27.1* 22.0* 15.5*  INR 2.67* 2.01* 1.25  HEPARINUNFRC 0.43 0.34 0.47  CREATININE -- 1.05 1.09  CKTOTAL -- -- --  CKMB -- -- --  TROPONINI -- -- --    Estimated Creatinine Clearance: 45.3 ml/min (by C-G formula based on Cr of 1.05).  Assessment: Day # 4 overlap Heparin and Coumadin. Heparin level is therapeutic on 1000 units/hr. INR at goal but rapid rise with only 3 doses of coumadin. H/H slightly down yesterday, but has been low. Platelet count stable. No bleeding noted. Also on Aspirin 81 mg and Brilinta 90 mg BID. Noted short course of Brilinta planned; may stop Aspirin while on Coumadin, if Hgb trends down.  Goal of Therapy:  INR 2-3 Heparin level 0.3-0.7 units/ml Monitor platelets by anticoagulation protocol: Yes   Plan:   Continue heparin drip at 1000 units/hr.  Coumadin 2 mg po today.  Continue daily heparin level, PT/INR and CBC.  Will follow up Hgb trend and +/- continue Aspirin.  Thank you,  Brett Fairy, PharmD, BCPS 05/19/2012 8:30 AM

## 2012-05-19 NOTE — Progress Notes (Signed)
Speech Language Pathology Dysphagia Treatment Patient Details Name: Louis Reid MRN: [id number] DOB: 1932-06-17 Today's Date: 05/19/2012 Time: 5621-3086 SLP Time Calculation (min): 12 min  Assessment / Plan / Recommendation Clinical Impression  Pt. seen for dysphagia treatment focusing utilization of compensatory strategies and tolerance of upgraded PO trials. Pt. is alert and cooperative, requiring minimal verbal cues to implement comepnsatory strategies.  Pt. also stated he has not had difficulty masticating Dys. 3 diet and his appetite has improved from previous day. No s/s of aspiration observed with thin liquids. Mastication also appeared [facility] with regular texture.  Recommend regular texture diet with thin liquids.No further f/u is warranted at this time. Pt is likely transfering to rehab, recommend ST to address cognitive-linguistic deficits.     Diet Recommendation  Initiate / Change Diet: Regular;Thin liquid    SLP Plan All goals met      Swallowing Goals  SLP Swallowing Goals Patient will consume recommended diet without observed clinical signs of aspiration with: Minimal assistance Swallow Study Goal #1 - Progress: Met Patient will utilize recommended strategies during swallow to increase swallowing safety with: Minimal assistance Swallow Study Goal #2 - Progress: Met  General Temperature Spikes Noted: No Respiratory Status: Room air Behavior/Cognition: Alert;Cooperative;Pleasant mood Oral Cavity - Dentition: Adequate natural dentition Patient Positioning: Upright in chair  Oral Cavity - Oral Hygiene Does patient have any of the following "at risk" factors?: None of the above Brush patient's teeth BID with toothbrush (using toothpaste with fluoride): Yes Patient is HIGH RISK - Oral Care Protocol followed (see row info): Yes Patient is AT RISK - Oral Care Protocol followed (see row info): Yes   Dysphagia Treatment Treatment focused on: Upgraded PO texture  trials;Patient/family/caregiver education;Utilization of compensatory strategies Treatment Methods/Modalities: Skilled observation Patient observed directly with PO's: Yes Type of PO's observed: Regular;Thin liquids Feeding: Able to feed self Liquids provided via: Cup;No straw Type of cueing: Verbal Amount of cueing: Minimal   GO     Louis Reid 05/19/2012, 1:23 PM

## 2012-05-19 NOTE — Progress Notes (Signed)
Patient ID: CHEVIS WEISENSEL, male   DOB: [birth year], 78 y.o.   MRN: [id number]   Patient Name: Louis Reid Date of Encounter: 05/19/2012    SUBJECTIVE  Louis Reid is doing very well this morning. He denies any chest pain, shortness of breath or hemoptysis. His appetite is returning. He is still very weak. He is working with physical therapy, speech therapy, and occupational therapy. See extensive note by Dr. Sw[place]il[place] yesterday for details and problem list.  CURRENT MEDS    . antiseptic oral rinse  1 application Mouth Rinse QID  . aspirin  81 mg Oral Daily  . atorvastatin  80 mg Per Tube q1800  . carvedilol  3.125 mg Oral BID WC  . chlorhexidine  15 mL Mouth/Throat BID  . feeding supplement  237 mL Oral BID BM  . furosemide  20 mg Oral Daily  . pantoprazole  40 mg Oral Q1200  . Ticagrelor  90 mg Oral BID  . [COMPLETED] warfarin  2 mg Oral ONCE-1800  . warfarin   Does not apply Once  . Warfarin - Pharmacist Dosing Inpatient   Does not apply q1800  . [DISCONTINUED] furosemide  40 mg Oral Daily    OBJECTIVE  Filed Vitals:   05/18/12 1347 05/18/12 1956 05/19/12 0424 05/19/12 0624  BP: 100/58 95/61 108/69   Pulse: 57 81 80   Temp: 98.3 F (36.8 C) 98.7 F (37.1 C) 97.4 F (36.3 C)   TempSrc: Oral Oral Oral   Resp: 18 20 20    Height:      Weight:    119 lb 11.2 oz (54.296 kg)  SpO2: 100% 96% 96%     Intake/Output Summary (Last 24 hours) at 05/19/12 0826 Last data filed at 05/19/12 0425  Gross per 24 hour  Intake    200 ml  Output   1300 ml  Net  -1100 ml   Filed Weights   05/17/12 0725 05/18/12 0649 05/19/12 0624  Weight: 115 lb 11.9 oz (52.5 kg) 113 lb 12.1 oz (51.6 kg) 119 lb 11.2 oz (54.296 kg)    PHYSICAL EXAM  General: Pleasant, NAD. Frail, Neuro: Alert and oriented X 3. Moves all extremities spontaneously. Psych: Normal affect. HEENT:  Normal  Neck: Supple without bruits or JVD. Lungs:  Resp regular and unlabored, CTA. Heart: RRR no s3, s4, or  murmurs. Abdomen: Soft, non-tender, non-distended, BS + x 4.  Extremities: No clubbing, cyanosis or edema. DP/PT/Radials 2+ and equal bilaterally.  Accessory Clinical Findings  CBC  Basename 05/18/12 0410 05/17/12 0520  WBC 7.3 6.2  NEUTROABS -- --  HGB 9.0* 9.5*  HCT 27.1* 28.2*  MCV 93.4 93.7  PLT 211 219   Basic Metabolic Panel  Basename 05/18/12 0410 05/17/12 0520  NA 143 141  K 3.7 3.3*  CL 107 105  CO2 28 28  GLUCOSE 106* 99  BUN 20 22  CREATININE 1.05 1.09  CALCIUM 8.4 8.6  MG -- --  PHOS -- --   Liver Function Tests No results found for this basename: AST:2,ALT:2,ALKPHOS:2,BILITOT:2,PROT:2,ALBUMIN:2 in the last 72 hours No results found for this basename: LIPASE:2,AMYLASE:2 in the last 72 hours Cardiac Enzymes No results found for this basename: CKTOTAL:3,CKMB:3,CKMBINDEX:3,TROPONINI:3 in the last 72 hours BNP No components found with this basename: POCBNP:3 D-Dimer No results found for this basename: DDIMER:2 in the last 72 hours Hemoglobin A1C  Basename 05/18/12 0410  HGBA1C 5.9*   Fasting Lipid Panel No results found for this basename: CHOL,HDL,LDLCALC,TRIG,CHOLHDL,LDLDIRECT in the last  72 hours Thyroid Function Tests No results found for this basename: TSH,T4TOTAL,FREET3,T3FREE,THYROIDAB in the last 72 hours  TELE  Sinus rhythm  ECG    Radiology/Studies  Dg Abd 1 View  04/25/2012  *RADIOLOGY REPORT*  Clinical Data: Small bowel obstruction.  ABDOMEN - 1 VIEW  Comparison: CT of the abdomen and pelvis on 04/24/2012  Findings: Nasogastric tube extends into the stomach.  There remains dilatation of small bowel.  There is some air and stool present in the colon.  Findings remain likely consistent with a partial small bowel obstruction.  IMPRESSION: Findings consistent with partial small bowel obstruction.   Original Report Authenticated By: Irish Lack, M.D.    Ct Head Wo Contrast  05/12/2012  *RADIOLOGY REPORT*  Clinical Data: Altered mental  status.  Cardiac arrest.  CT HEAD WITHOUT CONTRAST  Technique:  Contiguous axial images were obtained from the base of the skull through the vertex without contrast.  Comparison: None.  Findings: There is no acute intracranial hemorrhage, infarction, or mass lesion.  There is mild diffuse cerebral cortical and cerebellar atrophy.  No ventricular dilatation.  There is no brain edema or appreciable small vessel disease.  Osseous structures are normal.  IMPRESSION: Slight atrophy.  Otherwise, normal exam.  No brain edema.   Original Report Authenticated By: Francene Boyers, M.D.    Ct Angio Chest Pe W/cm &/or Wo Cm  05/12/2012  *RADIOLOGY REPORT*  Clinical Data: Pulmonary infiltrates.  Cardiac arrest.  CT ANGIOGRAPHY CHEST  Technique:  Multidetector CT imaging of the chest using the standard protocol during bolus administration of intravenous contrast. Multiplanar reconstructed images including MIPs were obtained and reviewed to evaluate the vascular anatomy.  Contrast: OMNIPAQUE IOHEXOL 300 MG/ML  SOLN  Comparison: Chest x-ray dated 05/12/2012  Findings: There is a single small pulmonary embolus and artery to the lingula of the left lung.  No other pulmonary emboli.  There are moderate bilateral pleural effusions with secondary atelectasis in the lower lobes.  The patient has a marked pectus excavatum deformity.  Extensive coronary artery calcification. Previous CABG.  No hilar or mediastinal adenopathy.   No acute osseous abnormality.  Ascites is noted in the upper abdomen.  IMPRESSION:  1.  Tiny pulmonary embolus in the lingula.  No other emboli. 2.  Moderate bilateral pleural effusions.   Original Report Authenticated By: Francene Boyers, M.D.    Ct Abdomen Pelvis W Contrast  05/12/2012  *RADIOLOGY REPORT*  Clinical Data: Abscess.  Small bowel obstruction.  CT ABDOMEN AND PELVIS WITH CONTRAST  Technique:  Multidetector CT imaging of the abdomen and pelvis was performed following the standard protocol during  bolus administration of intravenous contrast.  Contrast: OMNIPAQUE IOHEXOL 300 MG/ML  SOLN  Comparison: 04/24/2012  Findings: Severe pectus excavatum again noted. The abdominal parenchymal organs are unremarkable in appearance.  No evidence of hydronephrosis.  No soft tissue masses or lymphadenopathy identified.  The gallbladder is now distended and contains high attenuation sludge or vicariously excreted contrast material.  No definite evidence of gallbladder Kirubel Aja thickening.  Small bowel dilatation has resolved since previous study.  Mild ascites is seen which is new, and there is also increase in diffuse body Citlaly Camplin edema and bilateral pleural effusions.  No focal inflammatory process or abscess identified.  Foley catheter is seen within the bladder.  Mildly enlarged prostate gland is stable.  A small amount of fluid noted in the left inguinal canal.  IMPRESSION:  1.  New mild ascites and diffuse body Aleane Wesenberg edema.  Bilateral pleural effusions also noted. 2.  Distended gallbladder, containing sludge versus vicariously excreted contrast. 3.  Resolution of small bowel dilatation since prior exam.  No evidence of abscess.   Original Report Authenticated By: Myles Rosenthal, M.D.    Ct Abdomen Pelvis W Contrast  04/24/2012  *RADIOLOGY REPORT*  Clinical Data: Abdominal pain.  CT ABDOMEN AND PELVIS WITH CONTRAST  Technique:  Multidetector CT imaging of the abdomen and pelvis was performed following the standard protocol during bolus administration of intravenous contrast.  Contrast: OMNIPAQUE IOHEXOL 300 MG/ML  SOLN  Comparison: None.  Findings: Lung Bases: Severe pectus excavatum.  Median sternotomy, CABG.  Liver:  No focal mass lesions.  Probable mild intrahepatic biliary ductal dilation.  Periportal edema less likely.  Correlation with bilirubin recommended.  Spleen:  Normal.  Small accessory spleen dorsal to the spleen proper.  The  Gallbladder:  Normal appearance of the gallbladder.  Small ascites over the  liver margin.  Common bile duct:  Within normal limits.  Pancreas:  Normal.  Adrenal glands:  Normal.  Kidneys:  Normal enhancement.  Bilateral renal atrophy.  Low density renal cortical lesions compatible with small renal cysts. Extrarenal pelvis bilaterally.  Ureters grossly appear within normal limits.  Stomach:  Distended with oral contrast.  Small bowel:  Duodenum appears normal. Dilation of the jejunum extending in the anatomic pelvis.  There is decompressed distal small bowel compatible with high-grade if not complete small bowel obstruction.  No pneumatosis, perforation or free air.  Multiple angulated loops of small bowel suggest adhesive obstruction.  No mass lesion is identified.  Colon:   No colonic inflammatory changes.  Portions of the colon are decompressed.  No colonic obstruction.  Normal appendix in the right lower quadrant.  Pelvic Genitourinary:  Small amount of ascites.  Prostatic megaly. Ascites extends into a left inguinal hernia.  Bones:  No aggressive osseous lesions.  Degenerative disc disease most pronounced at L2-L3 and L5-S1.  Vasculature: Atherosclerosis.  No aneurysm.  IMPRESSION: 1.  Small bowel obstruction appears high-grade.  Decompressed terminal ileum is present.  Multiple angulated loops of small bowel suggest adhesive small bowel obstruction. 2.  Small volume of ascites. 3.  Renal cortical atrophy.  Bilateral prominent extrarenal pelvis. 4.  Severe pectus excavatum.  CABG. 5.  Mild intrahepatic biliary ductal dilation.  Correlation with bilirubin is recommended.  Common bile duct appears within normal limits and there are no calcified gallstones.   Original Report Authenticated By: Andreas Newport, M.D.    Dg Chest Port 1 View  05/15/2012  *RADIOLOGY REPORT*  Clinical Data: Assessed pulmonary edema.  Intubated patient.  PORTABLE CHEST - 1 VIEW  Comparison: 05/14/2012  Findings: Bilateral pleural effusions with associated atelectasis is stable.  The lungs are hyperexpanded.   There is no pneumothorax.  Changes from CABG surgery are stable.  The endotracheal tube, nasogastric tube and left subclavian central venous line remain well positioned and unchanged.  IMPRESSION: No convincing active pulmonary edema.  Bilateral pleural effusions with lung base opacity, most likely atelectasis.   Original Report Authenticated By: Amie Portland, M.D.    Dg Chest Port 1 View  05/14/2012  *RADIOLOGY REPORT*  Clinical Data: Edema.  PORTABLE CHEST - 1 VIEW  Comparison: 05/13/2012  Findings: Support devices are unchanged. Prior CABG.  Stable cardiomegaly.  Mild bilateral airspace disease, left greater than right, with small bilateral effusions, also left greater than right.  No real change since prior study.  IMPRESSION: No significant change asymmetric airspace  disease and effusions, left greater than right, likely asymmetric edema.   Original Report Authenticated By: Charlett Nose, M.D.    Dg Chest Port 1 View  05/13/2012  *RADIOLOGY REPORT*  Clinical Data: Assess endotracheal.  PORTABLE CHEST - 1 VIEW  Comparison: Portable chest x-ray of 05/12/2012 and CT angio chest of the same date  Findings: The tip of the endotracheal tube is approximately 5.0 cm above the carina.  Basilar opacities remain left greater than right consistent with atelectasis, effusion, possibly left basilar pneumonia.  A left central venous line is unchanged in position. Heart size is stable.  IMPRESSION:  1.  Tip of endotracheal tube 5.0 cm above the carina. 2.  Little change in basilar opacities left greater than right.   Original Report Authenticated By: Dwyane Dee, M.D.    Dg Chest Port 1 View  05/12/2012  *RADIOLOGY REPORT*  Clinical Data: Evaluate endotracheal tube  PORTABLE CHEST - 1 VIEW  Comparison: 05/11/2012; 05/10/2012  Findings:  Grossly unchanged cardiac silhouette and mediastinal contours post median sternotomy and CABG.  Stable positioning of support apparatus including external pacing devices.  No definite  pneumothorax, though the right lung apex is excluded from view. The lungs again appear hyperinflated.  Pulmonary vascular is indistinct.  Unchanged small bilateral layering effusions and bibasilar heterogeneous opacities.  No new focal airspace opacities.  Unchanged bones.  IMPRESSION: 1.  Stable positioning of support apparatus.  No pneumothorax. 2.  Grossly unchanged mild pulmonary edema, small bilateral effusions and bibasilar opacities, favored to represent atelectasis.   Original Report Authenticated By: Tacey Ruiz, MD    Dg Chest Port 1 View  05/11/2012  *RADIOLOGY REPORT*  Clinical Data: Ventilator.  PORTABLE CHEST - 1 VIEW  Comparison: 05/10/2012.  Findings: Endotracheal tube is in satisfactory position. Nasogastric tube is followed into the stomach.  Left subclavian central line tip projects over the SVC.  Defibrillator pads are in place.  A wired line tip projects over the lower mediastinum. Question if this is external to the patient.  Bibasilar air space disease, with slight worsening on the right.  No pleural fluid.  IMPRESSION: Bibasilar air space disease with slight interval worsening on the right.   Original Report Authenticated By: Leanna Battles, M.D.    Dg Chest Port 1 View  05/10/2012  *RADIOLOGY REPORT*  Clinical Data: Acute respiratory failure.  On ventilator.  Central line placement.  PORTABLE CHEST - 1 VIEW  Comparison: Prior today  Findings: A new left subclavian center venous catheter is seen with tip in the distal SVC.  Endotracheal tube and nasogastric tube remain in appropriate position.  No pneumothorax identified.  Decreased lung volumes are seen with increased bibasilar atelectasis.  Heart size is stable.  Prior CABG again noted.  IMPRESSION:  1.  New left subclavian center venous catheter in appropriate position.  No evidence of pneumothorax. 2.  Increased bibasilar atelectasis.   Original Report Authenticated By: Myles Rosenthal, M.D.    Dg Chest Port 1 View  05/10/2012   *RADIOLOGY REPORT*  Clinical Data: Intubation  PORTABLE CHEST - 1 VIEW  Comparison: None.  Findings: Endotracheal tube in good position.  There is a gastric tube in the proximal esophagus or trachea overlying T1. Repositioning is suggested.  COPD.  Prior CABG.  Lungs are clear without infiltrate or effusion. Left apical scarring  IMPRESSION:  Endotracheal tube in good position.  Gastric tube overlies the trachea or proximal esophagus at the T1 level.  Repositioning is suggested.   Original  Report Authenticated By: Janeece Riggers, M.D.    Dg Abd 2 Views  04/26/2012  *RADIOLOGY REPORT*  Clinical Data: Follow up small bowel obstruction.  ABDOMEN - 2 VIEW  Comparison: 04/25/2012 and 04/24/2012  Findings: Upright and supine views of the abdomen were obtained. Again noted are dilated gas-filled loops of small bowel with air- fluid levels.  Nasogastric tube is coiled the stomach.  The degree of small bowel distention has not changed.  Again noted is stool in the right colon and pelvic region.  There is some lucency underneath the right hemidiaphragm but this may be associated with ascites based on the previous CT findings. Left basilar densities could represent atelectasis and cannot exclude a small pleural effusion.  IMPRESSION: Persistent dilatation of small bowel loops with air-fluid levels. There has been minimal change since the previous examination. Findings are compatible with a small bowel obstruction.  Lucency underneath the right hemidiaphragm most likely represents ascites.  Difficult to evaluate for free air on this examination. Further evaluation for free air could be performed with a left lateral decubitus study.   Original Report Authenticated By: Richarda Overlie, M.D.     ASSESSMENT AND PLAN  Principal Problem:  *Ventricular fibrillation arrest Active Problems:  HYPERLIPIDEMIA  HYPERTENSION  Coronary artery disease  History of coronary artery bypass surgery  Acute respiratory failure with hypoxia 2/2  cardiac arrest  Acute encephalopathy 2/2 cardiac arrest  Hyperglycemia  Cardiogenic shock  Acute systolic congestive heart failure, NYHA class 3/EF 20-25%  Anemia of critical illness  PE (pulmonary embolism)  Physical deconditioning  Anorexia  STEMI (ST elevation myocardial infarction)    See extensive problem list and plan per Dr. [place] yesterday. He is slowly improving. His Coumadin has been therapeutic for 24 hours. Discontinue IV heparin. We'll add spironolactone 25 mg a day. Increase activity. I'll make sure life vest is placed before discharge. I reviewed in detail what happened to him. He will need very close followup as an outpatient.  Signed, Valera Castle MD

## 2012-05-20 ENCOUNTER — Inpatient Hospital Stay (HOSPITAL_COMMUNITY)
Admission: RE | Admit: 2012-05-20 | Discharge: 2012-05-28 | DRG: 945 | Disposition: A | Discharge status: Home Health | Payer: Medicare Other | Source: Ambulatory Visit | Attending: Physical Medicine & Rehabilitation | Admitting: Physical Medicine & Rehabilitation

## 2012-05-20 ENCOUNTER — Encounter (HOSPITAL_COMMUNITY): Payer: Self-pay | Admitting: *Deleted

## 2012-05-20 DIAGNOSIS — G931 Anoxic brain damage, not elsewhere classified: Secondary | ICD-10-CM

## 2012-05-20 DIAGNOSIS — Z951 Presence of aortocoronary bypass graft: Secondary | ICD-10-CM | POA: Diagnosis not present

## 2012-05-20 DIAGNOSIS — I469 Cardiac arrest, cause unspecified: Secondary | ICD-10-CM

## 2012-05-20 DIAGNOSIS — Z7902 Long term (current) use of antithrombotics/antiplatelets: Secondary | ICD-10-CM | POA: Diagnosis not present

## 2012-05-20 DIAGNOSIS — F3289 Other specified depressive episodes: Secondary | ICD-10-CM

## 2012-05-20 DIAGNOSIS — G4700 Insomnia, unspecified: Secondary | ICD-10-CM | POA: Diagnosis not present

## 2012-05-20 DIAGNOSIS — Z79899 Other long term (current) drug therapy: Secondary | ICD-10-CM | POA: Diagnosis not present

## 2012-05-20 DIAGNOSIS — I2699 Other pulmonary embolism without acute cor pulmonale: Secondary | ICD-10-CM | POA: Diagnosis present

## 2012-05-20 DIAGNOSIS — J811 Chronic pulmonary edema: Secondary | ICD-10-CM

## 2012-05-20 DIAGNOSIS — Z7982 Long term (current) use of aspirin: Secondary | ICD-10-CM | POA: Diagnosis not present

## 2012-05-20 DIAGNOSIS — I10 Essential (primary) hypertension: Secondary | ICD-10-CM | POA: Diagnosis present

## 2012-05-20 DIAGNOSIS — Z8249 Family history of ischemic heart disease and other diseases of the circulatory system: Secondary | ICD-10-CM | POA: Diagnosis not present

## 2012-05-20 DIAGNOSIS — Z9861 Coronary angioplasty status: Secondary | ICD-10-CM | POA: Diagnosis not present

## 2012-05-20 DIAGNOSIS — F329 Major depressive disorder, single episode, unspecified: Secondary | ICD-10-CM

## 2012-05-20 DIAGNOSIS — R5381 Other malaise: Secondary | ICD-10-CM | POA: Diagnosis not present

## 2012-05-20 DIAGNOSIS — I252 Old myocardial infarction: Secondary | ICD-10-CM

## 2012-05-20 DIAGNOSIS — E785 Hyperlipidemia, unspecified: Secondary | ICD-10-CM

## 2012-05-20 DIAGNOSIS — I2510 Atherosclerotic heart disease of native coronary artery without angina pectoris: Secondary | ICD-10-CM

## 2012-05-20 DIAGNOSIS — Z7901 Long term (current) use of anticoagulants: Secondary | ICD-10-CM | POA: Diagnosis not present

## 2012-05-20 DIAGNOSIS — Z5189 Encounter for other specified aftercare: Secondary | ICD-10-CM | POA: Diagnosis present

## 2012-05-20 DIAGNOSIS — Q676 Pectus excavatum: Secondary | ICD-10-CM | POA: Diagnosis not present

## 2012-05-20 DIAGNOSIS — I214 Non-ST elevation (NSTEMI) myocardial infarction: Secondary | ICD-10-CM

## 2012-05-20 DIAGNOSIS — E876 Hypokalemia: Secondary | ICD-10-CM

## 2012-05-20 DIAGNOSIS — I4901 Ventricular fibrillation: Secondary | ICD-10-CM | POA: Diagnosis present

## 2012-05-20 DIAGNOSIS — M810 Age-related osteoporosis without current pathological fracture: Secondary | ICD-10-CM | POA: Diagnosis not present

## 2012-05-20 DIAGNOSIS — R570 Cardiogenic shock: Secondary | ICD-10-CM

## 2012-05-20 DIAGNOSIS — I213 ST elevation (STEMI) myocardial infarction of unspecified site: Secondary | ICD-10-CM | POA: Diagnosis present

## 2012-05-20 DIAGNOSIS — Z833 Family history of diabetes mellitus: Secondary | ICD-10-CM

## 2012-05-20 LAB — BASIC METABOLIC PANEL
BUN: 18 mg/dL (ref 6–23)
Calcium: 8.3 mg/dL — ABNORMAL LOW (ref 8.4–10.5)
GFR calc Af Amer: >90 mL/min (ref >90)
GFR calc non Af Amer: 79 mL/min — ABNORMAL LOW (ref >90)
Potassium: 3.8 mEq/L (ref 3.5–5.1)

## 2012-05-20 LAB — PROTIME-INR: Prothrombin Time: 25.9 seconds — ABNORMAL HIGH (ref 11.6–15.2)

## 2012-05-20 LAB — PROTHROMBIN GENE MUTATION

## 2012-05-20 MED ORDER — CHLORHEXIDINE GLUCONATE 0.12 % MT SOLN
15.0000 mL | Freq: Two times a day (BID) | OROMUCOSAL | Status: DC
  Administered 2012-05-20 – 2012-05-28 (×12): 15 mL via OROMUCOSAL
  Filled 2012-05-20 (×18): qty 15

## 2012-05-20 MED ORDER — PROCHLORPERAZINE EDISYLATE 5 MG/ML IJ SOLN
5.0000 mg | Freq: Four times a day (QID) | INTRAMUSCULAR | Status: DC | PRN
  Filled 2012-05-20: qty 2

## 2012-05-20 MED ORDER — TRAZODONE HCL 50 MG PO TABS
25.0000 mg | ORAL_TABLET | Freq: Every evening | ORAL | Status: DC | PRN
  Administered 2012-05-20: 50 mg via ORAL
  Filled 2012-05-20: qty 1

## 2012-05-20 MED ORDER — DIPHENHYDRAMINE HCL 12.5 MG/5ML PO ELIX
12.5000 mg | ORAL_SOLUTION | Freq: Four times a day (QID) | ORAL | Status: DC | PRN
  Filled 2012-05-20: qty 10

## 2012-05-20 MED ORDER — GUAIFENESIN-DM 100-10 MG/5ML PO SYRP: 5.0000 mL | ORAL_SOLUTION | Freq: Four times a day (QID) | ORAL | Status: DC | PRN

## 2012-05-20 MED ORDER — SPIRONOLACTONE 12.5 MG HALF TABLET: 12.5000 mg | ORAL_TABLET | Freq: Every day | ORAL | Status: DC

## 2012-05-20 MED ORDER — LISINOPRIL 2.5 MG PO TABS: 2.5000 mg | ORAL_TABLET | Freq: Every day | ORAL | Status: DC

## 2012-05-20 MED ORDER — CARVEDILOL 3.125 MG PO TABS
3.1250 mg | ORAL_TABLET | Freq: Two times a day (BID) | ORAL | Status: DC
Start: 2012-05-20 — End: 2012-05-28
  Administered 2012-05-20 – 2012-05-28 (×16): 3.125 mg via ORAL
  Filled 2012-05-20 (×18): qty 1

## 2012-05-20 MED ORDER — ENSURE COMPLETE PO LIQD: 237.0000 mL | Freq: Two times a day (BID) | ORAL | Status: DC

## 2012-05-20 MED ORDER — PANTOPRAZOLE SODIUM 40 MG PO TBEC
40.0000 mg | DELAYED_RELEASE_TABLET | Freq: Every day | ORAL | Status: DC
  Administered 2012-05-21 – 2012-05-28 (×8): 40 mg via ORAL
  Filled 2012-05-20 (×9): qty 1

## 2012-05-20 MED ORDER — WARFARIN - PHARMACIST DOSING INPATIENT: Freq: Every day | Status: DC

## 2012-05-20 MED ORDER — ALUM & MAG HYDROXIDE-SIMETH 200-200-20 MG/5ML PO SUSP: 30.0000 mL | ORAL | Status: DC | PRN

## 2012-05-20 MED ORDER — WARFARIN SODIUM 2.5 MG PO TABS
2.5000 mg | ORAL_TABLET | Freq: Once | ORAL | Status: AC
  Administered 2012-05-20: 2.5 mg via ORAL
  Filled 2012-05-20: qty 1

## 2012-05-20 MED ORDER — ATORVASTATIN CALCIUM 80 MG PO TABS: 80.0000 mg | ORAL_TABLET | Freq: Every day | ORAL | Status: DC

## 2012-05-20 MED ORDER — ENSURE COMPLETE PO LIQD
237.0000 mL | Freq: Two times a day (BID) | ORAL | Status: DC
  Administered 2012-05-21 – 2012-05-23 (×5): 237 mL via ORAL

## 2012-05-20 MED ORDER — PROCHLORPERAZINE 25 MG RE SUPP
12.5000 mg | Freq: Four times a day (QID) | RECTAL | Status: DC | PRN
  Filled 2012-05-20: qty 1

## 2012-05-20 MED ORDER — PROCHLORPERAZINE MALEATE 5 MG PO TABS
5.0000 mg | ORAL_TABLET | Freq: Four times a day (QID) | ORAL | Status: DC | PRN
  Filled 2012-05-20: qty 2

## 2012-05-20 MED ORDER — ATORVASTATIN CALCIUM 80 MG PO TABS
80.0000 mg | ORAL_TABLET | Freq: Every day | ORAL | Status: DC
  Administered 2012-05-20 – 2012-05-27 (×8): 80 mg
  Filled 2012-05-20 (×9): qty 1

## 2012-05-20 MED ORDER — WARFARIN SODIUM 2.5 MG PO TABS
2.5000 mg | ORAL_TABLET | Freq: Once | ORAL | Status: DC
  Filled 2012-05-20: qty 1

## 2012-05-20 MED ORDER — FUROSEMIDE 20 MG PO TABS
20.0000 mg | ORAL_TABLET | Freq: Every day | ORAL | Status: DC
  Administered 2012-05-21 – 2012-05-28 (×8): 20 mg via ORAL
  Filled 2012-05-20 (×9): qty 1

## 2012-05-20 MED ORDER — POLYETHYLENE GLYCOL 3350 17 G PO PACK
17.0000 g | PACK | Freq: Every day | ORAL | Status: DC
  Administered 2012-05-22 – 2012-05-28 (×7): 17 g via ORAL
  Filled 2012-05-20 (×10): qty 1

## 2012-05-20 MED ORDER — BIOTENE DRY MOUTH MT LIQD
1.0000 "application " | Freq: Four times a day (QID) | OROMUCOSAL | Status: DC
  Administered 2012-05-21 – 2012-05-27 (×10): 15 mL via OROMUCOSAL

## 2012-05-20 MED ORDER — ACETAMINOPHEN 325 MG PO TABS
325.0000 mg | ORAL_TABLET | ORAL | Status: DC | PRN
  Administered 2012-05-21: 650 mg via ORAL
  Filled 2012-05-20: qty 2

## 2012-05-20 MED ORDER — SPIRONOLACTONE 12.5 MG HALF TABLET
12.5000 mg | ORAL_TABLET | Freq: Every day | ORAL | Status: DC
  Administered 2012-05-21 – 2012-05-28 (×8): 12.5 mg via ORAL
  Filled 2012-05-20 (×11): qty 1

## 2012-05-20 MED ORDER — TICAGRELOR 90 MG PO TABS: 90.0000 mg | ORAL_TABLET | Freq: Two times a day (BID) | ORAL | Status: DC

## 2012-05-20 MED ORDER — TICAGRELOR 90 MG PO TABS
90.0000 mg | ORAL_TABLET | Freq: Two times a day (BID) | ORAL | Status: DC
  Administered 2012-05-20 – 2012-05-28 (×16): 90 mg via ORAL
  Filled 2012-05-20 (×18): qty 1

## 2012-05-20 MED ORDER — WARFARIN SODIUM 2.5 MG PO TABS: 2.5000 mg | ORAL_TABLET | Freq: Once | ORAL | Status: DC

## 2012-05-20 MED ORDER — ZOLPIDEM TARTRATE 5 MG PO TABS
5.0000 mg | ORAL_TABLET | Freq: Every evening | ORAL | Status: DC | PRN
  Administered 2012-05-21 – 2012-05-27 (×7): 5 mg via ORAL
  Filled 2012-05-20 (×8): qty 1

## 2012-05-20 MED ORDER — ASPIRIN 81 MG PO CHEW
81.0000 mg | CHEWABLE_TABLET | Freq: Every day | ORAL | Status: DC
  Administered 2012-05-21 – 2012-05-28 (×8): 81 mg via ORAL
  Filled 2012-05-20 (×9): qty 1

## 2012-05-20 MED ORDER — ACETAMINOPHEN 325 MG PO TABS: 650.0000 mg | ORAL_TABLET | ORAL | Status: DC | PRN

## 2012-05-20 MED ORDER — CARVEDILOL 3.125 MG PO TABS: 3.1250 mg | ORAL_TABLET | Freq: Two times a day (BID) | ORAL | Status: DC

## 2012-05-20 MED ORDER — LISINOPRIL 2.5 MG PO TABS
2.5000 mg | ORAL_TABLET | Freq: Every day | ORAL | Status: DC
Start: 2012-05-21 — End: 2012-05-28
  Administered 2012-05-21 – 2012-05-28 (×8): 2.5 mg via ORAL
  Filled 2012-05-20 (×9): qty 1

## 2012-05-20 MED ORDER — FLEET ENEMA 7-19 GM/118ML RE ENEM: 1.0000 | ENEMA | Freq: Once | RECTAL | Status: AC | PRN

## 2012-05-20 MED ORDER — FUROSEMIDE 20 MG PO TABS: 20.0000 mg | ORAL_TABLET | Freq: Every day | ORAL | Status: DC

## 2012-05-20 NOTE — Progress Notes (Signed)
Pt  Discharged to Rehab unit with belongings and accompanied by his son. Pt placed on Life vest prior to dc from 2000.

## 2012-05-20 NOTE — Progress Notes (Signed)
ANTICOAGULATION CONSULT NOTE - Follow Up Consult  Pharmacy Consult for Heparin and Coumadin Indication: pulmonary embolus  Allergies  Allergen Reactions  . Cold Medicine Plus (Chlorphen-Pseudoephed-Apap)     COLD MEDICATIONS MAKE HIM "LOOPY"    Patient Measurements: Height: 5\' 9"  (175.3 cm) Weight: 119 lb 11.2 oz (54.296 kg) IBW/kg (Calculated) : 70.7  Heparin Dosing Weight: 51.6 kg  Vital Signs: Temp: 98.8 F (37.1 C) (12/13 0509) Temp src: Oral (12/13 0509) BP: 102/60 mmHg (12/13 0509) Pulse Rate: 98  (12/13 0509)  Labs:  Basename 05/20/12 0605 05/19/12 0500 05/18/12 0410  HGB -- -- 9.0*  HCT -- -- 27.1*  PLT -- -- 211  APTT -- -- --  LABPROT 25.9* 27.1* 22.0*  INR 2.51* 2.67* 2.01*  HEPARINUNFRC -- 0.43 0.34  CREATININE 0.93 -- 1.05  CKTOTAL -- -- --  CKMB -- -- --  TROPONINI -- -- --    Estimated Creatinine Clearance: 51.1 ml/min (by C-G formula based on Cr of 0.93).  Assessment: Louis Reid is a 78 year old male s/p heparin to coumadin bridge for PE. INR is therapeutic. No bleeding noted. Also on Aspirin 81 mg and Brilinta 90 mg BID. Noted short course of Brilinta planned; may stop Aspirin while on Coumadin, if Hgb trends down. Noted plans for inpatient rehab today.   Goal of Therapy:  INR 2-3 Heparin level 0.3-0.7 units/ml Monitor platelets by anticoagulation protocol: Yes   Plan:   Coumadin 2.5 mg po x 1 today.  F/u PT/INR in the AM    Thank you,  Brett Fairy, PharmD, BCPS 05/20/2012 8:53 AM

## 2012-05-20 NOTE — H&P (Signed)
Physical Medicine and Rehabilitation Admission H&P  Chief Complaint   Patient presents with   .    :  HPI: Louis Reid is a 78 y.o. male with history of CAD, recent admission for SBO with lysis of adhesions, who was admitted on 05/10/12 past witnessed cardiac arrest. Bystander CPR X 10 mins , AED shock X3. Was noted to be in atrial fibrillation and intubated in ED. He was taken to cath lab and underwent PTCA with thrombectomy of occluded LAD by Dr. Clifton James. He developed hematuria/bloody oral secretions with intermittent VT. Hypokalemia supplemented and integrelin discontinued. CT head done due to lethargy and showed no acute changes. Was started on IV antibiotics for leucocytosis. CT chest with tiny PE in lingula and CT abdomen with distended GB with mild ascites and diffuse body wall edema. Started on heparin drip for PE. BLE dopplers negative for DVT. Tolerated extubation 12/08 and encephalopathy slowly resolving. Diet initiated 12/9 due to improvement in mentation and no s/s of aspiration. LifeVest fitted last pm. Patient noted to have difficulty following commands with short term memory deficits as well as balance deficits with impaired mobility. MD, ST recommending CIR. Ultimately pt was admitted today.  Review of Systems  HENT: Negative for hearing loss and neck pain.  Eyes: Negative for blurred vision and double vision.  Respiratory: Negative for cough and shortness of breath.  Cardiovascular: Negative for chest pain and palpitations.  Gastrointestinal: Positive for constipation (no BM for 2-3 days). Negative for heartburn and nausea.  Musculoskeletal: Negative for myalgias and back pain.  Neurological: Negative for dizziness, tingling and headaches.  Psychiatric/Behavioral: The patient has insomnia.   Past Medical History   Diagnosis  Date   .  Acute myocardial infarction of anterolateral wall, episode of care unspecified    .  Coronary atherosclerosis of artery bypass graft    .   Sinoatrial node dysfunction    .  HTN (hypertension)    .  HLD (hyperlipidemia)    .  Depression    .  History of kidney stones    .  Congenital funnel chest    .  Inguinal hernia    .  Osteoporosis     Past Surgical History   Procedure  Date   .  Coronary artery bypass graft  1996   .  Inguinal hernia repair  1996     right, hx   .  Transurethral resection of prostate    .  Ptca      percutaneous transluminal coronary intervention and brachy therapy, Bruce R. Juanda Chance, MD. EF 60%   .  Laparotomy  04/27/2012     Procedure: EXPLORATORY LAPAROTOMY; Surgeon: Cherylynn Ridges, MD; Location: [facility] OR; Service: General; Laterality: N/A;   .  Esophagogastroduodenoscopy  05/08/2012     Procedure: ESOPHAGOGASTRODUODENOSCOPY (EGD); Surgeon: Charna Elizabeth, MD; Location: [facility] ENDOSCOPY; Service: Endoscopy; Laterality: N/A; Gunnar Fusi put on for Dr. Loreta Ave , Dr. Loreta Ave will call if she wants another time    Family History   Problem  Relation  Age of Onset   .  Colon cancer  Brother    .  Diabetes  Brother    .  Heart disease  Father    .  Heart disease  Brother    .  Other  Mother       brain tumor    Social History: Lives alone. Retired Production designer, theatre/television/film. Independent and active prior to abdominal surgery. Son can provided supervision for few days past discharge.  He reports that he has quit smoking. He has never used smokeless tobacco. He reports that he does not drink alcohol or use illicit drugs.  Allergies   Allergen  Reactions   .  Cold Medicine Plus (Chlorphen-Pseudoephed-Apap)      COLD MEDICATIONS MAKE HIM "LOOPY"    Scheduled Meds:  .  antiseptic oral rinse  1 application  Mouth Rinse  QID   .  aspirin  81 mg  Oral  Daily   .  atorvastatin  80 mg  Per Tube  q1800   .  carvedilol  3.125 mg  Oral  BID WC   .  chlorhexidine  15 mL  Mouth/Throat  BID   .  feeding supplement  237 mL  Oral  BID BM   .  furosemide  20 mg  Oral  Daily   .  lisinopril  2.5 mg  Oral  Daily   .  pantoprazole  40 mg  Oral  Q1200   .   spironolactone  12.5 mg  Oral  Daily   .  Ticagrelor  90 mg  Oral  BID   .  warfarin   Does not apply  Once   .  Warfarin - Pharmacist Dosing Inpatient   Does not apply  q1800    Medications Prior to Admission   Medication  Sig  Dispense  Refill   .  aspirin 81 MG tablet  Take 1 tablet (81 mg total) by mouth daily.     .  benazepril (LOTENSIN) 10 MG tablet  Take 1 tablet (10 mg total) by mouth daily.  90 tablet  3   .  docusate sodium (COLACE) 100 MG capsule  Take 100 mg by mouth daily as needed. constipation     .  Multiple Vitamin (MULTIVITAMIN WITH MINERALS) TABS  Take 1 tablet by mouth daily.     .  pantoprazole (PROTONIX) 40 MG tablet  Take 1 tablet (40 mg total) by mouth 2 (two) times daily before a meal.  60 tablet  3   .  sertraline (ZOLOFT) 50 MG tablet  Take 50 mg by mouth daily.     .  simvastatin (ZOCOR) 20 MG tablet  TAKE 1 TABLET BY MOUTH AT BEDTIME  90 tablet  3   .  sucralfate (CARAFATE) 1 GM/10ML suspension  Take 10 mLs (1 g total) by mouth 4 (four) times daily - with meals and at bedtime.  420 mL  3    Home:  Home Living  Lives With: Alone  Available Help at Discharge: (son just 1 to 2 weeks;vs need for SNF longer term)  Type of Home: House  Home Access: Stairs to enter  [employer] of Steps: 3-4  Entrance Stairs-Rails: None  Home Layout: One level  Bathroom Shower/Tub: Medical sales representative: Standard  Bathroom Accessibility: Yes  How Accessible: Accessible via walker  Home Adaptive Equipment: None  Functional History:  Prior Function  Able to Take Stairs?: Yes  Driving: Yes  Vocation: Retired  Comments: states he was living in [employer]  Functional Status:  Mobility:  Bed Mobility  Bed Mobility: Supine to Sit;Sitting - Scoot to Edge of Bed  Rolling Left: 6: Modified independent (Device/Increase time);With rail  Left Sidelying to Sit: 5: Supervision;HOB elevated  Sitting - Scoot to Edge of Bed: 6: Modified independent  (Device/Increase time)  Transfers  Transfers: Sit to Stand;Stand to Sit  Sit to Stand: 3: Mod assist;With upper extremity assist;From  bed  Stand to Sit: 3: Mod assist;With upper extremity assist;To chair/3-in-1;To toilet;To bed  Ambulation/Gait  Ambulation/Gait Assistance: 3: Mod assist  Ambulation Distance (Feet): 40 Feet  Assistive device: Rolling walker  Ambulation/Gait Assistance Details: Cues for correct DME with improved recall for several steps, until posture deteriorates and he forgets all cues, pt continues to ask for assurance he is moving as he is told  Gait Pattern: Step-to pattern;Decreased stride length;Trunk flexed  Gait velocity: decr  General Gait Details: trunk flexion incr over time, req cues to positionally readjust  Stairs: No  Wheelchair Mobility  Wheelchair Mobility: No  ADL:  ADL  Grooming: Performed;Min guard;Set up;Brushing hair  Where Assessed - Grooming: Supported standing  Upper Body Bathing: Simulated;Minimal assistance  Where Assessed - Upper Body Bathing: Unsupported sitting  Lower Body Bathing: Simulated;Maximal assistance  Where Assessed - Lower Body Bathing: Supported sit to stand  Upper Body Dressing: Simulated;Minimal assistance  Where Assessed - Upper Body Dressing: Unsupported sitting  Lower Body Dressing: Simulated;Maximal assistance  Where Assessed - Lower Body Dressing: Supported sit to Scientist, research (life sciences): Performed;Minimal assistance  Toilet Transfer Method: Sit to Production manager: Raised toilet seat with arms (or 3-in-1 over toilet)  Equipment Used: Rolling walker;Gait belt  Transfers/Ambulation Related to ADLs: Pt ambulated to the bathroom with min A and constant VCs for posture, safety and to stay within confines of RW.  ADL Comments: Pt fatigues quickly. Tolerated unsupported standing at sink for 1-2 min to wash hands after using bathroom but had to sit and comb hair.  Cognition:  Cognition  Overall Cognitive  Status: Impaired  Arousal/Alertness: Awake/alert  Orientation Level: Oriented to person;Oriented to place  Attention: Sustained  Memory: Impaired  Memory Impairment: Storage deficit;Retrieval deficit;Decreased recall of new information;Prospective memory;Decreased short term memory  Decreased Short Term Memory: Functional basic;Verbal basic  Awareness: Appears intact  Problem Solving: Impaired  Problem Solving Impairment: Verbal basic;Functional basic  Executive Function: Self Monitoring;Self Correcting  Self Monitoring: Impaired  Self Monitoring Impairment: Verbal basic;Functional basic  Self Correcting: Impaired  Self Correcting Impairment: Verbal basic;Functional basic  Safety/Judgment: Appears intact  Cognition  Overall Cognitive Status: Impaired  Area of Impairment: Attention;Memory;Following commands;Awareness of deficits;Awareness of errors;Problem solving  Arousal/Alertness: Awake/alert  Orientation Level: Oriented X4 / Intact  Behavior During Session: [facility] for tasks performed  Current Attention Level: Focused  Attention - Other Comments: Continues to req constant step-by-step cueing for task, easily and quickly forgets what the plan was  Memory: (decr recall of DME instructions from prior day)  Following Commands: Follows one step commands consistently;Follows multi-step commands inconsistently  Awareness of Errors: Assistance required to identify errors made;Assistance required to correct errors made  Problem Solving: req cues to achieve self care (donning socks)  Blood pressure 107/69, pulse 100, temperature 97.4 F (36.3 C), temperature source Oral, resp. rate 20, height 5\' 9"  (1.753 m), weight 54.296 kg (119 lb 11.2 oz), SpO2 96.00%.  Physical Exam  Nursing note and vitals reviewed.  Constitutional: He is oriented to person, place, and time.  Eyes: Pupils are equal, round, and reactive to light.  Mouth: mucosa pink and moist Neck: Normal range of motion. Neck supple.   Cardiovascular: Normal rate and regular rhythm. No M,R,G Pulmonary/Chest: Effort normal and breath sounds normal.  Abdominal: Soft. Bowel sounds are normal. He exhibits no distension. There is no tenderness.  Musculoskeletal: He exhibits no edema and no tenderness.  Neurological: He is alert and oriented to person, place, and time  with cueing. A little impulsive. Sometimes tangential. Displayed some short term memory deficits. Strength 4/5 UE and 3+ to 4/5 in the LE's. No sensory findings.  Skin: Skin is warm and dry.  Diffuse ecchymosis from right deltoid to elbow.   Results for orders placed during the hospital encounter of 05/10/12 (from the past 48 hour(s))   HEPARIN LEVEL (UNFRACTIONATED) Status: Normal    Collection Time    05/18/12 4:10 AM   Component  Value  Range  Comment    Heparin Unfractionated  0.34  0.30 - 0.70 IU/mL    PROTIME-INR Status: Abnormal    Collection Time    05/18/12 4:10 AM   Component  Value  Range  Comment    Prothrombin Time  22.0 (*)  11.6 - 15.2 seconds     INR  2.01 (*)  0.00 - 1.49    BASIC METABOLIC PANEL Status: Abnormal    Collection Time    05/18/12 4:10 AM   Component  Value  Range  Comment    Sodium  143  135 - 145 mEq/L     Potassium  3.7  3.5 - 5.1 mEq/L     Chloride  107  96 - 112 mEq/L     CO2  28  19 - 32 mEq/L     Glucose, Bld  106 (*)  70 - 99 mg/dL     BUN  20  6 - 23 mg/dL     Creatinine, Ser  9.56  0.50 - 1.35 mg/dL     Calcium  8.4  8.4 - 10.5 mg/dL     GFR calc non Af Amer  66 (*)  >90 mL/min     GFR calc Af Amer  77 (*)  >90 mL/min    CBC Status: Abnormal    Collection Time    05/18/12 4:10 AM   Component  Value  Range  Comment    WBC  7.3  4.0 - 10.5 K/uL     RBC  2.90 (*)  4.22 - 5.81 MIL/uL     Hemoglobin  9.0 (*)  13.0 - 17.0 g/dL     HCT  21.3 (*)  08.6 - 52.0 %     MCV  93.4  78.0 - 100.0 fL     MCH  31.0  26.0 - 34.0 pg     MCHC  33.2  30.0 - 36.0 g/dL     RDW  57.8  46.9 - 62.9 %     Platelets  211  150 - 400  K/uL    HEMOGLOBIN A1C Status: Abnormal    Collection Time    05/18/12 4:10 AM   Component  Value  Range  Comment    Hemoglobin A1C  5.9 (*)  <5.7 %     Mean Plasma Glucose  123 (*)  <117 mg/dL    HOMOCYSTEINE Status: Normal    Collection Time    05/18/12 3:04 PM   Component  Value  Range  Comment    Homocysteine  8.3  4.0 - 15.4 umol/L    HEPARIN LEVEL (UNFRACTIONATED) Status: Normal    Collection Time    05/19/12 5:00 AM   Component  Value  Range  Comment    Heparin Unfractionated  0.43  0.30 - 0.70 IU/mL    PROTIME-INR Status: Abnormal    Collection Time    05/19/12 5:00 AM   Component  Value  Range  Comment    Prothrombin Time  27.1 (*)  11.6 - 15.2 seconds     INR  2.67 (*)  0.00 - 1.49     No results found.  Post Admission Physician Evaluation:  1. Functional deficits secondary to deconditioning after cardiac arrest, PE, multiple medical, anoxic encephalopathy. 2. Patient is admitted to receive collaborative, interdisciplinary care between the physiatrist, rehab nursing staff, and therapy team. 3. Patient's level of medical complexity and substantial therapy needs in context of that medical necessity cannot be provided at a lesser intensity of care such as a SNF. 4. Patient has experienced substantial functional loss from his/her baseline which was documented above under the "Functional History" and "Functional Status" headings. Judging by the patient's diagnosis, physical exam, and functional history, the patient has potential for functional progress which will result in measurable gains while on inpatient rehab. These gains will be of substantial and practical use upon discharge in facilitating mobility and self-care at the household level. 5. Physiatrist will provide 24 hour management of medical needs as well as oversight of the therapy plan/treatment and provide guidance as appropriate regarding the interaction of the two. 6. 24 hour rehab nursing will assist with bladder  management, bowel management, safety, skin/wound care, disease management, medication administration, pain management and patient education and help integrate therapy concepts, techniques,education, etc. 7. PT will assess and treat for: Lower extremity strength, range of motion, stamina, balance, functional mobility, safety, adaptive techniques and equipment, education. Goals are: supervision. 8. OT will assess and treat for:ADL's, functional mobility, safety, upper extremity strength, adaptive techniques and equipment. Goals are: supervision to min assist. 9. SLP will assess and treat for: memory, awareness, insight. Goals are: supervision to mod I. 10. Case Management and Social Worker will assess and treat for psychological issues and discharge planning. 11. Team conference will be held weekly to assess progress toward goals and to determine barriers to discharge. 12. Patient will receive at least 3 hours of therapy per day at least 5 days per week. 13. ELOS: 10-12 days Prognosis: excellent Medical Problem List and Plan:  1.PE: Pharmaceutical: Coumadin  2. Pain Management: N/A  3. Mood: seems to be in good spirits.  4. Neuropsych: This patient is capable of making decisions on his/her own behalf.  5. VF cardiac arrest: LifeVest in place.  6. NSTEMI s/p PTCA: continue ASA, Brilinta and coumadin for a month then ASA and coumadin  7. Pulmonary edema: check daily weights. Continue low salt restrictions. Continue lasix and pironalactone.  8. Recent SBO with lysis of adhesions: will schedule miralax to prevent constipation. Monitor and adjust bowel program as needed.  9. CAD: on coreg, lipitor, and lisinopril . Continue blood thinners for stent  Ivory Broad, MD

## 2012-05-20 NOTE — Progress Notes (Addendum)
Subjective: No CP or SOB Objective: Filed Vitals:   05/19/12 1022 05/19/12 1729 05/19/12 2103 05/20/12 0509  BP: 114/65 107/69 99/54 102/60  Pulse:  100 81 98  Temp:   98.5 F (36.9 C) 98.8 F (37.1 C)  TempSrc:   Oral Oral  Resp:   18 18  Height:      Weight:      SpO2:   99% 98%   Weight change:   Intake/Output Summary (Last 24 hours) at 05/20/12 0829 Last data filed at 05/20/12 0509  Gross per 24 hour  Intake    600 ml  Output    700 ml  Net   -100 ml    General: Thin, Alert, awake, oriented x3, in no acute distress Neck:  JVP is normal Heart: Regular rate and rhythm, without murmurs, rubs, gallops.  Lungs: Clear to auscultation.  No rales or wheezes. Exemities:  No edema.   Neuro:  Deferred exam  Tele:  SR Lab Results: Results for orders placed during the hospital encounter of 05/10/12 (from the past 24 hour(s))  PROTIME-INR     Status: Abnormal   Collection Time   05/20/12  6:05 AM      Component Value Range   Prothrombin Time 25.9 (*) 11.6 - 15.2 seconds   INR 2.51 (*) 0.00 - 1.49  BASIC METABOLIC PANEL     Status: Abnormal   Collection Time   05/20/12  6:05 AM      Component Value Range   Sodium 140  135 - 145 mEq/L   Potassium 3.8  3.5 - 5.1 mEq/L   Chloride 106  96 - 112 mEq/L   CO2 26  19 - 32 mEq/L   Glucose, Bld 104 (*) 70 - 99 mg/dL   BUN 18  6 - 23 mg/dL   Creatinine, Ser 4.54  0.50 - 1.35 mg/dL   Calcium 8.3 (*) 8.4 - 10.5 mg/dL   GFR calc non Af Amer 79 (*) >90 mL/min   GFR calc Af Amer >90  >90 mL/min    Studies/Results: @RISRSLT24 @  Medications:  Reviewed.   Patient Active Hospital Problem List: Ventricular fibrillation arrest (05/10/2012)   Assessment: Filtted for life vest.  Plan to reeval LVEF by echo in ?1 month for consideration of ICD  HYPERLIPIDEMIA (04/17/2009)   Assessment: Continue lipitor 80  HYPERTENSION (04/17/2009)   Assessment: Controlled    Coronary artery disease (07/09/2011)  Patient with severe CAD s/p CABG  in 96 Cath (SVG to OM1/OM2 patient; SVG to diag patient; SVG to RCA 100% (old); LIMA to LAD atrretic (old); LAD 100% proximal (in stent); SVG 99% prox; RCA 70% prox.   Assessment: s/p PTCA/thrombectomy to LAD No stent.  Continue ASA/Brilinta/Coumadin for 1 month the ncoumadin and ASA.  Acute systolic congestive heart failure, NYHA class 3/EF 20-25% (05/17/2012)   Assessment: Reassess LVEF in 1 month from admit.  Continue meds.  PE (pulmonary embolism) (05/17/2012)   Assessment: Small PE on CT  Continue coumadin  Patient to tx to rehab today.  LOS: 10 days   Dietrich Pates 05/20/2012, 8:29 AM

## 2012-05-20 NOTE — Progress Notes (Signed)
Patient admitted from unit 2000.  Alert and oriented x3.  Patient and son oriented to the unit.  Unstageable pressure ulcer noted to right buttock, stage II pressure ulcer to sacrum, dry/flaky skin, bruising to BUE and BLE, abdominal incision with steri strips.  Life vest intact.  Refuses to watch safety video at this time; requests to watch it tomorrow.  No complaints of pain.  Will continue to monitor.

## 2012-05-20 NOTE — Progress Notes (Signed)
CARDIAC REHAB PHASE I   PRE:  Rate/Rhythm: 84 SR  BP:  Supine: 108/67  Sitting:   Standing:    SaO2: 98 RA  MODE:  Ambulation: 58 ft   POST:  Rate/Rhythem: 83 SR  BP:  Supine:   Sitting: 116/71  Standing:    SaO2: 100 RA 1050-1155 Cardiac Rehab Assisted X 2 used walker and gait belt to ambulate. Gait more steady today. Pt following instructions very well with staying inside walker and being mindful of posture. He was able to walk 58 feet today without c/o but was tired by end of walk. VS stable. Pt to recliner after walk with call light in reach. Completed MI and CHF education with pt and son. Pt agrees to Outpt. CRP in [place], will send referral. They voice understanding of education provided.  [name]

## 2012-05-20 NOTE — Clinical Social Work Note (Signed)
CSW following up to assist with dc. Pt was given snf bed offers on Wednesday evening, but has been approved for CIR with plans to go there today. Pt remains in pleasant mood and appreciative of staff support with dc planning process.  CSW signing off as Pt discharging to CIR today.   Frederico Hamman, LCSW [phone]

## 2012-05-20 NOTE — Progress Notes (Signed)
Life Vest at bedside. Discussed with RN and Dr. Tenny Craw. Will plan admit to inpt rehab today. I will contact Dr. Waymon Amato. [phone]

## 2012-05-20 NOTE — Plan of Care (Signed)
Overall Plan of Care [facility]) Patient Details Name: Louis Reid MRN: [id number] DOB: June 06, 1933  Diagnosis:  Rehabilitation for deconditioning  Primary Diagnosis:    Physical deconditioning after acute MI and PE Co-morbidities: Acute myocardial infarction of anterolateral wall, episode of care unspecified  .  Coronary atherosclerosis of artery bypass graft  .  Sinoatrial node dysfunction  .  HTN (hypertension)  .  HLD (hyperlipidemia)  .  Depression  .  History of kidney stones  .  Congenital funnel chest  .  Inguinal hernia  .  Osteoporosis    Functional Problem List  Patient demonstrates impairments in the following areas: Balance, Cognition, Endurance and Safety  Basic ADL's: grooming, bathing, dressing and toileting Advanced ADL's: simple meal preparation  Transfers:  bed mobility, bed to chair, toilet, tub/shower and car Locomotion:  ambulation and stairs  Additional Impairments:  None and Social Cognition   memory, attention and awareness  Anticipated Outcomes Item Anticipated Outcome  Eating/Swallowing  N/A  Basic self-care  Mod I  Tolieting  Mod I  Bowel/Bladder  Continent of bowel and bladder  Transfers  Mod-I  Locomotion  Mod-I in home and S/Mod-I in community  Communication    Cognition  Mod-I in home and Sup in community  Pain  </=3  Safety/Judgment  Mod-I in home and S/Mod-I in community  Other     Therapy Plan: PT Intensity: Minimum of 1-2 x/day ,45 to 90 minutes PT Duration Estimated Length of Stay: 5-10 days PT Treatment/Interventions: Ambulation/gait training;Balance/vestibular training;Cognitive remediation/compensation;DME/adaptive equipment instruction;Functional mobility training;Patient/family education;Stair training;Therapeutic Activities;Therapeutic Exercise;UE/LE Strength taining/ROM OT Intensity: Minimum of 1-2 x/day, 45 to 90 minutes OT Frequency: 5 out of 7 days OT Duration/Estimated Length of Stay: 5-7 days OT  Treatment/Interventions: Balance/vestibular training;Cognitive remediation/compensation;Discharge planning;Community reintegration;DME/adaptive equipment instruction;Functional mobility training;Neuromuscular re-education;Pain management;Patient/family education;Psychosocial support;Self Care/advanced ADL retraining;Skin care/wound managment;Therapeutic Activities;Therapeutic Exercise;UE/LE Strength taining/ROM;UE/LE Coordination activities;Wheelchair propulsion/positioning SLP Intensity: Minimum of 1-2 x/day, 45 to 90 minutes  SLP Frequency:  5 out of 7 days  SLP Treatment/Interventions: Cognitive remediation/compensation;Cueing hierarchy;Functional tasks;Patient/family education;Environmental controls;Internal/external aids;Other (comment)      Therapy Recommendations: PT @([phone]::1)@ OT @([id number]::2)@ SLP  @([id number]::6)@  Team Interventions: Item RN PT OT SLP SW TR Other  Self Care/Advanced ADL Retraining   x      Neuromuscular Re-Education         Therapeutic Activities  x x x     UE/LE Strength Training/ROM  x x      UE/LE Coordination Activities         Visual/Perceptual Remediation/Compensation         DME/Adaptive Equipment Instruction  x x      Therapeutic Exercise  x x      Balance/Vestibular Training  x x x     Patient/Family Education x x x x     Cognitive Remediation/Compensation  x x x     Functional Mobility Training  x x      Ambulation/Gait Training  x       Stair Training  x       Wheelchair Propulsion/Positioning   x      Functional Tourist information centre manager Reintegration    x     Dysphagia/Aspiration Landscape architect Facilitation         Bladder Management x        Bowel Management x        Disease Management/Prevention  x        Pain Management x  x      Medication Management x        Skin Care/Wound Management x        Splinting/Orthotics         Discharge Planning         Psychosocial Support x   x                         Team Discharge Planning: Destination:  Home Projected Follow-up:  PT, OT, Speech and Home Health Projected Equipment Needs:  Shower Seat Patient/family involved in discharge planning:  Yes  MD ELOS: 10 days Medical Rehab Prognosis:  Good Assessment: 78 yo male admitted after cardiac arrest, further w/u revealed MI and PE, now requiring 24/7 rehab RN/MD, CIR PT/OT

## 2012-05-20 NOTE — Discharge Summary (Signed)
Physician Discharge Summary  Louis Reid ZOX:[id number] DOB: 24-Dec-1932 DOA: 05/10/2012  PCP: [name], MD  Admit date: 05/10/2012 Discharge date: 05/20/2012  Time spent: Greater than 30 minutes  Recommendations for Outpatient Follow-up:  1. With Dr. Windle Guard, PCP. Patient or family to make appointment to followup after patient discharged from rehabilitation. 2. With Dr. Verne Carrow, [place] cardiology-patient or family to make followup appointment for patient to be seen a week after discharge from rehabilitation. 3. Repeat complete thyroid function tests on 05/21/2012 to address possible hypothyroidism. 4. Recommend pharmacy consultation for Coumadin management, while patient is in inpatient rehabilitation.   Discharge Diagnoses:  Principal Problem:  *Ventricular fibrillation arrest Active Problems:  HYPERLIPIDEMIA  HYPERTENSION  Coronary artery disease  History of coronary artery bypass surgery  Acute respiratory failure with hypoxia 2/2 cardiac arrest  Acute encephalopathy 2/2 cardiac arrest  Hyperglycemia  Cardiogenic shock  Acute systolic congestive heart failure, NYHA class 3/EF 20-25%  Anemia of critical illness  PE (pulmonary embolism)  Physical deconditioning  Anorexia  STEMI (ST elevation myocardial infarction)   Discharge Condition: Improved and stable  Diet recommendation: Heart healthy.  Filed Weights   05/17/12 0725 05/18/12 0649 05/19/12 0624  Weight: 52.5 kg (115 lb 11.9 oz) 51.6 kg (113 lb 12.1 oz) 54.296 kg (119 lb 11.2 oz)    History of present illness:  78 year old male patient admitted on 06/06/2012 with ventricular fibrillation cardiac arrest secondary cardiac ischemia. He required mechanical ventilation and was started on the hypothermia protocol. CT of the chest was completed on 05/12/2012 and revealed a small pulmonary embolism. Lower extremity Dopplers revealed no evidence of DVT. Patient had recently been hospitalized  for 11 days and was discharged 05/09/2012 after small bowel obstruction that required exploratory laparotomy with lysis of adhesions. Patient eventually underwent cardiac catheterization with intervention to an occluded LAD. He stabilized enough to be weaned off of pressors and was extubated and deemed appropriate to transfer to telemetry on 05/16/2012.   SIGNIFICANT EVENTS:  12/3 Bought to [facility] ED after suffering VF cardiac arrest and ROSC after brief CPR and shocks x 3. Hypothermia protocol started.  12/3- cath- PTCA (balloon angioplasty) only proximal/mid LAD Thrombectomy occluded LAD  12/4- hypothermia continuous, oozing mouth, urine  12/5-acidosis, bleeding resolved, shock worse  12/5 CT chest - lingula PE, small efusions, Ct head - neg acute  12/5 CT abndo/eplvis - Distended gallbladder, containing sludge versus vicariously<BR>excreted contrast.<BR>3. Resolution of small bowel dilatation since prior exam. No<BR>evidence of abscess  12/6- follows commands on rewarm  12/7- low dose pressor needs  12/8- neg 2.1 liters  Hospital Course:   Ventricular fibrillation arrest due to: Anterior MI/Coronary artery disease s/p PTCA LAD--History of coronary artery bypass surgery  Seen and managed by cardiology. Status post balloon angioplasty/thrombectomy to occluded LAD (no stent). Continue aspirin 81 mg, Brilinta, and atorvastatin 80 mg. Per cardiology, envision short course of aspirin 81 mg + Brilinta + Coumadin approximately one month followed by Coumadin + aspirin 81 mg until he can come off Coumadin.  Life Vest arranged at discharge.  Repeat echo in a month and? ICD. Cardiology has cleared patient for discharge to inpatient rehabilitation.  Acute respiratory failure with hypoxia 2/2 cardiac arrest, pulmonary edema and PE.  Resolved.  This was managed by the critical care team in the intensive care unit.  Cardiogenic shock  Resolved  HYPERTENSION  Controlled.  Continue low dose ACE  inhibitors and carvedilol.   Acute encephalopathy 2/2 cardiac arrest  Improving.  Patient  still has memory deficits but seemed to be improving.   Acute systolic congestive heart failure, NYHA class 3/EF 20-25%  Compensated-management per cardiology Continue low dose Lasix, lisinopril and carvedilol.   Acute pulmonary embolism-small  Lower extremity Dopplers are negative and patient had a tiny focal pulmonary embolism  Pulmonology recommends anticoagulation with Coumadin for 6 months.  Pharmacy managing Coumadin  INR therapeutic for 3 days. Heparin discontinued 12/12  Physical deconditioning  D/C to inpatient rehabilitation services on 12/13  HYPERLIPIDEMIA  Continue statin-Changed from Zocor to Lipitor.   Hyperglycemia  A1C 5.9 suggesting prediabetes.  Anemia of critical illness  Stable.  Abnormal TFTs   Rule out hypothyroidism. Patient clinically appears euthyroid.   Repeat full TFTs on 12/14 and address appropriately-if suggestive of hypothyroidism then may consider starting Synthroid.   Anorexia   continue nutritional supplements.     DVT prophylaxis: Anticoagulated on Coumadin  Code Status: Partial code with no CPR and no defibrillation  Family Communication: D/W son at bedside.   Consultants:  Cardiology  Pulmonary   Procedures:  12/3 L [place] CVL >>> removed 12/3 cardiac catheterization with balloon angioplasty to proximal and mid LAD   CULTURES:  12/5 BC>>>  12/5 sputum>>>negative   Antibiotics:  vanc 12/5>>>12/9  Zosyn 12/5>>>12/9  myco 12/5>>>12/6  Discharge Exam:  Complaints Denies complaints. Appetite improving.  Filed Vitals:   05/19/12 1729 05/19/12 2103 05/20/12 0509 05/20/12 1014  BP: 107/69 99/54 102/60 108/59  Pulse: 100 81 98   Temp:  98.5 F (36.9 C) 98.8 F (37.1 C)   TempSrc:  Oral Oral   Resp:  18 18   Height:      Weight:      SpO2:  99% 98%     General: Comfortable. Sitting upon returning chair.  Lungs: Clear to  auscultation bilaterally. No increased work of breathing.  Cardiovascular: Regular rate and rhythm without murmur gallop or rub normal S1 and S2, no JVD and no peripheral edema. Telemetry shows sinus rhythm.  Abdomen: Nontender, nondistended, soft, bowel sounds positive, no rebound, no ascites, no appreciable mass  Extremities: Symmetric 5 x 5 power  Neurological: Patient is alert and oriented to name and place only. No focal neurological deficits   Discharge Instructions      Discharge Orders    Future Appointments: Provider: Department: Dept Phone: Center:   05/21/2012 8:00 AM [place] Party [facility]  [address] [facility]  REHAB [phone] None   05/21/2012 9:30 AM Chales Abrahams, CCC-SLP [facility]  [address] INPATIENT  REHAB [phone] None   05/21/2012 11:00 AM Karrie Meres, PT [facility]  [address] INPATIENT  REHAB [id number] None   05/22/2012 9:00 AM Rich Brave, OTA [facility]  [address] INPATIENT  REHAB [phone] None   06/09/2012 1:30 PM Iva Boop, MD [facility] [id number] [facility]     Future Orders Please Complete By Expires   Amb Referral to Cardiac Rehabilitation      Diet - low sodium heart healthy      Increase activity slowly      Call MD for:  difficulty breathing, headache or visual disturbances      (HEART FAILURE PATIENTS) Call MD:  Anytime you have any of the following symptoms: 1) 3 pound weight gain in 24 hours or 5 pounds in 1 week 2) shortness of breath, with or without a dry hacking cough 3) swelling in the hands, feet or stomach 4) if you have  to sleep on extra pillows at night in order to breathe.      Discharge instructions      Comments:   Coumadin to be dosed per pharmacy, while in In Patient Rehab.       Medication List     As of 05/20/2012 12:48 PM    STOP taking these medications         benazepril 10 MG tablet   Commonly known as:  LOTENSIN      sertraline 50 MG tablet   Commonly known as: ZOLOFT      simvastatin 20 MG tablet   Commonly known as: ZOCOR      TAKE these medications         aspirin 81 MG tablet   Take 1 tablet (81 mg total) by mouth daily.      atorvastatin 80 MG tablet   Commonly known as: LIPITOR   Take 1 tablet (80 mg total) by mouth daily at 6 PM.      carvedilol 3.125 MG tablet   Commonly known as: COREG   Take 1 tablet (3.125 mg total) by mouth 2 (two) times daily with a meal.      docusate sodium 100 MG capsule   Commonly known as: COLACE   Take 100 mg by mouth daily as needed. constipation      feeding supplement Liqd   Take 237 mLs by mouth 2 (two) times daily between meals.      furosemide 20 MG tablet   Commonly known as: LASIX   Take 1 tablet (20 mg total) by mouth daily.      lisinopril 2.5 MG tablet   Commonly known as: PRINIVIL,ZESTRIL   Take 1 tablet (2.5 mg total) by mouth daily.      multivitamin with minerals Tabs   Take 1 tablet by mouth daily.      pantoprazole 40 MG tablet   Commonly known as: PROTONIX   Take 1 tablet (40 mg total) by mouth 2 (two) times daily before a meal.      spironolactone 12.5 mg Tabs   Commonly known as: ALDACTONE   Take 0.5 tablets (12.5 mg total) by mouth daily.      sucralfate 1 GM/10ML suspension   Commonly known as: CARAFATE   Take 10 mLs (1 g total) by mouth 4 (four) times daily -  with meals and at bedtime.      Ticagrelor 90 MG Tabs tablet   Commonly known as: BRILINTA   Take 1 tablet (90 mg total) by mouth 2 (two) times daily.      warfarin 2.5 MG tablet   Commonly known as: COUMADIN   Take 1 tablet (2.5 mg total) by mouth one time only at 6 PM.         Follow-up Information    Schedule an appointment as soon as possible for a visit with [name], MD.   Contact information:   [address] [place] [address] [phone]       Schedule an appointment as soon as possible for a visit  with MCALHANY,CHRISTOPHER, MD.   Contact information:   [address] [place] [address] [phone]           The results of significant diagnostics from this hospitalization (including imaging, microbiology, ancillary and laboratory) are listed below for reference.    Significant Diagnostic Studies:  Ct Head Wo Contrast  05/12/2012  *RADIOLOGY REPORT*  Clinical Data: Altered  mental status.  Cardiac arrest.  CT HEAD WITHOUT CONTRAST  Technique:  Contiguous axial images were obtained from the base of the skull through the vertex without contrast.  Comparison: None.  Findings: There is no acute intracranial hemorrhage, infarction, or mass lesion.  There is mild diffuse cerebral cortical and cerebellar atrophy.  No ventricular dilatation.  There is no brain edema or appreciable small vessel disease.  Osseous structures are normal.  IMPRESSION: Slight atrophy.  Otherwise, normal exam.  No brain edema.   Original Report Authenticated By: Francene Boyers, M.D.    Ct Angio Chest Pe W/cm &/or Wo Cm  05/12/2012  *RADIOLOGY REPORT*  Clinical Data: Pulmonary infiltrates.  Cardiac arrest.  CT ANGIOGRAPHY CHEST  Technique:  Multidetector CT imaging of the chest using the standard protocol during bolus administration of intravenous contrast. Multiplanar reconstructed images including MIPs were obtained and reviewed to evaluate the vascular anatomy.  Contrast: OMNIPAQUE IOHEXOL 300 MG/ML  SOLN  Comparison: Chest x-ray dated 05/12/2012  Findings: There is a single small pulmonary embolus and artery to the lingula of the left lung.  No other pulmonary emboli.  There are moderate bilateral pleural effusions with secondary atelectasis in the lower lobes.  The patient has a marked pectus excavatum deformity.  Extensive coronary artery calcification. Previous CABG.  No hilar or mediastinal adenopathy.   No acute osseous abnormality.  Ascites is noted in the upper abdomen.  IMPRESSION:  1.  Tiny  pulmonary embolus in the lingula.  No other emboli. 2.  Moderate bilateral pleural effusions.   Original Report Authenticated By: Francene Boyers, M.D.    Ct Abdomen Pelvis W Contrast  05/12/2012  *RADIOLOGY REPORT*  Clinical Data: Abscess.  Small bowel obstruction.  CT ABDOMEN AND PELVIS WITH CONTRAST  Technique:  Multidetector CT imaging of the abdomen and pelvis was performed following the standard protocol during bolus administration of intravenous contrast.  Contrast: OMNIPAQUE IOHEXOL 300 MG/ML  SOLN  Comparison: 04/24/2012  Findings: Severe pectus excavatum again noted. The abdominal parenchymal organs are unremarkable in appearance.  No evidence of hydronephrosis.  No soft tissue masses or lymphadenopathy identified.  The gallbladder is now distended and contains high attenuation sludge or vicariously excreted contrast material.  No definite evidence of gallbladder wall thickening.  Small bowel dilatation has resolved since previous study.  Mild ascites is seen which is new, and there is also increase in diffuse body wall edema and bilateral pleural effusions.  No focal inflammatory process or abscess identified.  Foley catheter is seen within the bladder.  Mildly enlarged prostate gland is stable.  A small amount of fluid noted in the left inguinal canal.  IMPRESSION:  1.  New mild ascites and diffuse body wall edema.  Bilateral pleural effusions also noted. 2.  Distended gallbladder, containing sludge versus vicariously excreted contrast. 3.  Resolution of small bowel dilatation since prior exam.  No evidence of abscess.   Original Report Authenticated By: Myles Rosenthal, M.D.     Dg Chest Port 1 View  05/15/2012  *RADIOLOGY REPORT*  Clinical Data: Assessed pulmonary edema.  Intubated patient.  PORTABLE CHEST - 1 VIEW  Comparison: 05/14/2012  Findings: Bilateral pleural effusions with associated atelectasis is stable.  The lungs are hyperexpanded.  There is no pneumothorax.  Changes from CABG  surgery are stable.  The endotracheal tube, nasogastric tube and left subclavian central venous line remain well positioned and unchanged.  IMPRESSION: No convincing active pulmonary edema.  Bilateral pleural effusions with lung base  opacity, most likely atelectasis.   Original Report Authenticated By: Amie Portland, M.D.    Dg Chest Port 1 View  05/14/2012  *RADIOLOGY REPORT*  Clinical Data: Edema.  PORTABLE CHEST - 1 VIEW  Comparison: 05/13/2012  Findings: Support devices are unchanged. Prior CABG.  Stable cardiomegaly.  Mild bilateral airspace disease, left greater than right, with small bilateral effusions, also left greater than right.  No real change since prior study.  IMPRESSION: No significant change asymmetric airspace disease and effusions, left greater than right, likely asymmetric edema.   Original Report Authenticated By: Charlett Nose, M.D.    Dg Chest Port 1 View  05/13/2012  *RADIOLOGY REPORT*  Clinical Data: Assess endotracheal.  PORTABLE CHEST - 1 VIEW  Comparison: Portable chest x-ray of 05/12/2012 and CT angio chest of the same date  Findings: The tip of the endotracheal tube is approximately 5.0 cm above the carina.  Basilar opacities remain left greater than right consistent with atelectasis, effusion, possibly left basilar pneumonia.  A left central venous line is unchanged in position. Heart size is stable.  IMPRESSION:  1.  Tip of endotracheal tube 5.0 cm above the carina. 2.  Little change in basilar opacities left greater than right.   Original Report Authenticated By: Dwyane Dee, M.D.    Dg Chest Port 1 View  05/12/2012  *RADIOLOGY REPORT*  Clinical Data: Evaluate endotracheal tube  PORTABLE CHEST - 1 VIEW  Comparison: 05/11/2012; 05/10/2012  Findings:  Grossly unchanged cardiac silhouette and mediastinal contours post median sternotomy and CABG.  Stable positioning of support apparatus including external pacing devices.  No definite pneumothorax, though the right lung apex is  excluded from view. The lungs again appear hyperinflated.  Pulmonary vascular is indistinct.  Unchanged small bilateral layering effusions and bibasilar heterogeneous opacities.  No new focal airspace opacities.  Unchanged bones.  IMPRESSION: 1.  Stable positioning of support apparatus.  No pneumothorax. 2.  Grossly unchanged mild pulmonary edema, small bilateral effusions and bibasilar opacities, favored to represent atelectasis.   Original Report Authenticated By: Tacey Ruiz, MD    Dg Chest Port 1 View  05/11/2012  *RADIOLOGY REPORT*  Clinical Data: Ventilator.  PORTABLE CHEST - 1 VIEW  Comparison: 05/10/2012.  Findings: Endotracheal tube is in satisfactory position. Nasogastric tube is followed into the stomach.  Left subclavian central line tip projects over the SVC.  Defibrillator pads are in place.  A wired line tip projects over the lower mediastinum. Question if this is external to the patient.  Bibasilar air space disease, with slight worsening on the right.  No pleural fluid.  IMPRESSION: Bibasilar air space disease with slight interval worsening on the right.   Original Report Authenticated By: Leanna Battles, M.D.    Dg Chest Port 1 View  05/10/2012  *RADIOLOGY REPORT*  Clinical Data: Acute respiratory failure.  On ventilator.  Central line placement.  PORTABLE CHEST - 1 VIEW  Comparison: Prior today  Findings: A new left subclavian center venous catheter is seen with tip in the distal SVC.  Endotracheal tube and nasogastric tube remain in appropriate position.  No pneumothorax identified.  Decreased lung volumes are seen with increased bibasilar atelectasis.  Heart size is stable.  Prior CABG again noted.  IMPRESSION:  1.  New left subclavian center venous catheter in appropriate position.  No evidence of pneumothorax. 2.  Increased bibasilar atelectasis.   Original Report Authenticated By: Myles Rosenthal, M.D.    Dg Chest Port 1 View  05/10/2012  *RADIOLOGY REPORT*  Clinical Data: Intubation   PORTABLE CHEST - 1 VIEW  Comparison: None.  Findings: Endotracheal tube in good position.  There is a gastric tube in the proximal esophagus or trachea overlying T1. Repositioning is suggested.  COPD.  Prior CABG.  Lungs are clear without infiltrate or effusion. Left apical scarring  IMPRESSION:  Endotracheal tube in good position.  Gastric tube overlies the trachea or proximal esophagus at the T1 level.  Repositioning is suggested.   Original Report Authenticated By: Janeece Riggers, M.D.    2-D echo  Study Conclusions  - Left ventricle: The cavity size was normal. Wall thickness was normal. Systolic function was severely reduced. The estimated ejection fraction was in the range of 20% to 25%. There is akinesis of the anteroseptal, inferior, and apical myocardium. - Right ventricle: Systolic function was mildly reduced. - Pericardium, extracardiac: A trivial pericardial effusion was identified. There was a left pleural effusion.    Microbiology: Recent Results (from the past 240 hour(s))  CULTURE, RESPIRATORY     Status: Normal   Collection Time   05/11/12  9:40 AM      Component Value Range Status Comment   Specimen Description OTHER   Final    Special Requests ETT SUCTION   Final    Gram Stain     Final    Value: MODERATE WBC PRESENT, PREDOMINANTLY PMN     FEW SQUAMOUS EPITHELIAL CELLS PRESENT     MODERATE GRAM NEGATIVE COCCI     FEW GRAM NEGATIVE RODS   Culture Non-Pathogenic Oropharyngeal-type Flora Isolated.   Final    Report Status 05/13/2012 FINAL   Final   CULTURE, BLOOD (ROUTINE X 2)     Status: Normal   Collection Time   05/11/12 11:29 AM      Component Value Range Status Comment   Specimen Description BLOOD LEFT ARM   Final    Special Requests BOTTLES DRAWN AEROBIC AND ANAEROBIC 10CC   Final    Culture  Setup Time 05/11/2012 21:55   Final    Culture     Final    Value:        BLOOD CULTURE RECEIVED NO GROWTH TO DATE CULTURE WILL BE HELD FOR 5 DAYS BEFORE ISSUING A FINAL  NEGATIVE REPORT   Report Status 05/17/2012 FINAL   Final   CULTURE, BLOOD (ROUTINE X 2)     Status: Normal   Collection Time   05/11/12 11:39 AM      Component Value Range Status Comment   Specimen Description BLOOD LEFT FOREARM   Final    Special Requests BOTTLES DRAWN AEROBIC ONLY 10CC   Final    Culture  Setup Time 05/11/2012 21:55   Final    Culture     Final    Value:        BLOOD CULTURE RECEIVED NO GROWTH TO DATE CULTURE WILL BE HELD FOR 5 DAYS BEFORE ISSUING A FINAL NEGATIVE REPORT   Report Status 05/17/2012 FINAL   Final      Labs: Basic Metabolic Panel:  Lab 05/20/12 1191 05/18/12 0410 05/17/12 0520 05/16/12 0420 05/15/12 1947 05/15/12 1600  NA 140 143 141 142 -- 144  K 3.8 3.7 3.3* 4.0 -- 4.1  CL 106 107 105 108 -- 110  CO2 26 28 28 28  -- 27  GLUCOSE 104* 106* 99 101* -- 109*  BUN 18 20 22  25* -- 26*  CREATININE 0.93 1.05 1.09 1.05 -- 0.99  CALCIUM 8.3* 8.4 8.6 8.0* --  7.3*  MG -- -- -- 2.0 1.9 --  PHOS -- -- -- 1.5* -- --   Liver Function Tests:  Lab 05/14/12 0500  AST 60*  ALT 39  ALKPHOS 70  BILITOT 0.3  PROT 4.8*  ALBUMIN 1.9*   No results found for this basename: LIPASE:5,AMYLASE:5 in the last 168 hours No results found for this basename: AMMONIA:5 in the last 168 hours CBC:  Lab 05/18/12 0410 05/17/12 0520 05/16/12 0420 05/15/12 1947 05/15/12 0435 05/14/12 0500  WBC 7.3 6.2 8.6 10.9* 15.5* --  NEUTROABS -- -- 7.6 9.9* 14.3* 17.0*  HGB 9.0* 9.5* 9.2* 8.8* 9.4* --  HCT 27.1* 28.2* 27.8* 26.4* 28.5* --  MCV 93.4 93.7 95.9 94.6 96.0 --  PLT 211 219 219 224 250 --   Cardiac Enzymes:  Lab 05/16/12 0747 05/16/12 0147 05/15/12 1947  CKTOTAL -- -- --  CKMB -- -- --  CKMBINDEX -- -- --  TROPONINI 14.16* 15.02* 13.28*   BNP: BNP (last 3 results) No results found for this basename: PROBNP:3 in the last 8760 hours CBG:  Lab 05/16/12 0804 05/16/12 0414 05/16/12 05/15/12 2051 05/15/12 1614  GLUCAP 106* 78 111* 104* 108*    Other lab data   INR:  2.51  Hemoglobin A1c: 5.9  TFTs: TSH 9.927, free T4-1.21 and total T3 49  Hepatitis Panel: Negative.   SignedMarcellus Scott  [facility] 05/20/2012, 12:48 PM

## 2012-05-21 ENCOUNTER — Inpatient Hospital Stay (HOSPITAL_COMMUNITY): Payer: Medicare Other | Admitting: *Deleted

## 2012-05-21 ENCOUNTER — Inpatient Hospital Stay (HOSPITAL_COMMUNITY): Payer: Medicare Other | Admitting: Physical Therapy

## 2012-05-21 ENCOUNTER — Inpatient Hospital Stay (HOSPITAL_COMMUNITY): Payer: Medicare Other

## 2012-05-21 DIAGNOSIS — G931 Anoxic brain damage, not elsewhere classified: Secondary | ICD-10-CM

## 2012-05-21 DIAGNOSIS — R5381 Other malaise: Secondary | ICD-10-CM

## 2012-05-21 MED ORDER — WARFARIN SODIUM 2.5 MG PO TABS
2.5000 mg | ORAL_TABLET | Freq: Once | ORAL | Status: AC
  Administered 2012-05-21: 2.5 mg via ORAL
  Filled 2012-05-21: qty 1

## 2012-05-21 NOTE — Plan of Care (Deleted)
Problem: RH BLADDER ELIMINATION Goal: RH STG MANAGE BLADDER WITH ASSISTANCE STG Manage Bladder With Mod I  Outcome: Not Progressing Patient with condom catheter at hs. Previous nurse reported patients urine yellow upon arrival to unit. Two hours later patient voided bright red blood in condom catheter. Patient with no complaints of pain or burning sensation. Six hours into shift patient voiding yellow urine. A. Ohanna Gassert,LPN

## 2012-05-21 NOTE — Progress Notes (Signed)
ANTICOAGULATION CONSULT NOTE - Follow Up Consult  Pharmacy Consult for Coumadin Indication: pulmonary embolus  Allergies  Allergen Reactions  . Cold Medicine Plus (Chlorphen-Pseudoephed-Apap)     COLD MEDICATIONS MAKE HIM "LOOPY"    Patient Measurements: Height: 6' (182.9 cm) Weight: 118 lb 2.7 oz (53.6 kg) IBW/kg (Calculated) : 77.6  Heparin Dosing Weight: 51.6 kg  Vital Signs: Temp: 98.1 F (36.7 C) (12/14 0630) Temp src: Oral (12/14 0630) BP: 100/61 mmHg (12/14 0630) Pulse Rate: 75  (12/14 0630)  Labs:  Basename 05/20/12 0605 05/19/12 0500  HGB -- --  HCT -- --  PLT -- --  APTT -- --  LABPROT 25.9* 27.1*  INR 2.51* 2.67*  HEPARINUNFRC -- 0.43  CREATININE 0.93 --  CKTOTAL -- --  CKMB -- --  TROPONINI -- --    Estimated Creatinine Clearance: 50.4 ml/min (by C-G formula based on Cr of 0.93).  Assessment: Louis Reid is a 78 year old male s/p heparin to coumadin bridge for PE. INR is therapeutic. No bleeding noted. Also on Aspirin 81 mg and Brilinta 90 mg BID. Noted short course of Brilinta planned; may stop Aspirin while on Coumadin, if Hgb trends down.   Goal of Therapy:  INR 2-3 Heparin level 0.3-0.7 units/ml Monitor platelets by anticoagulation protocol: Yes   Plan:   Coumadin 2.5 mg po again today.  F/u PT/INR in the AM    Thank you,  Christoper Fabian, PharmD, BCPS Clinical pharmacist, pager [phone] 05/21/2012 10:05 AM

## 2012-05-21 NOTE — Progress Notes (Signed)
Patient ID: Louis Reid, male   DOB: 03/17/1933, 78 y.o.   MRN: [id number] Subjective/Complaints: 78 y.o. male with history of CAD, recent admission for SBO with lysis of adhesions, who was admitted on 05/10/12 past witnessed cardiac arrest. Bystander CPR X 10 mins , AED shock X3. Was noted to be in atrial fibrillation and intubated in ED. He was taken to cath lab and underwent PTCA with thrombectomy of occluded LAD by Dr. Clifton James. He developed hematuria/bloody oral secretions with intermittent VT. Hypokalemia supplemented and integrelin discontinued. CT head done due to lethargy and showed no acute changes. Was started on IV antibiotics for leucocytosis. CT chest with tiny PE in lingula and CT abdomen with distended GB with mild ascites and diffuse body wall edema. Started on heparin drip for PE. BLE dopplers negative for DVT. Tolerated extubation 12/08 and encephalopathy slowly resolving. Diet initiated 12/9 due to improvement in mentation and no s/s of aspiration. LifeVest fitted    Objective: Vital Signs: Blood pressure 100/61, pulse 75, temperature 98.1 F (36.7 C), temperature source Oral, resp. rate 18, height 6' (1.829 m), weight 53.6 kg (118 lb 2.7 oz), SpO2 100.00%. No results found. Results for orders placed during the hospital encounter of 05/10/12 (from the past 72 hour(s))  HOMOCYSTEINE     Status: Normal   Collection Time   05/18/12  3:04 PM      Component Value Range Comment   Homocysteine 8.3  4.0 - 15.4 umol/L   PROTHROMBIN GENE MUTATION     Status: Normal   Collection Time   05/18/12  3:04 PM      Component Value Range Comment   Recommendations-PTGENE: (NOTE)     HEPARIN LEVEL (UNFRACTIONATED)     Status: Normal   Collection Time   05/19/12  5:00 AM      Component Value Range Comment   Heparin Unfractionated 0.43  0.30 - 0.70 IU/mL   PROTIME-INR     Status: Abnormal   Collection Time   05/19/12  5:00 AM      Component Value Range Comment   Prothrombin Time 27.1 (*)  11.6 - 15.2 seconds    INR 2.67 (*) 0.00 - 1.49   PROTIME-INR     Status: Abnormal   Collection Time   05/20/12  6:05 AM      Component Value Range Comment   Prothrombin Time 25.9 (*) 11.6 - 15.2 seconds    INR 2.51 (*) 0.00 - 1.49   BASIC METABOLIC PANEL     Status: Abnormal   Collection Time   05/20/12  6:05 AM      Component Value Range Comment   Sodium 140  135 - 145 mEq/L    Potassium 3.8  3.5 - 5.1 mEq/L    Chloride 106  96 - 112 mEq/L    CO2 26  19 - 32 mEq/L    Glucose, Bld 104 (*) 70 - 99 mg/dL    BUN 18  6 - 23 mg/dL    Creatinine, Ser 4.09  0.50 - 1.35 mg/dL    Calcium 8.3 (*) 8.4 - 10.5 mg/dL    GFR calc non Af Amer 79 (*) >90 mL/min    GFR calc Af Amer >90  >90 mL/min      HEENT: normal Cardio: RRR Resp: CTA B/L GI: mild distention, healing midline incsion non tender Extremity:  Pulses positive and No Edema Skin:   Wound C/D/I Neuro: Alert/Oriented, Anxious, Normal Sensory and Normal Motor Musc/Skel:  Other  Pectus excavatum Gen NAD   Assessment/Plan: 1. Functional deficits secondary to deconditioning , hypoxic encephalopathy resolving which require 3+ hours per day of interdisciplinary therapy in a comprehensive inpatient rehab setting. Physiatrist is providing close team supervision and 24 hour management of active medical problems listed below. Physiatrist and rehab team continue to assess barriers to discharge/monitor patient progress toward functional and medical goals. FIM: FIM - Bathing Bathing Steps Patient Completed: Chest;Right Arm;Left Arm;Abdomen;Front perineal area;Buttocks;Right upper leg;Left upper leg Bathing: 4: Min-Patient completes 8-9 27f 10 parts or 75+ percent  FIM - Upper Body Dressing/Undressing Upper body dressing/undressing: 0: Wears gown/pajamas-no public clothing FIM - Lower Body Dressing/Undressing Lower body dressing/undressing: 0: Wears Oceanographer     FIM - Diplomatic Services operational officer  Devices: Best boy Transfers: 4-To toilet/BSC: Min A (steadying Pt. > 75%);4-From toilet/BSC: Min A (steadying Pt. > 75%)        Comprehension Comprehension Mode: Auditory Comprehension: 5-Follows basic conversation/direction: With extra time/assistive device  Expression Expression Mode: Verbal Expression: 5-Expresses basic needs/ideas: With no assist  Social Interaction Social Interaction: 6-Interacts appropriately with others with medication or extra time (anti-anxiety, antidepressant).  Problem Solving Problem Solving: 5-Solves basic 90% of the time/requires cueing < 10% of the time  Memory Memory: 4-Recognizes or recalls 75 - 89% of the time/requires cueing 10 - 24% of the time  Medical Problem List and Plan:  1.PE: Pharmaceutical: Coumadin  2. Pain Management: N/A  3. Mood: seems to be in good spirits.  4. Neuropsych: This patient is capable of making decisions on his/her own behalf.  5. VF cardiac arrest: LifeVest in place.  6. NSTEMI s/p PTCA: continue ASA, Brilinta and coumadin for a month then ASA and coumadin  7. Pulmonary edema: check daily weights. Continue low salt restrictions. Continue lasix and pironalactone.  8. Recent SBO with lysis of adhesions: will schedule miralax to prevent constipation. Monitor and adjust bowel program as needed.  9. CAD: on coreg, lipitor, and lisinopril . Continue blood thinners for stent 10.  Insomnia  Trial ambien , trazodone not effective   LOS (Days) 1 A FACE TO FACE EVALUATION WAS PERFORMED  Enna Warwick E 05/21/2012, 9:58 AM

## 2012-05-21 NOTE — Evaluation (Signed)
Occupational Therapy Assessment and Plan  Patient Details  Name: Louis Reid MRN: [id number] Date of Birth: December 09, 1932  OT Diagnosis: muscle weakness (generalized) Rehab Potential: Rehab Potential: Good ELOS: 5-7 days   Today's Date: 05/21/2012 Time:  -   0800-0900      Problem List:  Patient Active Problem List  Diagnosis  . HYPERLIPIDEMIA  . HYPERTENSION  . Asymptomatic old MI (myocardial infarction)  . TOBACCO USE, QUIT  . DYSPNEA  . Coronary artery disease  . History of coronary artery bypass surgery  . Odynophagia  . Ventricular fibrillation arrest  . Cardiac arrest  . Acute respiratory failure with hypoxia 2/2 cardiac arrest  . Acute encephalopathy 2/2 cardiac arrest  . Hyperglycemia  . Cardiogenic shock  . Hypotension  . Acute systolic congestive heart failure, NYHA class 3/EF 20-25%  . Anemia of critical illness  . PE (pulmonary embolism)  . Physical deconditioning  . Anorexia  . STEMI (ST elevation myocardial infarction)    Past Medical History:  Past Medical History  Diagnosis Date  . Acute myocardial infarction of anterolateral wall, episode of care unspecified   . Coronary atherosclerosis of artery bypass graft   . Sinoatrial node dysfunction   . HTN (hypertension)   . Depression   . History of kidney stones   . Congenital funnel chest   . Inguinal hernia   . Osteoporosis    Past Surgical History:  Past Surgical History  Procedure Date  . Coronary artery bypass graft 1996  . Inguinal hernia repair 1996    right, hx  . Transurethral resection of prostate   . Ptca     percutaneous transluminal coronary intervention and brachy therapy, Bruce R. Juanda Chance, MD. EF 60%  . Laparotomy 04/27/2012    Procedure: EXPLORATORY LAPAROTOMY;  Surgeon: Cherylynn Ridges, MD;  Location: [facility] OR;  Service: General;  Laterality: N/A;  . Esophagogastroduodenoscopy 05/08/2012    Procedure: ESOPHAGOGASTRODUODENOSCOPY (EGD);  Surgeon: Charna , MD;  Location: [facility]  ENDOSCOPY;  Service: Endoscopy;  Laterality: N/A;  Gunnar Fusi put on for Dr. Loreta Ave , Dr. Loreta Ave will call if she wants another time    Assessment & Plan Clinical Impression:HPI: Louis Reid is a 78 y.o. male with history of CAD, recent admission for SBO with lysis of adhesions, who was admitted on 05/10/12 past witnessed cardiac arrest. Bystander CPR X 10 mins , AED shock X3. Was noted to be in atrial fibrillation and intubated in ED. He was taken to cath lab and underwent PTCA with thrombectomy of occluded LAD by Dr. Clifton James. He developed hematuria/bloody oral secretions with intermittent VT. Hypokalemia supplemented and integrelin discontinued. CT head done due to lethargy and showed no acute changes. Was started on IV antibiotics for leucocytosis. CT chest with tiny PE in lingula and CT abdomen with distended GB with mild ascites and diffuse body wall edema. Started on heparin drip for PE. BLE dopplers negative for DVT. Tolerated extubation 12/08 and encephalopathy slowly resolving. Diet initiated 12/9 due to improvement in mentation and no s/s of aspiration. LifeVest fitted last pm. Patient noted to have difficulty following commands with short term memory deficits as well as balance deficits with impaired mobility. MD, ST recommending CIR. Ultimately pt was admitted today.  .  Patient transferred to CIR on 05/20/2012 .    Patient currently requires mod with basic self-care skills secondary to muscle weakness.  Prior to hospitalization, patient could complete BADLwith no assistance.  Patient will benefit from skilled intervention to increase independence  with basic self-care skills prior to discharge home with care partner.  Anticipate patient will require intermittent supervision and follow up home health.  OT - End of Session Activity Tolerance: Tolerates < 10 min activity, no significant change in vital signs OT Assessment Rehab Potential: Good Barriers to Discharge: Decreased caregiver support OT  Plan OT Intensity: Minimum of 1-2 x/day, 45 to 90 minutes OT Frequency: 5 out of 7 days OT Duration/Estimated Length of Stay: 5-7 days OT Treatment/Interventions: Balance/vestibular training;Cognitive remediation/compensation;Discharge planning;Community reintegration;DME/adaptive equipment instruction;Functional mobility training;Neuromuscular re-education;Pain management;Patient/family education;Psychosocial support;Self Care/advanced ADL retraining;Skin care/wound managment;Therapeutic Activities;Therapeutic Exercise;UE/LE Strength taining/ROM;UE/LE Coordination activities;Wheelchair propulsion/positioning  OT Evaluation Precautions/Restrictions  Precautions Precautions: Fall Precaution Comments:  (scleosis) General   VSpO2: 98 % O2 Device: None (Room air) Pain:  none   Home Living/Prior Functioning Home Living Lives With: Alone Available Help at Discharge: Family;Available PRN/intermittently Type of Home: House Home Access: Stairs to enter Entrance Stairs-Number of Steps: 3-4 Entrance Stairs-Rails: None Home Layout: One level Alternate Level Stairs-Number of Steps: 14 steps Alternate Level Stairs-Rails: Right Bathroom Shower/Tub: Tub/shower unit;Curtain Bathroom Toilet: Standard Bathroom Accessibility: Yes How Accessible: Accessible via walker Home Adaptive Equipment: None IADL History Homemaking Responsibilities: Yes Meal Prep Responsibility: Primary Laundry Responsibility: Primary Cleaning Responsibility: Primary Bill Paying/Finance Responsibility: Primary Shopping Responsibility: Primary Child Care Responsibility: No Current License: Yes Mode of Transportation: Car Occupation: Retired Leisure and Hobbies:  (draws, Education officer, community displays) Prior Function Level of Independence: Independent with homemaking with ambulation;Independent with basic ADLs;Independent with gait;Independent with transfers Able to Take Stairs?: Yes Driving: Yes Vocation: Retired Leisure:  Hobbies-yes (Comment) Comments: loves to paint/art ADL   Vision/Perception  Vision - History Baseline Vision: Wears glasses only for reading Patient Visual Report: No change from baseline Perception Perception: Within Functional Limits Praxis Praxis: Intact  Cognition Orientation Level: Oriented X4 Sensation Sensation Light Touch: Appears Intact Coordination Gross Motor Movements are Fluid and Coordinated: Yes Fine Motor Movements are Fluid and Coordinated: Yes Motor  Motor Motor: Within Functional Limits Mobility  Bed Mobility Bed Mobility: Rolling Right;Supine to Sit Rolling Right: 5: Supervision Supine to Sit: 5: Supervision;With rails;HOB elevated Transfers Sit to Stand: 4: Min assist Sit to Stand Details: Verbal cues for technique  Trunk/Postural Assessment  Cervical Assessment Cervical Assessment:  (forward neck) Thoracic Assessment Thoracic Assessment:  (scleosis) Lumbar Assessment Lumbar Assessment:  (posterior pelvic tilt)  Balance Static Sitting Balance Static Sitting - Balance Support: Feet supported;No upper extremity supported Static Sitting - Level of Assistance: 6: Modified independent (Device/Increase time) Static Standing Balance Static Standing - Balance Support: Right upper extremity supported;During functional activity Static Standing - Level of Assistance: 5: Stand by assistance Dynamic Standing Balance Dynamic Standing - Balance Support: Right upper extremity supported;During functional activity Dynamic Standing - Level of Assistance: 4: Min assist Dynamic Standing - Balance Activities: Forward lean/weight shifting Extremity/Trunk Assessment RUE Assessment RUE Assessment: Within Functional Limits LUE Assessment LUE Assessment: Within Functional Limits  See FIM for current functional status Refer to Care Plan for Long Term Goals  Recommendations for other services: None  Discharge Criteria: Patient will be discharged from OT if patient  refuses treatment 3 consecutive times without medical reason, if treatment goals not met, if there is a change in medical status, if patient makes no progress towards goals or if patient is discharged from hospital.  The above assessment, treatment plan, treatment alternatives and goals were discussed and mutually agreed upon: by patient  Humberto Seals 05/21/2012, 6:28 PM

## 2012-05-21 NOTE — Evaluation (Signed)
Physical Therapy Assessment and Plan  Patient Details  Name: Louis Reid MRN: [id number] Date of Birth: 10-22-1932  PT Diagnosis: Abnormal posture, Difficulty walking, Impaired cognition and Muscle weakness Rehab Potential: good ELOS: 5-10 days  Today's Date: 05/21/2012 Time: 1100-1200 Total Time Calculation: 60 minutes  Problem List:  Patient Active Problem List  Diagnosis  . HYPERLIPIDEMIA  . HYPERTENSION  . Asymptomatic old MI (myocardial infarction)  . TOBACCO USE, QUIT  . DYSPNEA  . Coronary artery disease  . History of coronary artery bypass surgery  . Odynophagia  . Ventricular fibrillation arrest  . Cardiac arrest  . Acute respiratory failure with hypoxia 2/2 cardiac arrest  . Acute encephalopathy 2/2 cardiac arrest  . Hyperglycemia  . Cardiogenic shock  . Hypotension  . Acute systolic congestive heart failure, NYHA class 3/EF 20-25%  . Anemia of critical illness  . PE (pulmonary embolism)  . Physical deconditioning  . Anorexia  . STEMI (ST elevation myocardial infarction)    Past Medical History:  Past Medical History  Diagnosis Date  . Acute myocardial infarction of anterolateral wall, episode of care unspecified   . Coronary atherosclerosis of artery bypass graft   . Sinoatrial node dysfunction   . HTN (hypertension)   . Depression   . History of kidney stones   . Congenital funnel chest   . Inguinal hernia   . Osteoporosis    Past Surgical History:  Past Surgical History  Procedure Date  . Coronary artery bypass graft 1996  . Inguinal hernia repair 1996    right, hx  . Transurethral resection of prostate   . Ptca     percutaneous transluminal coronary intervention and brachy therapy, Bruce R. Juanda Chance, MD. EF 60%  . Laparotomy 04/27/2012    Procedure: EXPLORATORY LAPAROTOMY;  Surgeon: Cherylynn Ridges, MD;  Location: [facility] OR;  Service: General;  Laterality: N/A;  . Esophagogastroduodenoscopy 05/08/2012    Procedure: ESOPHAGOGASTRODUODENOSCOPY  (EGD);  Surgeon: Charna Elizabeth, MD;  Location: [facility] ENDOSCOPY;  Service: Endoscopy;  Laterality: N/A;  Gunnar Fusi put on for Dr. Loreta Ave , Dr. Loreta Ave will call if she wants another time    Assessment & Plan Clinical Impression: Louis Reid is a 78 y.o. male with history of CAD, recent admission for SBO with lysis of adhesions, who was admitted on 05/10/12 past witnessed cardiac arrest. Bystander CPR X 10 mins , AED shock X3. Was noted to be in atrial fibrillation and intubated in ED. He was taken to cath lab and underwent PTCA with thrombectomy of occluded LAD by Dr. Clifton James. He developed hematuria/bloody oral secretions with intermittent VT. Hypokalemia supplemented and integrelin discontinued. CT head done due to lethargy and showed no acute changes. Was started on IV antibiotics for leucocytosis. CT chest with tiny PE in lingula and CT abdomen with distended GB with mild ascites and diffuse body wall edema. Started on heparin drip for PE. BLE dopplers negative for DVT. Tolerated extubation 12/08 and encephalopathy slowly resolving. Diet initiated 12/9 due to improvement in mentation and no s/s of aspiration. LifeVest fitted last pm. Patient noted to have difficulty following commands with short term memory deficits as well as balance deficits with impaired mobility.  Patient transferred to CIR on 05/20/2012 .   Patient currently requires min with mobility secondary to muscle weakness and decreased memory.  Prior to hospitalization, patient was Independent with mobility and lived with Alone in a House home.  Home access is 3 to 4 on front porchStairs to enter.  Patient  will benefit from skilled PT intervention to maximize safe functional mobility, minimize fall risk and decrease caregiver burden for planned discharge home with intermittent assist.  Anticipate patient will benefit from follow up [facility] at discharge.   PT Evaluation Precautions/Restrictions Fall Risk, Short Term Memory deficits  Therapy  Vitals Temp: 98 F (36.7 C) Temp src: Oral Pulse Rate: 79  Resp: 17  BP: 92/54 mmHg Patient Position, if appropriate: Lying Oxygen Therapy SpO2: 98 % O2 Device: None (Room air) Pain: No complaints of pain  Please see Physical Therapy Evaluation in Electronic Record for Motor, Mobility, Locomotion, Trunk/Postural Control, Balance, RLE, LLE, Assessment/Plan.   See FIM for current functional status Refer to Care Plan for Long Term Goals  Recommendations for other services: None  Discharge Criteria: Patient will be discharged from PT if patient refuses treatment 3 consecutive times without medical reason, if treatment goals not met, if there is a change in medical status, if patient makes no progress towards goals or if patient is discharged from hospital.  The above assessment, treatment plan, treatment alternatives and goals were discussed and mutually agreed upon: by patient  Rex Kras 05/21/2012, 3:42 PM

## 2012-05-21 NOTE — Evaluation (Addendum)
Speech Language Pathology Assessment and Plan  Patient Details  Name: Louis Reid MRN: [id number] Date of Birth: [birth year]  SLP Diagnosis: Cognitive Impairments  Rehab Potential: Good ELOS: 5 to 10 days   Today's Date: 05/21/2012 Time:0930  - 1030 (60 minutes, 30 min eval, 30 min cog tx)    Problem List:  Patient Active Problem List  Diagnosis  . HYPERLIPIDEMIA  . HYPERTENSION  . Asymptomatic old MI (myocardial infarction)  . TOBACCO USE, QUIT  . DYSPNEA  . Coronary artery disease  . History of coronary artery bypass surgery  . Odynophagia  . Ventricular fibrillation arrest  . Cardiac arrest  . Acute respiratory failure with hypoxia 2/2 cardiac arrest  . Acute encephalopathy 2/2 cardiac arrest  . Hyperglycemia  . Cardiogenic shock  . Hypotension  . Acute systolic congestive heart failure, NYHA class 3/EF 20-25%  . Anemia of critical illness  . PE (pulmonary embolism)  . Physical deconditioning  . Anorexia  . STEMI (ST elevation myocardial infarction)   Past Medical History:  Past Medical History  Diagnosis Date  . Acute myocardial infarction of anterolateral wall, episode of care unspecified   . Coronary atherosclerosis of artery bypass graft   . Sinoatrial node dysfunction   . HTN (hypertension)   . Depression   . History of kidney stones   . Congenital funnel chest   . Inguinal hernia   . Osteoporosis    Past Surgical History:  Past Surgical History  Procedure Date  . Coronary artery bypass graft 1996  . Inguinal hernia repair 1996    right, hx  . Transurethral resection of prostate   . Ptca     percutaneous transluminal coronary intervention and brachy therapy, Bruce R. Juanda Chance, MD. EF 60%  . Laparotomy 04/27/2012    Procedure: EXPLORATORY LAPAROTOMY;  Surgeon: Cherylynn Ridges, MD;  Location[facility] OR;  Service: General;  Laterality: N/A;  . Esophagogastroduodenoscopy 05/08/2012    Procedure: ESOPHAGOGASTRODUODENOSCOPY (EGD);  Surgeon: Charna Elizabeth, MD;   Location: [facility] ENDOSCOPY;  Service: Endoscopy;  Laterality: N/A;  Gunnar Fusi put on for Dr. Loreta Ave , Dr. Loreta Ave will call if she wants another time    Assessment / Plan / Recommendation Clinical Impression     Louis Reid is a 78 y.o. male with history of CAD, recent admission for SBO with lysis of adhesions, who was admitted on 05/10/12 past witnessed cardiac arrest. Bystander CPR X 10 mins , AED shock X3. Was noted to be in atrial fibrillation and intubated in ED. He was taken to cath lab and underwent PTCA with thrombectomy of occluded LAD by Dr. Clifton James. He developed hematuria/bloody oral secretions with intermittent VT. Hypokalemia supplemented and integrelin discontinued. CT head done due to lethargy and showed no acute changes. Was started on IV antibiotics for leucocytosis. CT chest with tiny PE in lingula and CT abdomen with distended GB with mild ascites and diffuse body wall edema. Started on heparin drip for PE. BLE dopplers negative for DVT. Tolerated extubation 12/08 and encephalopathy slowly resolving. Diet initiated 12/9 due to improvement in mentation and no s/s of aspiration. LifeVest fitted last pm. Patient noted to have difficulty following commands with short term memory deficits as well as balance deficits with impaired mobility.   Pt transferred to CIR on 05/20/12.            Pt currently presents with moderate cognitive deficits in areas of attention, safety awareness, and memory.  Prior to admission, pt managed his home independently.  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  Previous to his work career, pt obtained an associates degree in business administration.   Currently pt requires max cueing to recall new information, frequently repeating the same question.  In addition, improving emergent awareness to memory deficit will maximize his rehabilitation for functional generalization of compensatory strategies.  Pt would benefit from  skilled ST to maximize pt's cognitive function subsequently decreasing caregiver burden when he discharges with 24 hours supervision.          SLP Assessment  Patient will need skilled Speech Lanaguage Pathology Services during CIR admission    Recommendations  Equipment Recommended: None recommended by SLP    SLP Frequency 5 out of 7 days   SLP Treatment/Interventions Cognitive remediation/compensation;Cueing hierarchy;Functional tasks;Patient/family education;Environmental controls;Internal/external aids;Other (comment)    Pain Pain Assessment Pain Assessment: No/denies pain Prior Functioning Education: [employer] degree:  minor in history, major in business administration Vocation: Retired  Teacher, music Term Goals: Week 1: SLP Short Term Goal 1 (Week 1): Pt will follow 3 step commands during functional tasks including home management,  ADLs with min question cues to improve information retention. SLP Short Term Goal 2 (Week 1): Pt will use memory aids (white board, memory book) to recall daily complex information and new information with maximum question cues.  SLP Short Term Goal 3 (Week 1): Pt will demonstrate selective attention in controlled environment for 20 minutes with min question cues.   SLP Short Term Goal 4 (Week 1): Pt will demonstrate emergent awareness in controlled environment to memory deficits with min question and verbal cues.    See FIM for current functional status Refer to Care Plan for Long Term Goals  Recommendations for other services: None  Discharge Criteria: Patient will be discharged from SLP if patient refuses treatment 3 consecutive times without medical reason, if treatment goals not met, if there is a change in medical status, if patient makes no progress towards goals or if patient is discharged from hospital.  The above assessment, treatment plan, treatment alternatives and goals were discussed and mutually agreed upon: by patient  Donavan Burnet, MS [facility]  SLP [phone]   Cognitive treatment 1000-1030:  Purpose of visit to focus on cognitive skill development.  Pt requested his son's phone number, SLP wrote number on paper for pt.  Moderate verbal cues required for pt to create written list of items he needed his son to bring for his CIR stay. Pt used son's written phone number to direct SLP (used SLP phone due to long distance) to leave message with mod I.  When son returned call, pt required Mod I to utilize written list.  Pt used whiteboard information to give his son his room number with mod [place] (SLP wrote in larger print).  Pt currently on bed alarm due to fall risk.    Donavan Burnet, MS [facility] SLP [phone]

## 2012-05-22 ENCOUNTER — Inpatient Hospital Stay (HOSPITAL_COMMUNITY): Payer: Medicare Other

## 2012-05-22 LAB — PROTIME-INR: Prothrombin Time: 24.7 seconds — ABNORMAL HIGH (ref 11.6–15.2)

## 2012-05-22 MED ORDER — WARFARIN SODIUM 2.5 MG PO TABS
2.5000 mg | ORAL_TABLET | Freq: Every day | ORAL | Status: DC
  Administered 2012-05-22 – 2012-05-27 (×6): 2.5 mg via ORAL
  Filled 2012-05-22 (×7): qty 1

## 2012-05-22 NOTE — Progress Notes (Signed)
Occupational Therapy Session Note  Patient Details  Name: Louis Reid MRN: [id number] Date of Birth: 06-17-1932  Today's Date: 05/22/2012 Time: 0900-0945 Time Calculation (min): 45 min Pt denies pain  Short Term Goals: Week 1:  OT Short Term Goal 1 (Week 1): STG= LTG  Skilled Therapeutic Interventions/Progress Updates:    Pt in bed wearing Life Vest upon arrival but agreeable to participating in bathing and dressing tasks w/c level at sink.Pt amb with RW to sink to complete bathing and dressing tasks with sit<>stand from w/c.  Pt required verbal cues to initiation/sequencing throughout session.  Pt required rest breaks throughout session.  Pt left in w/c with Life Vest in place at end of session.  Educated pt on call bell and instructed to call staff with any needs or if he needed to use toilet.   Therapy Documentation Precautions:  Precautions Precautions: Fall Precaution Comments:  (scleosis) Restrictions Weight Bearing Restrictions: No  See FIM for current functional status  Therapy/Group: Individual Therapy  Rich Brave 05/22/2012, 9:47 AM

## 2012-05-22 NOTE — Progress Notes (Signed)
Patient ID: Louis Reid, male   DOB: 1932/10/16, 78 y.o.   MRN: [id number] Subjective/Complaints: 78 y.o. male with history of CAD, recent admission for SBO with lysis of adhesions, who was admitted on 05/10/12 past witnessed cardiac arrest. Bystander CPR X 10 mins , AED shock X3. Was noted to be in atrial fibrillation and intubated in ED. He was taken to cath lab and underwent PTCA with thrombectomy of occluded LAD by Dr. Clifton James. He developed hematuria/bloody oral secretions with intermittent VT. Hypokalemia supplemented and integrelin discontinued. CT head done due to lethargy and showed no acute changes. Was started on IV antibiotics for leucocytosis. CT chest with tiny PE in lingula and CT abdomen with distended GB with mild ascites and diffuse body wall edema. Started on heparin drip for PE. BLE dopplers negative for DVT. Tolerated extubation 12/08 and encephalopathy slowly resolving. Diet initiated 12/9 due to improvement in mentation and no s/s of aspiration. LifeVest fitted  No bowel or bladdder issues Review of Systems  Constitutional: Negative for fever and chills.  Respiratory: Negative for cough and shortness of breath.   Cardiovascular: Negative for chest pain.  Gastrointestinal: Negative for diarrhea and constipation.  All other systems reviewed and are negative.    Objective: Vital Signs: Blood pressure 94/58, pulse 81, temperature 97.9 F (36.6 C), temperature source Oral, resp. rate 18, height 6' (1.829 m), weight 54.3 kg (119 lb 11.4 oz), SpO2 99.00%. No results found. Results for orders placed during the hospital encounter of 05/10/12 (from the past 72 hour(s))  PROTIME-INR     Status: Abnormal   Collection Time   05/20/12  6:05 AM      Component Value Range Comment   Prothrombin Time 25.9 (*) 11.6 - 15.2 seconds    INR 2.51 (*) 0.00 - 1.49   BASIC METABOLIC PANEL     Status: Abnormal   Collection Time   05/20/12  6:05 AM      Component Value Range Comment   Sodium  140  135 - 145 mEq/L    Potassium 3.8  3.5 - 5.1 mEq/L    Chloride 106  96 - 112 mEq/L    CO2 26  19 - 32 mEq/L    Glucose, Bld 104 (*) 70 - 99 mg/dL    BUN 18  6 - 23 mg/dL    Creatinine, Ser 7.82  0.50 - 1.35 mg/dL    Calcium 8.3 (*) 8.4 - 10.5 mg/dL    GFR calc non Af Amer 79 (*) >90 mL/min    GFR calc Af Amer >90  >90 mL/min      HEENT: normal Cardio: RRR Resp: CTA B/L GI: mild distention, healing midline incsion non tender Extremity:  Pulses positive and No Edema Skin:   Wound C/D/I Neuro: Alert/Oriented, Anxious, Normal Sensory and Normal Motor Musc/Skel:  Other Pectus excavatum Gen NAD   Assessment/Plan: 1. Functional deficits secondary to deconditioning , hypoxic encephalopathy resolving which require 3+ hours per day of interdisciplinary therapy in a comprehensive inpatient rehab setting. Physiatrist is providing close team supervision and 24 hour management of active medical problems listed below. Physiatrist and rehab team continue to assess barriers to discharge/monitor patient progress toward functional and medical goals. FIM: FIM - Bathing Bathing Steps Patient Completed: Chest;Right Arm;Left Arm;Abdomen;Front perineal area;Buttocks;Right upper leg;Left upper leg;Left lower leg (including foot);Right lower leg (including foot) Bathing: 5: Supervision: Safety issues/verbal cues  FIM - Upper Body Dressing/Undressing Upper body dressing/undressing steps patient completed: Thread/unthread left sleeve of pullover  shirt/dress;Put head through opening of pull over shirt/dress;Pull shirt over trunk;Thread/unthread left sleeve of front closure shirt/dress;Thread/unthread right sleeve of front closure shirt/dress;Button/unbutton shirt Upper body dressing/undressing: 4: Min-Patient completed 75 plus % of tasks FIM - Lower Body Dressing/Undressing Lower body dressing/undressing steps patient completed: Thread/unthread right underwear leg;Thread/unthread left underwear leg;Pull  underwear up/down;Thread/unthread right pants leg;Thread/unthread left pants leg;Pull pants up/down;Fasten/unfasten pants;Don/Doff right sock;Don/Doff left sock Lower body dressing/undressing: 4: Min-Patient completed 75 plus % of tasks     FIM - Diplomatic Services operational officer Devices: Best boy Transfers: 4-To toilet/BSC: Min A (steadying Pt. > 75%);4-From toilet/BSC: Min A (steadying Pt. > 75%)     FIM - Locomotion: Ambulation Ambulation/Gait Assistance: 4: Min assist  Comprehension Comprehension Mode: Auditory Comprehension: 5-Understands basic 90% of the time/requires cueing < 10% of the time  Expression Expression Mode: Verbal Expression: 5-Expresses basic 90% of the time/requires cueing < 10% of the time.  Social Interaction Social Interaction: 6-Interacts appropriately with others with medication or extra time (anti-anxiety, antidepressant).  Problem Solving Problem Solving: 4-Solves basic 75 - 89% of the time/requires cueing 10 - 24% of the time  Memory Memory: 3-Recognizes or recalls 50 - 74% of the time/requires cueing 25 - 49% of the time  Medical Problem List and Plan:  1.PE: Pharmaceutical: Coumadin  2. Pain Management: N/A  3. Mood: seems to be in good spirits.  4. Neuropsych: This patient is capable of making decisions on his/her own behalf.  5. VF cardiac arrest: LifeVest in place.  6. NSTEMI s/p PTCA: continue ASA, Brilinta and coumadin for a month then ASA and coumadin  7. Pulmonary edema: check daily weights. Continue low salt restrictions. Continue lasix and pironalactone.  8. Recent SBO with lysis of adhesions: will schedule miralax to prevent constipation. Monitor and adjust bowel program as needed.  9. CAD: on coreg, lipitor, and lisinopril . Continue blood thinners for stent 10.  Insomnia  Trial ambien , trazodone not effective   LOS (Days) 2 A FACE TO FACE EVALUATION WAS PERFORMED  KIRSTEINS,ANDREW E 05/22/2012, 9:55  AM

## 2012-05-22 NOTE — Progress Notes (Signed)
ANTICOAGULATION CONSULT NOTE - Follow Up Consult  Pharmacy Consult for Coumadin Indication: pulmonary embolus  Allergies  Allergen Reactions  . Cold Medicine Plus (Chlorphen-Pseudoephed-Apap)     COLD MEDICATIONS MAKE HIM "LOOPY"    Patient Measurements: Height: 6' (182.9 cm) Weight: 119 lb 11.4 oz (54.3 kg) IBW/kg (Calculated) : 77.6  Heparin Dosing Weight: 51.6 kg  Vital Signs: Temp: 97.9 F (36.6 C) (12/15 0545) Temp src: Oral (12/15 0545) BP: 94/58 mmHg (12/15 0545) Pulse Rate: 81  (12/15 0545)  Labs:  Basename 05/22/12 1118 05/20/12 0605  HGB -- --  HCT -- --  PLT -- --  APTT -- --  LABPROT 24.7* 25.9*  INR 2.35* 2.51*  HEPARINUNFRC -- --  CREATININE -- 0.93  CKTOTAL -- --  CKMB -- --  TROPONINI -- --    Estimated Creatinine Clearance: 51.1 ml/min (by C-G formula based on Cr of 0.93).  Assessment: Louis Reid is a 78 year old male s/p heparin to coumadin bridge for PE. INR is therapeutic. No bleeding noted. Also on Aspirin 81 mg and Brilinta 90 mg BID. Noted short course of Brilinta planned; may stop Aspirin while on Coumadin, if Hgb trends down.   Goal of Therapy:  INR 2-3 Heparin level 0.3-0.7 units/ml Monitor platelets by anticoagulation protocol: Yes   Plan:  1) Coumadin 2.5 mg po daily 2) Change INR to M/W/F  Thank you,  Christoper Fabian, PharmD, BCPS Clinical pharmacist, pager [phone] 05/22/2012 12:14 PM

## 2012-05-23 ENCOUNTER — Inpatient Hospital Stay (HOSPITAL_COMMUNITY): Payer: Medicare Other | Admitting: Speech Pathology

## 2012-05-23 ENCOUNTER — Inpatient Hospital Stay (HOSPITAL_COMMUNITY): Payer: Medicare Other | Admitting: Physical Therapy

## 2012-05-23 ENCOUNTER — Inpatient Hospital Stay (HOSPITAL_COMMUNITY): Payer: Medicare Other | Admitting: Occupational Therapy

## 2012-05-23 DIAGNOSIS — R5381 Other malaise: Secondary | ICD-10-CM

## 2012-05-23 DIAGNOSIS — G931 Anoxic brain damage, not elsewhere classified: Secondary | ICD-10-CM

## 2012-05-23 LAB — PROTIME-INR
INR: 2.43 — ABNORMAL HIGH (ref 0.00–1.49)
Prothrombin Time: 25.3 seconds — ABNORMAL HIGH (ref 11.6–15.2)

## 2012-05-23 LAB — CBC WITH DIFFERENTIAL/PLATELET
Basophils Relative: 0 % (ref 0–1)
Eosinophils Absolute: 0.2 10*3/uL (ref 0.0–0.7)
Eosinophils Relative: 2 % (ref 0–5)
Hemoglobin: 9.5 g/dL — ABNORMAL LOW (ref 13.0–17.0)
Lymphs Abs: 1 10*3/uL (ref 0.7–4.0)
MCH: 31.4 pg (ref 26.0–34.0)
MCHC: 32.5 g/dL (ref 30.0–36.0)
MCV: 96.4 fL (ref 78.0–100.0)
Monocytes Relative: 8 % (ref 3–12)
Platelets: 256 10*3/uL (ref 150–400)
RBC: 3.03 MIL/uL — ABNORMAL LOW (ref 4.22–5.81)

## 2012-05-23 LAB — COMPREHENSIVE METABOLIC PANEL
Albumin: 2.5 g/dL — ABNORMAL LOW (ref 3.5–5.2)
BUN: 31 mg/dL — ABNORMAL HIGH (ref 6–23)
Calcium: 8.1 mg/dL — ABNORMAL LOW (ref 8.4–10.5)
GFR calc Af Amer: >90 mL/min (ref >90)
Glucose, Bld: 105 mg/dL — ABNORMAL HIGH (ref 70–99)
Total Protein: 5.5 g/dL — ABNORMAL LOW (ref 6.0–8.3)

## 2012-05-23 MED ORDER — ENSURE COMPLETE PO LIQD
237.0000 mL | Freq: Two times a day (BID) | ORAL | Status: DC
  Administered 2012-05-23 – 2012-05-28 (×8): 237 mL via ORAL

## 2012-05-23 NOTE — Progress Notes (Signed)
Patient ID: Louis Reid, male   DOB: 05/20/1933, 78 y.o.   MRN: [id number] Subjective/Complaints: 78 y.o. male with history of CAD, recent admission for SBO with lysis of adhesions, who was admitted on 05/10/12 past witnessed cardiac arrest. Bystander CPR X 10 mins , AED shock X3. Was noted to be in atrial fibrillation and intubated in ED. He was taken to cath lab and underwent PTCA with thrombectomy of occluded LAD by Dr. Clifton James. He developed hematuria/bloody oral secretions with intermittent VT. Hypokalemia supplemented and integrelin discontinued. CT head done due to lethargy and showed no acute changes. Was started on IV antibiotics for leucocytosis. CT chest with tiny PE in lingula and CT abdomen with distended GB with mild ascites and diffuse body wall edema. Started on heparin drip for PE. BLE dopplers negative for DVT. Tolerated extubation 12/08 and encephalopathy slowly resolving. Diet initiated 12/9 due to improvement in mentation and no s/s of aspiration. LifeVest fitted  Doesn't remember me from yesterday.  Recall conversation regarding team meeting with cues  Review of Systems  Constitutional: Negative for fever and chills.  Respiratory: Negative for cough and shortness of breath.   Cardiovascular: Negative for chest pain.  Gastrointestinal: Negative for diarrhea and constipation.  All other systems reviewed and are negative.    Objective: Vital Signs: Blood pressure 94/60, pulse 82, temperature 98.2 F (36.8 C), temperature source Oral, resp. rate 18, height 6' (1.829 m), weight 54.9 kg (121 lb 0.5 oz), SpO2 95.00%. No results found. Results for orders placed during the hospital encounter of 05/20/12 (from the past 72 hour(s))  PROTIME-INR     Status: Abnormal   Collection Time   05/22/12 11:18 AM      Component Value Range Comment   Prothrombin Time 24.7 (*) 11.6 - 15.2 seconds    INR 2.35 (*) 0.00 - 1.49   CBC WITH DIFFERENTIAL     Status: Abnormal   Collection Time    05/23/12  5:35 AM      Component Value Range Comment   WBC 9.2  4.0 - 10.5 K/uL    RBC 3.03 (*) 4.22 - 5.81 MIL/uL    Hemoglobin 9.5 (*) 13.0 - 17.0 g/dL    HCT 40.9 (*) 81.1 - 52.0 %    MCV 96.4  78.0 - 100.0 fL    MCH 31.4  26.0 - 34.0 pg    MCHC 32.5  30.0 - 36.0 g/dL    RDW 91.4  78.2 - 95.6 %    Platelets 256  150 - 400 K/uL    Neutrophils Relative 79 (*) 43 - 77 %    Neutro Abs 7.2  1.7 - 7.7 K/uL    Lymphocytes Relative 11 (*) 12 - 46 %    Lymphs Abs 1.0  0.7 - 4.0 K/uL    Monocytes Relative 8  3 - 12 %    Monocytes Absolute 0.7  0.1 - 1.0 K/uL    Eosinophils Relative 2  0 - 5 %    Eosinophils Absolute 0.2  0.0 - 0.7 K/uL    Basophils Relative 0  0 - 1 %    Basophils Absolute 0.0  0.0 - 0.1 K/uL   COMPREHENSIVE METABOLIC PANEL     Status: Abnormal   Collection Time   05/23/12  5:35 AM      Component Value Range Comment   Sodium 144  135 - 145 mEq/L    Potassium 4.2  3.5 - 5.1 mEq/L    Chloride  111  96 - 112 mEq/L    CO2 25  19 - 32 mEq/L    Glucose, Bld 105 (*) 70 - 99 mg/dL    BUN 31 (*) 6 - 23 mg/dL    Creatinine, Ser 1.61  0.50 - 1.35 mg/dL    Calcium 8.1 (*) 8.4 - 10.5 mg/dL    Total Protein 5.5 (*) 6.0 - 8.3 g/dL    Albumin 2.5 (*) 3.5 - 5.2 g/dL    AST 28  0 - 37 U/L    ALT 33  0 - 53 U/L    Alkaline Phosphatase 88  39 - 117 U/L    Total Bilirubin 0.5  0.3 - 1.2 mg/dL    GFR calc non Af Amer 82 (*) >90 mL/min    GFR calc Af Amer >90  >90 mL/min   PROTIME-INR     Status: Abnormal   Collection Time   05/23/12  5:35 AM      Component Value Range Comment   Prothrombin Time 25.3 (*) 11.6 - 15.2 seconds    INR 2.43 (*) 0.00 - 1.49      HEENT: normal Cardio: RRR Resp: CTA B/L GI: mild distention, healing midline incsion non tender Extremity:  Pulses positive and No Edema Skin:   Wound C/D/I Neuro: Alert/Oriented, Anxious, Normal Sensory and Normal Motor Musc/Skel:  Other Pectus excavatum Gen NAD   Assessment/Plan: 1. Functional deficits secondary to  deconditioning , hypoxic encephalopathy resolving which require 3+ hours per day of interdisciplinary therapy in a comprehensive inpatient rehab setting. Physiatrist is providing close team supervision and 24 hour management of active medical problems listed below. Physiatrist and rehab team continue to assess barriers to discharge/monitor patient progress toward functional and medical goals. FIM: FIM - Bathing Bathing Steps Patient Completed: Chest;Right Arm;Left Arm;Abdomen;Front perineal area;Buttocks;Right upper leg;Left upper leg;Left lower leg (including foot);Right lower leg (including foot) Bathing: 5: Supervision: Safety issues/verbal cues  FIM - Upper Body Dressing/Undressing Upper body dressing/undressing steps patient completed: Thread/unthread left sleeve of pullover shirt/dress;Put head through opening of pull over shirt/dress;Pull shirt over trunk;Thread/unthread left sleeve of front closure shirt/dress;Thread/unthread right sleeve of front closure shirt/dress;Button/unbutton shirt Upper body dressing/undressing: 4: Min-Patient completed 75 plus % of tasks FIM - Lower Body Dressing/Undressing Lower body dressing/undressing steps patient completed: Thread/unthread right underwear leg;Thread/unthread left underwear leg;Pull underwear up/down;Thread/unthread right pants leg;Thread/unthread left pants leg;Pull pants up/down;Fasten/unfasten pants;Don/Doff right sock;Don/Doff left sock Lower body dressing/undressing: 4: Min-Patient completed 75 plus % of tasks     FIM - Diplomatic Services operational officer Devices: Best boy Transfers: 4-To toilet/BSC: Min A (steadying Pt. > 75%);4-From toilet/BSC: Min A (steadying Pt. > 75%)     FIM - Locomotion: Ambulation Ambulation/Gait Assistance: 4: Min assist  Comprehension Comprehension Mode: Auditory Comprehension: 5-Understands complex 90% of the time/Cues < 10% of the time  Expression Expression Mode:  Verbal Expression: 5-Expresses complex 90% of the time/cues < 10% of the time  Social Interaction Social Interaction: 6-Interacts appropriately with others with medication or extra time (anti-anxiety, antidepressant).  Problem Solving Problem Solving: 5-Solves basic 90% of the time/requires cueing < 10% of the time  Memory Memory: 4-Recognizes or recalls 75 - 89% of the time/requires cueing 10 - 24% of the time  Medical Problem List and Plan:  1.PE: Pharmaceutical: Coumadin  2. Pain Management: N/A  3. Mood: seems to be in good spirits.  4. Neuropsych: This patient is capable of making decisions on his/her own behalf.  5. VF cardiac  arrest: LifeVest in place.  6. NSTEMI s/p PTCA: continue ASA, Brilinta and coumadin for a month then ASA and coumadin  7. Pulmonary edema: check daily weights. Continue low salt restrictions. Continue lasix and pironalactone.  8. Recent SBO with lysis of adhesions: will schedule miralax to prevent constipation. Monitor and adjust bowel program as needed.  9. CAD: on coreg, lipitor, and lisinopril . Continue blood thinners for stent 10.  Insomnia  Trial ambien , trazodone not effective   LOS (Days) 3 A FACE TO FACE EVALUATION WAS PERFORMED  KIRSTEINS,ANDREW E 05/23/2012, 7:53 AM

## 2012-05-23 NOTE — Care Management Note (Signed)
[facility] Individual Statement of Services  Patient Name:  Louis Reid  Date:  05/23/2012  Welcome to the [facility]patient [facility].  Our goal is to provide you with an individualized program based on your diagnosis and situation, designed to meet your specific needs.  With this comprehensive rehabilitation program, you will be expected to participate in at least 3 hours of rehabilitation therapies Monday-Friday, with modified therapy programming on the weekends.  Your rehabilitation program will include the following services:  Physical Therapy (PT), Occupational Therapy (OT), Speech Therapy (ST), 24 hour per day rehabilitation nursing, Therapeutic Recreaction (TR), Case Management (RN and Child psychotherapist), Rehabilitation Medicine, Nutrition Services and Pharmacy Services  Weekly team conferences will be held on Wednesday to discuss your progress.  Your RN Case Designer, television/film set will talk with you frequently to get your input and to update you on team discussions.  Team conferences with you and your family in attendance may also be held.  Expected length of stay: 5-10 days Overall anticipated outcome: mod/i level  Depending on your progress and recovery, your program may change.  Your RN Case Estate agent will coordinate services and will keep you informed of any changes.  Your RN Sports coach and SW names and contact numbers are listed  below.  The following services may also be recommended but are not provided by the Inpatient Rehabilitation Center:   Driving Evaluations  Home Health Rehabiltiation Services  Outpatient Rehabilitatation [facility]  Vocational Rehabilitation   Arrangements will be made to provide these services after discharge if needed.  Arrangements include referral to agencies that provide these services.  Your insurance has been verified to be:  [employer]-Medicare Your primary doctor is:  Dr Windle Guard  Pertinent  information will be shared with your doctor and your insurance company.   Social Worker:  Dossie Der, [place] [phone]  Information discussed with and copy given to patient by: Lucy Chris, 05/23/2012, 11:12 AM

## 2012-05-23 NOTE — Progress Notes (Signed)
INITIAL NUTRITION ASSESSMENT  DOCUMENTATION CODES Per approved criteria  -Severe malnutrition in the context of chronic illness -Underweight    INTERVENTION: Continue Regular diet, encouraged intake, provide preferences Continue Ensure Complete bid, changed schedule  NUTRITION DIAGNOSIS: Malnutrition related to acute and chronic illness as evidenced by decreased body fat and muscle mass.   Goal: Intake of >75% meals and supplements  Monitor:  Intake, labs, weight  Reason for Assessment: Underweight  78 y.o. male  Admitting Dx: Physical deconditioning  ASSESSMENT: Patient known from last admit.  Pt reports that he followed a low fat diet prior to admit.  Likes most food but ofen ate like a bird.  Weight was down to 125# about 7 months ago secondary to depression.  Weight increased with resolution of this but now decreased again with acute/chronic illness.  Patient states that appetite is now good.  Will drink Ensure but afraid of it ruining his appetite     Patient meets criteria for severe malnutrition related to chronic illness AEB decreased body fat and muscle mass, obvious temporal wasting, very thin extremities.  Height: Ht Readings from Last 1 Encounters:  05/20/12 6' (1.829 m)   Weight: Wt Readings from Last 1 Encounters:  05/23/12 121 lb 0.5 oz (54.9 kg)   Ideal Body Weight: 78 kg  % Ideal Body Weight:  70  Wt Readings from Last 10 Encounters:  05/23/12 121 lb 0.5 oz (54.9 kg)  05/19/12 119 lb 11.2 oz (54.296 kg)  05/19/12 119 lb 11.2 oz (54.296 kg)  04/24/12 130 lb (58.968 kg)  04/24/12 130 lb (58.968 kg)  04/24/12 130 lb (58.968 kg)  02/11/12 133 lb (60.328 kg)  01/27/12 133 lb (60.328 kg)  07/09/11 135 lb (61.236 kg)  06/16/10 133 lb (60.328 kg)   Usual Body Weight: 133#  % Usual Body Weight: 91  BMI:  Body mass index is 16.41 kg/(m^2).  Estimated Nutritional Needs: Kcal: 2000-2200 Protein: 80-90 gm protein Fluid: 2-2.2L  Skin: Wound  C/D/I  Diet Order: General  EDUCATION NEEDS: -Education needs addressed-discussed need to increase intake  Intake/Output Summary (Last 24 hours) at 05/23/12 1547 Last data filed at 05/23/12 1300  Gross per 24 hour  Intake    960 ml  Output    375 ml  Net    585 ml    Labs:   Lab 05/23/12 0535 05/20/12 0605 05/18/12 0410  NA 144 140 143  K 4.2 3.8 3.7  CL 111 106 107  CO2 25 26 28   BUN 31* 18 20  CREATININE 0.85 0.93 1.05  CALCIUM 8.1* 8.3* 8.4  MG -- -- --  PHOS -- -- --  GLUCOSE 105* 104* 106*    CBG (last 3)  No results found for this basename: GLUCAP:3 in the last 72 hours  Scheduled Meds:   . antiseptic oral rinse  1 application Mouth Rinse QID  . aspirin  81 mg Oral Daily  . atorvastatin  80 mg Per Tube q1800  . carvedilol  3.125 mg Oral BID WC  . chlorhexidine  15 mL Mouth/Throat BID  . feeding supplement  237 mL Oral BID BM  . furosemide  20 mg Oral Daily  . lisinopril  2.5 mg Oral Daily  . pantoprazole  40 mg Oral QAC breakfast  . polyethylene glycol  17 g Oral Daily  . spironolactone  12.5 mg Oral Daily  . Ticagrelor  90 mg Oral BID  . warfarin  2.5 mg Oral q1800  . Warfarin -  Pharmacist Dosing Inpatient   Does not apply q1800   Continuous Infusions:   Past Medical History  Diagnosis Date  . Acute myocardial infarction of anterolateral wall, episode of care unspecified   . Coronary atherosclerosis of artery bypass graft   . Sinoatrial node dysfunction   . HTN (hypertension)   . Depression   . History of kidney stones   . Congenital funnel chest   . Inguinal hernia   . Osteoporosis     Past Surgical History  Procedure Date  . Coronary artery bypass graft 1996  . Inguinal hernia repair 1996    right, hx  . Transurethral resection of prostate   . Ptca     percutaneous transluminal coronary intervention and brachy therapy, Bruce R. Juanda Chance, MD. EF 60%  . Laparotomy 04/27/2012    Procedure: EXPLORATORY LAPAROTOMY;  Surgeon: Cherylynn Ridges,  MD;  Location: [facility] OR;  Service: General;  Laterality: N/A;  . Esophagogastroduodenoscopy 05/08/2012    Procedure: ESOPHAGOGASTRODUODENOSCOPY (EGD);  Surgeon: Charna Elizabeth, MD;  Location: [facility] ENDOSCOPY;  Service: Endoscopy;  Laterality: N/A;  Gunnar Fusi put on for Dr. Loreta Ave , Dr. Loreta Ave will call if she wants another time    Oran Rein, RD, LDN Clinical Inpatient Dietitian Pager:  [phone] Weekend and after hours pager:  [phone]

## 2012-05-23 NOTE — Progress Notes (Signed)
Social Work Assessment and Plan Social Work Assessment and Plan  Patient Details  Name: Louis Reid MRN: [id number] Date of Birth: [birth year]  Today's Date: 05/23/2012  Problem List:  Patient Active Problem List  Diagnosis  . HYPERLIPIDEMIA  . HYPERTENSION  . Asymptomatic old MI (myocardial infarction)  . TOBACCO USE, QUIT  . DYSPNEA  . Coronary artery disease  . History of coronary artery bypass surgery  . Odynophagia  . Ventricular fibrillation arrest  . Cardiac arrest  . Acute respiratory failure with hypoxia 2/2 cardiac arrest  . Acute encephalopathy 2/2 cardiac arrest  . Hyperglycemia  . Cardiogenic shock  . Hypotension  . Acute systolic congestive heart failure, NYHA class 3/EF 20-25%  . Anemia of critical illness  . PE (pulmonary embolism)  . Physical deconditioning  . Anorexia  . STEMI (ST elevation myocardial infarction)   Past Medical History:  Past Medical History  Diagnosis Date  . Acute myocardial infarction of anterolateral wall, episode of care unspecified   . Coronary atherosclerosis of artery bypass graft   . Sinoatrial node dysfunction   . HTN (hypertension)   . Depression   . History of kidney stones   . Congenital funnel chest   . Inguinal hernia   . Osteoporosis    Past Surgical History:  Past Surgical History  Procedure Date  . Coronary artery bypass graft 1996  . Inguinal hernia repair 1996    right, hx  . Transurethral resection of prostate   . Ptca     percutaneous transluminal coronary intervention and brachy therapy, Bruce R. Juanda Chance, MD. EF 60%  . Laparotomy 04/27/2012    Procedure: EXPLORATORY LAPAROTOMY;  Surgeon: Cherylynn Ridges, MD;  Location: [facility] OR;  Service: General;  Laterality: N/A;  . Esophagogastroduodenoscopy 05/08/2012    Procedure: ESOPHAGOGASTRODUODENOSCOPY (EGD);  Surgeon: Charna Elizabeth, MD;  Location: [facility] ENDOSCOPY;  Service: Endoscopy;  Laterality: N/A;  Gunnar Fusi put on for Dr. Loreta Ave , Dr. Loreta Ave will call if she wants  another time   Social History:  does not have a smoking history on file. He has never used smokeless tobacco. He reports that he does not drink alcohol or use illicit drugs.  Family / Support Systems Marital Status: Widow/Widower Patient Roles: Parent Children: Wallace-son  [phone]  [phone] Anticipated Caregiver: Son have already taken time off, has 1-2 weeks letf off to assist Dad. Ability/Limitations of Caregiver: Son will be there for 1-2 more weeks. Caregiver Availability: 24/7 Family Dynamics: Close with son, Hazeline Junker was there with him when he arrested.  He feels bad he scared him so.  He feels blessed to have his son and be willing to assist him.  Social History Preferred language: English Religion: Christian Cultural Background: No issues Education: High School Read: Yes Write: Yes Employment Status: Retired Fish farm manager Issues: No issues. Guardian/Conservator: None-according to MD pt is capable of making his own decisions   Abuse/Neglect Physical Abuse: Denies Verbal Abuse: Denies Sexual Abuse: Denies Exploitation of patient/patient's resources: Denies Self-Neglect: Denies  Emotional Status Pt's affect, behavior adn adjustment status: Pt is able to explain his conditions, he reprots he is doing welll and feels good about this.  His son is supportive and here daily to visit.  He is looking forward to going home. Recent Psychosocial Issues: Other medical issues-recent admission for a bowel obstruction Pyschiatric History: No history-deferred depression screen due to feels doing well with everything.  He will let us know if he has difficutly, will monitor his coping while  here. Substance Abuse History: No issues  Patient / Family Perceptions, Expectations & Goals Pt/Family understanding of illness & functional limitations: Pt and son are able to explain his condition and deficits.  Huis son has been here daily and aware of his deficits and has spoken  with MD's.  Pt reprots he is getting stronger and wants to go home soon but wnats to be ready. Premorbid pt/family roles/activities: Father, Retiree, Home owner, Church member, etc Anticipated changes in roles/activities/participation: resume Pt/family expectations/goals: Pt states: " I want to be back to independent again, no harm to you all, there is no place like home."  Son states: " I want to be sure he is ready to go home."  [facility] Premorbid Home Care/DME Agencies: None Transportation available at discharge: Son  Discharge Planning Living Arrangements: Alone Support Systems: Children;Friends/neighbors;Church/faith community Type of Residence: Private residence Insurance Resources: Media planner (specify) Investment banker, operational) Financial Resources: Restaurant manager, fast food Screen Referred: No Living Expenses: Own Money Management: Patient Do you have any problems obtaining your medications?: No Home Management: Son Patient/Family Preliminary Plans: Son to stay with pt 1-2 weeks upon discharge.  Son lives in [place] and goes back and forth.  Concerned about dealing with pt's life vest it makes him nervous.  Work on pt's and son's becoming comfortable with life vest prior to discharge. Social Work Anticipated Follow Up Needs: HH/OP DC Planning Additional Notes/Comments: Pt is high functional level, should be short length of stay  Clinical Impression Pleasant gentleman who is motivated to do well here and wants to go home soon.  Supportive son who can be with him for a short time.   Lucy Chris 05/23/2012, 12:17 PM

## 2012-05-23 NOTE — Progress Notes (Addendum)
ANTICOAGULATION CONSULT NOTE - Follow Up Consult  Pharmacy Consult for Coumadin Indication: pulmonary embolus  Allergies  Allergen Reactions  . Cold Medicine Plus (Chlorphen-Pseudoephed-Apap)     COLD MEDICATIONS MAKE HIM "LOOPY"    Patient Measurements: Height: 6' (182.9 cm) Weight: 121 lb 0.5 oz (54.9 kg) IBW/kg (Calculated) : 77.6  Heparin Dosing Weight: 51.6 kg  Vital Signs: Temp: 98.2 F (36.8 C) (12/16 0609) Temp src: Oral (12/16 0609) BP: 104/72 mmHg (12/16 0947) Pulse Rate: 85  (12/16 0947)  Labs:  Basename 05/23/12 0535 05/22/12 1118  HGB 9.5* --  HCT 29.2* --  PLT 256 --  APTT -- --  LABPROT 25.3* 24.7*  INR 2.43* 2.35*  HEPARINUNFRC -- --  CREATININE 0.85 --  CKTOTAL -- --  CKMB -- --  TROPONINI -- --    Estimated Creatinine Clearance: 56.5 ml/min (by C-G formula based on Cr of 0.85).  Assessment: Louis Reid is a 78 year old male s/p heparin to coumadin bridge for PE. INR is therapeutic. No bleeding noted. Also on Aspirin 81 mg and Brilinta 90 mg BID. Noted short course of Brilinta planned; may stop Aspirin while on Coumadin, if Hgb trends down.  H/H stable. No bleeding reported.   Goal of Therapy:  INR 2-3   Plan:  1) Coumadin 2.5 mg po daily 2) continue INR to M/W/F Herby Abraham, Pharm.D. [phone] 05/23/2012 11:04 AM

## 2012-05-23 NOTE — Progress Notes (Signed)
Occupational Therapy Session Note  Patient Details  Name: Louis Reid MRN: [id number] Date of Birth: [birth year]  Today's Date: 05/23/2012 Time: 1030-1130 and 4098-1191 Time Calculation (min): 60 min and 43 min  Short Term Goals: Week 1:  OT Short Term Goal 1 (Week 1): STG= LTG  Skilled Therapeutic Interventions/Progress Updates:    1) Pt seen for ADL retraining with focus on sit <> stand, activity tolerance, and transfers.  Pt completed bathing and dressing at sink at sit <> stand level from w/c with setup assistance.  Pt with increased sequencing of task, asking for verification as to appropriate sequencing.  Pt required rest breaks throughout session.  Engaged in w/c propulsion >150 feet to ADL tub room to simulate tub/shower transfer.  Pt ambulated to tub with RW and stepped over tub ledge with use of grab bar and min/steady assist.  Discussed use of shower chair for energy conservation and increased safety.   2) 1:1 OT with focus on functional mobility in ADL kitchen with and without RW.  Engaged in simple meal prep in sitting and standing with cutting a block of cheese into cubes.  Pt ambulated through kitchen with min/steady assist without RW secondary to crowded environment.  Pt demonstrated sit <> stand with close supervision from low kitchen chair without arm rests.  Required intermittent sitting rest breaks during meal prep task.  Pt with questions throughout task for sequencing despite repetitious nature of task.  Therapy Documentation Precautions:  Precautions Precautions: Fall Precaution Comments:  (scleosis) Restrictions Weight Bearing Restrictions: No General:   Vital Signs: Therapy Vitals Pulse Rate: 85  BP: 104/72 mmHg Patient Position, if appropriate: Sitting Oxygen Therapy SpO2: 100 % O2 Device: None (Room air) Pulse Oximetry Type: Intermittent Pain:   Pt with no c/o pain this session.  See FIM for current functional status  Therapy/Group: Individual  Therapy  [name] 05/23/2012, 12:10 PM

## 2012-05-23 NOTE — Progress Notes (Signed)
Physical Therapy Session Note  Patient Details  Name: Louis Reid MRN: [id number] Date of Birth: 05-16-1933  Today's Date: 05/23/2012 Time: 0905-1005 Time Calculation (min): 60 min  Short Term Goals: Week 1:  PT Short Term Goal 1 (Week 1): STG's = LTG's  Skilled Therapeutic Interventions/Progress Updates:   Discussed with patient level of functional PTA, home set up, equipment and assistance available after D/C.  Patient performed bed mobility supine > sit with supervision and stand pivot bed > w/c <> toilet with min HHA; stood at sink to wash hands with min A for balance.  Vitals assessed in gym.  Berg balance assessed see below for details; discussed with patient falls risk and safety risk secondary to memory impairments.  Returned to room to rest prior to B&D with call bell and phone within reach.    Therapy Documentation Precautions:  Precautions Precautions: Fall Precaution Comments:  (scleosis) Restrictions Weight Bearing Restrictions: No Vital Signs: Therapy Vitals Temp: 98.2 F (36.8 C) Temp src: Oral Pulse Rate: 85  Resp: 18  BP: 104/72 mmHg Patient Position, if appropriate: Sitting Oxygen Therapy SpO2: 100 % O2 Device: None (Room air) Pulse Oximetry Type: Intermittent Pain:  No c/o pain Locomotion : Ambulation Ambulation/Gait Assistance: 4: Min assist  Balance: Standardized Balance Assessment Standardized Balance Assessment: Berg Balance Test Berg Balance Test Sit to Stand: Able to stand  independently using hands Standing Unsupported: Able to stand safely 2 minutes Sitting with Back Unsupported but Feet Supported on Floor or Stool: Able to sit safely and securely 2 minutes Stand to Sit: Controls descent by using hands Transfers: Able to transfer safely, minor use of hands Standing Unsupported with Eyes Closed: Able to stand 10 seconds with supervision Standing Ubsupported with Feet Together: Able to place feet together independently and stand for 1  minute with supervision From Standing, Reach Forward with Outstretched Arm: Can reach forward >12 cm safely (5") From Standing Position, Pick up Object from Floor: Able to pick up shoe safely and easily From Standing Position, Turn to Look Behind Over each Shoulder: Looks behind one side only/other side shows less weight shift Turn 360 Degrees: Needs close supervision or verbal cueing Standing Unsupported, Alternately Place Feet on Step/Stool: Able to complete 4 steps without aid or supervision Standing Unsupported, One Foot in Front: Able to take small step independently and hold 30 seconds Standing on One Leg: Tries to lift leg/unable to hold 3 seconds but remains standing independently Total Score: 40  Patient demonstrates increased fall risk as noted by score of 40/56 on Berg Balance Scale.  (<36= high risk for falls, close to 100%; 37-45 significant >80%; 46-51 moderate >50%; 52-55 lower >25%).  See FIM for current functional status  Therapy/Group: Individual Therapy  [name] Faucette 05/23/2012, 10:02 AM

## 2012-05-23 NOTE — Progress Notes (Signed)
Patient information reviewed and entered into eRehab system by Mattie Nordell, RN, CRRN, PPS Coordinator.  Information including medical coding and functional independence measure will be reviewed and updated through discharge.     Per nursing patient was given "Data Collection Information Summary for Patients in Inpatient Rehabilitation Facilities with attached "Privacy Act Statement-Health Care Records" upon admission.  

## 2012-05-23 NOTE — Progress Notes (Signed)
Speech Language Pathology Daily Session Note  Patient Details  Name: Louis Reid MRN: [id number] Date of Birth: 12-18-1932  Today's Date: 05/23/2012 Time: 4098-1191 Time Calculation (min): 30 min  Short Term Goals: Week 1: SLP Short Term Goal 1 (Week 1): Pt will follow 3 step commands during functional tasks including home management,  ADLs with min question cues to improve information retention. SLP Short Term Goal 2 (Week 1): Pt will use memory aids (white board, memory book) to recall daily complex information and new information with maximum question cues.  SLP Short Term Goal 3 (Week 1): Pt will demonstrate selective attention in controlled environment for 20 minutes with min question cues.   SLP Short Term Goal 4 (Week 1): Pt will demonstrate emergent awareness in controlled environment to memory deficits with min question and verbal cues.    Skilled Therapeutic Interventions: Skilled treatment session targeted cognition goals.  SLP facilitated session with discussion regarding current deficits as well as education regarding strategies to compensate for recall difficulties.  Family (nieces) present for session and verbalized understanding of information.  Patient continues to require min assist cues to initiate use of and organize notes.    FIM:  Comprehension Comprehension Mode: Auditory Comprehension: 5-Understands complex 90% of the time/Cues < 10% of the time Expression Expression Mode: Verbal Expression: 6-Expresses complex ideas: With extra time/assistive device Social Interaction Social Interaction: 6-Interacts appropriately with others with medication or extra time (anti-anxiety, antidepressant). Problem Solving Problem Solving: 5-Solves basic 90% of the time/requires cueing < 10% of the time Memory Memory: 4-Recognizes or recalls 75 - 89% of the time/requires cueing 10 - 24% of the time FIM - Eating Eating Activity: 7: Complete independence:no helper  Pain Pain  Assessment Pain Assessment: No/denies pain  Therapy/Group: Individual Therapy  Charlane Ferretti., CCC-SLP [phone]  Louis Reid 05/23/2012, 4:58 PM

## 2012-05-24 ENCOUNTER — Inpatient Hospital Stay (HOSPITAL_COMMUNITY): Payer: Medicare Other | Admitting: Speech Pathology

## 2012-05-24 ENCOUNTER — Inpatient Hospital Stay (HOSPITAL_COMMUNITY): Payer: Medicare Other | Admitting: Occupational Therapy

## 2012-05-24 ENCOUNTER — Inpatient Hospital Stay (HOSPITAL_COMMUNITY): Payer: Medicare Other | Admitting: Physical Therapy

## 2012-05-24 LAB — URINALYSIS, ROUTINE W REFLEX MICROSCOPIC
Bilirubin Urine: NEGATIVE
Glucose, UA: NEGATIVE mg/dL
Nitrite: NEGATIVE
Specific Gravity, Urine: 1.02 (ref 1.005–1.030)
pH: 5 (ref 5.0–8.0)

## 2012-05-24 LAB — URINE MICROSCOPIC-ADD ON

## 2012-05-24 LAB — CBC
HCT: 28.9 % — ABNORMAL LOW (ref 39.0–52.0)
Hemoglobin: 9.3 g/dL — ABNORMAL LOW (ref 13.0–17.0)
MCV: 97 fL (ref 78.0–100.0)
Platelets: 241 10*3/uL (ref 150–400)
RBC: 2.98 MIL/uL — ABNORMAL LOW (ref 4.22–5.81)
WBC: 7.5 10*3/uL (ref 4.0–10.5)

## 2012-05-24 NOTE — Progress Notes (Signed)
Physical Therapy Session Note  Patient Details  Name: Louis Reid MRN: [id number] Date of Birth: [birth year]  Today's Date: 05/24/2012 Time: 0900-1000 Time Calculation (min): 60 min  Short Term Goals: Week 1:  PT Short Term Goal 1 (Week 1): STG's = LTG's  Skilled Therapeutic Interventions/Progress Updates:   Patient reports not sleeping well last night and staying up late to paint to help him relax; patient performed supine > sit EOB with supervision; performed bed > toilet transfer with min HHA but performed all toileting tasks with supervision; transfer toilet > stand at sink to wash hands and then > chair with min A.  Vitals assessed; see below.  Performed gait in controlled environment with out use of AD but with min A x 150' x 2 reps with tactile and verbal cues to maintain balance secondary to posterior and R lateral lean and for increased step/stride length and upright gaze.  Patient performed stair negotiation up and down 5 stairs x 2 reps with bilat rails and supervision with alternating sequence.  Performed standing dynamic balance and LE strengthening exercises: one set x 10 reps each: heel raises, toe raises, hip flexion marches, open chain hip ABD with bilat >> unilat UE support and min A with multiple sitting rest breaks secondary to fatigue.    Therapy Documentation Precautions:  Precautions Precautions: Fall Precaution Comments: Life Vest Restrictions Weight Bearing Restrictions: No Vital Signs: Therapy Vitals Pulse Rate: 92  BP: 106/69 mmHg Patient Position, if appropriate: Sitting Oxygen Therapy SpO2: 99 % O2 Device: None (Room air) Pulse Oximetry Type: Intermittent Pain: Pain Assessment Pain Assessment: No/denies pain Locomotion : Ambulation Ambulation/Gait Assistance: 4: Min assist   See FIM for current functional status  Therapy/Group: Individual Therapy  [name] Faucette 05/24/2012, 10:30 AM

## 2012-05-24 NOTE — Progress Notes (Signed)
Patient ID: Louis Reid, male   DOB: 1933-01-22, 78 y.o.   MRN: [id number] Subjective/Complaints: 78 y.o. male with history of CAD, recent admission for SBO with lysis of adhesions, who was admitted on 05/10/12 past witnessed cardiac arrest. Bystander CPR X 10 mins , AED shock X3. Was noted to be in atrial fibrillation and intubated in ED. He was taken to cath lab and underwent PTCA with thrombectomy of occluded LAD by Dr. Clifton James. He developed hematuria/bloody oral secretions with intermittent VT. Hypokalemia supplemented and integrelin discontinued. CT head done due to lethargy and showed no acute changes. Was started on IV antibiotics for leucocytosis. CT chest with tiny PE in lingula and CT abdomen with distended GB with mild ascites and diffuse body wall edema. Started on heparin drip for PE. BLE dopplers negative for DVT. Tolerated extubation 12/08 and encephalopathy slowly resolving. Diet initiated 12/9 due to improvement in mentation and no s/s of aspiration. LifeVest fitted  Doesn't remember me from yesterday.  Recall conversation regarding team meeting with cues  Review of Systems  Constitutional: Negative for fever and chills.  Respiratory: Negative for cough and shortness of breath.   Cardiovascular: Negative for chest pain.  Gastrointestinal: Negative for diarrhea and constipation.  All other systems reviewed and are negative.    Objective: Vital Signs: Blood pressure 98/63, pulse 81, temperature 97.6 F (36.4 C), temperature source Oral, resp. rate 19, height 6' (1.829 m), weight 54.8 kg (120 lb 13 oz), SpO2 98.00%. No results found. Results for orders placed during the hospital encounter of 05/20/12 (from the past 72 hour(s))  PROTIME-INR     Status: Abnormal   Collection Time   05/22/12 11:18 AM      Component Value Range Comment   Prothrombin Time 24.7 (*) 11.6 - 15.2 seconds    INR 2.35 (*) 0.00 - 1.49   CBC WITH DIFFERENTIAL     Status: Abnormal   Collection Time   05/23/12  5:35 AM      Component Value Range Comment   WBC 9.2  4.0 - 10.5 K/uL    RBC 3.03 (*) 4.22 - 5.81 MIL/uL    Hemoglobin 9.5 (*) 13.0 - 17.0 g/dL    HCT 08.6 (*) 57.8 - 52.0 %    MCV 96.4  78.0 - 100.0 fL    MCH 31.4  26.0 - 34.0 pg    MCHC 32.5  30.0 - 36.0 g/dL    RDW 46.9  62.9 - 52.8 %    Platelets 256  150 - 400 K/uL    Neutrophils Relative 79 (*) 43 - 77 %    Neutro Abs 7.2  1.7 - 7.7 K/uL    Lymphocytes Relative 11 (*) 12 - 46 %    Lymphs Abs 1.0  0.7 - 4.0 K/uL    Monocytes Relative 8  3 - 12 %    Monocytes Absolute 0.7  0.1 - 1.0 K/uL    Eosinophils Relative 2  0 - 5 %    Eosinophils Absolute 0.2  0.0 - 0.7 K/uL    Basophils Relative 0  0 - 1 %    Basophils Absolute 0.0  0.0 - 0.1 K/uL   COMPREHENSIVE METABOLIC PANEL     Status: Abnormal   Collection Time   05/23/12  5:35 AM      Component Value Range Comment   Sodium 144  135 - 145 mEq/L    Potassium 4.2  3.5 - 5.1 mEq/L    Chloride 111  96 - 112 mEq/L    CO2 25  19 - 32 mEq/L    Glucose, Bld 105 (*) 70 - 99 mg/dL    BUN 31 (*) 6 - 23 mg/dL    Creatinine, Ser 8.65  0.50 - 1.35 mg/dL    Calcium 8.1 (*) 8.4 - 10.5 mg/dL    Total Protein 5.5 (*) 6.0 - 8.3 g/dL    Albumin 2.5 (*) 3.5 - 5.2 g/dL    AST 28  0 - 37 U/L    ALT 33  0 - 53 U/L    Alkaline Phosphatase 88  39 - 117 U/L    Total Bilirubin 0.5  0.3 - 1.2 mg/dL    GFR calc non Af Amer 82 (*) >90 mL/min    GFR calc Af Amer >90  >90 mL/min   PROTIME-INR     Status: Abnormal   Collection Time   05/23/12  5:35 AM      Component Value Range Comment   Prothrombin Time 25.3 (*) 11.6 - 15.2 seconds    INR 2.43 (*) 0.00 - 1.49   CBC     Status: Abnormal   Collection Time   05/24/12  6:30 AM      Component Value Range Comment   WBC 7.5  4.0 - 10.5 K/uL    RBC 2.98 (*) 4.22 - 5.81 MIL/uL    Hemoglobin 9.3 (*) 13.0 - 17.0 g/dL    HCT 78.4 (*) 69.6 - 52.0 %    MCV 97.0  78.0 - 100.0 fL    MCH 31.2  26.0 - 34.0 pg    MCHC 32.2  30.0 - 36.0 g/dL    RDW  29.5  28.4 - 13.2 %    Platelets 241  150 - 400 K/uL      HEENT: normal Cardio: RRR Resp: CTA B/L GI: mild distention, healing midline incsion non tender Extremity:  Pulses positive and No Edema Skin:   Wound C/D/I Neuro: Alert/Oriented, Anxious, Normal Sensory and Normal Motor Musc/Skel:  Other Pectus excavatum Gen NAD GU:  Dried blood meatus no tenderness, no edema, no erythema   Assessment/Plan: 1. Functional deficits secondary to deconditioning , hypoxic encephalopathy resolving which require 3+ hours per day of interdisciplinary therapy in a comprehensive inpatient rehab setting. Physiatrist is providing close team supervision and 24 hour management of active medical problems listed below. Physiatrist and rehab team continue to assess barriers to discharge/monitor patient progress toward functional and medical goals. FIM: FIM - Bathing Bathing Steps Patient Completed: Chest;Right Arm;Left Arm;Abdomen;Front perineal area;Buttocks;Right upper leg;Left upper leg Bathing: 4: Min-Patient completes 8-9 14f 10 parts or 75+ percent  FIM - Upper Body Dressing/Undressing Upper body dressing/undressing steps patient completed: Thread/unthread right sleeve of pullover shirt/dresss;Thread/unthread left sleeve of pullover shirt/dress;Put head through opening of pull over shirt/dress;Pull shirt over trunk Upper body dressing/undressing: 5: Set-up assist to: Obtain clothing/put away FIM - Lower Body Dressing/Undressing Lower body dressing/undressing steps patient completed: Thread/unthread right underwear leg;Thread/unthread left underwear leg;Pull underwear up/down;Thread/unthread right pants leg;Thread/unthread left pants leg;Pull pants up/down;Fasten/unfasten pants;Don/Doff right sock;Don/Doff left sock Lower body dressing/undressing: 4: Min-Patient completed 75 plus % of tasks  FIM - Toileting Toileting steps completed by patient: Adjust clothing prior to toileting;Performs perineal  hygiene;Adjust clothing after toileting Toileting: 4: Steadying assist  FIM - Diplomatic Services operational officer Devices: Grab bars Toilet Transfers: 4-To toilet/BSC: Min A (steadying Pt. > 75%);4-From toilet/BSC: Min A (steadying Pt. > 75%)  FIM - Bed/Chair Transfer Bed/Chair Transfer:  5: Supine > Sit: Supervision (verbal cues/safety issues);4: Bed > Chair or W/C: Min A (steadying Pt. > 75%);4: Chair or W/C > Bed: Min A (steadying Pt. > 75%)  FIM - Locomotion: Ambulation Locomotion: Ambulation Assistive Devices: Other (comment) (HHA) Ambulation/Gait Assistance: 4: Min assist  Comprehension Comprehension Mode: Auditory Comprehension: 5-Understands complex 90% of the time/Cues < 10% of the time  Expression Expression Mode: Verbal Expression: 6-Expresses complex ideas: With extra time/assistive device  Social Interaction Social Interaction: 6-Interacts appropriately with others with medication or extra time (anti-anxiety, antidepressant).  Problem Solving Problem Solving: 5-Solves basic 90% of the time/requires cueing < 10% of the time  Memory Memory: 4-Recognizes or recalls 75 - 89% of the time/requires cueing 10 - 24% of the time  Medical Problem List and Plan:  1.PE: Pharmaceutical: Coumadin  2. Pain Management: N/A  3. Mood: seems to be in good spirits.  4. Neuropsych: This patient is capable of making decisions on his/her own behalf.  5. VF cardiac arrest: LifeVest in place.  6. NSTEMI s/p PTCA: continue ASA, Brilinta and coumadin for a month then ASA and coumadin  7. Pulmonary edema: check daily weights. Continue low salt restrictions. Continue lasix and pironalactone.  8. Recent SBO with lysis of adhesions: will schedule miralax to prevent constipation. Monitor and adjust bowel program as needed.  9. CAD: on coreg, lipitor, and lisinopril . Continue blood thinners for stent 10.  Insomnia  Trial ambien , trazodone not effective 11.  Meatal bleeding check UA, HgB  stable monitor for recurrence, on 2 antiplatelet agents plus warfarin, may need to monitor this regimen if recurrent bleeding occurs  LOS (Days) 4 A FACE TO FACE EVALUATION WAS PERFORMED  KIRSTEINS,ANDREW E 05/24/2012, 8:17 AM

## 2012-05-24 NOTE — Progress Notes (Signed)
Occupational Therapy Session Note  Patient Details  Name: HENCE DERRICK MRN: [id number] Date of Birth: November 14, 1932  Today's Date: 05/24/2012 Time: 0800-0830 and 1100-1130 Time Calculation (min): 30 min and 30 min  Short Term Goals: Week 1:  OT Short Term Goal 1 (Week 1): STG= LTG  Skilled Therapeutic Interventions/Progress Updates:    1) Pt seen for ADL retraining with focus on bed mobility, ambulation with RW, and standing tolerance during grooming task.  Pt missed 30 mins secondary to requesting to complete breakfast prior to completing therapy session.  Pt completed bed mobility and ambulation to sink with supervision and cues for hand placement prior to sit to stand.  Pt maintained upright standing without any LOB or fatigue to complete grooming at sink.  Pt reports bathing prior to this session with RN secondary to some bleeding from penis and reports no need to bathe again.  Also no clothes this session, requested pt to have son bring in some more clothes as all dirty ones have been taken home.  2) 1:1 OT with focus on functional mobility without AD and standing tolerance in kitchen with simple meal prep.  Pt gathered items from cabinets and refrigerator to bake cookies.  Pt required verbal cues for sequencing as pt would frequently ask what to do next and "is this right?"  Close supervision with ambulation when obtaining items, however distant supervision with preparation in standing and washing dishes.  Pt with no seated rest breaks.  Returned to room to sit up in bed until lunch, bed alarm on and call bell in reach.  Therapy Documentation Precautions:  Precautions Precautions: Fall Precaution Comments:  (scleosis) Restrictions Weight Bearing Restrictions: No General: General Amount of Missed OT Time (min): 30 Minutes Vital Signs: Therapy Vitals Pulse Rate: 90  BP: 105/73 mmHg Pain:  Pt with no c/o pain this session.  See FIM for current functional status  Therapy/Group:  Individual Therapy  [name] 05/24/2012, 9:09 AM

## 2012-05-24 NOTE — Progress Notes (Signed)
Speech Language Pathology Daily Session Note  Patient Details  Name: Louis Reid MRN: [id number] Date of Birth: September 01, 1932  Today's Date: 05/24/2012 Time: 1005-1050 Time Calculation (min): 45 min  Short Term Goals: Week 1: SLP Short Term Goal 1 (Week 1): Pt will follow 3 step commands during functional tasks including home management,  ADLs with min question cues to improve information retention. SLP Short Term Goal 2 (Week 1): Pt will use memory aids (white board, memory book) to recall daily complex information and new information with maximum question cues.  SLP Short Term Goal 3 (Week 1): Pt will demonstrate selective attention in controlled environment for 20 minutes with min question cues.   SLP Short Term Goal 4 (Week 1): Pt will demonstrate emergent awareness in controlled environment to memory deficits with min question and verbal cues.    Skilled Therapeutic Interventions: Skilled treatment session focused on addressing cognitive goals during various self-care tasks.  SLP facilitated session by requesting patient locate information that he took notes on during yesterday's session; patient required mod assist cues to locate notebook and then information.  SLP educated patient on importance of organizational strategies to support use of memory compensatory aids.   SLP generated a new folder with note organized by day and a medication chart to facilitate increased recall of daily information and new medications.   Will continue to follow for carryover.     FIM:  Comprehension Comprehension Mode: Auditory Comprehension: 5-Understands complex 90% of the time/Cues < 10% of the time Expression Expression Mode: Verbal Expression: 6-Expresses complex ideas: With extra time/assistive device Social Interaction Social Interaction: 6-Interacts appropriately with others with medication or extra time (anti-anxiety, antidepressant). Problem Solving Problem Solving: 4-Solves basic 75 - 89% of  the time/requires cueing 10 - 24% of the time Memory Memory: 4-Recognizes or recalls 75 - 89% of the time/requires cueing 10 - 24% of the time  Pain Pain Assessment Pain Assessment: No/denies pain  Therapy/Group: Individual Therapy  Charlane Ferretti., CCC-SLP [phone]  Louis Reid 05/24/2012, 1:39 PM

## 2012-05-25 ENCOUNTER — Inpatient Hospital Stay (HOSPITAL_COMMUNITY): Payer: Medicare Other | Admitting: Speech Pathology

## 2012-05-25 ENCOUNTER — Inpatient Hospital Stay (HOSPITAL_COMMUNITY): Payer: Medicare Other | Admitting: Occupational Therapy

## 2012-05-25 ENCOUNTER — Inpatient Hospital Stay (HOSPITAL_COMMUNITY): Payer: Medicare Other | Admitting: Physical Therapy

## 2012-05-25 LAB — PROTIME-INR: Prothrombin Time: 26.1 seconds — ABNORMAL HIGH (ref 11.6–15.2)

## 2012-05-25 NOTE — Progress Notes (Signed)
ANTICOAGULATION CONSULT NOTE - Follow Up Consult  Pharmacy Consult for Coumadin Indication: pulmonary embolus  Allergies  Allergen Reactions  . Cold Medicine Plus (Chlorphen-Pseudoephed-Apap)     COLD MEDICATIONS MAKE HIM "LOOPY"    Patient Measurements: Height: 6' (182.9 cm) Weight: 119 lb 4.3 oz (54.1 kg) IBW/kg (Calculated) : 77.6   Vital Signs: Temp: 98.4 F (36.9 C) (12/18 0821) Temp src: Oral (12/18 0821) BP: 119/83 mmHg (12/18 0821) Pulse Rate: 88  (12/18 0821)  Labs:  Basename 05/25/12 0607 05/24/12 0630 05/23/12 0535  HGB -- 9.3* 9.5*  HCT -- 28.9* 29.2*  PLT -- 241 256  APTT -- -- --  LABPROT 26.1* -- 25.3*  INR 2.54* -- 2.43*  HEPARINUNFRC -- -- --  CREATININE -- -- 0.85  CKTOTAL -- -- --  CKMB -- -- --  TROPONINI -- -- --    Estimated Creatinine Clearance: 55.7 ml/min (by C-G formula based on Cr of 0.85).  Assessment: Louis Reid is a 78 year old male s/p heparin to coumadin bridge for PE. INR continues to be therapeutic. No bleeding noted. Also on Aspirin 81 mg and Brilinta 90 mg BID. Noted short course of Brilinta planned; may stop Aspirin while on Coumadin, if Hgb trends down.  H/H stable. Some meatal bleeding reported 12/7 - will follow.   Goal of Therapy:  INR 2-3   Plan:  1) Coumadin 2.5 mg po daily 2) continue INR  M/W/F  Christoper Fabian, PharmD, BCPS Clinical pharmacist, pager [phone] 05/25/2012 12:55 PM

## 2012-05-25 NOTE — Progress Notes (Signed)
Patient ID: Louis Reid, male   DOB: Apr 20, 1933, 78 y.o.   MRN: [id number] Subjective/Complaints: 78 y.o. male with history of CAD, recent admission for SBO with lysis of adhesions, who was admitted on 05/10/12 past witnessed cardiac arrest. Bystander CPR X 10 mins , AED shock X3. Was noted to be in atrial fibrillation and intubated in ED. He was taken to cath lab and underwent PTCA with thrombectomy of occluded LAD by Dr. Clifton James. He developed hematuria/bloody oral secretions with intermittent VT. Hypokalemia supplemented and integrelin discontinued. CT head done due to lethargy and showed no acute changes. Was started on IV antibiotics for leucocytosis. CT chest with tiny PE in lingula and CT abdomen with distended GB with mild ascites and diffuse body wall edema. Started on heparin drip for PE. BLE dopplers negative for DVT. Tolerated extubation 12/08 and encephalopathy slowly resolving. Diet initiated 12/9 due to improvement in mentation and no s/s of aspiration. LifeVest fitted  Remembers me, PT and RN note some confusion Pt recognizes some confusion Alert and Oriented x 3  Review of Systems  Constitutional: Negative for fever and chills.  Respiratory: Negative for cough and shortness of breath.   Cardiovascular: Negative for chest pain.  Gastrointestinal: Negative for diarrhea and constipation.  All other systems reviewed and are negative.    Objective: Vital Signs: Blood pressure 119/83, pulse 88, temperature 98.4 F (36.9 C), temperature source Oral, resp. rate 18, height 6' (1.829 m), weight 54.1 kg (119 lb 4.3 oz), SpO2 99.00%. No results found. Results for orders placed during the hospital encounter of 05/20/12 (from the past 72 hour(s))  PROTIME-INR     Status: Abnormal   Collection Time   05/22/12 11:18 AM      Component Value Range Comment   Prothrombin Time 24.7 (*) 11.6 - 15.2 seconds    INR 2.35 (*) 0.00 - 1.49   CBC WITH DIFFERENTIAL     Status: Abnormal   Collection  Time   05/23/12  5:35 AM      Component Value Range Comment   WBC 9.2  4.0 - 10.5 K/uL    RBC 3.03 (*) 4.22 - 5.81 MIL/uL    Hemoglobin 9.5 (*) 13.0 - 17.0 g/dL    HCT 40.9 (*) 81.1 - 52.0 %    MCV 96.4  78.0 - 100.0 fL    MCH 31.4  26.0 - 34.0 pg    MCHC 32.5  30.0 - 36.0 g/dL    RDW 91.4  78.2 - 95.6 %    Platelets 256  150 - 400 K/uL    Neutrophils Relative 79 (*) 43 - 77 %    Neutro Abs 7.2  1.7 - 7.7 K/uL    Lymphocytes Relative 11 (*) 12 - 46 %    Lymphs Abs 1.0  0.7 - 4.0 K/uL    Monocytes Relative 8  3 - 12 %    Monocytes Absolute 0.7  0.1 - 1.0 K/uL    Eosinophils Relative 2  0 - 5 %    Eosinophils Absolute 0.2  0.0 - 0.7 K/uL    Basophils Relative 0  0 - 1 %    Basophils Absolute 0.0  0.0 - 0.1 K/uL   COMPREHENSIVE METABOLIC PANEL     Status: Abnormal   Collection Time   05/23/12  5:35 AM      Component Value Range Comment   Sodium 144  135 - 145 mEq/L    Potassium 4.2  3.5 - 5.1 mEq/L  Chloride 111  96 - 112 mEq/L    CO2 25  19 - 32 mEq/L    Glucose, Bld 105 (*) 70 - 99 mg/dL    BUN 31 (*) 6 - 23 mg/dL    Creatinine, Ser 7.25  0.50 - 1.35 mg/dL    Calcium 8.1 (*) 8.4 - 10.5 mg/dL    Total Protein 5.5 (*) 6.0 - 8.3 g/dL    Albumin 2.5 (*) 3.5 - 5.2 g/dL    AST 28  0 - 37 U/L    ALT 33  0 - 53 U/L    Alkaline Phosphatase 88  39 - 117 U/L    Total Bilirubin 0.5  0.3 - 1.2 mg/dL    GFR calc non Af Amer 82 (*) >90 mL/min    GFR calc Af Amer >90  >90 mL/min   PROTIME-INR     Status: Abnormal   Collection Time   05/23/12  5:35 AM      Component Value Range Comment   Prothrombin Time 25.3 (*) 11.6 - 15.2 seconds    INR 2.43 (*) 0.00 - 1.49   CBC     Status: Abnormal   Collection Time   05/24/12  6:30 AM      Component Value Range Comment   WBC 7.5  4.0 - 10.5 K/uL    RBC 2.98 (*) 4.22 - 5.81 MIL/uL    Hemoglobin 9.3 (*) 13.0 - 17.0 g/dL    HCT 36.6 (*) 44.0 - 52.0 %    MCV 97.0  78.0 - 100.0 fL    MCH 31.2  26.0 - 34.0 pg    MCHC 32.2  30.0 - 36.0 g/dL     RDW 34.7  42.5 - 95.6 %    Platelets 241  150 - 400 K/uL   URINALYSIS, ROUTINE W REFLEX MICROSCOPIC     Status: Abnormal   Collection Time   05/24/12  1:30 PM      Component Value Range Comment   Color, Urine YELLOW  YELLOW    APPearance CLEAR  CLEAR    Specific Gravity, Urine 1.020  1.005 - 1.030    pH 5.0  5.0 - 8.0    Glucose, UA NEGATIVE  NEGATIVE mg/dL    Hgb urine dipstick MODERATE (*) NEGATIVE    Bilirubin Urine NEGATIVE  NEGATIVE    Ketones, ur NEGATIVE  NEGATIVE mg/dL    Protein, ur NEGATIVE  NEGATIVE mg/dL    Urobilinogen, UA 1.0  0.0 - 1.0 mg/dL    Nitrite NEGATIVE  NEGATIVE    Leukocytes, UA NEGATIVE  NEGATIVE   URINE MICROSCOPIC-ADD ON     Status: Normal   Collection Time   05/24/12  1:30 PM      Component Value Range Comment   Squamous Epithelial / LPF RARE  RARE    WBC, UA 0-2  <3 WBC/hpf    RBC / HPF 3-6  <3 RBC/hpf    Bacteria, UA RARE  RARE    Urine-Other MUCOUS PRESENT     PROTIME-INR     Status: Abnormal   Collection Time   05/25/12  6:07 AM      Component Value Range Comment   Prothrombin Time 26.1 (*) 11.6 - 15.2 seconds    INR 2.54 (*) 0.00 - 1.49      HEENT: normal Cardio: RRR Resp: CTA B/L GI: mild distention, healing midline incsion non tender Extremity:  Pulses positive and No Edema Skin:   Wound C/D/I Neuro:  Alert/Oriented, Anxious, Normal Sensory and Normal Motor Musc/Skel:  Other Pectus excavatum Gen NAD GU:  Dried blood meatus no tenderness, no edema, no erythema   Assessment/Plan: 1. Functional deficits secondary to deconditioning , hypoxic encephalopathy resolving which require 3+ hours per day of interdisciplinary therapy in a comprehensive inpatient rehab setting. Physiatrist is providing close team supervision and 24 hour management of active medical problems listed below. Physiatrist and rehab team continue to assess barriers to discharge/monitor patient progress toward functional and medical goals. FIM: FIM -  Bathing Bathing Steps Patient Completed: Chest;Right Arm;Left Arm;Abdomen;Front perineal area;Buttocks;Right upper leg;Left upper leg Bathing: 4: Min-Patient completes 8-9 59f 10 parts or 75+ percent  FIM - Upper Body Dressing/Undressing Upper body dressing/undressing steps patient completed: Thread/unthread right sleeve of pullover shirt/dresss;Thread/unthread left sleeve of pullover shirt/dress;Put head through opening of pull over shirt/dress;Pull shirt over trunk Upper body dressing/undressing: 5: Set-up assist to: Obtain clothing/put away FIM - Lower Body Dressing/Undressing Lower body dressing/undressing steps patient completed: Thread/unthread right underwear leg;Thread/unthread left underwear leg;Pull underwear up/down;Thread/unthread right pants leg;Thread/unthread left pants leg;Pull pants up/down;Fasten/unfasten pants;Don/Doff right sock;Don/Doff left sock Lower body dressing/undressing: 4: Min-Patient completed 75 plus % of tasks  FIM - Toileting Toileting steps completed by patient: Adjust clothing prior to toileting;Performs perineal hygiene;Adjust clothing after toileting Toileting: 5: Supervision: Safety issues/verbal cues  FIM - Diplomatic Services operational officer Devices: Grab bars Toilet Transfers: 4-To toilet/BSC: Min A (steadying Pt. > 75%);4-From toilet/BSC: Min A (steadying Pt. > 75%)  FIM - Bed/Chair Transfer Bed/Chair Transfer Assistive Devices: Bed rails Bed/Chair Transfer: 5: Supine > Sit: Supervision (verbal cues/safety issues);5: Bed > Chair or W/C: Supervision (verbal cues/safety issues);5: Chair or W/C > Bed: Supervision (verbal cues/safety issues)  FIM - Locomotion: Wheelchair Locomotion: Wheelchair: 0: Activity did not occur FIM - Locomotion: Ambulation Locomotion: Ambulation Assistive Devices: Other (comment) (HHA) Ambulation/Gait Assistance: 4: Min assist Locomotion: Ambulation: 4: Travels 150 ft or more with minimal assistance  (Pt.>75%)  Comprehension Comprehension Mode: Auditory Comprehension: 5-Understands basic 90% of the time/requires cueing < 10% of the time  Expression Expression Mode: Verbal Expression: 6-Expresses complex ideas: With extra time/assistive device  Social Interaction Social Interaction: 6-Interacts appropriately with others with medication or extra time (anti-anxiety, antidepressant).  Problem Solving Problem Solving: 4-Solves basic 75 - 89% of the time/requires cueing 10 - 24% of the time  Memory Memory: 4-Recognizes or recalls 75 - 89% of the time/requires cueing 10 - 24% of the time  Medical Problem List and Plan:  1.PE: Pharmaceutical: Coumadin  2. Pain Management: N/A  3. Mood: seems to be in good spirits.  4. Neuropsych: This patient is capable of making decisions on his/her own behalf.  5. VF cardiac arrest: LifeVest in place.  6. NSTEMI s/p PTCA: continue ASA, Brilinta and coumadin for a month then ASA and coumadin  7. Pulmonary edema: check daily weights. Continue low salt restrictions. Continue lasix and pironalactone.  8. Recent SBO with lysis of adhesions: will schedule miralax to prevent constipation. Monitor and adjust bowel program as needed.  9. CAD: on coreg, lipitor, and lisinopril . Continue blood thinners for stent 10.  Insomnia  Trial ambien , trazodone not effective 11.  Meatal bleeding check UA, HgB stable monitor for recurrence, on 2 antiplatelet agents plus warfarin, may need to monitor this regimen if recurrent bleeding occurs  LOS (Days) 5 A FACE TO FACE EVALUATION WAS PERFORMED  Thanvi Blincoe E 05/25/2012, 8:55 AM

## 2012-05-25 NOTE — Progress Notes (Signed)
Speech Language Pathology Daily Session Note  Patient Details  Name: Louis Reid MRN: [id number] Date of Birth: [birth year]  Today's Date: 05/25/2012 Time: 1420-1505 Time Calculation (min): 45 min  Short Term Goals: Week 1: SLP Short Term Goal 1 (Week 1): Pt will follow 3 step commands during functional tasks including home management,  ADLs with min question cues to improve information retention. SLP Short Term Goal 2 (Week 1): Pt will use memory aids (white board, memory book) to recall daily complex information and new information with maximum question cues.  SLP Short Term Goal 3 (Week 1): Pt will demonstrate selective attention in controlled environment for 20 minutes with min question cues.   SLP Short Term Goal 4 (Week 1): Pt will demonstrate emergent awareness in controlled environment to memory deficits with min question and verbal cues.    Skilled Therapeutic Interventions: Skilled treatment session focused on addressing cognitive goals during various self-care tasks. Patient had just moved to a new room on a new unit and required total assist to be re-orientated to time, situation and place.  SLP assisted patient by organizing room similar to previous and locating external memory aids.  SLP facilitated session by requesting patient locate information to call his son and notify him of his move, request clothes and inform him of discharge date and need for 24/7 supervision upon discharge.  Patient required max assist to complete this task and left a message for son on voicemail.       FIM:  Comprehension Comprehension Mode: Auditory Comprehension: 5-Understands basic 90% of the time/requires cueing < 10% of the time Expression Expression Mode: Verbal Expression: 6-Expresses complex ideas: With extra time/assistive device Social Interaction Social Interaction: 6-Interacts appropriately with others with medication or extra time (anti-anxiety, antidepressant). Problem  Solving Problem Solving: 4-Solves basic 75 - 89% of the time/requires cueing 10 - 24% of the time Memory Memory: 4-Recognizes or recalls 75 - 89% of the time/requires cueing 10 - 24% of the time FIM - Eating Eating Activity: 0: Activity did not occur  Pain Pain Assessment Pain Assessment: No/denies pain  Therapy/Group: Individual Therapy  Charlane Ferretti., CCC-SLP [phone]  Nicholl Onstott 05/25/2012, 4:48 PM

## 2012-05-25 NOTE — Progress Notes (Signed)
Physical Therapy Session Note  Patient Details  Name: Louis Reid MRN: [id number] Date of Birth: [birth year]  Today's Date: 05/25/2012 Time: 0805-0905 Time Calculation (min): 60 min  Short Term Goals: Week 1:  PT Short Term Goal 1 (Week 1): STG's = LTG's  Skilled Therapeutic Interventions/Progress Updates:   Patient more disoriented and confused this am; noted strong urine odor.  Patient received extra LIFEVEST today and patient very concerned; he believed that it was a life vest for swimming and he became very anxious secondary to being unable to swim.  Reoriented patient to LIFEVEST that he is wearing and the purpose of it.  RN and MD notified of increased confusion and disorientation.  Performed gait in controlled environment with min A x 150' x 2 reps with minimal tactile and verbal cues for upright gaze, safety with obstacle negotiation and for L lateral weight shift and when returning to room patient cued to use environmental/visual cues to recall how to find room.  Performed dynamic standing balance on BIODEX; see below for details; improved L lateral weight shift and self correction during gait returning to room.  Returned to room and left in w/c with quick release belt in place for safety.    Therapy Documentation Precautions:  Precautions Precautions: Fall Precaution Comments: Life Vest Restrictions Weight Bearing Restrictions: No Vital Signs: Therapy Vitals Temp: 98.4 F (36.9 C) Temp src: Oral Pulse Rate: 88  Resp: 18  BP: 119/83 mmHg Patient Position, if appropriate: Sitting Oxygen Therapy SpO2: 99 % O2 Device: None (Room air) Pain: Pain Assessment Pain Assessment: No/denies pain Balance: Dynamic Standing Balance Dynamic Standing - Balance Support: Bilateral upper extremity supported;No upper extremity supported;During functional activity Dynamic Standing - Level of Assistance: 4: Min assist Dynamic Standing - Balance Activities: Lateral lean/weight  shifting;Forward lean/weight shifting;Biodex;Reaching for objects;Compliant surfaces Dynamic Standing - Comments: Use of BIODEX limits of stability on easy and medium setting with and without UE support and on stable >> compliant surfaces x 2 reps each with sitting rest breaks secondary to fatigue with min A for full L lateral and anterior weight shifting.  Carry over to functional reaching activity taking post it notes and from high and low and placing them on target to L with use of squats and L lateral weight shifting for ankle and hip strategy training   See FIM for current functional status  Therapy/Group: Individual Therapy  [name] [facility] 05/25/2012, 9:37 AM

## 2012-05-25 NOTE — Patient Care Conference (Signed)
Inpatient RehabilitationTeam Conference Note Date: 05/25/2012   Time: 10:51 AM    Patient Name: Louis Reid      Medical Record Number: [id number]  Date of Birth: [birth year] Sex: Male         Room/Bed: 4147/4147-01 Payor Info: Payor: [employer]vertising copy[employer] MEDICARE  Plan: [employer] MEDICARE COMPLETE  Product Type: *No Product type*     Admitting Diagnosis: anoxic encephalopathy  Admit Date/Time:  05/20/2012  2:43 PM Admission Comments: No comment available   Primary Diagnosis:  Physical deconditioning Principal Problem: Physical deconditioning  Patient Active Problem List   Diagnosis Date Noted  . STEMI (ST elevation myocardial infarction) 05/18/2012  . Acute systolic congestive heart failure, NYHA class 3/EF 20-25% 05/17/2012  . Anemia of critical illness 05/17/2012  . PE (pulmonary embolism) 05/17/2012  . Physical deconditioning 05/17/2012  . Anorexia 05/17/2012  . Cardiogenic shock 05/16/2012  . Hypotension 05/16/2012  . Ventricular fibrillation arrest 05/10/2012  . Cardiac arrest 05/10/2012  . Acute respiratory failure with hypoxia 2/2 cardiac arrest 05/10/2012  . Acute encephalopathy 2/2 cardiac arrest 05/10/2012  . Hyperglycemia 05/10/2012  . Odynophagia 05/07/2012  . Coronary artery disease 07/09/2011  . History of coronary artery bypass surgery 07/09/2011  . DYSPNEA 06/04/2010  . HYPERLIPIDEMIA 04/17/2009  . HYPERTENSION 04/17/2009  . Asymptomatic old MI (myocardial infarction) 04/17/2009  . TOBACCO USE, QUIT 04/17/2009    Expected Discharge Date: Expected Discharge Date: 05/28/12  Team Members Present: Physician leading conference: Dr. Claudette Laws Social Worker Present: Dossie Der, LCSW Nurse Present: Rosalio Macadamia, RN PT Present: [name], PT;Becky Henrene Dodge, PT;Other (comment) (Brdiget Ripa-PT) OT Present: Bretta Bang, OT SLP Present: Fae Pippin, SLP     Current Status/Progress Goal Weekly Team Focus  Medical   cont Bowel and bladder, reduced  orientation  education regarding Life vest  pt and family ed   Bowel/Bladder     cont b&b        Swallow/Nutrition/ Hydration     na        ADL's   supervision bathing, dressing not assessed since Sunday due to no clothes - however min assist, min-supervision transfers without AD  mod I overall, supervision tub/shower transfer  activity tolerance, dynamic standing balance, memory strategies and carryover of day to day activity   Mobility   min A overall  supervision overall  balance, gait, activity tolerance   Communication     WNL        Safety/Cognition/ Behavioral Observations  min-mod assist  supervision-mod I   increase carryover and use of strategies   Pain     no issues        Skin     na           *See Interdisciplinary Assessment and Plan and progress notes for long and short-term goals  Barriers to Discharge: Pt will need longer term 24/7 sup    Possible Resolutions to Barriers:  SW to explore whether pt's sone will provide sup    Discharge Planning/Teaching Needs:  Home son to stay with 1-2 weeks.  Pt doing well and wants to go home soon.      Team Discussion:  Some memory issues-will need supervision at discharge.  Endurance and balance.  Learning about the life vest-son to be in later and RN educate on also.  Revisions to Treatment Plan:  None   Continued Need for Acute Rehabilitation Level of Care: The patient requires daily medical management by a physician with specialized training in physical  medicine and rehabilitation for the following conditions: Daily direction of a multidisciplinary physical rehabilitation program to ensure safe treatment while eliciting the highest outcome that is of practical value to the patient.: Yes Daily medical management of patient stability for increased activity during participation in an intensive rehabilitation regime.: Yes Daily analysis of laboratory values and/or radiology reports with any subsequent need for  medication adjustment of medical intervention for : Cardiac problems  [name], [name] 05/27/2012, 10:51 AM

## 2012-05-25 NOTE — Progress Notes (Signed)
Occupational Therapy Session Note  Patient Details  Name: Louis Reid MRN: [id number] Date of Birth: 1933/03/20  Today's Date: 05/25/2012 Time: 0902-1000 Time Calculation (min): 58 min  Session 2: Time:  11:32-12:00 Time Calculation (min): 28 min  Short Term Goals: Week 1:  OT Short Term Goal 1 (Week 1): STG= LTG  Skilled Therapeutic Interventions/Progress Updates:    Session 1: Worked on bathing and dressing at sink level.  Pt able to perform all aspects of bathing and dressing with just min steady assist for sit to stand and standing balance.  Needs mod instructional cues to sequence through task and at times needs in-depth explanations to completely understand directions.  Stood to perform grooming tasks of brushing his teeth, combing his hair, and shaving.  Needed one rest break of 2 mins after standing for 5 mins during these tasks.  Session 2:  Pt practiced tub/shower transfers stepping over the edge of the tub with overall close supervision.  Discussed use of a seat in the shower for balance and endurance purposes.  Also worked on static and dynamic standing balance during session.  Pt able to maintain static standing with feet together and eyes closed without difficulty.  Demonstrated one LOB when attempting to stand in tandem but was able to regain with only min assist.    Therapy Documentation Precautions:  Precautions Precautions: Fall Precaution Comments: life vest Restrictions Weight Bearing Restrictions: No  Pain: Pain Assessment Pain Assessment: No/denies pain ADL: See FIM for current functional status  Therapy/Group: Individual Therapy  Elya Diloreto OTR/L 05/25/2012, 12:32 PM

## 2012-05-25 NOTE — Progress Notes (Signed)
Social Work Patient ID: Louis Reid, male   DOB: 04/17/1933, 78 y.o.   MRN: [id number] Spoke with son to answer questions and inform of team conference goals and discharge 12/21.  He reports he will be there for 1-2 weeks but then plans To go home to his home in [place].  Discussed education next time he is here on Life Vest.  Pt is being trained now.  Encouraged him to come in and  Attend therapies with pt, so he can see for himself the progress pt has made.  Discussed home health follow up and any DME.  Continue to work on discharge plans.

## 2012-05-26 ENCOUNTER — Inpatient Hospital Stay (HOSPITAL_COMMUNITY): Payer: Medicare Other | Admitting: Occupational Therapy

## 2012-05-26 ENCOUNTER — Inpatient Hospital Stay (HOSPITAL_COMMUNITY): Payer: Medicare Other | Admitting: Speech Pathology

## 2012-05-26 ENCOUNTER — Inpatient Hospital Stay (HOSPITAL_COMMUNITY): Payer: Medicare Other | Admitting: Physical Therapy

## 2012-05-26 NOTE — Progress Notes (Signed)
Speech Language Pathology Daily Session Note  Patient Details  Name: Louis Reid MRN: [id number] Date of Birth: [birth year]  Today's Date: 05/26/2012 Time: 4098-1191 Time Calculation (min): 45 min  Short Term Goals: Week 1: SLP Short Term Goal 1 (Week 1): Pt will follow 3 step commands during functional tasks including home management,  ADLs with min question cues to improve information retention. SLP Short Term Goal 2 (Week 1): Pt will use memory aids (white board, memory book) to recall daily complex information and new information with maximum question cues.  SLP Short Term Goal 3 (Week 1): Pt will demonstrate selective attention in controlled environment for 20 minutes with min question cues.   SLP Short Term Goal 4 (Week 1): Pt will demonstrate emergent awareness in controlled environment to memory deficits with min question and verbal cues.    Skilled Therapeutic Interventions: Skilled treatment session targeted cognition goals and family education. SLP facilitated session with discussion regarding current deficits as well as education regarding strategies to compensate for recall difficulties. Son present for session and verbalized understanding of information and SLP provided a handout. Patient continues to require min assist cues to initiate use of and organize notes.    FIM:  Comprehension Comprehension Mode: Auditory Comprehension: 5-Understands complex 90% of the time/Cues < 10% of the time Expression Expression Mode: Verbal Expression: 6-Expresses complex ideas: With extra time/assistive device Social Interaction Social Interaction: 6-Interacts appropriately with others with medication or extra time (anti-anxiety, antidepressant). Problem Solving Problem Solving: 4-Solves basic 75 - 89% of the time/requires cueing 10 - 24% of the time Memory Memory: 4-Recognizes or recalls 75 - 89% of the time/requires cueing 10 - 24% of the time FIM - Eating Eating Activity: 7:  Complete independence:no helper  Pain Pain Assessment Pain Assessment: No/denies pain Pain Score: 0-No pain  Therapy/Group: Individual Therapy  Charlane Ferretti., CCC-SLP [phone]  Louis Reid 05/26/2012, 4:40 PM

## 2012-05-26 NOTE — Progress Notes (Signed)
Physical Therapy Session Note  Patient Details  Name: Louis Reid MRN: [id number] Date of Birth: 02-11-1933  Today's Date: 05/26/2012 Time: 4098-1191 Time Calculation (min): 60 min  Short Term Goals: Week 1:  PT Short Term Goal 1 (Week 1): STG's = LTG's  Skilled Therapeutic Interventions/Progress Updates:   This session focused on family education (son present the entire session).  Gait with no assistive device supervision.  Mildly staggering gait pattern, usually when multi tasking (walking and talking, looking to the side and walking).  Gait >300' no assistive device supervision.  Stairs bil rails reciprocal pattern 6.5" steps supervision.  Car transfer supervision with cues for safety.  Furniture transfers in ADL apartment with education on smart choices for things to sit in at home and out in the community.  Mod I bed mobility on elevated bed (extra time needed).  Supervision for couch transfer with one armrest for support.  Balance activities in the parallel bars: tandem stand, feet together eyes closed, cone taps, single limb stand.  In hallway: tandem gait (min assist), retro gait, high knee marches side stepping all x 30'x2 with supervision.  Nu Step Level 5 x 6 mins for endurance training legs only (pt needed multiple standing and 2 sitting rest breaks during treatment).  We also worked on pt's cognition with 3 word recall.  He consistently remembered 2/3 words and never the same 2 words.  He perseverates quite a bit on his life vest and on not being able to remember the events surrounding his arrest.  I told him it might be a good idea if his son wrote down everything that happened and then he could reference it in the future.  He is not very self aware of his cognitive deficits at this point either. When asked if there was anything that was challenging outside of his physical deficits he reported that the life vest was challenging and that not being able to remember the arrest was  challenging, but mentioned nothing of current memory deficits or cognitive issues.  He stated, "oh, I am really good at figures" re: money management.  I also asked him if he thought it would be safe for him to drive and he wavered on the answer.    Therapy Documentation Precautions:  Precautions Precautions: Fall Precaution Comments: life vest Restrictions Weight Bearing Restrictions: No   Pain: Pain Assessment Pain Assessment: No/denies pain Pain Score: 0-No pain    Locomotion : Ambulation Ambulation/Gait Assistance: 5: Supervision   See FIM for current functional status  Therapy/Group: Individual Therapy  Lurena Joiner B. Kirill Chatterjee, PT, DPT [phone]   05/26/2012, 3:02 PM

## 2012-05-26 NOTE — Progress Notes (Signed)
Occupational Therapy Session Note  Patient Details  Name: DURWARD MATRANGA MRN: [id number] Date of Birth: [birth year]  Today's Date: 05/26/2012 Time: 4098-1191 and 1130-1200 Time Calculation (min): 60 min and 30 min  Short Term Goals: Week 1:  OT Short Term Goal 1 (Week 1): STG= LTG  Skilled Therapeutic Interventions/Progress Updates:    1) Pt seen for ADL retraining with focus on increased memory strategies and safety with bathing and dressing tasks.  Pt completed bathing at standing level at sink with min cues for sequencing, secondary to pt asking "is that right?" Encouraged pt to attempt to increase sequencing with fewer questions.  Pt donned socks and shoes in sitting without any difficulty.  Engaged in dynamic standing balance activity with pt with 1 LOB backwards, requiring mod assist to regain.  Pt retrieved items from floor with close supervision.  2) 1:1 OT with focus on dynamic standing balance and weight shifting.  Pt required min cues for weight shifting into heels to complete weight shifting task on biodex, secondary to his hesitancy to weight shift backwards/onto heels.  Pt's lifevest started beeping during session, reporting low battery.  RN informed and changed battery.  Discussed with pt to tend to lifevest and follow it's directions and not just ignore it.  Therapy Documentation Precautions:  Precautions Precautions: Fall Precaution Comments: life vest Restrictions Weight Bearing Restrictions: No General:   Vital Signs: Therapy Vitals Temp: 98.1 F (36.7 C) Temp src: Oral Pulse Rate: 74  Resp: 19  BP: 98/65 mmHg Patient Position, if appropriate: Lying Oxygen Therapy SpO2: 99 % O2 Device: None (Room air) Pain: Pain Assessment Pain Assessment: No/denies pain  See FIM for current functional status  Therapy/Group: Individual Therapy  [name] 05/26/2012, 10:50 AM

## 2012-05-26 NOTE — Progress Notes (Signed)
Educate patient and son on changing the battery for life vest.  Both patient and son were able to demonstrate/ teach back on the process of changing/charging the battery.  Patient states he will need some reinforcement before discharge.

## 2012-05-26 NOTE — Progress Notes (Signed)
Patient ID: Louis Reid, male   DOB: 1932-12-14, 78 y.o.   MRN: [id number] Subjective/Complaints: 78 y.o. male with history of CAD, recent admission for SBO with lysis of adhesions, who was admitted on 05/10/12 past witnessed cardiac arrest. Bystander CPR X 10 mins , AED shock X3. Was noted to be in atrial fibrillation and intubated in ED. He was taken to cath lab and underwent PTCA with thrombectomy of occluded LAD by Dr. Clifton James. He developed hematuria/bloody oral secretions with intermittent VT. Hypokalemia supplemented and integrelin discontinued. CT head done due to lethargy and showed no acute changes. Was started on IV antibiotics for leucocytosis. CT chest with tiny PE in lingula and CT abdomen with distended GB with mild ascites and diffuse body wall edema. Started on heparin drip for PE. BLE dopplers negative for DVT. Tolerated extubation 12/08 and encephalopathy slowly resolving. Diet initiated 12/9 due to improvement in mentation and no s/s of aspiration. LifeVest fitted  Son complteted training no questions except about meds Pt recognizes some confusion Alert and Oriented x 3  Review of Systems  Constitutional: Negative for fever and chills.  Respiratory: Negative for cough and shortness of breath.   Cardiovascular: Negative for chest pain.  Gastrointestinal: Negative for diarrhea and constipation.  All other systems reviewed and are negative.    Objective: Vital Signs: Blood pressure 90/67, pulse 100, temperature 98.1 F (36.7 C), temperature source Oral, resp. rate 18, height 6' (1.829 m), weight 54.795 kg (120 lb 12.8 oz), SpO2 93.00%. No results found. Results for orders placed during the hospital encounter of 05/20/12 (from the past 72 hour(s))  URINE CULTURE     Status: Normal (Preliminary result)   Collection Time   05/23/12 10:32 PM      Component Value Range Comment   Specimen Description URINE, RANDOM      Special Requests NONE      Culture  Setup Time 05/24/2012  05:56      Colony Count 35,000 COLONIES/ML      Culture        Value: PSEUDOMONAS AERUGINOSA     ESCHERICHIA COLI   Report Status PENDING     CBC     Status: Abnormal   Collection Time   05/24/12  6:30 AM      Component Value Range Comment   WBC 7.5  4.0 - 10.5 K/uL    RBC 2.98 (*) 4.22 - 5.81 MIL/uL    Hemoglobin 9.3 (*) 13.0 - 17.0 g/dL    HCT 27.2 (*) 53.6 - 52.0 %    MCV 97.0  78.0 - 100.0 fL    MCH 31.2  26.0 - 34.0 pg    MCHC 32.2  30.0 - 36.0 g/dL    RDW 64.4  03.4 - 74.2 %    Platelets 241  150 - 400 K/uL   URINALYSIS, ROUTINE W REFLEX MICROSCOPIC     Status: Abnormal   Collection Time   05/24/12  1:30 PM      Component Value Range Comment   Color, Urine YELLOW  YELLOW    APPearance CLEAR  CLEAR    Specific Gravity, Urine 1.020  1.005 - 1.030    pH 5.0  5.0 - 8.0    Glucose, UA NEGATIVE  NEGATIVE mg/dL    Hgb urine dipstick MODERATE (*) NEGATIVE    Bilirubin Urine NEGATIVE  NEGATIVE    Ketones, ur NEGATIVE  NEGATIVE mg/dL    Protein, ur NEGATIVE  NEGATIVE mg/dL    Urobilinogen,  UA 1.0  0.0 - 1.0 mg/dL    Nitrite NEGATIVE  NEGATIVE    Leukocytes, UA NEGATIVE  NEGATIVE   URINE MICROSCOPIC-ADD ON     Status: Normal   Collection Time   05/24/12  1:30 PM      Component Value Range Comment   Squamous Epithelial / LPF RARE  RARE    WBC, UA 0-2  <3 WBC/hpf    RBC / HPF 3-6  <3 RBC/hpf    Bacteria, UA RARE  RARE    Urine-Other MUCOUS PRESENT     PROTIME-INR     Status: Abnormal   Collection Time   05/25/12  6:07 AM      Component Value Range Comment   Prothrombin Time 26.1 (*) 11.6 - 15.2 seconds    INR 2.54 (*) 0.00 - 1.49      HEENT: normal Cardio: RRR Resp: CTA B/L GI: mild distention, healing midline incsion non tender Extremity:  Pulses positive and No Edema Skin:   Wound C/D/I Neuro: Alert/Oriented, Anxious, Normal Sensory and Normal Motor Musc/Skel:  Other Pectus excavatum Gen NAD GU:  Dried blood meatus no tenderness, no edema, no  erythema   Assessment/Plan: 1. Functional deficits secondary to deconditioning , hypoxic encephalopathy should be ready for D/C 12/21 Will give D/C instructions 12/20 Cardiology f/u they will release to driving when appropriate PMR f/u x1  PCP f/u FIM: FIM - Bathing Bathing Steps Patient Completed: Right Arm;Chest;Left Arm;Abdomen Bathing: 5: Supervision: Safety issues/verbal cues  FIM - Upper Body Dressing/Undressing Upper body dressing/undressing steps patient completed: Thread/unthread right sleeve of front closure shirt/dress;Thread/unthread left sleeve of front closure shirt/dress;Pull shirt around back of front closure shirt/dress;Button/unbutton shirt Upper body dressing/undressing: 5: Set-up assist to: Obtain clothing/put away FIM - Lower Body Dressing/Undressing Lower body dressing/undressing steps patient completed: Thread/unthread right underwear leg;Thread/unthread left underwear leg;Pull underwear up/down;Thread/unthread right pants leg;Thread/unthread left pants leg;Pull pants up/down;Fasten/unfasten pants;Don/Doff right sock;Don/Doff left sock;Don/Doff right shoe;Don/Doff left shoe;Fasten/unfasten right shoe;Fasten/unfasten left shoe Lower body dressing/undressing: 5: Set-up assist to: Obtain clothing  FIM - Toileting Toileting steps completed by patient: Adjust clothing prior to toileting;Performs perineal hygiene;Adjust clothing after toileting Toileting Assistive Devices: Grab bar or rail for support Toileting: 4: Steadying assist  FIM - Diplomatic Services operational officer Devices: Grab bars Toilet Transfers: 4-To toilet/BSC: Min A (steadying Pt. > 75%);4-From toilet/BSC: Min A (steadying Pt. > 75%)  FIM - Bed/Chair Transfer Bed/Chair Transfer Assistive Devices: Bed rails Bed/Chair Transfer: 6: Sit > Supine: No assist;6: More than reasonable amt of time;6: Supine > Sit: No assist;5: Bed > Chair or W/C: Supervision (verbal cues/safety issues);5: Chair or W/C >  Bed: Supervision (verbal cues/safety issues)  FIM - Locomotion: Wheelchair Locomotion: Wheelchair: 0: Activity did not occur FIM - Locomotion: Ambulation Locomotion: Ambulation Assistive Devices: Other (comment) (HHA) Ambulation/Gait Assistance: 5: Supervision Locomotion: Ambulation: 5: Travels 150 ft or more with supervision/safety issues  Comprehension Comprehension Mode: Auditory Comprehension: 5-Understands complex 90% of the time/Cues < 10% of the time  Expression Expression Mode: Verbal Expression: 6-Expresses complex ideas: With extra time/assistive device  Social Interaction Social Interaction: 6-Interacts appropriately with others with medication or extra time (anti-anxiety, antidepressant).  Problem Solving Problem Solving: 4-Solves basic 75 - 89% of the time/requires cueing 10 - 24% of the time  Memory Memory: 4-Recognizes or recalls 75 - 89% of the time/requires cueing 10 - 24% of the time  Medical Problem List and Plan:  1.PE: Pharmaceutical: Coumadin  2. Pain Management: N/A  3. Mood:  seems to be in good spirits.  4. Neuropsych: This patient is capable of making decisions on his/her own behalf.  5. VF cardiac arrest: LifeVest in place.  6. NSTEMI s/p PTCA: continue ASA, Brilinta and coumadin for a month then ASA and coumadin  7. Pulmonary edema: check daily weights. Continue low salt restrictions. Continue lasix and pironalactone.  8. Recent SBO with lysis of adhesions: will schedule miralax to prevent constipation. Monitor and adjust bowel program as needed.  9. CAD: on coreg, lipitor, and lisinopril . Continue blood thinners for stent 10.  Insomnia  Trial ambien , trazodone not effective 11.  Meatal reolved check UA,  Evidence of colonization low colony count will not treat  LOS (Days) 6 A FACE TO FACE EVALUATION WAS PERFORMED  Chinmay Squier E 05/26/2012, 3:41 PM

## 2012-05-27 ENCOUNTER — Inpatient Hospital Stay (HOSPITAL_COMMUNITY): Payer: Medicare Other | Admitting: Physical Therapy

## 2012-05-27 ENCOUNTER — Inpatient Hospital Stay (HOSPITAL_COMMUNITY): Payer: Medicare Other | Admitting: Speech Pathology

## 2012-05-27 ENCOUNTER — Inpatient Hospital Stay (HOSPITAL_COMMUNITY): Payer: Medicare Other | Admitting: *Deleted

## 2012-05-27 ENCOUNTER — Inpatient Hospital Stay (HOSPITAL_COMMUNITY): Payer: Medicare Other | Admitting: Occupational Therapy

## 2012-05-27 MED ORDER — WARFARIN SODIUM 2.5 MG PO TABS: 2.5000 mg | ORAL_TABLET | Freq: Once | ORAL | Status: DC

## 2012-05-27 MED ORDER — POLYETHYLENE GLYCOL 3350 17 G PO PACK: 17.0000 g | PACK | Freq: Every day | ORAL | Status: DC

## 2012-05-27 MED ORDER — WARFARIN SODIUM 2.5 MG PO TABS: 2.5000 mg | ORAL_TABLET | Freq: Every day | ORAL | Status: DC

## 2012-05-27 NOTE — Progress Notes (Signed)
Occupational Therapy Discharge Summary  Patient Details  Name: Louis Reid MRN: [id number] Date of Birth: 07/04/1932  Today's Date: 05/27/2012  Patient has met 8 of 8 long term goals due to improved activity tolerance, improved balance, postural control, ability to compensate for deficits and improved awareness.  Patient to discharge at overall Modified Independent level.  Patient's care partner is independent to provide the necessary cognitive assistance at discharge.  Patient requires occasional cues for sequencing and problem solving with ADLs secondary to impaired memory of novel tasks.  Patient's son did not attend any OT sessions, however was present with PT and SLP and he reports no further questions.  Reasons goals not met: N/A  Recommendation:  Patient will benefit from ongoing skilled OT services in home health setting to continue to advance functional skills in the area of BADL, iADL and Reduce care partner burden.  Equipment: No equipment provided  Reasons for discharge: treatment goals met and discharge from hospital  Patient/family agrees with progress made and goals achieved: Yes  OT Discharge Precautions/Restrictions  Precautions Precautions: None Precaution Comments: life vest General   Vital Signs Therapy Vitals Temp: 97.6 F (36.4 C) Temp src: Oral Pulse Rate: 94  Resp: 19  BP: 95/60 mmHg Patient Position, if appropriate: Sitting Oxygen Therapy SpO2: 94 % O2 Device: None (Room air) Pain Pain Assessment Pain Assessment: No/denies pain Pain Score: 0-No pain ADL ADL Grooming: Modified independent Where Assessed-Grooming: Sitting at sink;Standing at sink Upper Body Bathing: Modified independent Where Assessed-Upper Body Bathing: Sitting at sink Lower Body Bathing: Modified independent Where Assessed-Lower Body Bathing: Sitting at sink;Standing at sink Upper Body Dressing: Modified independent (Device) Where Assessed-Upper Body Dressing: Sitting  at sink Lower Body Dressing: Modified independent Where Assessed-Lower Body Dressing: Sitting at sink;Standing at sink Toilet Transfer: Close supervision Tub/Shower Transfer: Close supervison Vision/Perception  Vision - History Baseline Vision: Wears glasses only for reading Patient Visual Report: No change from baseline  Cognition   Impaired:  Pt requires cues for sequencing and problem solving. Sensation Sensation Light Touch: Appears Intact Stereognosis: Not tested Hot/Cold: Appears Intact Proprioception: Appears Intact Coordination Gross Motor Movements are Fluid and Coordinated: Yes Fine Motor Movements are Fluid and Coordinated: Yes Motor  Motor Motor: Within Functional Limits Mobility  Bed Mobility Bed Mobility: Supine to Sit;Sit to Supine  Trunk/Postural Assessment  Cervical Assessment Cervical Assessment:  (forward head posture) Thoracic Assessment Thoracic Assessment:  (slight kyphosis) Lumbar Assessment Lumbar Assessment:  (posterior pelvic tilt, decreased lordosis)  Balance Standardized Balance Assessment Standardized Balance Assessment: Dynamic Gait Index Dynamic Gait Index Level Surface: Normal Change in Gait Speed: Normal Gait with Horizontal Head Turns: Mild Impairment Gait with Vertical Head Turns: Mild Impairment Gait and Pivot Turn: Normal Step Over Obstacle: Mild Impairment Step Around Obstacles: Normal Steps: Mild Impairment Total Score: 20  Extremity/Trunk Assessment RUE Assessment RUE Assessment: Within Functional Limits LUE Assessment LUE Assessment: Within Functional Limits  See FIM for current functional status  [name] 05/27/2012, 4:13 PM

## 2012-05-27 NOTE — Progress Notes (Signed)
Physical Therapy Session Note  Patient Details  Name: Louis Reid MRN: [id number] Date of Birth: June 04, 1933  Today's Date: 05/27/2012 Time: 1478-2956 Time Calculation (min): 40 min  Short Term Goals: Week 1:  PT Short Term Goal 1 (Week 1): STG's = LTG's  Skilled Therapeutic Interventions/Progress Updates:    Today's session focused on dynamic standing balance and increasing endurance with gait training. BERG Balance Test performed, see below.  Patient left seated in recliner with all needs within reach.  Therapy Documentation Precautions:  Precautions Precautions: Fall Precaution Comments: life vest Restrictions Weight Bearing Restrictions: No Vital Signs: Therapy Vitals Pulse Rate: 81  Patient Position, if appropriate: Sitting Oxygen Therapy SpO2: 100 % O2 Device: None (Room air) Pulse Oximetry Type: Intermittent Pain: Pain Assessment Pain Assessment: No/denies pain Pain Score: 0-No pain Mobility: Transfers Sit to Stand: 5: Supervision;From chair/3-in-1;With armrests;Without upper extremity assist Sit to Stand Details: Verbal cues for precautions/safety Stand to Sit: 5: Supervision;With armrests;To chair/3-in-1;Without upper extremity assist Stand to Sit Details (indicate cue type and reason): Verbal cues for precautions/safety Locomotion : Ambulation Ambulation: Yes Ambulation/Gait Assistance: 5: Supervision Ambulation Distance (Feet): 225 Feet, 155 feet Assistive device: None Ambulation/Gait Assistance Details: Verbal cues for precautions/safety Gait Gait: Yes Wheelchair Mobility Wheelchair Mobility: Yes Wheelchair Assistance: 1: +1 Total assist Wheelchair Parts Management: Needs assistance Distance: 150   Balance: Balance Balance Assessed: Yes Standardized Balance Assessment Standardized Balance Assessment: Berg Balance Test Berg Balance Test Sit to Stand: Able to stand without using hands and stabilize independently Standing Unsupported: Able to  stand safely 2 minutes Sitting with Back Unsupported but Feet Supported on Floor or Stool: Able to sit safely and securely 2 minutes Stand to Sit: Controls descent by using hands Transfers: Able to transfer safely, minor use of hands Standing Unsupported with Eyes Closed: Able to stand 10 seconds safely Standing Ubsupported with Feet Together: Able to place feet together independently and stand 1 minute safely From Standing, Reach Forward with Outstretched Arm: Can reach forward >12 cm safely (5") From Standing Position, Pick up Object from Floor: Able to pick up shoe safely and easily From Standing Position, Turn to Look Behind Over each Shoulder: Looks behind from both sides and weight shifts well Turn 360 Degrees: Able to turn 360 degrees safely one side only in 4 seconds or less Standing Unsupported, Alternately Place Feet on Step/Stool: Able to stand independently and safely and complete 8 steps in 20 seconds Standing Unsupported, One Foot in Front: Able to plae foot ahead of the other independently and hold 30 seconds Standing on One Leg: Able to lift leg independently and hold equal to or more than 3 seconds Total Score: 50/56, indicating patient is at moderate risk for falls (>50%)   See FIM for current functional status  Therapy/Group: Individual Therapy  Chipper Herb. Terique Kawabata, PT, DPT  05/27/2012, 12:38 PM

## 2012-05-27 NOTE — Progress Notes (Signed)
ANTICOAGULATION CONSULT NOTE - Follow Up Consult  Pharmacy Consult for Coumadin Indication: pulmonary embolus  Allergies  Allergen Reactions  . Cold Medicine Plus (Chlorphen-Pseudoephed-Apap)     COLD MEDICATIONS MAKE HIM "LOOPY"   Labs:  Basename 05/27/12 0525 05/25/12 0607  HGB -- --  HCT -- --  PLT -- --  APTT -- --  LABPROT 24.4* 26.1*  INR 2.32* 2.54*  HEPARINUNFRC -- --  CREATININE -- --  CKTOTAL -- --  CKMB -- --  TROPONINI -- --    Estimated Creatinine Clearance: 57 ml/min (by C-G formula based on Cr of 0.85).  Assessment: Louis Reid is a 78 year old male s/p heparin to coumadin bridge for PE. INR continues to be therapeutic. No bleeding noted. Also on Aspirin 81 mg and Brilinta 90 mg BID. Noted short course of Brilinta planned; may stop Aspirin while on Coumadin, if Hgb trends down.  H/H stable. Some meatal bleeding reported 12/7 - will follow.   Goal of Therapy:  INR 2-3   Plan:  1) Coumadin 2.5 mg po daily 2) continue INR  M/W/F  Thank you. Okey Regal, PharmD [phone]  05/27/2012 11:59 AM

## 2012-05-27 NOTE — Progress Notes (Signed)
Patient ID: Louis Reid, male   DOB: [birth year], 78 y.o.   MRN: [id number] Subjective/Complaints: 78 y.o. male with history of CAD, recent admission for SBO with lysis of adhesions, who was admitted on 05/10/12 past witnessed cardiac arrest. Bystander CPR X 10 mins , AED shock X3. Was noted to be in atrial fibrillation and intubated in ED. He was taken to cath lab and underwent PTCA with thrombectomy of occluded LAD by Dr. Clifton James. He developed hematuria/bloody oral secretions with intermittent VT. Hypokalemia supplemented and integrelin discontinued. CT head done due to lethargy and showed no acute changes. Was started on IV antibiotics for leucocytosis. CT chest with tiny PE in lingula and CT abdomen with distended GB with mild ascites and diffuse body wall edema. Started on heparin drip for PE. BLE dopplers negative for DVT. Tolerated extubation 12/08 and encephalopathy slowly resolving. Diet initiated 12/9 due to improvement in mentation and no s/s of aspiration. LifeVest fitted   Pt recognizes some confusion,Tends to repeat questions Alert and Oriented x 3   Review of Systems  Constitutional: Negative for fever and chills.  Respiratory: Negative for cough and shortness of breath.   Cardiovascular: Negative for chest pain.  Gastrointestinal: Negative for diarrhea and constipation.  All other systems reviewed and are negative.    Objective: Vital Signs: Blood pressure 95/60, pulse 94, temperature 97.6 F (36.4 C), temperature source Oral, resp. rate 19, height 6' (1.829 m), weight 55.384 kg (122 lb 1.6 oz), SpO2 94.00%. No results found. Results for orders placed during the hospital encounter of 05/20/12 (from the past 72 hour(s))  PROTIME-INR     Status: Abnormal   Collection Time   05/25/12  6:07 AM      Component Value Range Comment   Prothrombin Time 26.1 (*) 11.6 - 15.2 seconds    INR 2.54 (*) 0.00 - 1.49   PROTIME-INR     Status: Abnormal   Collection Time   05/27/12  5:25  AM      Component Value Range Comment   Prothrombin Time 24.4 (*) 11.6 - 15.2 seconds    INR 2.32 (*) 0.00 - 1.49      HEENT: normal Cardio: RRR Resp: CTA B/L GI: mild distention, healing midline incsion non tender Extremity:  Pulses positive and No Edema Skin:   Wound C/D/I Neuro: Alert/Oriented, Anxious, Normal Sensory and Normal Motor Musc/Skel:  Other Pectus excavatum Gen NAD    Assessment/Plan: 1. Functional deficits secondary to deconditioning , hypoxic encephalopathy should be ready for D/C 12/21 Will give D/C instructions 12/20 Cardiology f/u they will release to driving when appropriate PMR f/u x1  PCP f/u FIM: FIM - Bathing Bathing Steps Patient Completed: Chest;Right Arm;Left Arm;Abdomen;Front perineal area;Buttocks;Right upper leg;Left upper leg;Right lower leg (including foot);Left lower leg (including foot) Bathing: 6: More than reasonable amount of time  FIM - Upper Body Dressing/Undressing Upper body dressing/undressing steps patient completed: Thread/unthread right sleeve of front closure shirt/dress;Thread/unthread left sleeve of front closure shirt/dress;Pull shirt around back of front closure shirt/dress;Button/unbutton shirt Upper body dressing/undressing: 6: More than reasonable amount of time FIM - Lower Body Dressing/Undressing Lower body dressing/undressing steps patient completed: Thread/unthread right underwear leg;Thread/unthread left underwear leg;Pull underwear up/down;Thread/unthread right pants leg;Thread/unthread left pants leg;Pull pants up/down;Fasten/unfasten pants;Don/Doff right sock;Don/Doff left sock;Don/Doff right shoe;Don/Doff left shoe;Fasten/unfasten right shoe;Fasten/unfasten left shoe Lower body dressing/undressing: 6: More than reasonable amount of time  FIM - Toileting Toileting steps completed by patient: Adjust clothing prior to toileting;Performs perineal hygiene;Adjust clothing after toileting Toileting  Assistive Devices: Grab  bar or rail for support Toileting: 4: Steadying assist  FIM - Diplomatic Services operational officer Devices: Therapist, music Transfers: 5-To toilet/BSC: Supervision (verbal cues/safety issues);5-From toilet/BSC: Supervision (verbal cues/safety issues)  FIM - Press photographer Assistive Devices: Arm rests Bed/Chair Transfer: 6: Supine > Sit: No assist;6: Sit > Supine: No assist;5: Bed > Chair or W/C: Supervision (verbal cues/safety issues);5: Chair or W/C > Bed: Supervision (verbal cues/safety issues)  FIM - Locomotion: Wheelchair Distance: 150 Locomotion: Wheelchair: 0: Activity did not occur FIM - Locomotion: Ambulation Locomotion: Ambulation Assistive Devices: Other (comment) (HHA) Ambulation/Gait Assistance: 5: Supervision Locomotion: Ambulation: 5: Travels 150 ft or more with supervision/safety issues  Comprehension Comprehension Mode: Auditory Comprehension: 5-Understands complex 90% of the time/Cues < 10% of the time  Expression Expression Mode: Verbal Expression: 6-Expresses complex ideas: With extra time/assistive device  Social Interaction Social Interaction: 5-Interacts appropriately 90% of the time - Needs monitoring or encouragement for participation or interaction.  Problem Solving Problem Solving: 4-Solves basic 75 - 89% of the time/requires cueing 10 - 24% of the time  Memory Memory: 4-Recognizes or recalls 75 - 89% of the time/requires cueing 10 - 24% of the time  Medical Problem List and Plan:  1.PE: Pharmaceutical: Coumadin  2. Pain Management: N/A  3. Mood: seems to be in good spirits.  4. Neuropsych: This patient is capable of making decisions on his/her own behalf.  5. VF cardiac arrest: LifeVest in place.  6. NSTEMI s/p PTCA: continue ASA, Brilinta and coumadin for a month then ASA and coumadin  7. Pulmonary edema: check daily weights. Continue low salt restrictions. Continue lasix and pironalactone.  8. Recent SBO with  lysis of adhesions: will schedule miralax to prevent constipation. Monitor and adjust bowel program as needed.  9. CAD: on coreg, lipitor, and lisinopril . Continue blood thinners for stent 10.  Insomnia  Trial ambien , trazodone not effective 11.  Meatal reolved check UA,  Evidence of colonization low colony count will not treat  LOS (Days) 7 A FACE TO FACE EVALUATION WAS PERFORMED  KIRSTEINS,ANDREW E 05/27/2012, 5:14 PM

## 2012-05-27 NOTE — Progress Notes (Signed)
Occupational Therapy Session Note  Patient Details  Name: Louis Reid MRN: [id number] Date of Birth: Jul 24, 1932  Today's Date: 05/27/2012 Time: 0930-1030 Time Calculation (min): 60 min  Short Term Goals: Week 1:  OT Short Term Goal 1 (Week 1): STG= LTG  Skilled Therapeutic Interventions/Progress Updates:    Pt seen for ADL retraining at sink per pt's request (reports he doesn't plan to shower until he is no longer wearing life vest).  Encouraged pt to attempt sequencing of bathing and dressing with increased independence and fewer questions to this clinician to promote problem solving.  Pt with fewer questions for sequencing, however with questions regarding life vest and clothing management around it.  Pt demonstrated increased problem solving aloud.  Pt required 3 rest breaks during bathing and dressing.  Ambulated without AD and gathered clothing prior to dressing.  Educated pt, with RN, on changing battery and hand placement to increase independence with task.  Pt reports his son will assist, but he does not plan to stay long term, so encouraged pt to return demonstrate task.  Engaged in tub/shower transfer with pt stepping over ledge, requiring min cues for hand placement to increase safety.  Educated on use of shower chair to increase endurance and safety, pt again reports no plan to bathe at shower for awhile and refused shower chair however reports understanding.  Therapy Documentation Precautions:  Precautions Precautions: Fall Precaution Comments: life vest Restrictions Weight Bearing Restrictions: No General:   Vital Signs: Therapy Vitals Pulse Rate: 81  BP: 96/60 mmHg Patient Position, if appropriate: Sitting Oxygen Therapy SpO2: 100 % O2 Device: None (Room air) Pulse Oximetry Type: Intermittent Pain: Pain Assessment Pain Assessment: No/denies pain Pain Score: 0-No pain  See FIM for current functional status  Therapy/Group: Individual Therapy  [name] 05/27/2012, 12:24 PM

## 2012-05-27 NOTE — Progress Notes (Signed)
Physical Therapy Discharge Summary  Patient Details  Name: Louis Reid MRN: [id number] Date of Birth: 07/11/1932  Today's Date: 05/27/2012 Time: 7829-5621 Time Calculation (min): 43 min  Patient has met 11 of 11 long term goals due to improved activity tolerance, improved balance, increased strength and improved coordination.  Patient to discharge at an ambulatory level Supervision.   Patient's care partner is independent to provide the necessary cognitive and supervision assistance at discharge.  Reasons goals not met: All goals met  Recommendation:  Patient will benefit from ongoing skilled PT services in home health setting to continue to advance safe functional mobility, address ongoing impairments in decreased activity tolerance and endurance with focus on cardiopulmonary rehabilitation, impaired dynamic standing balance, LE strength, gait, and minimize fall risk.  Equipment: No equipment provided  Reasons for discharge: treatment goals met and discharge from hospital  Patient/family agrees with progress made and goals achieved: Yes  PT Discharge Pain Pain Assessment Pain Assessment: No/denies pain  Cognition  Still presents with short term memory impairments Sensation Sensation Light Touch: Appears Intact Stereognosis: Not tested Hot/Cold: Not tested Proprioception: Appears Intact Coordination Gross Motor Movements are Fluid and Coordinated: Yes Fine Motor Movements are Fluid and Coordinated: Yes Motor  Motor Motor: Within Functional Limits  Mobility Bed Mobility Bed Mobility: Supine to Sit;Sit to Supine Transfers Stand Pivot Transfers: 5: Supervision Stand Pivot Transfer Details (indicate cue type and reason): No physical assistance needed but supervision needed secondary to patient being slightly off balance when first standing  Discussed with patient falls risk at home and had patient verbalize how he would transfer floor > furniture and indications for  calling EMS; patient demonstrated good awareness of medical emergency but does not have a cell phone or other way to contact EMS if he is on the floor; patient to discuss with son purchasing cell phone for emergencies.  Patient demonstrated safe floor>mat transfer mod I.  Locomotion  Ambulation Ambulation: Yes Ambulation/Gait Assistance: 5: Supervision Ambulation Distance (Feet): 150 Feet x 2 Assistive device: None Ambulation/Gait Assistance Details: Verbal cues for precautions/safety Gait Gait: Yes High Level Ambulation High Level Ambulation: Backwards walking;Direction changes;Sudden stops;Head turns with supervision with minor LOB during head turns Stairs / Additional Locomotion Stairs: Yes Stairs Assistance: 5: Supervision Stair Management Technique: Two rails;Alternating pattern;Forwards Number of Stairs: 12  Height of Stairs: 7  Wheelchair Mobility Wheelchair Mobility: No Trunk/Postural Assessment  Cervical Assessment Cervical Assessment:  (forward head posture) Thoracic Assessment Thoracic Assessment:  (slight kyphosis) Lumbar Assessment Lumbar Assessment:  (posterior pelvic tilt, decreased lordosis)  Balance Balance Balance Assessed: Yes Standardized Balance Assessment Standardized Balance Assessment: Dynamic Gait Index Berg Balance Test Sit to Stand: Able to stand without using hands and stabilize independently Standing Unsupported: Able to stand safely 2 minutes Sitting with Back Unsupported but Feet Supported on Floor or Stool: Able to sit safely and securely 2 minutes Stand to Sit: Controls descent by using hands Transfers: Able to transfer safely, minor use of hands Standing Unsupported with Eyes Closed: Able to stand 10 seconds safely Standing Ubsupported with Feet Together: Able to place feet together independently and stand 1 minute safely From Standing, Reach Forward with Outstretched Arm: Can reach forward >12 cm safely (5") From Standing Position, Pick up  Object from Floor: Able to pick up shoe safely and easily From Standing Position, Turn to Look Behind Over each Shoulder: Looks behind from both sides and weight shifts well Turn 360 Degrees: Able to turn 360 degrees safely one side only  in 4 seconds or less Standing Unsupported, Alternately Place Feet on Step/Stool: Able to stand independently and safely and complete 8 steps in 20 seconds Standing Unsupported, One Foot in Front: Able to plae foot ahead of the other independently and hold 30 seconds Standing on One Leg: Able to lift leg independently and hold equal to or more than 3 seconds Total Score: 50  Patient demonstrates increased fall risk as noted by score of  50/56 on Berg Balance Scale.  (<36= high risk for falls, close to 100%; 37-45 significant >80%; 46-51 moderate >50%; 52-55 lower >25%)  (improvement from 40/56 at eval).  Dynamic Gait Index Level Surface: Normal Change in Gait Speed: Normal Gait with Horizontal Head Turns: Mild Impairment Gait with Vertical Head Turns: Mild Impairment Gait and Pivot Turn: Normal Step Over Obstacle: Mild Impairment Step Around Obstacles: Normal Steps: Mild Impairment Total Score: 20  Extremity Assessment  RLE Assessment RLE Assessment: Within Functional Limits LLE Assessment LLE Assessment: Within Functional Limits  See FIM for current functional status  [name] 05/27/2012, 3:33 PM

## 2012-05-27 NOTE — Progress Notes (Signed)
Pt educated on life vest and changing battery, pt able to perform a return demonstration of changing battery in life vest

## 2012-05-27 NOTE — Progress Notes (Signed)
NUTRITION FOLLOW UP  Intervention:   Continue Regular diet, encouraged intake, provide preferences Continue Ensure Complete bid, Encouraged pt to drink at least one at [facility]  Nutrition Dx:   Malnutrition Related to acute and chronic illness as evidenced by decreased body fat and muscle mass.  Goal:   Intake of >75% meals and supplents  Monitor:   Intake, labs, weight  Assessment:   Patient reports a good appetite and intake.  Eating 100% of meals.  Weight is stable but needs to gain weight.    Height: Ht Readings from Last 1 Encounters:  05/20/12 6' (1.829 m)    Weight Status:   Wt Readings from Last 1 Encounters:  05/27/12 122 lb 1.6 oz (55.384 kg)    Re-estimated needs:  Kcal: 2000-2200 Protein: 80-90 gm Fluid: 2-2.2L  Skin: wound C/D/I  Diet Order: General   Intake/Output Summary (Last 24 hours) at 05/27/12 1413 Last data filed at 05/27/12 1245  Gross per 24 hour  Intake    780 ml  Output      0 ml  Net    780 ml    Labs:   Lab 05/23/12 0535  NA 144  K 4.2  CL 111  CO2 25  BUN 31*  CREATININE 0.85  CALCIUM 8.1*  MG --  PHOS --  GLUCOSE 105*    CBG (last 3)  No results found for this basename: GLUCAP:3 in the last 72 hours  Scheduled Meds:   . antiseptic oral rinse  1 application Mouth Rinse QID  . aspirin  81 mg Oral Daily  . atorvastatin  80 mg Per Tube q1800  . carvedilol  3.125 mg Oral BID WC  . chlorhexidine  15 mL Mouth/Throat BID  . feeding supplement  237 mL Oral BID BM  . furosemide  20 mg Oral Daily  . lisinopril  2.5 mg Oral Daily  . pantoprazole  40 mg Oral QAC breakfast  . polyethylene glycol  17 g Oral Daily  . spironolactone  12.5 mg Oral Daily  . Ticagrelor  90 mg Oral BID  . warfarin  2.5 mg Oral q1800  . Warfarin - Pharmacist Dosing Inpatient   Does not apply q1800    Continuous Infusions:   Oran Rein, RD, LDN Clinical Inpatient Dietitian Pager:  [phone] Weekend and after hours pager:  [phone]

## 2012-05-27 NOTE — Progress Notes (Signed)
Speech Language Pathology Daily Session Note  Patient Details  Name: Louis Reid MRN: [id number] Date of Birth: 1932/08/04  Today's Date: 05/27/2012 Time: 1330-1415 Time Calculation (min): 45 min  Short Term Goals: Week 1: SLP Short Term Goal 1 (Week 1): Pt will follow 3 step commands during functional tasks including home management,  ADLs with min question cues to improve information retention. SLP Short Term Goal 2 (Week 1): Pt will use memory aids (white board, memory book) to recall daily complex information and new information with maximum question cues.  SLP Short Term Goal 3 (Week 1): Pt will demonstrate selective attention in controlled environment for 20 minutes with min question cues.   SLP Short Term Goal 4 (Week 1): Pt will demonstrate emergent awareness in controlled environment to memory deficits with min question and verbal cues.    Skilled Therapeutic Interventions: Skilled treatment session targeted cognition goals during self-care tasks. SLP facilitated session with task of loading a medication pill box and external aid to assist with recall of new medications, function and frequency.  SLP provided max assist verbal, visual and demonstrative cues which SLP was able to fade to supervision verbal cues by end of session.  Patient demonstrated great self-monitoring by checking self without clinician cues.  Education completed during yesterday's session; patient and family ready for discharge tomorrow.     FIM:  Comprehension Comprehension Mode: Auditory Comprehension: 5-Understands complex 90% of the time/Cues < 10% of the time Expression Expression Mode: Verbal Expression: 6-Expresses complex ideas: With extra time/assistive device Social Interaction Social Interaction: 5-Interacts appropriately 90% of the time - Needs monitoring or encouragement for participation or interaction. Problem Solving Problem Solving: 4-Solves basic 75 - 89% of the time/requires cueing 10 -  24% of the time Memory Memory: 4-Recognizes or recalls 75 - 89% of the time/requires cueing 10 - 24% of the time FIM - Eating Eating Activity: 7: Complete independence:no helper  Pain Pain Assessment Pain Assessment: No/denies pain Pain Score: 0-No pain  Therapy/Group: Individual Therapy   Speech Language Pathology Discharge Summary  Patient Details  Name: Louis Reid MRN: [id number] Date of Birth: Sep 14, 1932  Today's Date: 05/27/2012  Patient has met 5 of 5 long term goals.  Patient to discharge at overall Supervision level.  Reasons goals not met: n/a   Clinical Impression/Discharge Summary: Patient met 5 out of 5 long term objective during CIR stay due to functional increase in use of compensatory strategies to compensate for memory, attention and awareness.  Patient initially demonstrated intellectual awareness of deficits; however, had difficulty compensating for them in the moment due to poor organizational strategies with attempts to use external aids.  SLP facilitated most of session with reinforcing organizational strategies along with use of external aids to assist with recall which patient embraced; as a result, he has made functional improvement that require supervision level assist from family upon discharge.  Family education with son has been completed and no further skilled SLP services are warranted at this time.    Care Partner:  Caregiver Able to Provide Assistance: Yes  Type of Caregiver Assistance: Cognitive  Recommendation:  24 hour supervision/assistance (until patient is familiar with routine again)      Equipment: none   Reasons for discharge: Treatment goals met;Discharged from hospital   Patient/Family Agrees with Progress Made and Goals Achieved: Yes   See FIM for current functional status  Charlane Ferretti., CCC-SLP [phone]  Rainn Zupko 05/27/2012, 4:18 PM

## 2012-05-28 LAB — URINE CULTURE

## 2012-05-28 NOTE — Progress Notes (Signed)
Left unit at 1030 tec pushed out in wheelchair . Son at his side. Went over medications with patient.

## 2012-05-28 NOTE — Progress Notes (Signed)
Patient ID: Louis Reid, male   DOB: [birth year], 78 y.o.   MRN: [id number] Subjective/Complaints: 78 y.o. male with history of CAD, recent admission for SBO with lysis of adhesions, who was admitted on 05/10/12 past witnessed cardiac arrest. Bystander CPR X 10 mins , AED shock X3. Was noted to be in atrial fibrillation and intubated in ED. He was taken to cath lab and underwent PTCA with thrombectomy of occluded LAD by Dr. Clifton James. He developed hematuria/bloody oral secretions with intermittent VT. Hypokalemia supplemented and integrelin discontinued. CT head done due to lethargy and showed no acute changes. Was started on IV antibiotics for leucocytosis. CT chest with tiny PE in lingula and CT abdomen with distended GB with mild ascites and diffuse body wall edema. Started on heparin drip for PE. BLE dopplers negative for DVT. Tolerated extubation 12/08 and encephalopathy slowly resolving. Diet initiated 12/9 due to improvement in mentation and no s/s of aspiration. LifeVest fitted   Pt recognizes some confusion,Tends to repeat questions Alert and Oriented x 3   Review of Systems  Constitutional: Negative for fever and chills.  Respiratory: Negative for cough and shortness of breath.   Cardiovascular: Negative for chest pain.  Gastrointestinal: Negative for diarrhea and constipation.  All other systems reviewed and are negative.    Objective: Vital Signs: Blood pressure 96/61, pulse 78, temperature 97.8 F (36.6 C), temperature source Oral, resp. rate 18, height 6' (1.829 m), weight 270 lb 11.6 oz (122.8 kg), SpO2 99.00%.   Thin, chronically ill appearing male, NAD Alert Chest- CTA    Assessment/Plan: 1. Functional deficits secondary to deconditioning , hypoxic encephalopathy should be ready for D/C 12/21 Stable for de/c Medical Problem List and Plan:  1.PE: Pharmaceutical: Coumadin  2. Pain Management: N/A  3. Mood: seems to be in good spirits.  4. Neuropsych: This patient is  capable of making decisions on his/her own behalf.  5. VF cardiac arrest: LifeVest in place.  6. NSTEMI s/p PTCA: continue ASA, Brilinta and coumadin for a month then ASA and coumadin  7. Pulmonary edema: check daily weights. 8. Recent SBO with lysis of adhesions: will schedule miralax to prevent constipation. Monitor and adjust bowel program as needed.  9. CAD: on coreg, lipitor, and lisinopril . Continue blood thinners for stent 10.  Insomnia 11.  Meatal reolved   LOS (Days) 8 A FACE TO FACE EVALUATION WAS PERFORMED  Brenlee Koskela HENRY 05/28/2012, 7:49 AM

## 2012-05-31 NOTE — Progress Notes (Signed)
Discharge summary # [id number]

## 2012-06-01 NOTE — Discharge Summary (Signed)
NAMEDAQUANN, MERRIOTT                ACCOUNT NO.:  [id number]  MEDICAL RECORD NO.:  [id number]  LOCATION:  4147                         FACILITY:  [facility]  PHYSICIAN:  Delle Reining, P.A.      DATE OF BIRTH:  1932-07-25  DATE OF ADMISSION:  05/20/2012 DATE OF DISCHARGE:  05/28/2012                              DISCHARGE SUMMARY   DISCHARGE DIAGNOSES: 1. Hypoxic encephalopathy. 2. Deconditioning due to cardiac arrest. 3. Non-ST elevated myocardial infarction status post percutaneous transluminal coronary angiography. 4. Recent small-bowel obstruction with lysis of adhesions. 5. Pulmonary edema resolved. 6. Insomnia.  HISTORY OF PRESENT ILLNESS:  [name] is a 78 year old male with history of coronary artery disease, recent admission for small-bowel obstruction requiring lysis of adhesions who was admitted on December 3,2013, past witnessed cardiac arrest, bystander CPR x10 minutes with AED shock x3.  He was noted to be in AFib, intubated in ED, and was taken to cath lab.  He underwent PTCA with thrombectomy of occluded LAD by Dr. Clifton James.  Postop, developed hematuria as well as bloody oral secretions with intermittent VT. Therefore,Integrilin was discontinued.  CT of head was done due to lethargy showing no acute changes.  He was started on IV antibiotics for leukocytosis.  CT of chest done showed tiny PE in the lingula and the patient was started on heparin drip for treatment of PE.  Bilateral lower extremity Dopplers are negative for DVT.  He  Tolerated extubation on December 8 and his encephalopathy is slowly resolving.  He was fitted with a LifeVest and is in process of being transitioned to Coumadin.  Therapy evaluations were done and rehab was recommended. He was then admitted to [facility] on May 20, 2012, for progressive therapies.  PAST MEDICAL HISTORY:  Positive for coronary artery disease, status post CABG, SA node dysfunction, hypertension, hyperlipidemia,  depression, renal calculi, inguinal hernia, osteoporosis, congenital funnel chest.  SOCIAL HISTORY:  Patient was independent and active prior to abdominal surgery.  FUNCTIONAL STATUS:  The patient was requiring mod assist for sit to stand, transfers mod assist ambulating 40 feet with a rolling walker. He required min assist for upper body bathing and dressing tasks, max assist for lower body care.  Min assist for toileting transfers.  HOSPITAL COURSE:  [name] was admitted to Rehab on May 20, 2012 for inpatient therapies to consist of PT, OT at least 3 hours 5 days a week.  Past admission, patient was maintained on aspirin, Brilinta, and Coumadin.  Recommendations are to continue these meds for 1 month, then decrease aspirin and Coumadin daily.  The patient's bowel program was adjusted to prevent any constipation issues.  Blood pressures were monitored on b.i.d. basis and these have been reasonably controlled.  Repeat labs done in past admission revealed H and H to be stable with hemoglobin at 9.3 to 9.5 range.  P.o. intake has been good. Patient's renal status has been monitored.  Last check of lytes from December 16 revealed sodium 144, potassium 4.2, chloride 111, CO2 of 25, BUN 31, creatinine 0.85, glucose 105.  PT/INR at the time of discharge were at 24.4 and 2.32.  The patient did have an  episode of meatal bleeding.  Urine culture was done and showed low colonies likely due to Colonization. He was followed along for any recurrent symptoms, and no complaints of dysuria or frequency reported.  The patient has been educated on use of LifeVest as well as battery changes every 12-24 hours.  During the patient's stay in rehab, weekly team conferences were held to monitor the patient's progress, discussed goals as well as barriers to discharge.  Speech Therapy has worked with patient on cognition goals. The patient was educated on use of memory aids to recall daily  complex information as well as strategies to follow multistep commands.  At the time of discharge, the patient has made functional improvement and requires supervision level for organization strategies as well as recall of the information.  No further speech therapy needs are noted past discharge.  OT has worked with the patient on self-care tasks.  The patient was at modified independent level for bathing and dressing.  He did require some cues for sequencing and problem solving.  Physical Therapy has worked with the patient on balance strengthening as well as mobility patient's progress to being at supervision level for transfers, supervision level for ambulating 155 + 225 feet.  His Berg balance score is at 50/56 indicating moderate risk for falls.  Supervision is recommended past discharge. further followup, home therapies to continue by advanced home care past discharge.  On May 28, 2012, the patient is discharged to home in improved condition.  DISCHARGE MEDICATIONS: 1. MiraLax 17 g p.o. per day. 2. Coumadin 2.5 mg p.o. q.6 p.m. 3. Aspirin 81 mg p.o. per day. 4. Lipitor 80 mg p.o. q. p.m. 5. Coreg 3.125 mg p.o. b.i.d. 6. Lasix 20 mg p.o. per day. 7. Lisinopril 2.5 mg p.o. per day. 8. Multivitamin 1 p.o. per day. 9. Protonix 40 mg p.o. b.i.d. 10.Spironolactone 25 mg half p.o. per day. 11.Ticagrelor two 90 mg p.o. b.i.d.  DIET:  Heart-healthy.  ACTIVITY LEVEL:  As tolerated with 24-hour supervision.  SPECIAL INSTRUCTIONS:  No alcohol.  No driving until cleared by Cardiology.  [facility] to provide PT, OT, speech therapy, and RN.  Rehab RN to draw Pro-Time on December 26 with results to Dr. Windle Guard.  FOLLOWUP:  The patient to follow up with Dr. Jeannetta Nap on December 30th at 10 a.m.  Follow up with Dr. Clifton James for recheck in the next few weeks. Follow up with Dr. Wynn Banker in 4 weeks.  Follow up with Dr. Lindie Spruce for postop check.     Delle Reining,  P.A.     PL/MEDQ  D:  05/31/2012  T:  06/01/2012  Job:  [id number]  cc:   Windle Guard, M.D. Verne Carrow, MD Cherylynn Ridges, M.D.

## 2012-06-03 ENCOUNTER — Encounter (HOSPITAL_COMMUNITY): Payer: Self-pay | Admitting: *Deleted

## 2012-06-03 ENCOUNTER — Emergency Department (HOSPITAL_COMMUNITY)
Admission: EM | Admit: 2012-06-03 | Discharge: 2012-06-04 | Disposition: A | Discharge status: Home / Self Care | Payer: Medicare Other | Attending: Emergency Medicine | Admitting: Emergency Medicine

## 2012-06-03 DIAGNOSIS — I10 Essential (primary) hypertension: Secondary | ICD-10-CM | POA: Insufficient documentation

## 2012-06-03 DIAGNOSIS — I252 Old myocardial infarction: Secondary | ICD-10-CM | POA: Insufficient documentation

## 2012-06-03 DIAGNOSIS — Z8679 Personal history of other diseases of the circulatory system: Secondary | ICD-10-CM | POA: Insufficient documentation

## 2012-06-03 DIAGNOSIS — Z7982 Long term (current) use of aspirin: Secondary | ICD-10-CM | POA: Insufficient documentation

## 2012-06-03 DIAGNOSIS — Z8659 Personal history of other mental and behavioral disorders: Secondary | ICD-10-CM | POA: Insufficient documentation

## 2012-06-03 DIAGNOSIS — I2510 Atherosclerotic heart disease of native coronary artery without angina pectoris: Secondary | ICD-10-CM | POA: Insufficient documentation

## 2012-06-03 DIAGNOSIS — Z951 Presence of aortocoronary bypass graft: Secondary | ICD-10-CM | POA: Insufficient documentation

## 2012-06-03 DIAGNOSIS — M810 Age-related osteoporosis without current pathological fracture: Secondary | ICD-10-CM | POA: Insufficient documentation

## 2012-06-03 DIAGNOSIS — Z87442 Personal history of urinary calculi: Secondary | ICD-10-CM | POA: Insufficient documentation

## 2012-06-03 DIAGNOSIS — Z79899 Other long term (current) drug therapy: Secondary | ICD-10-CM | POA: Insufficient documentation

## 2012-06-03 DIAGNOSIS — Z7901 Long term (current) use of anticoagulants: Secondary | ICD-10-CM | POA: Insufficient documentation

## 2012-06-03 DIAGNOSIS — R040 Epistaxis: Secondary | ICD-10-CM | POA: Insufficient documentation

## 2012-06-03 LAB — POCT I-STAT, CHEM 8
Chloride: 107 mEq/L (ref 96–112)
Glucose, Bld: 107 mg/dL — ABNORMAL HIGH (ref 70–99)
HCT: 32 % — ABNORMAL LOW (ref 39.0–52.0)
Potassium: 4.1 mEq/L (ref 3.5–5.1)

## 2012-06-03 LAB — PROTIME-INR: Prothrombin Time: 24.4 seconds — ABNORMAL HIGH (ref 11.6–15.2)

## 2012-06-03 MED ORDER — OXYMETAZOLINE HCL 0.05 % NA SOLN
1.0000 | Freq: Once | NASAL | Status: AC
  Administered 2012-06-03: 1 via NASAL
  Filled 2012-06-03: qty 15

## 2012-06-03 NOTE — ED Notes (Signed)
Repacked left nares with gauze after bleeding continued.

## 2012-06-03 NOTE — ED Notes (Signed)
Pt is currently taking coumadin SP MI 3 week ago. Pt reports nose bleed started 2 hrs ago.

## 2012-06-03 NOTE — ED Notes (Signed)
Pt  Denies any complaints at this time bleeding controlled.

## 2012-06-04 NOTE — ED Provider Notes (Signed)
History     CSN: [id number]  Arrival date & time 06/03/12  2011   None     Chief Complaint  Patient presents with  . Epistaxis    (Consider location/radiation/quality/duration/timing/severity/associated sxs/prior treatment) HPI History provided by pt.   78yo M on coumadin presents w/ intermittent, left-sided epistaxis since 5:30pm today.  Bleeding started immediately after blowing his nose, improves w/ holding pressure and supine positioning, but returns when he sits up.  No associated CP, SOB or lightheadedness.    Past Medical History  Diagnosis Date  . Acute myocardial infarction of anterolateral wall, episode of care unspecified   . Coronary atherosclerosis of artery bypass graft   . Sinoatrial node dysfunction   . HTN (hypertension)   . Depression   . History of kidney stones   . Congenital funnel chest   . Inguinal hernia   . Osteoporosis     Past Surgical History  Procedure Date  . Coronary artery bypass graft 1996  . Inguinal hernia repair 1996    right, hx  . Transurethral resection of prostate   . Ptca     percutaneous transluminal coronary intervention and brachy therapy, Bruce R. Juanda Chance, MD. EF 60%  . Laparotomy 04/27/2012    Procedure: EXPLORATORY LAPAROTOMY;  Surgeon: Cherylynn Ridges, MD;  Location: [facility] OR;  Service: General;  Laterality: N/A;  . Esophagogastroduodenoscopy 05/08/2012    Procedure: ESOPHAGOGASTRODUODENOSCOPY (EGD);  Surgeon: Charna Elizabeth, MD;  Location: [facility] ENDOSCOPY;  Service: Endoscopy;  Laterality: N/A;  Gunnar Fusi put on for Dr. Loreta Ave , Dr. Loreta Ave will call if she wants another time    Family History  Problem Relation Age of Onset  . Colon cancer Brother   . Heart disease Father   . Heart disease Brother   . Other Mother     brain tumor    History  Substance Use Topics  . Smoking status: Not on file  . Smokeless tobacco: Never Used     Comment: quit in the 1960's  . Alcohol Use: No      Review of Systems  All other systems reviewed  and are negative.    Allergies  Cold medicine plus  Home Medications   Current Outpatient Rx  Name  Route  Sig  Dispense  Refill  . ASPIRIN 81 MG PO TABS   Oral   Take 1 tablet (81 mg total) by mouth daily.         . ATORVASTATIN CALCIUM 80 MG PO TABS   Oral   Take 1 tablet (80 mg total) by mouth daily at 6 PM.   30 tablet   0   . CARVEDILOL 3.125 MG PO TABS   Oral   Take 1 tablet (3.125 mg total) by mouth 2 (two) times daily with a meal.   60 tablet   0   . FUROSEMIDE 20 MG PO TABS   Oral   Take 1 tablet (20 mg total) by mouth daily.   30 tablet   0   . LISINOPRIL 2.5 MG PO TABS   Oral   Take 1 tablet (2.5 mg total) by mouth daily.   30 tablet   0   . PANTOPRAZOLE SODIUM 40 MG PO TBEC   Oral   Take 1 tablet (40 mg total) by mouth 2 (two) times daily before a meal.   60 tablet   3   . POLYETHYLENE GLYCOL 3350 PO PACK   Oral   Take 17 g by mouth daily  as needed. For constipation         . SPIRONOLACTONE 12.5 MG HALF TABLET   Oral   Take 0.5 tablets (12.5 mg total) by mouth daily.   30 tablet   0   . TICAGRELOR 90 MG PO TABS   Oral   Take 1 tablet (90 mg total) by mouth 2 (two) times daily.   60 tablet   0   . WARFARIN SODIUM 2.5 MG PO TABS   Oral   Take 2.5 mg by mouth daily at 6 PM.           BP 101/58  Pulse 87  Temp 97.9 F (36.6 C) (Oral)  Resp 16  SpO2 100%  Physical Exam  Nursing note and vitals reviewed. Constitutional: He is oriented to person, place, and time. He appears well-developed and well-nourished. No distress.  HENT:  Head: Normocephalic and atraumatic.       No active epistaxis.  There is what appears to be a tiny superficial abrasion of anterior kiesselbach's plexus. No visible blood in posterior nostril.    Eyes:       Normal appearance  Neck: Normal range of motion.  Cardiovascular: Normal rate and regular rhythm.   Pulmonary/Chest: Effort normal and breath sounds normal. No respiratory distress.    Musculoskeletal: Normal range of motion.  Neurological: He is alert and oriented to person, place, and time.  Skin: Skin is warm and dry. No rash noted.  Psychiatric: He has a normal mood and affect. His behavior is normal.    ED Course  Procedures (including critical care time)  Labs Reviewed  PROTIME-INR - Abnormal; Notable for the following:    Prothrombin Time 24.4 (*)     INR 2.32 (*)     All other components within normal limits  POCT I-STAT, CHEM 8 - Abnormal; Notable for the following:    BUN 35 (*)     Glucose, Bld 107 (*)     Hemoglobin 10.9 (*)     HCT 32.0 (*)     All other components within normal limits  LAB REPORT - SCANNED   No results found.   1. Epistaxis       MDM  79yo M on coumadin presents w/ c/o epistaxis w/out associated sx.  No active bleeding on initial exam, but started shortly after.  Had patient blow nose, I applied afrin and then he held pressure for several minutes.  No bleeding at time of discharge.  INR 2.3 and hgb improved from prior.  Results discussed w/ pt.  Recommended that he continue the coumadin as prescribed and I explained what to do if he has a nosebleed in future.  Return precautions discussed.       Otilio Miu, [name] 06/04/12 [phone]

## 2012-06-07 LAB — CULTURE, BLOOD (ROUTINE X 2): Culture: NO GROWTH

## 2012-06-07 NOTE — ED Provider Notes (Signed)
Medical screening examination/treatment/procedure(s) were performed by non-physician practitioner and as supervising physician I was immediately available for consultation/collaboration.  Satvik Parco K Armanie Martine, MD 06/07/12 0826 

## 2012-06-09 ENCOUNTER — Other Ambulatory Visit (INDEPENDENT_AMBULATORY_CARE_PROVIDER_SITE_OTHER): Payer: Medicare Other

## 2012-06-09 ENCOUNTER — Ambulatory Visit (INDEPENDENT_AMBULATORY_CARE_PROVIDER_SITE_OTHER): Payer: Medicare Other | Admitting: Internal Medicine

## 2012-06-09 ENCOUNTER — Encounter: Payer: Self-pay | Admitting: Internal Medicine

## 2012-06-09 VITALS — BP 94/60 | HR 76 | Ht 68.25 in | Wt 120.0 lb

## 2012-06-09 DIAGNOSIS — K2210 Ulcer of esophagus without bleeding: Secondary | ICD-10-CM

## 2012-06-09 DIAGNOSIS — D649 Anemia, unspecified: Secondary | ICD-10-CM

## 2012-06-09 DIAGNOSIS — H1130 Conjunctival hemorrhage, unspecified eye: Secondary | ICD-10-CM

## 2012-06-09 LAB — CBC WITH DIFFERENTIAL/PLATELET
Basophils Absolute: 0 10*3/uL (ref 0.0–0.1)
HCT: 32 % — ABNORMAL LOW (ref 39.0–52.0)
Lymphocytes Relative: 19.6 % (ref 12.0–46.0)
Lymphs Abs: 1.1 10*3/uL (ref 0.7–4.0)
Monocytes Relative: 10.3 % (ref 3.0–12.0)
Neutrophils Relative %: 68.5 % (ref 43.0–77.0)
Platelets: 211 10*3/uL (ref 150.0–400.0)
RDW: 15.2 % — ABNORMAL HIGH (ref 11.5–14.6)
WBC: 5.4 10*3/uL (ref 4.5–10.5)

## 2012-06-09 NOTE — Patient Instructions (Addendum)
Your physician has requested that you go to the basement for the following lab work before leaving today: CBC/diff  Follow up with [place] in 2 months.  Thank you for choosing me and [facility].  Iva Boop, M.D., [facility]

## 2012-06-09 NOTE — Progress Notes (Signed)
Subjective:    Patient ID: DRAY DENTE, male    DOB: 01-12-1933, 79 y.o.   MRN: [id number]  HPI This elderly man presents for follow-up. He has been hospitalized twice since I last saw him. Once with SBO and ex lap. Then had acute coronary syndrome with vfib arrest. He underwent an EGD showing severe esophageal ulceration during one of these admissions. Since then has been on bid pantoprazole 40 mg. He has been recovering ok but in the past few days has noticed a bitter taste in his mouth with whatever he eats. Wonders if it is a medication or from reflux, but denies heartburn. While hospitalized he had an NG tube and was supine quite a bit. He had also been on osteoporosis therapy with alendronate.That was stopped. He is here with his nephew. Has seen PCP and INR checked but does not recall CBC being checked there. Was in ED about 7-10 days ago with epistaxis and Hgb 10.2. No bleeding since. Allergies  Allergen Reactions  . Cold Medicine Plus (Chlorphen-Pseudoephed-Apap)     COLD MEDICATIONS MAKE HIM "LOOPY" "Contact"   Outpatient Prescriptions Prior to Visit  Medication Sig Dispense Refill  . aspirin 81 MG tablet Take 1 tablet (81 mg total) by mouth daily.      Marland Kitchen atorvastatin (LIPITOR) 80 MG tablet Take 1 tablet (80 mg total) by mouth daily at 6 PM.  30 tablet  0  . carvedilol (COREG) 3.125 MG tablet Take 1 tablet (3.125 mg total) by mouth 2 (two) times daily with a meal.  60 tablet  0  . furosemide (LASIX) 20 MG tablet Take 1 tablet (20 mg total) by mouth daily.  30 tablet  0  . lisinopril (PRINIVIL,ZESTRIL) 2.5 MG tablet Take 1 tablet (2.5 mg total) by mouth daily.  30 tablet  0  . pantoprazole (PROTONIX) 40 MG tablet Take 1 tablet (40 mg total) by mouth 2 (two) times daily before a meal.  60 tablet  3  . polyethylene glycol (MIRALAX / GLYCOLAX) packet Take 17 g by mouth daily as needed. For constipation      . spironolactone (ALDACTONE) 12.5 mg TABS Take 0.5 tablets (12.5 mg total) by  mouth daily.  30 tablet  0  . Ticagrelor (BRILINTA) 90 MG TABS tablet Take 1 tablet (90 mg total) by mouth 2 (two) times daily.  60 tablet  0  . warfarin (COUMADIN) 2.5 MG tablet Take 2.5 mg by mouth daily at 6 PM.       Last reviewed on 06/09/2012  1:51 PM by Iva Boop, MD Past Medical History  Diagnosis Date  . Acute myocardial infarction of anterolateral wall, episode of care unspecified   . Coronary atherosclerosis of artery bypass graft   . Sinoatrial node dysfunction   . HTN (hypertension)   . Depression   . History of kidney stones   . Congenital funnel chest   . Inguinal hernia   . Osteoporosis   . Heart attack    Past Surgical History  Procedure Date  . Coronary artery bypass graft 1996  . Inguinal hernia repair 1996    right, hx  . Transurethral resection of prostate   . Ptca     percutaneous transluminal coronary intervention and brachy therapy, Bruce R. Juanda Chance, MD. EF 60%  . Laparotomy 04/27/2012    Procedure: EXPLORATORY LAPAROTOMY;  Surgeon: Cherylynn Ridges, MD;  Location: [facility] OR;  Service: General;  Laterality: N/A;  . Esophagogastroduodenoscopy 05/08/2012    Procedure:  ESOPHAGOGASTRODUODENOSCOPY (EGD);  Surgeon: Charna Elizabeth, MD;  Location: [facility] ENDOSCOPY;  Service: Endoscopy;  Laterality: N/A;  Gunnar Fusi put on for Dr. Loreta Ave , Dr. Loreta Ave will call if she wants another time  . Upper gastrointestinal endoscopy   . Colonoscopy    Review of Systems No eye pain or visual loss but has a small inferior subconjunctival hemorrhage left globe    Objective:   Physical Exam General:  NAD Eyes:   Anicteric, left inferior subconjuctival hemorrhage Lungs:  clear Heart:  S1S2 no rubs, murmurs or gallops Abdomen:  soft and nontender, BS+ Ext:   no edema    Data Reviewed:              ED note, EGD hospital records from last 2 admits       Assessment & Plan:   1. Esophagitis, erosive    2. Anemia  CBC with Differential  3. Subconjunctival hemorrhage, non-traumatic       1. Check cbc  2. Stay on bid PPI and off alendronate for now. Is on asa, ticagrelor and warfarin - so high risk for bleed 3. See me in 1-2 months. At that time may be able to go to qd PPI and possibly resume alendronate vs. Recommending IV osteoporosis Tx if cost-effective 4. See pcp or optho MD re: subconjunctival hemorrhage - and seek urgent attention if eye pain or visual loss  IO:NGEXBM,WUXLKG Joelene Millin, MD

## 2012-06-10 NOTE — Progress Notes (Signed)
Quick Note:  Blood count stable overall I recommend he take ferrous sulfate 325 mg daily ______

## 2012-06-13 ENCOUNTER — Encounter: Payer: Medicare Other | Attending: Physical Medicine & Rehabilitation

## 2012-06-13 ENCOUNTER — Inpatient Hospital Stay: Payer: Medicare Other | Admitting: Physical Medicine & Rehabilitation

## 2012-06-13 DIAGNOSIS — M810 Age-related osteoporosis without current pathological fracture: Secondary | ICD-10-CM | POA: Insufficient documentation

## 2012-06-13 DIAGNOSIS — X58XXXA Exposure to other specified factors, initial encounter: Secondary | ICD-10-CM | POA: Insufficient documentation

## 2012-06-13 DIAGNOSIS — I495 Sick sinus syndrome: Secondary | ICD-10-CM | POA: Insufficient documentation

## 2012-06-13 DIAGNOSIS — Z951 Presence of aortocoronary bypass graft: Secondary | ICD-10-CM | POA: Insufficient documentation

## 2012-06-13 DIAGNOSIS — G931 Anoxic brain damage, not elsewhere classified: Secondary | ICD-10-CM | POA: Insufficient documentation

## 2012-06-13 DIAGNOSIS — I25810 Atherosclerosis of coronary artery bypass graft(s) without angina pectoris: Secondary | ICD-10-CM | POA: Insufficient documentation

## 2012-06-13 DIAGNOSIS — I252 Old myocardial infarction: Secondary | ICD-10-CM | POA: Insufficient documentation

## 2012-06-13 DIAGNOSIS — Z9861 Coronary angioplasty status: Secondary | ICD-10-CM | POA: Insufficient documentation

## 2012-06-13 DIAGNOSIS — I10 Essential (primary) hypertension: Secondary | ICD-10-CM | POA: Insufficient documentation

## 2012-06-14 NOTE — Discharge Summary (Signed)
Agree with above 

## 2012-06-15 ENCOUNTER — Ambulatory Visit (INDEPENDENT_AMBULATORY_CARE_PROVIDER_SITE_OTHER): Payer: Medicare Other | Admitting: Nurse Practitioner

## 2012-06-15 ENCOUNTER — Encounter: Payer: Self-pay | Admitting: Nurse Practitioner

## 2012-06-15 VITALS — BP 80/60 | HR 72 | Ht 69.0 in | Wt 113.4 lb

## 2012-06-15 DIAGNOSIS — I10 Essential (primary) hypertension: Secondary | ICD-10-CM

## 2012-06-15 DIAGNOSIS — I469 Cardiac arrest, cause unspecified: Secondary | ICD-10-CM

## 2012-06-15 LAB — PROTIME-INR
INR: 3.1 ratio — ABNORMAL HIGH (ref 0.8–1.0)
Prothrombin Time: 32.2 s — ABNORMAL HIGH (ref 10.2–12.4)

## 2012-06-15 LAB — CBC WITH DIFFERENTIAL/PLATELET
Basophils Absolute: 0 10*3/uL (ref 0.0–0.1)
Basophils Relative: 0.4 % (ref 0.0–3.0)
Eosinophils Absolute: 0 10*3/uL (ref 0.0–0.7)
Eosinophils Relative: 0.4 % (ref 0.0–5.0)
HCT: 34.7 % — ABNORMAL LOW (ref 39.0–52.0)
Hemoglobin: 11.4 g/dL — ABNORMAL LOW (ref 13.0–17.0)
Lymphocytes Relative: 12.6 % (ref 12.0–46.0)
Lymphs Abs: 0.8 10*3/uL (ref 0.7–4.0)
MCHC: 32.8 g/dL (ref 30.0–36.0)
MCV: 96.1 fl (ref 78.0–100.0)
Monocytes Absolute: 0.6 10*3/uL (ref 0.1–1.0)
Monocytes Relative: 8.9 % (ref 3.0–12.0)
Neutro Abs: 5 10*3/uL (ref 1.4–7.7)
Neutrophils Relative %: 77.7 % — ABNORMAL HIGH (ref 43.0–77.0)
Platelets: 215 10*3/uL (ref 150.0–400.0)
RBC: 3.61 Mil/uL — ABNORMAL LOW (ref 4.22–5.81)
RDW: 14.9 % — ABNORMAL HIGH (ref 11.5–14.6)
WBC: 6.5 10*3/uL (ref 4.5–10.5)

## 2012-06-15 LAB — BASIC METABOLIC PANEL
BUN: 34 mg/dL — ABNORMAL HIGH (ref 6–23)
CO2: 29 mEq/L (ref 19–32)
Calcium: 8.8 mg/dL (ref 8.4–10.5)
Chloride: 103 mEq/L (ref 96–112)
Creatinine, Ser: 1.3 mg/dL (ref 0.4–1.5)
GFR: 56.84 mL/min — ABNORMAL LOW (ref >60.00)
Glucose, Bld: 95 mg/dL (ref 70–99)
Potassium: 4 mEq/L (ref 3.5–5.1)
Sodium: 139 mEq/L (ref 135–145)

## 2012-06-15 LAB — TSH: TSH: 3.08 u[IU]/mL (ref 0.35–5.50)

## 2012-06-15 NOTE — Patient Instructions (Addendum)
Stop Brilinta  Stay on your other medicines  We need to check labs today  We will get your LifeVest fixed today  See Dr. Daleen Squibb in 2 to 3 weeks  Call the [facility] Care office at [phone] if you have any questions, problems or concerns.

## 2012-06-15 NOTE — Progress Notes (Signed)
Louis Reid Date of Birth: February 15, 1933 Medical Record #[id number]  History of Present Illness: Louis Reid is seen today for a post hospital visit. He is seen for Louis Reid. He is a 79 year old male with known CAD, remote CABG who has suffered a cardiac arrest with NSTEMI. He has had PCI to an occluded LAD. Cath noted below. He has significant LV dysfunction. He has a LifeVest in place. CT of his chest showed tiny PE in the lingula, negative dopplers but placed on Coumadin. EF is 20%. His medicine plan was envisioned to be a short course of aspirin 81 mg + Brilinta + Coumadin approximately one month followed by Coumadin + aspirin 81 mg until he can come off Coumadin. His other issues are as noted below. He had had lysis adhesions back in November per Louis Reid for a SBO. He did require a stay in rehab.   He comes in today. He is here with his son, Louis Reid. He actually walked into the office today. Says he is getting a little stronger. No chest pain. Very little dyspnea. No swelling. No passing out. More worried about his bowels and his taste buds not working right. Started on iron for his blood count by Louis Reid. His weight is down. He does not like Ensure/Boost (too sweet). Seems constipated. Has had a nose bleed that required going back to the ER and getting Afrin. BP has been soft but he is not symptomatic. Has his Lifevest on but it is constantly alarming. No shocks.   Current Outpatient Prescriptions on File Prior to Visit  Medication Sig Dispense Refill  . aspirin 81 MG tablet Take 1 tablet (81 mg total) by mouth daily.      Marland Kitchen atorvastatin (LIPITOR) 80 MG tablet Take 1 tablet (80 mg total) by mouth daily at 6 PM.  30 tablet  0  . carvedilol (COREG) 3.125 MG tablet Take 1 tablet (3.125 mg total) by mouth 2 (two) times daily with a meal.  60 tablet  0  . ferrous sulfate 325 (65 FE) MG tablet Take 325 mg by mouth daily with breakfast.      . furosemide (LASIX) 20 MG tablet Take 1 tablet (20 mg  total) by mouth daily.  30 tablet  0  . lisinopril (PRINIVIL,ZESTRIL) 2.5 MG tablet Take 1 tablet (2.5 mg total) by mouth daily.  30 tablet  0  . pantoprazole (PROTONIX) 40 MG tablet Take 1 tablet (40 mg total) by mouth 2 (two) times daily before a meal.  60 tablet  3  . polyethylene glycol (MIRALAX / GLYCOLAX) packet Take 17 g by mouth daily as needed. For constipation      . warfarin (COUMADIN) 2.5 MG tablet Take 2.5 mg by mouth daily at 6 PM.        Allergies  Allergen Reactions  . Cold Medicine Plus (Chlorphen-Pseudoephed-Apap)     COLD MEDICATIONS MAKE HIM "LOOPY" "Contact"    Past Medical History  Diagnosis Date  . Acute myocardial infarction of anterolateral wall, episode of care unspecified     s/p remote CABG, STEMI in December 2013 with cardiac arrest; PTCA with thrombectomy of the LAD with hypoxic encephalopathy  . Coronary atherosclerosis of artery bypass graft   . Sinoatrial node dysfunction   . HTN (hypertension)   . Depression   . History of kidney stones   . Congenital funnel chest   . Inguinal hernia   . Osteoporosis   . SBO (small bowel obstruction)  November 2013    s/p lysis of adhesions  . Pectus excavatum     Past Surgical History  Procedure Date  . Coronary artery bypass graft 1996  . Inguinal hernia repair 1996    right, hx  . Transurethral resection of prostate   . Ptca     percutaneous transluminal coronary intervention and brachy therapy, Louis R. Juanda Chance, MD. EF 60%  . Laparotomy 04/27/2012    Procedure: EXPLORATORY LAPAROTOMY;  Surgeon: Louis Ridges, MD;  Location: [facility] OR;  Service: General;  Laterality: N/A;  . Esophagogastroduodenoscopy 05/08/2012    Procedure: ESOPHAGOGASTRODUODENOSCOPY (EGD);  Surgeon: Louis Elizabeth, MD;  Location: [facility] ENDOSCOPY;  Service: Endoscopy;  Laterality: N/A;  Louis Reid put on for Louis Reid , Louis Reid if she wants another time  . Upper gastrointestinal endoscopy   . Colonoscopy     History  Smoking status  .  Never Smoker   Smokeless tobacco  . Never Used    Comment: quit in the 1960's    History  Alcohol Use No    Family History  Problem Relation Age of Onset  . Colon cancer Brother   . Heart disease Father   . Heart disease Brother   . Other Mother     brain tumor    Review of Systems: The review of systems is per the HPI.  All other systems were reviewed and are negative.  Physical Exam: BP 80/60  Pulse 72  Ht 5\' 9"  (1.753 m)  Wt 113 lb 6.4 oz (51.438 kg)  BMI 16.75 kg/m2 Patient is very pleasant and in no acute distress. He is quite thin. Looks a little frail. Skin is warm and dry. Color is normal.  HEENT is unremarkable. Normocephalic/atraumatic. PERRL. Sclera are nonicteric. Neck is supple. No masses. No JVD. Lungs are clear. Cardiac exam shows a fairly regular rate and rhythm. Life vest is in place but alarming.  Abdomen is soft. Extremities are without any edema. Gait and ROM are intact. No gross neurologic deficits noted.  LABORATORY DATA: Pending  Coronary Angiographic Findings:  Left main: Mild plaque disease.  Left Anterior Descending Artery: Large caliber vessel with stents noted extending from the proximal vessel into the mid vessel. There is a total occlusion of the proximal LAD within the most proximal segment of the stent. There is no flow into the mid and distal vessel. The diagonal branch fills from the patent vein graft. The LIMA to the mid LAD is known to be atretic by cath in 2003 and was not engaged.  Circumflex Artery: 99% proximal subtotal occlusion, unchanged from cath in 2003. The first and second obtuse marginal branches fill from the patent vein graft.  Right Coronary Artery: Large, dominant artery with 60% ostial stenosis, 70% proximal stenosis and diffuse 50% disease in the mid vessel. The PDA is small in caliber. The posterolateral branch is moderate sized and has mild disease.  Graft Anatomy:  SVG sequential to OM1/OM2 is patent with mild  irregularities in the body of the graft and a prominent valve mid body of graft but no obstructive lesions.  SVG to diagonal is patent with mild irregularities in the mid body of the graft.  SVG to RCA is known to be occluded and was not engaged.  LIMA to mid LAD is known to be atretic and was not engaged.   Left Ventricular Angiogram: LVEF=20% with akinesis of the anterior wall and apex.   Impression:  1. Severe triple vessel CAD s/p  5V CABG in 1996 with 3/5 patent grafts and totally occluded proximal LAD (unprotected by graft).  2. Salvage PCI with balloon angioplasty and thrombectomy of the occluded LAD  3. Acute anterior STEMI secondary to occluded LAD  4. Cardiac arrest, out of hospital.  5. Severe LV systolic dysfunction   Recommendations: He will be admitted to the CCU under the care of the PCCM service. Cooling protocol. Will run Integrilin for 18 hours. Will load with Brilinta 180 mg tonight then will give 90 mg per NG BID. ASA/statin/beta blocker as tolerated. Poor prognosis. Discussed case at length with his son.   Complications: None. The patient tolerated the procedure well.  ------------------------------------------------------------ Echo Study Conclusions December 2013  - Left ventricle: The cavity size was normal. Wall thickness was normal. Systolic function was severely reduced. The estimated ejection fraction was in the range of 20% to 25%. There is akinesis of the anteroseptal, inferior, and apical myocardium. - Right ventricle: Systolic function was mildly reduced. - Pericardium, extracardiac: A trivial pericardial effusion was identified. There was a left pleural effusion.  Lab Results  Component Value Date   WBC 5.4 06/09/2012   HGB 10.4* 06/09/2012   HCT 32.0* 06/09/2012   PLT 211.0 06/09/2012   GLUCOSE 107* 06/03/2012   CHOL 136 07/07/2011   TRIG 83.0 07/07/2011   HDL 48.70 07/07/2011   LDLCALC 71 07/07/2011   ALT 33 05/23/2012   AST 28 05/23/2012   NA 143  06/03/2012   K 4.1 06/03/2012   CL 107 06/03/2012   CREATININE 1.20 06/03/2012   BUN 35* 06/03/2012   CO2 25 05/23/2012   TSH 9.927* 05/11/2012   INR 2.32* 06/03/2012   HGBA1C 5.9* 05/18/2012    Assessment / Plan: 1. S/P cardiac arrest/STEMI with PCI with thrombectomy/balloon angioplasty to an occluded LAD - procedure was December 3rd - I am stopping his Brilinta today. No other change in his medicines.   2. Hypoxic encephalopathy - he seems like he is back to his baseline  3. Recent SBO s/p lysis of adhesions - complaining of constipation. He is going to add Colace to his Miralax.   4. Severe LV dysfunction - EF is about 20% - he is on very low doses of his CHF meds. BP is too soft to titrate. Plan for repeat echo 3 months post MI to decide about ICD  5. PE - on coumadin - followed by Dr. Jeannetta Nap   6. Hypothyroidism - need to recheck  Overall, he is making progress. Nutrition is an issue. I have stopped his Brilinta. Check labs today. We have gotten his Lifevest adjusted and it has stopped alarming. See Louis Reid back in 2 to 3 weeks. No other changes in his medicines. Patient was seen by Louis Reid as well today who is in agreement with this plan.   Patient is agreeable to this plan and will Reid if any problems develop in the interim.

## 2012-06-17 ENCOUNTER — Telehealth: Payer: Self-pay | Admitting: Cardiology

## 2012-06-17 NOTE — Telephone Encounter (Signed)
Pt called for clarification regarding his Coumadin.  Reviewed lab results and there was no indication he was to stop Coumadin. Spoke with Duwayne Heck CMA she called pt this morning and it is clear that he was to hold his Coumadin today and restart it tomorrow (Saturday)  Called pt back and he will "HOLD" Coumadin today and restart it on Saturday ( tomorrow) He has appt with Dr. Jeannetta Nap on 06/22/12 for ov and Coumadin check.  Pt is taking 2.5mg  Coumadin daily  Follow-up appointment made with Herma Carson Pa.  Pt is aware  Reassurance given all questions answered Mylo Red RN

## 2012-06-17 NOTE — Telephone Encounter (Signed)
T/w pt today is suppose to skip coumadin dose today 2.5 mg only and suppose to f/u with pcp to get INR checked next week pt repeated instructions back to me and verbally understoon

## 2012-06-17 NOTE — Telephone Encounter (Signed)
Pt was in yesterday, was told by nurse to skip coumadin today, but he wants to clarify that he wasn't  to skip it until he sees his pcp , which appt hasnt been made yet, pls call [phone]

## 2012-06-20 LAB — PROTIME-INR: INR: 2.2 — AB (ref 0.9–1.1)

## 2012-06-28 ENCOUNTER — Ambulatory Visit (HOSPITAL_BASED_OUTPATIENT_CLINIC_OR_DEPARTMENT_OTHER): Payer: Medicare Other | Admitting: Physical Medicine & Rehabilitation

## 2012-06-28 ENCOUNTER — Encounter: Payer: Self-pay | Admitting: Physical Medicine & Rehabilitation

## 2012-06-28 VITALS — BP 107/69 | HR 71 | Ht 71.0 in | Wt 123.6 lb

## 2012-06-28 DIAGNOSIS — G931 Anoxic brain damage, not elsewhere classified: Secondary | ICD-10-CM

## 2012-06-28 NOTE — Progress Notes (Signed)
Subjective:    Patient ID: Louis Reid, male    DOB: 1933-04-08, 79 y.o.   MRN: [id number]  HPI Hospitalized at [facility] inpatient rehabilitation after suffering ventricular arrhythmia following small bowel obstruction surgery. Completed inpatient rehabilitation. Then received home health PT and OT. They have now discharged him with no recommendations for follow up therapy. Patient is independent with all his self-care and mobility. He has not returned to driving because we instructed him to get this clearance from cardiology. He follows up with cardiology later this month. His son stayed with the patient for 10 days when he first went home and now is there only intermittently. Pain Inventory Average Pain 0 Pain Right Now 0 My pain is no pain  In the last 24 hours, has pain interfered with the following? General activity 0 Relation with others 0 Enjoyment of life 0 What TIME of day is your pain at its worst? no pain Sleep (in general) Fair  Pain is worse with: no pain Pain improves with: no pain Relief from Meds: no pain  Mobility walk without assistance ability to climb steps?  yes do you drive?  no  Function retired  Neuro/Psych loss of taste or smell  Prior Studies Any changes since last visit?  no  Physicians involved in your care Any changes since last visit?  no   Family History  Problem Relation Age of Onset  . Colon cancer Brother   . Heart disease Father   . Heart disease Brother   . Other Mother     brain tumor   History   Social History  . Marital Status: Widowed    Spouse Name: N/A    Number of Children: 1  . Years of Education: N/A   Occupational History  . [name]    Social History Main Topics  . Smoking status: Never Smoker   . Smokeless tobacco: Never Used     Comment: quit in the 1960's  . Alcohol Use: No  . Drug Use: No  . Sexually Active: Not Currently   Other Topics Concern  . None   Social History Narrative    . None   Past Surgical History  Procedure Date  . Coronary artery bypass graft 1996  . Inguinal hernia repair 1996    right, hx  . Transurethral resection of prostate   . Ptca     percutaneous transluminal coronary intervention and brachy therapy, Bruce R. Juanda Chance, MD. EF 60%  . Laparotomy 04/27/2012    Procedure: EXPLORATORY LAPAROTOMY;  Surgeon: Cherylynn Ridges, MD;  Location: [facility] OR;  Service: General;  Laterality: N/A;  . Esophagogastroduodenoscopy 05/08/2012    Procedure: ESOPHAGOGASTRODUODENOSCOPY (EGD);  Surgeon: Charna Elizabeth, MD;  Location: [facility] ENDOSCOPY;  Service: Endoscopy;  Laterality: N/A;  Gunnar Fusi put on for Dr. Loreta Ave , Dr. Loreta Ave will call if she wants another time  . Upper gastrointestinal endoscopy   . Colonoscopy    Past Medical History  Diagnosis Date  . Acute myocardial infarction of anterolateral wall, episode of care unspecified     s/p remote CABG, STEMI in December 2013 with cardiac arrest; PTCA with thrombectomy of the LAD with hypoxic encephalopathy  . Coronary atherosclerosis of artery bypass graft   . Sinoatrial node dysfunction   . HTN (hypertension)   . Depression   . History of kidney stones   . Congenital funnel chest   . Inguinal hernia   . Osteoporosis   . SBO (small bowel obstruction)  November 2013    s/p lysis of adhesions  . Pectus excavatum    BP 107/69  Pulse 71  Ht 5\' 11"  (1.803 m)  Wt 123 lb 9.6 oz (56.065 kg)  BMI 17.24 kg/m2  SpO2 99%    Review of Systems  Constitutional:       Loss of taste   All other systems reviewed and are negative.       Objective:   Physical Exam Alert and oriented x3 Motor strength is 5/5 in bilateral deltoid, biceps, triceps, grip, hip flexor, knee extensors, ankle dorsi flexor plantar flexor Ambulation without toe drag or knee instability he is able to toe walk but is unable to do tandem gait. Mood and affect are normal       Assessment & Plan:  1. Mild anoxic encephalopathy following cardiac  arrest. He is now functionally independent other than not allowed to drive because of cardiac issues. I do not think he needs any further physical occupational or speech therapy. He'll need followup cardiology and PCP. PM and R. followup when necessary Discussed with patient and his son agree with the plan

## 2012-06-29 ENCOUNTER — Ambulatory Visit: Payer: Medicare Other | Admitting: Physician Assistant

## 2012-07-05 ENCOUNTER — Encounter: Payer: Self-pay | Admitting: Physician Assistant

## 2012-07-05 ENCOUNTER — Encounter: Payer: Medicare Other | Admitting: Physician Assistant

## 2012-07-05 ENCOUNTER — Ambulatory Visit (INDEPENDENT_AMBULATORY_CARE_PROVIDER_SITE_OTHER): Payer: Medicare Other | Admitting: General Surgery

## 2012-07-05 ENCOUNTER — Encounter (INDEPENDENT_AMBULATORY_CARE_PROVIDER_SITE_OTHER): Payer: Self-pay | Admitting: General Surgery

## 2012-07-05 ENCOUNTER — Ambulatory Visit (INDEPENDENT_AMBULATORY_CARE_PROVIDER_SITE_OTHER): Payer: Medicare Other | Admitting: Physician Assistant

## 2012-07-05 VITALS — BP 90/62 | HR 66 | Temp 98.4°F | Resp 16 | Ht 69.0 in | Wt 127.4 lb

## 2012-07-05 VITALS — BP 102/62 | HR 63 | Ht 69.0 in | Wt 124.8 lb

## 2012-07-05 DIAGNOSIS — Z09 Encounter for follow-up examination after completed treatment for conditions other than malignant neoplasm: Secondary | ICD-10-CM | POA: Insufficient documentation

## 2012-07-05 DIAGNOSIS — I2589 Other forms of chronic ischemic heart disease: Secondary | ICD-10-CM

## 2012-07-05 DIAGNOSIS — I25810 Atherosclerosis of coronary artery bypass graft(s) without angina pectoris: Secondary | ICD-10-CM

## 2012-07-05 DIAGNOSIS — I4901 Ventricular fibrillation: Secondary | ICD-10-CM

## 2012-07-05 DIAGNOSIS — I5032 Chronic diastolic (congestive) heart failure: Secondary | ICD-10-CM

## 2012-07-05 DIAGNOSIS — I5021 Acute systolic (congestive) heart failure: Secondary | ICD-10-CM

## 2012-07-05 DIAGNOSIS — I509 Heart failure, unspecified: Secondary | ICD-10-CM

## 2012-07-05 DIAGNOSIS — I5023 Acute on chronic systolic (congestive) heart failure: Secondary | ICD-10-CM

## 2012-07-05 LAB — BASIC METABOLIC PANEL
Chloride: 105 mEq/L (ref 96–112)
Potassium: 4.9 mEq/L (ref 3.5–5.1)
Sodium: 140 mEq/L (ref 135–145)

## 2012-07-05 MED ORDER — FUROSEMIDE 40 MG PO TABS: 40.0000 mg | ORAL_TABLET | Freq: Every day | ORAL | Status: DC

## 2012-07-05 NOTE — Progress Notes (Signed)
[address], [address] [place], [place]  [address] Phone: [phone], Fax:  [phone]  Date:  07/05/2012   ID:  Louis Reid, DOB [birth year], MRN [id number]  PCP:  [name], MD  Primary Cardiologist:  Dr. Valera Castle     History of Present Illness: Louis Reid is a 79 y.o. male who returns for follow up.  He has a hx of CAD, s/p CABG in 1996, status post brachytherapy to the LAD in 2003, HTN, HL. He was recently in the hospital 04/2012 with a small bowel obstruction. He had lysis of adhesions at that time. He was readmitted 12/3-12/13 with V. fib arrest in the setting of anterior STEMI treated with defibrillation and hypothermia protocol. LHC 05/10/12: Severe 3v CAD, S-OM1/2 ok, SVG-Dx ok, SVG-RCA occluded, LIMA-LAD atretic, proximal LAD occluded. EF 20% with anterior apical HK. Patient underwent salvage PCI with balloon angioplasty and thrombectomy of the occluded LAD. Echo 12/13: EF 20-25%, anteroseptal, inferior, apical HK, mildly reduced RV function, trivial pericardial effusion. He was also noted to have a pulmonary embolism. This was felt to be post op PE with recent surgery for SBO.  He was treated with aspirin and Brilinta and Coumadin. Plan was to eventually switch him over to Coumadin and aspirin only to reduce bleeding risk. He was discharged on a life vest. He was discharged to inpatient rehabilitation and remained there from 12/13-12/21. He saw Norma Fredrickson in followup 06/15/12. She stopped his blood Brilinta at that time. Blood pressure was too soft to titrate his medications. Plan was for the patient to see Dr. Daleen Squibb back in 2-3 weeks.   Patient doing well.  He comes in today with a multitude of questions. I tried to answer all of them for him. Denies significant dyspnea.  He is probably NYHA Class IIb.  No chest pain, syncope, orthopnea, PND.  He does note increasing ankle edema.  Still complains of problems with dysgeusia, but this is improving.     Labs (1/14):   K 4, BUN 34, creatinine 1.3, Hgb 11.4, TSH 3.08, INR 3.1  Wt Readings from Last 3 Encounters:  07/05/12 124 lb 12.8 oz (56.609 kg)  07/05/12 127 lb 6.4 oz (57.788 kg)  06/28/12 123 lb 9.6 oz (56.065 kg)     Past Medical History  Diagnosis Date  . CAD (coronary artery disease)     a. s/p remote CABG, b. AL STEMI c/b VF arrest in 05/2012 => PTCA with thrombectomy of the LAD c/b hypoxic encephalopathy  . Sinoatrial node dysfunction   . HTN (hypertension)   . Depression   . History of kidney stones   . Congenital funnel chest   . Inguinal hernia   . Osteoporosis   . SBO (small bowel obstruction) November 2013    s/p lysis of adhesions  . Pectus excavatum   . Anemia   . Clotting disorder   . Chronic systolic heart failure     a. Echo 12/13: EF 20-25%, anteroseptal, inferior, apical HK, mildly reduced RV function, trivial pericardial effusion  . Ischemic cardiomyopathy   . Pulmonary embolism     a. after adhesiolysis for SBO in 04/2012 => coumadin for 6 mos  . Hx of ventricular fibrillation     a. in setting of AL STEMI 12/13 => EF 20-25% => d/c on LifeVest (repeat echo planned in 08/2012)    Current Outpatient Prescriptions  Medication Sig Dispense Refill  . aspirin 81 MG tablet Take 1 tablet (81 mg  total) by mouth daily.      Marland Kitchen atorvastatin (LIPITOR) 80 MG tablet Take 1 tablet (80 mg total) by mouth daily at 6 PM.  30 tablet  0  . carvedilol (COREG) 3.125 MG tablet Take 1 tablet (3.125 mg total) by mouth 2 (two) times daily with a meal.  60 tablet  0  . docusate sodium (COLACE) 100 MG capsule Take 100 mg by mouth 2 (two) times daily.      . furosemide (LASIX) 20 MG tablet Take 1 tablet (20 mg total) by mouth daily.  30 tablet  0  . lisinopril (PRINIVIL,ZESTRIL) 2.5 MG tablet Take 1 tablet (2.5 mg total) by mouth daily.  30 tablet  0  . pantoprazole (PROTONIX) 40 MG tablet Take 1 tablet (40 mg total) by mouth 2 (two) times daily before a meal.  60 tablet  3  .  psyllium (METAMUCIL) 58.6 % powder Take 1 packet by mouth daily.      Marland Kitchen spironolactone (ALDACTONE) 25 MG tablet Take 25 mg by mouth daily.      Marland Kitchen warfarin (COUMADIN) 2.5 MG tablet Take 2.5 mg by mouth daily at 6 PM.        Allergies:    Allergies  Allergen Reactions  . Cold Medicine Plus (Chlorphen-Pseudoephed-Apap)     COLD MEDICATIONS MAKE HIM "LOOPY" "Contact"    Social History:  The patient  reports that he has never smoked. He has never used smokeless tobacco. He reports that he does not drink alcohol or use illicit drugs.   ROS:  Please see the history of present illness.      All other systems reviewed and negative.   PHYSICAL EXAM: VS:  BP 102/62  Pulse 63  Ht 5\' 9"  (1.753 m)  Wt 124 lb 12.8 oz (56.609 kg)  BMI 18.43 kg/m2 Well nourished, well developed, in no acute distress HEENT: normal Neck: + JVD Cardiac:  normal S1, S2; RRR; no murmur Lungs:  clear to auscultation bilaterally, no wheezing, rhonchi or rales Abd: soft, nontender, no hepatomegaly Ext: 1-2+ bilateral ankle edema Skin: warm and dry Neuro:  CNs 2-12 intact, no focal abnormalities noted  EKG:  NSR, HR 63, poor R wave progression with T wave inversions in V3-V6 (evolving AL MI), QTc 478     ASSESSMENT AND PLAN:  1. Acute on Chronic Systolic CHF:  Patient appears to be somewhat volume overloaded today. I have increased his Lasix to 40 mg daily. Check a basic metabolic panel today and repeat a basic metabolic panel in one week. Blood pressure still remains too soft to further titrate his CHF medications. Continue current dose of carvedilol, lisinopril and spironolactone. 2. Status Post V. Fib Arrest:  He remains on a LifeVest. He was questioning when he can return to driving. I recommended that he not drive for 6 months post MI. He will need a followup echocardiogram to reassess his LV function. This will be arranged in one month (90 days out from his MI and PCI). 3. Ischemic Cardiomyopathy:  As noted, repeat  echo in one month. If EF remains below 35%, refer to EP. 4. Pulmonary Embolism:  Coumadin is managed by his PCP. 5. Coronary Artery Disease:  As noted, he will need to remain on Coumadin for his pulmonary embolism and aspirin for CAD and recent MI. He has been taken off of Brilinta (30 days after his MI/PCI). Continue high-dose statin. 6. Small Bowel Obstruction:  Followup with Gen. Surgery. 7. Disposition:  Followup with  me in one month.  Luna Glasgow, [name]  12:07 PM 07/05/2012

## 2012-07-05 NOTE — Progress Notes (Signed)
The patient is status post exploratory laparotomy for small bowel obstruction. He did well from the surgery but postoperatively had a massive cardiac event requiring CPR. He subsequently had multiple neurologic deficits.  The patient has recovered through rehabilitation and is doing well now.  On examination his abdominal wound is well-healed with no evidence of infection or hernia. He is eating well although his appetite is not quite what it used to be he has gained 6 pounds in the last 2 weeks. He is return to see me as needed. Otherwise he is doing well and I can discharge her from clinic. He will see Dr. Valera Castle today for continued cardiac followup.

## 2012-07-05 NOTE — Patient Instructions (Addendum)
Your physician recommends that you return for lab work in: today (bmet) and in one week at your primary care physician's office   Your physician has requested that you have an echocardiogram same day as next appt with Tereso Newcomer. Echocardiography is a painless test that uses sound waves to create images of your heart. It provides your doctor with information about the size and shape of your heart and how well your heart's chambers and valves are working. This procedure takes approximately one hour. There are no restrictions for this procedure.  Your physician recommends that you schedule a follow-up appointment on:  March 7 with Tereso Newcomer  Your physician has recommended you make the following change in your medication: INCREASE your Lasix to 40 mg daily

## 2012-07-06 ENCOUNTER — Telehealth: Payer: Self-pay | Admitting: *Deleted

## 2012-07-06 NOTE — Telephone Encounter (Signed)
Notified patient to continue current treatment plan. Verified upcoming appts. Reviewed meds and August 12, 2012 plans for Echocardiogram. Pt agreed.

## 2012-07-06 NOTE — Telephone Encounter (Signed)
Message copied by Prescilla Sours on Wed Jul 06, 2012  2:49 PM ------      Message from: [place], [place] T      Created: Tue Jul 05, 2012  4:45 PM       Potassium and kidney function look good.      Continue with current treatment plan.      Tereso Newcomer, [name]  4:49 PM 04/21/2012

## 2012-07-08 ENCOUNTER — Telehealth (HOSPITAL_COMMUNITY): Payer: Self-pay | Admitting: Cardiac Rehabilitation

## 2012-07-08 NOTE — Telephone Encounter (Signed)
Lm for son [name] to call to discuss cardiac rehab

## 2012-07-12 ENCOUNTER — Encounter: Payer: Self-pay | Admitting: Physician Assistant

## 2012-07-18 ENCOUNTER — Telehealth: Payer: Self-pay | Admitting: *Deleted

## 2012-07-18 ENCOUNTER — Telehealth: Payer: Self-pay | Admitting: Cardiovascular Disease

## 2012-07-18 NOTE — Telephone Encounter (Signed)
Message copied by Clint Guy on Mon Jul 18, 2012 12:29 PM ------      Message from: Erle Crocker D      Created: Mon Jul 18, 2012 12:25 PM       Note To Self....            Pt Called me Monday and Asked once papers are Signed By      Provider, I mail back to Him at his Home address, He also asked      That I make an Extra Copy Of the papers and Mail along with      [employer].              ------

## 2012-07-18 NOTE — Telephone Encounter (Signed)
Message copied by Tarri Fuller on Mon Jul 18, 2012  5:30 PM ------      Message from: [place], [place] T      Created: Mon Jul 18, 2012  5:14 PM       K 4.2, creatinine 1.05      Potassium and kidney function look good.      Continue with current treatment plan.      Tereso Newcomer, [name]  4:49 PM 04/21/2012 ------

## 2012-07-18 NOTE — Telephone Encounter (Signed)
pt notified about 07/12/12 lab results that were done at [facility], pt verbalized understanding

## 2012-07-21 ENCOUNTER — Other Ambulatory Visit: Payer: Self-pay | Admitting: Physical Medicine & Rehabilitation

## 2012-07-28 ENCOUNTER — Telehealth: Payer: Self-pay | Admitting: *Deleted

## 2012-07-28 NOTE — Telephone Encounter (Signed)
I have called pt to inform him we have received his [employer] insurance papers. These would need to be signed by his primary cardiologist who will be back in the office on 08/12/12 ( Dr. Daleen Squibb)  I was unable to leave a message as there did not appear to be an answering machine. Mylo Red RN

## 2012-08-05 NOTE — Telephone Encounter (Signed)
Follow up    Pt stated he need to talk to you today. Pt calling back concerning previous message.

## 2012-08-05 NOTE — Telephone Encounter (Signed)
Pt calls b/c he has Increased swelling bilateral legs with edema settling more in bilateral knees.   Denies any shortness of breath or chest discomfort.  States that he is feeling good at this time. Just concerned about the swelling in his knees. States his appetite had improved & he has been watching his salt & fat intake.    His weight today is =130.   Weight at last ov on 07/05/12 was 124 ( furosemide was increased to 40mg  daily at that time)  Advised he could take an extra 1/2 dose of lasix. Pt does not want to take extra lasix at this time. B/C he is "afraid my blood pressure will drop too low" (07/05/12  bp 102/62)  He is wearing his lifewatch vest and has had no problems. Echo & ov appts are 08/12/12.  Offered 3/5 appt with PA but pt declined at this time  Pt will call office back if swelling increases/ weight increases. Mylo Red RN

## 2012-08-05 NOTE — Telephone Encounter (Signed)
New Prob    Pt would like to speak to nurse about some swelling he is having and some forms he needs.

## 2012-08-09 ENCOUNTER — Encounter: Payer: Self-pay | Admitting: Family Medicine

## 2012-08-11 ENCOUNTER — Other Ambulatory Visit (HOSPITAL_COMMUNITY): Payer: Medicare Other

## 2012-08-12 ENCOUNTER — Ambulatory Visit: Payer: Medicare Other | Admitting: Physician Assistant

## 2012-08-12 ENCOUNTER — Other Ambulatory Visit (HOSPITAL_COMMUNITY): Payer: Medicare Other

## 2012-08-18 ENCOUNTER — Other Ambulatory Visit: Payer: Self-pay

## 2012-08-18 ENCOUNTER — Ambulatory Visit (HOSPITAL_COMMUNITY): Payer: Medicare Other | Attending: Cardiology | Admitting: Radiology

## 2012-08-18 ENCOUNTER — Encounter: Payer: Self-pay | Admitting: Physician Assistant

## 2012-08-18 ENCOUNTER — Ambulatory Visit (INDEPENDENT_AMBULATORY_CARE_PROVIDER_SITE_OTHER): Payer: Medicare Other | Admitting: Physician Assistant

## 2012-08-18 VITALS — BP 122/70 | HR 60 | Ht 69.0 in | Wt 141.0 lb

## 2012-08-18 DIAGNOSIS — I5023 Acute on chronic systolic (congestive) heart failure: Secondary | ICD-10-CM

## 2012-08-18 DIAGNOSIS — I2699 Other pulmonary embolism without acute cor pulmonale: Secondary | ICD-10-CM

## 2012-08-18 DIAGNOSIS — I4901 Ventricular fibrillation: Secondary | ICD-10-CM

## 2012-08-18 DIAGNOSIS — E785 Hyperlipidemia, unspecified: Secondary | ICD-10-CM | POA: Insufficient documentation

## 2012-08-18 DIAGNOSIS — I2589 Other forms of chronic ischemic heart disease: Secondary | ICD-10-CM

## 2012-08-18 DIAGNOSIS — R0602 Shortness of breath: Secondary | ICD-10-CM

## 2012-08-18 DIAGNOSIS — I25810 Atherosclerosis of coronary artery bypass graft(s) without angina pectoris: Secondary | ICD-10-CM

## 2012-08-18 DIAGNOSIS — I10 Essential (primary) hypertension: Secondary | ICD-10-CM

## 2012-08-18 MED ORDER — FUROSEMIDE 40 MG PO TABS: 40.0000 mg | ORAL_TABLET | Freq: Two times a day (BID) | ORAL | Status: DC

## 2012-08-18 NOTE — Progress Notes (Signed)
Echocardiogram performed.  

## 2012-08-18 NOTE — Patient Instructions (Addendum)
Your physician has recommended you make the following change in your medication: increase Furosemide to 40 mg twice daily and take Protonix for 3 more days then decrease to every other day for 3 days then stop taking.  Your physician recommends that you return for lab work in: today and in 1 week  Your Juline Sanderford recommends that you start a low sodium diet. Please see handout given to you at today's visit  Your physician recommends that you schedule a follow-up appointment in: April 2 with Dr. Johney Frame

## 2012-08-18 NOTE — Progress Notes (Signed)
[address] [place], [place]  [address] Phone: [phone], Fax:  [phone]  Date:  08/18/2012   ID:  Louis Reid, DOB 09-Feb-1933, MRN [id number]  PCP:  [name], MD  Primary Cardiologist:  Dr. Valera Castle     History of Present Illness: Louis Reid is a 79 y.o. male who returns for follow up.  He has a hx of CAD, s/p CABG in 1996, s/p brachytherapy to the LAD in 2003, HTN, HL. He was admitted 04/2012 with a SBO and required lysis of adhesions. He was then readmitted 12/3-12/13 with VF arrest in the setting of ant STEMI treated with defibrillation and hypothermia protocol. Cardiac cath demonstrated an atretic L-LAD and pLAD was occluded. EF 20% with anterior apical HK. Patient underwent salvage PCI withPOBA and thrombectomy of the occluded LAD. Echo 12/13: EF 20-25%, anteroseptal, inferior, apical HK.  He was also dx with a pulmonary embolism. This was felt to be a post op PE with recent surgery for SBO.  He was treated with aspirin and Brilinta and Coumadin. Brilinta was stopped in 1/14 after 30 days of DAPT.  I saw him in followup 1/14. His volume appeared to be increased and I increased his Lasix. Blood pressure remained too soft to further titrate his CHF medications. He remained on his life vest. Patient was brought back today for follow up echocardiogram to reassess his LV function.  He is doing ok.  Denies significant DOE.  He is probably NYHA Class II-IIb. No orthopnea, PND.  He does note increased LE edema.  No cough.  No chest pain. No syncope.  No Rx from his LifeVest.  Echo today demonstrates continued LV dysfunction with EF 25%.  Labs (1/14):   K 4 => 4.9, BUN 34, creatinine 1.3 => 1.1, Hgb 11.4, TSH 3.08, INR 3.1  Wt Readings from Last 3 Encounters:  08/18/12 141 lb (63.957 kg)  07/05/12 124 lb 12.8 oz (56.609 kg)  07/05/12 127 lb 6.4 oz (57.788 kg)     Past Medical History  Diagnosis Date  . CAD (coronary artery disease)    a. s/p remote CABG, b. AL STEMI c/b VF arrest in 05/2012 => LHC 05/10/12: Severe 3v CAD, S-OM1/2 ok, SVG-Dx ok, SVG-RCA occluded, LIMA-LAD atretic, proximal LAD occluded. EF 20% with anterior apical HK=> salvage PCI with POBA and thrombectomy of the occluded LAD;  c/b hypoxic encephalopathy  . Sinoatrial node dysfunction   . HTN (hypertension)   . Depression   . History of kidney stones   . Congenital funnel chest   . Inguinal hernia   . Osteoporosis   . SBO (small bowel obstruction) November 2013    s/p lysis of adhesions  . Pectus excavatum   . Anemia   . Clotting disorder   . Chronic systolic heart failure     a. Echo 12/13: EF 20-25%, anteroseptal, inferior, apical HK, mildly reduced RV function, trivial pericardial effusion;   b.  Echo 3/14:  Ant, septal, apical AK, EF 25%  . Ischemic cardiomyopathy   . Pulmonary embolism     a. after adhesiolysis for SBO in 04/2012 => coumadin for 6 mos  . Hx of ventricular fibrillation     a. in setting of AL STEMI 12/13 => EF 20-25% => d/c on LifeVest (repeat echo planned in 08/2012)    Current Outpatient Prescriptions  Medication Sig Dispense Refill  . aspirin 81 MG tablet Take 1 tablet (81 mg total) by mouth daily.      Marland Kitchen  atorvastatin (LIPITOR) 80 MG tablet Take 1 tablet (80 mg total) by mouth daily at 6 PM.  30 tablet  0  . carvedilol (COREG) 3.125 MG tablet Take 1 tablet (3.125 mg total) by mouth 2 (two) times daily with a meal.  60 tablet  0  . docusate sodium (COLACE) 100 MG capsule Take 100 mg by mouth 2 (two) times daily.      . furosemide (LASIX) 40 MG tablet Take 1 tablet (40 mg total) by mouth daily.  30 tablet  6  . lisinopril (PRINIVIL,ZESTRIL) 2.5 MG tablet Take 1 tablet (2.5 mg total) by mouth daily.  30 tablet  0  . pantoprazole (PROTONIX) 40 MG tablet Take 1 tablet (40 mg total) by mouth 2 (two) times daily before a meal.  60 tablet  3  . psyllium (METAMUCIL) 58.6 % powder Take 1 packet by mouth daily.      Marland Kitchen spironolactone  (ALDACTONE) 25 MG tablet Take 25 mg by mouth daily.      Marland Kitchen warfarin (COUMADIN) 2.5 MG tablet Take 2.5 mg by mouth daily at 6 PM.       No current facility-administered medications for this visit.    Allergies:    Allergies  Allergen Reactions  . Cold Medicine Plus (Chlorphen-Pseudoephed-Apap)     COLD MEDICATIONS MAKE HIM "LOOPY" "Contact"    Social History:  The patient  reports that he has never smoked. He has never used smokeless tobacco. He reports that he does not drink alcohol or use illicit drugs.   ROS:  Please see the history of present illness.  All other systems reviewed and negative.   PHYSICAL EXAM: VS:  BP 122/70  Pulse 60  Ht 5\' 9"  (1.753 m)  Wt 141 lb (63.957 kg)  BMI 20.81 kg/m2 Well nourished, well developed, in no acute distress HEENT: normal Neck: + JVD Cardiac:  normal S1, S2; RRR; no murmur Lungs:  clear to auscultation bilaterally, no wheezing, rhonchi or rales Abd: soft, nontender, no hepatomegaly Ext: 1-2+ bilateral LE edema up to his thigh Skin: warm and dry Neuro:  CNs 2-12 intact, no focal abnormalities noted  EKG:  NSR, HR 60, poor R wave progression with T wave inversions in V3-V6, QTc 442     ASSESSMENT AND PLAN:  1. A/C Systolic CHF:  He continues to have chronic volume overload.  I will increase his Lasix to 40 mg bid.  Check a bmet and bnp today and repeat bmet in 1 week.  BP is better.  But, since I am increasing his Lasix, I will hold off on adjusting his Lisinopril.  He is being referred to EP.  If he cannot get in to be seen with EP in the next 2-3 weeks, he will follow up with me to recheck his volume status.  2. Status Post V. Fib Arrest:  He remains on a LifeVest.  He will be referred to EP. 3. Ischemic Cardiomyopathy:  EF still < 35% 90 days after his MI and PCI.  He will be referred to EP for consideration of ICD +/- CRT.  Continue current dose of coreg, lisinopril, spironolactone. 4. Pulmonary Embolism:  Coumadin is managed by his  PCP.  I suspect he will only need to be on coumadin for total of 6 mos as his PE occurred in the context of abdominal surgery. 5. Coronary Artery Disease:  He is on Coumadin for his pulmonary embolism and aspirin for CAD and recent MI. He has already been taken  off of Brilinta (30 days after his MI/PCI). Continue high-dose statin.  Stable without angina. 6. Disposition:  He will be referred to EP.  Follow up with me if he cannot get in to be seen in the next 2-3 weeks.   Luna Glasgow, [name]  3:34 PM 08/18/2012

## 2012-08-19 LAB — BASIC METABOLIC PANEL
CO2: 28 mEq/L (ref 19–32)
GFR: 59.99 mL/min — ABNORMAL LOW (ref >60.00)
Glucose, Bld: 97 mg/dL (ref 70–99)
Potassium: 4.2 mEq/L (ref 3.5–5.1)
Sodium: 141 mEq/L (ref 135–145)

## 2012-08-19 NOTE — Addendum Note (Signed)
Addended by: Jarvis Newcomer on: 08/19/2012 09:35 AM   Modules accepted: Orders

## 2012-08-22 ENCOUNTER — Telehealth: Payer: Self-pay | Admitting: *Deleted

## 2012-08-22 NOTE — Addendum Note (Signed)
Addended by: Tarri Fuller on: 08/22/2012 09:03 AM   Modules accepted: Orders

## 2012-08-22 NOTE — Telephone Encounter (Signed)
pt notified about lab results with verbal understanding  

## 2012-08-22 NOTE — Telephone Encounter (Signed)
Message copied by Tarri Fuller on Mon Aug 22, 2012  8:56 AM ------      Message from: [place], [place] T      Created: Fri Aug 19, 2012  1:11 PM       Potassium and kidney function look good.      Continue with current treatment plan.      Tereso Newcomer, [name]  4:49 PM 04/21/2012 ------

## 2012-08-25 ENCOUNTER — Other Ambulatory Visit (INDEPENDENT_AMBULATORY_CARE_PROVIDER_SITE_OTHER): Payer: Medicare Other

## 2012-08-25 DIAGNOSIS — I10 Essential (primary) hypertension: Secondary | ICD-10-CM

## 2012-08-25 LAB — BASIC METABOLIC PANEL
BUN: 34 mg/dL — ABNORMAL HIGH (ref 6–23)
Calcium: 8.5 mg/dL (ref 8.4–10.5)
GFR: 54.86 mL/min — ABNORMAL LOW (ref >60.00)
Glucose, Bld: 126 mg/dL — ABNORMAL HIGH (ref 70–99)
Potassium: 4.2 mEq/L (ref 3.5–5.1)

## 2012-09-07 ENCOUNTER — Encounter: Payer: Self-pay | Admitting: Internal Medicine

## 2012-09-07 ENCOUNTER — Ambulatory Visit (INDEPENDENT_AMBULATORY_CARE_PROVIDER_SITE_OTHER): Payer: Medicare Other | Admitting: Internal Medicine

## 2012-09-07 ENCOUNTER — Encounter: Payer: Self-pay | Admitting: *Deleted

## 2012-09-07 VITALS — BP 94/58 | HR 92 | Ht 71.0 in | Wt 139.4 lb

## 2012-09-07 DIAGNOSIS — I4901 Ventricular fibrillation: Secondary | ICD-10-CM

## 2012-09-07 DIAGNOSIS — I255 Ischemic cardiomyopathy: Secondary | ICD-10-CM

## 2012-09-07 DIAGNOSIS — I2589 Other forms of chronic ischemic heart disease: Secondary | ICD-10-CM

## 2012-09-07 NOTE — Patient Instructions (Addendum)

## 2012-09-08 ENCOUNTER — Other Ambulatory Visit: Payer: Self-pay | Admitting: *Deleted

## 2012-09-08 ENCOUNTER — Telehealth: Payer: Self-pay | Admitting: Internal Medicine

## 2012-09-08 ENCOUNTER — Encounter (HOSPITAL_COMMUNITY): Payer: Self-pay | Admitting: Pharmacy Technician

## 2012-09-08 DIAGNOSIS — I519 Heart disease, unspecified: Secondary | ICD-10-CM

## 2012-09-08 DIAGNOSIS — I4901 Ventricular fibrillation: Secondary | ICD-10-CM

## 2012-09-08 LAB — BASIC METABOLIC PANEL
CO2: 29 mEq/L (ref 19–32)
Calcium: 8.4 mg/dL (ref 8.4–10.5)
Creatinine, Ser: 1.2 mg/dL (ref 0.4–1.5)
GFR: 59.99 mL/min — ABNORMAL LOW (ref >60.00)
Sodium: 135 mEq/L (ref 135–145)

## 2012-09-08 LAB — CBC WITH DIFFERENTIAL/PLATELET
Basophils Relative: 1.6 % (ref 0.0–3.0)
Eosinophils Absolute: 0.2 10*3/uL (ref 0.0–0.7)
Eosinophils Relative: 2.5 % (ref 0.0–5.0)
Hemoglobin: 9.6 g/dL — ABNORMAL LOW (ref 13.0–17.0)
Lymphocytes Relative: 14.5 % (ref 12.0–46.0)
Monocytes Relative: 9.3 % (ref 3.0–12.0)
Neutro Abs: 4.8 10*3/uL (ref 1.4–7.7)
Neutrophils Relative %: 72.1 % (ref 43.0–77.0)
RBC: 3.14 Mil/uL — ABNORMAL LOW (ref 4.22–5.81)
WBC: 6.7 10*3/uL (ref 4.5–10.5)

## 2012-09-08 LAB — PROTIME-INR: INR: 2.2 ratio — ABNORMAL HIGH (ref 0.8–1.0)

## 2012-09-08 NOTE — Telephone Encounter (Signed)
Patient aware INR 2.2  Continue Coumadin as directd.  He is going to contact Dr Jeannetta Nap  I tried and the office is closed

## 2012-09-08 NOTE — Telephone Encounter (Signed)
New problem   Call pt concerning his coumadin results and ICD for next week

## 2012-09-09 ENCOUNTER — Encounter: Payer: Self-pay | Admitting: Internal Medicine

## 2012-09-09 DIAGNOSIS — I255 Ischemic cardiomyopathy: Secondary | ICD-10-CM | POA: Insufficient documentation

## 2012-09-09 NOTE — Assessment & Plan Note (Signed)
The patient has an ischemic CM (EF 25%), NYHA Class II/III CHF, and CAD. At this time, he meets MADIT II/ SCD-HeFT criteria for ICD implantation for primary prevention of sudden death.  He has had prior VF arrest and is at risk for recurrence.  Risks, benefits, alternatives to ICD implantation were discussed in detail with the patient today. The patient  understands that the risks include but are not limited to bleeding, infection, pneumothorax, perforation, tamponade, vascular damage, renal failure, MI, stroke, death, inappropriate shocks, and lead dislodgement and wishes to proceed.  We will therefore schedule device implantation at the next available time.  I also discussed the Analyze ST study as an option.  He will speak with our research team.

## 2012-09-09 NOTE — Progress Notes (Signed)
 Primary Care Physician: ELKINS,WILSON OLIVER, MD Referring Physician:  Dr Wall/ Scott Weaver   Louis Reid is a 79 y.o. male with a h/o CAD, s/p CABG in 1996, s/p brachytherapy to the LAD in 2003, HTN, HL and VF arrest 12/13 in the setting of anterior STEMI requiring emergent PCI. He was admitted 04/2012 with a SBO and required lysis of adhesions. He was then readmitted 12/3-12/13 with VF arrest in the setting of ant STEMI treated with defibrillation and hypothermia protocol. Cardiac cath demonstrated an atretic L-LAD and pLAD was occluded. EF 20% with anterior apical HK.  He underwent salvage PCI with POBA and thrombectomy of the occluded LAD. He was also dx with a pulmonary embolism. This was felt to be a post op PE with recent surgery for SBO.  He was treated with aspirin and Brilinta and Coumadin. Brilinta was stopped in 1/14 after 30 days of DAPT. He has been treated with an optimal medical regimen.  Further medical management is limited by hypotension at this point.  He is also wearing a lifevest.  Today, he denies symptoms of palpitations, chest pain,  orthopnea, PND,  dizziness, presyncope, syncope, or neurologic sequela.  He has SOB which is much worse with inclines.  He also has frequent edema. The patient is tolerating medications without difficulties and is otherwise without complaint today.   Past Medical History  Diagnosis Date  . CAD (coronary artery disease)     a. s/p remote CABG, b. AL STEMI c/b VF arrest in 05/2012 => LHC 05/10/12: Severe 3v CAD, S-OM1/2 ok, SVG-Dx ok, SVG-RCA occluded, LIMA-LAD atretic, proximal LAD occluded. EF 20% with anterior apical HK=> salvage PCI with POBA and thrombectomy of the occluded LAD;  c/b hypoxic encephalopathy  . Sinoatrial node dysfunction   . HTN (hypertension)   . Depression   . History of kidney stones   . Congenital funnel chest   . Inguinal hernia   . Osteoporosis   . SBO (small bowel obstruction) November 2013    s/p lysis of  adhesions  . Pectus excavatum   . Anemia   . Clotting disorder   . Chronic systolic heart failure     a. Echo 12/13: EF 20-25%, anteroseptal, inferior, apical HK, mildly reduced RV function, trivial pericardial effusion;   b.  Echo 3/14:  Ant, septal, apical AK, EF 25%  . Ischemic cardiomyopathy   . Pulmonary embolism     a. after adhesiolysis for SBO in 04/2012 => coumadin for 6 mos  . Hx of ventricular fibrillation     a. in setting of AL STEMI 12/13 => EF 20-25% => d/c on LifeVest (repeat echo planned in 08/2012)   Past Surgical History  Procedure Laterality Date  . Coronary artery bypass graft  1996  . Inguinal hernia repair  1996    right, hx  . Transurethral resection of prostate    . Ptca      percutaneous transluminal coronary intervention and brachy therapy, Bruce R. Brodie, MD. EF 60%  . Laparotomy  04/27/2012    Procedure: EXPLORATORY LAPAROTOMY;  Surgeon: Marquesha Robideau O Wyatt, MD;  Location: [facility] OR;  Service: General;  Laterality: N/A;  . Esophagogastroduodenoscopy  05/08/2012    Procedure: ESOPHAGOGASTRODUODENOSCOPY (EGD);  Surgeon: Jyothi Mann, MD;  Location: [facility] ENDOSCOPY;  Service: Endoscopy;  Laterality: N/A;  Paula put on for Dr. Mann , Dr. Mann will call if she wants another time  . Upper gastrointestinal endoscopy    . Colonoscopy        Current Outpatient Prescriptions  Medication Sig Dispense Refill  . atorvastatin (LIPITOR) 80 MG tablet Take 1 tablet (80 mg total) by mouth daily at 6 PM.  30 tablet  0  . carvedilol (COREG) 3.125 MG tablet Take 3.125 mg by mouth daily.      . docusate sodium (COLACE) 100 MG capsule Take 100 mg by mouth 2 (two) times daily as needed for constipation.       . furosemide (LASIX) 40 MG tablet Take 1 tablet (40 mg total) by mouth 2 (two) times daily.  60 tablet  6  . lisinopril (PRINIVIL,ZESTRIL) 2.5 MG tablet Take 1 tablet (2.5 mg total) by mouth daily.  30 tablet  0  . pantoprazole (PROTONIX) 40 MG tablet Take 1 tablet (40 mg total) by mouth  2 (two) times daily before a meal.  60 tablet  3  . spironolactone (ALDACTONE) 25 MG tablet Take 12.5 mg by mouth daily.       . warfarin (COUMADIN) 2.5 MG tablet Take 2.5-5 mg by mouth daily at 6 PM. 1 tablet (2.5) mg every other day, and 2 tablets (5 mg) on other days.  - today 09/08/12 2.5 mg      . aspirin EC 81 MG tablet Take 81 mg by mouth daily.      . Psyllium (METAMUCIL PO) Take 15 mLs by mouth daily. Dissolve in liquid and drink       No current facility-administered medications for this visit.    Allergies  Allergen Reactions  . Cold Medicine Plus (Chlorphen-Pseudoephed-Apap)     Reaction a long time ago - made him feel goofy    History   Social History  . Marital Status: Widowed    Spouse Name: N/A    Number of Children: 1  . Years of Education: N/A   Occupational History  . [name]    Social History Main Topics  . Smoking status: Never Smoker   . Smokeless tobacco: Never Used     Comment: quit in the 1960's  . Alcohol Use: No  . Drug Use: No  . Sexually Active: Not Currently   Other Topics Concern  . Not on file   Social History Narrative  . No narrative on file    Family History  Problem Relation Age of Onset  . Colon cancer Brother   . Cancer Brother     Liver  . Heart disease Father   . Heart disease Brother   . Other Mother     brain tumor    ROS- All systems are reviewed and negative except as per the HPI above  Physical Exam: Filed Vitals:   09/07/12 1514  BP: 94/58  Pulse: 92  Height: 5' 11" (1.803 m)  Weight: 139 lb 6.4 oz (63.231 kg)    GEN- The patient is well appearing, alert and oriented x 3 today.  Wearing a lifevest today Head- normocephalic, atraumatic Eyes-  Sclera clear, conjunctiva pink Ears- hearing intact Oropharynx- clear Neck- supple,  Lungs- Clear to ausculation bilaterally, normal work of breathing Heart- Regular rate and rhythm, no murmurs, rubs or gallops, PMI not laterally displaced GI- soft, NT,  ND, + BS Extremities- no clubbing, cyanosis, + edema MS- no significant deformity or atrophy Skin- no rash or lesion Psych- euthymic mood, full affect Neuro- strength and sensation are intact  EKG 3/13/ 14 reveals sinus rhythm 60 bpm, PR 184, QRS 86. QTc 442, anterolateral TWI Echo 08/18/12 reveals EF 25% with LVEDD   46, RV function is normal  Assessment and Plan:  

## 2012-09-09 NOTE — Assessment & Plan Note (Signed)
Continue to wear lifevest until ICD is implanted No driving x 6 months post arrest

## 2012-09-12 MED ORDER — CEFAZOLIN SODIUM-DEXTROSE 2-3 GM-% IV SOLR
2.0000 g | INTRAVENOUS | Status: DC
Start: 2012-09-12 — End: 2012-09-13
  Filled 2012-09-12 (×2): qty 50

## 2012-09-12 MED ORDER — SODIUM CHLORIDE 0.9 % IR SOLN
80.0000 mg | Status: DC
  Filled 2012-09-12: qty 2

## 2012-09-13 ENCOUNTER — Telehealth: Payer: Self-pay | Admitting: Internal Medicine

## 2012-09-13 ENCOUNTER — Encounter (HOSPITAL_COMMUNITY)
Admission: RE | Disposition: A | Discharge status: Home / Self Care | Payer: Self-pay | Source: Ambulatory Visit | Attending: Internal Medicine

## 2012-09-13 ENCOUNTER — Encounter (HOSPITAL_COMMUNITY): Payer: Self-pay | Admitting: General Practice

## 2012-09-13 ENCOUNTER — Ambulatory Visit (HOSPITAL_COMMUNITY)
Admission: RE | Admit: 2012-09-13 | Discharge: 2012-09-14 | Disposition: A | Discharge status: Home / Self Care | Payer: Medicare Other | Source: Ambulatory Visit | Attending: Internal Medicine | Admitting: Internal Medicine

## 2012-09-13 DIAGNOSIS — I255 Ischemic cardiomyopathy: Secondary | ICD-10-CM | POA: Diagnosis present

## 2012-09-13 DIAGNOSIS — I213 ST elevation (STEMI) myocardial infarction of unspecified site: Secondary | ICD-10-CM

## 2012-09-13 DIAGNOSIS — D689 Coagulation defect, unspecified: Secondary | ICD-10-CM | POA: Insufficient documentation

## 2012-09-13 DIAGNOSIS — D649 Anemia, unspecified: Secondary | ICD-10-CM

## 2012-09-13 DIAGNOSIS — I2699 Other pulmonary embolism without acute cor pulmonale: Secondary | ICD-10-CM

## 2012-09-13 DIAGNOSIS — I10 Essential (primary) hypertension: Secondary | ICD-10-CM | POA: Insufficient documentation

## 2012-09-13 DIAGNOSIS — I5022 Chronic systolic (congestive) heart failure: Secondary | ICD-10-CM | POA: Insufficient documentation

## 2012-09-13 DIAGNOSIS — Z8674 Personal history of sudden cardiac arrest: Secondary | ICD-10-CM | POA: Insufficient documentation

## 2012-09-13 DIAGNOSIS — I509 Heart failure, unspecified: Secondary | ICD-10-CM | POA: Insufficient documentation

## 2012-09-13 DIAGNOSIS — R739 Hyperglycemia, unspecified: Secondary | ICD-10-CM

## 2012-09-13 DIAGNOSIS — I4901 Ventricular fibrillation: Secondary | ICD-10-CM

## 2012-09-13 DIAGNOSIS — E785 Hyperlipidemia, unspecified: Secondary | ICD-10-CM

## 2012-09-13 DIAGNOSIS — R5381 Other malaise: Secondary | ICD-10-CM

## 2012-09-13 DIAGNOSIS — I469 Cardiac arrest, cause unspecified: Secondary | ICD-10-CM

## 2012-09-13 DIAGNOSIS — G9340 Encephalopathy, unspecified: Secondary | ICD-10-CM

## 2012-09-13 DIAGNOSIS — Z87891 Personal history of nicotine dependence: Secondary | ICD-10-CM

## 2012-09-13 DIAGNOSIS — R0602 Shortness of breath: Secondary | ICD-10-CM

## 2012-09-13 DIAGNOSIS — Z951 Presence of aortocoronary bypass graft: Secondary | ICD-10-CM

## 2012-09-13 DIAGNOSIS — Z95810 Presence of automatic (implantable) cardiac defibrillator: Secondary | ICD-10-CM

## 2012-09-13 DIAGNOSIS — Z86711 Personal history of pulmonary embolism: Secondary | ICD-10-CM | POA: Insufficient documentation

## 2012-09-13 DIAGNOSIS — I495 Sick sinus syndrome: Secondary | ICD-10-CM | POA: Insufficient documentation

## 2012-09-13 DIAGNOSIS — R570 Cardiogenic shock: Secondary | ICD-10-CM

## 2012-09-13 DIAGNOSIS — I5021 Acute systolic (congestive) heart failure: Secondary | ICD-10-CM

## 2012-09-13 DIAGNOSIS — I2589 Other forms of chronic ischemic heart disease: Secondary | ICD-10-CM | POA: Insufficient documentation

## 2012-09-13 DIAGNOSIS — J9601 Acute respiratory failure with hypoxia: Secondary | ICD-10-CM

## 2012-09-13 DIAGNOSIS — Z09 Encounter for follow-up examination after completed treatment for conditions other than malignant neoplasm: Secondary | ICD-10-CM

## 2012-09-13 DIAGNOSIS — I959 Hypotension, unspecified: Secondary | ICD-10-CM

## 2012-09-13 DIAGNOSIS — I2510 Atherosclerotic heart disease of native coronary artery without angina pectoris: Secondary | ICD-10-CM | POA: Diagnosis present

## 2012-09-13 DIAGNOSIS — R630 Anorexia: Secondary | ICD-10-CM

## 2012-09-13 DIAGNOSIS — I252 Old myocardial infarction: Secondary | ICD-10-CM

## 2012-09-13 DIAGNOSIS — R1310 Dysphagia, unspecified: Secondary | ICD-10-CM

## 2012-09-13 DIAGNOSIS — I519 Heart disease, unspecified: Secondary | ICD-10-CM

## 2012-09-13 HISTORY — PX: IMPLANTABLE CARDIOVERTER DEFIBRILLATOR IMPLANT: SHX5473

## 2012-09-13 HISTORY — DX: Ventricular fibrillation: I49.01

## 2012-09-13 HISTORY — DX: Presence of automatic (implantable) cardiac defibrillator: Z95.810

## 2012-09-13 LAB — SURGICAL PCR SCREEN: MRSA, PCR: NEGATIVE

## 2012-09-13 LAB — PROTIME-INR
INR: 2.06 — ABNORMAL HIGH (ref 0.00–1.49)
Prothrombin Time: 22.4 seconds — ABNORMAL HIGH (ref 11.6–15.2)

## 2012-09-13 SURGERY — IMPLANTABLE CARDIOVERTER DEFIBRILLATOR IMPLANT
Anesthesia: Moderate Sedation

## 2012-09-13 MED ORDER — MUPIROCIN 2 % EX OINT: TOPICAL_OINTMENT | Freq: Once | CUTANEOUS | Status: AC

## 2012-09-13 MED ORDER — HYDROCODONE-ACETAMINOPHEN 5-325 MG PO TABS
1.0000 | ORAL_TABLET | ORAL | Status: DC | PRN
  Administered 2012-09-13: 1 via ORAL
  Filled 2012-09-13: qty 2

## 2012-09-13 MED ORDER — SODIUM CHLORIDE 0.9 % IV SOLN: 250.0000 mL | INTRAVENOUS | Status: DC | PRN

## 2012-09-13 MED ORDER — MIDAZOLAM HCL 5 MG/5ML IJ SOLN
INTRAMUSCULAR | Status: AC
  Filled 2012-09-13: qty 5

## 2012-09-13 MED ORDER — ASPIRIN EC 81 MG PO TBEC
81.0000 mg | DELAYED_RELEASE_TABLET | Freq: Every day | ORAL | Status: DC
  Administered 2012-09-13 – 2012-09-14 (×2): 81 mg via ORAL
  Filled 2012-09-13 (×2): qty 1

## 2012-09-13 MED ORDER — ONDANSETRON HCL 4 MG/2ML IJ SOLN: 4.0000 mg | Freq: Four times a day (QID) | INTRAMUSCULAR | Status: DC | PRN

## 2012-09-13 MED ORDER — SODIUM CHLORIDE 0.9 % IV SOLN
INTRAVENOUS | Status: DC
  Administered 2012-09-13: 09:00:00 via INTRAVENOUS

## 2012-09-13 MED ORDER — WARFARIN SODIUM 5 MG PO TABS
5.0000 mg | ORAL_TABLET | Freq: Once | ORAL | Status: AC
Start: 2012-09-13 — End: 2012-09-13
  Administered 2012-09-13: 18:00:00 5 mg via ORAL
  Filled 2012-09-13: qty 1

## 2012-09-13 MED ORDER — ATORVASTATIN CALCIUM 80 MG PO TABS
80.0000 mg | ORAL_TABLET | Freq: Every day | ORAL | Status: DC
Start: 2012-09-13 — End: 2012-09-14
  Administered 2012-09-13: 18:00:00 80 mg via ORAL
  Filled 2012-09-13 (×2): qty 1

## 2012-09-13 MED ORDER — SODIUM CHLORIDE 0.9 % IJ SOLN: 3.0000 mL | Freq: Two times a day (BID) | INTRAMUSCULAR | Status: DC

## 2012-09-13 MED ORDER — ACETAMINOPHEN 325 MG PO TABS
325.0000 mg | ORAL_TABLET | ORAL | Status: DC | PRN
  Administered 2012-09-13 – 2012-09-14 (×3): 650 mg via ORAL
  Filled 2012-09-13 (×3): qty 2

## 2012-09-13 MED ORDER — LIDOCAINE HCL (PF) 1 % IJ SOLN
INTRAMUSCULAR | Status: AC
  Filled 2012-09-13: qty 60

## 2012-09-13 MED ORDER — CARVEDILOL 3.125 MG PO TABS
3.1250 mg | ORAL_TABLET | Freq: Every day | ORAL | Status: DC
  Administered 2012-09-14: 09:00:00 3.125 mg via ORAL
  Filled 2012-09-13 (×2): qty 1

## 2012-09-13 MED ORDER — SPIRONOLACTONE 12.5 MG HALF TABLET
12.5000 mg | ORAL_TABLET | Freq: Every day | ORAL | Status: DC
  Administered 2012-09-14: 12.5 mg via ORAL
  Filled 2012-09-13 (×2): qty 1

## 2012-09-13 MED ORDER — WARFARIN - PHYSICIAN DOSING INPATIENT: Freq: Every day | Status: DC

## 2012-09-13 MED ORDER — CEFAZOLIN SODIUM 1-5 GM-% IV SOLN
1.0000 g | Freq: Four times a day (QID) | INTRAVENOUS | Status: AC
  Administered 2012-09-13 – 2012-09-14 (×3): 1 g via INTRAVENOUS
  Filled 2012-09-13 (×3): qty 50

## 2012-09-13 MED ORDER — FUROSEMIDE 40 MG PO TABS
40.0000 mg | ORAL_TABLET | Freq: Two times a day (BID) | ORAL | Status: DC
  Administered 2012-09-13 – 2012-09-14 (×3): 40 mg via ORAL
  Filled 2012-09-13 (×4): qty 1

## 2012-09-13 MED ORDER — HEPARIN (PORCINE) IN NACL 2-0.9 UNIT/ML-% IJ SOLN
INTRAMUSCULAR | Status: AC
  Filled 2012-09-13: qty 500

## 2012-09-13 MED ORDER — MUPIROCIN 2 % EX OINT
TOPICAL_OINTMENT | CUTANEOUS | Status: AC
  Administered 2012-09-13: 1 via NASAL
  Filled 2012-09-13: qty 22

## 2012-09-13 MED ORDER — FENTANYL CITRATE 0.05 MG/ML IJ SOLN
INTRAMUSCULAR | Status: AC
  Filled 2012-09-13: qty 2

## 2012-09-13 MED ORDER — SODIUM CHLORIDE 0.9 % IJ SOLN: 3.0000 mL | INTRAMUSCULAR | Status: DC | PRN

## 2012-09-13 MED ORDER — CHLORHEXIDINE GLUCONATE 4 % EX LIQD: 60.0000 mL | Freq: Once | CUTANEOUS | Status: DC

## 2012-09-13 MED ORDER — LISINOPRIL 2.5 MG PO TABS
2.5000 mg | ORAL_TABLET | Freq: Every day | ORAL | Status: DC
  Administered 2012-09-13: 2.5 mg via ORAL
  Filled 2012-09-13 (×2): qty 1

## 2012-09-13 MED ORDER — CEFAZOLIN SODIUM 1-5 GM-% IV SOLN
1.0000 g | Freq: Four times a day (QID) | INTRAVENOUS | Status: DC
  Filled 2012-09-13 (×2): qty 50

## 2012-09-13 NOTE — Progress Notes (Signed)
ICD Criteria  Current LVEF (within 6 months):25%  NYHA Functional Classification: Class III  Heart Failure History:  Yes, Duration of heart failure since onset is 3 to 9 months  Non-Ischemic Dilated Cardiomyopathy History:  No.  Atrial Fibrillation/Atrial Flutter:  No.  Ventricular Tachycardia History:  Prior VF arrest  Cardiac Arrest History:  Yes, This was a Ventricular Tachycardia/Ventricular Fibrillation Arrest. This was NOT a bradycardia arrest.  History of Syndromes with Risk of Sudden Death:  No.  Previous ICD:  No.  Electrophysiology Study: No.  Anticoagulation Therapy:  Patient is on anticoagulation therapy, anticoagulation was NOT held prior to procedure.   Beta Blocker Therapy:  Yes.   Ace Inhibitor/ARB Therapy:  Yes.

## 2012-09-13 NOTE — H&P (View-Only) (Signed)
Primary Care Physician: [name], MD Referring Physician:  Dr Domingo Cocking Louis Reid is a 79 y.o. male with a h/o CAD, s/p CABG in 1996, s/p brachytherapy to the LAD in 2003, HTN, HL and VF arrest 12/13 in the setting of anterior STEMI requiring emergent PCI. He was admitted 04/2012 with a SBO and required lysis of adhesions. He was then readmitted 12/3-12/13 with VF arrest in the setting of ant STEMI treated with defibrillation and hypothermia protocol. Cardiac cath demonstrated an atretic L-LAD and pLAD was occluded. EF 20% with anterior apical HK.  He underwent salvage PCI with POBA and thrombectomy of the occluded LAD. He was also dx with a pulmonary embolism. This was felt to be a post op PE with recent surgery for SBO.  He was treated with aspirin and Brilinta and Coumadin. Brilinta was stopped in 1/14 after 30 days of DAPT. He has been treated with an optimal medical regimen.  Further medical management is limited by hypotension at this point.  He is also wearing a lifevest.  Today, he denies symptoms of palpitations, chest pain,  orthopnea, PND,  dizziness, presyncope, syncope, or neurologic sequela.  He has SOB which is much worse with inclines.  He also has frequent edema. The patient is tolerating medications without difficulties and is otherwise without complaint today.   Past Medical History  Diagnosis Date  . CAD (coronary artery disease)     a. s/p remote CABG, b. AL STEMI c/b VF arrest in 05/2012 => LHC 05/10/12: Severe 3v CAD, S-OM1/2 ok, SVG-Dx ok, SVG-RCA occluded, LIMA-LAD atretic, proximal LAD occluded. EF 20% with anterior apical HK=> salvage PCI with POBA and thrombectomy of the occluded LAD;  c/b hypoxic encephalopathy  . Sinoatrial node dysfunction   . HTN (hypertension)   . Depression   . History of kidney stones   . Congenital funnel chest   . Inguinal hernia   . Osteoporosis   . SBO (small bowel obstruction) November 2013    s/p lysis of  adhesions  . Pectus excavatum   . Anemia   . Clotting disorder   . Chronic systolic heart failure     a. Echo 12/13: EF 20-25%, anteroseptal, inferior, apical HK, mildly reduced RV function, trivial pericardial effusion;   b.  Echo 3/14:  Ant, septal, apical AK, EF 25%  . Ischemic cardiomyopathy   . Pulmonary embolism     a. after adhesiolysis for SBO in 04/2012 => coumadin for 6 mos  . Hx of ventricular fibrillation     a. in setting of AL STEMI 12/13 => EF 20-25% => d/c on LifeVest (repeat echo planned in 08/2012)   Past Surgical History  Procedure Laterality Date  . Coronary artery bypass graft  1996  . Inguinal hernia repair  1996    right, hx  . Transurethral resection of prostate    . Ptca      percutaneous transluminal coronary intervention and brachy therapy, Bruce R. Juanda Chance, MD. EF 60%  . Laparotomy  04/27/2012    Procedure: EXPLORATORY LAPAROTOMY;  Surgeon: Cherylynn Ridges, MD;  Location: [facility] OR;  Service: General;  Laterality: N/A;  . Esophagogastroduodenoscopy  05/08/2012    Procedure: ESOPHAGOGASTRODUODENOSCOPY (EGD);  Surgeon: Charna Elizabeth, MD;  Location: [facility] ENDOSCOPY;  Service: Endoscopy;  Laterality: N/A;  Gunnar Fusi put on for Dr. Loreta Ave , Dr. Loreta Ave will call if she wants another time  . Upper gastrointestinal endoscopy    . Colonoscopy  Current Outpatient Prescriptions  Medication Sig Dispense Refill  . atorvastatin (LIPITOR) 80 MG tablet Take 1 tablet (80 mg total) by mouth daily at 6 PM.  30 tablet  0  . carvedilol (COREG) 3.125 MG tablet Take 3.125 mg by mouth daily.      Marland Kitchen docusate sodium (COLACE) 100 MG capsule Take 100 mg by mouth 2 (two) times daily as needed for constipation.       . furosemide (LASIX) 40 MG tablet Take 1 tablet (40 mg total) by mouth 2 (two) times daily.  60 tablet  6  . lisinopril (PRINIVIL,ZESTRIL) 2.5 MG tablet Take 1 tablet (2.5 mg total) by mouth daily.  30 tablet  0  . pantoprazole (PROTONIX) 40 MG tablet Take 1 tablet (40 mg total) by mouth  2 (two) times daily before a meal.  60 tablet  3  . spironolactone (ALDACTONE) 25 MG tablet Take 12.5 mg by mouth daily.       Marland Kitchen warfarin (COUMADIN) 2.5 MG tablet Take 2.5-5 mg by mouth daily at 6 PM. 1 tablet (2.5) mg every other day, and 2 tablets (5 mg) on other days.  - today 09/08/12 2.5 mg      . aspirin EC 81 MG tablet Take 81 mg by mouth daily.      . Psyllium (METAMUCIL PO) Take 15 mLs by mouth daily. Dissolve in liquid and drink       No current facility-administered medications for this visit.    Allergies  Allergen Reactions  . Cold Medicine Plus (Chlorphen-Pseudoephed-Apap)     Reaction a long time ago - made him feel goofy    History   Social History  . Marital Status: Widowed    Spouse Name: N/A    Number of Children: 1  . Years of Education: N/A   Occupational History  . [name]    Social History Main Topics  . Smoking status: Never Smoker   . Smokeless tobacco: Never Used     Comment: quit in the 1960's  . Alcohol Use: No  . Drug Use: No  . Sexually Active: Not Currently   Other Topics Concern  . Not on file   Social History Narrative  . No narrative on file    Family History  Problem Relation Age of Onset  . Colon cancer Brother   . Cancer Brother     Liver  . Heart disease Father   . Heart disease Brother   . Other Mother     brain tumor    ROS- All systems are reviewed and negative except as per the HPI above  Physical Exam: Filed Vitals:   09/07/12 1514  BP: 94/58  Pulse: 92  Height: 5\' 11"  (1.803 m)  Weight: 139 lb 6.4 oz (63.231 kg)    GEN- The patient is well appearing, alert and oriented x 3 today.  Wearing a lifevest today Head- normocephalic, atraumatic Eyes-  Sclera clear, conjunctiva pink Ears- hearing intact Oropharynx- clear Neck- supple,  Lungs- Clear to ausculation bilaterally, normal work of breathing Heart- Regular rate and rhythm, no murmurs, rubs or gallops, PMI not laterally displaced GI- soft, NT,  ND, + BS Extremities- no clubbing, cyanosis, + edema MS- no significant deformity or atrophy Skin- no rash or lesion Psych- euthymic mood, full affect Neuro- strength and sensation are intact  EKG 3/13/ 14 reveals sinus rhythm 60 bpm, PR 184, QRS 86. QTc 442, anterolateral TWI Echo 08/18/12 reveals EF 25% with LVEDD  46, RV function is normal  Assessment and Plan:

## 2012-09-13 NOTE — Op Note (Signed)
SURGEON:  Hillis Range, MD      PREPROCEDURE DIAGNOSES:   1. Ischemic cardiomyopathy.   2. New York Heart Association class III, heart failure chronically.   3. Prior VF arrest  4. Bradycardia     POSTPROCEDURE DIAGNOSES:   1. Ischemic cardiomyopathy.   2. New York Heart Association class III, heart failure chronically.   3. Prior VF arrest  4. Bradycardia     PROCEDURES:    1. ICD implantation.  2. Left upper extremity venography  3. Defibrillation threshold testing     INTRODUCTION: Louis Reid is a 79 y.o. male with an ischemic CM (EF 25%), NYHA Class III CHF, and CAD.  He presented previously with VF arrest in the setting of an acute MI.  He has worn a LifeVest and has been treated with an optimal medical regimen for more than 90 days.  His EF has not recovered.  At this time, he meets MADIT II/ SCD-HeFT criteria for ICD implantation for primary prevention of sudden death.  The patient has a narrow QRS and does not meet criteria for revascularization.  The patient has been treated with an optimal medical regimen but continues to have a depressed ejection fraction and NYHA Class III CHF symptoms.  The patient therefore  presents today for ICD implantation.      DESCRIPTION OF PROCEDURE:  Informed written consent was obtained and the patient was brought to the electrophysiology lab in the fasting state. The patient was adequately sedated with intravenous Versed, and fentanyl as outlined in the nursing report.  The patient's left chest was prepped and draped in the usual sterile fashion by the EP lab staff.  The skin overlying the left deltopectoral region was infiltrated with lidocaine for local analgesia.  A 5-cm incision was made over the left deltopectoral region.  A left subcutaneous defibrillator pocket was fashioned using a combination of sharp and blunt dissection.  Electrocautery was used to assure hemostasis.   Left Upper extremity Venography:  A venogram of the left upper  extremity was performed which revealed a small sized left axillary vein which emptied into a small sized left subclavian vein.    RA/RV Lead Placement: The left axillary vein was cannulated with fluoroscopic visualization. Through the left axillary vein, a [employer] [place] , model 2536UY-40  (serial # [id number] ) right atrial lead and a [employer] [place], model 3474Q-59 (serial number [id number]) right ventricular defibrillator lead were advanced with fluoroscopic visualization into the right atrial appendage and right ventricular apex positions respectively.  Initial atrial lead P-waves measured 2.7 mV with an impedance of 493 ohms and a threshold of 0.7 volts at 0.5 milliseconds.  The right ventricular lead R-wave measured 7 mV with impedance of 473 ohms and a threshold of 0.7 volts at 0.5 milliseconds.   The leads were secured to the pectoralis  fascia using #2 silk suture over the suture sleeves.  The pocket then  irrigated with copious gentamicin solution.  The leads were then  connected to a [address] [employer] [place] [address] model [phone] (serial  Number [id number]) ICD. A left submuscular pocket was then fashioned with a combination of sharp and blunt dissection along the plane between the left pectoralis major and minor muscles.  The defibrillator was placed into this submuscular  pocket.  The pocket was then closed with 2.0 Vicryl suture.  The subcutaneous pocket was then closed in 2 layers with 2.0 Vicryl suture  for the subcutaneous and subcuticular layers.  Steri-Strips and a  sterile dressing were then applied.   DFT Testing: Defibrillation Threshold testing was then performed. Ventricular fibrillation was induced with a T shock.  Adequate sensing of ventricular  fibrillation was observed with minimal dropout with a programmed sensitivity of 1.30mV.  The patient was successfully defibrillated to sinus rhythm with a single 15 joules shock delivered from the device with an impedance of 55  ohms in a duration of 4 seconds.  The patient remained in sinus rhythm thereafter.  There were no early apparent complications.  Programmed Extrastimulus testing:  Programmed extrastimulus testing was performed through the device with a basic cycle length of with S1,S2,S3,S4 extrastimuli down to refractoriness (500/300/240/220 msec) with no sustained VT or VF observed.  The procedure was therefore considered completed.  There were no early apparhent complications.     CONCLUSIONS:   1. Ischemic cardiomyopathy with chronic New York Heart Association class III heart failure.   2. Successful ICD implantation.   3. DFT less than or equal to 15 joules.   4. No inducible sustained VT or VF with PES  5. No early apparent complications.

## 2012-09-13 NOTE — Interval H&P Note (Signed)
History and Physical Interval Note:  09/13/2012 9:22 AM  Louis Reid  has presented today for surgery, with the diagnosis of heart failure  The various methods of treatment have been discussed with the patient and family. After consideration of risks, benefits and other options for treatment, the patient has consented to  Procedure(s): IMPLANTABLE CARDIOVERTER DEFIBRILLATOR IMPLANT (N/A) as a surgical intervention .  The patient's history has been reviewed, patient examined, no change in status, stable for surgery.  I have reviewed the patient's chart and labs.  Questions were answered to the patient's satisfaction.     Hillis Range

## 2012-09-13 NOTE — Telephone Encounter (Signed)
Walk in pt Form " [employer]" paper Dropped Off For Completion This Needs to sent to [facility], Sent interoffice 09/13/12/KM

## 2012-09-14 ENCOUNTER — Ambulatory Visit (HOSPITAL_COMMUNITY): Payer: Medicare Other

## 2012-09-14 DIAGNOSIS — I519 Heart disease, unspecified: Secondary | ICD-10-CM

## 2012-09-14 DIAGNOSIS — I2589 Other forms of chronic ischemic heart disease: Secondary | ICD-10-CM

## 2012-09-14 LAB — BASIC METABOLIC PANEL
BUN: 28 mg/dL — ABNORMAL HIGH (ref 6–23)
CO2: 23 mEq/L (ref 19–32)
Calcium: 8.8 mg/dL (ref 8.4–10.5)
Chloride: 103 mEq/L (ref 96–112)
Creatinine, Ser: 1.17 mg/dL (ref 0.50–1.35)
Glucose, Bld: 117 mg/dL — ABNORMAL HIGH (ref 70–99)

## 2012-09-14 MED ORDER — YOU HAVE A PACEMAKER BOOK
Freq: Once | Status: AC
  Administered 2012-09-14: 04:00:00
  Filled 2012-09-14: qty 1

## 2012-09-14 NOTE — Discharge Summary (Signed)
ELECTROPHYSIOLOGY DISCHARGE SUMMARY    Patient ID: Louis Reid,  MRN: [id number], DOB/AGE: [birth year] 79 y.o.  Admit date: 09/13/2012 Discharge date: 09/14/2012  Primary Care Physician: Windle Guard, MD Primary Cardiologist: Daleen Squibb, MD  Primary Discharge Diagnosis:  1. ICM s/p ICD implantation  2. Prior VF arrest in setting of acute MI  Secondary Discharge Diagnoses:  1. Chronic systolic HF 2. CAD 3. Sinus node dysfunction 4. HTN 5. Clotting disorder 6. History of anemia 7. History of PE  Procedures This Admission:  1. Dual chamber ICD implantation  RA lead - [employer] [place] , model 4098JX-91 (serial # [id number] ) right atrial lead  RV lead - [employer] [place], model 4782N-56 (serial number [id number]) right ventricular defibrillator lead  Device - [employer] [place] DR model [phone] (serial Number [id number]) ICD  History and Hospital Course:  Louis Reid is a 79 year old man with an ischemic CM (EF 25%), NYHA Class III CHF, and CAD. He presented previously with VF arrest in the setting of an acute MI. He has worn a LifeVest and has been treated with an optimal medical regimen for more than 90 days. His EF has not recovered. At this time, he meets MADIT II/ SCD-HeFT criteria for ICD implantation for primary prevention of sudden death. Louis Reid has a narrow QRS and does not meet criteria for revascularization. He has been treated with an optimal medical regimen but continues to have a depressed ejection fraction and NYHA Class III CHF symptoms; therefore presented yesterday for ICD implantation. Louis Reid tolerated this procedure well without any immediate complication. He remains hemodynamically stable and afebrile. His chest xray shows stable lead placement without pneumothorax. His device interrogation shows normal ICD function with stable lead parameters/measurements. His implant site is intact without significant bleeding or hematoma. He has been given  discharge instructions including wound care and activity restrictions. He will follow-up in 10 days for wound check. There were no changes made to his medications. He has been seen, examined and deemed stable for discharge today by Dr. Hillis Range   Physical Exam: Vitals: Blood pressure 103/65, pulse 63, temperature 97.9 F (36.6 C), temperature source Oral, resp. rate 18, height 5\' 11"  (1.803 m), weight 137 lb 9.1 oz (62.4 kg), SpO2 98.00%.  General: WD, well appearing 79 year old male in no acute distress. Heart: RRR. S1, S2 without murmur, rub or S3.  Lungs: CTA bilaterally. No wheezes, rales or rhonchi. Extremities: No cyanosis, clubbing or edema.  Skin: Left upper chest/implant site intact without bleeding or hematoma.  Labs: Lab Results  Component Value Date   WBC 6.7 09/07/2012   HGB 9.6* 09/07/2012   HCT 29.4* 09/07/2012   MCV 93.6 09/07/2012   PLT 182.0 09/07/2012     Recent Labs Lab 09/14/12 0630  NA 137  K 4.6  CL 103  CO2 23  BUN 28*  CREATININE 1.17  CALCIUM 8.8  GLUCOSE 117*    Recent Labs  09/14/12 0630  INR 2.51*    Disposition:  The patient is being discharged in stable condition.  Follow-up:     Follow-up Information   Follow up with [facility] On 09/22/2012. (At 10:00 AM for wound check)    Contact information:   [address] [place] [address] [phone]      Follow up with Hillis Range, MD In 3 months. (Our office will schedule)    Contact information:   [address] [place] [address] [phone]     Discharge Medications:    Medication List    TAKE these medications       aspirin EC 81 MG tablet  Take 81 mg by mouth daily.     atorvastatin 80 MG tablet  Commonly known as:  LIPITOR  Take 1 tablet (80 mg total) by mouth daily at 6 PM.     carvedilol 3.125 MG tablet  Commonly known as:  COREG  Take 3.125 mg by mouth daily.     docusate sodium 100 MG capsule  Commonly known  as:  COLACE  Take 100 mg by mouth 2 (two) times daily as needed for constipation.     furosemide 40 MG tablet  Commonly known as:  LASIX  Take 1 tablet (40 mg total) by mouth 2 (two) times daily.     lisinopril 2.5 MG tablet  Commonly known as:  PRINIVIL,ZESTRIL  Take 1 tablet (2.5 mg total) by mouth daily.     METAMUCIL PO  Take 15 mLs by mouth daily. Dissolve in liquid and drink     pantoprazole 40 MG tablet  Commonly known as:  PROTONIX  Take 1 tablet (40 mg total) by mouth 2 (two) times daily before a meal.     spironolactone 25 MG tablet  Commonly known as:  ALDACTONE  Take 12.5 mg by mouth daily.     warfarin 2.5 MG tablet  Commonly known as:  COUMADIN  Take 2.5-5 mg by mouth daily at 6 PM. 1 tablet (2.5) mg every other day, and 2 tablets (5 mg) on other days.  - today 09/08/12 2.5 mg       Duration of Discharge Encounter: Greater than 30 minutes including physician time.  Limmie Patricia, [name] 09/14/2012, 9:27 AM   Hillis Range

## 2012-09-14 NOTE — Progress Notes (Signed)
Doing well s/p ICD CXR reveals stable leads, no ptx ICD interrogation is reviewed and normal (See paper chart)  Routine wound care and follow-up Discharge to home.

## 2012-09-15 ENCOUNTER — Telehealth: Payer: Self-pay | Admitting: Internal Medicine

## 2012-09-15 NOTE — Telephone Encounter (Signed)
**  TCM** spoke with patient who states he is doing well today.  Patient states he is taking Tylenol for mild arm discomfort and it is working well.  Patient states he is taking his medications as directed and denies questions or concerns at this time.  Patient is aware of his appointment on 4/17 @ 1000.

## 2012-09-22 ENCOUNTER — Ambulatory Visit (INDEPENDENT_AMBULATORY_CARE_PROVIDER_SITE_OTHER): Payer: Medicare Other | Admitting: *Deleted

## 2012-09-22 ENCOUNTER — Other Ambulatory Visit: Payer: Self-pay | Admitting: Internal Medicine

## 2012-09-22 DIAGNOSIS — I5021 Acute systolic (congestive) heart failure: Secondary | ICD-10-CM

## 2012-09-22 DIAGNOSIS — I255 Ischemic cardiomyopathy: Secondary | ICD-10-CM

## 2012-09-22 DIAGNOSIS — I2589 Other forms of chronic ischemic heart disease: Secondary | ICD-10-CM

## 2012-09-22 DIAGNOSIS — I509 Heart failure, unspecified: Secondary | ICD-10-CM

## 2012-09-22 LAB — ICD DEVICE OBSERVATION
AL AMPLITUDE: 5 mv
AL IMPEDENCE ICD: 425 Ohm
ATRIAL PACING ICD: 7.1 pct
BAMS-0001: 150 {beats}/min
BAMS-0003: 70 {beats}/min
DEV-0020ICD: NEGATIVE
DEVICE MODEL ICD: 7094248
MODE SWITCH EPISODES: 2
RV LEAD AMPLITUDE: 8.9 mv
RV LEAD THRESHOLD: 0.75 V
TOT-0007: 1
TOT-0008: 0
TZAT-0001SLOWVT: 1
TZAT-0004SLOWVT: 8
TZAT-0012SLOWVT: 200 ms
TZAT-0019SLOWVT: 7.5 V
TZON-0003SLOWVT: 330 ms
TZON-0004SLOWVT: 16
TZON-0010SLOWVT: 40 ms
TZST-0001SLOWVT: 3
TZST-0003SLOWVT: 30 J
VENTRICULAR PACING ICD: 0.01 pct

## 2012-09-22 NOTE — Progress Notes (Signed)
Wound check-ICD 

## 2012-10-08 ENCOUNTER — Other Ambulatory Visit (INDEPENDENT_AMBULATORY_CARE_PROVIDER_SITE_OTHER): Payer: Self-pay | Admitting: General Surgery

## 2012-10-10 NOTE — Telephone Encounter (Signed)
This is at patient that you last seen in Jan 2014.he would like a refill..please advise

## 2012-10-17 ENCOUNTER — Telehealth: Payer: Self-pay | Admitting: Cardiology

## 2012-10-17 NOTE — Telephone Encounter (Signed)
New Prob     Pt is requesting permission to drive again. Would like to speak to nurse.

## 2012-10-17 NOTE — Telephone Encounter (Signed)
Attempted to return patient's call.  No answer/no VM

## 2012-10-17 NOTE — Telephone Encounter (Signed)
Spoke with patient who is requesting driving privileges.  Patient instructed that he may return to driving 6 months from date of the cardiac arrest.  Patient states his arrest occurred on 05/10/12.  Patient instructed that he may drive beginning 06/13/08. Patient instructed to cease driving if he feels s/s of irregular heart rate, chest pain, dizziness, defibrillator shocks or any other cardiac symptoms.  Patient verbalized understanding.

## 2012-10-17 NOTE — Telephone Encounter (Signed)
Follow up  ° ° ° °Returning call back to nurse  °

## 2012-10-21 ENCOUNTER — Encounter: Payer: Self-pay | Admitting: Internal Medicine

## 2012-10-21 ENCOUNTER — Ambulatory Visit (INDEPENDENT_AMBULATORY_CARE_PROVIDER_SITE_OTHER): Payer: Medicare Other | Admitting: *Deleted

## 2012-10-21 DIAGNOSIS — I428 Other cardiomyopathies: Secondary | ICD-10-CM

## 2012-10-21 LAB — ICD DEVICE OBSERVATION
ATRIAL PACING ICD: 7.6 pct
BAMS-0001: 150 {beats}/min
BAMS-0003: 70 {beats}/min
DEV-0020ICD: NEGATIVE
DEVICE MODEL ICD: 7094248
HV IMPEDENCE: 56 Ohm
MODE SWITCH EPISODES: 3
PACEART VT: 0
TOT-0006: 20140408000000
TOT-0007: 1
TZAT-0004SLOWVT: 8
TZAT-0012SLOWVT: 200 ms
TZAT-0013SLOWVT: 3
TZAT-0018SLOWVT: NEGATIVE
TZAT-0019SLOWVT: 7.5 V
TZAT-0020SLOWVT: 1 ms
TZON-0003SLOWVT: 330 ms
TZON-0004SLOWVT: 16
TZON-0005SLOWVT: 6
TZST-0001SLOWVT: 3
TZST-0003SLOWVT: 30 J
TZST-0003SLOWVT: 36 J

## 2012-10-21 NOTE — Progress Notes (Signed)
Device interrogation with morphology update

## 2012-11-15 ENCOUNTER — Telehealth: Payer: Self-pay | Admitting: Internal Medicine

## 2012-11-15 NOTE — Telephone Encounter (Signed)
New problem    Pt has a question about operation he had on 09/13/12

## 2012-11-15 NOTE — Telephone Encounter (Signed)
Wants to know if his procedure could be changed to inpatient.  Due to being outpatient it is going to cost him about $3,000 out of his pocket

## 2012-12-01 ENCOUNTER — Encounter: Payer: Self-pay | Admitting: Internal Medicine

## 2012-12-14 ENCOUNTER — Encounter: Payer: Self-pay | Admitting: *Deleted

## 2012-12-14 ENCOUNTER — Ambulatory Visit (INDEPENDENT_AMBULATORY_CARE_PROVIDER_SITE_OTHER): Payer: Medicare Other | Admitting: Internal Medicine

## 2012-12-14 ENCOUNTER — Encounter: Payer: Self-pay | Admitting: Internal Medicine

## 2012-12-14 VITALS — BP 122/74 | HR 73 | Ht 71.0 in | Wt 133.0 lb

## 2012-12-14 DIAGNOSIS — I469 Cardiac arrest, cause unspecified: Secondary | ICD-10-CM

## 2012-12-14 DIAGNOSIS — I509 Heart failure, unspecified: Secondary | ICD-10-CM

## 2012-12-14 DIAGNOSIS — I2510 Atherosclerotic heart disease of native coronary artery without angina pectoris: Secondary | ICD-10-CM

## 2012-12-14 DIAGNOSIS — I5021 Acute systolic (congestive) heart failure: Secondary | ICD-10-CM

## 2012-12-14 DIAGNOSIS — I4901 Ventricular fibrillation: Secondary | ICD-10-CM

## 2012-12-14 DIAGNOSIS — I2589 Other forms of chronic ischemic heart disease: Secondary | ICD-10-CM

## 2012-12-14 DIAGNOSIS — I255 Ischemic cardiomyopathy: Secondary | ICD-10-CM

## 2012-12-14 LAB — ICD DEVICE OBSERVATION
AL AMPLITUDE: 5 mv
ATRIAL PACING ICD: 15 pct
BAMS-0001: 150 {beats}/min
CHARGE TIME: 7.6 s
DEV-0020ICD: NEGATIVE
FVT: 0
MODE SWITCH EPISODES: 11
RV LEAD AMPLITUDE: 10.4 mv
RV LEAD THRESHOLD: 0.75 V
TOT-0007: 1
TZAT-0001SLOWVT: 1
TZAT-0004SLOWVT: 8
TZON-0010SLOWVT: 40 ms
TZST-0001SLOWVT: 3
TZST-0003SLOWVT: 30 J
VENTRICULAR PACING ICD: 0.04 pct
VF: 0

## 2012-12-14 NOTE — Patient Instructions (Addendum)
Your physician wants you to follow-up in:   9 MONTHS WITH DR Johney Frame    AND CHECK FROM  HOME  DEVICE IN October  AND NEXT AVAILABLE WITH  DR Jens Som   You will receive a reminder letter in the mail two months in advance. If you don't receive a letter, please call our office to schedule the follow-up appointment. Your physician recommends that you continue on your current medications as directed. Please refer to the Current Medication list given to you today.

## 2012-12-14 NOTE — Progress Notes (Signed)
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  Since having his ICD implanted, the patient reports doing very well.  Today, he denies symptoms of palpitations, chest pain, shortness of breath,  lower extremity edema, dizziness, presyncope, syncope, or ICD shocks.  The patient is otherwise without complaint today.   Past Medical History  Diagnosis Date  . CAD (coronary artery disease)     a. s/p remote CABG, b. AL STEMI c/b VF arrest in 05/2012 => LHC 05/10/12: Severe 3v CAD, S-OM1/2 ok, SVG-Dx ok, SVG-RCA occluded, LIMA-LAD atretic, proximal LAD occluded. EF 20% with anterior apical HK=> salvage PCI with POBA and thrombectomy of the occluded LAD;  c/b hypoxic encephalopathy  . HTN (hypertension)   . Depression   . Congenital funnel chest   . Osteoporosis   . SBO (small bowel obstruction) November 2013    s/p lysis of adhesions  . Pectus excavatum   . Anemia   . Clotting disorder   . Chronic systolic heart failure     a. Echo 12/13: EF 20-25%, anteroseptal, inferior, apical HK, mildly reduced RV function, trivial pericardial effusion;   b.  Echo 3/14:  Ant, septal, apical AK, EF 25%  . Ischemic cardiomyopathy   . Pulmonary embolism     a. after adhesiolysis for SBO in 04/2012 => coumadin for 6 mos  . Kidney stones   . ICD (implantable cardiac defibrillator) in place   . STEMI (ST elevation myocardial infarction) 05/2012  . Sinoatrial node dysfunction   . Cardiac arrest 05/2012  . Ventricular fibrillation 05/2012    . in setting of AL STEMI 12/13 => EF 20-25% => d/c on LifeVest (repeat echo planned in 08/2012)  . Pneumonia 05/2012  . Inguinal hernia     "have one now on my left side" (09/13/2012)   Past Surgical History  Procedure Laterality Date  . Transurethral resection of prostate    . Ptca      percutaneous transluminal coronary intervention and brachy therapy, Bruce R.  Juanda Chance, MD. EF 60%  . Laparotomy  04/27/2012    Procedure: EXPLORATORY LAPAROTOMY;  Surgeon: Cherylynn Ridges, MD;  Location: [facility];  Service: General;  Laterality: N/A;  . Esophagogastroduodenoscopy  05/08/2012    Procedure: ESOPHAGOGASTRODUODENOSCOPY (EGD);  Surgeon: Charna Elizabeth, MD;  Location: [facility] ENDOSCOPY;  Service: Endoscopy;  Laterality: N/A;  Gunnar Fusi put on for Dr. Loreta Ave , Dr. Loreta Ave will call if she wants another time  . Upper gastrointestinal endoscopy    . Colonoscopy    . Cardiac catheterization    . Cardiac defibrillator placement  09/13/2012    SJM Ellipse ER implanted by Dr Johney Frame for secondary prevention  . Coronary artery bypass graft  1996    "CABG X5" (09/13/2012)  . Inguinal hernia repair Right 1996  . Cystoscopy/retrograde/ureteroscopy/stone extraction with basket  1990's    Current Outpatient Prescriptions  Medication Sig Dispense Refill  . aspirin EC 81 MG tablet Take 81 mg by mouth daily.      . carvedilol (COREG) 3.125 MG tablet Take 3.125 mg by mouth 2 (two) times daily with a meal.       . docusate sodium (COLACE) 100 MG capsule Take 100 mg by mouth 2 (two) times daily as needed for constipation.       . furosemide (LASIX) 40 MG tablet Take 40 mg by mouth daily.      . Psyllium (  METAMUCIL PO) Take 15 mLs by mouth daily. Dissolve in liquid and drink      . rivaroxaban (XARELTO) 10 MG TABS tablet Take 20 mg by mouth daily.      Marland Kitchen spironolactone (ALDACTONE) 25 MG tablet Take 12.5 mg by mouth daily.        No current facility-administered medications for this visit.    Physical Exam: Filed Vitals:   12/14/12 0936  BP: 122/74  Pulse: 73  Height: 5\' 11"  (1.803 m)  Weight: 133 lb (60.328 kg)    GEN- The patient is well appearing, alert and oriented x 3 today.   Head- normocephalic, atraumatic Eyes-  Sclera clear, conjunctiva pink Ears- hearing intact Oropharynx- clear Lungs- Clear to ausculation bilaterally, normal work of breathing Chest- ICD pocket is well  healed Heart- Regular rate and rhythm, no murmurs, rubs or gallops, PMI not laterally displaced GI- soft, NT, ND, + BS Extremities- no clubbing, cyanosis, or edema  ICD interrogation- reviewed in detail today,  See PACEART report  Assessment and Plan:  1.  Prior VF arrest Normal ICD function  2. Chronic systolic dysfunction euvolemic today Stable on an appropriate medical regimen Normal ICD function See Pace Art report No changes today  3. CAD No ischemic symptoms No changes today  Follow-up with general cardiology I will see in the device clinic in 9 months

## 2013-02-16 ENCOUNTER — Encounter: Payer: Self-pay | Admitting: Cardiology

## 2013-02-16 ENCOUNTER — Ambulatory Visit (INDEPENDENT_AMBULATORY_CARE_PROVIDER_SITE_OTHER): Payer: Medicare Other | Admitting: Cardiology

## 2013-02-16 VITALS — BP 120/80 | HR 67 | Wt 133.0 lb

## 2013-02-16 DIAGNOSIS — E785 Hyperlipidemia, unspecified: Secondary | ICD-10-CM

## 2013-02-16 DIAGNOSIS — I10 Essential (primary) hypertension: Secondary | ICD-10-CM

## 2013-02-16 DIAGNOSIS — I2699 Other pulmonary embolism without acute cor pulmonale: Secondary | ICD-10-CM

## 2013-02-16 DIAGNOSIS — Z95810 Presence of automatic (implantable) cardiac defibrillator: Secondary | ICD-10-CM

## 2013-02-16 DIAGNOSIS — I255 Ischemic cardiomyopathy: Secondary | ICD-10-CM

## 2013-02-16 DIAGNOSIS — I2510 Atherosclerotic heart disease of native coronary artery without angina pectoris: Secondary | ICD-10-CM

## 2013-02-16 DIAGNOSIS — I2589 Other forms of chronic ischemic heart disease: Secondary | ICD-10-CM

## 2013-02-16 MED ORDER — ATORVASTATIN CALCIUM 40 MG PO TABS: 40.0000 mg | ORAL_TABLET | Freq: Every day | ORAL | Status: DC

## 2013-02-16 MED ORDER — LISINOPRIL 2.5 MG PO TABS: 2.5000 mg | ORAL_TABLET | Freq: Every day | ORAL | Status: DC

## 2013-02-16 NOTE — Patient Instructions (Addendum)
Your physician has recommended you make the following change in your medication: stop taking Xarelto and start taking Lisinopril 2.5 mg daily and Lipitor 40 mg at bedtime  Your physician recommends that you return for lab work in: 1 week on September 18 and in 6 weeks on October 27 (please do not eat or drink after midnight the night before labs are draw. You may drink enough water to take your morning medications that morning.)  Your physician wants you to follow-up in: 6 months. You will receive a reminder letter in the mail two months in advance. If you don't receive a letter, please call our office to schedule the follow-up appointment.

## 2013-02-16 NOTE — Progress Notes (Signed)
HPI: 79 year old male previously followed by Dr. Daleen Squibb for followup of coronary artery disease and ischemic cardiomyopathy. Patient is status post coronary artery bypass graft in 1996. Patient had an anterior infarct in December of 2013 complicated by ventricular fibrillation arrest. Cardiac catheterization revealed sequential saphenous vein graft to the OM1 and OM2 patent, saphenous vein graft to diagonal patent, saphenous vein graft to the right coronary occluded and LIMA to the LAD atretic. The proximal LAD was occluded. Ejection fraction 20%. The patient had PCI of his LAD. Course complicated by hypoxic encephalopathy. Echocardiogram repeat in March of 2013. Ejection fraction 25%. He is now status post ICD 4/14. Since he was last seen, the patient has dyspnea with more extreme activities but not with routine activities. It is relieved with rest. It is not associated with chest pain. There is no orthopnea, PND or pedal edema. There is no syncope or palpitations. There is no exertional chest pain.   Current Outpatient Prescriptions  Medication Sig Dispense Refill  . aspirin EC 81 MG tablet Take 81 mg by mouth daily.      . carvedilol (COREG) 3.125 MG tablet Take 3.125 mg by mouth 2 (two) times daily with a meal.       . docusate sodium (COLACE) 100 MG capsule Take 100 mg by mouth 2 (two) times daily as needed for constipation.       . furosemide (LASIX) 40 MG tablet Take 40 mg by mouth daily.      . Psyllium (METAMUCIL PO) Take 15 mLs by mouth daily. Dissolve in liquid and drink      . Rivaroxaban (XARELTO) 20 MG TABS tablet Take 20 mg by mouth daily.      Marland Kitchen spironolactone (ALDACTONE) 25 MG tablet Take 12.5 mg by mouth daily.        No current facility-administered medications for this visit.     Past Medical History  Diagnosis Date  . CAD (coronary artery disease)     a. s/p remote CABG, b. AL STEMI c/b VF arrest in 05/2012 => LHC 05/10/12: Severe 3v CAD, S-OM1/2 ok, SVG-Dx ok, SVG-RCA  occluded, LIMA-LAD atretic, proximal LAD occluded. EF 20% with anterior apical HK=> salvage PCI with POBA and thrombectomy of the occluded LAD;  c/b hypoxic encephalopathy  . HTN (hypertension)   . Depression   . Congenital funnel chest   . Osteoporosis   . SBO (small bowel obstruction) November 2013    s/p lysis of adhesions  . Pectus excavatum   . Anemia   . Clotting disorder   . Chronic systolic heart failure     a. Echo 12/13: EF 20-25%, anteroseptal, inferior, apical HK, mildly reduced RV function, trivial pericardial effusion;   b.  Echo 3/14:  Ant, septal, apical AK, EF 25%  . Ischemic cardiomyopathy   . Pulmonary embolism     a. after adhesiolysis for SBO in 04/2012 => coumadin for 6 mos  . Kidney stones   . ICD (implantable cardiac defibrillator) in place   . STEMI (ST elevation myocardial infarction) 05/2012  . Sinoatrial node dysfunction   . Cardiac arrest 05/2012  . Ventricular fibrillation 05/2012    . in setting of AL STEMI 12/13 => EF 20-25% => d/c on LifeVest (repeat echo planned in 08/2012)  . Pneumonia 05/2012  . Inguinal hernia     "have one now on my left side" (09/13/2012)    Past Surgical History  Procedure Laterality Date  . Transurethral resection of prostate    .  Ptca      percutaneous transluminal coronary intervention and brachy therapy, Bruce R. Juanda Chance, MD. EF 60%  . Laparotomy  04/27/2012    Procedure: EXPLORATORY LAPAROTOMY;  Surgeon: Cherylynn Ridges, MD;  Location: [facility] OR;  Service: General;  Laterality: N/A;  . Esophagogastroduodenoscopy  05/08/2012    Procedure: ESOPHAGOGASTRODUODENOSCOPY (EGD);  Surgeon: Charna Elizabeth, MD;  Location: [facility] ENDOSCOPY;  Service: Endoscopy;  Laterality: N/A;  Gunnar Fusi put on for Dr. Loreta Ave , Dr. Loreta Ave will call if she wants another time  . Upper gastrointestinal endoscopy    . Colonoscopy    . Cardiac catheterization    . Cardiac defibrillator placement  09/13/2012    SJM Ellipse ER implanted by Dr Johney Frame for secondary prevention    . Coronary artery bypass graft  1996    "CABG X5" (09/13/2012)  . Inguinal hernia repair Right 1996  . Cystoscopy/retrograde/ureteroscopy/stone extraction with basket  1990's    History   Social History  . Marital Status: Widowed    Spouse Name: N/A    Number of Children: 1  . Years of Education: N/A   Occupational History  . [name]    Social History Main Topics  . Smoking status: Former Smoker    Types: Cigarettes  . Smokeless tobacco: Never Used     Comment: 09/14/2011 "quit smoking when I was ~ 17; probably didn't smoke 10 packs in my life"  . Alcohol Use: No  . Drug Use: No  . Sexual Activity: Not Currently   Other Topics Concern  . Not on file   Social History Narrative  . No narrative on file    ROS: no fevers or chills, productive cough, hemoptysis, dysphasia, odynophagia, melena, hematochezia, dysuria, hematuria, rash, seizure activity, orthopnea, PND, pedal edema, claudication. Remaining systems are negative.  Physical Exam: Well-developed well-nourished in no acute distress.  Skin is warm and dry.  HEENT is normal.  Neck is supple.  Chest is clear to auscultation with normal expansion. Patient has pectus excavatum Cardiovascular exam is regular rate and rhythm.  Abdominal exam nontender or distended. No masses palpated. Extremities show no edema. neuro grossly intact  ECG sinus rhythm at a rate of 67. Prior inferior infarct. Anterior and inferior T-wave inversion.

## 2013-02-16 NOTE — Assessment & Plan Note (Signed)
Followed by electrophysiology. 

## 2013-02-16 NOTE — Assessment & Plan Note (Signed)
Continue aspirin. Add statin. 

## 2013-02-16 NOTE — Assessment & Plan Note (Signed)
Blood pressure controlled. 

## 2013-02-16 NOTE — Assessment & Plan Note (Signed)
Previous pulmonary embolus felt related to prior surgery. It has now been 9 months. Discontinue xeralto.

## 2013-02-16 NOTE — Assessment & Plan Note (Signed)
Continue low-dose Coreg. Add lisinopril 2.5 mg daily. Check potassium and renal function in one week.

## 2013-02-16 NOTE — Assessment & Plan Note (Signed)
Resume Lipitor 40 mg daily. Check lipids, liver and CK in 6 weeks.

## 2013-02-22 ENCOUNTER — Telehealth: Payer: Self-pay | Admitting: Cardiology

## 2013-02-22 NOTE — Telephone Encounter (Signed)
Phone busy x 2 @ 11:30

## 2013-02-22 NOTE — Telephone Encounter (Signed)
Spoke w/pt .  Advised to check w/Debra when he comes in for lab to see if Dr. Jens Som will sign for handicap sticker. Will route to Dr. Jens Som and Carmelina Noun

## 2013-02-22 NOTE — Telephone Encounter (Signed)
Ok for handicap sticker Olga Millers

## 2013-02-22 NOTE — Telephone Encounter (Signed)
Unable to reach pt or leave a message  

## 2013-02-22 NOTE — Telephone Encounter (Signed)
Patient is coming in for lab work in the morning and want to know if Dr. Jens Som can sign a handy-cap application for him to take to [facility].

## 2013-02-23 ENCOUNTER — Other Ambulatory Visit (INDEPENDENT_AMBULATORY_CARE_PROVIDER_SITE_OTHER): Payer: Medicare Other

## 2013-02-23 DIAGNOSIS — I10 Essential (primary) hypertension: Secondary | ICD-10-CM

## 2013-02-23 LAB — BASIC METABOLIC PANEL
BUN: 28 mg/dL — ABNORMAL HIGH (ref 6–23)
CO2: 28 mEq/L (ref 19–32)
Calcium: 8.5 mg/dL (ref 8.4–10.5)
GFR: 58.28 mL/min — ABNORMAL LOW (ref >60.00)
Glucose, Bld: 95 mg/dL (ref 70–99)
Sodium: 139 mEq/L (ref 135–145)

## 2013-02-23 NOTE — Telephone Encounter (Signed)
Pt here and given handicap plaque

## 2013-03-20 ENCOUNTER — Ambulatory Visit (INDEPENDENT_AMBULATORY_CARE_PROVIDER_SITE_OTHER): Payer: Medicare Other | Admitting: *Deleted

## 2013-03-20 DIAGNOSIS — I509 Heart failure, unspecified: Secondary | ICD-10-CM

## 2013-03-20 DIAGNOSIS — I5021 Acute systolic (congestive) heart failure: Secondary | ICD-10-CM

## 2013-03-20 DIAGNOSIS — I2589 Other forms of chronic ischemic heart disease: Secondary | ICD-10-CM

## 2013-03-20 DIAGNOSIS — Z95810 Presence of automatic (implantable) cardiac defibrillator: Secondary | ICD-10-CM

## 2013-03-20 DIAGNOSIS — I255 Ischemic cardiomyopathy: Secondary | ICD-10-CM

## 2013-03-22 LAB — REMOTE ICD DEVICE
AL IMPEDENCE ICD: 450 Ohm
BAMS-0001: 150 {beats}/min
HV IMPEDENCE: 61 Ohm
RV LEAD AMPLITUDE: 11.7 mv
TZAT-0004SLOWVT: 8
TZAT-0013SLOWVT: 3
TZAT-0018SLOWVT: NEGATIVE
TZAT-0019SLOWVT: 7.5 V
TZON-0003SLOWVT: 330 ms
TZON-0004SLOWVT: 30
TZST-0001SLOWVT: 3
TZST-0003SLOWVT: 36 J
VENTRICULAR PACING ICD: 19 pct

## 2013-03-29 ENCOUNTER — Encounter: Payer: Self-pay | Admitting: Internal Medicine

## 2013-04-03 ENCOUNTER — Ambulatory Visit (INDEPENDENT_AMBULATORY_CARE_PROVIDER_SITE_OTHER): Payer: Medicare Other | Admitting: Cardiology

## 2013-04-03 ENCOUNTER — Encounter: Payer: Self-pay | Admitting: *Deleted

## 2013-04-03 DIAGNOSIS — I2589 Other forms of chronic ischemic heart disease: Secondary | ICD-10-CM

## 2013-04-03 DIAGNOSIS — E785 Hyperlipidemia, unspecified: Secondary | ICD-10-CM

## 2013-04-03 DIAGNOSIS — I255 Ischemic cardiomyopathy: Secondary | ICD-10-CM

## 2013-04-03 LAB — HEPATIC FUNCTION PANEL: Albumin: 3.4 g/dL — ABNORMAL LOW (ref 3.5–5.2)

## 2013-04-03 LAB — LIPID PANEL
HDL: 39 mg/dL — ABNORMAL LOW (ref >39.00)
LDL Cholesterol: 52 mg/dL (ref 0–99)
Total CHOL/HDL Ratio: 3
Triglycerides: 71 mg/dL (ref 0.0–149.0)
VLDL: 14.2 mg/dL (ref 0.0–40.0)

## 2013-04-03 LAB — CARDIAC PANEL
CK-MB: 3.6 ng/mL (ref 0.3–4.0)
Total CK: 81 U/L (ref 7–232)

## 2013-04-08 ENCOUNTER — Encounter (HOSPITAL_COMMUNITY): Payer: Self-pay | Admitting: Emergency Medicine

## 2013-04-08 ENCOUNTER — Emergency Department (HOSPITAL_COMMUNITY)
Admission: EM | Admit: 2013-04-08 | Discharge: 2013-04-09 | Disposition: A | Discharge status: Home / Self Care | Payer: Medicare Other | Attending: Emergency Medicine | Admitting: Emergency Medicine

## 2013-04-08 DIAGNOSIS — Z86711 Personal history of pulmonary embolism: Secondary | ICD-10-CM | POA: Insufficient documentation

## 2013-04-08 DIAGNOSIS — Z954 Presence of other heart-valve replacement: Secondary | ICD-10-CM | POA: Insufficient documentation

## 2013-04-08 DIAGNOSIS — Z87442 Personal history of urinary calculi: Secondary | ICD-10-CM | POA: Insufficient documentation

## 2013-04-08 DIAGNOSIS — H10219 Acute toxic conjunctivitis, unspecified eye: Secondary | ICD-10-CM | POA: Insufficient documentation

## 2013-04-08 DIAGNOSIS — I5022 Chronic systolic (congestive) heart failure: Secondary | ICD-10-CM | POA: Insufficient documentation

## 2013-04-08 DIAGNOSIS — Y929 Unspecified place or not applicable: Secondary | ICD-10-CM | POA: Insufficient documentation

## 2013-04-08 DIAGNOSIS — Z8659 Personal history of other mental and behavioral disorders: Secondary | ICD-10-CM | POA: Insufficient documentation

## 2013-04-08 DIAGNOSIS — M810 Age-related osteoporosis without current pathological fracture: Secondary | ICD-10-CM | POA: Insufficient documentation

## 2013-04-08 DIAGNOSIS — T5291XA Toxic effect of unspecified organic solvent, accidental (unintentional), initial encounter: Secondary | ICD-10-CM | POA: Insufficient documentation

## 2013-04-08 DIAGNOSIS — Z95810 Presence of automatic (implantable) cardiac defibrillator: Secondary | ICD-10-CM | POA: Insufficient documentation

## 2013-04-08 DIAGNOSIS — Z87891 Personal history of nicotine dependence: Secondary | ICD-10-CM | POA: Insufficient documentation

## 2013-04-08 DIAGNOSIS — I252 Old myocardial infarction: Secondary | ICD-10-CM | POA: Insufficient documentation

## 2013-04-08 DIAGNOSIS — Z7982 Long term (current) use of aspirin: Secondary | ICD-10-CM | POA: Insufficient documentation

## 2013-04-08 DIAGNOSIS — I10 Essential (primary) hypertension: Secondary | ICD-10-CM | POA: Insufficient documentation

## 2013-04-08 DIAGNOSIS — Z862 Personal history of diseases of the blood and blood-forming organs and certain disorders involving the immune mechanism: Secondary | ICD-10-CM | POA: Insufficient documentation

## 2013-04-08 DIAGNOSIS — H269 Unspecified cataract: Secondary | ICD-10-CM | POA: Insufficient documentation

## 2013-04-08 DIAGNOSIS — Z8719 Personal history of other diseases of the digestive system: Secondary | ICD-10-CM | POA: Insufficient documentation

## 2013-04-08 DIAGNOSIS — Y9389 Activity, other specified: Secondary | ICD-10-CM | POA: Insufficient documentation

## 2013-04-08 DIAGNOSIS — Z79899 Other long term (current) drug therapy: Secondary | ICD-10-CM | POA: Insufficient documentation

## 2013-04-08 DIAGNOSIS — H10211 Acute toxic conjunctivitis, right eye: Secondary | ICD-10-CM

## 2013-04-08 DIAGNOSIS — Z8701 Personal history of pneumonia (recurrent): Secondary | ICD-10-CM | POA: Insufficient documentation

## 2013-04-08 DIAGNOSIS — Z951 Presence of aortocoronary bypass graft: Secondary | ICD-10-CM | POA: Insufficient documentation

## 2013-04-08 DIAGNOSIS — I2510 Atherosclerotic heart disease of native coronary artery without angina pectoris: Secondary | ICD-10-CM | POA: Insufficient documentation

## 2013-04-08 DIAGNOSIS — Z9861 Coronary angioplasty status: Secondary | ICD-10-CM | POA: Insufficient documentation

## 2013-04-08 MED ORDER — FLUORESCEIN SODIUM 1 MG OP STRP: 1.0000 | ORAL_STRIP | Freq: Once | OPHTHALMIC | Status: DC

## 2013-04-08 MED ORDER — TETRACAINE HCL 0.5 % OP SOLN: 2.0000 [drp] | Freq: Once | OPHTHALMIC | Status: DC

## 2013-04-08 NOTE — ED Notes (Signed)
NS  Infused via Nona Dell completed.  Patient tolerated it well

## 2013-04-08 NOTE — ED Provider Notes (Signed)
CSN: [id number]     Arrival date & time 04/08/13  2019 History  This chart was scribed for non-physician practitioner Trixie Dredge, PA, working with Ethelda Chick, MD by Ronal Fear, ED scribe. This patient was seen in room TR04C/TR04C and the patient's care was started at 9:03 PM.  Chief Complaint  Patient presents with  . Foreign Body in Eye   Patient is a 79 y.o. male presenting with foreign body in eye. The history is provided by the patient. No language interpreter was used.  Foreign Body in Eye Pertinent negatives include no headaches.  Foreign Body in Eye Pertinent negatives include no headaches or vomiting.  HPI Comments: Louis Reid is a 79 y.o. male who presents to the Emergency Department complaining of sudden onset burning eye pain after getting cleaning product in his eye while washing out his toilet bowl.  Pt states that he washed the eye out immediately after the incident for about 6 minutes. The eye is currently watering and he says that the burning sensation has resolved some since onset.  Denies blurry vision.  The cleaner splashed only into his right eye, denies any entry into left eye.  Pt brought in the cleaner:  Contains hydrogen chloride, alkyl, dimethyl benzyl ammonium chloride.   PCP: Dr. Windle Guard Past Medical History  Diagnosis Date  . CAD (coronary artery disease)     a. s/p remote CABG, b. AL STEMI c/b VF arrest in 05/2012 => LHC 05/10/12: Severe 3v CAD, S-OM1/2 ok, SVG-Dx ok, SVG-RCA occluded, LIMA-LAD atretic, proximal LAD occluded. EF 20% with anterior apical HK=> salvage PCI with POBA and thrombectomy of the occluded LAD;  c/b hypoxic encephalopathy  . HTN (hypertension)   . Depression   . Congenital funnel chest   . Osteoporosis   . SBO (small bowel obstruction) November 2013    s/p lysis of adhesions  . Pectus excavatum   . Anemia   . Clotting disorder   . Chronic systolic heart failure     a. Echo 12/13: EF 20-25%, anteroseptal, inferior, apical  HK, mildly reduced RV function, trivial pericardial effusion;   b.  Echo 3/14:  Ant, septal, apical AK, EF 25%  . Ischemic cardiomyopathy   . Pulmonary embolism     a. after adhesiolysis for SBO in 04/2012 => coumadin for 6 mos  . Kidney stones   . ICD (implantable cardiac defibrillator) in place   . STEMI (ST elevation myocardial infarction) 05/2012  . Sinoatrial node dysfunction   . Cardiac arrest 05/2012  . Ventricular fibrillation 05/2012    . in setting of AL STEMI 12/13 => EF 20-25% => d/c on LifeVest (repeat echo planned in 08/2012)  . Pneumonia 05/2012  . Inguinal hernia     "have one now on my left side" (09/13/2012)  . Cataract    Past Surgical History  Procedure Laterality Date  . Transurethral resection of prostate    . Ptca      percutaneous transluminal coronary intervention and brachy therapy, Bruce R. Juanda Chance, MD. EF 60%  . Laparotomy  04/27/2012    Procedure: EXPLORATORY LAPAROTOMY;  Surgeon: Cherylynn Ridges, MD;  Location: [facility] OR;  Service: General;  Laterality: N/A;  . Esophagogastroduodenoscopy  05/08/2012    Procedure: ESOPHAGOGASTRODUODENOSCOPY (EGD);  Surgeon: Charna Elizabeth, MD;  Location: [facility] ENDOSCOPY;  Service: Endoscopy;  Laterality: N/A;  Gunnar Fusi put on for Dr. Loreta Ave , Dr. Loreta Ave will call if she wants another time  . Upper gastrointestinal endoscopy    .  Colonoscopy    . Cardiac catheterization    . Cardiac defibrillator placement  09/13/2012    SJM Ellipse ER implanted by Dr Johney Frame for secondary prevention  . Coronary artery bypass graft  1996    "CABG X5" (09/13/2012)  . Inguinal hernia repair Right 1996  . Cystoscopy/retrograde/ureteroscopy/stone extraction with basket  1990's   Family History  Problem Relation Age of Onset  . Colon cancer Brother   . Cancer Brother     Liver  . Heart disease Father   . Heart disease Brother   . Other Mother     brain tumor   History  Substance Use Topics  . Smoking status: Former Smoker    Types: Cigarettes  . Smokeless  tobacco: Never Used     Comment: 09/14/2011 "quit smoking when I was ~ 17; probably didn't smoke 10 packs in my life"  . Alcohol Use: No    Review of Systems  Eyes: Positive for pain, discharge and redness. Negative for photophobia, itching and visual disturbance.  Gastrointestinal: Negative for vomiting.  Skin: Negative for color change and wound.  Neurological: Negative for light-headedness and headaches.    Allergies  Cold medicine plus  Home Medications   Current Outpatient Rx  Name  Route  Sig  Dispense  Refill  . aspirin EC 81 MG tablet   Oral   Take 81 mg by mouth daily.         Marland Kitchen atorvastatin (LIPITOR) 40 MG tablet   Oral   Take 1 tablet (40 mg total) by mouth daily.   30 tablet   5   . carvedilol (COREG) 3.125 MG tablet   Oral   Take 3.125 mg by mouth 2 (two) times daily with a meal.          . docusate sodium (COLACE) 100 MG capsule   Oral   Take 100 mg by mouth 2 (two) times daily as needed for constipation.          . furosemide (LASIX) 40 MG tablet   Oral   Take 40 mg by mouth daily.         Marland Kitchen lisinopril (PRINIVIL,ZESTRIL) 2.5 MG tablet   Oral   Take 1 tablet (2.5 mg total) by mouth daily.   30 tablet   5   . Psyllium (METAMUCIL PO)   Oral   Take 15 mLs by mouth daily. Dissolve in liquid and drink         . spironolactone (ALDACTONE) 25 MG tablet   Oral   Take 12.5 mg by mouth daily.           BP 135/75  Pulse 76  Temp(Src) 98.4 F (36.9 C) (Oral)  Resp 20  Wt 135 lb 9 oz (61.491 kg)  BMI 18.92 kg/m2  SpO2 92% Physical Exam  Nursing note and vitals reviewed. Constitutional: He appears well-developed and well-nourished. No distress.  HENT:  Head: Normocephalic and atraumatic.  Redness to right eye  Eyes: EOM are normal. Pupils are equal, round, and reactive to light. Right eye exhibits discharge. Right conjunctiva is injected. Left conjunctiva is not injected. No scleral icterus. Right eye exhibits normal extraocular motion.  Left eye exhibits normal extraocular motion.  Ph right eye 9 Left eye 8  Neck: Neck supple.  Pulmonary/Chest: Effort normal.  Neurological: He is alert.  Skin: He is not diaphoretic.    ED Course  Procedures (including critical care time) DIAGNOSTIC STUDIES: Oxygen Saturation is 92% on RA, low  by my interpretation.    COORDINATION OF CARE:    9:10 PM- Pt advised of plan for treatment including flushing the eye, PH test, and dye to test fpr scratches to the eye. Pt advised to follow up with his eye doctor and pt agrees.   Labs Review Labs Reviewed - No data to display Imaging Review No results found.  EKG Interpretation   None      11:10 PM rechecked pH, which has improved.  Now between 8 and 9, improved from 9.  Left eye is asymptomatic, has not been splashed, is 8.    12:12 AM After second liter of IVF, patient's right eye is no pH of 8, matching the unaffected eye.  While this is not the standard range pH for the eye, this appears to be the normal pH for this patient. He reports feeling much better and having no discomfort and no blurry vision in the eye.    MDM   1. Chemical conjunctivitis of right eye    Pt presents with chemical conjunctivitis of right eye after accidentally getting a few drops of toilet bowl cleaner in his eye.  Right eye pH was initially 1 higher than unaffected left eye.  Vision was unchanged.  Eye irrigated with 2L NS with morgan lens.  PH reduced to 8, matching the left eye.  Pt d/c home with erythromycin ointment.  Advised close follow up with his ophthalmologist.  Pt does have ophthalmologist and will follow up Monday morning.  Nurse discussed with poison control who recommended our treatment. Discussed findings, treatment, and follow up  with patient.  Pt given return precautions.  Pt verbalizes understanding and agrees with plan.      I personally performed the services described in this documentation, which was scribed in my presence. The  recorded information has been reviewed and is accurate.    [place], [name] 04/09/13 [phone]

## 2013-04-08 NOTE — ED Notes (Signed)
Called Poison Control ([phone]) Spoke with Revonda Standard.  Stated we were doing the proper thing by irrigating the eye and to use 500-1023ml of NS to flush his right eye

## 2013-04-08 NOTE — ED Notes (Signed)
Pt states he was using a liquid cleaner and some splashed in right eye. He rinsed for ten minutes. He says his vision has remained normal for his base line.

## 2013-04-08 NOTE — ED Notes (Signed)
Patient arrived from home.  Stating he was cleaning and used some Lysol Bowl Cleaner and some splashed up in his right eye.

## 2013-04-08 NOTE — ED Notes (Signed)
NS infused via morgan lens completed. Pt tolerated it well.

## 2013-04-09 MED ORDER — ERYTHROMYCIN 5 MG/GM OP OINT: TOPICAL_OINTMENT | Freq: Four times a day (QID) | OPHTHALMIC | Status: DC

## 2013-04-09 NOTE — ED Provider Notes (Signed)
Medical screening examination/treatment/procedure(s) were performed by non-physician practitioner and as supervising physician I was immediately available for consultation/collaboration.  EKG Interpretation   None        Ethelda Chick, MD 04/09/13 1517

## 2013-04-12 ENCOUNTER — Ambulatory Visit (INDEPENDENT_AMBULATORY_CARE_PROVIDER_SITE_OTHER): Payer: Medicare Other | Admitting: Cardiology

## 2013-04-12 ENCOUNTER — Encounter: Payer: Self-pay | Admitting: Cardiology

## 2013-04-12 VITALS — BP 108/62 | HR 66 | Ht 71.0 in | Wt 134.8 lb

## 2013-04-12 DIAGNOSIS — I255 Ischemic cardiomyopathy: Secondary | ICD-10-CM

## 2013-04-12 DIAGNOSIS — I2589 Other forms of chronic ischemic heart disease: Secondary | ICD-10-CM

## 2013-04-12 DIAGNOSIS — I10 Essential (primary) hypertension: Secondary | ICD-10-CM

## 2013-04-12 MED ORDER — SPIRONOLACTONE 25 MG PO TABS: 25.0000 mg | ORAL_TABLET | Freq: Every day | ORAL | Status: DC

## 2013-04-12 MED ORDER — CARVEDILOL 6.25 MG PO TABS: 6.2500 mg | ORAL_TABLET | Freq: Two times a day (BID) | ORAL | Status: DC

## 2013-04-12 NOTE — Assessment & Plan Note (Signed)
Followed by electrophysiology. 

## 2013-04-12 NOTE — Progress Notes (Signed)
HPI: followup of coronary artery disease and ischemic cardiomyopathy. Patient is status post coronary artery bypass graft in 1996. Patient had an anterior infarct in December of 2013 complicated by ventricular fibrillation arrest. Cardiac catheterization revealed sequential saphenous vein graft to the OM1 and OM2 patent, saphenous vein graft to diagonal patent, saphenous vein graft to the right coronary occluded and LIMA to the LAD atretic. The proximal LAD was occluded. Ejection fraction 20%. The patient had PCI of his LAD. Course complicated by hypoxic encephalopathy. Echocardiogram repeated in March of 2013. Ejection fraction 25%. He is now status post ICD 4/14. At last office visit in September we added an ACE inhibitor and discontinued his xeralto for previous PE. Patient recently had his device interrogated and his Corview was noted to be elevated. Patient has chronic dyspnea on exertion unchanged. No orthopnea or PND. Occasional mild pedal edema. No chest pain or syncope.   Current Outpatient Prescriptions  Medication Sig Dispense Refill  . alendronate (FOSAMAX) 70 MG tablet Take 70 mg by mouth every 7 (seven) days. Take with a full glass of water on an empty stomach.      Marland Kitchen aspirin EC 81 MG tablet Take 81 mg by mouth daily.      Marland Kitchen atorvastatin (LIPITOR) 40 MG tablet Take 1 tablet (40 mg total) by mouth daily.  30 tablet  5  . carvedilol (COREG) 3.125 MG tablet Take 3.125 mg by mouth 2 (two) times daily with a meal.       . docusate sodium (COLACE) 100 MG capsule Take 100 mg by mouth 2 (two) times daily as needed for constipation.       . furosemide (LASIX) 40 MG tablet Take 40 mg by mouth daily.      Marland Kitchen lisinopril (PRINIVIL,ZESTRIL) 2.5 MG tablet Take 1 tablet (2.5 mg total) by mouth daily.  30 tablet  5  . Psyllium (METAMUCIL PO) Take 15 mLs by mouth daily. Dissolve in liquid and drink      . spironolactone (ALDACTONE) 25 MG tablet Take 12.5 mg by mouth daily.        No current  facility-administered medications for this visit.     Past Medical History  Diagnosis Date  . CAD (coronary artery disease)     a. s/p remote CABG, b. AL STEMI c/b VF arrest in 05/2012 => LHC 05/10/12: Severe 3v CAD, S-OM1/2 ok, SVG-Dx ok, SVG-RCA occluded, LIMA-LAD atretic, proximal LAD occluded. EF 20% with anterior apical HK=> salvage PCI with POBA and thrombectomy of the occluded LAD;  c/b hypoxic encephalopathy  . HTN (hypertension)   . Depression   . Congenital funnel chest   . Osteoporosis   . SBO (small bowel obstruction) November 2013    s/p lysis of adhesions  . Pectus excavatum   . Anemia   . Clotting disorder   . Chronic systolic heart failure     a. Echo 12/13: EF 20-25%, anteroseptal, inferior, apical HK, mildly reduced RV function, trivial pericardial effusion;   b.  Echo 3/14:  Ant, septal, apical AK, EF 25%  . Ischemic cardiomyopathy   . Pulmonary embolism     a. after adhesiolysis for SBO in 04/2012 => coumadin for 6 mos  . Kidney stones   . ICD (implantable cardiac defibrillator) in place   . STEMI (ST elevation myocardial infarction) 05/2012  . Sinoatrial node dysfunction   . Cardiac arrest 05/2012  . Ventricular fibrillation 05/2012    . in setting of AL  STEMI 12/13 => EF 20-25% => d/c on LifeVest (repeat echo planned in 08/2012)  . Pneumonia 05/2012  . Inguinal hernia     "have one now on my left side" (09/13/2012)  . Cataract     Past Surgical History  Procedure Laterality Date  . Transurethral resection of prostate    . Ptca      percutaneous transluminal coronary intervention and brachy therapy, Bruce R. Juanda Chance, MD. EF 60%  . Laparotomy  04/27/2012    Procedure: EXPLORATORY LAPAROTOMY;  Surgeon: Cherylynn Ridges, MD;  Location: [facility] OR;  Service: General;  Laterality: N/A;  . Esophagogastroduodenoscopy  05/08/2012    Procedure: ESOPHAGOGASTRODUODENOSCOPY (EGD);  Surgeon: Charna Elizabeth, MD;  Location: [facility] ENDOSCOPY;  Service: Endoscopy;  Laterality: N/A;  Gunnar Fusi  put on for Dr. Loreta Ave , Dr. Loreta Ave will call if she wants another time  . Upper gastrointestinal endoscopy    . Colonoscopy    . Cardiac catheterization    . Cardiac defibrillator placement  09/13/2012    SJM Ellipse ER implanted by Dr Johney Frame for secondary prevention  . Coronary artery bypass graft  1996    "CABG X5" (09/13/2012)  . Inguinal hernia repair Right 1996  . Cystoscopy/retrograde/ureteroscopy/stone extraction with basket  1990's    History   Social History  . Marital Status: Widowed    Spouse Name: N/A    Number of Children: 1  . Years of Education: N/A   Occupational History  . [name]    Social History Main Topics  . Smoking status: Former Smoker    Types: Cigarettes  . Smokeless tobacco: Never Used     Comment: 09/14/2011 "quit smoking when I was ~ 17; probably didn't smoke 10 packs in my life"  . Alcohol Use: No  . Drug Use: No  . Sexual Activity: Not Currently   Other Topics Concern  . Not on file   Social History Narrative  . No narrative on file    ROS: no fevers or chills, productive cough, hemoptysis, dysphasia, odynophagia, melena, hematochezia, dysuria, hematuria, rash, seizure activity, orthopnea, PND, pedal edema, claudication. Remaining systems are negative.  Physical Exam: Well-developed well-nourished in no acute distress.  Skin is warm and dry.  HEENT is normal.  Neck is supple.  Chest is clear to auscultation with normal expansion. Pectus excavatum Cardiovascular exam is regular rate and rhythm.  Abdominal exam nontender or distended. No masses palpated. Extremities show trace edema. neuro grossly intact

## 2013-04-12 NOTE — Assessment & Plan Note (Signed)
Continue ACE inhibitor. Increase carvedilol to 6.25 twice a day.

## 2013-04-12 NOTE — Assessment & Plan Note (Signed)
Patient's corview noted to be elevated recently. His symptoms are relatively unchanged. I have instructed him on low sodium diet. He will weigh himself daily and take an additional 40 mg of Lasix if he gains 2 pounds. Increase spironolactone to 25 mg daily. Check potassium and renal function in one week.

## 2013-04-12 NOTE — Assessment & Plan Note (Signed)
Continue aspirin and statin. 

## 2013-04-12 NOTE — Patient Instructions (Signed)
Your physician recommends that you schedule a follow-up appointment in: 3 MONTHS WITH DR CRENSHAW  INCREASE SPIRONOLACTONE TO 25 MG ONCE DAILY  INCREASE CARVEDILOL TO 6.25 MG TWICE DAILY  TAKE AN EXTRA 40 MG OF FUROSEMIDE AS NEEDED FOR WEIGHT GAIN OF 2 LBS  Your physician recommends that you return for lab work in: ONE WEEK

## 2013-04-12 NOTE — Assessment & Plan Note (Signed)
Continue statin. 

## 2013-04-12 NOTE — Assessment & Plan Note (Signed)
Blood pressure controlled. Continue present medications. 

## 2013-04-17 ENCOUNTER — Encounter: Payer: Self-pay | Admitting: Internal Medicine

## 2013-04-19 ENCOUNTER — Other Ambulatory Visit (INDEPENDENT_AMBULATORY_CARE_PROVIDER_SITE_OTHER): Payer: Medicare Other

## 2013-04-19 DIAGNOSIS — I255 Ischemic cardiomyopathy: Secondary | ICD-10-CM

## 2013-04-19 DIAGNOSIS — I2589 Other forms of chronic ischemic heart disease: Secondary | ICD-10-CM

## 2013-04-19 LAB — BASIC METABOLIC PANEL
BUN: 27 mg/dL — ABNORMAL HIGH (ref 6–23)
CO2: 29 mEq/L (ref 19–32)
Calcium: 8.7 mg/dL (ref 8.4–10.5)
Creatinine, Ser: 1.3 mg/dL (ref 0.4–1.5)
Glucose, Bld: 94 mg/dL (ref 70–99)

## 2013-06-20 ENCOUNTER — Encounter: Payer: Medicare Other | Admitting: *Deleted

## 2013-06-20 ENCOUNTER — Ambulatory Visit (INDEPENDENT_AMBULATORY_CARE_PROVIDER_SITE_OTHER): Payer: Medicare Other | Admitting: *Deleted

## 2013-06-20 ENCOUNTER — Encounter: Payer: Self-pay | Admitting: Internal Medicine

## 2013-06-20 DIAGNOSIS — I469 Cardiac arrest, cause unspecified: Secondary | ICD-10-CM

## 2013-06-23 ENCOUNTER — Encounter: Payer: Medicare Other | Admitting: *Deleted

## 2013-06-26 ENCOUNTER — Encounter: Payer: Self-pay | Admitting: *Deleted

## 2013-07-03 ENCOUNTER — Telehealth: Payer: Self-pay | Admitting: Internal Medicine

## 2013-07-03 NOTE — Telephone Encounter (Signed)
Instructions given on the procedure for sending remote transmission.

## 2013-07-03 NOTE — Telephone Encounter (Signed)
New message     Having problems transmitting device remotely.  Want to talk with some one in the device clinic

## 2013-07-10 ENCOUNTER — Ambulatory Visit (INDEPENDENT_AMBULATORY_CARE_PROVIDER_SITE_OTHER): Payer: Medicare Other | Admitting: Cardiology

## 2013-07-10 ENCOUNTER — Encounter: Payer: Self-pay | Admitting: Cardiology

## 2013-07-10 VITALS — BP 110/50 | HR 65 | Ht 71.0 in | Wt 136.1 lb

## 2013-07-10 DIAGNOSIS — E785 Hyperlipidemia, unspecified: Secondary | ICD-10-CM

## 2013-07-10 DIAGNOSIS — Z95810 Presence of automatic (implantable) cardiac defibrillator: Secondary | ICD-10-CM

## 2013-07-10 DIAGNOSIS — I2510 Atherosclerotic heart disease of native coronary artery without angina pectoris: Secondary | ICD-10-CM

## 2013-07-10 DIAGNOSIS — I255 Ischemic cardiomyopathy: Secondary | ICD-10-CM

## 2013-07-10 DIAGNOSIS — I2589 Other forms of chronic ischemic heart disease: Secondary | ICD-10-CM

## 2013-07-10 DIAGNOSIS — I509 Heart failure, unspecified: Secondary | ICD-10-CM

## 2013-07-10 DIAGNOSIS — I5021 Acute systolic (congestive) heart failure: Secondary | ICD-10-CM

## 2013-07-10 DIAGNOSIS — I10 Essential (primary) hypertension: Secondary | ICD-10-CM

## 2013-07-10 LAB — BASIC METABOLIC PANEL
BUN: 26 mg/dL — ABNORMAL HIGH (ref 6–23)
CALCIUM: 8.8 mg/dL (ref 8.4–10.5)
CHLORIDE: 105 meq/L (ref 96–112)
CO2: 28 mEq/L (ref 19–32)
Creatinine, Ser: 1.3 mg/dL (ref 0.4–1.5)
GFR: 57.19 mL/min — ABNORMAL LOW (ref >60.00)
Glucose, Bld: 87 mg/dL (ref 70–99)
Potassium: 4.5 mEq/L (ref 3.5–5.1)
SODIUM: 139 meq/L (ref 135–145)

## 2013-07-10 LAB — BRAIN NATRIURETIC PEPTIDE: Pro B Natriuretic peptide (BNP): 240 pg/mL — ABNORMAL HIGH (ref 0.0–100.0)

## 2013-07-10 NOTE — Assessment & Plan Note (Signed)
Followed by electrophysiology. 

## 2013-07-10 NOTE — Patient Instructions (Addendum)
Your physician wants you to follow-up in: [place] will receive a reminder letter in the mail two months in advance. If you don't receive a letter, please call our office to schedule the follow-up appointment.   Your physician recommends that you HAVE LAB WORK TODAY  TAKE AN EXTRA 20 MG OF FUROSEMIDE AS NEEDED FOR WEIGHT GAIN OF 2 POUNDS OR SWELLING OR SHORTNESS OF BREATH

## 2013-07-10 NOTE — Assessment & Plan Note (Signed)
Continue ACE inhibitor and beta blocker. Patient has gained 2-3 pounds recently. I have asked him to take an additional 20 mg of Lasix daily as needed for weight gain of 2 pounds, increasing edema or dyspnea. Check potassium and BNP.

## 2013-07-10 NOTE — Assessment & Plan Note (Signed)
Blood pressure controlled. Continue present medications. 

## 2013-07-10 NOTE — Assessment & Plan Note (Signed)
Continue statin. 

## 2013-07-10 NOTE — Assessment & Plan Note (Signed)
Continue aspirin and statin. 

## 2013-07-10 NOTE — Progress Notes (Signed)
HPI: followup of coronary artery disease and ischemic cardiomyopathy. Patient is status post coronary artery bypass graft in 1996. Patient had an anterior infarct in December of 7654 complicated by ventricular fibrillation arrest. Cardiac catheterization revealed sequential saphenous vein graft to the OM1 and OM2 patent, saphenous vein graft to diagonal patent, saphenous vein graft to the right coronary occluded and LIMA to the LAD atretic. The proximal LAD was occluded. Ejection fraction 20%. The patient had PCI of his LAD. Course complicated by hypoxic encephalopathy. Echocardiogram repeated in March of 2014. Ejection fraction 25%. He is status post ICD 4/14. The patient last seen in November of 2014. Since then, the patient denies any dyspnea on exertion, orthopnea, PND, pedal edema, palpitations, syncope or chest pain.    Current Outpatient Prescriptions  Medication Sig Dispense Refill  . alendronate (FOSAMAX) 70 MG tablet Take 70 mg by mouth every 7 (seven) days. Take with a full glass of water on an empty stomach.      Marland Kitchen aspirin EC 81 MG tablet Take 81 mg by mouth daily.      Marland Kitchen atorvastatin (LIPITOR) 40 MG tablet Take 1 tablet (40 mg total) by mouth daily.  30 tablet  5  . carvedilol (COREG) 6.25 MG tablet Take 1 tablet (6.25 mg total) by mouth 2 (two) times daily with a meal.  60 tablet  12  . docusate sodium (COLACE) 100 MG capsule Take 100 mg by mouth 2 (two) times daily as needed for constipation.       . furosemide (LASIX) 40 MG tablet Take 40 mg by mouth daily.      Marland Kitchen lisinopril (PRINIVIL,ZESTRIL) 2.5 MG tablet Take 1 tablet (2.5 mg total) by mouth daily.  30 tablet  5  . Psyllium (METAMUCIL PO) Take 15 mLs by mouth daily. Dissolve in liquid and drink      . spironolactone (ALDACTONE) 25 MG tablet Take 1 tablet (25 mg total) by mouth daily.  30 tablet  12   No current facility-administered medications for this visit.     Past Medical History  Diagnosis Date  . CAD  (coronary artery disease)     a. s/p remote CABG, b. AL STEMI c/b VF arrest in 05/2012 => LHC 05/10/12: Severe 3v CAD, S-OM1/2 ok, SVG-Dx ok, SVG-RCA occluded, LIMA-LAD atretic, proximal LAD occluded. EF 20% with anterior apical HK=> salvage PCI with POBA and thrombectomy of the occluded LAD;  c/b hypoxic encephalopathy  . HTN (hypertension)   . Depression   . Congenital funnel chest   . Osteoporosis   . SBO (small bowel obstruction) November 2013    s/p lysis of adhesions  . Pectus excavatum   . Anemia   . Clotting disorder   . Chronic systolic heart failure     a. Echo 12/13: EF 20-25%, anteroseptal, inferior, apical HK, mildly reduced RV function, trivial pericardial effusion;   b.  Echo 3/14:  Ant, septal, apical AK, EF 25%  . Ischemic cardiomyopathy   . Pulmonary embolism     a. after adhesiolysis for SBO in 04/2012 => coumadin for 6 mos  . Kidney stones   . ICD (implantable cardiac defibrillator) in place   . STEMI (ST elevation myocardial infarction) 05/2012  . Sinoatrial node dysfunction   . Cardiac arrest 05/2012  . Ventricular fibrillation 05/2012    . in setting of AL STEMI 12/13 => EF 20-25% => d/c on LifeVest (repeat echo planned in 08/2012)  . Pneumonia 05/2012  .  Inguinal hernia     "have one now on my left side" (09/13/2012)  . Cataract     Past Surgical History  Procedure Laterality Date  . Transurethral resection of prostate    . Ptca      percutaneous transluminal coronary intervention and brachy therapy, Bruce R. Olevia Perches, MD. EF 60%  . Laparotomy  04/27/2012    Procedure: EXPLORATORY LAPAROTOMY;  Surgeon: Gwenyth Ober, MD;  Location: [place];  Service: General;  Laterality: N/A;  . Esophagogastroduodenoscopy  05/08/2012    Procedure: ESOPHAGOGASTRODUODENOSCOPY (EGD);  Surgeon: Juanita Craver, MD;  Location: [facility] ENDOSCOPY;  Service: Endoscopy;  Laterality: N/A;  Nevin Bloodgood put on for Dr. Collene Mares , Dr. Collene Mares will call if she wants another time  . Upper gastrointestinal endoscopy     . Colonoscopy    . Cardiac catheterization    . Cardiac defibrillator placement  09/13/2012    SJM Ellipse ER implanted by Dr Rayann Heman for secondary prevention  . Coronary artery bypass graft  1996    "CABG X5" (09/13/2012)  . Inguinal hernia repair Right 1996  . Cystoscopy/retrograde/ureteroscopy/stone extraction with basket  1990's    History   Social History  . Marital Status: Widowed    Spouse Name: N/A    Number of Children: 1  . Years of Education: N/A   Occupational History  . [name]    Social History Main Topics  . Smoking status: Former Smoker    Types: Cigarettes  . Smokeless tobacco: Never Used     Comment: 09/14/2011 "quit smoking when I was ~ 17; probably didn't smoke 10 packs in my life"  . Alcohol Use: No  . Drug Use: No  . Sexual Activity: Not Currently   Other Topics Concern  . Not on file   Social History Narrative  . No narrative on file    ROS: no fevers or chills, productive cough, hemoptysis, dysphasia, odynophagia, melena, hematochezia, dysuria, hematuria, rash, seizure activity, orthopnea, PND, pedal edema, claudication. Remaining systems are negative.  Physical Exam: Well-developed well-nourished in no acute distress.  Skin is warm and dry.  HEENT is normal.  Neck is supple.  Chest is clear to auscultation with normal expansion. Pectus excavatum Cardiovascular exam is regular rate and rhythm.  Abdominal exam nontender or distended. No masses palpated. Extremities show trace edema. neuro grossly intact  ECG sinus rhythm at a rate of 65. Intermittent atrial pacing. RV conduction delay. Prior septal infarct. Anterior and inferior T-wave inversion.

## 2013-07-12 ENCOUNTER — Encounter: Payer: Self-pay | Admitting: *Deleted

## 2013-07-26 LAB — MDC_IDC_ENUM_SESS_TYPE_REMOTE
Brady Statistic RA Percent Paced: 18 %
Brady Statistic RV Percent Paced: 1 %
Implantable Pulse Generator Serial Number: 7094248
Lead Channel Impedance Value: 400 Ohm
Lead Channel Impedance Value: 460 Ohm
Lead Channel Sensing Intrinsic Amplitude: 11.7 mV
Lead Channel Setting Pacing Amplitude: 2 V
Lead Channel Setting Pacing Amplitude: 2.5 V
Lead Channel Setting Pacing Pulse Width: 0.5 ms
MDC IDC MSMT LEADCHNL RA SENSING INTR AMPL: 3.7 mV
MDC IDC PG MODEL: 2411
MDC IDC SET LEADCHNL RV SENSING SENSITIVITY: 0.5 mV
MDC IDC SET ZONE DETECTION INTERVAL: 330 ms
Zone Setting Detection Interval: 270 ms

## 2013-08-07 ENCOUNTER — Encounter: Payer: Self-pay | Admitting: *Deleted

## 2013-08-15 ENCOUNTER — Other Ambulatory Visit: Payer: Self-pay

## 2013-08-15 MED ORDER — ATORVASTATIN CALCIUM 40 MG PO TABS: 40.0000 mg | ORAL_TABLET | Freq: Every day | ORAL | Status: DC

## 2013-08-15 MED ORDER — LISINOPRIL 2.5 MG PO TABS: 2.5000 mg | ORAL_TABLET | Freq: Every day | ORAL | Status: DC

## 2013-08-16 ENCOUNTER — Other Ambulatory Visit: Payer: Self-pay | Admitting: Physician Assistant

## 2013-09-09 IMAGING — CR DG CHEST 1V PORT
1 series · 1 of 1 positions shown · non-contrast
Comparison: Prior today

CLINICAL DATA: Acute respiratory failure.  On ventilator.  Central
line placement.

PORTABLE CHEST - 1 VIEW

[AP]
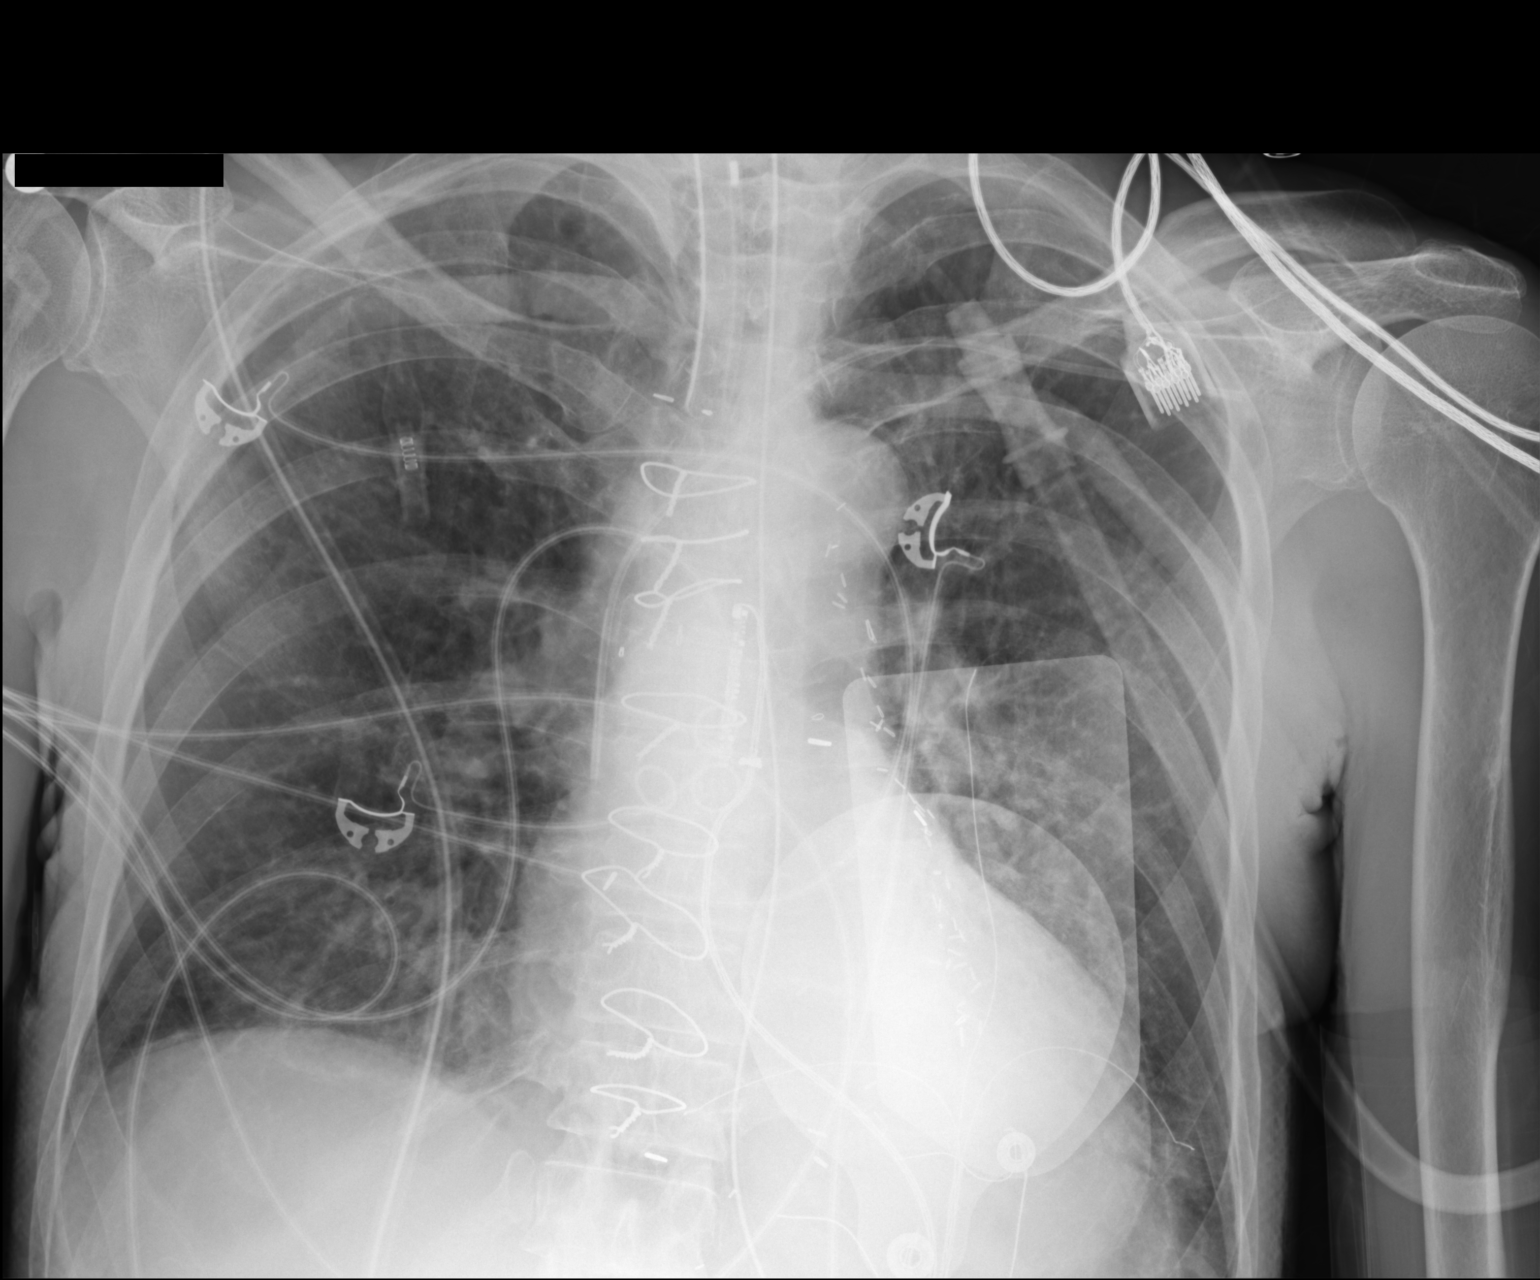

[1 of 1 positions shown; findings below may reference images not displayed]

FINDINGS: A new left subclavian center venous catheter is seen with
tip in the distal SVC.  Endotracheal tube and nasogastric tube
remain in appropriate position.  No pneumothorax identified.

Decreased lung volumes are seen with increased bibasilar
atelectasis.  Heart size is stable.  Prior CABG again noted.
IMPRESSION: 1.  New left subclavian center venous catheter in appropriate
position.  No evidence of pneumothorax.
2.  Increased bibasilar atelectasis.

## 2013-09-11 IMAGING — CT CT HEAD W/O CM
1 series · 16 of 30 positions shown, 20 images · non-contrast
Comparison: None.

CLINICAL DATA: Altered mental status.  Cardiac arrest.

CT HEAD WITHOUT CONTRAST
TECHNIQUE: Contiguous axial images were obtained from the base of
the skull through the vertex without contrast.

[Series 2: head trauma 4.8 h37s · axial · 0.45mm/px · z∈[-96,+56]mm · 16 of 35 slices shown, 20 images]
[im 2/35  brain]
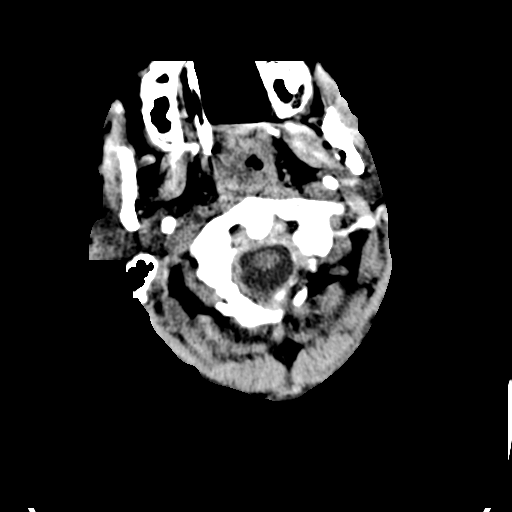
[im 2/35  bone]
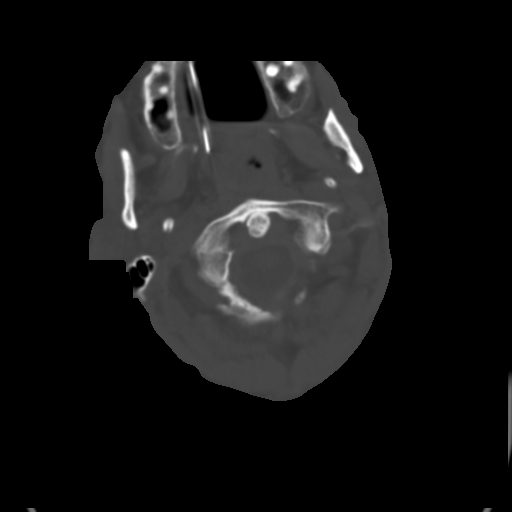
[im 4/35  brain]
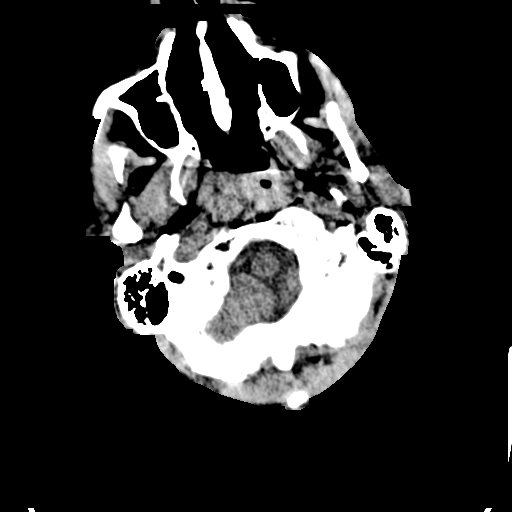
[im 6/35  brain]
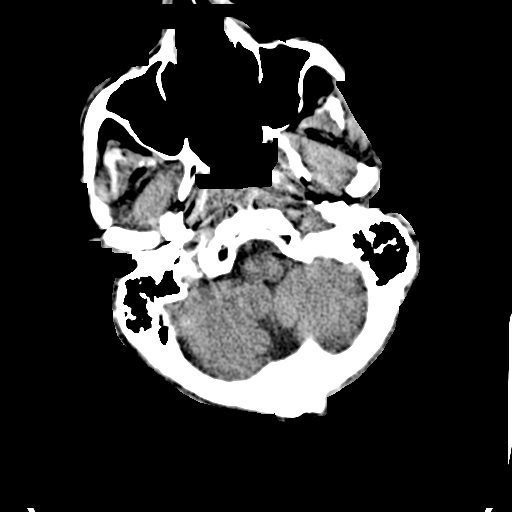
[im 9/35  brain]
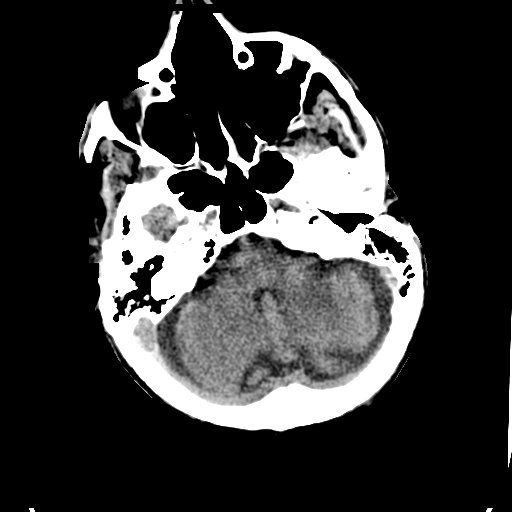
[im 10/35  brain]
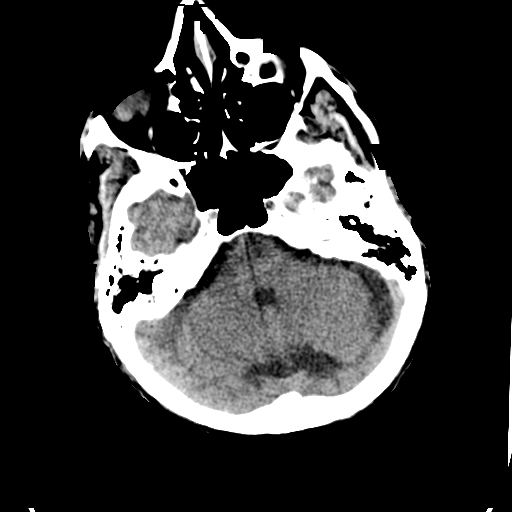
[im 10/35  bone]
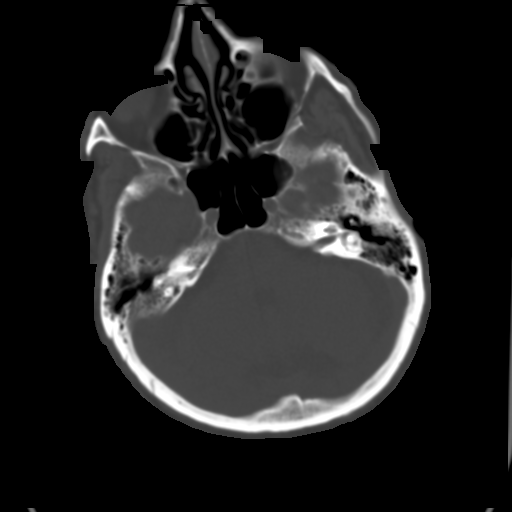
[im 12/35  brain]
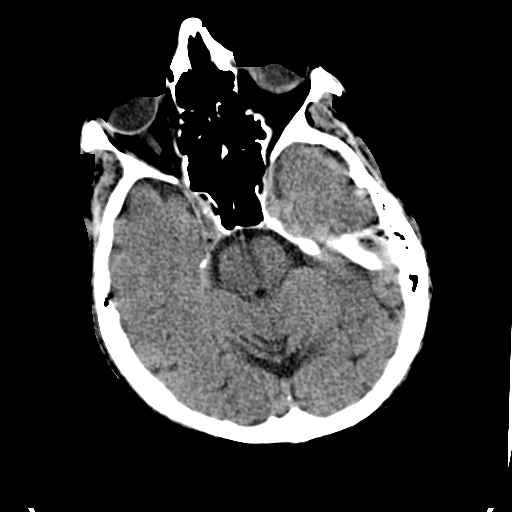
[im 15/35  brain]
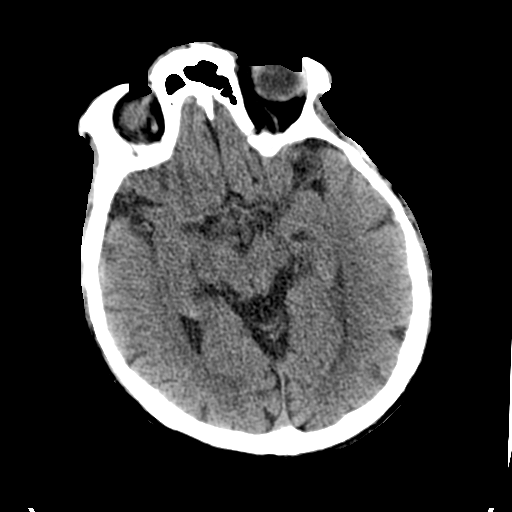
[im 17/35  brain]
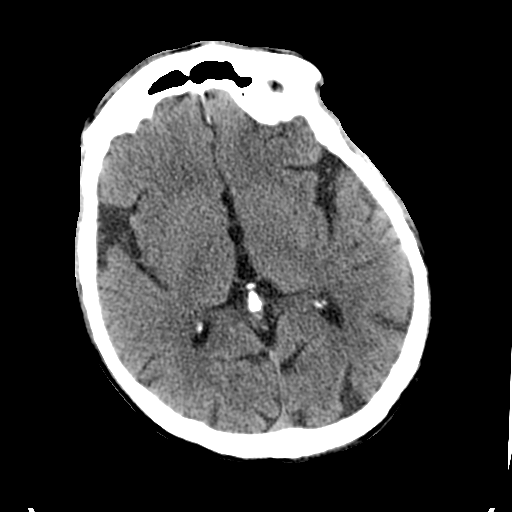
[im 18/35  brain]
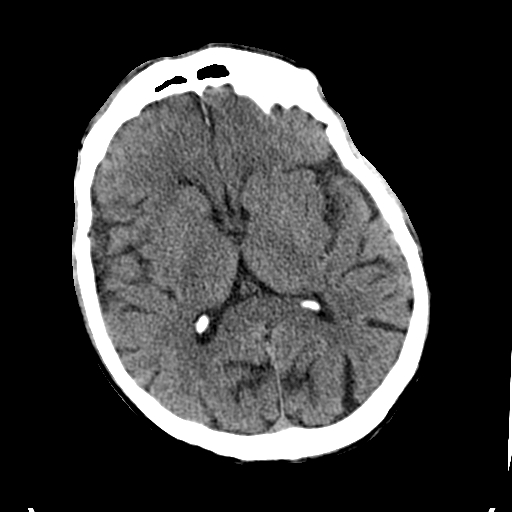
[im 18/35  bone]
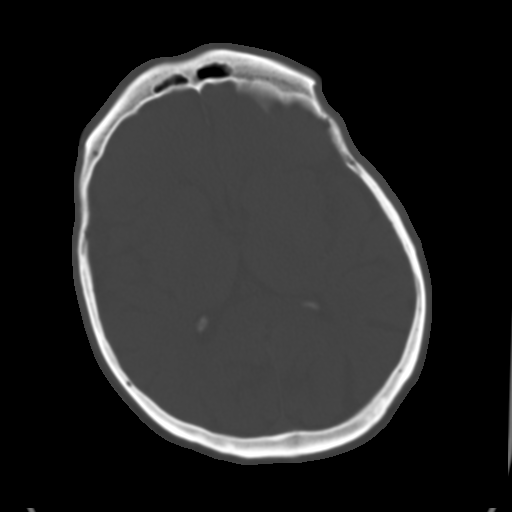
[im 20/35  brain]
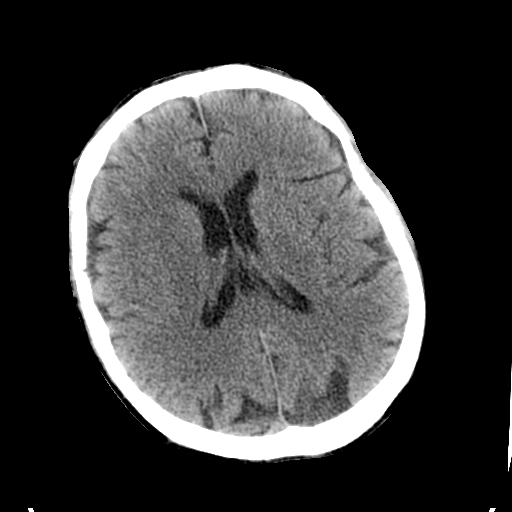
[im 23/35  brain]
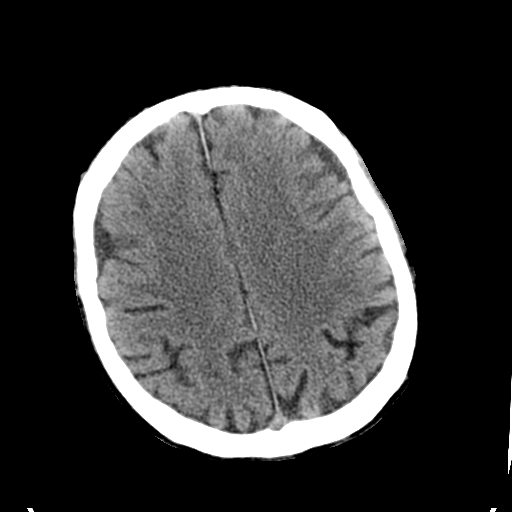
[im 25/35  brain]
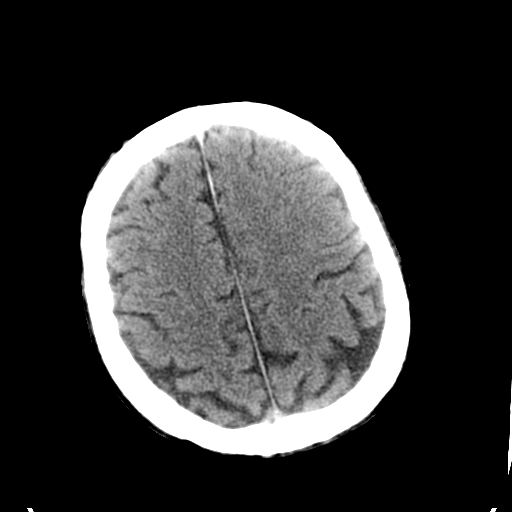
[im 26/35  brain]
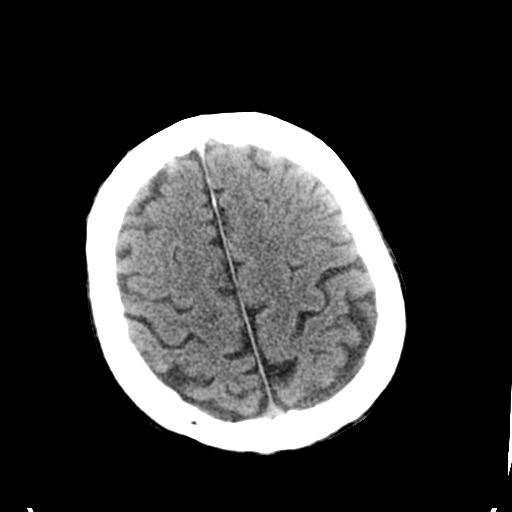
[im 26/35  bone]
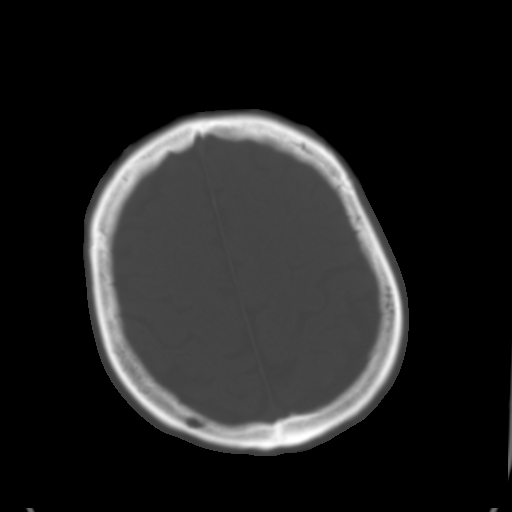
[im 29/35  brain]
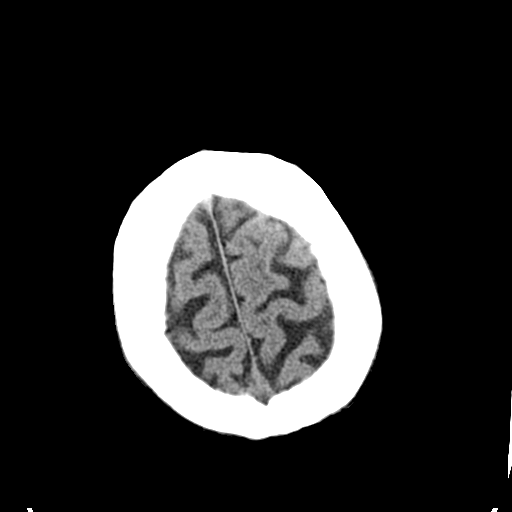
[im 31/35  brain]
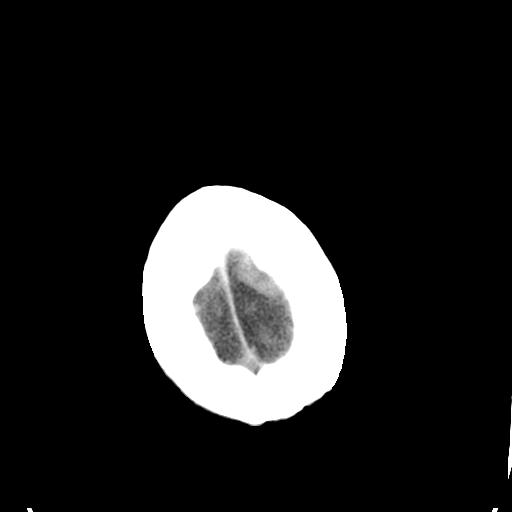
[im 33/35  brain]
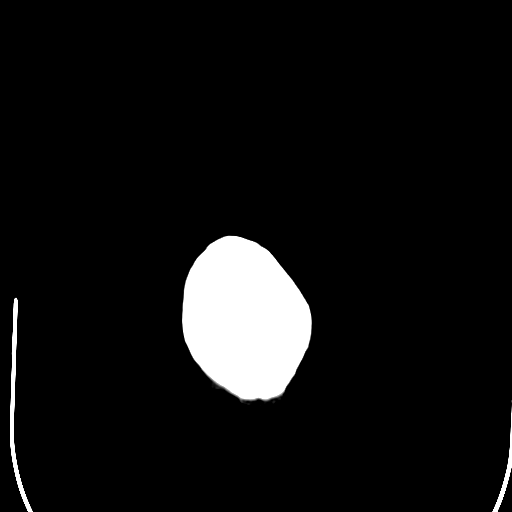

[16 of 30 positions shown; findings below may reference images not displayed]

FINDINGS: There is no acute intracranial hemorrhage, infarction, or
mass lesion.  There is mild diffuse cerebral cortical and
cerebellar atrophy.  No ventricular dilatation.  There is no brain
edema or appreciable small vessel disease.  Osseous structures are
normal.
IMPRESSION: Slight atrophy.  Otherwise, normal exam.  No brain edema.

## 2013-09-11 IMAGING — CT CT ANGIO CHEST
3 of 13 series · 18 of 46 positions shown · IV contrast (APPLIED)
Comparison: Chest x-ray dated 05/12/2012

CLINICAL DATA: Pulmonary infiltrates.  Cardiac arrest.

CT ANGIOGRAPHY CHEST
TECHNIQUE: Multidetector CT imaging of the chest using the
standard protocol during bolus administration of intravenous
contrast. Multiplanar reconstructed images including MIPs were
obtained and reviewed to evaluate the vascular anatomy.
Contrast: 100mL OMNIPAQUE IOHEXOL 300 MG/ML  SOLN

[Series 6: pulm embolism 1.0 st · axial · 0.66mm/px · z∈[-301,-41]mm · 14 of 301 slices shown]
[im 21/301  lung]
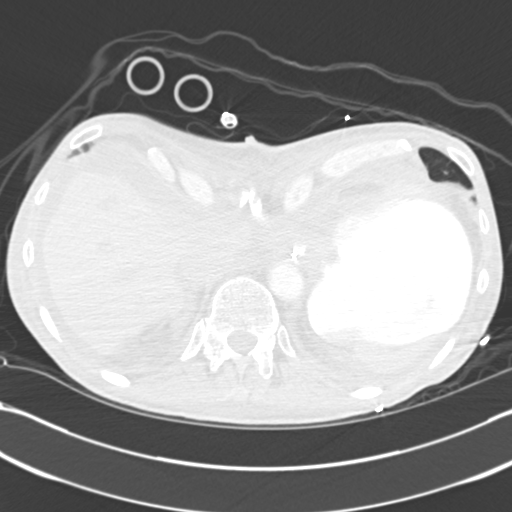
[im 41/301  soft-tissue]
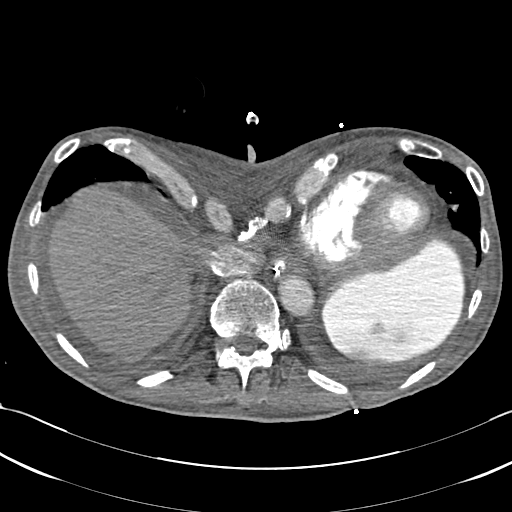
[im 61/301  lung]
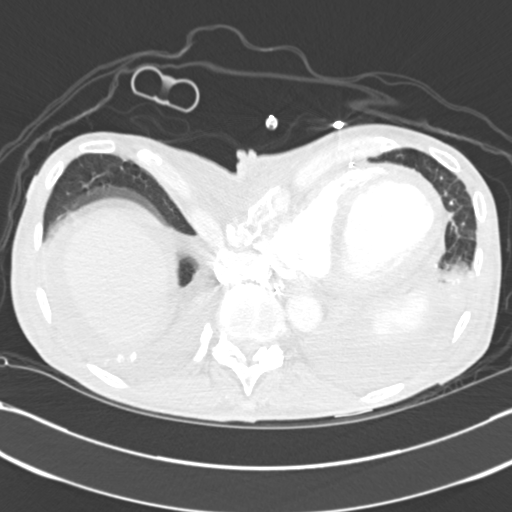
[im 81/301  soft-tissue]
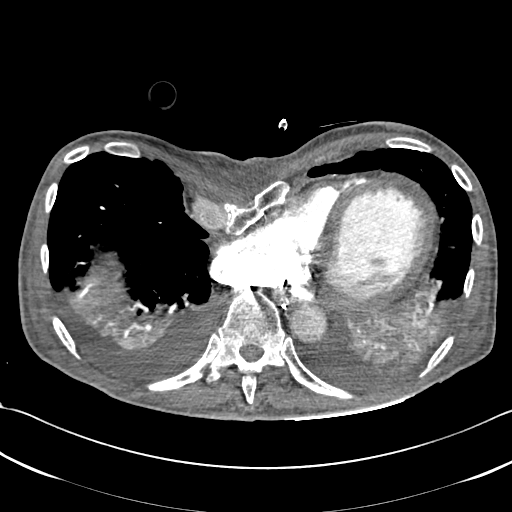
[im 101/301  lung]
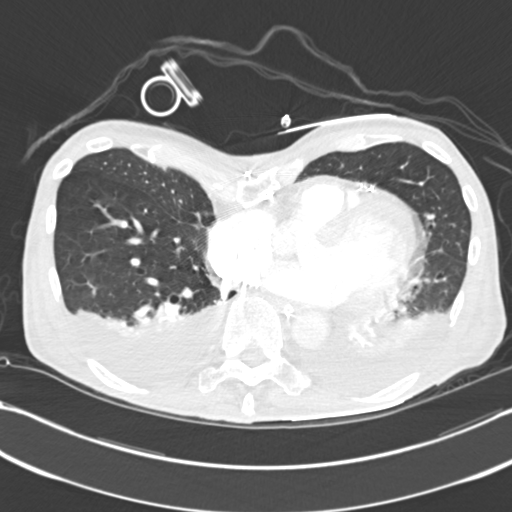
[im 121/301  soft-tissue]
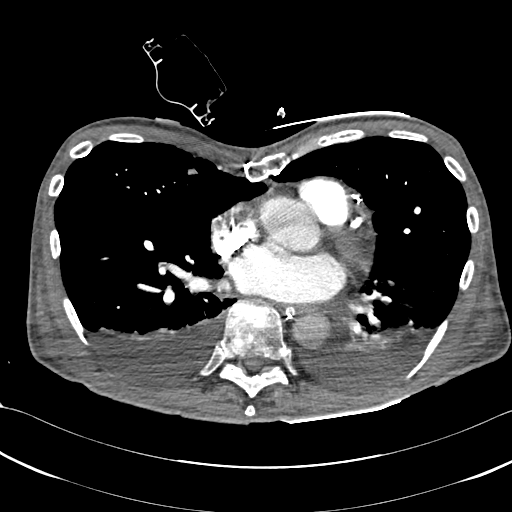
[im 141/301  lung]
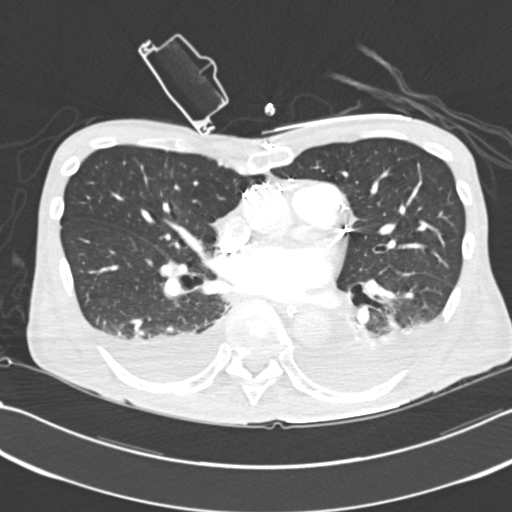
[im 161/301  soft-tissue]
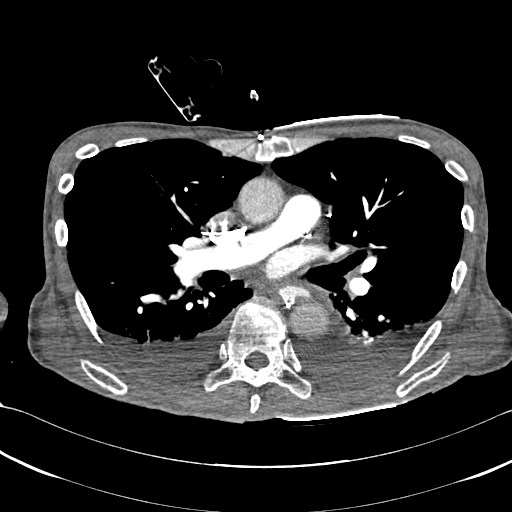
[im 181/301  lung]
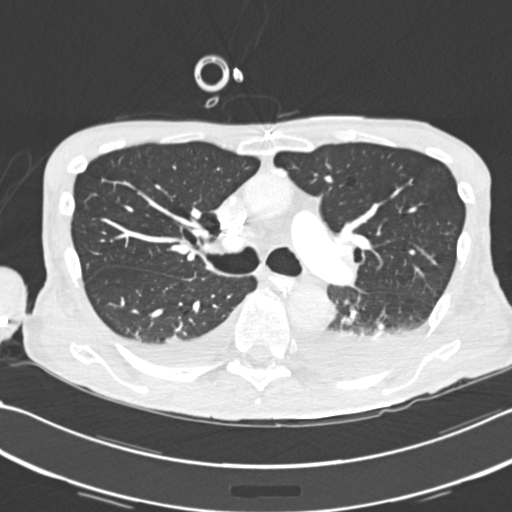
[im 201/301  soft-tissue]
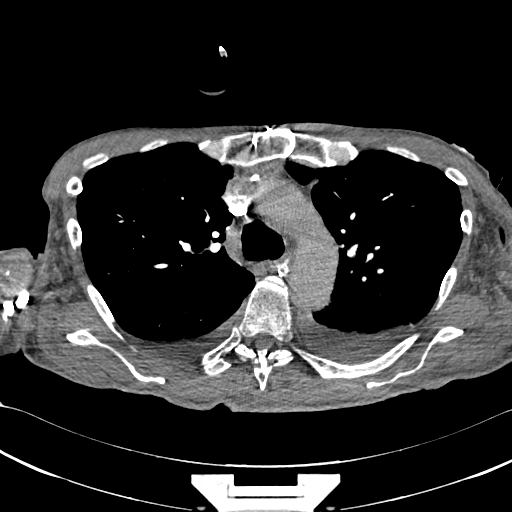
[im 221/301  lung]
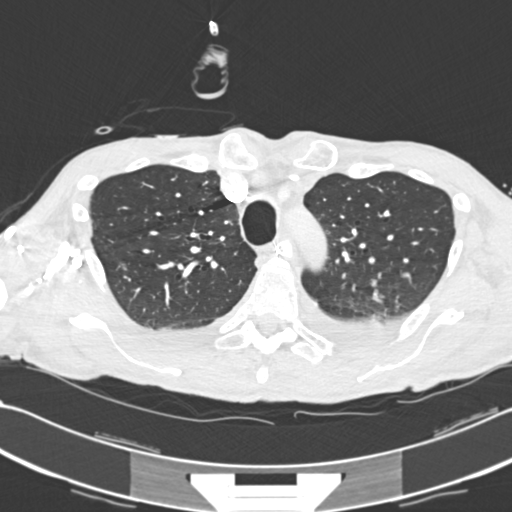
[im 241/301  soft-tissue]
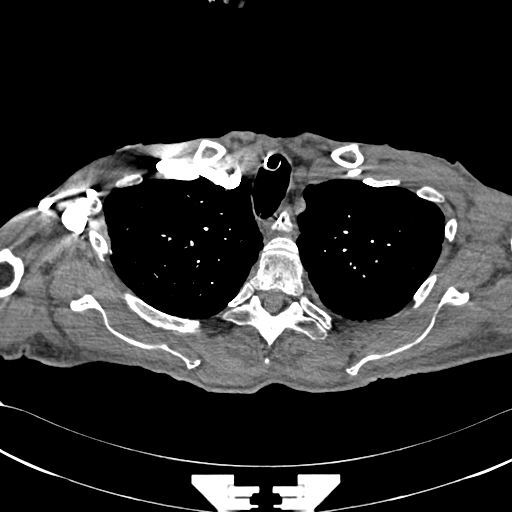
[im 261/301  lung]
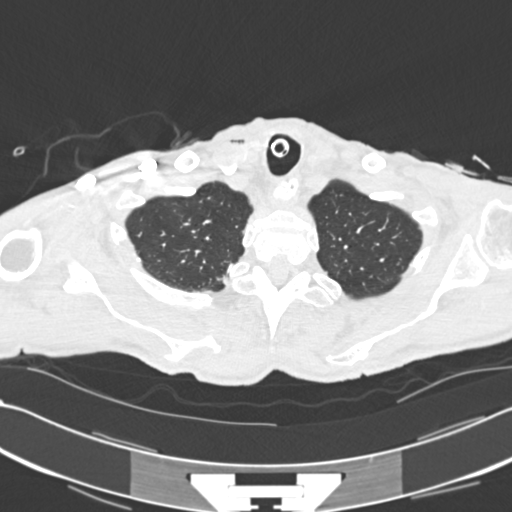
[im 281/301  soft-tissue]
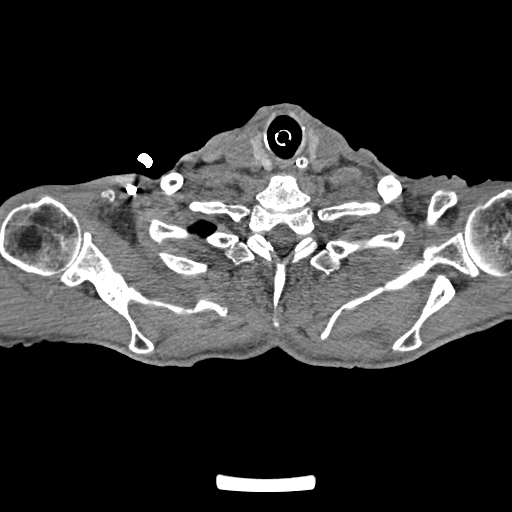

[Series 14: abd/pelv with 5.0 b31f st · axial · 0.68mm/px · z∈[-522,-322]mm · 3 of 82 slices shown]
[im 21/82  lung]
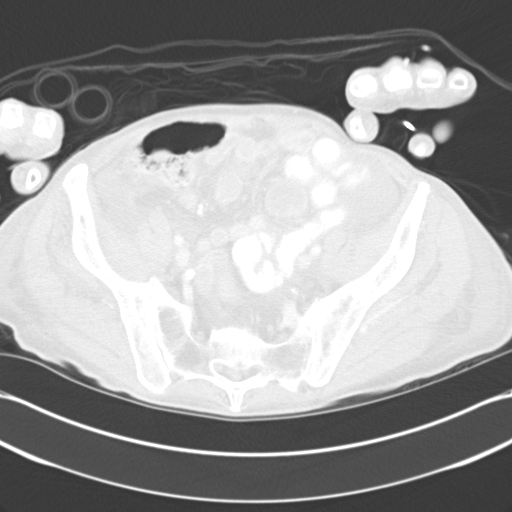
[im 41/82  lung]
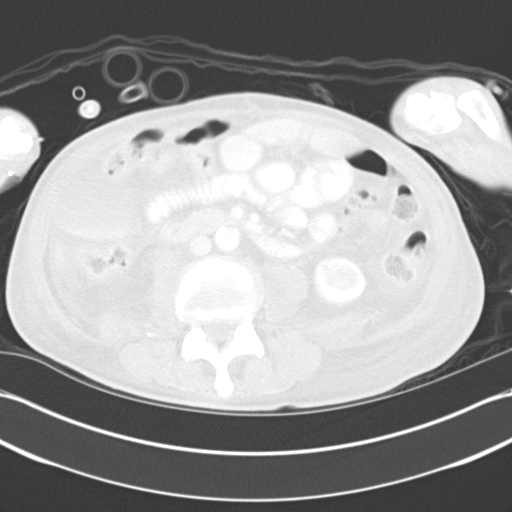
[im 61/82  lung]
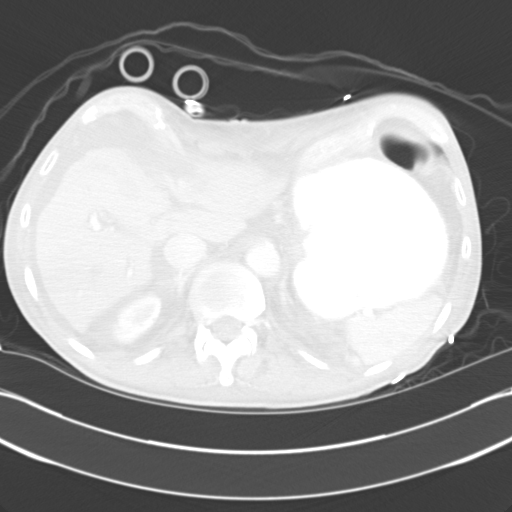

[Series 602: coronal mpr · coronal · 0.66mm/px · 1 of 69 slices shown]
[im 35/69  soft-tissue]
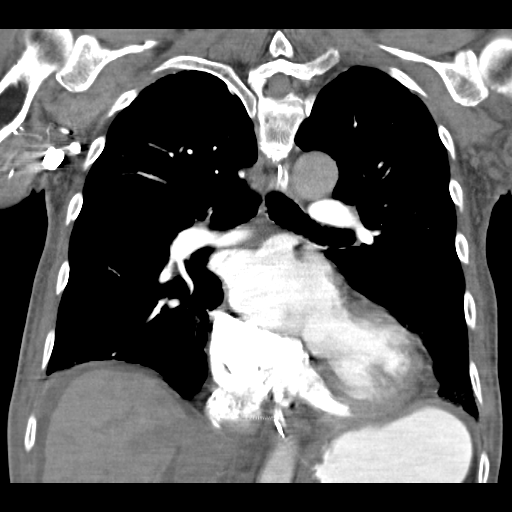

[18 of 46 positions shown; findings below may reference images not displayed]

FINDINGS: There is a single small pulmonary embolus and artery to
the lingula of the left lung.  No other pulmonary emboli.

There are moderate bilateral pleural effusions with secondary
atelectasis in the lower lobes.  The patient has a marked pectus
excavatum deformity.  Extensive coronary artery calcification.
Previous CABG.

No hilar or mediastinal adenopathy.   No acute osseous abnormality.

Ascites is noted in the upper abdomen.
IMPRESSION: 1.  Tiny pulmonary embolus in the lingula.  No other emboli.
2.  Moderate bilateral pleural effusions.

## 2013-09-20 ENCOUNTER — Encounter: Payer: Self-pay | Admitting: Internal Medicine

## 2013-09-20 ENCOUNTER — Ambulatory Visit (INDEPENDENT_AMBULATORY_CARE_PROVIDER_SITE_OTHER): Payer: Medicare Other | Admitting: Internal Medicine

## 2013-09-20 VITALS — BP 98/65 | HR 65 | Ht 71.0 in | Wt 134.0 lb

## 2013-09-20 DIAGNOSIS — I2589 Other forms of chronic ischemic heart disease: Secondary | ICD-10-CM

## 2013-09-20 DIAGNOSIS — I509 Heart failure, unspecified: Secondary | ICD-10-CM

## 2013-09-20 DIAGNOSIS — I2510 Atherosclerotic heart disease of native coronary artery without angina pectoris: Secondary | ICD-10-CM

## 2013-09-20 DIAGNOSIS — I5021 Acute systolic (congestive) heart failure: Secondary | ICD-10-CM

## 2013-09-20 DIAGNOSIS — I4901 Ventricular fibrillation: Secondary | ICD-10-CM

## 2013-09-20 DIAGNOSIS — I255 Ischemic cardiomyopathy: Secondary | ICD-10-CM

## 2013-09-20 DIAGNOSIS — Z95810 Presence of automatic (implantable) cardiac defibrillator: Secondary | ICD-10-CM

## 2013-09-20 DIAGNOSIS — I469 Cardiac arrest, cause unspecified: Secondary | ICD-10-CM

## 2013-09-20 LAB — MDC_IDC_ENUM_SESS_TYPE_INCLINIC
Battery Remaining Longevity: 90 mo
Brady Statistic RA Percent Paced: 18 %
Brady Statistic RV Percent Paced: 0.11 %
Date Time Interrogation Session: 20150415110310
HIGH POWER IMPEDANCE MEASURED VALUE: 65.25 Ohm
Implantable Pulse Generator Model: 2411
Implantable Pulse Generator Serial Number: 7094248
Lead Channel Impedance Value: 387.5 Ohm
Lead Channel Impedance Value: 487.5 Ohm
Lead Channel Pacing Threshold Amplitude: 0.75 V
Lead Channel Pacing Threshold Amplitude: 0.75 V
Lead Channel Pacing Threshold Amplitude: 0.75 V
Lead Channel Pacing Threshold Pulse Width: 0.5 ms
Lead Channel Pacing Threshold Pulse Width: 0.5 ms
Lead Channel Pacing Threshold Pulse Width: 0.5 ms
Lead Channel Setting Pacing Amplitude: 2 V
Lead Channel Setting Sensing Sensitivity: 0.5 mV
MDC IDC MSMT LEADCHNL RA PACING THRESHOLD PULSEWIDTH: 0.5 ms
MDC IDC MSMT LEADCHNL RA SENSING INTR AMPL: 3.7 mV
MDC IDC MSMT LEADCHNL RV PACING THRESHOLD AMPLITUDE: 0.75 V
MDC IDC MSMT LEADCHNL RV SENSING INTR AMPL: 11.4 mV
MDC IDC SET LEADCHNL RV PACING AMPLITUDE: 2.5 V
MDC IDC SET LEADCHNL RV PACING PULSEWIDTH: 0.5 ms
MDC IDC SET ZONE DETECTION INTERVAL: 270 ms
Zone Setting Detection Interval: 330 ms

## 2013-09-20 NOTE — Patient Instructions (Signed)
Your physician recommends that you continue on your current medications as directed. Please refer to the Current Medication list given to you today.  Remote monitoring is used to monitor your Pacemaker of ICD from home. This monitoring reduces the number of office visits required to check your device to one time per year. It allows [place] to keep an eye on the functioning of your device to ensure it is working properly. You are scheduled for a device check from home on 12/25/13. You may send your transmission at any time that day. If you have a wireless device, the transmission will be sent automatically. After your physician reviews your transmission, you will receive a postcard with your next transmission date.  Your physician wants you to follow-up in: 1 year with Dr. Rayann Heman.  You will receive a reminder letter in the mail two months in advance. If you don't receive a letter, please call our office to schedule the follow-up appointment.  Nira Conn, RN will contact you to explain and discuss following your device.

## 2013-09-20 NOTE — Progress Notes (Signed)
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  Since his last visit to our office, the patient reports doing very well.  Today, he denies symptoms of palpitations, chest pain, shortness of breath,  lower extremity edema, dizziness, presyncope, syncope, or ICD shocks.  The patient is otherwise without complaint today.   Past Medical History  Diagnosis Date  . CAD (coronary artery disease)     a. s/p remote CABG, b. AL STEMI c/b VF arrest in 05/2012 => LHC 05/10/12: Severe 3v CAD, S-OM1/2 ok, SVG-Dx ok, SVG-RCA occluded, LIMA-LAD atretic, proximal LAD occluded. EF 20% with anterior apical HK=> salvage PCI with POBA and thrombectomy of the occluded LAD;  c/b hypoxic encephalopathy  . HTN (hypertension)   . Depression   . Congenital funnel chest   . Osteoporosis   . SBO (small bowel obstruction) November 2013    s/p lysis of adhesions  . Pectus excavatum   . Anemia   . Clotting disorder   . Chronic systolic heart failure     a. Echo 12/13: EF 20-25%, anteroseptal, inferior, apical HK, mildly reduced RV function, trivial pericardial effusion;   b.  Echo 3/14:  Ant, septal, apical AK, EF 25%  . Ischemic cardiomyopathy   . Pulmonary embolism     a. after adhesiolysis for SBO in 04/2012 => coumadin for 6 mos  . Kidney stones   . ICD (implantable cardiac defibrillator) in place   . STEMI (ST elevation myocardial infarction) 05/2012  . Sinoatrial node dysfunction   . Cardiac arrest 05/2012  . Ventricular fibrillation 05/2012    . in setting of AL STEMI 12/13 => EF 20-25% => d/c on LifeVest (repeat echo planned in 08/2012)  . Pneumonia 05/2012  . Inguinal hernia     "have one now on my left side" (09/13/2012)  . Cataract    Past Surgical History  Procedure Laterality Date  . Transurethral resection of prostate    . Ptca      percutaneous transluminal coronary intervention and  brachy therapy, Bruce R. Olevia Perches, MD. EF 60%  . Laparotomy  04/27/2012    Procedure: EXPLORATORY LAPAROTOMY;  Surgeon: Gwenyth Ober, MD;  Location: [facility];  Service: General;  Laterality: N/A;  . Esophagogastroduodenoscopy  05/08/2012    Procedure: ESOPHAGOGASTRODUODENOSCOPY (EGD);  Surgeon: Juanita Craver, MD;  Location: [facility] ENDOSCOPY;  Service: Endoscopy;  Laterality: N/A;  Nevin Bloodgood put on for Dr. Collene Mares , Dr. Collene Mares will call if she wants another time  . Upper gastrointestinal endoscopy    . Colonoscopy    . Cardiac catheterization    . Cardiac defibrillator placement  09/13/2012    SJM Ellipse ER implanted by Dr Rayann Heman for secondary prevention  . Coronary artery bypass graft  1996    "CABG X5" (09/13/2012)  . Inguinal hernia repair Right 1996  . Cystoscopy/retrograde/ureteroscopy/stone extraction with basket  1990's    Current Outpatient Prescriptions  Medication Sig Dispense Refill  . alendronate (FOSAMAX) 70 MG tablet Take 70 mg by mouth every 7 (seven) days. Take with a full glass of water on an empty stomach.      Marland Kitchen aspirin EC 81 MG tablet Take 81 mg by mouth daily.      Marland Kitchen atorvastatin (LIPITOR) 40 MG tablet Take 1 tablet (40 mg total) by mouth daily.  30 tablet  5  . carvedilol (COREG) 6.25 MG tablet Take 1 tablet (6.25 mg  total) by mouth 2 (two) times daily with a meal.  60 tablet  12  . docusate sodium (COLACE) 100 MG capsule Take 100 mg by mouth 2 (two) times daily as needed for constipation.       . furosemide (LASIX) 40 MG tablet Take 1 tablet (40 mg total) by mouth daily.  45 tablet  5  . lisinopril (PRINIVIL,ZESTRIL) 2.5 MG tablet Take 1 tablet (2.5 mg total) by mouth daily.  30 tablet  5  . Psyllium (METAMUCIL PO) Take 15 mLs by mouth as needed. Dissolve in liquid and drink      . spironolactone (ALDACTONE) 25 MG tablet Take 1 tablet (25 mg total) by mouth daily.  30 tablet  12   No current facility-administered medications for this visit.    Physical Exam: Filed Vitals:   09/20/13  0944  BP: 98/65  Pulse: 65  Height: 5\' 11"  (1.803 m)  Weight: 134 lb (60.782 kg)    GEN- The patient is well appearing, alert and oriented x 3 today.   Head- normocephalic, atraumatic Eyes-  Sclera clear, conjunctiva pink Ears- hearing intact Oropharynx- clear Lungs- Clear to ausculation bilaterally, normal work of breathing Chest- ICD pocket is well healed Heart- Regular rate and rhythm, no murmurs, rubs or gallops, PMI not laterally displaced GI- soft, NT, ND, + BS Extremities- no clubbing, cyanosis, or edema  ICD interrogation- reviewed in detail today,  See PACEART report  Assessment and Plan:  1.  Prior VF arrest Normal ICD function  2. Chronic systolic dysfunction euvolemic today Stable on an appropriate medical regimen Normal ICD function See Pace Art report No changes today Will enroll in ICM device clinic with Alvis Lemmings  3. CAD No ischemic symptoms No changes today  Follow-up with Dr Stanford Breed as scheduled [place] I will see in the device clinic in 12 months

## 2013-10-09 ENCOUNTER — Encounter: Payer: Self-pay | Admitting: *Deleted

## 2013-10-23 ENCOUNTER — Encounter: Payer: Medicare Other | Admitting: *Deleted

## 2013-11-07 ENCOUNTER — Encounter: Payer: Self-pay | Admitting: Cardiology

## 2013-11-15 ENCOUNTER — Other Ambulatory Visit: Payer: Self-pay | Admitting: Family Medicine

## 2013-11-15 ENCOUNTER — Ambulatory Visit
Admission: RE | Admit: 2013-11-15 | Discharge: 2013-11-15 | Disposition: A | Discharge status: Home / Self Care | Payer: Medicare Other | Source: Ambulatory Visit | Attending: Family Medicine | Admitting: Family Medicine

## 2013-11-15 DIAGNOSIS — M879 Osteonecrosis, unspecified: Secondary | ICD-10-CM

## 2013-12-18 ENCOUNTER — Telehealth: Payer: Self-pay | Admitting: Hematology and Oncology

## 2013-12-18 NOTE — Telephone Encounter (Signed)
S/W PATIENT AND GAVE NP APPT FOR 07/21 @ 10:45 W/DR. Nathalie.  REFERRING DR. Claris Gower DX- ANEMIA

## 2013-12-25 ENCOUNTER — Encounter: Payer: Medicare Other | Admitting: *Deleted

## 2013-12-25 ENCOUNTER — Telehealth: Payer: Self-pay | Admitting: Internal Medicine

## 2013-12-25 ENCOUNTER — Telehealth: Payer: Self-pay | Admitting: Cardiology

## 2013-12-25 NOTE — Telephone Encounter (Signed)
New message     Pt calling to tell us that he will try to transmit remotely tomorrow.  He has been having trouble transmitting, but his son is coming over tomorrow and will do it then.

## 2013-12-25 NOTE — Telephone Encounter (Signed)
Attempted to call pt and remind pt of remote transmission. No answer

## 2013-12-26 ENCOUNTER — Encounter: Payer: Self-pay | Admitting: Hematology and Oncology

## 2013-12-26 ENCOUNTER — Ambulatory Visit (HOSPITAL_BASED_OUTPATIENT_CLINIC_OR_DEPARTMENT_OTHER): Payer: Medicare Other

## 2013-12-26 ENCOUNTER — Encounter: Payer: Self-pay | Admitting: Cardiology

## 2013-12-26 ENCOUNTER — Ambulatory Visit (HOSPITAL_BASED_OUTPATIENT_CLINIC_OR_DEPARTMENT_OTHER): Payer: Medicare Other | Admitting: Hematology and Oncology

## 2013-12-26 VITALS — BP 101/71 | HR 64 | Temp 97.7°F | Resp 18 | Ht 71.0 in | Wt 132.4 lb

## 2013-12-26 DIAGNOSIS — I10 Essential (primary) hypertension: Secondary | ICD-10-CM

## 2013-12-26 DIAGNOSIS — N182 Chronic kidney disease, stage 2 (mild): Secondary | ICD-10-CM | POA: Insufficient documentation

## 2013-12-26 DIAGNOSIS — M810 Age-related osteoporosis without current pathological fracture: Secondary | ICD-10-CM

## 2013-12-26 DIAGNOSIS — D631 Anemia in chronic kidney disease: Secondary | ICD-10-CM

## 2013-12-26 DIAGNOSIS — I2510 Atherosclerotic heart disease of native coronary artery without angina pectoris: Secondary | ICD-10-CM

## 2013-12-26 DIAGNOSIS — N189 Chronic kidney disease, unspecified: Secondary | ICD-10-CM | POA: Insufficient documentation

## 2013-12-26 DIAGNOSIS — D649 Anemia, unspecified: Secondary | ICD-10-CM

## 2013-12-26 DIAGNOSIS — N039 Chronic nephritic syndrome with unspecified morphologic changes: Principal | ICD-10-CM

## 2013-12-26 NOTE — Progress Notes (Signed)
Checked in new patient with no financial issues prior to seeing the dr. He has not been out of country and he has appt card.

## 2013-12-26 NOTE — Progress Notes (Signed)
[place] NOTE  Patient Care Team: [name], MD as PCP - General (Family Medicine)  CHIEF COMPLAINTS/PURPOSE OF CONSULTATION:  Chronic anemia  HISTORY OF PRESENTING ILLNESS:  Louis Reid 80 y.o. male is here because of chronic anemia.  He was found to have abnormal CBC from routine blood work. His last normal hemoglobin was 13.3 in 2013. Since then, his hemoglobin has ranged from 8.8 to about 11. Iron studies performed recently with his physician office are within normal limits. Serum creatinine was elevated at 1.66. He denies recent chest pain on exertion, shortness of breath on minimal exertion, pre-syncopal episodes, or palpitations. He felt that his legs gets weak easily with minimal exertion. He has chronic claudication when he mobilized around 30 feet. He has chronic leg edema. In 2013, EGD and colonoscopy show esophagitis, gastritis but no evidence of cancer. He had not noticed any recent bleeding such as epistaxis, hematuria or hematochezia The patient denies over the counter NSAID ingestion. He is on antiplatelets agents.  He had no prior history or diagnosis of cancer. His age appropriate screening programs are up-to-date. He denies any pica and eats a variety of diet. He never donated blood or received blood transfusion MEDICAL HISTORY:  Past Medical History  Diagnosis Date  . CAD (coronary artery disease)     a. s/p remote CABG, b. AL STEMI c/b VF arrest in 05/2012 => LHC 05/10/12: Severe 3v CAD, S-OM1/2 ok, SVG-Dx ok, SVG-RCA occluded, LIMA-LAD atretic, proximal LAD occluded. EF 20% with anterior apical HK=> salvage PCI with POBA and thrombectomy of the occluded LAD;  c/b hypoxic encephalopathy  . HTN (hypertension)   . Depression   . Congenital funnel chest   . Osteoporosis   . SBO (small bowel obstruction) November 2013    s/p lysis of adhesions  . Pectus excavatum   . Anemia   . Clotting disorder   . Chronic systolic heart  failure     a. Echo 12/13: EF 20-25%, anteroseptal, inferior, apical HK, mildly reduced RV function, trivial pericardial effusion;   b.  Echo 3/14:  Ant, septal, apical AK, EF 25%  . Ischemic cardiomyopathy   . Pulmonary embolism     a. after adhesiolysis for SBO in 04/2012 => coumadin for 6 mos  . Kidney stones   . ICD (implantable cardiac defibrillator) in place   . STEMI (ST elevation myocardial infarction) 05/2012  . Sinoatrial node dysfunction   . Cardiac arrest 05/2012  . Ventricular fibrillation 05/2012    . in setting of AL STEMI 12/13 => EF 20-25% => d/c on LifeVest (repeat echo planned in 08/2012)  . Pneumonia 05/2012  . Inguinal hernia     "have one now on my left side" (09/13/2012)  . Cataract     SURGICAL HISTORY: Past Surgical History  Procedure Laterality Date  . Transurethral resection of prostate    . Ptca      percutaneous transluminal coronary intervention and brachy therapy, Bruce R. Olevia Perches, MD. EF 60%  . Laparotomy  04/27/2012    Procedure: EXPLORATORY LAPAROTOMY;  Surgeon: Gwenyth Ober, MD;  Location: [facility];  Service: General;  Laterality: N/A;  . Esophagogastroduodenoscopy  05/08/2012    Procedure: ESOPHAGOGASTRODUODENOSCOPY (EGD);  Surgeon: Juanita Craver, MD;  Location: [facility] ENDOSCOPY;  Service: Endoscopy;  Laterality: N/A;  Nevin Bloodgood put on for Dr. Collene Mares , Dr. Collene Mares will call if she wants another time  . Upper gastrointestinal endoscopy    . Colonoscopy    .  Cardiac catheterization    . Cardiac defibrillator placement  09/13/2012    SJM Ellipse ER implanted by Dr Rayann Heman for secondary prevention  . Coronary artery bypass graft  1996    "CABG X5" (09/13/2012)  . Inguinal hernia repair Right 1996  . Cystoscopy/retrograde/ureteroscopy/stone extraction with basket  1990's    SOCIAL HISTORY: History   Social History  . Marital Status: Widowed    Spouse Name: N/A    Number of Children: 1  . Years of Education: N/A   Occupational History  . [name]     Social History Main Topics  . Smoking status: Former Smoker    Types: Cigarettes  . Smokeless tobacco: Never Used     Comment: 09/14/2011 "quit smoking when I was ~ 17; probably didn't smoke 10 packs in my life"  . Alcohol Use: No  . Drug Use: No  . Sexual Activity: Not Currently   Other Topics Concern  . Not on file   Social History Narrative  . No narrative on file    FAMILY HISTORY: Family History  Problem Relation Age of Onset  . Colon cancer Brother   . Cancer Brother     Liver  . Heart disease Father   . Heart disease Brother   . Other Mother     brain tumor    ALLERGIES:  is allergic to cold medicine plus.  MEDICATIONS:  Current Outpatient Prescriptions  Medication Sig Dispense Refill  . alendronate (FOSAMAX) 70 MG tablet Take 70 mg by mouth every 7 (seven) days. Take with a full glass of water on an empty stomach.      Marland Kitchen aspirin EC 81 MG tablet Take 81 mg by mouth daily.      Marland Kitchen atorvastatin (LIPITOR) 40 MG tablet Take 1 tablet (40 mg total) by mouth daily.  30 tablet  5  . carvedilol (COREG) 6.25 MG tablet Take 1 tablet (6.25 mg total) by mouth 2 (two) times daily with a meal.  60 tablet  12  . docusate sodium (COLACE) 100 MG capsule Take 100 mg by mouth 2 (two) times daily as needed for constipation.       . DUREZOL 0.05 % EMUL       . furosemide (LASIX) 40 MG tablet Take 1 tablet (40 mg total) by mouth daily.  45 tablet  5  . lisinopril (PRINIVIL,ZESTRIL) 2.5 MG tablet Take 1 tablet (2.5 mg total) by mouth daily.  30 tablet  5  . Multiple Vitamins-Minerals (PRESERVISION AREDS 2 PO) Take by mouth 2 (two) times daily.      . Psyllium (METAMUCIL PO) Take 15 mLs by mouth as needed. Dissolve in liquid and drink      . spironolactone (ALDACTONE) 25 MG tablet Take 1 tablet (25 mg total) by mouth daily.  30 tablet  12  . VIGAMOX 0.5 % ophthalmic solution        No current facility-administered medications for this visit.    REVIEW OF SYSTEMS:   Constitutional:  Denies fevers, chills or abnormal night sweats Eyes: Denies blurriness of vision, double vision or watery eyes Ears, nose, mouth, throat, and face: Denies mucositis or sore throat Respiratory: Denies cough, dyspnea or wheezes Cardiovascular: Denies palpitation, chest discomfort Gastrointestinal:  Denies nausea, heartburn or change in bowel habits Skin: Denies abnormal skin rashes Lymphatics: Denies new lymphadenopathy or easy bruising Behavioral/Psych: Mood is stable, no new changes  All other systems were reviewed with the patient and are negative.  PHYSICAL EXAMINATION: ECOG PERFORMANCE STATUS: 1 - Symptomatic but completely ambulatory  Filed Vitals:   12/26/13 1111  BP: 101/71  Pulse: 64  Temp: 97.7 F (36.5 C)  Resp: 18   Filed Weights   12/26/13 1111  Weight: 132 lb 6.4 oz (60.056 kg)    GENERAL:alert, no distress and comfortable. He looks thin but not cachectic SKIN: skin color, texture, turgor are normal, no rashes or significant lesions EYES: normal, conjunctiva are pink and non-injected, sclera clear OROPHARYNX:no exudate, no erythema and lips, buccal mucosa, and tongue normal  NECK: supple, thyroid normal size, non-tender, without nodularity LYMPH:  no palpable lymphadenopathy in the cervical, axillary or inguinal LUNGS: clear to auscultation and percussion with normal breathing effort HEART: regular rate & rhythm with a soft systolic murmur and minimum bilateral lower extremity edema ABDOMEN:abdomen soft, non-tender and normal bowel sounds Musculoskeletal:no cyanosis of digits and no clubbing  PSYCH: alert & oriented x 3 with fluent speech NEURO: no focal motor/sensory deficits  LABORATORY DATA:  I have reviewed the data as listed ASSESSMENT & PLAN:  Anemia in chronic kidney disease(285.21) The cause of his anemia is multifactorial, likely a combination of anemia chronic disease and chronic kidney disease. Since his hemoglobin well above 10 g, I would not  advocate the use of erythropoietin stimulating agents. The patient is comfortable to have his blood checked minimum twice a year with his other providers. If his kidney function deteriorates further in the future and his hemoglobin start to drop to less than 10 g, I will see him back and we can discuss further about the use of erythropoeitin stimulating agents.  Chronic kidney disease This is likely related to ischemic cardiomyopathy. This is likely the cause of his anemia. Recommend observation only at this point.      All questions were answered. The patient knows to call the clinic with any problems, questions or concerns. I spent 30 minutes counseling the patient face to face. The total time spent in the appointment was 40 minutes and more than 50% was on counseling.     [facility], [place], MD 12/26/2013 9:16 PM

## 2013-12-26 NOTE — Assessment & Plan Note (Signed)
This is likely related to ischemic cardiomyopathy. This is likely the cause of his anemia. Recommend observation only at this point.

## 2013-12-26 NOTE — Assessment & Plan Note (Signed)
The cause of his anemia is multifactorial, likely a combination of anemia chronic disease and chronic kidney disease. Since his hemoglobin well above 10 g, I would not advocate the use of erythropoietin stimulating agents. The patient is comfortable to have his blood checked minimum twice a year with his other providers. If his kidney function deteriorates further in the future and his hemoglobin start to drop to less than 10 g, I will see him back and we can discuss further about the use of erythropoeitin stimulating agents.

## 2014-01-22 ENCOUNTER — Encounter: Payer: Self-pay | Admitting: *Deleted

## 2014-01-22 ENCOUNTER — Encounter (HOSPITAL_COMMUNITY): Payer: Self-pay | Admitting: *Deleted

## 2014-01-22 ENCOUNTER — Encounter: Payer: Self-pay | Admitting: Cardiology

## 2014-01-22 ENCOUNTER — Ambulatory Visit (INDEPENDENT_AMBULATORY_CARE_PROVIDER_SITE_OTHER): Payer: Medicare Other | Admitting: Cardiology

## 2014-01-22 VITALS — BP 90/60 | HR 66 | Ht 71.0 in | Wt 129.9 lb

## 2014-01-22 DIAGNOSIS — Z95810 Presence of automatic (implantable) cardiac defibrillator: Secondary | ICD-10-CM

## 2014-01-22 DIAGNOSIS — E785 Hyperlipidemia, unspecified: Secondary | ICD-10-CM

## 2014-01-22 DIAGNOSIS — I2589 Other forms of chronic ischemic heart disease: Secondary | ICD-10-CM

## 2014-01-22 DIAGNOSIS — I739 Peripheral vascular disease, unspecified: Secondary | ICD-10-CM

## 2014-01-22 DIAGNOSIS — I255 Ischemic cardiomyopathy: Secondary | ICD-10-CM

## 2014-01-22 DIAGNOSIS — I2510 Atherosclerotic heart disease of native coronary artery without angina pectoris: Secondary | ICD-10-CM

## 2014-01-22 DIAGNOSIS — I10 Essential (primary) hypertension: Secondary | ICD-10-CM

## 2014-01-22 NOTE — Assessment & Plan Note (Signed)
Followed by electrophysiology. 

## 2014-01-22 NOTE — Patient Instructions (Signed)
Your physician wants you to follow-up in: [place] will receive a reminder letter in the mail two months in advance. If you don't receive a letter, please call our office to schedule the follow-up appointment.   Your physician has requested that you have a lexiscan myoview. For further information please visit [email or web address]. Please follow instruction sheet, as given.   Your physician has requested that you have a lower extremity arterial duplex. During this test, ultrasound are used to evaluate arterial blood flow in the legs. Allow one hour for this exam. There are no restrictions or special instructions.   Your physician recommends that you return for lab work WHEN FASTING

## 2014-01-22 NOTE — Progress Notes (Signed)
HPI: FU coronary artery disease and ischemic cardiomyopathy. Patient is status post coronary artery bypass graft in 1996. Patient had an anterior infarct in December of 7124 complicated by ventricular fibrillation arrest. Cardiac catheterization revealed sequential saphenous vein graft to the OM1 and OM2 patent, saphenous vein graft to diagonal patent, saphenous vein graft to the right coronary occluded and LIMA to the LAD atretic. The proximal LAD was occluded. Ejection fraction 20%. The patient had PCI of his LAD. Course complicated by hypoxic encephalopathy. Echocardiogram repeated in March of 2014. Ejection fraction 25%. He is status post ICD 4/14. Since last seen, The patient denies dyspnea on exertion, orthopnea, PND or pedal edema. He has some fatigue in his legs bilaterally with walking up inclines. He notes an occasional discomfort in his upper chest with standing and continues with ambulation.   Current Outpatient Prescriptions  Medication Sig Dispense Refill  . cholecalciferol (VITAMIN D) 1000 UNITS tablet Take 1,000 Units by mouth daily.      Marland Kitchen aspirin EC 81 MG tablet Take 81 mg by mouth daily.      Marland Kitchen atorvastatin (LIPITOR) 40 MG tablet Take 1 tablet (40 mg total) by mouth daily.  30 tablet  5  . carvedilol (COREG) 6.25 MG tablet Take 1 tablet (6.25 mg total) by mouth 2 (two) times daily with a meal.  60 tablet  12  . docusate sodium (COLACE) 100 MG capsule Take 100 mg by mouth 2 (two) times daily as needed for constipation.       . DUREZOL 0.05 % EMUL       . furosemide (LASIX) 40 MG tablet Take 1 tablet (40 mg total) by mouth daily.  45 tablet  5  . lisinopril (PRINIVIL,ZESTRIL) 2.5 MG tablet Take 1 tablet (2.5 mg total) by mouth daily.  30 tablet  5  . Multiple Vitamins-Minerals (PRESERVISION AREDS 2 PO) Take by mouth 2 (two) times daily.      . Psyllium (METAMUCIL PO) Take 15 mLs by mouth as needed. Dissolve in liquid and drink      . spironolactone (ALDACTONE) 25 MG tablet  Take 1 tablet (25 mg total) by mouth daily.  30 tablet  12  . VIGAMOX 0.5 % ophthalmic solution        No current facility-administered medications for this visit.     Past Medical History  Diagnosis Date  . CAD (coronary artery disease)     a. s/p remote CABG, b. AL STEMI c/b VF arrest in 05/2012 => LHC 05/10/12: Severe 3v CAD, S-OM1/2 ok, SVG-Dx ok, SVG-RCA occluded, LIMA-LAD atretic, proximal LAD occluded. EF 20% with anterior apical HK=> salvage PCI with POBA and thrombectomy of the occluded LAD;  c/b hypoxic encephalopathy  . HTN (hypertension)   . Depression   . Congenital funnel chest   . Osteoporosis   . SBO (small bowel obstruction) November 2013    s/p lysis of adhesions  . Pectus excavatum   . Anemia   . Clotting disorder   . Chronic systolic heart failure     a. Echo 12/13: EF 20-25%, anteroseptal, inferior, apical HK, mildly reduced RV function, trivial pericardial effusion;   b.  Echo 3/14:  Ant, septal, apical AK, EF 25%  . Ischemic cardiomyopathy   . Pulmonary embolism     a. after adhesiolysis for SBO in 04/2012 => coumadin for 6 mos  . Kidney stones   . ICD (implantable cardiac defibrillator) in place   . STEMI (ST elevation  myocardial infarction) 05/2012  . Sinoatrial node dysfunction   . Cardiac arrest 05/2012  . Ventricular fibrillation 05/2012    . in setting of AL STEMI 12/13 => EF 20-25% => d/c on LifeVest (repeat echo planned in 08/2012)  . Pneumonia 05/2012  . Inguinal hernia     "have one now on my left side" (09/13/2012)  . Cataract     Past Surgical History  Procedure Laterality Date  . Transurethral resection of prostate    . Ptca      percutaneous transluminal coronary intervention and brachy therapy, Bruce R. Olevia Perches, MD. EF 60%  . Laparotomy  04/27/2012    Procedure: EXPLORATORY LAPAROTOMY;  Surgeon: Gwenyth Ober, MD;  Location: [place];  Service: General;  Laterality: N/A;  . Esophagogastroduodenoscopy  05/08/2012    Procedure:  ESOPHAGOGASTRODUODENOSCOPY (EGD);  Surgeon: Juanita Craver, MD;  Location: [facility] ENDOSCOPY;  Service: Endoscopy;  Laterality: N/A;  Nevin Bloodgood put on for Dr. Collene Mares , Dr. Collene Mares will call if she wants another time  . Upper gastrointestinal endoscopy    . Colonoscopy    . Cardiac catheterization    . Cardiac defibrillator placement  09/13/2012    SJM Ellipse ER implanted by Dr Rayann Heman for secondary prevention  . Coronary artery bypass graft  1996    "CABG X5" (09/13/2012)  . Inguinal hernia repair Right 1996  . Cystoscopy/retrograde/ureteroscopy/stone extraction with basket  1990's    History   Social History  . Marital Status: Widowed    Spouse Name: N/A    Number of Children: 1  . Years of Education: N/A   Occupational History  . [name]    Social History Main Topics  . Smoking status: Former Smoker    Types: Cigarettes  . Smokeless tobacco: Never Used     Comment: 09/14/2011 "quit smoking when I was ~ 17; probably didn't smoke 10 packs in my life"  . Alcohol Use: No  . Drug Use: No  . Sexual Activity: Not Currently   Other Topics Concern  . Not on file   Social History Narrative  . No narrative on file    ROS: no fevers or chills, productive cough, hemoptysis, dysphasia, odynophagia, melena, hematochezia, dysuria, hematuria, rash, seizure activity, orthopnea, PND, pedal edema, claudication. Remaining systems are negative.  Physical Exam: Well-developed well-nourished in no acute distress.  Skin is warm and dry.  HEENT is normal.  Neck is supple.  Chest is clear to auscultation with normal expansion. Pectus excavatum Cardiovascular exam is regular rate and rhythm.  Abdominal exam nontender or distended. No masses palpated. Extremities show no edema. neuro grossly intact  ECG Atrial paced rhythm. RV conduction delay. Anterior T-wave inversion. Inferior MI.

## 2014-01-22 NOTE — Assessment & Plan Note (Signed)
Continued ACE inhibitor and beta blocker.

## 2014-01-22 NOTE — Assessment & Plan Note (Signed)
Patient with question claudication. Schedule ABIs with doppler

## 2014-01-22 NOTE — Assessment & Plan Note (Signed)
Continue aspirin and statin. He is having vague upper chest discomfort. Schedule nuclear study for risk stratification.

## 2014-01-22 NOTE — Assessment & Plan Note (Signed)
Continue statin. Check lipids and liver. 

## 2014-01-22 NOTE — Assessment & Plan Note (Signed)
Blood pressure controlled. Continue present medications. Check potassium and renal function. 

## 2014-02-01 ENCOUNTER — Telehealth (HOSPITAL_COMMUNITY): Payer: Self-pay

## 2014-02-01 NOTE — Telephone Encounter (Signed)
Encounter complete. 

## 2014-02-02 ENCOUNTER — Other Ambulatory Visit: Payer: Self-pay | Admitting: *Deleted

## 2014-02-02 DIAGNOSIS — R0602 Shortness of breath: Secondary | ICD-10-CM

## 2014-02-06 ENCOUNTER — Ambulatory Visit (HOSPITAL_COMMUNITY)
Admission: RE | Admit: 2014-02-06 | Discharge: 2014-02-06 | Disposition: A | Discharge status: Home / Self Care | Payer: Medicare Other | Source: Ambulatory Visit | Attending: Cardiology | Admitting: Cardiology

## 2014-02-06 ENCOUNTER — Ambulatory Visit (HOSPITAL_BASED_OUTPATIENT_CLINIC_OR_DEPARTMENT_OTHER)
Admission: RE | Admit: 2014-02-06 | Discharge: 2014-02-06 | Disposition: A | Discharge status: Home / Self Care | Payer: Medicare Other | Source: Ambulatory Visit | Attending: Cardiology | Admitting: Cardiology

## 2014-02-06 DIAGNOSIS — I255 Ischemic cardiomyopathy: Secondary | ICD-10-CM

## 2014-02-06 DIAGNOSIS — I70219 Atherosclerosis of native arteries of extremities with intermittent claudication, unspecified extremity: Secondary | ICD-10-CM

## 2014-02-06 DIAGNOSIS — R079 Chest pain, unspecified: Secondary | ICD-10-CM | POA: Diagnosis not present

## 2014-02-06 DIAGNOSIS — R5383 Other fatigue: Secondary | ICD-10-CM | POA: Diagnosis not present

## 2014-02-06 DIAGNOSIS — I10 Essential (primary) hypertension: Secondary | ICD-10-CM | POA: Diagnosis not present

## 2014-02-06 DIAGNOSIS — Z9861 Coronary angioplasty status: Secondary | ICD-10-CM | POA: Diagnosis not present

## 2014-02-06 DIAGNOSIS — Z95810 Presence of automatic (implantable) cardiac defibrillator: Secondary | ICD-10-CM | POA: Diagnosis not present

## 2014-02-06 DIAGNOSIS — E785 Hyperlipidemia, unspecified: Secondary | ICD-10-CM

## 2014-02-06 DIAGNOSIS — R031 Nonspecific low blood-pressure reading: Secondary | ICD-10-CM | POA: Insufficient documentation

## 2014-02-06 DIAGNOSIS — I2510 Atherosclerotic heart disease of native coronary artery without angina pectoris: Secondary | ICD-10-CM

## 2014-02-06 DIAGNOSIS — I739 Peripheral vascular disease, unspecified: Secondary | ICD-10-CM

## 2014-02-06 DIAGNOSIS — I2589 Other forms of chronic ischemic heart disease: Secondary | ICD-10-CM | POA: Diagnosis not present

## 2014-02-06 DIAGNOSIS — R42 Dizziness and giddiness: Secondary | ICD-10-CM | POA: Insufficient documentation

## 2014-02-06 DIAGNOSIS — R5381 Other malaise: Secondary | ICD-10-CM | POA: Diagnosis not present

## 2014-02-06 DIAGNOSIS — Z951 Presence of aortocoronary bypass graft: Secondary | ICD-10-CM | POA: Diagnosis not present

## 2014-02-06 DIAGNOSIS — J449 Chronic obstructive pulmonary disease, unspecified: Secondary | ICD-10-CM | POA: Insufficient documentation

## 2014-02-06 DIAGNOSIS — E782 Mixed hyperlipidemia: Secondary | ICD-10-CM

## 2014-02-06 DIAGNOSIS — J4489 Other specified chronic obstructive pulmonary disease: Secondary | ICD-10-CM | POA: Insufficient documentation

## 2014-02-06 LAB — CBC
HEMATOCRIT: 36.6 % — AB (ref 39.0–52.0)
HEMOGLOBIN: 12.4 g/dL — AB (ref 13.0–17.0)
MCH: 30.8 pg (ref 26.0–34.0)
MCHC: 33.9 g/dL (ref 30.0–36.0)
MCV: 91 fL (ref 78.0–100.0)
Platelets: 157 10*3/uL (ref 150–400)
RBC: 4.02 MIL/uL — ABNORMAL LOW (ref 4.22–5.81)
RDW: 14.3 % (ref 11.5–15.5)
WBC: 5.8 10*3/uL (ref 4.0–10.5)

## 2014-02-06 MED ORDER — REGADENOSON 0.4 MG/5ML IV SOLN
0.4000 mg | Freq: Once | INTRAVENOUS | Status: AC
  Administered 2014-02-06: 0.4 mg via INTRAVENOUS

## 2014-02-06 MED ORDER — TECHNETIUM TC 99M SESTAMIBI GENERIC - CARDIOLITE
30.0000 | Freq: Once | INTRAVENOUS | Status: AC | PRN
  Administered 2014-02-06: 30 via INTRAVENOUS

## 2014-02-06 MED ORDER — AMINOPHYLLINE 25 MG/ML IV SOLN
75.0000 mg | Freq: Once | INTRAVENOUS | Status: AC
  Administered 2014-02-06: 75 mg via INTRAVENOUS

## 2014-02-06 MED ORDER — TECHNETIUM TC 99M SESTAMIBI GENERIC - CARDIOLITE
10.0000 | Freq: Once | INTRAVENOUS | Status: AC | PRN
  Administered 2014-02-06: 10 via INTRAVENOUS

## 2014-02-06 NOTE — Procedures (Addendum)
[facility] [address] [place] [address] [phone]  Cardiology Nuclear Med Study  Louis Reid is a 80 y.o. male     MRN : [id number]     DOB: 27-Jul-1932  Procedure Date: 02/06/2014  Nuclear Med Background Indication for Stress Test:  Graft Patency and Stent Patency History:  COPD and CAD;MI-anterior STEMI-05/2012-V-FIB;CABG x5-1996;PTCA-04/2012;AICD-09/2012;Ischemic cardiomyopathy;Hypoxic Encephalopathy;Last NUC MPI on 05/18/2005-EF=69%;ECHO in 2014-EF=25% Cardiac Risk Factors: Family History - CAD, History of Smoking, Hypertension, Lipids and PE in 04/2012;  Symptoms:  Chest Pain, Dizziness and Fatigue   Nuclear Pre-Procedure Caffeine/Decaff Intake:  9:00pm NPO After: 7:00am   IV Site: R Forearm  IV 0.9% NS with Angio Cath:  22g  Chest Size (in):  36"  IV Started by: Rolene Course, RN  Height: 5\' 11"  (1.803 m)  Cup Size: n/a  BMI:  Body mass index is 18.42 kg/(m^2). Weight:  132 lb (59.875 kg)   Tech Comments:  n/a    Nuclear Med Study 1 or 2 day study: 1 day  Stress Test Type:  [place] Provider:  Kirk Ruths, MD   Resting Radionuclide: Technetium 76m Sestamibi  Resting Radionuclide Dose: 10.6 mCi   Stress Radionuclide:  Technetium 74m Sestamibi  Stress Radionuclide Dose: 30.5 mCi           Stress Protocol Rest HR: 65 Stress HR: 86  Rest BP:125/84 Stress BP: 130/93  Exercise Time (min): n/a METS: n/a          Dose of Adenosine (mg):  n/a Dose of Lexiscan: 0.4 mg  Dose of Atropine (mg): n/a Dose of Dobutamine: n/a mcg/kg/min (at max HR)  Stress Test Technologist: Mellody Memos, CCT Nuclear Technologist: Otho Perl, CNMT   Rest Procedure:  Myocardial perfusion imaging was performed at rest 45 minutes following the intravenous administration of Technetium 52m Sestamibi. Stress Procedure:  The patient received IV Lexiscan 0.4 mg over 15-seconds.  Technetium 89m Sestamibi  injected IV at 30-seconds.  Patient experienced shortness of breath, dizziness and was administered 75 mg of Aminophylline IV at 5 minutes. There were no significant changes with Lexiscan.  Quantitative spect images were obtained after a 45 minute delay.  Transient Ischemic Dilatation (Normal <1.22):  0.92 QGS EDV:  302 ml QGS ESV:  215 ml LV Ejection Fraction: 29%        Rest ECG: AV sequentially paced rhythm  Stress ECG: No significant change from baseline ECG  QPS Raw Data Images:  Normal; no motion artifact; normal heart/lung ratio. Stress Images:  There is decreased uptake in the anterior wall. Rest Images:  There is decreased uptake in the anterior wall. Subtraction (SDS):  There is a fixed defect that is most consistent with a previous infarction.  Impression Exercise Capacity:  Lexiscan with no exercise. BP Response:  Hypotensive blood pressure response. Clinical Symptoms:  No symptoms. ECG Impression:  No significant ST segment change suggestive of ischemia. Comparison with Prior Nuclear Study: No significant change from previous study  Overall Impression:  Low risk stress nuclear study LV dilatation with extensive scar in the LAD territory c/w ISCM.  LV Wall Motion:  Severe LV dysfunction with apical dyskinesis and severe anterior hypokinesis   Lorretta Harp, MD  02/06/2014 2:09 PM

## 2014-02-06 NOTE — Progress Notes (Signed)
Arterial Lower Ext. Duplex Completed. Hertha Gergen, BS, RDMS, RVT  

## 2014-02-09 ENCOUNTER — Telehealth: Payer: Self-pay | Admitting: Cardiology

## 2014-02-09 ENCOUNTER — Encounter: Payer: Self-pay | Admitting: *Deleted

## 2014-02-09 ENCOUNTER — Encounter: Payer: Self-pay | Admitting: Cardiology

## 2014-02-09 MED ORDER — ATORVASTATIN CALCIUM 40 MG PO TABS: 40.0000 mg | ORAL_TABLET | Freq: Every day | ORAL | Status: DC

## 2014-02-09 MED ORDER — LISINOPRIL 2.5 MG PO TABS: 2.5000 mg | ORAL_TABLET | Freq: Every day | ORAL | Status: DC

## 2014-02-09 NOTE — Telephone Encounter (Signed)
Pt called in stating that he has been trying to get his prescriptions for Lisinopril and Atorvastatin filled for a couple of days now but was told that the pharmacy had not received anything. [facility] [address].He stated that he is heading in to town now to get the medications Please call  Thanks

## 2014-02-09 NOTE — Telephone Encounter (Signed)
Pt wants to know if you called him about his test results?

## 2014-02-09 NOTE — Telephone Encounter (Signed)
This encounter was created in error - please disregard.

## 2014-02-09 NOTE — Telephone Encounter (Signed)
Rx refills were forwarded to [address] office due to patient being a [facility] patient. I went ahead and sent in refills to patient pharmacy as patient is on the way to pharmacy at this time.

## 2014-03-05 ENCOUNTER — Telehealth: Payer: Self-pay | Admitting: Internal Medicine

## 2014-03-05 ENCOUNTER — Telehealth: Payer: Self-pay | Admitting: Cardiology

## 2014-03-05 DIAGNOSIS — E785 Hyperlipidemia, unspecified: Secondary | ICD-10-CM

## 2014-03-05 MED ORDER — PRAVASTATIN SODIUM 40 MG PO TABS: 40.0000 mg | ORAL_TABLET | Freq: Every evening | ORAL | Status: DC

## 2014-03-05 NOTE — Telephone Encounter (Signed)
The patient called today to discuss remote transmissions. He states that he has had multiple issues with his transmitter. Last transmission received 09/20/13 (saw Dr. Rayann Heman the same day). I was to start ICM calls with the patient after that. He states that he and his son have called [employer] tech support several times and they feel they have received minimal help when they have spoken with someone directly. At other times they have trouble reaching someone to speak with. The patient states that the lights on his transmitter started flashing recently to the point he couldn't sleep, so he unplugged the transmitter. The patient does have [place] phone service. He states he has received no extra equipment for this. He is getting upset about the transmitter and the problems he has encountered. I advised the patient that he is due for a device check. He will come in for an in office check on 03/21/14 and have further discussion regarding his transmitter. If his issues with remote can be resolved, I will start to follow him in the [facility] clinic. He has been very kind and appreciate of the assistance today.

## 2014-03-05 NOTE — Telephone Encounter (Signed)
Spoke with pt, Aware of dr [facility]'s recommendations.  °

## 2014-03-05 NOTE — Telephone Encounter (Signed)
Spoke with pt, he has been off the lipitor for about 2 weeks now. His nausea, fatigue and leg weakness has improved, he wonders what to do next. Will forward for dr Stanford Breed review

## 2014-03-05 NOTE — Telephone Encounter (Signed)
DC lipitor; pravachol 40 mg daily; lipids and liver in 4 weeks Kirk Ruths

## 2014-03-05 NOTE — Telephone Encounter (Signed)
°  Patient would like to speak with you. Please call and advise.

## 2014-03-31 LAB — HEPATIC FUNCTION PANEL
ALK PHOS: 57 U/L (ref 39–117)
ALT: 10 U/L (ref 0–53)
AST: 16 U/L (ref 0–37)
Albumin: 3.7 g/dL (ref 3.5–5.2)
Bilirubin, Direct: 0.2 mg/dL (ref 0.0–0.3)
Indirect Bilirubin: 0.6 mg/dL (ref 0.2–1.2)
Total Bilirubin: 0.8 mg/dL (ref 0.2–1.2)
Total Protein: 6 g/dL (ref 6.0–8.3)

## 2014-03-31 LAB — LIPID PANEL
CHOL/HDL RATIO: 2.8 ratio
CHOLESTEROL: 113 mg/dL (ref 0–200)
HDL: 41 mg/dL (ref >39)
LDL Cholesterol: 57 mg/dL (ref 0–99)
Triglycerides: 76 mg/dL (ref <150)
VLDL: 15 mg/dL (ref 0–40)

## 2014-04-03 ENCOUNTER — Encounter: Payer: Self-pay | Admitting: *Deleted

## 2014-04-04 ENCOUNTER — Encounter: Payer: Self-pay | Admitting: Internal Medicine

## 2014-04-04 ENCOUNTER — Ambulatory Visit (INDEPENDENT_AMBULATORY_CARE_PROVIDER_SITE_OTHER): Payer: Medicare Other | Admitting: *Deleted

## 2014-04-04 DIAGNOSIS — I5021 Acute systolic (congestive) heart failure: Secondary | ICD-10-CM

## 2014-04-04 DIAGNOSIS — I255 Ischemic cardiomyopathy: Secondary | ICD-10-CM

## 2014-04-04 DIAGNOSIS — Z95810 Presence of automatic (implantable) cardiac defibrillator: Secondary | ICD-10-CM

## 2014-04-04 LAB — MDC_IDC_ENUM_SESS_TYPE_INCLINIC
Battery Remaining Longevity: 84 mo
Brady Statistic RA Percent Paced: 28 %
Brady Statistic RV Percent Paced: 0.65 %
Date Time Interrogation Session: 20151028141019
HIGH POWER IMPEDANCE MEASURED VALUE: 60.75 Ohm
Implantable Pulse Generator Serial Number: 7094248
Lead Channel Impedance Value: 362.5 Ohm
Lead Channel Pacing Threshold Amplitude: 0.75 V
Lead Channel Pacing Threshold Amplitude: 0.75 V
Lead Channel Pacing Threshold Pulse Width: 0.5 ms
Lead Channel Sensing Intrinsic Amplitude: 11 mV
Lead Channel Setting Pacing Amplitude: 2 V
MDC IDC MSMT LEADCHNL RA IMPEDANCE VALUE: 425 Ohm
MDC IDC MSMT LEADCHNL RA PACING THRESHOLD AMPLITUDE: 0.75 V
MDC IDC MSMT LEADCHNL RA PACING THRESHOLD PULSEWIDTH: 0.5 ms
MDC IDC MSMT LEADCHNL RA PACING THRESHOLD PULSEWIDTH: 0.5 ms
MDC IDC MSMT LEADCHNL RA SENSING INTR AMPL: 5 mV
MDC IDC MSMT LEADCHNL RV PACING THRESHOLD AMPLITUDE: 0.75 V
MDC IDC MSMT LEADCHNL RV PACING THRESHOLD PULSEWIDTH: 0.5 ms
MDC IDC PG MODEL: 2411
MDC IDC SET LEADCHNL RV PACING AMPLITUDE: 2.5 V
MDC IDC SET LEADCHNL RV PACING PULSEWIDTH: 0.5 ms
MDC IDC SET LEADCHNL RV SENSING SENSITIVITY: 0.5 mV
Zone Setting Detection Interval: 270 ms
Zone Setting Detection Interval: 330 ms

## 2014-04-04 NOTE — Progress Notes (Signed)
ICD check in clinic. Normal device function. Thresholds and sensing consistent with previous device measurements. Impedance trends stable over time. 1 NSVT---13 beats. 24 mode switches---longest 8 sec; some EGMs show retrograde. Histogram distribution appropriate for patient and level of activity. CorVue abnormal 02/25/14 x7d. No changes made this session. Device programmed at appropriate safety margins. Device programmed to optimize intrinsic conduction. Estimated longevity 5.8-7.46yrs. Alert vibration demonstrated for patient, pt knows to call clinic if felt. Merlin (tentative) 07/05/14 & ROV w/ Greggory Brandy 4/16.

## 2014-04-06 ENCOUNTER — Encounter: Payer: Self-pay | Admitting: Cardiology

## 2014-04-06 NOTE — Telephone Encounter (Signed)
This encounter was created in error - please disregard.

## 2014-04-06 NOTE — Telephone Encounter (Signed)
Pt wants his lab results from last week please.

## 2014-05-10 ENCOUNTER — Telehealth: Payer: Self-pay | Admitting: Cardiology

## 2014-05-10 MED ORDER — SPIRONOLACTONE 25 MG PO TABS: 25.0000 mg | ORAL_TABLET | Freq: Every day | ORAL | Status: DC

## 2014-05-10 MED ORDER — CARVEDILOL 6.25 MG PO TABS: 6.2500 mg | ORAL_TABLET | Freq: Two times a day (BID) | ORAL | Status: DC

## 2014-05-10 NOTE — Telephone Encounter (Signed)
Spoke with pt, refills sent to the pharm

## 2014-05-10 NOTE — Telephone Encounter (Signed)
Pt called in stating that a prior authorization is needed to refill his Spironolactone and Carvedilol(he has been out since Monday). He was told by the pharmacy that [facility]  But has not received a response. Please call   Thanks

## 2014-05-17 ENCOUNTER — Encounter (HOSPITAL_COMMUNITY): Payer: Self-pay | Admitting: Cardiovascular Disease

## 2014-06-25 ENCOUNTER — Telehealth: Payer: Self-pay | Admitting: Cardiology

## 2014-06-25 MED ORDER — FUROSEMIDE 40 MG PO TABS: 40.0000 mg | ORAL_TABLET | Freq: Every day | ORAL | Status: DC

## 2014-06-25 NOTE — Telephone Encounter (Signed)
Rx(s) sent to pharmacy electronically.  

## 2014-06-25 NOTE — Telephone Encounter (Signed)
Pt called in stating that he is out of his Furosemide and would like [facility] [address].  Thanks

## 2014-07-05 ENCOUNTER — Telehealth: Payer: Self-pay | Admitting: Cardiology

## 2014-07-05 ENCOUNTER — Encounter: Payer: Medicare Other | Admitting: *Deleted

## 2014-07-05 NOTE — Telephone Encounter (Signed)
Pt does not want to use home monitor. He request to come into office every 3 months to get ICD interrogated. Pt agreed to appt on 2-8 at 10:00.

## 2014-07-06 ENCOUNTER — Encounter: Payer: Self-pay | Admitting: Cardiology

## 2014-07-15 NOTE — Progress Notes (Signed)
HPI: FU coronary artery disease and ischemic cardiomyopathy. Patient is status post coronary artery bypass graft in 1996. Patient had an anterior infarct in December of 5784 complicated by ventricular fibrillation arrest. Cardiac catheterization revealed sequential saphenous vein graft to the OM1 and OM2 patent, saphenous vein graft to diagonal patent, saphenous vein graft to the right coronary occluded and LIMA to the LAD atretic. The proximal LAD was occluded. Ejection fraction 20%. The patient had PCI of his LAD. Course complicated by hypoxic encephalopathy. Echocardiogram repeated in March of 2014. Ejection fraction 25%. He is status post ICD 4/14. Nuclear study 9/15 showed EF 29, scar, no ischemia. ABIs 9/15 normal. Since last seen, the patient denies any dyspnea on exertion, orthopnea, PND, pedal edema, palpitations, syncope or chest pain.   Current Outpatient Prescriptions  Medication Sig Dispense Refill  . aspirin EC 81 MG tablet Take 81 mg by mouth daily.    . carvedilol (COREG) 6.25 MG tablet Take 1 tablet (6.25 mg total) by mouth 2 (two) times daily with a meal. 60 tablet 12  . cholecalciferol (VITAMIN D) 1000 UNITS tablet Take 1,000 Units by mouth daily.    Marland Kitchen docusate sodium (COLACE) 100 MG capsule Take 100 mg by mouth 2 (two) times daily as needed for constipation.     . furosemide (LASIX) 40 MG tablet Take 1 tablet (40 mg total) by mouth daily. Take extra 1/2 tablet as needed for 2lb weight gain, swelling, shortness of breath. 45 tablet 7  . lisinopril (PRINIVIL,ZESTRIL) 2.5 MG tablet Take 1 tablet (2.5 mg total) by mouth daily. 30 tablet 5  . Multiple Vitamins-Minerals (PRESERVISION AREDS 2 PO) Take by mouth 2 (two) times daily.    . pravastatin (PRAVACHOL) 40 MG tablet Take 1 tablet (40 mg total) by mouth every evening. 30 tablet 11  . spironolactone (ALDACTONE) 25 MG tablet Take 1 tablet (25 mg total) by mouth daily. 30 tablet 12   No current facility-administered  medications for this visit.     Past Medical History  Diagnosis Date  . CAD (coronary artery disease)     a. s/p remote CABG, b. AL STEMI c/b VF arrest in 05/2012 => LHC 05/10/12: Severe 3v CAD, S-OM1/2 ok, SVG-Dx ok, SVG-RCA occluded, LIMA-LAD atretic, proximal LAD occluded. EF 20% with anterior apical HK=> salvage PCI with POBA and thrombectomy of the occluded LAD;  c/b hypoxic encephalopathy  . HTN (hypertension)   . Depression   . Congenital funnel chest   . Osteoporosis   . SBO (small bowel obstruction) November 2013    s/p lysis of adhesions  . Pectus excavatum   . Anemia   . Clotting disorder   . Chronic systolic heart failure     a. Echo 12/13: EF 20-25%, anteroseptal, inferior, apical HK, mildly reduced RV function, trivial pericardial effusion;   b.  Echo 3/14:  Ant, septal, apical AK, EF 25%  . Ischemic cardiomyopathy   . Pulmonary embolism     a. after adhesiolysis for SBO in 04/2012 => coumadin for 6 mos  . Kidney stones   . ICD (implantable cardiac defibrillator) in place   . STEMI (ST elevation myocardial infarction) 05/2012  . Sinoatrial node dysfunction   . Cardiac arrest 05/2012  . Ventricular fibrillation 05/2012    . in setting of AL STEMI 12/13 => EF 20-25% => d/c on LifeVest (repeat echo planned in 08/2012)  . Pneumonia 05/2012  . Inguinal hernia     "have one now on  my left side" (09/13/2012)  . Cataract     Past Surgical History  Procedure Laterality Date  . Transurethral resection of prostate    . Ptca      percutaneous transluminal coronary intervention and brachy therapy, Bruce R. Olevia Perches, MD. EF 60%  . Laparotomy  04/27/2012    Procedure: EXPLORATORY LAPAROTOMY;  Surgeon: Gwenyth Ober, MD;  Location: [place];  Service: General;  Laterality: N/A;  . Esophagogastroduodenoscopy  05/08/2012    Procedure: ESOPHAGOGASTRODUODENOSCOPY (EGD);  Surgeon: Juanita Craver, MD;  Location: [facility] ENDOSCOPY;  Service: Endoscopy;  Laterality: N/A;  Nevin Bloodgood put on for Dr. Collene Mares ,  Dr. Collene Mares will call if she wants another time  . Upper gastrointestinal endoscopy    . Colonoscopy    . Cardiac catheterization    . Cardiac defibrillator placement  09/13/2012    SJM Ellipse ER implanted by Dr Rayann Heman for secondary prevention  . Coronary artery bypass graft  1996    "CABG X5" (09/13/2012)  . Inguinal hernia repair Right 1996  . Cystoscopy/retrograde/ureteroscopy/stone extraction with basket  1990's  . Left heart catheterization with coronary angiogram N/A 05/10/2012    Procedure: LEFT HEART CATHETERIZATION WITH CORONARY ANGIOGRAM;  Surgeon: Burnell Blanks, MD;  Location: [facility] CATH LAB;  Service: Cardiovascular;  Laterality: N/A;  . Implantable cardioverter defibrillator implant N/A 09/13/2012    Procedure: IMPLANTABLE CARDIOVERTER DEFIBRILLATOR IMPLANT;  Surgeon: Thompson Grayer, MD;  Location: [facility] CATH LAB;  Service: Cardiovascular;  Laterality: N/A;    History   Social History  . Marital Status: Widowed    Spouse Name: N/A    Number of Children: 1  . Years of Education: N/A   Occupational History  . [name]    Social History Main Topics  . Smoking status: Former Smoker    Types: Cigarettes  . Smokeless tobacco: Never Used     Comment: 09/14/2011 "quit smoking when I was ~ 17; probably didn't smoke 10 packs in my life"  . Alcohol Use: No  . Drug Use: No  . Sexual Activity: Not Currently   Other Topics Concern  . Not on file   Social History Narrative    ROS: no fevers or chills, productive cough, hemoptysis, dysphasia, odynophagia, melena, hematochezia, dysuria, hematuria, rash, seizure activity, orthopnea, PND, pedal edema, claudication. Remaining systems are negative.  Physical Exam: Well-developed well-nourished in no acute distress.  Skin is warm and dry.  HEENT is normal.  Neck is supple.  Chest is clear to auscultation with normal expansion. Pectus excavatum. Cardiovascular exam is regular rate and rhythm.  Abdominal exam nontender or  distended. No masses palpated. Extremities show no edema. neuro grossly intact  ECG atrial paced rhythm, normal axis, anterior lateral T-wave inversion, low voltage

## 2014-07-16 ENCOUNTER — Encounter: Payer: Self-pay | Admitting: Internal Medicine

## 2014-07-16 ENCOUNTER — Ambulatory Visit (INDEPENDENT_AMBULATORY_CARE_PROVIDER_SITE_OTHER): Payer: Medicare Other | Admitting: *Deleted

## 2014-07-16 ENCOUNTER — Ambulatory Visit (INDEPENDENT_AMBULATORY_CARE_PROVIDER_SITE_OTHER): Payer: Medicare Other | Admitting: Cardiology

## 2014-07-16 ENCOUNTER — Encounter: Payer: Self-pay | Admitting: Cardiology

## 2014-07-16 ENCOUNTER — Other Ambulatory Visit (INDEPENDENT_AMBULATORY_CARE_PROVIDER_SITE_OTHER): Payer: Medicare Other | Admitting: *Deleted

## 2014-07-16 VITALS — BP 98/64 | HR 68 | Ht 71.0 in | Wt 134.1 lb

## 2014-07-16 DIAGNOSIS — Z95810 Presence of automatic (implantable) cardiac defibrillator: Secondary | ICD-10-CM

## 2014-07-16 DIAGNOSIS — I469 Cardiac arrest, cause unspecified: Secondary | ICD-10-CM

## 2014-07-16 DIAGNOSIS — I255 Ischemic cardiomyopathy: Secondary | ICD-10-CM

## 2014-07-16 DIAGNOSIS — I2510 Atherosclerotic heart disease of native coronary artery without angina pectoris: Secondary | ICD-10-CM

## 2014-07-16 DIAGNOSIS — I10 Essential (primary) hypertension: Secondary | ICD-10-CM

## 2014-07-16 LAB — MDC_IDC_ENUM_SESS_TYPE_INCLINIC
Battery Remaining Longevity: 82.8 mo
Brady Statistic RA Percent Paced: 23 %
HighPow Impedance: 57.375
Lead Channel Impedance Value: 412.5 Ohm
Lead Channel Impedance Value: 425 Ohm
Lead Channel Pacing Threshold Amplitude: 0.75 V
Lead Channel Pacing Threshold Pulse Width: 0.5 ms
Lead Channel Pacing Threshold Pulse Width: 0.5 ms
Lead Channel Sensing Intrinsic Amplitude: 11.7 mV
Lead Channel Sensing Intrinsic Amplitude: 5 mV
Lead Channel Setting Pacing Amplitude: 2.5 V
Lead Channel Setting Sensing Sensitivity: 0.5 mV
MDC IDC MSMT LEADCHNL RA PACING THRESHOLD AMPLITUDE: 0.75 V
MDC IDC PG MODEL: 2411
MDC IDC PG SERIAL: 7094248
MDC IDC SESS DTM: 20160208103011
MDC IDC SET LEADCHNL RA PACING AMPLITUDE: 2 V
MDC IDC SET LEADCHNL RV PACING PULSEWIDTH: 0.5 ms
MDC IDC STAT BRADY RV PERCENT PACED: 0.21 %
Zone Setting Detection Interval: 270 ms
Zone Setting Detection Interval: 330 ms

## 2014-07-16 LAB — BASIC METABOLIC PANEL
BUN: 33 mg/dL — ABNORMAL HIGH (ref 6–23)
CALCIUM: 9.2 mg/dL (ref 8.4–10.5)
CHLORIDE: 104 meq/L (ref 96–112)
CO2: 29 mEq/L (ref 19–32)
CREATININE: 1.44 mg/dL (ref 0.40–1.50)
GFR: 50.24 mL/min — ABNORMAL LOW (ref >60.00)
Glucose, Bld: 90 mg/dL (ref 70–99)
Potassium: 4.4 mEq/L (ref 3.5–5.1)
SODIUM: 139 meq/L (ref 135–145)

## 2014-07-16 NOTE — Assessment & Plan Note (Signed)
Continue aspirin and statin. 

## 2014-07-16 NOTE — Patient Instructions (Signed)
Your physician wants you to follow-up in: Louis Reid will receive a reminder letter in the mail two months in advance. If you don't receive a letter, please call our office to schedule the follow-up appointment.   Your physician recommends that you HAVE LAB WORK AT [facility]

## 2014-07-16 NOTE — Assessment & Plan Note (Signed)
Blood pressure controlled. Continue present medications. Check potassium and renal function. 

## 2014-07-16 NOTE — Progress Notes (Signed)
Pt seen in clinic for follow up of ICD.  No complaints of chest pain, shortness of breath, dizziness, palpitations, or shocks.  Device functioning normally at this time.  For full details, see [employer] report.  No programming changes made today.  Plan to follow up with JA 09/24/14.  Ranee Gosselin, RN, BSN 07/16/2014 10:06 AM

## 2014-07-16 NOTE — Assessment & Plan Note (Signed)
Continue statin. 

## 2014-07-16 NOTE — Assessment & Plan Note (Signed)
Continue ACE inhibitor and beta blocker. I cannot advance as blood pressure is borderline.

## 2014-07-16 NOTE — Assessment & Plan Note (Signed)
Followed by electrophysiology. 

## 2014-07-16 NOTE — Addendum Note (Signed)
Addended by: Eulis Foster on: 07/16/2014 09:54 AM   Modules accepted: Orders

## 2014-07-17 ENCOUNTER — Other Ambulatory Visit: Payer: Self-pay | Admitting: *Deleted

## 2014-07-17 DIAGNOSIS — N182 Chronic kidney disease, stage 2 (mild): Secondary | ICD-10-CM

## 2014-07-17 MED ORDER — FUROSEMIDE 40 MG PO TABS: 20.0000 mg | ORAL_TABLET | Freq: Every day | ORAL | Status: DC

## 2014-07-26 LAB — BASIC METABOLIC PANEL WITH GFR
BUN: 26 mg/dL — AB (ref 6–23)
CALCIUM: 9.2 mg/dL (ref 8.4–10.5)
CO2: 29 meq/L (ref 19–32)
Chloride: 105 mEq/L (ref 96–112)
Creat: 1.19 mg/dL (ref 0.50–1.35)
GFR, EST AFRICAN AMERICAN: 67 mL/min
GFR, Est Non African American: 58 mL/min — ABNORMAL LOW
GLUCOSE: 84 mg/dL (ref 70–99)
Potassium: 4.6 mEq/L (ref 3.5–5.3)
Sodium: 140 mEq/L (ref 135–145)

## 2014-08-01 ENCOUNTER — Other Ambulatory Visit: Payer: Self-pay | Admitting: Cardiology

## 2014-08-02 ENCOUNTER — Telehealth: Payer: Self-pay | Admitting: Cardiology

## 2014-08-02 NOTE — Telephone Encounter (Signed)
Attempted to call pt and talk w/ him about ICM clinic. No answer and unable to leave a message.

## 2014-08-02 NOTE — Telephone Encounter (Signed)
Pt stated that he just received cell adapter and he still does not know if home monitor will work. If home monitor works w/ cell adapter he will enroll in [facility] clinic. But he wants to wait until he has his 1st transmission after MD appt before agreeing to [facility] clinic.

## 2014-08-21 ENCOUNTER — Telehealth: Payer: Self-pay | Admitting: Cardiology

## 2014-08-21 MED ORDER — FUROSEMIDE 20 MG PO TABS: ORAL_TABLET | ORAL | Status: DC

## 2014-08-21 NOTE — Telephone Encounter (Signed)
Louis Reid called wanting to clarify the dosage of the pt's Furosemide prescription. The pt says that he takes 20 mg and the script is written for 40mg  . Please call back  Thanks

## 2014-08-21 NOTE — Telephone Encounter (Signed)
Spoke to patient he stated he would like a 20 mg tablet of Lasix instead of 40 mg.Lasix 20 mg sent to pharmacy.

## 2014-09-21 ENCOUNTER — Other Ambulatory Visit: Payer: Self-pay

## 2014-09-24 ENCOUNTER — Encounter: Payer: Self-pay | Admitting: Internal Medicine

## 2014-09-24 ENCOUNTER — Telehealth: Payer: Self-pay | Admitting: Cardiology

## 2014-09-24 ENCOUNTER — Ambulatory Visit (INDEPENDENT_AMBULATORY_CARE_PROVIDER_SITE_OTHER): Payer: Medicare Other | Admitting: Internal Medicine

## 2014-09-24 VITALS — BP 114/66 | HR 69 | Ht 71.0 in | Wt 137.6 lb

## 2014-09-24 DIAGNOSIS — I255 Ischemic cardiomyopathy: Secondary | ICD-10-CM | POA: Diagnosis not present

## 2014-09-24 DIAGNOSIS — Z95810 Presence of automatic (implantable) cardiac defibrillator: Secondary | ICD-10-CM

## 2014-09-24 DIAGNOSIS — I469 Cardiac arrest, cause unspecified: Secondary | ICD-10-CM

## 2014-09-24 LAB — MDC_IDC_ENUM_SESS_TYPE_INCLINIC
Brady Statistic RV Percent Paced: 0.23 %
Date Time Interrogation Session: 20160418102214
HighPow Impedance: 63 Ohm
Implantable Pulse Generator Model: 2411
Implantable Pulse Generator Serial Number: 7094248
Lead Channel Impedance Value: 412.5 Ohm
Lead Channel Pacing Threshold Amplitude: 0.75 V
Lead Channel Pacing Threshold Amplitude: 0.75 V
Lead Channel Pacing Threshold Amplitude: 0.75 V
Lead Channel Pacing Threshold Pulse Width: 0.5 ms
Lead Channel Pacing Threshold Pulse Width: 0.5 ms
Lead Channel Pacing Threshold Pulse Width: 0.5 ms
Lead Channel Sensing Intrinsic Amplitude: 5 mV
MDC IDC MSMT BATTERY REMAINING LONGEVITY: 79.2 mo
MDC IDC MSMT LEADCHNL RA PACING THRESHOLD PULSEWIDTH: 0.5 ms
MDC IDC MSMT LEADCHNL RV IMPEDANCE VALUE: 362.5 Ohm
MDC IDC MSMT LEADCHNL RV PACING THRESHOLD AMPLITUDE: 0.75 V
MDC IDC MSMT LEADCHNL RV SENSING INTR AMPL: 11.6 mV
MDC IDC SET LEADCHNL RA PACING AMPLITUDE: 2 V
MDC IDC SET LEADCHNL RV PACING AMPLITUDE: 2.5 V
MDC IDC SET LEADCHNL RV PACING PULSEWIDTH: 0.5 ms
MDC IDC SET LEADCHNL RV SENSING SENSITIVITY: 0.5 mV
MDC IDC SET ZONE DETECTION INTERVAL: 270 ms
MDC IDC SET ZONE DETECTION INTERVAL: 330 ms
MDC IDC STAT BRADY RA PERCENT PACED: 27 %

## 2014-09-24 NOTE — Patient Instructions (Addendum)
Medication Instructions:  Your physician recommends that you continue on your current medications as directed. Please refer to the Current Medication list given to you today.   Labwork: none  Testing/Procedures: none  Follow-Up: Your physician wants you to follow-up in: 12 month with Dr. Rayann Heman. You will receive a reminder letter in the mail two months in advance. If you don't receive a letter, please call our office to schedule the follow-up appointment.   Remote monitoring is used to monitor your Pacemaker or ICD from home. This monitoring reduces the number of office visits required to check your device to one time per year. It allows [place] to keep an eye on the functioning of your device to ensure it is working properly. You are scheduled for a device check from home on 12/24/14. You may send your transmission at any time that day. If you have a wireless device, the transmission will be sent automatically. After your physician reviews your transmission, you will receive a postcard with your next transmission date.

## 2014-09-24 NOTE — Progress Notes (Signed)
Electrophysiology Office Note   Date:  09/24/2014   ID:  Louis Reid, DOB 04-12-1933, MRN [id number]  PCP:  [name], MD  Cardiologist:  Dr Stanford Breed Primary Electrophysiologist: Thompson Grayer, MD    Chief Complaint  Patient presents with  . Appointment    SOB     History of Present Illness: Louis Reid is a 81 y.o. male who presents today for electrophysiology evaluation.   He is doing well.  Denies cardiac concerns.  Today, he denies symptoms of palpitations, chest pain, shortness of breath, orthopnea, PND, lower extremity edema, claudication, dizziness, presyncope, syncope, bleeding, or neurologic sequela. The patient is tolerating medications without difficulties and is otherwise without complaint today.    Past Medical History  Diagnosis Date  . CAD (coronary artery disease)     a. s/p remote CABG, b. AL STEMI c/b VF arrest in 05/2012 => LHC 05/10/12: Severe 3v CAD, S-OM1/2 ok, SVG-Dx ok, SVG-RCA occluded, LIMA-LAD atretic, proximal LAD occluded. EF 20% with anterior apical HK=> salvage PCI with POBA and thrombectomy of the occluded LAD;  c/b hypoxic encephalopathy  . HTN (hypertension)   . Depression   . Congenital funnel chest   . Osteoporosis   . SBO (small bowel obstruction) November 2013    s/p lysis of adhesions  . Pectus excavatum   . Anemia   . Clotting disorder   . Chronic systolic heart failure     a. Echo 12/13: EF 20-25%, anteroseptal, inferior, apical HK, mildly reduced RV function, trivial pericardial effusion;   b.  Echo 3/14:  Ant, septal, apical AK, EF 25%  . Ischemic cardiomyopathy   . Pulmonary embolism     a. after adhesiolysis for SBO in 04/2012 => coumadin for 6 mos  . Kidney stones   . ICD (implantable cardiac defibrillator) in place   . STEMI (ST elevation myocardial infarction) 05/2012  . Sinoatrial node dysfunction   . Cardiac arrest 05/2012  . Ventricular fibrillation 05/2012    . in setting of AL STEMI 12/13 => EF 20-25%  => d/c on LifeVest (repeat echo planned in 08/2012)  . Pneumonia 05/2012  . Inguinal hernia     "have one now on my left side" (09/13/2012)  . Cataract    Past Surgical History  Procedure Laterality Date  . Transurethral resection of prostate    . Ptca      percutaneous transluminal coronary intervention and brachy therapy, Bruce R. Olevia Perches, MD. EF 60%  . Laparotomy  04/27/2012    Procedure: EXPLORATORY LAPAROTOMY;  Surgeon: Gwenyth Ober, MD;  Location: [facility];  Service: General;  Laterality: N/A;  . Esophagogastroduodenoscopy  05/08/2012    Procedure: ESOPHAGOGASTRODUODENOSCOPY (EGD);  Surgeon: Juanita Craver, MD;  Location: [facility] ENDOSCOPY;  Service: Endoscopy;  Laterality: N/A;  Nevin Bloodgood put on for Dr. Collene Mares , Dr. Collene Mares will call if she wants another time  . Upper gastrointestinal endoscopy    . Colonoscopy    . Cardiac catheterization    . Cardiac defibrillator placement  09/13/2012    SJM Ellipse ER implanted by Dr Rayann Heman for secondary prevention  . Coronary artery bypass graft  1996    "CABG X5" (09/13/2012)  . Inguinal hernia repair Right 1996  . Cystoscopy/retrograde/ureteroscopy/stone extraction with basket  1990's  . Left heart catheterization with coronary angiogram N/A 05/10/2012    Procedure: LEFT HEART CATHETERIZATION WITH CORONARY ANGIOGRAM;  Surgeon: Burnell Blanks, MD;  Location: [facility] CATH LAB;  Service: Cardiovascular;  Laterality: N/A;  .  Implantable cardioverter defibrillator implant N/A 09/13/2012    Procedure: IMPLANTABLE CARDIOVERTER DEFIBRILLATOR IMPLANT;  Surgeon: Thompson Grayer, MD;  Location: [facility] CATH LAB;  Service: Cardiovascular;  Laterality: N/A;     Current Outpatient Prescriptions  Medication Sig Dispense Refill  . aspirin EC 81 MG tablet Take 81 mg by mouth daily.    . carvedilol (COREG) 6.25 MG tablet Take 1 tablet (6.25 mg total) by mouth 2 (two) times daily with a meal. 60 tablet 12  . cholecalciferol (VITAMIN D) 1000 UNITS tablet Take 1,000 Units by mouth daily.      Marland Kitchen docusate sodium (COLACE) 100 MG capsule Take 100 mg by mouth 2 (two) times daily as needed for constipation.     . furosemide (LASIX) 40 MG tablet Take 40 mg by mouth daily.    Marland Kitchen lisinopril (PRINIVIL,ZESTRIL) 2.5 MG tablet TAKE 1 TABLET (2.5 MG TOTAL) BY MOUTH DAILY. 30 tablet 5  . Multiple Vitamins-Minerals (PRESERVISION AREDS 2 PO) Take 1 tablet by mouth 2 (two) times daily.     . pravastatin (PRAVACHOL) 40 MG tablet Take 1 tablet (40 mg total) by mouth every evening. 30 tablet 11  . spironolactone (ALDACTONE) 25 MG tablet Take 1 tablet (25 mg total) by mouth daily. 30 tablet 12   No current facility-administered medications for this visit.    Allergies:   Cold medicine plus   Social History:  The patient  reports that he has quit smoking. His smoking use included Cigarettes. He has never used smokeless tobacco. He reports that he does not drink alcohol or use illicit drugs.   Family History:  The patient's family history includes Cancer in his brother; Colon cancer in his brother; Heart disease in his brother and father; Other in his mother.    ROS:  Please see the history of present illness.   All other systems are reviewed and negative.    PHYSICAL EXAM: VS:  BP 114/66 mmHg  Pulse 69  Ht 5\' 11"  (1.803 m)  Wt 137 lb 9.6 oz (62.415 kg)  BMI 19.20 kg/m2 , BMI Body mass index is 19.2 kg/(m^2). GEN: Well nourished, well developed, in no acute distress HEENT: normal Neck: no JVD, carotid bruits, or masses Cardiac: RRR; no murmurs, rubs, or gallops,no edema  Respiratory:  clear to auscultation bilaterally, normal work of breathing GI: soft, nontender, nondistended, + BS MS: no deformity or atrophy Skin: warm and dry, device pocket is well healed Neuro:  Strength and sensation are intact Psych: euthymic mood, full affect  Device interrogation is reviewed today in detail.  See PaceArt for details.   Recent Labs: 02/06/2014: Hemoglobin 12.4*; Platelets 157 03/30/2014: ALT  10 07/25/2014: BUN 26*; Creatinine 1.19; Potassium 4.6; Sodium 140    Lipid Panel     Component Value Date/Time   CHOL 113 03/30/2014 1020   TRIG 76 03/30/2014 1020   HDL 41 03/30/2014 1020   CHOLHDL 2.8 03/30/2014 1020   VLDL 15 03/30/2014 1020   LDLCALC 57 03/30/2014 1020     Wt Readings from Last 3 Encounters:  09/24/14 137 lb 9.6 oz (62.415 kg)  07/16/14 134 lb 1.6 oz (60.827 kg)  02/06/14 132 lb (59.875 kg)      Other studies Reviewed: Additional studies/ records that were reviewed today include: Dr Lonia Skinner notes    ASSESSMENT AND PLAN:  1.  Ischemic CM with prior VF arrest Normal ICD function See Pace Art report No changes today  2. CAD No ischemic symptoms  Remote monitoring Return  to see me in 1 year  Current medicines are reviewed at length with the patient today.   The patient does not have concerns regarding his medicines.  The following changes were made today:  none  Labs/ tests ordered today include:  Orders Placed This Encounter  Procedures  . Implantable device check    Signed, Thompson Grayer, MD  09/24/2014 10:34 AM     [facility] [address] [place] [address] [phone] (office) [phone] (fax)

## 2014-09-24 NOTE — Telephone Encounter (Signed)
Pt called and stated that he done what device tech told him to do today at appt w/ MD with his home monitor. We still have not received transmission. Pt stated that he has had a lot of issues w/ home monitor and he has tried everything everyone has told him to get home monitor to work. Pt stated he will not be using home monitor anymore. Pt agreed to appt w/ device clinic on 7-25 at 9:00 AM.

## 2014-12-31 ENCOUNTER — Ambulatory Visit (INDEPENDENT_AMBULATORY_CARE_PROVIDER_SITE_OTHER): Payer: Medicare Other | Admitting: *Deleted

## 2014-12-31 ENCOUNTER — Encounter: Payer: Self-pay | Admitting: Internal Medicine

## 2014-12-31 DIAGNOSIS — I4901 Ventricular fibrillation: Secondary | ICD-10-CM

## 2014-12-31 DIAGNOSIS — I469 Cardiac arrest, cause unspecified: Secondary | ICD-10-CM | POA: Diagnosis not present

## 2014-12-31 DIAGNOSIS — I255 Ischemic cardiomyopathy: Secondary | ICD-10-CM | POA: Diagnosis not present

## 2014-12-31 DIAGNOSIS — I5021 Acute systolic (congestive) heart failure: Secondary | ICD-10-CM

## 2015-01-01 LAB — CUP PACEART INCLINIC DEVICE CHECK
Battery Remaining Percentage: 77 %
Brady Statistic RA Percent Paced: 32 %
Brady Statistic RV Percent Paced: 1 % — CL
Date Time Interrogation Session: 20160726095844
HighPow Impedance: 63 Ohm
Lead Channel Impedance Value: 390 Ohm
Lead Channel Sensing Intrinsic Amplitude: 9.8 mV
Lead Channel Setting Pacing Amplitude: 2.5 V
Lead Channel Setting Pacing Pulse Width: 0.5 ms
Lead Channel Setting Sensing Sensitivity: 0.5 mV
MDC IDC MSMT LEADCHNL RA IMPEDANCE VALUE: 410 Ohm
MDC IDC MSMT LEADCHNL RA PACING THRESHOLD AMPLITUDE: 0.75 V
MDC IDC MSMT LEADCHNL RA PACING THRESHOLD PULSEWIDTH: 0.5 ms
MDC IDC MSMT LEADCHNL RA SENSING INTR AMPL: 5 mV — AB
MDC IDC MSMT LEADCHNL RV PACING THRESHOLD AMPLITUDE: 0.75 V
MDC IDC MSMT LEADCHNL RV PACING THRESHOLD PULSEWIDTH: 0.5 ms
MDC IDC PG MODEL: 2411
MDC IDC SET LEADCHNL RA PACING AMPLITUDE: 2 V
MDC IDC SET ZONE DETECTION INTERVAL: 270 ms
Pulse Gen Serial Number: 7094248
Zone Setting Detection Interval: 330 ms

## 2015-01-01 NOTE — Progress Notes (Signed)
ICD check in clinic. Normal device function. Thresholds and sensing consistent with previous device measurements. Impedance trends stable over time. No evidence of any ventricular arrhythmias. (6) mode switches---max dur. 18 sec, Max A 219, Max V 129, last 7/18---AT/AFL + ASA. Histogram distribution appropriate for patient and level of activity. No changes made this session. Device programmed at appropriate safety margins. Device programmed to optimize intrinsic conduction. Estimated longevity 5.3-6.4 years. Pt to follow up with the [facility] in 3 months and with JA in 09-2015. Patient education completed including shock plan. Vibration demonstrated for patient.

## 2015-01-30 ENCOUNTER — Encounter: Payer: Self-pay | Admitting: Cardiology

## 2015-01-31 ENCOUNTER — Other Ambulatory Visit: Payer: Self-pay | Admitting: Cardiology

## 2015-01-31 NOTE — Telephone Encounter (Signed)
REFILL 

## 2015-02-27 ENCOUNTER — Other Ambulatory Visit: Payer: Self-pay | Admitting: Cardiology

## 2015-02-27 NOTE — Telephone Encounter (Signed)
Rx has been sent to the pharmacy electronically. ° °

## 2015-04-10 ENCOUNTER — Other Ambulatory Visit: Payer: Self-pay | Admitting: Cardiology

## 2015-04-22 ENCOUNTER — Ambulatory Visit (INDEPENDENT_AMBULATORY_CARE_PROVIDER_SITE_OTHER): Payer: Medicare Other | Admitting: *Deleted

## 2015-04-22 ENCOUNTER — Encounter: Payer: Self-pay | Admitting: Internal Medicine

## 2015-04-22 DIAGNOSIS — Z95810 Presence of automatic (implantable) cardiac defibrillator: Secondary | ICD-10-CM

## 2015-04-22 DIAGNOSIS — I469 Cardiac arrest, cause unspecified: Secondary | ICD-10-CM

## 2015-04-22 DIAGNOSIS — R570 Cardiogenic shock: Secondary | ICD-10-CM | POA: Diagnosis not present

## 2015-04-22 LAB — CUP PACEART INCLINIC DEVICE CHECK
HIGH POWER IMPEDANCE MEASURED VALUE: 60.75 Ohm
Implantable Lead Implant Date: 20140408
Implantable Lead Implant Date: 20140408
Implantable Lead Location: 753859
Implantable Lead Location: 753860
Lead Channel Impedance Value: 412.5 Ohm
Lead Channel Pacing Threshold Amplitude: 0.5 V
Lead Channel Pacing Threshold Amplitude: 0.5 V
Lead Channel Pacing Threshold Pulse Width: 0.5 ms
Lead Channel Pacing Threshold Pulse Width: 0.5 ms
Lead Channel Pacing Threshold Pulse Width: 0.5 ms
Lead Channel Sensing Intrinsic Amplitude: 10.7 mV
Lead Channel Sensing Intrinsic Amplitude: 4.9 mV
Lead Channel Setting Pacing Amplitude: 2 V
Lead Channel Setting Pacing Amplitude: 2.5 V
Lead Channel Setting Pacing Pulse Width: 0.5 ms
Lead Channel Setting Sensing Sensitivity: 0.5 mV
MDC IDC MSMT BATTERY REMAINING LONGEVITY: 73.2
MDC IDC MSMT LEADCHNL RA PACING THRESHOLD AMPLITUDE: 0.75 V
MDC IDC MSMT LEADCHNL RA PACING THRESHOLD AMPLITUDE: 0.75 V
MDC IDC MSMT LEADCHNL RA PACING THRESHOLD PULSEWIDTH: 0.5 ms
MDC IDC MSMT LEADCHNL RV IMPEDANCE VALUE: 362.5 Ohm
MDC IDC SESS DTM: 20161114092749
MDC IDC STAT BRADY RA PERCENT PACED: 30 %
MDC IDC STAT BRADY RV PERCENT PACED: 0.59 %
Pulse Gen Serial Number: 7094248

## 2015-04-22 NOTE — Progress Notes (Signed)
ICD check in clinic. Normal device function. Thresholds and sensing consistent with previous device measurements. Impedance trends stable over time. 1 NSVT episode- 15 beats at 207bpm. 10 mode switches- longest 10 seconds. Histogram distribution appropriate for patient and level of activity. Corvue abnormal in October, stable today. Pt denies SOB, LE edema or weight gain. No changes made this session. Device programmed at appropriate safety margins. Device programmed to optimize intrinsic conduction. Estimated longevity 5.1-6.1 years. Pt unable to do remote follow up. ROV with device clinic 07-22-15, ROV with JA 10-21-15.

## 2015-05-11 ENCOUNTER — Other Ambulatory Visit: Payer: Self-pay | Admitting: Cardiology

## 2015-05-13 NOTE — Telephone Encounter (Signed)
REFILL 

## 2015-05-23 ENCOUNTER — Other Ambulatory Visit: Payer: Self-pay | Admitting: *Deleted

## 2015-05-23 MED ORDER — CARVEDILOL 6.25 MG PO TABS: 6.2500 mg | ORAL_TABLET | Freq: Two times a day (BID) | ORAL | Status: DC

## 2015-06-14 ENCOUNTER — Other Ambulatory Visit: Payer: Self-pay | Admitting: *Deleted

## 2015-06-14 MED ORDER — SPIRONOLACTONE 25 MG PO TABS
25.0000 mg | ORAL_TABLET | Freq: Every day | ORAL | Status: DC
Start: 2015-06-14 — End: 2015-08-17

## 2015-06-17 NOTE — Progress Notes (Signed)
HPI: FU coronary artery disease and ischemic cardiomyopathy. Patient is status post coronary artery bypass graft in 1996. Patient had an anterior infarct in December of 0000000 complicated by ventricular fibrillation arrest. Cardiac catheterization revealed sequential saphenous vein graft to the OM1 and OM2 patent, saphenous vein graft to diagonal patent, saphenous vein graft to the right coronary occluded and LIMA to the LAD atretic. The proximal LAD was occluded. Ejection fraction 20%. The patient had PCI of his LAD. Course complicated by hypoxic encephalopathy. Echocardiogram repeated in March of 2014. Ejection fraction 25%. He is status post ICD 4/14. Nuclear study 9/15 showed EF 29, scar, no ischemia. ABIs 9/15 normal. Since last seen, the patient has dyspnea with more extreme activities but not with routine activities. It is relieved with rest. It is not associated with chest pain. There is no orthopnea, PND or pedal edema. There is no syncope or palpitations. There is no exertional chest pain.   Current Outpatient Prescriptions  Medication Sig Dispense Refill  . aspirin EC 81 MG tablet Take 81 mg by mouth daily.    . carvedilol (COREG) 6.25 MG tablet Take 1 tablet (6.25 mg total) by mouth 2 (two) times daily with a meal. 60 tablet 3  . cholecalciferol (VITAMIN D) 1000 UNITS tablet Take 1,000 Units by mouth daily.    Marland Kitchen docusate sodium (COLACE) 100 MG capsule Take 100 mg by mouth 2 (two) times daily as needed for constipation.     . furosemide (LASIX) 40 MG tablet Take 1 tablet (40 mg total) by mouth daily. 30 tablet 3  . lisinopril (PRINIVIL,ZESTRIL) 2.5 MG tablet TAKE 1 TABLET (2.5 MG TOTAL) BY MOUTH DAILY. 30 tablet 1  . Multiple Vitamins-Minerals (PRESERVISION AREDS 2 PO) Take 1 tablet by mouth 2 (two) times daily.     . pravastatin (PRAVACHOL) 40 MG tablet Take 1 tablet (40 mg total) by mouth every evening. 30 tablet 3  . spironolactone (ALDACTONE) 25 MG tablet Take 1 tablet (25 mg  total) by mouth daily. Keep appointment. 90 tablet 0   No current facility-administered medications for this visit.     Past Medical History  Diagnosis Date  . CAD (coronary artery disease)     a. s/p remote CABG, b. AL STEMI c/b VF arrest in 05/2012 => LHC 05/10/12: Severe 3v CAD, S-OM1/2 ok, SVG-Dx ok, SVG-RCA occluded, LIMA-LAD atretic, proximal LAD occluded. EF 20% with anterior apical HK=> salvage PCI with POBA and thrombectomy of the occluded LAD;  c/b hypoxic encephalopathy  . HTN (hypertension)   . Depression   . Congenital funnel chest   . Osteoporosis   . SBO (small bowel obstruction) [facility]) November 2013    s/p lysis of adhesions  . Pectus excavatum   . Anemia   . Clotting disorder ([place])   . Chronic systolic heart failure ([place])     a. Echo 12/13: EF 20-25%, anteroseptal, inferior, apical HK, mildly reduced RV function, trivial pericardial effusion;   b.  Echo 3/14:  Ant, septal, apical AK, EF 25%  . Ischemic cardiomyopathy   . Pulmonary embolism ([place])     a. after adhesiolysis for SBO in 04/2012 => coumadin for 6 mos  . Kidney stones   . ICD (implantable cardiac defibrillator) in place   . STEMI (ST elevation myocardial infarction) ([place]) 05/2012  . Sinoatrial node dysfunction ([facility])   . Cardiac arrest ([place]) 05/2012  . Ventricular fibrillation ([place]) 05/2012    . in setting of AL STEMI 12/13 =>  EF 20-25% => d/c on LifeVest (repeat echo planned in 08/2012)  . Pneumonia 05/2012  . Inguinal hernia     "have one now on my left side" (09/13/2012)  . Cataract     Past Surgical History  Procedure Laterality Date  . Transurethral resection of prostate    . Ptca      percutaneous transluminal coronary intervention and brachy therapy, Bruce R. Olevia Perches, MD. EF 60%  . Laparotomy  04/27/2012    Procedure: EXPLORATORY LAPAROTOMY;  Surgeon: Gwenyth Ober, MD;  Location: [place];  Service: General;  Laterality: N/A;  . Esophagogastroduodenoscopy  05/08/2012    Procedure:  ESOPHAGOGASTRODUODENOSCOPY (EGD);  Surgeon: Juanita Craver, MD;  Location: [facility] ENDOSCOPY;  Service: Endoscopy;  Laterality: N/A;  Nevin Bloodgood put on for Dr. Collene Mares , Dr. Collene Mares will call if she wants another time  . Upper gastrointestinal endoscopy    . Colonoscopy    . Cardiac catheterization    . Cardiac defibrillator placement  09/13/2012    SJM Ellipse ER implanted by Dr Rayann Heman for secondary prevention  . Coronary artery bypass graft  1996    "CABG X5" (09/13/2012)  . Inguinal hernia repair Right 1996  . Cystoscopy/retrograde/ureteroscopy/stone extraction with basket  1990's  . Left heart catheterization with coronary angiogram N/A 05/10/2012    Procedure: LEFT HEART CATHETERIZATION WITH CORONARY ANGIOGRAM;  Surgeon: Burnell Blanks, MD;  Location: [facility] CATH LAB;  Service: Cardiovascular;  Laterality: N/A;  . Implantable cardioverter defibrillator implant N/A 09/13/2012    Procedure: IMPLANTABLE CARDIOVERTER DEFIBRILLATOR IMPLANT;  Surgeon: Thompson Grayer, MD;  Location[facility] CATH LAB;  Service: Cardiovascular;  Laterality: N/A;    Social History   Social History  . Marital Status: Widowed    Spouse Name: N/A  . Number of Children: 1  . Years of Education: N/A   Occupational History  . [name]    Social History Main Topics  . Smoking status: Former Smoker    Types: Cigarettes  . Smokeless tobacco: Never Used     Comment: 09/14/2011 "quit smoking when I was ~ 17; probably didn't smoke 10 packs in my life"  . Alcohol Use: No  . Drug Use: No  . Sexual Activity: Not Currently   Other Topics Concern  . Not on file   Social History Narrative    Family History  Problem Relation Age of Onset  . Colon cancer Brother   . Cancer Brother     Liver  . Heart disease Father   . Heart disease Brother   . Other Mother     brain tumor    ROS: no fevers or chills, productive cough, hemoptysis, dysphasia, odynophagia, melena, hematochezia, dysuria, hematuria, rash, seizure activity,  orthopnea, PND, pedal edema, claudication. Remaining systems are negative.  Physical Exam: Well-developed well-nourished in no acute distress.  Skin is warm and dry.  HEENT is normal.  Neck is supple.  Chest is clear to auscultation with normal expansion. Pectus excavatum Cardiovascular exam is regular rate and rhythm.  Abdominal exam nontender or distended. No masses palpated. Extremities show no edema. neuro grossly intact  ECG Atrial paced rhythm, anterior infarct, anterior and inferior T-wave inversion.

## 2015-06-18 ENCOUNTER — Other Ambulatory Visit: Payer: Self-pay | Admitting: *Deleted

## 2015-06-18 MED ORDER — CARVEDILOL 6.25 MG PO TABS: 6.2500 mg | ORAL_TABLET | Freq: Two times a day (BID) | ORAL | Status: DC

## 2015-06-18 MED ORDER — PRAVASTATIN SODIUM 40 MG PO TABS: 40.0000 mg | ORAL_TABLET | Freq: Every evening | ORAL | Status: DC

## 2015-06-18 MED ORDER — FUROSEMIDE 40 MG PO TABS: 40.0000 mg | ORAL_TABLET | Freq: Every day | ORAL | Status: DC

## 2015-06-21 ENCOUNTER — Encounter: Payer: Self-pay | Admitting: Cardiology

## 2015-06-21 ENCOUNTER — Ambulatory Visit (INDEPENDENT_AMBULATORY_CARE_PROVIDER_SITE_OTHER): Payer: Medicare Other | Admitting: Cardiology

## 2015-06-21 VITALS — BP 120/62 | HR 66 | Ht 69.0 in | Wt 136.0 lb

## 2015-06-21 DIAGNOSIS — I25810 Atherosclerosis of coronary artery bypass graft(s) without angina pectoris: Secondary | ICD-10-CM

## 2015-06-21 MED ORDER — ATORVASTATIN CALCIUM 80 MG PO TABS: 80.0000 mg | ORAL_TABLET | Freq: Every day | ORAL | Status: DC

## 2015-06-21 NOTE — Assessment & Plan Note (Signed)
Continue ACE inhibitor and beta blocker. 

## 2015-06-21 NOTE — Assessment & Plan Note (Signed)
Blood pressure controlled. Continue present medications. Check potassium and renal function. 

## 2015-06-21 NOTE — Assessment & Plan Note (Signed)
Followed by electrophysiology. 

## 2015-06-21 NOTE — Patient Instructions (Signed)
Medication Instructions:   STOP PRAVASTATIN  START ATORVASTATIN 80 MG ONCE DAILY  Labwork:  Your physician recommends that you return for lab work in: [place] ATORVASTATIN= DO NOT EAT PRIOR TO LAB WORK  Follow-Up:  Your physician wants you to follow-up in: [place] will receive a reminder letter in the mail two months in advance. If you don't receive a letter, please call our office to schedule the follow-up appointment.   If you need a refill on your cardiac medications before your next appointment, please call your pharmacy.

## 2015-06-21 NOTE — Assessment & Plan Note (Signed)
Continue aspirin and statin. 

## 2015-06-21 NOTE — Assessment & Plan Note (Signed)
Change Pravachol to Lipitor 80 mg daily. Check lipids and liver in 4 Weeks.

## 2015-06-24 ENCOUNTER — Other Ambulatory Visit: Payer: Self-pay | Admitting: *Deleted

## 2015-06-24 MED ORDER — LISINOPRIL 2.5 MG PO TABS: 2.5000 mg | ORAL_TABLET | Freq: Every day | ORAL | Status: DC

## 2015-07-22 ENCOUNTER — Encounter: Payer: Self-pay | Admitting: Internal Medicine

## 2015-07-22 ENCOUNTER — Ambulatory Visit (INDEPENDENT_AMBULATORY_CARE_PROVIDER_SITE_OTHER): Payer: Medicare Other | Admitting: *Deleted

## 2015-07-22 DIAGNOSIS — Z95810 Presence of automatic (implantable) cardiac defibrillator: Secondary | ICD-10-CM

## 2015-07-22 LAB — CUP PACEART INCLINIC DEVICE CHECK
Battery Remaining Longevity: 73.2
Brady Statistic RA Percent Paced: 25 %
HighPow Impedance: 58.5 Ohm
Implantable Lead Implant Date: 20140408
Implantable Lead Location: 753859
Implantable Lead Location: 753860
Lead Channel Impedance Value: 412.5 Ohm
Lead Channel Pacing Threshold Amplitude: 0.5 V
Lead Channel Pacing Threshold Amplitude: 0.5 V
Lead Channel Pacing Threshold Amplitude: 0.75 V
Lead Channel Pacing Threshold Pulse Width: 0.5 ms
Lead Channel Pacing Threshold Pulse Width: 0.5 ms
Lead Channel Pacing Threshold Pulse Width: 0.5 ms
Lead Channel Pacing Threshold Pulse Width: 0.5 ms
Lead Channel Sensing Intrinsic Amplitude: 10.4 mV
Lead Channel Sensing Intrinsic Amplitude: 3.4 mV
Lead Channel Setting Pacing Amplitude: 2 V
Lead Channel Setting Pacing Pulse Width: 0.5 ms
MDC IDC LEAD IMPLANT DT: 20140408
MDC IDC MSMT LEADCHNL RA PACING THRESHOLD AMPLITUDE: 0.75 V
MDC IDC MSMT LEADCHNL RV IMPEDANCE VALUE: 362.5 Ohm
MDC IDC PG SERIAL: 7094248
MDC IDC SESS DTM: 20170213092304
MDC IDC SET LEADCHNL RV PACING AMPLITUDE: 2.5 V
MDC IDC SET LEADCHNL RV SENSING SENSITIVITY: 0.5 mV
MDC IDC STAT BRADY RV PERCENT PACED: 0.1 %

## 2015-07-22 NOTE — Progress Notes (Signed)
ICD check in clinic. Normal device function. Thresholds and sensing consistent with previous device measurements. Impedance trends stable over time. No evidence of any ventricular arrhythmias. 10 mode switches- longest 10 seconds, pk A 216bpm. Histogram distribution appropriate for patient and level of activity. Corvue abnormal 07/08/15 for 10 days, patient denies SOB, LE edema (lasix daily). No changes made this session. Device programmed at appropriate safety margins. Device programmed to optimize intrinsic conduction. Estimated longevity 5-6.1 years. Pt unable to follow up remotely due to living in a remote area. ROV with JA 10/21/15.

## 2015-07-29 ENCOUNTER — Encounter: Payer: Self-pay | Admitting: *Deleted

## 2015-07-29 LAB — LIPID PANEL
CHOLESTEROL: 83 mg/dL — AB (ref 125–200)
HDL: 35 mg/dL — ABNORMAL LOW (ref >=40)
LDL Cholesterol: 33 mg/dL (ref <130)
TRIGLYCERIDES: 73 mg/dL (ref <150)
Total CHOL/HDL Ratio: 2.4 Ratio (ref <=5.0)
VLDL: 15 mg/dL (ref <30)

## 2015-07-29 LAB — HEPATIC FUNCTION PANEL
ALT: 14 U/L (ref 9–46)
AST: 20 U/L (ref 10–35)
Albumin: 3.8 g/dL (ref 3.6–5.1)
Alkaline Phosphatase: 66 U/L (ref 40–115)
Bilirubin, Direct: 0.2 mg/dL (ref <=0.2)
Indirect Bilirubin: 0.5 mg/dL (ref 0.2–1.2)
TOTAL PROTEIN: 6.1 g/dL (ref 6.1–8.1)
Total Bilirubin: 0.7 mg/dL (ref 0.2–1.2)

## 2015-08-06 ENCOUNTER — Other Ambulatory Visit: Payer: Self-pay | Admitting: Cardiology

## 2015-08-06 NOTE — Telephone Encounter (Signed)
Rx request sent to pharmacy.  

## 2015-08-17 ENCOUNTER — Other Ambulatory Visit: Payer: Self-pay | Admitting: Cardiology

## 2015-10-21 ENCOUNTER — Encounter: Payer: Self-pay | Admitting: Internal Medicine

## 2015-10-21 ENCOUNTER — Ambulatory Visit (INDEPENDENT_AMBULATORY_CARE_PROVIDER_SITE_OTHER): Payer: Medicare Other | Admitting: Internal Medicine

## 2015-10-21 VITALS — BP 118/70 | HR 96 | Ht 71.0 in | Wt 133.8 lb

## 2015-10-21 DIAGNOSIS — I255 Ischemic cardiomyopathy: Secondary | ICD-10-CM

## 2015-10-21 LAB — CUP PACEART INCLINIC DEVICE CHECK
Brady Statistic RA Percent Paced: 26 %
Brady Statistic RV Percent Paced: 0.08 %
Date Time Interrogation Session: 20170515133322
HIGH POWER IMPEDANCE MEASURED VALUE: 64.125
Implantable Lead Implant Date: 20140408
Implantable Lead Location: 753860
Lead Channel Impedance Value: 350 Ohm
Lead Channel Pacing Threshold Amplitude: 0.5 V
Lead Channel Pacing Threshold Amplitude: 0.75 V
Lead Channel Pacing Threshold Pulse Width: 0.5 ms
Lead Channel Sensing Intrinsic Amplitude: 11.7 mV
Lead Channel Setting Pacing Amplitude: 2 V
Lead Channel Setting Pacing Pulse Width: 0.5 ms
Lead Channel Setting Sensing Sensitivity: 0.5 mV
MDC IDC LEAD IMPLANT DT: 20140408
MDC IDC LEAD LOCATION: 753859
MDC IDC MSMT BATTERY REMAINING LONGEVITY: 69.6
MDC IDC MSMT LEADCHNL RA IMPEDANCE VALUE: 412.5 Ohm
MDC IDC MSMT LEADCHNL RA PACING THRESHOLD AMPLITUDE: 0.75 V
MDC IDC MSMT LEADCHNL RA PACING THRESHOLD PULSEWIDTH: 0.5 ms
MDC IDC MSMT LEADCHNL RA SENSING INTR AMPL: 5 mV
MDC IDC MSMT LEADCHNL RV PACING THRESHOLD AMPLITUDE: 0.5 V
MDC IDC MSMT LEADCHNL RV PACING THRESHOLD PULSEWIDTH: 0.5 ms
MDC IDC MSMT LEADCHNL RV PACING THRESHOLD PULSEWIDTH: 0.5 ms
MDC IDC PG SERIAL: 7094248
MDC IDC SET LEADCHNL RV PACING AMPLITUDE: 2.5 V

## 2015-10-21 NOTE — Patient Instructions (Signed)
Medication Instructions:  Your physician recommends that you continue on your current medications as directed. Please refer to the Current Medication list given to you today.   Labwork: None ordered   Testing/Procedures: None ordered   Follow-Up: Your physician recommends that you schedule a follow-up appointment in: 3 months with device clinic and 12 months with Chanetta Marshall, NP   Any Other Special Instructions Will Be Listed Below (If Applicable).     If you need a refill on your cardiac medications before your next appointment, please call your pharmacy.

## 2015-10-21 NOTE — Progress Notes (Signed)
Electrophysiology Office Note   Date:  10/21/2015   ID:  [name], DOB [birth year], MRN [id number]  PCP:  [name], MD  Cardiologist:  Dr Stanford Breed Primary Electrophysiologist: Thompson Grayer, MD    Chief Complaint  Patient presents with  . Congestive Heart Failure     History of Present Illness: Louis Reid is a 82 y.o. male who presents today for electrophysiology evaluation.   He is doing well.  Denies cardiac issues.  Today, he denies symptoms of palpitations, chest pain, shortness of breath, orthopnea, PND, lower extremity edema, claudication, dizziness, presyncope, syncope, bleeding, or neurologic sequela. The patient is tolerating medications without difficulties and is otherwise without complaint today.    Past Medical History  Diagnosis Date  . CAD (coronary artery disease)     a. s/p remote CABG, b. AL STEMI c/b VF arrest in 05/2012 => LHC 05/10/12: Severe 3v CAD, S-OM1/2 ok, SVG-Dx ok, SVG-RCA occluded, LIMA-LAD atretic, proximal LAD occluded. EF 20% with anterior apical HK=> salvage PCI with POBA and thrombectomy of the occluded LAD;  c/b hypoxic encephalopathy  . HTN (hypertension)   . Depression   . Congenital funnel chest   . Osteoporosis   . SBO (small bowel obstruction) [facility]) November 2013    s/p lysis of adhesions  . Pectus excavatum   . Anemia   . Clotting disorder ([place])   . Chronic systolic heart failure ([place])     a. Echo 12/13: EF 20-25%, anteroseptal, inferior, apical HK, mildly reduced RV function, trivial pericardial effusion;   b.  Echo 3/14:  Ant, septal, apical AK, EF 25%  . Ischemic cardiomyopathy   . Pulmonary embolism ([place])     a. after adhesiolysis for SBO in 04/2012 => coumadin for 6 mos  . Kidney stones   . ICD (implantable cardiac defibrillator) in place   . STEMI (ST elevation myocardial infarction) ([place]) 05/2012  . Sinoatrial node dysfunction (HCC)   . Cardiac arrest ([place]) 05/2012  . Ventricular fibrillation ([place])  05/2012    . in setting of AL STEMI 12/13 => EF 20-25% => d/c on LifeVest (repeat echo planned in 08/2012)  . Pneumonia 05/2012  . Inguinal hernia     "have one now on my left side" (09/13/2012)  . Cataract    Past Surgical History  Procedure Laterality Date  . Transurethral resection of prostate    . Ptca      percutaneous transluminal coronary intervention and brachy therapy, Bruce R. Olevia Perches, MD. EF 60%  . Laparotomy  04/27/2012    Procedure: EXPLORATORY LAPAROTOMY;  Surgeon: Gwenyth Ober, MD;  Location: [place];  Service: General;  Laterality: N/A;  . Esophagogastroduodenoscopy  05/08/2012    Procedure: ESOPHAGOGASTRODUODENOSCOPY (EGD);  Surgeon: Juanita Craver, MD;  Location: [facility] ENDOSCOPY;  Service: Endoscopy;  Laterality: N/A;  Nevin Bloodgood put on for Dr. Collene Mares , Dr. Collene Mares will call if she wants another time  . Upper gastrointestinal endoscopy    . Colonoscopy    . Cardiac catheterization    . Cardiac defibrillator placement  09/13/2012    SJM Ellipse ER implanted by Dr Rayann Heman for secondary prevention  . Coronary artery bypass graft  1996    "CABG X5" (09/13/2012)  . Inguinal hernia repair Right 1996  . Cystoscopy/retrograde/ureteroscopy/stone extraction with basket  1990's  . Left heart catheterization with coronary angiogram N/A 05/10/2012    Procedure: LEFT HEART CATHETERIZATION WITH CORONARY ANGIOGRAM;  Surgeon: Burnell Blanks, MD;  Location: [facility] CATH LAB;  Service: Cardiovascular;  Laterality: N/A;  . Implantable cardioverter defibrillator implant N/A 09/13/2012    Procedure: IMPLANTABLE CARDIOVERTER DEFIBRILLATOR IMPLANT;  Surgeon: Thompson Grayer, MD;  Location: [facility] CATH LAB;  Service: Cardiovascular;  Laterality: N/A;     Current Outpatient Prescriptions  Medication Sig Dispense Refill  . aspirin EC 81 MG tablet Take 81 mg by mouth daily.    Marland Kitchen atorvastatin (LIPITOR) 80 MG tablet Take 1 tablet (80 mg total) by mouth daily. 90 tablet 3  . carvedilol (COREG) 6.25 MG tablet Take 1 tablet by  mouth two  times daily with meals 180 tablet 3  . cholecalciferol (VITAMIN D) 1000 UNITS tablet Take 1,000 Units by mouth daily.    Marland Kitchen docusate sodium (COLACE) 100 MG capsule Take 100 mg by mouth 2 (two) times daily as needed for constipation.     . furosemide (LASIX) 40 MG tablet Take 1 tablet by mouth  daily 90 tablet 3  . lisinopril (PRINIVIL,ZESTRIL) 2.5 MG tablet Take 1 tablet (2.5 mg total) by mouth daily. 30 tablet 11  . spironolactone (ALDACTONE) 25 MG tablet Take 1 tablet (25 mg total) by mouth daily. 90 tablet 3   No current facility-administered medications for this visit.    Allergies:   Cold medicine plus   Social History:  The patient  reports that he has quit smoking. His smoking use included Cigarettes. He has never used smokeless tobacco. He reports that he does not drink alcohol or use illicit drugs.   Family History:  The patient's family history includes Cancer in his brother; Colon cancer in his brother; Heart disease in his brother and father; Other in his mother.    ROS:  Please see the history of present illness.   All other systems are reviewed and negative.    PHYSICAL EXAM: VS:  BP 118/70 mmHg  Pulse 96  Ht 5\' 11"  (1.803 m)  Wt 133 lb 12.8 oz (60.691 kg)  BMI 18.67 kg/m2  SpO2 96% , BMI Body mass index is 18.67 kg/(m^2). GEN: Well nourished, well developed, in no acute distress HEENT: normal Neck: no JVD, carotid bruits, or masses Cardiac: RRR; no murmurs, rubs, or gallops,no edema  Respiratory:  clear to auscultation bilaterally, normal work of breathing GI: soft, nontender, nondistended, + BS MS: no deformity or atrophy Skin: warm and dry, device pocket is well healed Neuro:  Strength and sensation are intact Psych: euthymic mood, full affect  Device interrogation is reviewed today in detail.  See PaceArt for details.   Recent Labs: 07/29/2015: ALT 14    Lipid Panel     Component Value Date/Time   CHOL 83* 07/29/2015 0837   TRIG 73  07/29/2015 0837   HDL 35* 07/29/2015 0837   CHOLHDL 2.4 07/29/2015 0837   VLDL 15 07/29/2015 0837   LDLCALC 33 07/29/2015 0837     Wt Readings from Last 3 Encounters:  10/21/15 133 lb 12.8 oz (60.691 kg)  06/21/15 136 lb (61.689 kg)  09/24/14 137 lb 9.6 oz (62.415 kg)      Other studies Reviewed: Additional studies/ records that were reviewed today include: Dr Lonia Skinner notes    ASSESSMENT AND PLAN:  1.  Ischemic CM with prior VF arrest Normal ICD function See Pace Art report No changes today Device is not on any recalls currently  2. CAD No ischemic symptoms  Unable to do remots due to poor cell reception where he lives Return to see EP NP in 1 year  Current medicines are  reviewed at length with the patient today.   The patient does not have concerns regarding his medicines.  The following changes were made today:  none   Signed, Thompson Grayer, MD  10/21/2015 9:54 AM     [facility] [address] [place]  [address] [phone] (office) [phone] (fax)

## 2015-12-09 ENCOUNTER — Telehealth: Payer: Self-pay | Admitting: *Deleted

## 2015-12-09 MED ORDER — FUROSEMIDE 20 MG PO TABS: 20.0000 mg | ORAL_TABLET | Freq: Every day | ORAL | Status: DC

## 2015-12-09 NOTE — Telephone Encounter (Signed)
Refill sent to the pharmacy electronically.  

## 2015-12-09 NOTE — Telephone Encounter (Signed)
Patient left a msg on the refill vm stating that the furosemide dose was reduced to 20 mg. He would like an updated rx [facility] so that he does not have to split the tablets. Thanks, MI

## 2016-01-09 ENCOUNTER — Encounter: Payer: Self-pay | Admitting: Internal Medicine

## 2016-02-17 ENCOUNTER — Encounter: Payer: Self-pay | Admitting: Internal Medicine

## 2016-02-17 ENCOUNTER — Ambulatory Visit (INDEPENDENT_AMBULATORY_CARE_PROVIDER_SITE_OTHER): Payer: Medicare Other | Admitting: *Deleted

## 2016-02-17 DIAGNOSIS — I255 Ischemic cardiomyopathy: Secondary | ICD-10-CM

## 2016-02-17 LAB — CUP PACEART INCLINIC DEVICE CHECK
Battery Remaining Longevity: 67.2
Brady Statistic RA Percent Paced: 29 %
Brady Statistic RV Percent Paced: 0.16 %
HighPow Impedance: 58.5 Ohm
Implantable Lead Implant Date: 20140408
Implantable Lead Implant Date: 20140408
Implantable Lead Location: 753860
Lead Channel Impedance Value: 337.5 Ohm
Lead Channel Impedance Value: 400 Ohm
Lead Channel Pacing Threshold Pulse Width: 0.5 ms
Lead Channel Sensing Intrinsic Amplitude: 4 mV
Lead Channel Setting Pacing Amplitude: 2.5 V
MDC IDC LEAD LOCATION: 753859
MDC IDC MSMT LEADCHNL RA PACING THRESHOLD AMPLITUDE: 0.75 V
MDC IDC MSMT LEADCHNL RV PACING THRESHOLD AMPLITUDE: 0.5 V
MDC IDC MSMT LEADCHNL RV PACING THRESHOLD PULSEWIDTH: 0.5 ms
MDC IDC MSMT LEADCHNL RV SENSING INTR AMPL: 11.1 mV
MDC IDC SESS DTM: 20170911095824
MDC IDC SET LEADCHNL RA PACING AMPLITUDE: 2 V
MDC IDC SET LEADCHNL RV PACING PULSEWIDTH: 0.5 ms
MDC IDC SET LEADCHNL RV SENSING SENSITIVITY: 0.5 mV
Pulse Gen Serial Number: 7094248

## 2016-02-17 NOTE — Progress Notes (Signed)
ICD check in clinic. Normal device function. Thresholds and sensing consistent with previous device measurements. Impedance trends stable over time. No evidence of any ventricular arrhythmias. 28 mode switches (<1% burden). Histogram distribution appropriate for patient and level of activity. No changes made this session. Device programmed at appropriate safety margins. Device programmed to optimize intrinsic conduction. Estimated longevity 4.6-5.63yrs. Pt unable to do remote monitoring. ROV in [place] 12/11 and JA 10/2016. Patient education completed including shock plan. Vibratory alert demonstrated for patient.

## 2016-03-17 DIAGNOSIS — I480 Paroxysmal atrial fibrillation: Secondary | ICD-10-CM

## 2016-03-17 HISTORY — DX: Paroxysmal atrial fibrillation: I48.0

## 2016-05-09 ENCOUNTER — Other Ambulatory Visit: Payer: Self-pay | Admitting: Cardiology

## 2016-05-09 DIAGNOSIS — I25810 Atherosclerosis of coronary artery bypass graft(s) without angina pectoris: Secondary | ICD-10-CM

## 2016-05-18 ENCOUNTER — Encounter: Payer: Self-pay | Admitting: Internal Medicine

## 2016-05-18 ENCOUNTER — Ambulatory Visit (INDEPENDENT_AMBULATORY_CARE_PROVIDER_SITE_OTHER): Payer: Medicare Other | Admitting: *Deleted

## 2016-05-18 DIAGNOSIS — I5021 Acute systolic (congestive) heart failure: Secondary | ICD-10-CM | POA: Diagnosis not present

## 2016-05-18 DIAGNOSIS — I255 Ischemic cardiomyopathy: Secondary | ICD-10-CM | POA: Diagnosis not present

## 2016-05-18 DIAGNOSIS — Z95810 Presence of automatic (implantable) cardiac defibrillator: Secondary | ICD-10-CM

## 2016-05-18 LAB — CUP PACEART INCLINIC DEVICE CHECK
Brady Statistic RA Percent Paced: 28 %
HighPow Impedance: 51.75 Ohm
Implantable Lead Location: 753859
Lead Channel Pacing Threshold Amplitude: 0.75 V
Lead Channel Pacing Threshold Pulse Width: 0.5 ms
Lead Channel Sensing Intrinsic Amplitude: 11.7 mV
Lead Channel Sensing Intrinsic Amplitude: 4.4 mV
Lead Channel Setting Pacing Pulse Width: 0.5 ms
MDC IDC LEAD IMPLANT DT: 20140408
MDC IDC LEAD IMPLANT DT: 20140408
MDC IDC LEAD LOCATION: 753860
MDC IDC MSMT LEADCHNL RA IMPEDANCE VALUE: 400 Ohm
MDC IDC MSMT LEADCHNL RV IMPEDANCE VALUE: 350 Ohm
MDC IDC MSMT LEADCHNL RV PACING THRESHOLD AMPLITUDE: 0.5 V
MDC IDC MSMT LEADCHNL RV PACING THRESHOLD PULSEWIDTH: 0.5 ms
MDC IDC PG IMPLANT DT: 20140408
MDC IDC SESS DTM: 20171211101354
MDC IDC SET LEADCHNL RA PACING AMPLITUDE: 2 V
MDC IDC SET LEADCHNL RV PACING AMPLITUDE: 2.5 V
MDC IDC SET LEADCHNL RV SENSING SENSITIVITY: 0.5 mV
MDC IDC STAT BRADY RV PERCENT PACED: 0.15 %
Pulse Gen Serial Number: 7094248

## 2016-05-18 NOTE — Progress Notes (Signed)
ICD check in clinic. Normal device function. Thresholds and sensing consistent with previous device measurements. Impedance trends stable over time. 1 NSVT episode, 21bts. Morphology template reprogrammed to far field, 70% match per industry recommendations due to initial inappropriate template match, template updated after changes. Chamber onset discriminator turned on and sudden onset discriminator turned off per industry recommendations. 46 mode switches (<1%), AT/AF, longest true AF 2hrs 72min, +ASA 81mg . No OAC at this time, continue to monitor burden per JA. Histogram distribution appropriate for patient and level of activity. CorVue stable. Device programmed at appropriate safety margins. Device programmed to optimize intrinsic conduction. Estimated longevity 4.4-5.4 years. Pt unable to use Merlin due to lack of cellular signal at home. Patient education completed including shock plan. ROV with Device Clinic on 08/17/16 and ROV with AS in 10/2016.

## 2016-05-25 ENCOUNTER — Other Ambulatory Visit: Payer: Self-pay | Admitting: *Deleted

## 2016-05-25 ENCOUNTER — Other Ambulatory Visit: Payer: Self-pay | Admitting: Cardiology

## 2016-05-25 MED ORDER — LISINOPRIL 2.5 MG PO TABS: 2.5000 mg | ORAL_TABLET | Freq: Every day | ORAL | 1 refills | Status: DC

## 2016-05-27 ENCOUNTER — Other Ambulatory Visit: Payer: Self-pay

## 2016-05-27 DIAGNOSIS — I25810 Atherosclerosis of coronary artery bypass graft(s) without angina pectoris: Secondary | ICD-10-CM

## 2016-05-27 MED ORDER — ATORVASTATIN CALCIUM 80 MG PO TABS: 80.0000 mg | ORAL_TABLET | Freq: Every day | ORAL | 0 refills | Status: DC

## 2016-06-18 NOTE — Progress Notes (Signed)
HPI: FU coronary artery disease and ischemic cardiomyopathy. Patient is status post coronary artery bypass graft in 1996. Patient had an anterior infarct in December of 0000000 complicated by ventricular fibrillation arrest. Cardiac catheterization revealed sequential saphenous vein graft to the OM1 and OM2 patent, saphenous vein graft to diagonal patent, saphenous vein graft to the right coronary occluded and LIMA to the LAD atretic. The proximal LAD was occluded. Ejection fraction 20%. The patient had PCI of his LAD. Course complicated by hypoxic encephalopathy. Echocardiogram repeated in March of 2014. Ejection fraction 25%. He is status post ICD 4/14. Nuclear study 9/15 showed EF 29, scar, no ischemia. ABIs 9/15 normal. Since last seen, the patient has dyspnea with more extreme activities but not with routine activities. It is relieved with rest. It is not associated with chest pain. There is no orthopnea, PND or pedal edema. There is no syncope or palpitations. There is no exertional chest pain. Occasional neck tightness with stress but no exertional chest pain.   Current Outpatient Prescriptions  Medication Sig Dispense Refill  . aspirin EC 81 MG tablet Take 81 mg by mouth daily.    Marland Kitchen atorvastatin (LIPITOR) 80 MG tablet Take 1 tablet (80 mg total) by mouth daily. 90 tablet 0  . carvedilol (COREG) 6.25 MG tablet TAKE 1 TABLET BY MOUTH TWO  TIMES DAILY WITH MEALS 180 tablet 3  . cholecalciferol (VITAMIN D) 1000 UNITS tablet Take 1,000 Units by mouth daily.    Marland Kitchen docusate sodium (COLACE) 100 MG capsule Take 100 mg by mouth 2 (two) times daily as needed for constipation.     . furosemide (LASIX) 20 MG tablet Take 1 tablet (20 mg total) by mouth daily. (Patient taking differently: Take 40 mg by mouth daily. ) 90 tablet 3  . lisinopril (PRINIVIL,ZESTRIL) 2.5 MG tablet Take 1 tablet (2.5 mg total) by mouth daily. 90 tablet 1  . Multiple Vitamins-Minerals (ICAPS AREDS 2 PO) Take 1 tablet by mouth  daily.    Marland Kitchen spironolactone (ALDACTONE) 25 MG tablet TAKE 1 TABLET BY MOUTH  DAILY 90 tablet 3   No current facility-administered medications for this visit.      Past Medical History:  Diagnosis Date  . Anemia   . CAD (coronary artery disease)    a. s/p remote CABG, b. AL STEMI c/b VF arrest in 05/2012 => LHC 05/10/12: Severe 3v CAD, S-OM1/2 ok, SVG-Dx ok, SVG-RCA occluded, LIMA-LAD atretic, proximal LAD occluded. EF 20% with anterior apical HK=> salvage PCI with POBA and thrombectomy of the occluded LAD;  c/b hypoxic encephalopathy  . Cardiac arrest ([place]) 05/2012  . Cataract   . Chronic systolic heart failure ([place])    a. Echo 12/13: EF 20-25%, anteroseptal, inferior, apical HK, mildly reduced RV function, trivial pericardial effusion;   b.  Echo 3/14:  Ant, septal, apical AK, EF 25%  . Clotting disorder ([place])   . Congenital funnel chest   . Depression   . HTN (hypertension)   . ICD (implantable cardiac defibrillator) in place   . Inguinal hernia    "have one now on my left side" (09/13/2012)  . Ischemic cardiomyopathy   . Kidney stones   . Osteoporosis   . Paroxysmal atrial fibrillation ([place]) 03/17/2016   noted on device interrogation,  will follow burden  . Pectus excavatum   . Pneumonia 05/2012  . Pulmonary embolism ([place])    a. after adhesiolysis for SBO in 04/2012 => coumadin for 6 mos  . SBO (  small bowel obstruction) November 2013   s/p lysis of adhesions  . Sinoatrial node dysfunction (H[facility])   . STEMI (ST elevation myocardial infarction) ([place]) 05/2012  . Ventricular fibrillation ([place]) 05/2012   . in setting of AL STEMI 12/13 => EF 20-25% => d/c on LifeVest (repeat echo planned in 08/2012)    Past Surgical History:  Procedure Laterality Date  . CARDIAC CATHETERIZATION    . CARDIAC DEFIBRILLATOR PLACEMENT  09/13/2012   SJM Ellipse ER implanted by Dr Rayann Heman for secondary prevention  . COLONOSCOPY    . CORONARY ARTERY BYPASS GRAFT  1996   "CABG X5" (09/13/2012)  .  CYSTOSCOPY/RETROGRADE/URETEROSCOPY/STONE EXTRACTION WITH BASKET  1990's  . ESOPHAGOGASTRODUODENOSCOPY  05/08/2012   Procedure: ESOPHAGOGASTRODUODENOSCOPY (EGD);  Surgeon: Juanita Craver, MD;  Location: [facility] ENDOSCOPY;  Service: Endoscopy;  Laterality: N/A;  Nevin Bloodgood put on for Dr. Collene Mares , Dr. Collene Mares will call if she wants another time  . IMPLANTABLE CARDIOVERTER DEFIBRILLATOR IMPLANT N/A 09/13/2012   Procedure: IMPLANTABLE CARDIOVERTER DEFIBRILLATOR IMPLANT;  Surgeon: Thompson Grayer, MD;  Location: [facility] CATH LAB;  Service: Cardiovascular;  Laterality: N/A;  . INGUINAL HERNIA REPAIR Right 1996  . LAPAROTOMY  04/27/2012   Procedure: EXPLORATORY LAPAROTOMY;  Surgeon: Gwenyth Ober, MD;  Location: [place];  Service: General;  Laterality: N/A;  . LEFT HEART CATHETERIZATION WITH CORONARY ANGIOGRAM N/A 05/10/2012   Procedure: LEFT HEART CATHETERIZATION WITH CORONARY ANGIOGRAM;  Surgeon: Burnell Blanks, MD;  Location: [facility] CATH LAB;  Service: Cardiovascular;  Laterality: N/A;  . PTCA     percutaneous transluminal coronary intervention and brachy therapy, Bruce R. Olevia Perches, MD. EF 60%  . TRANSURETHRAL RESECTION OF PROSTATE    . UPPER GASTROINTESTINAL ENDOSCOPY      Social History   Social History  . Marital status: Widowed    Spouse name: N/A  . Number of children: 1  . Years of education: N/A   Occupational History  . [name] Retired   Social History Main Topics  . Smoking status: Former Smoker    Types: Cigarettes  . Smokeless tobacco: Never Used     Comment: 09/14/2011 "quit smoking when I was ~ 17; probably didn't smoke 10 packs in my life"  . Alcohol use No  . Drug use: No  . Sexual activity: Not Currently   Other Topics Concern  . Not on file   Social History Narrative  . No narrative on file    Family History  Problem Relation Age of Onset  . Heart disease Father   . Other Mother     brain tumor  . Colon cancer Brother   . Cancer Brother     Liver  . Heart disease Brother       ROS: no fevers or chills, productive cough, hemoptysis, dysphasia, odynophagia, melena, hematochezia, dysuria, hematuria, rash, seizure activity, orthopnea, PND, pedal edema, claudication. Remaining systems are negative.  Physical Exam: Well-developed well-nourished in no acute distress.  Skin is warm and dry.  HEENT is normal.  Neck is supple.  Chest is clear to auscultation with normal expansion. Pectus excavatum Cardiovascular exam is regular rate and rhythm.  Abdominal exam nontender or distended. No masses palpated. Extremities show no edema. neuro grossly intact  ECG-Sinus rhythm at a rate of 70. First degree AV block. Low voltage. Anterior infarct. Inferior lateral T-wave inversion.   A/P  1 Coronary artery disease-continue aspirin and statin.  2 hypertension-blood pressure controlled. Continue present medications. Check potassium and renal function.  3 hyperlipidemia-continue  statin. Check lipids and liver.  4 ICD-followed by electrophysiology.  5 ischemic cardiomyopathy-continue ACE inhibitor and beta blocker.  Kirk Ruths, MD

## 2016-06-29 ENCOUNTER — Ambulatory Visit (INDEPENDENT_AMBULATORY_CARE_PROVIDER_SITE_OTHER): Payer: Medicare Other | Admitting: Cardiology

## 2016-06-29 ENCOUNTER — Encounter: Payer: Self-pay | Admitting: Cardiology

## 2016-06-29 VITALS — BP 116/74 | HR 70 | Ht 71.0 in | Wt 130.0 lb

## 2016-06-29 DIAGNOSIS — I2510 Atherosclerotic heart disease of native coronary artery without angina pectoris: Secondary | ICD-10-CM | POA: Diagnosis not present

## 2016-06-29 DIAGNOSIS — I255 Ischemic cardiomyopathy: Secondary | ICD-10-CM

## 2016-06-29 DIAGNOSIS — Z95810 Presence of automatic (implantable) cardiac defibrillator: Secondary | ICD-10-CM

## 2016-06-29 DIAGNOSIS — I25810 Atherosclerosis of coronary artery bypass graft(s) without angina pectoris: Secondary | ICD-10-CM

## 2016-06-29 NOTE — Patient Instructions (Signed)
Medication Instructions:   NO CHANGE  Labwork:  Your physician recommends that you return for lab work WHEN FASTING AT [facility] [address]RE[address]=1ST FLOOR  Follow-Up:  Your physician wants you to follow-up in: [place] will receive a reminder letter in the mail two months in advance. If you don't receive a letter, please call our office to schedule the follow-up appointment.   If you need a refill on your cardiac medications before your next appointment, please call your pharmacy.

## 2016-08-24 ENCOUNTER — Other Ambulatory Visit: Payer: Medicare Other | Admitting: *Deleted

## 2016-08-24 ENCOUNTER — Ambulatory Visit (INDEPENDENT_AMBULATORY_CARE_PROVIDER_SITE_OTHER): Payer: Medicare Other | Admitting: *Deleted

## 2016-08-24 DIAGNOSIS — I255 Ischemic cardiomyopathy: Secondary | ICD-10-CM

## 2016-08-24 DIAGNOSIS — I5022 Chronic systolic (congestive) heart failure: Secondary | ICD-10-CM

## 2016-08-24 DIAGNOSIS — R0602 Shortness of breath: Secondary | ICD-10-CM

## 2016-08-24 DIAGNOSIS — I2510 Atherosclerotic heart disease of native coronary artery without angina pectoris: Secondary | ICD-10-CM

## 2016-08-24 LAB — BASIC METABOLIC PANEL
BUN / CREAT RATIO: 22 (ref 10–24)
BUN: 28 mg/dL — AB (ref 8–27)
CO2: 24 mmol/L (ref 18–29)
CREATININE: 1.25 mg/dL (ref 0.76–1.27)
Calcium: 8.6 mg/dL (ref 8.6–10.2)
Chloride: 104 mmol/L (ref 96–106)
GFR calc non Af Amer: 54 mL/min/{1.73_m2} — ABNORMAL LOW (ref >59)
GFR, EST AFRICAN AMERICAN: 62 mL/min/{1.73_m2} (ref >59)
GLUCOSE: 95 mg/dL (ref 65–99)
Potassium: 4.4 mmol/L (ref 3.5–5.2)
SODIUM: 143 mmol/L (ref 134–144)

## 2016-08-24 LAB — CBC WITH DIFFERENTIAL/PLATELET
Basophils Absolute: 0 10*3/uL (ref 0.0–0.2)
Basos: 1 %
EOS (ABSOLUTE): 0.1 10*3/uL (ref 0.0–0.4)
EOS: 1 %
HEMATOCRIT: 36.2 % — AB (ref 37.5–51.0)
HEMOGLOBIN: 11.8 g/dL — AB (ref 13.0–17.7)
Immature Grans (Abs): 0 10*3/uL (ref 0.0–0.1)
Immature Granulocytes: 0 %
LYMPHS ABS: 1.1 10*3/uL (ref 0.7–3.1)
Lymphs: 19 %
MCH: 31 pg (ref 26.6–33.0)
MCHC: 32.6 g/dL (ref 31.5–35.7)
MCV: 95 fL (ref 79–97)
MONOCYTES: 9 %
Monocytes Absolute: 0.5 10*3/uL (ref 0.1–0.9)
NEUTROS ABS: 4.1 10*3/uL (ref 1.4–7.0)
Neutrophils: 70 %
Platelets: 140 10*3/uL — ABNORMAL LOW (ref 150–379)
RBC: 3.81 x10E6/uL — AB (ref 4.14–5.80)
RDW: 14.1 % (ref 12.3–15.4)
WBC: 5.9 10*3/uL (ref 3.4–10.8)

## 2016-08-24 LAB — CUP PACEART INCLINIC DEVICE CHECK
Brady Statistic RV Percent Paced: 0.06 %
Date Time Interrogation Session: 20180319101822
HIGH POWER IMPEDANCE MEASURED VALUE: 61.875
Implantable Lead Implant Date: 20140408
Implantable Lead Location: 753859
Lead Channel Impedance Value: 412.5 Ohm
Lead Channel Pacing Threshold Amplitude: 0.5 V
Lead Channel Pacing Threshold Pulse Width: 0.5 ms
Lead Channel Pacing Threshold Pulse Width: 0.5 ms
Lead Channel Sensing Intrinsic Amplitude: 11.7 mV
Lead Channel Sensing Intrinsic Amplitude: 4.2 mV
Lead Channel Setting Pacing Amplitude: 2 V
Lead Channel Setting Pacing Amplitude: 2.5 V
MDC IDC LEAD IMPLANT DT: 20140408
MDC IDC LEAD LOCATION: 753860
MDC IDC MSMT LEADCHNL RA PACING THRESHOLD AMPLITUDE: 0.75 V
MDC IDC MSMT LEADCHNL RA PACING THRESHOLD AMPLITUDE: 0.75 V
MDC IDC MSMT LEADCHNL RA PACING THRESHOLD PULSEWIDTH: 0.5 ms
MDC IDC MSMT LEADCHNL RA PACING THRESHOLD PULSEWIDTH: 0.5 ms
MDC IDC MSMT LEADCHNL RV IMPEDANCE VALUE: 337.5 Ohm
MDC IDC MSMT LEADCHNL RV PACING THRESHOLD AMPLITUDE: 0.5 V
MDC IDC PG IMPLANT DT: 20140408
MDC IDC PG SERIAL: 7094248
MDC IDC SET LEADCHNL RV PACING PULSEWIDTH: 0.5 ms
MDC IDC SET LEADCHNL RV SENSING SENSITIVITY: 0.5 mV
MDC IDC STAT BRADY RA PERCENT PACED: 24 %

## 2016-08-24 LAB — LIPID PANEL
Chol/HDL Ratio: 2.4 ratio units (ref 0.0–5.0)
Cholesterol, Total: 89 mg/dL — ABNORMAL LOW (ref 100–199)
HDL: 37 mg/dL — AB (ref >39)
LDL Calculated: 39 mg/dL (ref 0–99)
TRIGLYCERIDES: 65 mg/dL (ref 0–149)
VLDL Cholesterol Cal: 13 mg/dL (ref 5–40)

## 2016-08-24 LAB — HEPATIC FUNCTION PANEL
ALT: 15 IU/L (ref 0–44)
AST: 21 IU/L (ref 0–40)
Albumin: 4 g/dL (ref 3.5–4.7)
Alkaline Phosphatase: 67 IU/L (ref 39–117)
BILIRUBIN TOTAL: 0.5 mg/dL (ref 0.0–1.2)
BILIRUBIN, DIRECT: 0.18 mg/dL (ref 0.00–0.40)
TOTAL PROTEIN: 6 g/dL (ref 6.0–8.5)

## 2016-08-24 NOTE — Addendum Note (Signed)
Addended by: Eulis Foster on: 08/24/2016 09:31 AM   Modules accepted: Orders

## 2016-08-24 NOTE — Progress Notes (Signed)
Lipid

## 2016-08-24 NOTE — Progress Notes (Signed)
ICD check in clinic. Normal device function. Thresholds and sensing consistent with previous device measurements. Impedance trends stable over time. No evidence of any ventricular arrhythmias. <1% AT/AF burden--max dur.12 sec; AT/AFL per EGMs. Histogram distribution appropriate for patient and level of activity. Stable thoracic impedance. No changes made this session. Device programmed at appropriate safety margins. Device programmed to optimize intrinsic conduction. Estimated longevity 4.3-5.3 years. Pt will follow up with AS on 5/30 @ 0900. Spoke with patient about benefits of remote monitoring and connection options, including splitter for landline connection. Patient states that he would "like some time to think it over", and call back if he decides to proceed. Patient education completed including shock plan. Vibration demonstrated for patient.

## 2016-09-14 ENCOUNTER — Other Ambulatory Visit: Payer: Self-pay | Admitting: Cardiology

## 2016-09-14 NOTE — Telephone Encounter (Signed)
Rx request sent to pharmacy.  

## 2016-11-01 ENCOUNTER — Encounter: Payer: Self-pay | Admitting: Nurse Practitioner

## 2016-11-01 NOTE — Progress Notes (Signed)
Electrophysiology Office Note Date: 11/03/2016  ID:  Louis Reid [birth year], MRN [id number]  PCP: [name], MD Primary Cardiologist: Stanford Breed Electrophysiologist: Allred  CC: Routine ICD follow-up  Louis Reid is a 83 y.o. male seen today for Dr Rayann Heman.  He presents today for routine electrophysiology followup.  Since last being seen in our clinic, the patient reports doing very well. He remains active despite his age. He lives alone but has his son come help him with strenuous yard work.  He denies chest pain, palpitations, dyspnea, PND, orthopnea, nausea, vomiting, dizziness, syncope, edema, weight gain, or early satiety.  He has not had ICD shocks.   Device History: STJ dual chamber ICD implanted 2014 for VF arrest History of appropriate therapy: No History of AAD therapy: No   Past Medical History:  Diagnosis Date  . Anemia   . CAD (coronary artery disease)    a. s/p remote CABG, b. AL STEMI c/b VF arrest in 05/2012 => LHC 05/10/12: Severe 3v CAD, S-OM1/2 ok, SVG-Dx ok, SVG-RCA occluded, LIMA-LAD atretic, proximal LAD occluded. EF 20% with anterior apical HK=> salvage PCI with POBA and thrombectomy of the occluded LAD;  c/b hypoxic encephalopathy  . Cataract   . Chronic systolic heart failure ([place])    a. Echo 12/13: EF 20-25%, anteroseptal, inferior, apical HK, mildly reduced RV function, trivial pericardial effusion;   b.  Echo 3/14:  Ant, septal, apical AK, EF 25%  . Clotting disorder ([place])   . Depression   . HTN (hypertension)   . Inguinal hernia    "have one now on my left side" (09/13/2012)  . Ischemic cardiomyopathy   . Kidney stones   . Osteoporosis   . Paroxysmal atrial fibrillation ([place]) 03/17/2016   noted on device interrogation,  will follow burden  . Pectus excavatum   . Pulmonary embolism ([place])    a. after adhesiolysis for SBO in 04/2012 => coumadin for 6 mos  . SBO (small bowel obstruction) [facility]) November 2013   s/p lysis of adhesions   . Sinoatrial node dysfunction (HCC)   . Ventricular fibrillation ([place]) 05/2012   . in setting of AL STEMI 12/13 => EF 20-25% => d/c on LifeVest (repeat echo planned in 08/2012)   Past Surgical History:  Procedure Laterality Date  . COLONOSCOPY    . CORONARY ARTERY BYPASS GRAFT  1996   "CABG X5" (09/13/2012)  . CYSTOSCOPY/RETROGRADE/URETEROSCOPY/STONE EXTRACTION WITH BASKET  1990's  . ESOPHAGOGASTRODUODENOSCOPY  05/08/2012   Procedure: ESOPHAGOGASTRODUODENOSCOPY (EGD);  Surgeon: Juanita Craver, MD;  Location: [facility] ENDOSCOPY;  Service: Endoscopy;  Laterality: N/A;  Nevin Bloodgood put on for Dr. Collene Mares , Dr. Collene Mares will call if she wants another time  . IMPLANTABLE CARDIOVERTER DEFIBRILLATOR IMPLANT N/A 09/13/2012   SJM Ellipse ER implanted by Dr Rayann Heman for secondary prevention  . INGUINAL HERNIA REPAIR Right 1996  . LAPAROTOMY  04/27/2012   Procedure: EXPLORATORY LAPAROTOMY;  Surgeon: Gwenyth Ober, MD;  Location: [place];  Service: General;  Laterality: N/A;  . LEFT HEART CATHETERIZATION WITH CORONARY ANGIOGRAM N/A 05/10/2012   Procedure: LEFT HEART CATHETERIZATION WITH CORONARY ANGIOGRAM;  Surgeon: Burnell Blanks, MD;  Location[facility] CATH LAB;  Service: Cardiovascular;  Laterality: N/A;  . PTCA     percutaneous transluminal coronary intervention and brachy therapy, Bruce R. Olevia Perches, MD. EF 60%  . TRANSURETHRAL RESECTION OF PROSTATE      Current Outpatient Prescriptions  Medication Sig Dispense Refill  . aspirin EC 81 MG tablet Take  81 mg by mouth daily.    Marland Kitchen atorvastatin (LIPITOR) 80 MG tablet Take 80 mg by mouth daily.    . carvedilol (COREG) 6.25 MG tablet TAKE 1 TABLET BY MOUTH TWO  TIMES DAILY WITH MEALS 180 tablet 3  . cholecalciferol (VITAMIN D) 1000 UNITS tablet Take 1,000 Units by mouth daily.    Marland Kitchen docusate sodium (COLACE) 100 MG capsule Take 100 mg by mouth 2 (two) times daily as needed for constipation.     . furosemide (LASIX) 20 MG tablet TAKE 1 TABLET BY MOUTH  DAILY 90 tablet 3  .  lisinopril (PRINIVIL,ZESTRIL) 2.5 MG tablet TAKE 1 TABLET BY MOUTH  DAILY 90 tablet 3  . Multiple Vitamins-Minerals (ICAPS AREDS 2 PO) Take 1 tablet by mouth daily.    Marland Kitchen spironolactone (ALDACTONE) 25 MG tablet TAKE 1 TABLET BY MOUTH  DAILY 90 tablet 3   No current facility-administered medications for this visit.     Allergies:   Cold medicine plus [[id number]]   Social History: Social History   Social History  . Marital status: Widowed    Spouse name: N/A  . Number of children: 1  . Years of education: N/A   Occupational History  . [name] Retired   Social History Main Topics  . Smoking status: Former Smoker    Types: Cigarettes  . Smokeless tobacco: Never Used     Comment: 09/14/2011 "quit smoking when I was ~ 17; probably didn't smoke 10 packs in my life"  . Alcohol use No  . Drug use: No  . Sexual activity: Not Currently   Other Topics Concern  . Not on file   Social History Narrative  . No narrative on file    Family History: Family History  Problem Relation Age of Onset  . Heart disease Father   . Other Mother        brain tumor  . Colon cancer Brother   . Cancer Brother        Liver  . Heart disease Brother     Review of Systems: All other systems reviewed and are otherwise negative except as noted above.   Physical Exam: VS:  BP 120/84   Pulse 67   Ht 5\' 11"  (1.803 m)   Wt 130 lb 6.4 oz (59.1 kg)   SpO2 98%   BMI 18.19 kg/m  , BMI Body mass index is 18.19 kg/m.  GEN- The patient is elderly and thin appearing, alert and oriented x 3 today.   HEENT: normocephalic, atraumatic; sclera clear, conjunctiva pink; hearing intact; oropharynx clear; neck supple  Lungs- Clear to ausculation bilaterally, normal work of breathing.  No wheezes, rales, rhonchi Heart- Regular rate and rhythm  GI- soft, non-tender, non-distended, bowel sounds present  Extremities- no clubbing, cyanosis, or edema  MS- no significant deformity or  atrophy Skin- warm and dry, no rash or lesion; ICD pocket well healed Psych- euthymic mood, full affect Neuro- strength and sensation are intact  ICD interrogation- reviewed in detail today,  See PACEART report  EKG:  EKG is not ordered today.  Recent Labs: 08/24/2016: ALT 15; BUN 28; Creatinine, Ser 1.25; Platelets 140; Potassium 4.4; Sodium 143   Wt Readings from Last 3 Encounters:  11/03/16 130 lb 6.4 oz (59.1 kg)  06/29/16 130 lb (59 kg)  10/21/15 133 lb 12.8 oz (60.7 kg)     Other studies Reviewed: Additional studies/ records that were reviewed today include: Dr Rayann Heman and Dr Jacalyn Lefevre office notes  Assessment and Plan:  1.  Chronic systolic dysfunction euvolemic today Stable on an appropriate medical regimen Normal ICD function See [facility] report No changes today Unable to do remote follow up 2/2 poor cell reception   2.  Prior VF arrest No recurrent arrhythmias  3.  CAD No recent ischemic symptoms Continue current therapy    Current medicines are reviewed at length with the patient today.   The patient does not have concerns regarding his medicines.  The following changes were made today:  none  Labs/ tests ordered today include:  Orders Placed This Encounter  Procedures  . CUP PACEART INCLINIC DEVICE CHECK     Disposition:   Follow up with device clinic 3 months, Dr Rayann Heman 1 year    Signed, Chanetta Marshall, NP 11/03/2016 9:32 AM  [address] [place] [address] [phone] (office) [phone] (fax)

## 2016-11-02 ENCOUNTER — Other Ambulatory Visit: Payer: Self-pay | Admitting: Cardiology

## 2016-11-02 DIAGNOSIS — I25810 Atherosclerosis of coronary artery bypass graft(s) without angina pectoris: Secondary | ICD-10-CM

## 2016-11-03 ENCOUNTER — Ambulatory Visit (INDEPENDENT_AMBULATORY_CARE_PROVIDER_SITE_OTHER): Payer: Medicare Other | Admitting: Nurse Practitioner

## 2016-11-03 ENCOUNTER — Encounter: Payer: Self-pay | Admitting: Nurse Practitioner

## 2016-11-03 ENCOUNTER — Encounter (INDEPENDENT_AMBULATORY_CARE_PROVIDER_SITE_OTHER): Payer: Self-pay

## 2016-11-03 VITALS — BP 120/84 | HR 67 | Ht 71.0 in | Wt 130.4 lb

## 2016-11-03 DIAGNOSIS — I5022 Chronic systolic (congestive) heart failure: Secondary | ICD-10-CM

## 2016-11-03 DIAGNOSIS — I25810 Atherosclerosis of coronary artery bypass graft(s) without angina pectoris: Secondary | ICD-10-CM | POA: Diagnosis not present

## 2016-11-03 DIAGNOSIS — I469 Cardiac arrest, cause unspecified: Secondary | ICD-10-CM | POA: Diagnosis not present

## 2016-11-03 LAB — CUP PACEART INCLINIC DEVICE CHECK
Implantable Lead Location: 753860
MDC IDC LEAD IMPLANT DT: 20140408
MDC IDC LEAD IMPLANT DT: 20140408
MDC IDC LEAD LOCATION: 753859
MDC IDC PG IMPLANT DT: 20140408
MDC IDC PG SERIAL: 7094248
MDC IDC SESS DTM: 20180529091825

## 2016-11-03 NOTE — Patient Instructions (Addendum)
Medication Instructions:    Your physician recommends that you continue on your current medications as directed. Please refer to the Current Medication list given to you today.  - If you need a refill on your cardiac medications before your next appointment, please call your pharmacy.   Labwork:  None ordered  Testing/Procedures:  None ordered  Follow-Up:  Your physician recommends that you schedule a follow-up appointment in: 3 months with device clinic for a device check.   Your physician wants you to follow-up in: 1 year with Dr. Rayann Heman.  You will receive a reminder letter in the mail two months in advance. If you don't receive a letter, please call our office to schedule the follow-up appointment.  Thank you for choosing [facility]!!

## 2016-11-04 ENCOUNTER — Encounter: Payer: Medicare Other | Admitting: Nurse Practitioner

## 2017-01-04 ENCOUNTER — Telehealth: Payer: Self-pay | Admitting: Cardiology

## 2017-01-04 NOTE — Telephone Encounter (Signed)
Patient does not monitor home BP and/or HR. He states he always gets commended for good BPs at home.   Patient aware a scheduler will contact him to set up an appointment

## 2017-01-04 NOTE — Telephone Encounter (Signed)
Complaints need to be addressed at office visit. Has he recorded his HR and BP? MCr

## 2017-01-04 NOTE — Telephone Encounter (Signed)
Returned call to patient. He states he is weak, tired, no appetite, losing weight. He states his symptoms have been gradually coming on but have worsened here recently. He states he would like to avoid having to go to the hospital - he states he does not want to go to ED unless he goes from our office so he doesn't have to wait around in the ED. He states he has lost 50%+ of his energy. He hasn't weighed lately but is pretty sure he lost weight. His appetite has decreased and taste is not like it should be. He feels "right on the edge" of being sick on his stomach. He thinks he is having issues w/medications - specifically his BP medications. He does not monitor home BP. He thinks he is having side effects from his medications - weakness, loss of appetite, sleepiness. He reports no chest pain, no headaches, no bleeding issues, he sleeps fairly well. He states as long he is resting, his stomach feels OK. He feels nausea when he gets up and moves around. He has not seen his PCP for these symptoms - he thinks he should come to Dr. Stanford Breed first to r/o heart issues before seeking care from PCP. He states he would like to see MD - advised will defer his concerns to Dr. Stanford Breed.   Patient states he would like to make morning appointment if MD recommends he be seen.

## 2017-01-04 NOTE — Telephone Encounter (Signed)
Follow up    Pt is calling to follow up on this. He said he really needs to see someone and would like a call from RN.

## 2017-01-04 NOTE — Telephone Encounter (Signed)
paov Morey Andonian  

## 2017-01-04 NOTE — Telephone Encounter (Signed)
Pt says he needs to be seen today or tomorrow. He is real weak,tired,no appetite,and losing weight.Please call him asap please.

## 2017-01-05 NOTE — Telephone Encounter (Signed)
Spoke with pt, Follow up scheduled  

## 2017-01-05 NOTE — Telephone Encounter (Signed)
Follow up      Pt is calling back to let the nurse know that he needs at least a 3hr notice prior to an appt time because his son has to come get him and bring him to the office.  Pt request to talk to [facility]

## 2017-01-06 ENCOUNTER — Encounter: Payer: Self-pay | Admitting: Cardiology

## 2017-01-06 ENCOUNTER — Ambulatory Visit (INDEPENDENT_AMBULATORY_CARE_PROVIDER_SITE_OTHER): Payer: Medicare Other | Admitting: Cardiology

## 2017-01-06 VITALS — BP 118/74 | HR 98 | Ht 71.0 in | Wt 125.0 lb

## 2017-01-06 DIAGNOSIS — I2510 Atherosclerotic heart disease of native coronary artery without angina pectoris: Secondary | ICD-10-CM

## 2017-01-06 DIAGNOSIS — I10 Essential (primary) hypertension: Secondary | ICD-10-CM

## 2017-01-06 DIAGNOSIS — E7800 Pure hypercholesterolemia, unspecified: Secondary | ICD-10-CM

## 2017-01-06 DIAGNOSIS — R531 Weakness: Secondary | ICD-10-CM

## 2017-01-06 NOTE — Progress Notes (Signed)
HPI: FU coronary artery disease and ischemic cardiomyopathy. Patient is status post coronary artery bypass graft in 1996. Patient had an anterior infarct in December of 7989 complicated by ventricular fibrillation arrest. Cardiac catheterization revealed sequential saphenous vein graft to the OM1 and OM2 patent, saphenous vein graft to diagonal patent, saphenous vein graft to the right coronary occluded and LIMA to the LAD atretic. The proximal LAD was occluded. Ejection fraction 20%. The patient had PCI of his LAD. Course complicated by hypoxic encephalopathy. Echocardiogram repeated in March of 2014. Ejection fraction 25%. He is status post ICD 4/14. Nuclear study 9/15 showed EF 29, scar, no ischemia. ABIs 9/15 normal. Since last seen, patient complains of weakness over the past 3-4 weeks. He has lost approximately 5 pounds. He describes bilateral leg weakness with walking up hills as well as a mild tightness in his upper chest. He does not describe claudication. He denies increased dyspnea or syncope. He has mild nausea.  Current Outpatient Prescriptions  Medication Sig Dispense Refill  . aspirin EC 81 MG tablet Take 81 mg by mouth daily.    Marland Kitchen atorvastatin (LIPITOR) 80 MG tablet Take 80 mg by mouth daily.    . carvedilol (COREG) 6.25 MG tablet TAKE 1 TABLET BY MOUTH TWO  TIMES DAILY WITH MEALS 180 tablet 3  . cholecalciferol (VITAMIN D) 1000 UNITS tablet Take 1,000 Units by mouth daily.    Marland Kitchen docusate sodium (COLACE) 100 MG capsule Take 100 mg by mouth 2 (two) times daily as needed for constipation.     . furosemide (LASIX) 20 MG tablet TAKE 1 TABLET BY MOUTH  DAILY 90 tablet 3  . lisinopril (PRINIVIL,ZESTRIL) 2.5 MG tablet TAKE 1 TABLET BY MOUTH  DAILY 90 tablet 3  . Multiple Vitamins-Minerals (ICAPS AREDS 2 PO) Take 1 tablet by mouth daily.    Marland Kitchen spironolactone (ALDACTONE) 25 MG tablet TAKE 1 TABLET BY MOUTH  DAILY 90 tablet 3   No current facility-administered medications for this visit.       Past Medical History:  Diagnosis Date  . Anemia   . CAD (coronary artery disease)    a. s/p remote CABG, b. AL STEMI c/b VF arrest in 05/2012 => LHC 05/10/12: Severe 3v CAD, S-OM1/2 ok, SVG-Dx ok, SVG-RCA occluded, LIMA-LAD atretic, proximal LAD occluded. EF 20% with anterior apical HK=> salvage PCI with POBA and thrombectomy of the occluded LAD;  c/b hypoxic encephalopathy  . Cataract   . Chronic systolic heart failure ([place])    a. Echo 12/13: EF 20-25%, anteroseptal, inferior, apical HK, mildly reduced RV function, trivial pericardial effusion;   b.  Echo 3/14:  Ant, septal, apical AK, EF 25%  . Clotting disorder ([place])   . Depression   . HTN (hypertension)   . Inguinal hernia    "have one now on my left side" (09/13/2012)  . Ischemic cardiomyopathy   . Kidney stones   . Osteoporosis   . Paroxysmal atrial fibrillation ([place]) 03/17/2016   noted on device interrogation,  will follow burden  . Pectus excavatum   . Pulmonary embolism ([place])    a. after adhesiolysis for SBO in 04/2012 => coumadin for 6 mos  . SBO (small bowel obstruction) [facility]) November 2013   s/p lysis of adhesions  . Sinoatrial node dysfunction (HCC)   . Ventricular fibrillation ([place]) 05/2012   . in setting of AL STEMI 12/13 => EF 20-25% => d/c on LifeVest (repeat echo planned in 08/2012)    Past  Surgical History:  Procedure Laterality Date  . COLONOSCOPY    . CORONARY ARTERY BYPASS GRAFT  1996   "CABG X5" (09/13/2012)  . CYSTOSCOPY/RETROGRADE/URETEROSCOPY/STONE EXTRACTION WITH BASKET  1990's  . ESOPHAGOGASTRODUODENOSCOPY  05/08/2012   Procedure: ESOPHAGOGASTRODUODENOSCOPY (EGD);  Surgeon: Juanita Craver, MD;  Location: [facility] ENDOSCOPY;  Service: Endoscopy;  Laterality: N/A;  Nevin Bloodgood put on for Dr. Collene Mares , Dr. Collene Mares will call if she wants another time  . IMPLANTABLE CARDIOVERTER DEFIBRILLATOR IMPLANT N/A 09/13/2012   SJM Ellipse ER implanted by Dr Rayann Heman for secondary prevention  . INGUINAL HERNIA REPAIR Right 1996  .  LAPAROTOMY  04/27/2012   Procedure: EXPLORATORY LAPAROTOMY;  Surgeon: Gwenyth Ober, MD;  Location: [address];  Service: General;  Laterality: N/A;  . LEFT HEART CATHETERIZATION WITH CORONARY ANGIOGRAM N/A 05/10/2012   Procedure: LEFT HEART CATHETERIZATION WITH CORONARY ANGIOGRAM;  Surgeon: Burnell Blanks, MD;  Location: [facility] CATH LAB;  Service: Cardiovascular;  Laterality: N/A;  . PTCA     percutaneous transluminal coronary intervention and brachy therapy, Bruce R. Olevia Perches, MD. EF 60%  . TRANSURETHRAL RESECTION OF PROSTATE      Social History   Social History  . Marital status: Widowed    Spouse name: N/A  . Number of children: 1  . Years of education: N/A   Occupational History  . [name] Retired   Social History Main Topics  . Smoking status: Former Smoker    Types: Cigarettes  . Smokeless tobacco: Never Used     Comment: 09/14/2011 "quit smoking when I was ~ 17; probably didn't smoke 10 packs in my life"  . Alcohol use No  . Drug use: No  . Sexual activity: Not Currently   Other Topics Concern  . Not on file   Social History Narrative  . No narrative on file    Family History  Problem Relation Age of Onset  . Heart disease Father   . Other Mother        brain tumor  . Colon cancer Brother   . Cancer Brother        Liver  . Heart disease Brother     ROS: Weakness and nausea but no fevers or chills, productive cough, hemoptysis, dysphasia, odynophagia, melena, hematochezia, dysuria, hematuria, rash, seizure activity, orthopnea, PND, pedal edema, claudication. Remaining systems are negative.  Physical Exam: Well-developed well-nourished in no acute distress.  Skin is warm and dry.  HEENT is normal.  Neck is supple.  Chest is clear to auscultation with normal expansion.  Cardiovascular exam is regular rate and rhythm.  Abdominal exam nontender or distended. No masses palpated. Extremities show no edema. neuro grossly intact  ECG-Sinus with  intermittent atrial pacing, anterior and inferior T-wave inversion. personally reviewed  A/P  1 Weakness/weight loss-patient describes weakness, mild nausea, lower extremity weakness with climbing hills and mild upper chest tightness over the past 4 weeks. I am not clear on the etiology. We will check CBC, renal function, liver functions, CK and TSH. I will arrange a [facility] nuclear study to screen for ischemia. He feels some of his symptoms may be medication related. I will hold his Lipitor for now. I have also asked him to follow-up with his primary care physician for further evaluation. I think his symptoms are less likely medication related as he has been on these medications for quite some time.  2 coronary artery disease-continue aspirin and statin.  3 ischemic cardiomyopathy-continue ACE inhibitor and beta blocker.  4 hypertension-blood  pressure is controlled. Continue present medications.  5 hyperlipidemia-continue statin.  6 prior ICD-management per electrophysiology.  Kirk Ruths, MD

## 2017-01-06 NOTE — Patient Instructions (Signed)
Medication Instructions:   STOP ATORVASTATIN  Labwork:  Your physician recommends that you HAVE LAB WORK TODAY  Testing/Procedures:  Your physician has requested that you have a lexiscan myoview. For further information please visit [email or web address]. Please follow instruction sheet, as given.    Follow-Up:  Your physician recommends that you schedule a follow-up appointment in: Center   If you need a refill on your cardiac medications before your next appointment, please call your pharmacy.

## 2017-01-07 ENCOUNTER — Telehealth (HOSPITAL_COMMUNITY): Payer: Self-pay

## 2017-01-07 LAB — CBC
HEMATOCRIT: 37.7 % (ref 37.5–51.0)
Hemoglobin: 12.6 g/dL — ABNORMAL LOW (ref 13.0–17.7)
MCH: 31.8 pg (ref 26.6–33.0)
MCHC: 33.4 g/dL (ref 31.5–35.7)
MCV: 95 fL (ref 79–97)
PLATELETS: 157 10*3/uL (ref 150–379)
RBC: 3.96 x10E6/uL — ABNORMAL LOW (ref 4.14–5.80)
RDW: 14.2 % (ref 12.3–15.4)
WBC: 6.1 10*3/uL (ref 3.4–10.8)

## 2017-01-07 LAB — BASIC METABOLIC PANEL
BUN / CREAT RATIO: 19 (ref 10–24)
BUN: 25 mg/dL (ref 8–27)
CHLORIDE: 103 mmol/L (ref 96–106)
CO2: 23 mmol/L (ref 20–29)
Calcium: 9 mg/dL (ref 8.6–10.2)
Creatinine, Ser: 1.35 mg/dL — ABNORMAL HIGH (ref 0.76–1.27)
GFR calc non Af Amer: 49 mL/min/{1.73_m2} — ABNORMAL LOW (ref >59)
GFR, EST AFRICAN AMERICAN: 57 mL/min/{1.73_m2} — AB (ref >59)
Glucose: 92 mg/dL (ref 65–99)
POTASSIUM: 4.6 mmol/L (ref 3.5–5.2)
SODIUM: 141 mmol/L (ref 134–144)

## 2017-01-07 LAB — TSH: TSH: 4.51 u[IU]/mL — AB (ref 0.450–4.500)

## 2017-01-07 LAB — HEPATIC FUNCTION PANEL
ALT: 29 IU/L (ref 0–44)
AST: 28 IU/L (ref 0–40)
Albumin: 4 g/dL (ref 3.5–4.7)
Alkaline Phosphatase: 66 IU/L (ref 39–117)
Bilirubin Total: 0.5 mg/dL (ref 0.0–1.2)
Bilirubin, Direct: 0.19 mg/dL (ref 0.00–0.40)
Total Protein: 6.1 g/dL (ref 6.0–8.5)

## 2017-01-07 LAB — CK: Total CK: 127 U/L (ref 24–204)

## 2017-01-07 NOTE — Telephone Encounter (Signed)
Encounter complete. 

## 2017-01-12 ENCOUNTER — Ambulatory Visit (HOSPITAL_COMMUNITY)
Admission: RE | Admit: 2017-01-12 | Discharge: 2017-01-12 | Disposition: A | Discharge status: Home / Self Care | Payer: Medicare Other | Source: Ambulatory Visit | Attending: Cardiovascular Disease | Admitting: Cardiovascular Disease

## 2017-01-12 DIAGNOSIS — R531 Weakness: Secondary | ICD-10-CM | POA: Diagnosis not present

## 2017-01-12 DIAGNOSIS — R9439 Abnormal result of other cardiovascular function study: Secondary | ICD-10-CM | POA: Diagnosis not present

## 2017-01-12 DIAGNOSIS — I2510 Atherosclerotic heart disease of native coronary artery without angina pectoris: Secondary | ICD-10-CM | POA: Insufficient documentation

## 2017-01-12 MED ORDER — TECHNETIUM TC 99M TETROFOSMIN IV KIT
10.6000 | PACK | Freq: Once | INTRAVENOUS | Status: AC | PRN
  Administered 2017-01-12: 10.6 via INTRAVENOUS
  Filled 2017-01-12: qty 11

## 2017-01-12 MED ORDER — TECHNETIUM TC 99M TETROFOSMIN IV KIT
32.7000 | PACK | Freq: Once | INTRAVENOUS | Status: AC | PRN
  Administered 2017-01-12: 32.7 via INTRAVENOUS
  Filled 2017-01-12: qty 33

## 2017-01-12 MED ORDER — REGADENOSON 0.4 MG/5ML IV SOLN
0.4000 mg | Freq: Once | INTRAVENOUS | Status: AC
  Administered 2017-01-12: 0.4 mg via INTRAVENOUS

## 2017-01-13 ENCOUNTER — Telehealth: Payer: Self-pay | Admitting: *Deleted

## 2017-01-13 LAB — MYOCARDIAL PERFUSION IMAGING
CHL CUP NUCLEAR SRS: 35
CHL CUP NUCLEAR SSS: 36
LV dias vol: 304 mL (ref 62–150)
LVSYSVOL: 216 mL
NUC STRESS TID: 0.92
Peak HR: 102 {beats}/min
Rest HR: 60 {beats}/min
SDS: 1

## 2017-01-13 NOTE — Telephone Encounter (Addendum)
-----   Message from Lelon Perla, MD sent at 01/13/2017  8:16 AM EDT ----- No changes compared to previous; medical therapy Louis Reid    pt aware of results  He reports that he continues to have weakness. He has been off the statin for about 7-8 days. He reports the nausea and his appetite has gotten a little better but he continues to have dizziness when getting up from seated or lying position. He does not have a way of checking his bp. He thinks the dizziness may have gotten a little worse in the last couple days. Will forward for dr Stanford Breed review

## 2017-01-13 NOTE — Telephone Encounter (Signed)
Spoke with pt, Aware of dr [facility]'s recommendations.  °

## 2017-01-13 NOTE — Telephone Encounter (Signed)
DC spironolactone and fu with primary care Kirk Ruths

## 2017-01-18 ENCOUNTER — Telehealth: Payer: Self-pay | Admitting: Cardiology

## 2017-01-18 NOTE — Telephone Encounter (Signed)
Spoke with pt, he has taken his medications today. Advised to drink plenty of fluids and use orthostatic precautions. He will not take his carvedilol this evening and will not take any bp medications tomorrow morning. He will call and let me know how he is doing. Patient voiced understanding to have someone take him to the ER if he feels worse. Dr Stanford Breed made aware.

## 2017-01-18 NOTE — Telephone Encounter (Signed)
New message      Pt c/o BP issue: STAT if pt c/o blurred vision, one-sided weakness or slurred speech  1. What are your last 5 BP readings?  Aug 9 96/68 pulse 61, aug 10 92/60 pulse 69, aug 11 83/51 pulse 65, this am 74/55 pulse 71 2. Are you having any other symptoms (ex. Dizziness, headache, blurred vision, passed out)?  Nausea, poor appetitie, lightheaded if he stands up too fast, weak  3. What is your BP issue?  Pt is too low.  Pt feels like he may need to be in the hosp

## 2017-01-19 ENCOUNTER — Telehealth: Payer: Self-pay | Admitting: *Deleted

## 2017-01-19 ENCOUNTER — Inpatient Hospital Stay (HOSPITAL_COMMUNITY)
Admission: EM | Admit: 2017-01-19 | Discharge: 2017-01-22 | DRG: 287 | Disposition: A | Discharge status: Home / Self Care | Payer: Medicare Other | Attending: Internal Medicine | Admitting: Internal Medicine

## 2017-01-19 ENCOUNTER — Encounter (HOSPITAL_COMMUNITY): Payer: Self-pay | Admitting: Emergency Medicine

## 2017-01-19 ENCOUNTER — Emergency Department (HOSPITAL_COMMUNITY): Payer: Medicare Other

## 2017-01-19 DIAGNOSIS — N183 Chronic kidney disease, stage 3 (moderate): Secondary | ICD-10-CM | POA: Diagnosis present

## 2017-01-19 DIAGNOSIS — I200 Unstable angina: Secondary | ICD-10-CM | POA: Diagnosis not present

## 2017-01-19 DIAGNOSIS — I25110 Atherosclerotic heart disease of native coronary artery with unstable angina pectoris: Secondary | ICD-10-CM | POA: Diagnosis present

## 2017-01-19 DIAGNOSIS — Z681 Body mass index (BMI) 19 or less, adult: Secondary | ICD-10-CM | POA: Diagnosis not present

## 2017-01-19 DIAGNOSIS — Z87891 Personal history of nicotine dependence: Secondary | ICD-10-CM | POA: Diagnosis not present

## 2017-01-19 DIAGNOSIS — M810 Age-related osteoporosis without current pathological fracture: Secondary | ICD-10-CM | POA: Diagnosis present

## 2017-01-19 DIAGNOSIS — I252 Old myocardial infarction: Secondary | ICD-10-CM

## 2017-01-19 DIAGNOSIS — F329 Major depressive disorder, single episode, unspecified: Secondary | ICD-10-CM | POA: Diagnosis present

## 2017-01-19 DIAGNOSIS — E785 Hyperlipidemia, unspecified: Secondary | ICD-10-CM | POA: Diagnosis not present

## 2017-01-19 DIAGNOSIS — I255 Ischemic cardiomyopathy: Secondary | ICD-10-CM | POA: Diagnosis present

## 2017-01-19 DIAGNOSIS — Z95810 Presence of automatic (implantable) cardiac defibrillator: Secondary | ICD-10-CM | POA: Diagnosis not present

## 2017-01-19 DIAGNOSIS — I480 Paroxysmal atrial fibrillation: Secondary | ICD-10-CM | POA: Diagnosis present

## 2017-01-19 DIAGNOSIS — I130 Hypertensive heart and chronic kidney disease with heart failure and stage 1 through stage 4 chronic kidney disease, or unspecified chronic kidney disease: Secondary | ICD-10-CM | POA: Diagnosis present

## 2017-01-19 DIAGNOSIS — R636 Underweight: Secondary | ICD-10-CM | POA: Diagnosis present

## 2017-01-19 DIAGNOSIS — Z951 Presence of aortocoronary bypass graft: Secondary | ICD-10-CM

## 2017-01-19 DIAGNOSIS — R079 Chest pain, unspecified: Secondary | ICD-10-CM

## 2017-01-19 DIAGNOSIS — I5042 Chronic combined systolic (congestive) and diastolic (congestive) heart failure: Secondary | ICD-10-CM | POA: Diagnosis present

## 2017-01-19 DIAGNOSIS — I959 Hypotension, unspecified: Secondary | ICD-10-CM | POA: Diagnosis present

## 2017-01-19 DIAGNOSIS — I5022 Chronic systolic (congestive) heart failure: Secondary | ICD-10-CM

## 2017-01-19 DIAGNOSIS — Z86711 Personal history of pulmonary embolism: Secondary | ICD-10-CM | POA: Diagnosis not present

## 2017-01-19 DIAGNOSIS — Z955 Presence of coronary angioplasty implant and graft: Secondary | ICD-10-CM | POA: Diagnosis not present

## 2017-01-19 DIAGNOSIS — I9589 Other hypotension: Secondary | ICD-10-CM | POA: Diagnosis not present

## 2017-01-19 LAB — BASIC METABOLIC PANEL
Anion gap: 7 (ref 5–15)
BUN: 32 mg/dL — ABNORMAL HIGH (ref 6–20)
CHLORIDE: 106 mmol/L (ref 101–111)
CO2: 23 mmol/L (ref 22–32)
CREATININE: 1.28 mg/dL — AB (ref 0.61–1.24)
Calcium: 9.1 mg/dL (ref 8.9–10.3)
GFR, EST AFRICAN AMERICAN: 59 mL/min — AB (ref >60)
GFR, EST NON AFRICAN AMERICAN: 51 mL/min — AB (ref >60)
Glucose, Bld: 96 mg/dL (ref 65–99)
POTASSIUM: 4.1 mmol/L (ref 3.5–5.1)
SODIUM: 136 mmol/L (ref 135–145)

## 2017-01-19 LAB — CBC
HCT: 36.1 % — ABNORMAL LOW (ref 39.0–52.0)
Hemoglobin: 11.7 g/dL — ABNORMAL LOW (ref 13.0–17.0)
MCH: 30.7 pg (ref 26.0–34.0)
MCHC: 32.4 g/dL (ref 30.0–36.0)
MCV: 94.8 fL (ref 78.0–100.0)
Platelets: 146 10*3/uL — ABNORMAL LOW (ref 150–400)
RBC: 3.81 MIL/uL — AB (ref 4.22–5.81)
RDW: 14.7 % (ref 11.5–15.5)
WBC: 5.5 10*3/uL (ref 4.0–10.5)

## 2017-01-19 LAB — I-STAT TROPONIN, ED: Troponin i, poc: 0 ng/mL (ref 0.00–0.08)

## 2017-01-19 MED ORDER — HEPARIN BOLUS VIA INFUSION
2000.0000 [IU] | Freq: Once | INTRAVENOUS | Status: AC
  Administered 2017-01-19: 2000 [IU] via INTRAVENOUS
  Filled 2017-01-19: qty 2000

## 2017-01-19 MED ORDER — HEPARIN (PORCINE) IN NACL 100-0.45 UNIT/ML-% IJ SOLN
650.0000 [IU]/h | INTRAMUSCULAR | Status: DC
  Administered 2017-01-19: 650 [IU]/h via INTRAVENOUS
  Filled 2017-01-19: qty 250

## 2017-01-19 NOTE — ED Notes (Signed)
Pt given [place] sandwich and coca cola prior to midnight NPO order.

## 2017-01-19 NOTE — ED Notes (Signed)
Cards @ bedside speaking with pt

## 2017-01-19 NOTE — ED Provider Notes (Signed)
[facility] DEPT Provider Note   CSN: [id number] Arrival date & time: 01/19/17  1814     History   Chief Complaint Chief Complaint  Patient presents with  . Chest Pain    HPI KVON MCILHENNY is a 83 y.o. male.   Chest Pain   This is a new problem. The current episode started more than 1 week ago. The problem occurs daily. The problem has not changed since onset.The pain is associated with exertion. The pain is present in the substernal region. The pain is moderate. The quality of the pain is described as brief and pressure-like. The pain does not radiate. Associated symptoms include exertional chest pressure and shortness of breath. Pertinent negatives include no abdominal pain. He has tried nothing for the symptoms.    Past Medical History:  Diagnosis Date  . Anemia   . CAD (coronary artery disease)    a. s/p remote CABG, b. AL STEMI c/b VF arrest in 05/2012 => LHC 05/10/12: Severe 3v CAD, S-OM1/2 ok, SVG-Dx ok, SVG-RCA occluded, LIMA-LAD atretic, proximal LAD occluded. EF 20% with anterior apical HK=> salvage PCI with POBA and thrombectomy of the occluded LAD;  c/b hypoxic encephalopathy  . Cataract   . Chronic systolic heart failure ([place])    a. Echo 12/13: EF 20-25%, anteroseptal, inferior, apical HK, mildly reduced RV function, trivial pericardial effusion;   b.  Echo 3/14:  Ant, septal, apical AK, EF 25%  . Clotting disorder ([place])   . Depression   . HTN (hypertension)   . Inguinal hernia    "have one now on my left side" (09/13/2012)  . Ischemic cardiomyopathy   . Kidney stones   . Osteoporosis   . Paroxysmal atrial fibrillation ([place]) 03/17/2016   noted on device interrogation,  will follow burden  . Pectus excavatum   . Pulmonary embolism ([place])    a. after adhesiolysis for SBO in 04/2012 => coumadin for 6 mos  . SBO (small bowel obstruction) [facility]) November 2013   s/p lysis of adhesions  . Sinoatrial node dysfunction (HCC)   . Ventricular fibrillation ([place]) 05/2012     . in setting of AL STEMI 12/13 => EF 20-25% => d/c on LifeVest (repeat echo planned in 08/2012)    Patient Active Problem List   Diagnosis Date Noted  . Unstable angina ([place]) 01/19/2017  . Claudication ([place]) 01/22/2014  . Chronic kidney disease 12/26/2013  . ICD (implantable cardioverter-defibrillator) in place 02/16/2013  . Ischemic cardiomyopathy 09/09/2012  . Postop check 07/05/2012  . STEMI (ST elevation myocardial infarction) ([place]) 05/18/2012  . Acute systolic congestive heart failure, NYHA class 3/EF 20-25% 05/17/2012  . Anemia in chronic kidney disease(285.21) 05/17/2012  . PE (pulmonary embolism) 05/17/2012  . Physical deconditioning 05/17/2012  . Anorexia 05/17/2012  . Cardiogenic shock ([place]) 05/16/2012  . Hypotension 05/16/2012  . Ventricular fibrillation arrest 05/10/2012  . Cardiac arrest ([place]) 05/10/2012  . Acute respiratory failure with hypoxia 2/2 cardiac arrest 05/10/2012  . Acute encephalopathy 2/2 cardiac arrest 05/10/2012  . Hyperglycemia 05/10/2012  . Odynophagia 05/07/2012  . Coronary artery disease 07/09/2011  . History of coronary artery bypass surgery 07/09/2011  . DYSPNEA 06/04/2010  . HYPERLIPIDEMIA 04/17/2009  . Essential hypertension 04/17/2009  . Asymptomatic old MI (myocardial infarction) 04/17/2009  . TOBACCO USE, QUIT 04/17/2009    Past Surgical History:  Procedure Laterality Date  . COLONOSCOPY    . CORONARY ARTERY BYPASS GRAFT  1996   "CABG X5" (09/13/2012)  . CYSTOSCOPY/RETROGRADE/URETEROSCOPY/STONE EXTRACTION WITH BASKET  1990's  . ESOPHAGOGASTRODUODENOSCOPY  05/08/2012   Procedure: ESOPHAGOGASTRODUODENOSCOPY (EGD);  Surgeon: Juanita Craver, MD;  Location: [facility] ENDOSCOPY;  Service: Endoscopy;  Laterality: N/A;  Nevin Bloodgood put on for Dr. Collene Mares , Dr. Collene Mares will call if she wants another time  . IMPLANTABLE CARDIOVERTER DEFIBRILLATOR IMPLANT N/A 09/13/2012   SJM Ellipse ER implanted by Dr Rayann Heman for secondary prevention  . INGUINAL HERNIA REPAIR Right  1996  . LAPAROTOMY  04/27/2012   Procedure: EXPLORATORY LAPAROTOMY;  Surgeon: Gwenyth Ober, MD;  Location: [facility];  Service: General;  Laterality: N/A;  . LEFT HEART CATHETERIZATION WITH CORONARY ANGIOGRAM N/A 05/10/2012   Procedure: LEFT HEART CATHETERIZATION WITH CORONARY ANGIOGRAM;  Surgeon: Burnell Blanks, MD;  Location: [facility] CATH LAB;  Service: Cardiovascular;  Laterality: N/A;  . PTCA     percutaneous transluminal coronary intervention and brachy therapy, Bruce R. Olevia Perches, MD. EF 60%  . TRANSURETHRAL RESECTION OF PROSTATE         Home Medications    Prior to Admission medications   Medication Sig Start Date End Date Taking? Authorizing Provider  aspirin EC 81 MG tablet Take 81 mg by mouth daily.   Yes [provider]  cholecalciferol (VITAMIN D) 1000 UNITS tablet Take 1,000 Units by mouth daily.   Yes [provider]  docusate sodium (COLACE) 100 MG capsule Take 100 mg by mouth 2 (two) times daily as needed for constipation.    Yes [provider]  Multiple Vitamins-Minerals (ICAPS AREDS 2 PO) Take 1 tablet by mouth daily.   Yes [provider]  carvedilol (COREG) 6.25 MG tablet TAKE 1 TABLET BY MOUTH TWO  TIMES DAILY WITH MEALS Patient not taking: Reported on 01/19/2017 05/26/16   Lelon Perla, MD  furosemide (LASIX) 20 MG tablet TAKE 1 TABLET BY MOUTH  DAILY Patient not taking: Reported on 01/19/2017 09/14/16   Lelon Perla, MD  lisinopril (PRINIVIL,ZESTRIL) 2.5 MG tablet TAKE 1 TABLET BY MOUTH  DAILY Patient not taking: Reported on 01/19/2017 09/14/16   Lelon Perla, MD    Family History Family History  Problem Relation Age of Onset  . Heart disease Father   . Other Mother        brain tumor  . Colon cancer Brother   . Cancer Brother        Liver  . Heart disease Brother     Social History Social History  Substance Use Topics  . Smoking status: Former Smoker    Types: Cigarettes  . Smokeless tobacco: Never Used      Comment: 09/14/2011 "quit smoking when I was ~ 17; probably didn't smoke 10 packs in my life"  . Alcohol use No     Allergies   Cold medicine plus [chlorphen-pseudoephed-apap]   Review of Systems Review of Systems  Respiratory: Positive for shortness of breath.   Cardiovascular: Positive for chest pain.  Gastrointestinal: Negative for abdominal pain.  All other systems reviewed and are negative.    Physical Exam Updated Vital Signs BP 113/70   Pulse (!) 58   Temp 98 F (36.7 C) (Oral)   Resp 16   Ht 5\' 11"  (1.803 m)   Wt 56.7 kg (125 lb)   SpO2 98%   BMI 17.43 kg/m   Physical Exam  Constitutional: He is oriented to person, place, and time. He appears well-developed and well-nourished.  HENT:  Head: Normocephalic and atraumatic.  Eyes: Conjunctivae and EOM are normal.  Neck: Normal range of motion.  Cardiovascular: Normal rate.   Pulmonary/Chest: Effort normal. No respiratory distress.  Abdominal: Soft. He exhibits no distension.  Musculoskeletal: Normal range of motion.  Neurological: He is alert and oriented to person, place, and time.  Skin: Skin is warm and dry.  Nursing note and vitals reviewed.    ED Treatments / Results  Labs (all labs ordered are listed, but only abnormal results are displayed) Labs Reviewed  BASIC METABOLIC PANEL - Abnormal; Notable for the following:       Result Value   BUN 32 (*)    Creatinine, Ser 1.28 (*)    GFR calc non Af Amer 51 (*)    GFR calc Af Amer 59 (*)    All other components within normal limits  CBC - Abnormal; Notable for the following:    RBC 3.81 (*)    Hemoglobin 11.7 (*)    HCT 36.1 (*)    Platelets 146 (*)    All other components within normal limits  I-STAT TROPONIN, ED    EKG  EKG Interpretation  Date/Time:  Tuesday January 19 2017 18:20:51 EDT Ventricular Rate:  78 PR Interval:  204 QRS Duration: 80 QT Interval:  374 QTC Calculation: 426 R Axis:   -56 Text Interpretation:  Sinus rhythm with  marked sinus arrhythmia Possible Left atrial enlargement Left axis deviation RSR' or QR pattern in V1 suggests right ventricular conduction delay T wave abnormality, consider anterolateral ischemia Abnormal ECG improved ST changes in V2-3, otherwise no changes compared to previously Confirmed by Merrily Pew [phone]) on 01/19/2017 9:09:58 PM       Radiology Dg Chest 2 View  Result Date: 01/19/2017 CLINICAL DATA:  Chest pain EXAM: CHEST  2 VIEW COMPARISON:  09/14/2012 FINDINGS: Left-sided pacing device. Post sternotomy changes. Hyperinflation without focal infiltrate or effusion. Mild cardiomegaly with atherosclerosis. Probable apical scarring on the right. No definitive pneumothorax. Osteopenia of the spine with stable mild compression deformities of the mid to lower thoracic spine. IMPRESSION: Hyperinflation without acute infiltrate.  Cardiomegaly. Electronically Signed   By: Donavan Foil M.D.   On: 01/19/2017 18:59    Procedures Procedures (including critical care time)  Medications Ordered in ED Medications - No data to display   Initial Impression / Assessment and Plan / ED Course  I have reviewed the triage vital signs and the nursing notes.  Pertinent labs & imaging results that were available during my care of the patient were reviewed by me and considered in my medical decision making (see chart for details).     Concern for worsening heart failure vs unstable angina, will d/w cardiology for admission for further management.   Final Clinical Impressions(s) / ED Diagnoses   Final diagnoses:  Nonspecific chest pain      Colby Catanese, Corene Cornea, MD 01/19/17 2320

## 2017-01-19 NOTE — ED Triage Notes (Signed)
Pt sts not feeling well x several weeks; pt sts some chest tightness and generalized weakness; pt feels his BP is low at times

## 2017-01-19 NOTE — Telephone Encounter (Signed)
Patient called to let me know that he continues to be weak, nauseated and he has a heavy pressure in his chest. He will have an occ chill and his appetite his still fair. His bp 94/62 with pulse 66. He feels he needs to be in the hospital and he has called his son to come and get him. He will go to [place].

## 2017-01-19 NOTE — Progress Notes (Signed)
ANTICOAGULATION CONSULT NOTE - Initial Consult  Pharmacy Consult for heparin Indication: chest pain/ACS  Allergies  Allergen Reactions  . Cold Medicine Plus [Chlorphen-Pseudoephed-Apap]     Reaction a long time ago - made him feel goofy    Patient Measurements: Height: 5\' 11"  (180.3 cm) Weight: 125 lb (56.7 kg) IBW/kg (Calculated) : 75.3  Vital Signs: Temp: 98 F (36.7 C) (08/14 2013) Temp Source: Oral (08/14 2013) BP: 113/70 (08/14 2300) Pulse Rate: 58 (08/14 2300)  Labs:  Recent Labs  01/19/17 1825  HGB 11.7*  HCT 36.1*  PLT 146*  CREATININE 1.28*    Estimated Creatinine Clearance: 36.3 mL/min (A) (by C-G formula based on SCr of 1.28 mg/dL (H)).   Medical History: Past Medical History:  Diagnosis Date  . Anemia   . CAD (coronary artery disease)    a. s/p remote CABG, b. AL STEMI c/b VF arrest in 05/2012 => LHC 05/10/12: Severe 3v CAD, S-OM1/2 ok, SVG-Dx ok, SVG-RCA occluded, LIMA-LAD atretic, proximal LAD occluded. EF 20% with anterior apical HK=> salvage PCI with POBA and thrombectomy of the occluded LAD;  c/b hypoxic encephalopathy  . Cataract   . Chronic systolic heart failure ([place])    a. Echo 12/13: EF 20-25%, anteroseptal, inferior, apical HK, mildly reduced RV function, trivial pericardial effusion;   b.  Echo 3/14:  Ant, septal, apical AK, EF 25%  . Clotting disorder ([place])   . Depression   . HTN (hypertension)   . Inguinal hernia    "have one now on my left side" (09/13/2012)  . Ischemic cardiomyopathy   . Kidney stones   . Osteoporosis   . Paroxysmal atrial fibrillation ([place]) 03/17/2016   noted on device interrogation,  will follow burden  . Pectus excavatum   . Pulmonary embolism ([place])    a. after adhesiolysis for SBO in 04/2012 => coumadin for 6 mos  . SBO (small bowel obstruction) [facility]) November 2013   s/p lysis of adhesions  . Sinoatrial node dysfunction (HCC)   . Ventricular fibrillation ([place]) 05/2012   . in setting of AL STEMI 12/13 => EF  20-25% => d/c on LifeVest (repeat echo planned in 08/2012)    Assessment: 83yo male c/o chest tightness and generalized weakness, istat troponin negative, concern for USAP, to begin heparin.  Goal of Therapy:  Heparin level 0.3-0.7 units/ml Monitor platelets by anticoagulation protocol: Yes   Plan:  Will give heparin 2000 units IV bolus x1 followed by gtt at 650 units/hr and monitor heparin levels and CBC.  Wynona Neat, PharmD, BCPS  01/19/2017,11:20 PM

## 2017-01-19 NOTE — H&P (Signed)
CARDIOLOGY HISTORY AND PHYSICAL     Date: 01/19/2017 Admitting Physician: Baruch Merl, MD, PhD  Chief Complaint:  Chest pressure and weakness ____________________________________________________________________ History of Present Illness:  Louis Reid is a 83 y.o. old male with medical history noted below who presents with complaints of 3-4 week history of weakness, progressive fatigue, hypotension, and chest pressure with exertion.  His cardiac history is significant for CABG in 1996, STEMI in 2013 with prox LAD occlusion and atretic LIMA.  He's been followed by Dr Stanford Breed who has been reducing his medications secondary to his hypotension at the patient is convinced this is the cause of his feeling poorly.  In regards to his hypotension he states he's had periods of lightheadedness and dizziness at home particularly with standing.  No frank syncope.  BP readings at home with lows of 74/55 with a pulse of 71.  He states since his medications have been discontinued this is somewhat better.  His chest pressure he rates a 5/10 at it's peak and is associated with exertion.  He denies dyspnea during this chest pressure.  Dr Stanford Breed has stopped his Lasix and Spironolactone, he has elected to stop taking his coreg and lisinopril.  Recent stress perfusion study demonstrated severe intensity fixed perfusion defect of the anterior, septal, apical and inferioapical walls suggestive of extensive scar.  No reversible ischemia.  This is grossly unchanged from his study in 2015.  His exam is grossly unremarkable.  His heart failure profile is warm and dry.  No evidence of acute heart failure on exam.  Some weight loss, however, he lives at home alone and states he doesn't eat much.    Review of Systems:   Review of Systems:  GEN: no fever, chills, +nausea, vomiting, +weight change  HEENT: no vision or hearing changes  PULM: no coughing, SOB  CV: no +chest pain, palpitations, PND, orthopnea  GI: no  +abdominal pain  GU: no dysuria  EXT: no swelling  SKIN: no rashes  NEURO: no numbness or tingling  HEME: no bleeding or bruising  GYN: none  --12 point review systems- otherwise negative.  All other systems reviewed and are negative  Past Medical History:  Diagnosis Date  . Anemia   . CAD (coronary artery disease)    a. s/p remote CABG, b. AL STEMI c/b VF arrest in 05/2012 => LHC 05/10/12: Severe 3v CAD, S-OM1/2 ok, SVG-Dx ok, SVG-RCA occluded, LIMA-LAD atretic, proximal LAD occluded. EF 20% with anterior apical HK=> salvage PCI with POBA and thrombectomy of the occluded LAD;  c/b hypoxic encephalopathy  . Cataract   . Chronic systolic heart failure ([place])    a. Echo 12/13: EF 20-25%, anteroseptal, inferior, apical HK, mildly reduced RV function, trivial pericardial effusion;   b.  Echo 3/14:  Ant, septal, apical AK, EF 25%  . Clotting disorder ([place])   . Depression   . HTN (hypertension)   . Inguinal hernia    "have one now on my left side" (09/13/2012)  . Ischemic cardiomyopathy   . Kidney stones   . Osteoporosis   . Paroxysmal atrial fibrillation ([place]) 03/17/2016   noted on device interrogation,  will follow burden  . Pectus excavatum   . Pulmonary embolism ([place])    a. after adhesiolysis for SBO in 04/2012 => coumadin for 6 mos  . SBO (small bowel obstruction) [facility]) November 2013   s/p lysis of adhesions  . Sinoatrial node dysfunction (HCC)   . Ventricular fibrillation ([place]) 05/2012   .  in setting of AL STEMI 12/13 => EF 20-25% => d/c on LifeVest (repeat echo planned in 08/2012)    Past Surgical History:  Procedure Laterality Date  . COLONOSCOPY    . CORONARY ARTERY BYPASS GRAFT  1996   "CABG X5" (09/13/2012)  . CYSTOSCOPY/RETROGRADE/URETEROSCOPY/STONE EXTRACTION WITH BASKET  1990's  . ESOPHAGOGASTRODUODENOSCOPY  05/08/2012   Procedure: ESOPHAGOGASTRODUODENOSCOPY (EGD);  Surgeon: Juanita Craver, MD;  Location[facility] ENDOSCOPY;  Service: Endoscopy;  Laterality: N/A;  Nevin Bloodgood put on  for Dr. Collene Mares , Dr. Collene Mares will call if she wants another time  . IMPLANTABLE CARDIOVERTER DEFIBRILLATOR IMPLANT N/A 09/13/2012   SJM Ellipse ER implanted by Dr Rayann Heman for secondary prevention  . INGUINAL HERNIA REPAIR Right 1996  . LAPAROTOMY  04/27/2012   Procedure: EXPLORATORY LAPAROTOMY;  Surgeon: Gwenyth Ober, MD;  Location: [facility];  Service: General;  Laterality: N/A;  . LEFT HEART CATHETERIZATION WITH CORONARY ANGIOGRAM N/A 05/10/2012   Procedure: LEFT HEART CATHETERIZATION WITH CORONARY ANGIOGRAM;  Surgeon: Burnell Blanks, MD;  Location: [facility] CATH LAB;  Service: Cardiovascular;  Laterality: N/A;  . PTCA     percutaneous transluminal coronary intervention and brachy therapy, Bruce R. Olevia Perches, MD. EF 60%  . TRANSURETHRAL RESECTION OF PROSTATE      Social History   Social History  . Marital status: Widowed    Spouse name: N/A  . Number of children: 1  . Years of education: N/A   Occupational History  . [name] Retired   Social History Main Topics  . Smoking status: Former Smoker    Types: Cigarettes  . Smokeless tobacco: Never Used     Comment: 09/14/2011 "quit smoking when I was ~ 17; probably didn't smoke 10 packs in my life"  . Alcohol use No  . Drug use: No  . Sexual activity: Not Currently   Other Topics Concern  . Not on file   Social History Narrative  . No narrative on file    Family History  Problem Relation Age of Onset  . Heart disease Father   . Other Mother        brain tumor  . Colon cancer Brother   . Cancer Brother        Liver  . Heart disease Brother     Past Cardiovascular History:  +CAD +MI +CHF - No documented h/o PVD - No documented h/o AAA - No documented h/o valvular heart disease - No documented h/o CVA - No documented h/o Arrhythmias - No documented h/o A-fib  - No documented h/o congenital heart disease +CABG +PCI +cardiac devices (Pacer/ICD/CRT) - No documented h/o cardiac surgery       Most recent stress  test:   01/12/17 Large size (52% of myocardium), severe intensity fixed perfusion defect of the anterior, septal, apical and inferoapical walls suggestive of extensive scar. No significant reversible ischemia. LVEF 29% with akinesis of the mid to distal anterior, anteroseptal, apical and inferoapical walls. This is a high risk study.  Most recent echocardiography:   08/18/12 - Left ventricle: Anterior wall, septum and apex akinetic The cavity size was severely dilated. Wall thickness was normal. The estimated ejection fraction was 25%. - Atrial septum: No defect or patent foramen ovale was identified.  Most recent left heart catheterization:   05/10/12 Graft Anatomy:  SVG sequential to OM1/OM2 is patent with mild irregularities in the body of the graft and a prominent valve mid body of graft but no obstructive lesions. SVG to diagonal is  patent with mild irregularities in the mid body of the graft. SVG to RCA is known to be occluded and was not engaged. LIMA to mid LAD is known to be atretic and was not engaged.  1. 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 2. Salvage PCI with balloon angioplasty and thrombectomy of the occluded LAD 3. Acute anterior STEMI secondary to occluded LAD 4. Cardiac arrest, out of hospital.  5. Severe LV systolic dysfunction  CABG:  Date/ Physician:  1996 SVG -->sequential OM1/OM2 SVG-->D1 SVG-->RCA LIMA-->LAD  Device history:   St Jude Dual chamber ICD  Prior to Admission medications   Medication Sig Start Date End Date Taking? Authorizing Provider  aspirin EC 81 MG tablet Take 81 mg by mouth daily.   Yes [provider]  cholecalciferol (VITAMIN D) 1000 UNITS tablet Take 1,000 Units by mouth daily.   Yes [provider]  docusate sodium (COLACE) 100 MG capsule Take 100 mg by mouth 2 (two) times daily as needed for constipation.    Yes  [provider]  Multiple Vitamins-Minerals (ICAPS AREDS 2 PO) Take 1 tablet by mouth daily.   Yes [provider]  carvedilol (COREG) 6.25 MG tablet TAKE 1 TABLET BY MOUTH TWO  TIMES DAILY WITH MEALS Patient not taking: Reported on 01/19/2017 05/26/16   Lelon Perla, MD  furosemide (LASIX) 20 MG tablet TAKE 1 TABLET BY MOUTH  DAILY Patient not taking: Reported on 01/19/2017 09/14/16   Lelon Perla, MD  lisinopril (PRINIVIL,ZESTRIL) 2.5 MG tablet TAKE 1 TABLET BY MOUTH  DAILY Patient not taking: Reported on 01/19/2017 09/14/16   Lelon Perla, MD    Allergies  Allergen Reactions  . Cold Medicine Plus [Chlorphen-Pseudoephed-Apap]     Reaction a long time ago - made him feel goofy    Social History:   Social History  Substance Use Topics  . Smoking status: Former Smoker    Types: Cigarettes  . Smokeless tobacco: Never Used     Comment: 09/14/2011 "quit smoking when I was ~ 17; probably didn't smoke 10 packs in my life"  . Alcohol use No    Family History  Problem Relation Age of Onset  . Heart disease Father   . Other Mother        brain tumor  . Colon cancer Brother   . Cancer Brother        Liver  . Heart disease Brother     Physical Examination: Blood pressure 113/70, pulse (!) 58, temperature 98 F (36.7 C), temperature source Oral, resp. rate 16, height 5\' 11"  (1.803 m), weight 56.7 kg (125 lb), SpO2 98 %. General:  AAOX 4.  NAD.  NRD. Cachectic. HENT: Normocephalic. Atraumatic.  No acute abnom. EYES: PERRL EOMI  Neck: Supple.  -JVD.  No bruits. Cardiovascular:  Nl S1. Nl S2. No S3. No S4. Nl PMI. No m/r/c. RRR  Pulmonary/Chest: CTA B. No rales. No wheezing. Pectus Excavatum Abdomen: Soft, NT, no masses, no organomegaly.  Left inguinal hernia Neuro: CN intact, no motor/sensory deficit.  Ext: Warm. Trace lower extremity edema edema.  SKIN- intact  No intake or output data in the 24 hours ending 01/19/17 2342  Troponin (Point of Care  Test)  Recent Labs  01/19/17 1836  TROPIPOC 0.00   ____________________________________________________________________ Assessment/Plan  Unstable Angina   Assessment:  Patient with multiple complaints however what has him the most concerned is  this chest pressure he has with exertion.  This seems to come on with exertion and is relieved with rest.  Recent stress test does not demonstrate reversible ischemia.  Cardiac enzymes negative x 1 and ECG is unremarkable for ischemia.  Will treat as unstable angina for now.   Plan  -  Heparin drip  -  ASA  -  ECHO  -  NPO after midnight  -  Will defer to the day team regarding cath  -  If cath is deferred could consider Ranexa for angina treatment  Severely reduced systolic function   Assessment:  Given medications are being withdrawn due to blood pressure intolerance, his symptoms are possibly due to progressive of his heart disease.  This obviously does not confirm a good prognosis.  Having said that, he is certainly not in acute heart failure today given his clear CXR, his warm extremities, and lack of edema.     Plan  -  Repeat ECHO  -  Will hold his CHF medications for now.  Would elect to restart his lisinopril first and gradually reintroduce the beta blockade  Thank you for consulting cardiology.    Electronically signed by Lowella Dandy 01/19/2017 Baruch Merl, MD, PhD Cardiology

## 2017-01-20 ENCOUNTER — Inpatient Hospital Stay (HOSPITAL_COMMUNITY): Payer: Medicare Other

## 2017-01-20 ENCOUNTER — Inpatient Hospital Stay (HOSPITAL_COMMUNITY)
Admission: EM | Disposition: A | Discharge status: Home / Self Care | Payer: Self-pay | Source: Home / Self Care | Attending: Internal Medicine

## 2017-01-20 DIAGNOSIS — E785 Hyperlipidemia, unspecified: Secondary | ICD-10-CM

## 2017-01-20 DIAGNOSIS — I25110 Atherosclerotic heart disease of native coronary artery with unstable angina pectoris: Principal | ICD-10-CM

## 2017-01-20 HISTORY — PX: LEFT HEART CATH AND CORS/GRAFTS ANGIOGRAPHY: CATH118250

## 2017-01-20 HISTORY — PX: INTRAVASCULAR PRESSURE WIRE/FFR STUDY: CATH118243

## 2017-01-20 LAB — BASIC METABOLIC PANEL
Anion gap: 7 (ref 5–15)
BUN: 29 mg/dL — AB (ref 6–20)
CHLORIDE: 109 mmol/L (ref 101–111)
CO2: 26 mmol/L (ref 22–32)
CREATININE: 1.28 mg/dL — AB (ref 0.61–1.24)
Calcium: 8.7 mg/dL — ABNORMAL LOW (ref 8.9–10.3)
GFR, EST AFRICAN AMERICAN: 59 mL/min — AB (ref >60)
GFR, EST NON AFRICAN AMERICAN: 51 mL/min — AB (ref >60)
Glucose, Bld: 83 mg/dL (ref 65–99)
Potassium: 4.1 mmol/L (ref 3.5–5.1)
Sodium: 142 mmol/L (ref 135–145)

## 2017-01-20 LAB — CBC
HCT: 31.7 % — ABNORMAL LOW (ref 39.0–52.0)
Hemoglobin: 10.4 g/dL — ABNORMAL LOW (ref 13.0–17.0)
MCH: 30.9 pg (ref 26.0–34.0)
MCHC: 32.8 g/dL (ref 30.0–36.0)
MCV: 94.1 fL (ref 78.0–100.0)
PLATELETS: 112 10*3/uL — AB (ref 150–400)
RBC: 3.37 MIL/uL — AB (ref 4.22–5.81)
RDW: 14.6 % (ref 11.5–15.5)
WBC: 4.4 10*3/uL (ref 4.0–10.5)

## 2017-01-20 LAB — PROTIME-INR
INR: 1.16
PROTHROMBIN TIME: 14.9 s (ref 11.4–15.2)

## 2017-01-20 LAB — HEPATIC FUNCTION PANEL
ALBUMIN: 3 g/dL — AB (ref 3.5–5.0)
ALT: 22 U/L (ref 17–63)
AST: 21 U/L (ref 15–41)
Alkaline Phosphatase: 41 U/L (ref 38–126)
BILIRUBIN INDIRECT: 0.7 mg/dL (ref 0.3–0.9)
BILIRUBIN TOTAL: 0.9 mg/dL (ref 0.3–1.2)
Bilirubin, Direct: 0.2 mg/dL (ref 0.1–0.5)
TOTAL PROTEIN: 4.9 g/dL — AB (ref 6.5–8.1)

## 2017-01-20 LAB — TSH: TSH: 3.123 u[IU]/mL (ref 0.350–4.500)

## 2017-01-20 LAB — MRSA PCR SCREENING: MRSA by PCR: NEGATIVE

## 2017-01-20 LAB — TROPONIN I

## 2017-01-20 LAB — GLUCOSE, CAPILLARY: GLUCOSE-CAPILLARY: 79 mg/dL (ref 65–99)

## 2017-01-20 LAB — HEPARIN LEVEL (UNFRACTIONATED): HEPARIN UNFRACTIONATED: 0.52 [IU]/mL (ref 0.30–0.70)

## 2017-01-20 SURGERY — LEFT HEART CATH AND CORS/GRAFTS ANGIOGRAPHY
Anesthesia: LOCAL

## 2017-01-20 MED ORDER — DOCUSATE SODIUM 100 MG PO CAPS
100.0000 mg | ORAL_CAPSULE | Freq: Two times a day (BID) | ORAL | Status: DC | PRN
  Administered 2017-01-21: 100 mg via ORAL
  Filled 2017-01-20: qty 1

## 2017-01-20 MED ORDER — HEPARIN (PORCINE) IN NACL 2-0.9 UNIT/ML-% IJ SOLN
INTRAMUSCULAR | Status: AC
  Filled 2017-01-20: qty 500

## 2017-01-20 MED ORDER — ASPIRIN EC 81 MG PO TBEC
81.0000 mg | DELAYED_RELEASE_TABLET | Freq: Every day | ORAL | Status: AC
  Administered 2017-01-20: 81 mg via ORAL
  Filled 2017-01-20: qty 1

## 2017-01-20 MED ORDER — HEPARIN SODIUM (PORCINE) 1000 UNIT/ML IJ SOLN
INTRAMUSCULAR | Status: AC
  Filled 2017-01-20: qty 1

## 2017-01-20 MED ORDER — VERAPAMIL HCL 2.5 MG/ML IV SOLN
INTRAVENOUS | Status: AC
  Filled 2017-01-20: qty 2

## 2017-01-20 MED ORDER — IOPAMIDOL (ISOVUE-370) INJECTION 76%
INTRAVENOUS | Status: DC | PRN
  Administered 2017-01-20: 65 mL via INTRA_ARTERIAL

## 2017-01-20 MED ORDER — HEPARIN SODIUM (PORCINE) 5000 UNIT/ML IJ SOLN
5000.0000 [IU] | Freq: Three times a day (TID) | INTRAMUSCULAR | Status: DC
  Administered 2017-01-20 – 2017-01-22 (×5): 5000 [IU] via SUBCUTANEOUS
  Filled 2017-01-20 (×5): qty 1

## 2017-01-20 MED ORDER — ADENOSINE 12 MG/4ML IV SOLN
INTRAVENOUS | Status: AC
  Filled 2017-01-20: qty 16

## 2017-01-20 MED ORDER — TRAZODONE HCL 50 MG PO TABS
50.0000 mg | ORAL_TABLET | Freq: Every day | ORAL | Status: DC
  Administered 2017-01-20 – 2017-01-21 (×2): 50 mg via ORAL
  Filled 2017-01-20 (×2): qty 1

## 2017-01-20 MED ORDER — SODIUM CHLORIDE 0.9% FLUSH: 3.0000 mL | INTRAVENOUS | Status: DC | PRN

## 2017-01-20 MED ORDER — HEPARIN SODIUM (PORCINE) 1000 UNIT/ML IJ SOLN
INTRAMUSCULAR | Status: DC | PRN
  Administered 2017-01-20 (×2): 3000 [IU] via INTRAVENOUS

## 2017-01-20 MED ORDER — IOPAMIDOL (ISOVUE-370) INJECTION 76%
INTRAVENOUS | Status: AC
  Filled 2017-01-20: qty 125

## 2017-01-20 MED ORDER — ONDANSETRON HCL 4 MG/2ML IJ SOLN: 4.0000 mg | Freq: Four times a day (QID) | INTRAMUSCULAR | Status: DC | PRN

## 2017-01-20 MED ORDER — HEPARIN (PORCINE) IN NACL 2-0.9 UNIT/ML-% IJ SOLN
INTRAMUSCULAR | Status: AC | PRN
  Administered 2017-01-20: 1000 mL

## 2017-01-20 MED ORDER — ADENOSINE (DIAGNOSTIC) 140MCG/KG/MIN
INTRAVENOUS | Status: DC | PRN
  Administered 2017-01-20: 140 ug/kg/min via INTRAVENOUS

## 2017-01-20 MED ORDER — ASPIRIN EC 81 MG PO TBEC: 81.0000 mg | DELAYED_RELEASE_TABLET | Freq: Every day | ORAL | Status: DC

## 2017-01-20 MED ORDER — ASPIRIN 81 MG PO CHEW
324.0000 mg | CHEWABLE_TABLET | ORAL | Status: AC
  Administered 2017-01-20: 324 mg via ORAL

## 2017-01-20 MED ORDER — ASPIRIN 300 MG RE SUPP: 300.0000 mg | RECTAL | Status: AC

## 2017-01-20 MED ORDER — FENTANYL CITRATE (PF) 100 MCG/2ML IJ SOLN
INTRAMUSCULAR | Status: AC
  Filled 2017-01-20: qty 2

## 2017-01-20 MED ORDER — NITROGLYCERIN 0.4 MG SL SUBL: 0.4000 mg | SUBLINGUAL_TABLET | SUBLINGUAL | Status: DC | PRN

## 2017-01-20 MED ORDER — PERFLUTREN LIPID MICROSPHERE
1.0000 mL | INTRAVENOUS | Status: AC | PRN
  Administered 2017-01-20: 2 mL via INTRAVENOUS
  Filled 2017-01-20: qty 10

## 2017-01-20 MED ORDER — SODIUM CHLORIDE 0.9 % IV SOLN: 250.0000 mL | INTRAVENOUS | Status: DC | PRN

## 2017-01-20 MED ORDER — MIDAZOLAM HCL 2 MG/2ML IJ SOLN
INTRAMUSCULAR | Status: DC | PRN
Start: 2017-01-20 — End: 2017-01-20
  Administered 2017-01-20: 1 mg via INTRAVENOUS

## 2017-01-20 MED ORDER — ASPIRIN 81 MG PO CHEW
CHEWABLE_TABLET | ORAL | Status: AC
  Filled 2017-01-20: qty 4

## 2017-01-20 MED ORDER — LIDOCAINE HCL (PF) 1 % IJ SOLN
INTRAMUSCULAR | Status: DC | PRN
  Administered 2017-01-20: 2 mL

## 2017-01-20 MED ORDER — FENTANYL CITRATE (PF) 100 MCG/2ML IJ SOLN
INTRAMUSCULAR | Status: DC | PRN
  Administered 2017-01-20: 50 ug via INTRAVENOUS

## 2017-01-20 MED ORDER — MIDAZOLAM HCL 2 MG/2ML IJ SOLN
INTRAMUSCULAR | Status: AC
  Filled 2017-01-20: qty 2

## 2017-01-20 MED ORDER — VERAPAMIL HCL 2.5 MG/ML IV SOLN
INTRAVENOUS | Status: DC | PRN
  Administered 2017-01-20: 8 mL via INTRA_ARTERIAL

## 2017-01-20 MED ORDER — SODIUM CHLORIDE 0.9% FLUSH
3.0000 mL | Freq: Two times a day (BID) | INTRAVENOUS | Status: DC
  Administered 2017-01-20 – 2017-01-22 (×4): 3 mL via INTRAVENOUS

## 2017-01-20 MED ORDER — SODIUM CHLORIDE 0.9 % IV SOLN
INTRAVENOUS | Status: DC
  Administered 2017-01-20: 10:00:00 via INTRAVENOUS

## 2017-01-20 MED ORDER — ATORVASTATIN CALCIUM 20 MG PO TABS
20.0000 mg | ORAL_TABLET | Freq: Every day | ORAL | Status: DC
  Administered 2017-01-20 – 2017-01-21 (×2): 20 mg via ORAL
  Filled 2017-01-20 (×2): qty 1

## 2017-01-20 MED ORDER — ASPIRIN 81 MG PO CHEW: 81.0000 mg | CHEWABLE_TABLET | ORAL | Status: DC

## 2017-01-20 MED ORDER — SODIUM CHLORIDE 0.9 % IV SOLN: INTRAVENOUS | Status: AC

## 2017-01-20 MED ORDER — LIDOCAINE HCL 1 % IJ SOLN
INTRAMUSCULAR | Status: AC
  Filled 2017-01-20: qty 20

## 2017-01-20 SURGICAL SUPPLY — 12 items
CATH OPTITORQUE TIG 4.0 5F (CATHETERS) ×2 IMPLANT
CATH VISTA GUIDE 6FR JR4 (CATHETERS) ×2 IMPLANT
DEVICE RAD COMP TR BAND LRG (VASCULAR PRODUCTS) ×2 IMPLANT
GLIDESHEATH SLEND SS 6F .021 (SHEATH) ×2 IMPLANT
GUIDEWIRE INQWIRE 1.5J.035X260 (WIRE) ×1 IMPLANT
GUIDEWIRE PRESSURE COMET II (WIRE) ×2 IMPLANT
INQWIRE 1.5J .035X260CM (WIRE) ×2
KIT ESSENTIALS PG (KITS) ×2 IMPLANT
KIT HEART LEFT (KITS) ×2 IMPLANT
PACK CARDIAC CATHETERIZATION (CUSTOM PROCEDURE TRAY) ×2 IMPLANT
TRANSDUCER W/STOPCOCK (MISCELLANEOUS) ×2 IMPLANT
TUBING CIL FLEX 10 FLL-RA (TUBING) ×2 IMPLANT

## 2017-01-20 NOTE — Interval H&P Note (Signed)
Cath Lab Visit (complete for each Cath Lab visit)  Clinical Evaluation Leading to the Procedure:   ACS: Yes.   (unstable angina)  Non-ACS:  n/a      History and Physical Interval Note:  01/20/2017 3:23 PM  [facility]  has presented today for surgery, with the diagnosis of unstable angina  The various methods of treatment have been discussed with the patient and family. After consideration of risks, benefits and other options for treatment, the patient has consented to  Procedure(s): LEFT HEART CATH AND CORS/GRAFTS ANGIOGRAPHY (N/A) as a surgical intervention .  The patient's history has been reviewed, patient examined, no change in status, stable for surgery.  I have reviewed the patient's chart and labs.  Questions were answered to the patient's satisfaction.     Kathlyn Sacramento

## 2017-01-20 NOTE — Progress Notes (Signed)
ANTICOAGULATION CONSULT NOTE - Follow Up Consult  Pharmacy Consult:  Heparin Indication:  [place]  Allergies  Allergen Reactions  . Cold Medicine Plus [Chlorphen-Pseudoephed-Apap]     Reaction a long time ago - made him feel goofy    Patient Measurements: Height: 5\' 11"  (180.3 cm) Weight: 119 lb 3.2 oz (54.1 kg) IBW/kg (Calculated) : 75.3 Heparin Dosing Weight: 54 kg  Vital Signs: Temp: 97.9 F (36.6 C) (08/15 0500) Temp Source: Oral (08/15 0500) BP: 98/67 (08/15 0500) Pulse Rate: 60 (08/15 0500)  Labs:  Recent Labs  01/19/17 1825 01/20/17 0243 01/20/17 0715  HGB 11.7*  --  10.4*  HCT 36.1*  --  31.7*  PLT 146*  --  PENDING  HEPARINUNFRC  --   --  0.52  CREATININE 1.28*  --  1.28*  TROPONINI  --  <0.03 <0.03    Estimated Creatinine Clearance: 34.6 mL/min (A) (by C-G formula based on SCr of 1.28 mg/dL (H)).     Assessment: 83 YOM to continue on IV heparin for [place].  Heparin level is therapeutic; no bleeding reported.   Goal of Therapy:  Heparin level 0.3-0.7 units/ml Monitor platelets by anticoagulation protocol: Yes    Plan:  Continue heparin gtt at 650 units/hr Daily heparin level and CBC F/U ECHO    Elisabetta Mishra D. Mina Marble, PharmD, BCPS Pager:  [phone] 01/20/2017, 8:36 AM

## 2017-01-20 NOTE — Progress Notes (Signed)
Progress Note  Patient Name: [facility] Date of Encounter: 01/20/2017  Primary Cardiologist: Dr. Stanford Breed  Subjective   Reports having worsening chest tightness with exertion for the past several weeks. Also notes weakness which is being followed by his PCP (reports all prior BP medications have been discontinued secondary to hypotension).   Inpatient Medications    Scheduled Meds: . aspirin      . [START ON 01/21/2017] aspirin EC  81 mg Oral Daily  . traZODone  50 mg Oral QHS   Continuous Infusions: . heparin 650 Units/hr (01/19/17 2350)   PRN Meds: docusate sodium, nitroGLYCERIN, ondansetron (ZOFRAN) IV   Vital Signs    Vitals:   01/19/17 2300 01/20/17 0000 01/20/17 0118 01/20/17 0500  BP: 113/70 105/77 119/87 98/67  Pulse: (!) 58 (!) 48  60  Resp: 16 (!) 21 (!) 23 14  Temp:    97.9 F (36.6 C)  TempSrc:    Oral  SpO2: 98%  99% 99%  Weight:   119 lb 3.2 oz (54.1 kg) 119 lb 3.2 oz (54.1 kg)  Height:   5\' 11"  (1.803 m)     Intake/Output Summary (Last 24 hours) at 01/20/17 0927 Last data filed at 01/20/17 0700  Gross per 24 hour  Intake            46.58 ml  Output                0 ml  Net            46.58 ml   Filed Weights   01/19/17 1820 01/20/17 0118 01/20/17 0500  Weight: 125 lb (56.7 kg) 119 lb 3.2 oz (54.1 kg) 119 lb 3.2 oz (54.1 kg)    Telemetry    A-paced, HR in 60's.  - Personally Reviewed  ECG    No new tracings.   Physical Exam   General: Thin-Caucasian  male appearing in no acute distress. Head: Normocephalic, atraumatic.  Neck: Supple without bruits, JVD. Lungs:  Resp regular and unlabored, CTA without wheezing or rales. Pectus excavatum.  Heart: RRR, S1, S2, no S3, S4, or murmur; no rub. Abdomen: Soft, non-tender, non-distended with normoactive bowel sounds. No hepatomegaly. No rebound/guarding. No obvious abdominal masses. Extremities: No clubbing, cyanosis, or lower extremity edema. Distal pedal pulses are 2+ bilaterally. Neuro:  Alert and oriented X 3. Moves all extremities spontaneously. Psych: Normal affect.  Labs    Chemistry Recent Labs Lab 01/19/17 1825 01/20/17 0243 01/20/17 0715  NA 136  --  142  K 4.1  --  4.1  CL 106  --  109  CO2 23  --  26  GLUCOSE 96  --  83  BUN 32*  --  29*  CREATININE 1.28*  --  1.28*  CALCIUM 9.1  --  8.7*  PROT  --  4.9*  --   ALBUMIN  --  3.0*  --   AST  --  21  --   ALT  --  22  --   ALKPHOS  --  41  --   BILITOT  --  0.9  --   GFRNONAA 51*  --  51*  GFRAA 59*  --  59*  ANIONGAP 7  --  7     Hematology Recent Labs Lab 01/19/17 1825 01/20/17 0715  WBC 5.5 4.4  RBC 3.81* 3.37*  HGB 11.7* 10.4*  HCT 36.1* 31.7*  MCV 94.8 94.1  MCH 30.7 30.9  MCHC 32.4 32.8  RDW 14.7 14.6  PLT 146* 112*    Cardiac Enzymes Recent Labs Lab 01/20/17 0243 01/20/17 0715  TROPONINI <0.03 <0.03    Recent Labs Lab 01/19/17 1836  TROPIPOC 0.00     BNPNo results for input(s): BNP, PROBNP in the last 168 hours.   DDimer No results for input(s): DDIMER in the last 168 hours.   Radiology    Dg Chest 2 View  Result Date: 01/19/2017 CLINICAL DATA:  Chest pain EXAM: CHEST  2 VIEW COMPARISON:  09/14/2012 FINDINGS: Left-sided pacing device. Post sternotomy changes. Hyperinflation without focal infiltrate or effusion. Mild cardiomegaly with atherosclerosis. Probable apical scarring on the right. No definitive pneumothorax. Osteopenia of the spine with stable mild compression deformities of the mid to lower thoracic spine. IMPRESSION: Hyperinflation without acute infiltrate.  Cardiomegaly. Electronically Signed   By: Donavan Foil M.D.   On: 01/19/2017 18:59    Cardiac Studies   NST: 01/12/2017  The left ventricular ejection fraction is severely decreased (<30%).  Nuclear stress EF: 29%.  No T wave inversion was noted during stress.  There was no ST segment deviation noted during stress.  Defect 1: There is a large defect of severe severity.  Findings consistent  with prior myocardial infarction.  This is a high risk study.   Large size (52% of myocardium), severe intensity fixed perfusion defect of the anterior, septal, apical and inferoapical walls suggestive of extensive scar. No significant reversible ischemia. LVEF 29% with akinesis of the mid to distal anterior, anteroseptal, apical and inferoapical walls. This is a high risk study.   Patient Profile     83 y.o. male w/ PMH of CAD (s/p CABG in 1996 with STEMI in 05/2012 and cath showing patent SVG-OM1-OM2 and SVG-D1 with occluded SVG-RCA and atretic LIMA-LAD with thrombectomy of proximal LAD occlusion), HTN, HLD, ischemic cardiomyopathy (s/p ICD placement in 2014), and chronic combined systolic and diastolic CHF (EF 15% by echo in 2014) who presented to [name] ED on 01/19/2017 for evaluation of chest pain.   Assessment & Plan    1. Unstable Angina - reports having exertional chest tightness for the past several weeks. Recent NST showed significant scar with no reversible ischemia.  - initial troponin values have been negative and EKG is without acute ischemic changes.  - with his continued symptoms and recent NST, a definitive evaluation with a cardiac catheterization is indicated. If no targets for PCI, will need to consider addition of Imdur and resumption of BB for anti-anginal benefit.  - The patient understands that risks include but are not limited to stroke (1 in 1000), death (1 in 65), kidney failure [usually temporary] (1 in 500), bleeding (1 in 200), allergic reaction [possibly serious] (1 in 200). Has been added to the cath board for later today. Will start gentle hydration in the setting of his reduced EF.   2. CAD - s/p CABG in 1996 with STEMI in 05/2012 and cath showing patent SVG-OM1-OM2 and SVG-D1 with occluded SVG-RCA and atretic LIMA-LAD with thrombectomy of proximal LAD occlusion performed at that time. - continue ASA. Restart Lipitor. Possibly resume BB if BP allows.    3. Ischemic Cardiomyopathy/ Chronic Combined Systolic and Diastolic CHF - EF at 17% by echo in 2014. S/p ICD placement at that time which is followed by Dr. Rayann Heman.  - he does not appear volume overloaded on examination. - previously on BB and ACE-I therapy but these were discontinued on 8/13 secondary to hypotension. Possibly resume BB prior to  discharge at a lower dose.    4. HTN - had been experiencing episodes of hypotension PTA and held his Coreg, Lasix, and Lisinopril, having not taken in the past week. Would like to restart Coreg at a lower dose of 3.125mg  BID prior to discharge if BP allows.   5. HLD - previously on Atorvastatin 80mg  daily but this was recently discontinued secondary to weakness. Lipid Panel in 08/2016 showed LDL at 39. Would restart at 20mg  daily as weakness did not improve with cessation of this.   6. Stage 3 CKD - baseline 1.2 - 1.3. Stable at 1.28 this AM.    Signed, Erma Heritage , [name] 9:27 AM 01/20/2017 Pager: [phone]

## 2017-01-20 NOTE — H&P (View-Only) (Signed)
Progress Note  Patient Name: [facility] Date of Encounter: 01/20/2017  Primary Cardiologist: Dr. Stanford [facility]  Subjective   Reports having worsening chest tightness with exertion for the past several weeks. Also notes weakness which is being followed by his PCP (reports all prior BP medications have been discontinued secondary to hypotension).   Inpatient Medications    Scheduled Meds: . aspirin      . [START ON 01/21/2017] aspirin EC  81 mg Oral Daily  . traZODone  50 mg Oral QHS   Continuous Infusions: . heparin 650 Units/hr (01/19/17 2350)   PRN Meds: docusate sodium, nitroGLYCERIN, ondansetron (ZOFRAN) IV   Vital Signs    Vitals:   01/19/17 2300 01/20/17 0000 01/20/17 0118 01/20/17 0500  BP: 113/70 105/77 119/87 98/67  Pulse: (!) 58 (!) 48  60  Resp: 16 (!) 21 (!) 23 14  Temp:    97.9 F (36.6 C)  TempSrc:    Oral  SpO2: 98%  99% 99%  Weight:   119 lb 3.2 oz (54.1 kg) 119 lb 3.2 oz (54.1 kg)  Height:   5\' 11"  (1.803 m)     Intake/Output Summary (Last 24 hours) at 01/20/17 0927 Last data filed at 01/20/17 0700  Gross per 24 hour  Intake            46.58 ml  Output                0 ml  Net            46.58 ml   Filed Weights   01/19/17 1820 01/20/17 0118 01/20/17 0500  Weight: 125 lb (56.7 kg) 119 lb 3.2 oz (54.1 kg) 119 lb 3.2 oz (54.1 kg)    Telemetry    A-paced, HR in 60's.  - Personally Reviewed  ECG    No new tracings.   Physical Exam   General: Thin-Caucasian  male appearing in no acute distress. Head: Normocephalic, atraumatic.  Neck: Supple without bruits, JVD. Lungs:  Resp regular and unlabored, CTA without wheezing or rales. Pectus excavatum.  Heart: RRR, S1, S2, no S3, S4, or murmur; no rub. Abdomen: Soft, non-tender, non-distended with normoactive bowel sounds. No hepatomegaly. No rebound/guarding. No obvious abdominal masses. Extremities: No clubbing, cyanosis, or lower extremity edema. Distal pedal pulses are 2+ bilaterally. Neuro:  Alert and oriented X 3. Moves all extremities spontaneously. Psych: Normal affect.  Labs    Chemistry Recent Labs Lab 01/19/17 1825 01/20/17 0243 01/20/17 0715  NA 136  --  142  K 4.1  --  4.1  CL 106  --  109  CO2 23  --  26  GLUCOSE 96  --  83  BUN 32*  --  29*  CREATININE 1.28*  --  1.28*  CALCIUM 9.1  --  8.7*  PROT  --  4.9*  --   ALBUMIN  --  3.0*  --   AST  --  21  --   ALT  --  22  --   ALKPHOS  --  41  --   BILITOT  --  0.9  --   GFRNONAA 51*  --  51*  GFRAA 59*  --  59*  ANIONGAP 7  --  7     Hematology Recent Labs Lab 01/19/17 1825 01/20/17 0715  WBC 5.5 4.4  RBC 3.81* 3.37*  HGB 11.7* 10.4*  HCT 36.1* 31.7*  MCV 94.8 94.1  MCH 30.7 30.9  MCHC 32.4 32.8  RDW 14.7 14.6  PLT 146* 112*    Cardiac Enzymes Recent Labs Lab 01/20/17 0243 01/20/17 0715  TROPONINI <0.03 <0.03    Recent Labs Lab 01/19/17 1836  TROPIPOC 0.00     BNPNo results for input(s): BNP, PROBNP in the last 168 hours.   DDimer No results for input(s): DDIMER in the last 168 hours.   Radiology    Dg Chest 2 View  Result Date: 01/19/2017 CLINICAL DATA:  Chest pain EXAM: CHEST  2 VIEW COMPARISON:  09/14/2012 FINDINGS: Left-sided pacing device. Post sternotomy changes. Hyperinflation without focal infiltrate or effusion. Mild cardiomegaly with atherosclerosis. Probable apical scarring on the right. No definitive pneumothorax. Osteopenia of the spine with stable mild compression deformities of the mid to lower thoracic spine. IMPRESSION: Hyperinflation without acute infiltrate.  Cardiomegaly. Electronically Signed   By: Donavan Foil M.D.   On: 01/19/2017 18:59    Cardiac Studies   NST: 01/12/2017  The left ventricular ejection fraction is severely decreased (<30%).  Nuclear stress EF: 29%.  No T wave inversion was noted during stress.  There was no ST segment deviation noted during stress.  Defect 1: There is a large defect of severe severity.  Findings consistent  with prior myocardial infarction.  This is a high risk study.   Large size (52% of myocardium), severe intensity fixed perfusion defect of the anterior, septal, apical and inferoapical walls suggestive of extensive scar. No significant reversible ischemia. LVEF 29% with akinesis of the mid to distal anterior, anteroseptal, apical and inferoapical walls. This is a high risk study.   Patient Profile     83 y.o. male w/ PMH of CAD (s/p CABG in 1996 with STEMI in 05/2012 and cath showing patent SVG-OM1-OM2 and SVG-D1 with occluded SVG-RCA and atretic LIMA-LAD with thrombectomy of proximal LAD occlusion), HTN, HLD, ischemic cardiomyopathy (s/p ICD placement in 2014), and chronic combined systolic and diastolic CHF (EF 89% by echo in 2014) who presented to [facility] on 01/19/2017 for evaluation of chest pain.   Assessment & Plan    1. Unstable Angina - reports having exertional chest tightness for the past several weeks. Recent NST showed significant scar with no reversible ischemia.  - initial troponin values have been negative and EKG is without acute ischemic changes.  - with his continued symptoms and recent NST, a definitive evaluation with a cardiac catheterization is indicated. If no targets for PCI, will need to consider addition of Imdur and resumption of BB for anti-anginal benefit.  - The patient understands that risks include but are not limited to stroke (1 in 1000), death (1 in 34), kidney failure [usually temporary] (1 in 500), bleeding (1 in 200), allergic reaction [possibly serious] (1 in 200). Has been added to the cath board for later today. Will start gentle hydration in the setting of his reduced EF.   2. CAD - s/p CABG in 1996 with STEMI in 05/2012 and cath showing patent SVG-OM1-OM2 and SVG-D1 with occluded SVG-RCA and atretic LIMA-LAD with thrombectomy of proximal LAD occlusion performed at that time. - continue ASA. Restart Lipitor. Possibly resume BB if BP allows.    3. Ischemic Cardiomyopathy/ Chronic Combined Systolic and Diastolic CHF - EF at 21% by echo in 2014. S/p ICD placement at that time which is followed by Dr. Rayann [facility].  - he does not appear volume overloaded on examination. - previously on BB and ACE-I therapy but these were discontinued on 8/13 secondary to hypotension. Possibly resume BB prior to  discharge at a lower dose.    4. HTN - had been experiencing episodes of hypotension PTA and held his Coreg, Lasix, and Lisinopril, having not taken in the past week. Would like to restart Coreg at a lower dose of 3.125mg  BID prior to discharge if BP allows.   5. HLD - previously on Atorvastatin 80mg  daily but this was recently discontinued secondary to weakness. Lipid Panel in 08/2016 showed LDL at 39. Would restart at 20mg  daily as weakness did not improve with cessation of this.   6. Stage 3 CKD - baseline 1.2 - 1.3. Stable at 1.28 this AM.    Signed, Erma Heritage , [name] 9:27 AM 01/20/2017 Pager: [phone]

## 2017-01-20 NOTE — ED Notes (Signed)
Attempted report x1. 

## 2017-01-20 NOTE — Progress Notes (Signed)
  Echocardiogram 2D Echocardiogram with Definity has been performed.  Tresa Res 01/20/2017, 1:21 PM

## 2017-01-21 ENCOUNTER — Encounter (HOSPITAL_COMMUNITY): Payer: Self-pay | Admitting: Cardiovascular Disease

## 2017-01-21 DIAGNOSIS — I5022 Chronic systolic (congestive) heart failure: Secondary | ICD-10-CM

## 2017-01-21 DIAGNOSIS — R079 Chest pain, unspecified: Secondary | ICD-10-CM

## 2017-01-21 DIAGNOSIS — I5042 Chronic combined systolic (congestive) and diastolic (congestive) heart failure: Secondary | ICD-10-CM

## 2017-01-21 LAB — ECHOCARDIOGRAM COMPLETE
HEIGHTINCHES: 71 in
WEIGHTICAEL: 1907.21 [oz_av]

## 2017-01-21 LAB — BASIC METABOLIC PANEL
ANION GAP: 8 (ref 5–15)
BUN: 23 mg/dL — AB (ref 6–20)
CALCIUM: 8.3 mg/dL — AB (ref 8.9–10.3)
CO2: 22 mmol/L (ref 22–32)
Chloride: 112 mmol/L — ABNORMAL HIGH (ref 101–111)
Creatinine, Ser: 1.19 mg/dL (ref 0.61–1.24)
GFR calc Af Amer: >60 mL/min (ref >60)
GFR, EST NON AFRICAN AMERICAN: 55 mL/min — AB (ref >60)
GLUCOSE: 95 mg/dL (ref 65–99)
Potassium: 4.1 mmol/L (ref 3.5–5.1)
SODIUM: 142 mmol/L (ref 135–145)

## 2017-01-21 MED ORDER — ASPIRIN EC 81 MG PO TBEC
81.0000 mg | DELAYED_RELEASE_TABLET | Freq: Every day | ORAL | Status: DC
  Administered 2017-01-21 – 2017-01-22 (×2): 81 mg via ORAL
  Filled 2017-01-21 (×2): qty 1

## 2017-01-21 MED ORDER — CARVEDILOL 3.125 MG PO TABS
3.1250 mg | ORAL_TABLET | Freq: Two times a day (BID) | ORAL | Status: DC
  Administered 2017-01-21: 3.125 mg via ORAL
  Filled 2017-01-21 (×2): qty 1

## 2017-01-21 MED FILL — Lidocaine HCl Local Inj 1%: INTRAMUSCULAR | Qty: 2 | Status: AC

## 2017-01-21 NOTE — Progress Notes (Signed)
Progress Note  Patient Name: [facility] Date of Encounter: 01/21/2017  Primary Cardiologist: Dr. Stanford Breed  Subjective   Still reports dizziness, worse with ambulation. Chest pain has improved. Breathing at baseline.   Inpatient Medications    Scheduled Meds: . atorvastatin  20 mg Oral q1800  . heparin  5,000 Units Subcutaneous Q8H  . sodium chloride flush  3 mL Intravenous Q12H  . traZODone  50 mg Oral QHS   Continuous Infusions: . sodium chloride     PRN Meds: sodium chloride, docusate sodium, nitroGLYCERIN, ondansetron (ZOFRAN) IV, sodium chloride flush   Vital Signs    Vitals:   01/20/17 2010 01/21/17 0017 01/21/17 0519 01/21/17 0752  BP: 94/66 93/68 116/85 107/64  Pulse: 65 71 87 70  Resp: 16 13 17 19   Temp: 97.9 F (36.6 C) 98 F (36.7 C)  98.5 F (36.9 C)  TempSrc: Oral Oral  Oral  SpO2: 97% 97%  97%  Weight:   120 lb 4.8 oz (54.6 kg)   Height:        Intake/Output Summary (Last 24 hours) at 01/21/17 1018 Last data filed at 01/21/17 0753  Gross per 24 hour  Intake           766.21 ml  Output              250 ml  Net           516.21 ml   Filed Weights   01/20/17 0118 01/20/17 0500 01/21/17 0519  Weight: 119 lb 3.2 oz (54.1 kg) 119 lb 3.2 oz (54.1 kg) 120 lb 4.8 oz (54.6 kg)    Telemetry    A-paced, HR in 60's - 70's.  - Personally Reviewed  ECG    No new tracings.   Physical Exam   General: Well developed, elderly Caucasian male appearing in no acute distress. Head: Normocephalic, atraumatic.  Neck: Supple without bruits, JVD not elevated. Lungs:  Resp regular and unlabored, CTA without wheezing or rales. Heart: RRR, S1, S2, no S3, S4, or murmur; no rub. Abdomen: Soft, non-tender, non-distended with normoactive bowel sounds. No hepatomegaly. No rebound/guarding. No obvious abdominal masses. Extremities: No clubbing, cyanosis, or edema. Distal pedal pulses are 2+ bilaterally. Radial cath site is stable.  Neuro: Alert and oriented X 3.  Moves all extremities spontaneously. Psych: Normal affect.  Labs    Chemistry Recent Labs Lab 01/19/17 1825 01/20/17 0243 01/20/17 0715 01/21/17 0328  NA 136  --  142 142  K 4.1  --  4.1 4.1  CL 106  --  109 112*  CO2 23  --  26 22  GLUCOSE 96  --  83 95  BUN 32*  --  29* 23*  CREATININE 1.28*  --  1.28* 1.19  CALCIUM 9.1  --  8.7* 8.3*  PROT  --  4.9*  --   --   ALBUMIN  --  3.0*  --   --   AST  --  21  --   --   ALT  --  22  --   --   ALKPHOS  --  41  --   --   BILITOT  --  0.9  --   --   GFRNONAA 51*  --  51* 55*  GFRAA 59*  --  59* >60  ANIONGAP 7  --  7 8     Hematology Recent Labs Lab 01/19/17 1825 01/20/17 0715  WBC 5.5 4.4  RBC 3.81* 3.37*  HGB 11.7* 10.4*  HCT 36.1* 31.7*  MCV 94.8 94.1  MCH 30.7 30.9  MCHC 32.4 32.8  RDW 14.7 14.6  PLT 146* 112*    Cardiac Enzymes Recent Labs Lab 01/20/17 0243 01/20/17 0715 01/20/17 1442  TROPONINI <0.03 <0.03 <0.03    Recent Labs Lab 01/19/17 1836  TROPIPOC 0.00     BNPNo results for input(s): BNP, PROBNP in the last 168 hours.   DDimer No results for input(s): DDIMER in the last 168 hours.   Radiology    Dg Chest 2 View  Result Date: 01/19/2017 CLINICAL DATA:  Chest pain EXAM: CHEST  2 VIEW COMPARISON:  09/14/2012 FINDINGS: Left-sided pacing device. Post sternotomy changes. Hyperinflation without focal infiltrate or effusion. Mild cardiomegaly with atherosclerosis. Probable apical scarring on the right. No definitive pneumothorax. Osteopenia of the spine with stable mild compression deformities of the mid to lower thoracic spine. IMPRESSION: Hyperinflation without acute infiltrate.  Cardiomegaly. Electronically Signed   By: Donavan Foil M.D.   On: 01/19/2017 18:59    Cardiac Studies   Cardiac Catheterization: 01/20/2017  Ost Cx to Prox Cx lesion, 80 %stenosed.  Prox Cx to Mid Cx lesion, 100 %stenosed.  Prox LAD to Dist LAD lesion, 20 %stenosed.  Dist LAD lesion, 30 %stenosed.  Prox RCA  lesion, 70 %stenosed.  Prox RCA to Mid RCA lesion, 30 %stenosed.  Ost 2nd Diag to 2nd Diag lesion, 100 %stenosed.  SVG.  The graft exhibits mild .  SVG.  Origin to Prox Graft lesion, 30 %stenosed.  Mid Graft lesion, 20 %stenosed.  SVG graft was not injected.  Origin lesion, 100 %stenosed.   1.significant underlying three-vessel coronary artery disease with patent SVG to diagonal and patent sequential SVG to OM 2 and OM 3. Patent proximal LAD stent. LIMA to LAD is known to be atretic and SVG to RCA is known to be occluded. His were not injected. 70% proximal RCA stenosis which is stable from most recent angiography in 2013. This was evaluated by pressure wire and was found to be not significant with an FFR ratio of 0.88.  2. Normal LV  Ventricular end-diastolic pressure. Left ventricular angiography was not performed given chronic kidney disease. EF is known to be severely reduced by echo.  Recommendations: I don't see a culprit for unstable angina. Continue medical therapy. It is possible some of his symptoms might be related to low cardiac output heart failure or some other noncardiac causes.  Patient Profile     83 y.o. male w/ PMH of CAD (s/p CABG in 1996 with STEMI in 05/2012 and cath showing patent SVG-OM1-OM2 and SVG-D1 with occluded SVG-RCA and atretic LIMA-LAD with thrombectomy of proximal LAD occlusion), HTN, HLD, ischemic cardiomyopathy (s/p ICD placement in 2014), and chronic combined systolic and diastolic CHF (EF 45% by echo in 2014) who presented to [name] ED on 01/19/2017 for evaluation of chest pain.   Assessment & Plan    1. Unstable Angina - reports having exertional chest tightness for the past several weeks. Recent NST showed significant scar with no reversible ischemia.  - cyclic troponin values have been negative and EKG is without acute ischemic changes.  - cardiac catheterization was performed on 8/15 and showed underlying 3-v CAD with patent SVG to  diagonal and patent sequential SVG to OM 2 and OM 3. Patent proximal LAD stent. LIMA to LAD is known to be atretic and SVG to RCA is known to be occluded. 70% proximal RCA stenosis was noted but not  significant with an FFR ratio of 0.88. Continued medical therapy was recommended.  - continue ASA and statin. Restart BB.   2. CAD - s/p CABG in 1996 with STEMI in 05/2012 and cath showing patent SVG-OM1-OM2 and SVG-D1 with occluded SVG-RCA and atretic LIMA-LAD with thrombectomy of proximal LAD occlusion performed at that time. - continue ASA. Restart Lipitor.  3. Ischemic Cardiomyopathy/ Chronic Combined Systolic and Diastolic CHF - EF at 73% by echo in 2014. S/p ICD placement at that time which is followed by Dr. Rayann Heman.  - he does not appear volume overloaded on examination. - previously on BB and ACE-I therapy but these were discontinued on 8/13 secondary to hypotension. Will resume Coreg at a lower dose of 3.125mg  BID.    4. HTN - had been experiencing episodes of hypotension PTA and held his Coreg, Lasix, and Lisinopril, having not taken in the past week.  - BP soft at 85/52 - 138/85 within the past 24 hours.   5. HLD - previously on Atorvastatin 80mg  daily but this was recently discontinued secondary to weakness. Lipid Panel in 08/2016 showed LDL at 39. Have restarted at 20mg  daily.  6. Stage 3 CKD - baseline 1.2 - 1.3. Stable at 1.19 this AM.    7. Dizziness - recurrent issue for the patient. Will recheck orthostatics. Monitor on telemetry. - monitor BP closely with resumption of BB therapy.   Signed, Erma Heritage , [name] 10:18 AM 01/21/2017 Pager: [phone]

## 2017-01-21 NOTE — Evaluation (Signed)
Physical Therapy Evaluation Patient Details Name: Louis Reid MRN: [id number] DOB: [birth year] Today's Date: 01/21/2017   History of Present Illness  Pt is an 83 y/o male admitted secondary to chest tightness with exertion, and is s/p L heart cath. PMH includes STEMI s/p CABG X5, CAD, HTN, PE, a fib, and osteoporosis.   Clinical Impression  Pt admitted secondary to problem above with deficits below. PTA, pt was independent with functional mobility and lives alone. Upon eval, pt presenting with decreased strength, BLE muscle fatigue, and mildly decreased balance. Pt requiring supervision for safety for all mobility tasks. Reports he doesn't feel ready to go home and feels he needs a couple more days to figure out his medicine and symptoms. Encouraged to talk with MD about concerns. Recommended HHPT and HHOT and discussed with pt, however, pt reports he wasn't sure he'd need it, but would think about it. Feel he would benefit from safety eval to ensure safety at home. Will continue to follow acutely to maximize functional mobility independence and safety.     Follow Up Recommendations Home health PT (HHOT for safety eval )    Equipment Recommendations  None recommended by PT (Pt refused 3 in 1)    Recommendations for Other Services       Precautions / Restrictions Precautions Precautions: Fall Restrictions Weight Bearing Restrictions: No      Mobility  Bed Mobility Overal bed mobility: Modified Independent             General bed mobility comments: Increased time, however, no assist required.   Transfers Overall transfer level: Needs assistance Equipment used: Rolling walker (2 wheeled) Transfers: Sit to/from Stand Sit to Stand: Supervision         General transfer comment: Supervision for safety. Verbal cues for safe hand placement.   Ambulation/Gait Ambulation/Gait assistance: Supervision Ambulation Distance (Feet): 150 Feet Assistive device: Rolling walker (2  wheeled) Gait Pattern/deviations: Step-through pattern;Decreased stride length;Trunk flexed Gait velocity: Decreased Gait velocity interpretation: Below normal speed for age/gender General Gait Details: Slow, overall steady gait with use of RW. Pt required cues for upright posture throughout. Reporting weakness and fatigue in BLE during gait, however, no LOB noted.   Stairs            Wheelchair Mobility    Modified Rankin (Stroke Patients Only)       Balance Overall balance assessment: Needs assistance Sitting-balance support: No upper extremity supported;Feet supported Sitting balance-Leahy Scale: Good     Standing balance support: Bilateral upper extremity supported;During functional activity Standing balance-Leahy Scale: Poor Standing balance comment: Reliant on RW for stability                              Pertinent Vitals/Pain Pain Assessment: No/denies pain    Home Living Family/patient expects to be discharged to:: Private residence Living Arrangements: Alone Available Help at Discharge: Family;Available PRN/intermittently Type of Home: House Home Access: Stairs to enter Entrance Stairs-Rails: Right;Left;Can reach both Entrance Stairs-Number of Steps: 5 Home Layout: Two level;Able to live on main level with bedroom/bathroom Home Equipment: Gilford Rile - 2 wheels      Prior Function Level of Independence: Independent               Hand Dominance   Dominant Hand: Right    Extremity/Trunk Assessment   Upper Extremity Assessment Upper Extremity Assessment: Overall WFL for tasks assessed    Lower Extremity Assessment Lower Extremity Assessment:  RLE deficits/detail;LLE deficits/detail RLE Deficits / Details: [place] in the shin and feet. Reports weakness with ambulation. Grossly 3+/5 throughout.  LLE Deficits / Details: [place] in the shin and feet. Reports weakness with ambulation. Grossly 3+/5 throughout.     Cervical / Trunk  Assessment Cervical / Trunk Assessment: Kyphotic  Communication   Communication: No difficulties  Cognition Arousal/Alertness: Awake/alert Behavior During Therapy: WFL for tasks assessed/performed Overall Cognitive Status: Within Functional Limits for tasks assessed                                        General Comments General comments (skin integrity, edema, etc.): Educated pt about use of equipment at home. Educated to use RW to increase safety with mobility and have someone check on him throughout the day. Educated about use of 3 in 1, however, pt currently refusing. Recommended HHPT and HHOT to increase safety at home, and pt stated "I'll think about it, but I don't think I'll need it." Pt reporting he doesn't feel like he is ready to go home from a medication standpoint and symptom standpoint, so encouraged pt to speak with MD.     Exercises     Assessment/Plan    PT Assessment Patient needs continued PT services  PT Problem List Decreased strength;Decreased balance;Decreased mobility;Decreased knowledge of use of DME;Decreased knowledge of precautions       PT Treatment Interventions DME instruction;Gait training;Stair training;Functional mobility training;Therapeutic activities;Balance training;Therapeutic exercise;Cognitive remediation    PT Goals (Current goals can be found in the Care Plan section)  Acute Rehab PT Goals Patient Stated Goal: to be independent  PT Goal Formulation: With patient Time For Goal Achievement: 01/28/17 Potential to Achieve Goals: Good    Frequency Min 3X/week   Barriers to discharge Decreased caregiver support Lives alone     Co-evaluation               AM-PAC PT "6 Clicks" Daily Activity  Outcome Measure Difficulty turning over in bed (including adjusting bedclothes, sheets and blankets)?: None Difficulty moving from lying on back to sitting on the side of the bed? : None Difficulty sitting down on and standing  up from a chair with arms (e.g., wheelchair, bedside commode, etc,.)?: None Help needed moving to and from a bed to chair (including a wheelchair)?: None Help needed walking in hospital room?: A Little Help needed climbing 3-5 steps with a railing? : A Little 6 Click Score: 22    End of Session Equipment Utilized During Treatment: Gait belt Activity Tolerance: Patient tolerated treatment well Patient left: in bed;with call bell/phone within reach Nurse Communication: Mobility status PT Visit Diagnosis: Unsteadiness on feet (R26.81);Muscle weakness (generalized) (M62.81)    Time: 1059-1130 PT Time Calculation (min) (ACUTE ONLY): 31 min   Charges:   PT Evaluation $PT Eval Moderate Complexity: 1 Mod PT Treatments $Gait Training: 8-22 mins   PT G Codes:        Leighton Ruff, PT, DPT  Acute Rehabilitation Services  Pager: [phone]   [name] 01/21/2017, 12:31 PM

## 2017-01-22 DIAGNOSIS — I9589 Other hypotension: Secondary | ICD-10-CM

## 2017-01-22 DIAGNOSIS — I5042 Chronic combined systolic (congestive) and diastolic (congestive) heart failure: Secondary | ICD-10-CM

## 2017-01-22 LAB — BASIC METABOLIC PANEL
Anion gap: 4 — ABNORMAL LOW (ref 5–15)
BUN: 19 mg/dL (ref 6–20)
CHLORIDE: 112 mmol/L — AB (ref 101–111)
CO2: 24 mmol/L (ref 22–32)
CREATININE: 1.1 mg/dL (ref 0.61–1.24)
Calcium: 8.3 mg/dL — ABNORMAL LOW (ref 8.9–10.3)
GFR calc Af Amer: >60 mL/min (ref >60)
GLUCOSE: 92 mg/dL (ref 65–99)
Potassium: 3.9 mmol/L (ref 3.5–5.1)
SODIUM: 140 mmol/L (ref 135–145)

## 2017-01-22 MED ORDER — ATORVASTATIN CALCIUM 20 MG PO TABS: 20.0000 mg | ORAL_TABLET | Freq: Every day | ORAL | 3 refills | Status: DC

## 2017-01-22 MED ORDER — NITROGLYCERIN 0.4 MG SL SUBL: 0.4000 mg | SUBLINGUAL_TABLET | SUBLINGUAL | 12 refills | Status: DC | PRN

## 2017-01-22 NOTE — Discharge Instructions (Signed)
Hypotension As your heart beats, it forces blood through your body. This force is called blood pressure. If you have hypotension, you have low blood pressure. When your blood pressure is too low, you may not get enough blood to your brain. You may feel weak, feel light-headed, have a fast heartbeat, or even pass out (faint). Follow these instructions at home: Eating and drinking  Drink enough fluids to keep your pee (urine) clear or pale yellow.  Eat a healthy diet, and follow instructions from your doctor about eating or drinking restrictions. A healthy diet includes: ? Fresh fruits and vegetables. ? Whole grains. ? Low-fat (lean) meats. ? Low-fat dairy products.  Eat extra salt only as told. Do not add extra salt to your diet unless your doctor tells you to.  Eat small meals often.  Avoid standing up quickly after you eat. Medicines  Take over-the-counter and prescription medicines only as told by your doctor. ? Follow instructions from your doctor about changing how much you take (the dosage) of your medicines, if this applies. ? Do not stop or change your medicine on your own. General instructions  Wear compression stockings as told by your doctor.  Get up slowly from lying down or sitting.  Avoid hot showers and a lot of heat as told by your doctor.  Return to your normal activities as told by your doctor. Ask what activities are safe for you.  Do not use any products that contain nicotine or tobacco, such as cigarettes and e-cigarettes. If you need help quitting, ask your doctor.  Keep all follow-up visits as told by your doctor. This is important. Contact a doctor if:  You throw up (vomit).  You have watery poop (diarrhea).  You have a fever for more than 2-3 days.  You feel more thirsty than normal.  You feel weak and tired. Get help right away if:  You have chest pain.  You have a fast or irregular heartbeat.  You lose feeling (get numbness) in any  part of your body.  You cannot move your arms or your legs.  You have trouble talking.  You get sweaty or feel light-headed.  You faint.  You have trouble breathing.  You have trouble staying awake.  You feel confused. This information is not intended to replace advice given to you by your health care provider. Make sure you discuss any questions you have with your health care provider. Document Released: 08/19/2009 Document Revised: 02/11/2016 Document Reviewed: 02/11/2016 Elsevier Interactive Patient Education  2017 [place]. Heart Failure Heart failure means your heart has trouble pumping blood. This makes it hard for your body to work well. Heart failure is usually a long-term (chronic) condition. You must take good care of yourself and follow your doctor's treatment plan. Follow these instructions at home:  Take your heart medicine as told by your doctor. ? Do not stop taking medicine unless your doctor tells you to. ? Do not skip any dose of medicine. ? Refill your medicines before they run out. ? Take other medicines only as told by your doctor or pharmacist.  Stay active if told by your doctor. The elderly and people with severe heart failure should talk with a doctor about physical activity.  Eat heart-healthy foods. Choose foods that are without trans fat and are low in saturated fat, cholesterol, and salt (sodium). This includes fresh or frozen fruits and vegetables, fish, lean meats, fat-free or low-fat dairy foods, whole grains, and high-fiber foods.  Lentils and dried peas and beans (legumes) are also good choices.  Limit salt if told by your doctor.  Cook in a healthy way. Roast, grill, broil, bake, poach, steam, or stir-fry foods.  Limit fluids as told by your doctor.  Weigh yourself every morning. Do this after you pee (urinate) and before you eat breakfast. Write down your weight to give to your doctor.  Take your blood pressure and write it down if your  doctor tells you to.  Ask your doctor how to check your pulse. Check your pulse as told.  Lose weight if told by your doctor.  Stop smoking or chewing tobacco. Do not use gum or patches that help you quit without your doctor's approval.  Schedule and go to doctor visits as told.  Nonpregnant women should have no more than 1 drink a day. Men should have no more than 2 drinks a day. Talk to your doctor about drinking alcohol.  Stop illegal drug use.  Stay current with shots (immunizations).  Manage your health conditions as told by your doctor.  Learn to manage your stress.  Rest when you are tired.  If it is really hot outside: ? Avoid intense activities. ? Use air conditioning or fans, or get in a cooler place. ? Avoid caffeine and alcohol. ? Wear loose-fitting, lightweight, and light-colored clothing.  If it is really cold outside: ? Avoid intense activities. ? Layer your clothing. ? Wear mittens or gloves, a hat, and a scarf when going outside. ? Avoid alcohol.  Learn about heart failure and get support as needed.  Get help to maintain or improve your quality of life and your ability to care for yourself as needed. Contact a doctor if:  You gain weight quickly.  You are more short of breath than usual.  You cannot do your normal activities.  You tire easily.  You cough more than normal, especially with activity.  You have any or more puffiness (swelling) in areas such as your hands, feet, ankles, or belly (abdomen).  You cannot sleep because it is hard to breathe.  You feel like your heart is beating fast (palpitations).  You get dizzy or light-headed when you stand up. Get help right away if:  You have trouble breathing.  There is a change in mental status, such as becoming less alert or not being able to focus.  You have chest pain or discomfort.  You faint. This information is not intended to replace advice given to you by your health care  provider. Make sure you discuss any questions you have with your health care provider. Document Released: 03/03/2008 Document Revised: 10/31/2015 Document Reviewed: 07/11/2012 Elsevier Interactive Patient Education  2017 [employer].

## 2017-01-22 NOTE — Progress Notes (Signed)
Physical Therapy Treatment Patient Details Name: Louis Reid MRN: [id number] DOB: [birth year] Today's Date: 01/22/2017    History of Present Illness Pt is an 83 y/o male admitted secondary to chest tightness with exertion, and is s/p L heart cath. PMH includes STEMI s/p CABG X5, CAD, HTN, PE, a fib, and osteoporosis.     PT Comments    Pt progressing towards goals. Reports decreased weakness from previous session and able to progress ambulation distance. Practiced stair management and pt requiring min guard for safety, but demonstrated good technique. Current recommendations appropriate. Will continue to follow acutely to maximize functional mobility independence and safety.    Follow Up Recommendations  Home health PT (HHOT for safety eval )     Equipment Recommendations  None recommended by PT    Recommendations for Other Services       Precautions / Restrictions Precautions Precautions: Fall Restrictions Weight Bearing Restrictions: No    Mobility  Bed Mobility Overal bed mobility: Modified Independent             General bed mobility comments: Increased time, however, no assist required.   Transfers Overall transfer level: Needs assistance Equipment used: Rolling walker (2 wheeled) Transfers: Sit to/from Stand Sit to Stand: Supervision         General transfer comment: Supervision for safety. Verbal cues for safe hand placement.   Ambulation/Gait Ambulation/Gait assistance: Supervision Ambulation Distance (Feet): 200 Feet Assistive device: Rolling walker (2 wheeled) Gait Pattern/deviations: Step-through pattern;Decreased stride length;Trunk flexed Gait velocity: Decreased Gait velocity interpretation: Below normal speed for age/gender General Gait Details: Slow, but steady gait. Verbal cues for proximity to device. Pt reporting less weakness than yesterday and amble to increase ambulation distance.    Stairs Stairs: Yes   Stair Management: One rail  Right;Step to pattern;Forwards Number of Stairs: 4 General stair comments: Min guard for safety. Verbal cues for use of step to pattern to increase safety with stair navigation.   Wheelchair Mobility    Modified Rankin (Stroke Patients Only)       Balance Overall balance assessment: Needs assistance Sitting-balance support: No upper extremity supported;Feet supported Sitting balance-Leahy Scale: Good     Standing balance support: Bilateral upper extremity supported;During functional activity Standing balance-Leahy Scale: Poor Standing balance comment: Reliant on RW for stability                             Cognition Arousal/Alertness: Awake/alert Behavior During Therapy: WFL for tasks assessed/performed Overall Cognitive Status: Within Functional Limits for tasks assessed                                        Exercises      General Comments General comments (skin integrity, edema, etc.): Pt reports his son will be staying with him tonight to ensure he is ok at home       Pertinent Vitals/Pain Pain Assessment: No/denies pain    Home Living                      Prior Function            PT Goals (current goals can now be found in the care plan section) Acute Rehab PT Goals Patient Stated Goal: to be independent  PT Goal Formulation: With patient Time For Goal Achievement: 01/28/17 Potential to  Achieve Goals: Good Progress towards PT goals: Progressing toward goals    Frequency    Min 3X/week      PT Plan Current plan remains appropriate    Co-evaluation              AM-PAC PT "6 Clicks" Daily Activity  Outcome Measure  Difficulty turning over in bed (including adjusting bedclothes, sheets and blankets)?: None Difficulty moving from lying on back to sitting on the side of the bed? : None Difficulty sitting down on and standing up from a chair with arms (e.g., wheelchair, bedside commode, etc,.)?: None Help  needed moving to and from a bed to chair (including a wheelchair)?: None Help needed walking in hospital room?: None Help needed climbing 3-5 steps with a railing? : A Little 6 Click Score: 23    End of Session Equipment Utilized During Treatment: Gait belt Activity Tolerance: Patient tolerated treatment well Patient left: in chair;with call bell/phone within reach Nurse Communication: Mobility status PT Visit Diagnosis: Unsteadiness on feet (R26.81);Muscle weakness (generalized) (M62.81)     Time: 5993-5701 PT Time Calculation (min) (ACUTE ONLY): 23 min  Charges:  $Gait Training: 23-37 mins                    G Codes:       Louis Reid, PT, DPT  Acute Rehabilitation Services  Pager: [phone]    Louis Reid 01/22/2017, 2:00 PM

## 2017-01-22 NOTE — Care Management Important Message (Signed)
Important Message  Patient Details  Name: Louis Reid MRN: [id number] Date of Birth: 25-May-1933   Medicare Important Message Given:  Yes    [name] 01/22/2017, 11:03 AM

## 2017-01-22 NOTE — Care Management Note (Signed)
Case Management Note  Patient Details  Name: Louis Reid MRN: [id number] Date of Birth: 09-Sep-1932  Subjective/Objective: Pt presented for Unstable Angina. Pt is from home alone and has support of son. PT recommendations for [facility] PT- pt is declining services at this time. Son at the bedside at the time of conversation. PEr son he checks in on the patient weekly. Pt has neighbors that check in daily.                  Action/Plan: CM did discuss Life Alert with the patient and son. Pt is aware that if he needs services once he gets home to contact his PCP. Private Duty List was given to patient just in case additional assistance in the home. No further needs from CM at this time.   Expected Discharge Date:  01/22/17               Expected Discharge Plan:  Home/Self Care  In-House Referral:  NA  Discharge planning Services  CM Consult  Post Acute Care Choice:  NA Choice offered to:  NA  DME Arranged:  N/A DME Agency:  NA  HH Arranged:  Patient Refused [place] Agency:  NA  Status of Service:  Completed, signed off  If discussed at [place] of Stay Meetings, dates discussed:    Additional Comments:  Bethena Roys, RN 01/22/2017, 4:02 PM

## 2017-01-22 NOTE — Discharge Summary (Signed)
Discharge Summary    Patient ID: Louis Reid,  MRN: [id number], DOB/AGE: Nov 26, 1932 83 y.o.  Admit date: 01/19/2017 Discharge date: 01/22/2017  Primary Care Provider: [name] Primary Cardiologist:  Dr. Stanford Breed   Discharge Diagnoses    Principal Problem:   Unstable angina ([place]):  (01/20/2017) a. 3-v CAD w/ patent SVG to diag and patent sequential SVG to OM 2 and OM 3. Patent prox LAD stent. LIMA to LAD and SVG to RCA known occl. stable prox RCA sten  Active Problems:   Arterial hypotension   Nonspecific chest pain   Chronic combined systolic and diastolic heart failure (HCC)  Allergies Allergies  Allergen Reactions  . Cold Medicine Plus [Chlorphen-Pseudoephed-Apap]     Reaction a long time ago - made him feel goofy     History of Present Illness     Louis Reid has a past medical history of CAD (s/p remote CABG in 1996, b. AL STEMI c/b VF arrest in 05/2012 => LHC 05/10/12: Severe 3v CAD, S-OM1/2 ok, SVG-Dx ok, SVG-RCA occluded, LIMA-LAD atretic, proximal LAD occluded. EF 20% with anterior apical HK=> salvage PCI with POBA and thrombectomy of the occluded LAD;  c/b hypoxic encephalopathy), chronic systolic heart failure (Echo 12/13: EF 20-25%, anteroseptal, inferior, apical HK, mildly reduced RV function, trivial pericardial effusion;   b.  Echo 3/14:  Ant, septal, apical AK, EF 25%), hypertension, ischemic cardiomyopathy, paroxysmal atrial fibrillation, stage III CKD (basleine 1.2-1.3).  He presented to the Emergency department on August 14 for 3-4 weeks of weakness, progressive fatigue, hypotension, and chest pressure with exertion.  He has been following with Dr. Stanford Breed who has been reducing medications secondary to  hypotension. He has also had associated dizziness and lightheadedness, especially at home with standing. The patient reported his blood pressures to be as low as 74/55 at home. He started feeling somewhat better after his Lasix and Spironolactone  were discontinued by Dr. Stanford Breed. He eventually self elected to stop taking his Coreg and Lisinopril. He was also taken off of his 80 mg Lipitor for weakness. He had a stress perfusion study done recently that demonstrated severe intensity fixed perfusion defect of the anterior, septal, apical and inferoapical walls suggestive of extensive scar. No reversible ischemia-- largely unchanged from 2015.   Hospital Course     Consultants: None  Louis Reid had negative troponins and his EKG was without acute ischemic changes this admission. His blood pressure medications were held secondary to hypotension. He was scheduled for a cardiac catheterization to further evaluate his typical angina symptoms which revealed no change in his coronary disease.  He has had a long standing cardiomyopathy so his symptoms are more likely related to his hypotension than his heart failure. He continued to have borderline low - low blood pressures throughout this admission. We attempted to reintroduce a low-dose carvedilol. Unfortunately, he was only able to receive one dose of his due to low blood pressures, systolic 03K and this was discontinued. Because of hypotension we are unable to initiate ACE or ARB, as well as BB and aldosterone inhibitor. His EDP was normal on cath and he appears euvolemic on exam therefore aldosterone inhibitor and lasix will be held at discharge, this can be reconsidered at discharge.   We will reinitiate Lipitor 20 mg, since his weakness did not improve with this being held. We will continue his aspirin.  The etiology of his hypotension is currently unknown, this will need to be further explored as an  outpatient. He has a follow-up appointment scheduled with [place], [name] on August 27 as well as device check that same morning at the [facility] location.  The patient has had an uncomplicated hospital course and is recovering well. The radial catheter site is stable. He has been seen by Dr. Debara Pickett  today and deemed ready for discharge home. All follow-up appointments have been scheduled.  Discharge medications are listed below.  _____________  Discharge Vitals Blood pressure 99/69, pulse 65, temperature 98 F (36.7 C), temperature source Oral, resp. rate 16, height 5\' 11"  (1.803 m), weight 120 lb 3.2 oz (54.5 kg), SpO2 96 %.  Filed Weights   01/20/17 0500 01/21/17 0519 01/22/17 0449  Weight: 119 lb 3.2 oz (54.1 kg) 120 lb 4.8 oz (54.6 kg) 120 lb 3.2 oz (54.5 kg)   Physical Exam   GEN: Well nourished, well developed HEENT: normal  Neck: no JVD, carotid bruits, or masses Cardiac: RRR. no murmurs, rubs, or gallops,no edema. Intact distal pulses bilaterally.  Respiratory: clear to auscultation bilaterally, normal work of breathing GI: soft, nontender, nondistended, + BS MS: no deformity or atrophy, stable radial catheter site Skin: warm and dry, no rash Neuro: Alert and Oriented x 3, Strength and sensation are intact Psych:   Full affect   Labs & Radiologic Studies     CBC  Recent Labs  01/19/17 1825 01/20/17 0715  WBC 5.5 4.4  HGB 11.7* 10.4*  HCT 36.1* 31.7*  MCV 94.8 94.1  PLT 146* 270*   Basic Metabolic Panel  Recent Labs  01/21/17 0328 01/22/17 0412  NA 142 140  K 4.1 3.9  CL 112* 112*  CO2 22 24  GLUCOSE 95 92  BUN 23* 19  CREATININE 1.19 1.10  CALCIUM 8.3* 8.3*   Liver Function Tests  Recent Labs  01/20/17 0243  AST 21  ALT 22  ALKPHOS 41  BILITOT 0.9  PROT 4.9*  ALBUMIN 3.0*   No results for input(s): LIPASE, AMYLASE in the last 72 hours. Cardiac Enzymes  Recent Labs  01/20/17 0243 01/20/17 0715 01/20/17 1442  TROPONINI <0.03 <0.03 <0.03     Recent Labs  01/20/17 0243  TSH 3.123    Dg Chest 2 View  Result Date: 01/19/2017 CLINICAL DATA:  Chest pain EXAM: CHEST  2 VIEW COMPARISON:  09/14/2012 FINDINGS: Left-sided pacing device. Post sternotomy changes. Hyperinflation without focal infiltrate or effusion. Mild  cardiomegaly with atherosclerosis. Probable apical scarring on the right. No definitive pneumothorax. Osteopenia of the spine with stable mild compression deformities of the mid to lower thoracic spine. IMPRESSION: Hyperinflation without acute infiltrate.  Cardiomegaly. Electronically Signed   By: Donavan Foil M.D.   On: 01/19/2017 18:59     Diagnostic Studies/Procedures    01/20/2017 INTRAVASCULAR PRESSURE WIRE/FFR STUDY  LEFT HEART CATH AND CORS/GRAFTS ANGIOGRAPHY   Conclusion     Ost Cx to Prox Cx lesion, 80 %stenosed.  Prox Cx to Mid Cx lesion, 100 %stenosed.  Prox LAD to Dist LAD lesion, 20 %stenosed.  Dist LAD lesion, 30 %stenosed.  Prox RCA lesion, 70 %stenosed.  Prox RCA to Mid RCA lesion, 30 %stenosed.  Ost 2nd Diag to 2nd Diag lesion, 100 %stenosed.  SVG.  The graft exhibits mild .  SVG.  Origin to Prox Graft lesion, 30 %stenosed.  Mid Graft lesion, 20 %stenosed.  SVG graft was not injected.  Origin lesion, 100 %stenosed.   1.significant underlying three-vessel coronary artery disease with patent SVG to diagonal and patent sequential SVG to  OM 2 and OM 3. Patent proximal LAD stent. LIMA to LAD is known to be atretic and SVG to RCA is known to be occluded. His were not injected. 70% proximal RCA stenosis which is stable from most recent angiography in 2013. This was evaluated by pressure wire and was found to be not significant with an FFR ratio of 0.88.  2. Normal LV  Ventricular end-diastolic pressure. Left ventricular angiography was not performed given chronic kidney disease. EF is known to be severely reduced by echo.  Recommendations: I don't see a culprit for unstable angina. Continue medical therapy. It is possible some of his symptoms might be related to low cardiac output heart failure or some other noncardiac causes.    01/20/2017 Echocardiogram  Study Conclusions  - Left ventricle: The cavity size was mildly dilated. Wall   thickness was  normal. Systolic function was severely reduced. The   estimated ejection fraction was in the range of 20% to 25%.   Akinesis and aneurysmal deformity of the anteroseptal, anterior,   and apical myocardium; consistent with infarction in the   distribution of the left anterior descending coronary artery.   Doppler parameters are consistent with abnormal left ventricular   relaxation (grade 1 diastolic dysfunction). Acoustic contrast   opacification revealed no evidence ofthrombus. - Right ventricle: Systolic function was mildly reduced  _____________    Disposition   Pt is being discharged home today in good condition.  Follow-up Plans & Appointments    Follow-up Information    Erma Heritage, [name] Follow up.   Specialties:  Physician Assistant, Cardiology Contact information: [address] [place] [address] [place] [address] [facility] Follow up on 02/01/2017.   Why:  You have a device check appointment scheduled for August 27 at [address] location at [facility] information: [place] [address] [phone]           Discharge Medications   Allergies as of 01/22/2017      Reactions   Cold Medicine Plus [chlorphen-pseudoephed-apap]    Reaction a long time ago - made him feel goofy      Medication List    STOP taking these medications   carvedilol 6.25 MG tablet Commonly known as:  COREG   furosemide 20 MG tablet Commonly known as:  LASIX   lisinopril 2.5 MG tablet Commonly known as:  PRINIVIL,ZESTRIL     TAKE these medications   aspirin EC 81 MG tablet Take 81 mg by mouth daily.   atorvastatin 20 MG tablet Commonly known as:  LIPITOR Take 1 tablet (20 mg total) by mouth daily at 6 PM.   cholecalciferol 1000 units tablet Commonly known as:  VITAMIN D Take 1,000 Units by mouth daily.   docusate sodium 100 MG  capsule Commonly known as:  COLACE Take 100 mg by mouth 2 (two) times daily as needed for constipation.   ICAPS AREDS 2 PO Take 1 tablet by mouth daily.   nitroGLYCERIN 0.4 MG SL tablet Commonly known as:  NITROSTAT Place 1 tablet (0.4 mg total) under the tongue every 5 (five) minutes x 3 doses as needed for chest pain. Take only as needed for chest pain if your blood pressure is greater than 100/80.         Outstanding Labs/Studies     Duration of Discharge Encounter   Greater than 30 minutes including physician time.  Signed, Linus Mako [name] 01/22/2017, 11:11 AM

## 2017-01-22 NOTE — Progress Notes (Signed)
Patient discharged. Instructions given with son at bedside. Instructed on radial site care and what to look for. Also cardiologist stopped all BP meds. Patient aware to stop taking those and keep up with blood pressures. Is aware of follow-up appointments. Patient and son verbalized understanding and had no further questions. PIV removed. Escorted out via wheelchair by [employer] certified [employer].

## 2017-01-25 ENCOUNTER — Telehealth: Payer: Self-pay | Admitting: Cardiology

## 2017-01-25 NOTE — Telephone Encounter (Signed)
Patient calling, states that he has some information to relay to [place].

## 2017-01-25 NOTE — Telephone Encounter (Signed)
Forward to debra 

## 2017-01-25 NOTE — Telephone Encounter (Signed)
Spoke with pt, he called to report his bp since discharge 01-23-17 97/59, 101/64 and 88/46. 01-24-17 88/71, 105/63 and 111/74. 01-25-17 97/65 and 113/76. His nausea is better and he does feel a little stronger. Questions regarding fluid intake and blood pressure answered.

## 2017-01-31 NOTE — Progress Notes (Signed)
Cardiology Office Note    Date:  02/01/2017   ID:  Nikoli, Nasser [birth year], MRN [id number]  PCP:  [name], MD  Cardiologist: Dr. Stanford Breed  Chief Complaint  Patient presents with  . Hospitalization Follow-up    History of Present Illness:    Louis Reid is a 83 y.o. male with past medical history of CAD (s/p CABG in 1996 with STEMI in 05/2012 and cath showing patent SVG-OM1-OM2 and SVG-D1 with occluded SVG-RCA and atretic LIMA-LAD with thrombectomy of proximal LAD occlusion), HTN, HLD, ischemic cardiomyopathy (s/p ICD placement in 2014), and chronic combined systolic and diastolic CHF (EF 01% by echo in 2014)who presents to the office today for hospital follow-up.   He was recently admitted from 8/14 - 01/22/2017 for evaluation of chest pain along with dizziness and nausea. All of his cardiac medications had been discontinued (either by [facility] or himself) due to dizziness and hypotension. Cardiac enzymes remained negative and his EKG showed no acute ischemic changes. A recent NST showed significant scar with no reversible ischemia. A cardiac catheterization was recommended for definitive evaluation with his concerning symptoms. This was performed on 01/20/2017 and showed underlying 3-v CAD with patent SVG to diagonal and patent sequential SVG to OM 2 and OM 3. Patent proximal LAD stent. LIMA to LAD is known to be atretic and SVG to RCA is known to be occluded. 70% proximal RCA stenosis was noted but not significant with an FFR ratio of 0.88. Continued medical therapy was recommended. He was restarted on ASA and Atorvastatin at the time of discharge. There was an attempt to restart Coreg at a lower dose of 3.125mg  BID prior to discharge, but this brought his SBP to the 80's and therefore this was discontinued as well.  In talking with the patient today, he reports significant improvement in his dizziness and nausea since his recent hospitalization. He denies any  recurrent chest pressure or dyspnea on exertion. No recent orthopnea or PND. Has noticed mild lower extremity edema over the past week but denies any changes in his weight.   He has been checking his blood pressure 2-3 times per day with SBP being variable in the 80's to low-100's over 40's- 70's.   Past Medical History:  Diagnosis Date  . Anemia   . CAD (coronary artery disease)    a. s/p remote CABG, b. AL STEMI c/b VF arrest in 05/2012 => LHC 05/10/12: Severe 3v CAD, S-OM1/2 ok, SVG-Dx ok, SVG-RCA occluded, LIMA-LAD atretic, proximal LAD occluded. EF 20% with anterior apical HK=> salvage PCI with POBA and thrombectomy of the occluded LAD;  c. 01/2017: atretic LIMA_LAD and known occluded SVG-RCA. Patent LAD stent and 70% proxRCA stenosis (FFR 0.88).   . Cataract   . Chronic systolic heart failure ([place])    a. Echo 12/13: EF 20-25%, anteroseptal, inferior, apical HK, mildly reduced RV function, trivial pericardial effusion;   b.  Echo 3/14:  Ant, septal, apical AK, EF 25%  . Clotting disorder ([place])   . Depression   . HTN (hypertension)   . Inguinal hernia    "have one now on my left side" (09/13/2012)  . Ischemic cardiomyopathy   . Kidney stones   . Osteoporosis   . Paroxysmal atrial fibrillation ([place]) 03/17/2016   noted on device interrogation,  will follow burden  . Pectus excavatum   . Pulmonary embolism ([place])    a. after adhesiolysis for SBO in 04/2012 => coumadin for 6 mos  .  SBO (small bowel obstruction) [facility]) November 2013   s/p lysis of adhesions  . Sinoatrial node dysfunction (H[facility])   . Ventricular fibrillation ([place]) 05/2012   . in setting of AL STEMI 12/13 => EF 20-25% => d/c on LifeVest (repeat echo planned in 08/2012)    Past Surgical History:  Procedure Laterality Date  . COLONOSCOPY    . CORONARY ARTERY BYPASS GRAFT  1996   "CABG X5" (09/13/2012)  . CYSTOSCOPY/RETROGRADE/URETEROSCOPY/STONE EXTRACTION WITH BASKET  1990's  . ESOPHAGOGASTRODUODENOSCOPY  05/08/2012    Procedure: ESOPHAGOGASTRODUODENOSCOPY (EGD);  Surgeon: Juanita Craver, MD;  Location: [facility] ENDOSCOPY;  Service: Endoscopy;  Laterality: N/A;  Nevin Bloodgood put on for Dr. Collene Mares , Dr. Collene Mares will call if she wants another time  . IMPLANTABLE CARDIOVERTER DEFIBRILLATOR IMPLANT N/A 09/13/2012   SJM Ellipse ER implanted by Dr Rayann Heman for secondary prevention  . INGUINAL HERNIA REPAIR Right 1996  . INTRAVASCULAR PRESSURE WIRE/FFR STUDY N/A 01/20/2017   Procedure: INTRAVASCULAR PRESSURE WIRE/FFR STUDY;  Surgeon: Wellington Hampshire, MD;  Location: [facility] CV LAB;  Service: Cardiovascular;  Laterality: N/A;  . LAPAROTOMY  04/27/2012   Procedure: EXPLORATORY LAPAROTOMY;  Surgeon: Gwenyth Ober, MD;  Location: [facility];  Service: General;  Laterality: N/A;  . LEFT HEART CATH AND CORS/GRAFTS ANGIOGRAPHY N/A 01/20/2017   Procedure: LEFT HEART CATH AND CORS/GRAFTS ANGIOGRAPHY;  Surgeon: Wellington Hampshire, MD;  Location: [facility] CV LAB;  Service: Cardiovascular;  Laterality: N/A;  . LEFT HEART CATHETERIZATION WITH CORONARY ANGIOGRAM N/A 05/10/2012   Procedure: LEFT HEART CATHETERIZATION WITH CORONARY ANGIOGRAM;  Surgeon: Burnell Blanks, MD;  Location: [facility] CATH LAB;  Service: Cardiovascular;  Laterality: N/A;  . PTCA     percutaneous transluminal coronary intervention and brachy therapy, Bruce R. Olevia Perches, MD. EF 60%  . TRANSURETHRAL RESECTION OF PROSTATE      Current Medications: Outpatient Medications Prior to Visit  Medication Sig Dispense Refill  . aspirin EC 81 MG tablet Take 81 mg by mouth daily.    Marland Kitchen atorvastatin (LIPITOR) 20 MG tablet Take 1 tablet (20 mg total) by mouth daily at 6 PM. 30 tablet 3  . cholecalciferol (VITAMIN D) 1000 UNITS tablet Take 1,000 Units by mouth daily.    Marland Kitchen docusate sodium (COLACE) 100 MG capsule Take 100 mg by mouth 2 (two) times daily as needed for constipation.     . Multiple Vitamins-Minerals (ICAPS AREDS 2 PO) Take 1 tablet by mouth daily.    . nitroGLYCERIN (NITROSTAT) 0.4 MG SL  tablet Place 1 tablet (0.4 mg total) under the tongue every 5 (five) minutes x 3 doses as needed for chest pain. Take only as needed for chest pain if your blood pressure is greater than 100/80. 25 tablet 12   No facility-administered medications prior to visit.      Allergies:   Cold medicine plus [chlorphen-[id number]-apap]   Social History   Social History  . Marital status: Widowed    Spouse name: N/A  . Number of children: 1  . Years of education: N/A   Occupational History  . [name] Retired   Social History Main Topics  . Smoking status: Former Smoker    Types: Cigarettes  . Smokeless tobacco: Never Used     Comment: 09/14/2011 "quit smoking when I was ~ 17; probably didn't smoke 10 packs in my life"  . Alcohol use No  . Drug use: No  . Sexual activity: Not Currently   Other Topics Concern  . None  Social History Narrative  . None     Family History:  The patient's family history includes Cancer in his brother; Colon cancer in his brother; Heart disease in his brother and father; Other in his mother.   Review of Systems:   Please see the history of present illness.     General:  No chills, fever, night sweats or weight changes.  Cardiovascular:  No chest pain, dyspnea on exertion, orthopnea, palpitations, paroxysmal nocturnal dyspnea. Positive for edema.  Dermatological: No rash, lesions/masses Respiratory: No cough, dyspnea Urologic: No hematuria, dysuria Abdominal:   No nausea, vomiting, diarrhea, bright red blood per rectum, melena, or hematemesis Neurologic:  No visual changes, wkns, changes in mental status. Positive for dizziness (now improved).   All other systems reviewed and are otherwise negative except as noted above.   Physical Exam:    VS:  BP 122/74   Pulse 72   Ht 5\' 11"  (1.803 m)   Wt 131 lb (59.4 kg)   BMI 18.27 kg/m    General: Well developed, well nourished Caucasian male appearing in no acute distress. Head:  Normocephalic, atraumatic, sclera non-icteric, no xanthomas, nares are without discharge.  Neck: No carotid bruits. JVD not elevated.  Lungs: Respirations regular and unlabored, without wheezes or rales.  Heart: Regular rate and rhythm. No S3 or S4.  No murmur, no rubs, or gallops appreciated. Abdomen: Soft, non-tender, non-distended with normoactive bowel sounds. No hepatomegaly. No rebound/guarding. No obvious abdominal masses. Msk:  Strength and tone appear normal for age. No joint deformities or effusions. Extremities: No clubbing or cyanosis. Trace ankle edema bilaterally.  Distal pedal pulses are 2+ bilaterally. Neuro: Alert and oriented X 3. Moves all extremities spontaneously. No focal deficits noted. Psych:  Responds to questions appropriately with a normal affect. Skin: No rashes or lesions noted  Wt Readings from Last 3 Encounters:  02/01/17 131 lb (59.4 kg)  01/22/17 120 lb 3.2 oz (54.5 kg)  01/12/17 125 lb (56.7 kg)     Studies/Labs Reviewed:   EKG:  EKG is not ordered today.    Recent Labs: 01/20/2017: ALT 22; Hemoglobin 10.4; Platelets 112; TSH 3.123 01/22/2017: BUN 19; Creatinine, Ser 1.10; Potassium 3.9; Sodium 140   Lipid Panel    Component Value Date/Time   CHOL 89 (L) 08/24/2016 0931   TRIG 65 08/24/2016 0931   HDL 37 (L) 08/24/2016 0931   CHOLHDL 2.4 08/24/2016 0931   CHOLHDL 2.4 07/29/2015 0837   VLDL 15 07/29/2015 0837   LDLCALC 39 08/24/2016 0931    Additional studies/ records that were reviewed today include:   Cardiac Catheterization: 01/20/2017  Ost Cx to Prox Cx lesion, 80 %stenosed.  Prox Cx to Mid Cx lesion, 100 %stenosed.  Prox LAD to Dist LAD lesion, 20 %stenosed.  Dist LAD lesion, 30 %stenosed.  Prox RCA lesion, 70 %stenosed.  Prox RCA to Mid RCA lesion, 30 %stenosed.  Ost 2nd Diag to 2nd Diag lesion, 100 %stenosed.  SVG.  The graft exhibits mild .  SVG.  Origin to Prox Graft lesion, 30 %stenosed.  Mid Graft lesion, 20  %stenosed.  SVG graft was not injected.  Origin lesion, 100 %stenosed.  1.significant underlying three-vessel coronary artery disease with patent SVG to diagonal and patent sequential SVG to OM 2 and OM 3. Patent proximal LAD stent. LIMA to LAD is known to be atretic and SVG to RCA is known to be occluded. His were not injected. 70% proximal RCA stenosis which is stable from most recent  angiography in 2013. This was evaluated by pressure wire and was found to be not significant with an FFR ratio of 0.88.  2. Normal LV Ventricular end-diastolic pressure. Left ventricular angiography was not performed given chronic kidney disease. EF is known to be severely reduced by echo.  Recommendations: I don't see a culprit for unstable angina. Continue medical therapy. It is possible some of his symptoms might be related to low cardiac output heart failure or some other noncardiac causes.   Echocardiogram: 01/20/2017 Study Conclusions  - Left ventricle: The cavity size was mildly dilated. Wall   thickness was normal. Systolic function was severely reduced. The   estimated ejection fraction was in the range of 20% to 25%.   Akinesis and aneurysmal deformity of the anteroseptal, anterior,   and apical myocardium; consistent with infarction in the   distribution of the left anterior descending coronary artery.   Doppler parameters are consistent with abnormal left ventricular   relaxation (grade 1 diastolic dysfunction). Acoustic contrast   opacification revealed no evidence ofthrombus. - Right ventricle: Systolic function was mildly reduced.    Assessment:    1. Coronary artery disease involving coronary bypass graft of native heart without angina pectoris   2. Chronic combined systolic and diastolic heart failure ([place])   3. Ischemic cardiomyopathy   4. Hyperlipidemia LDL goal <70   5. Idiopathic hypotension      Plan:   In order of problems listed above:  1. CAD - s/p CABG in  1996 with STEMI in 05/2012 and cath showing patent SVG-OM1-OM2 and SVG-D1 with occluded SVG-RCA and atretic LIMA-LAD with thrombectomy of proximal LAD occlusion. Recent cardiac catheterization on 01/20/2017 showed underlying 3-v CAD with patent SVG to diagonal and patent sequential SVG to OM 2 and OM 3. Patent proximal LAD stent. LIMA to LAD is known to be atretic and SVG to RCA is known to be occluded. 70% proximal RCA stenosis was noted but not significant with an FFR ratio of 0.88. Continued medical therapy was recommended. - he denies any recurrent chest pressure or dyspnea on exertion.   - continue ASA and statin therapy.   2. Chronic Combined Systolic and Diastolic CHF - recent echo showed an EF of 20-25%, similar to prior imaging in 2014. - he does have mild lower extremity edema on examination. Denies any recent dyspnea or orthopnea and lungs are clear.  - will restart PRN Lasix 20mg  to take as needed for edema. Consider addition of Coreg 3.125mg  BID at follow-up if BP allows but hesitant to restart at this time with clear episodes of hypotension. No longer on ACE-I/ARB/ARNI secondary to hypotension.   3. Ischemic Cardiomyopathy - s/p ICD placement in 2014. - followed by Dr. Rayann Heman.   4. HLD - Lipid Panel in 08/2016 showed total cholesterol of 89, HDL 37, and LDL 39. At goal of LDL < 70. - continue Atorvastatin 20mg  daily.   5. Hypotension - While BP is at 122/74 during today's visit, he brings with him a detailed record of BP recordings with SBP in the 80's to low-100's. Not currently on any anti-hypertensive medications.    Medication Adjustments/Labs and Tests Ordered: Current medicines are reviewed at length with the patient today.  Concerns regarding medicines are outlined above.  Medication changes, Labs and Tests ordered today are listed in the Patient Instructions below. Patient Instructions  Medication Instructions:  Take Lasix (furosemide) 20mg  or 1 tablet AS NEEDED FOR  SWELLING  Follow-Up: Your physician recommends that you schedule  a follow-up appointment in: 4-6 WEEKS with Dr. Stanford Breed (or Bernerd Pho PA if needed)  Any Other Special Instructions Will Be Listed Below (If Applicable).  If you need a refill on your cardiac medications before your next appointment, please call your pharmacy.    Signed, Erma Heritage, [name]  02/01/2017 7:55 PM    [facility] [address], [place], [place]  [address] Phone: [phone]; Fax: [phone]  [address] [place], [place] [address] Phone: [phone]

## 2017-02-01 ENCOUNTER — Ambulatory Visit (INDEPENDENT_AMBULATORY_CARE_PROVIDER_SITE_OTHER): Payer: Medicare Other | Admitting: Student

## 2017-02-01 ENCOUNTER — Encounter: Payer: Self-pay | Admitting: Student

## 2017-02-01 VITALS — BP 122/74 | HR 72 | Ht 71.0 in | Wt 131.0 lb

## 2017-02-01 DIAGNOSIS — I5042 Chronic combined systolic (congestive) and diastolic (congestive) heart failure: Secondary | ICD-10-CM

## 2017-02-01 DIAGNOSIS — I25810 Atherosclerosis of coronary artery bypass graft(s) without angina pectoris: Secondary | ICD-10-CM | POA: Diagnosis not present

## 2017-02-01 DIAGNOSIS — I950 Idiopathic hypotension: Secondary | ICD-10-CM

## 2017-02-01 DIAGNOSIS — I255 Ischemic cardiomyopathy: Secondary | ICD-10-CM

## 2017-02-01 DIAGNOSIS — E785 Hyperlipidemia, unspecified: Secondary | ICD-10-CM

## 2017-02-01 MED ORDER — FUROSEMIDE 20 MG PO TABS: ORAL_TABLET | ORAL | 3 refills | Status: DC

## 2017-02-01 NOTE — Patient Instructions (Signed)
Medication Instructions:  Take Lasix (furosemide) 20mg  or 1 tablet AS NEEDED FOR SWELLING  Follow-Up: Your physician recommends that you schedule a follow-up appointment in: 4-6 WEEKS with Dr. Stanford Breed (or Bernerd Pho PA if needed)   Any Other Special Instructions Will Be Listed Below (If Applicable).     If you need a refill on your cardiac medications before your next appointment, please call your pharmacy.

## 2017-02-15 ENCOUNTER — Ambulatory Visit (INDEPENDENT_AMBULATORY_CARE_PROVIDER_SITE_OTHER): Payer: Medicare Other | Admitting: *Deleted

## 2017-02-15 ENCOUNTER — Ambulatory Visit: Payer: Medicare Other | Admitting: Cardiology

## 2017-02-15 DIAGNOSIS — I255 Ischemic cardiomyopathy: Secondary | ICD-10-CM | POA: Diagnosis not present

## 2017-02-15 DIAGNOSIS — I5042 Chronic combined systolic (congestive) and diastolic (congestive) heart failure: Secondary | ICD-10-CM

## 2017-02-15 LAB — CUP PACEART INCLINIC DEVICE CHECK
Battery Remaining Longevity: 57 mo
Brady Statistic RA Percent Paced: 30 %
Brady Statistic RV Percent Paced: 0.53 %
Date Time Interrogation Session: 20180910103405
HIGH POWER IMPEDANCE MEASURED VALUE: 57.375
Implantable Lead Implant Date: 20140408
Implantable Lead Location: 753859
Implantable Lead Location: 753860
Implantable Pulse Generator Implant Date: 20140408
Lead Channel Impedance Value: 337.5 Ohm
Lead Channel Impedance Value: 400 Ohm
Lead Channel Pacing Threshold Amplitude: 0.75 V
Lead Channel Pacing Threshold Pulse Width: 0.5 ms
Lead Channel Pacing Threshold Pulse Width: 0.5 ms
Lead Channel Sensing Intrinsic Amplitude: 11.7 mV
Lead Channel Setting Pacing Amplitude: 2 V
Lead Channel Setting Sensing Sensitivity: 0.5 mV
MDC IDC LEAD IMPLANT DT: 20140408
MDC IDC MSMT LEADCHNL RA PACING THRESHOLD AMPLITUDE: 0.75 V
MDC IDC MSMT LEADCHNL RA PACING THRESHOLD PULSEWIDTH: 0.5 ms
MDC IDC MSMT LEADCHNL RA SENSING INTR AMPL: 3.4 mV
MDC IDC MSMT LEADCHNL RV PACING THRESHOLD AMPLITUDE: 0.75 V
MDC IDC MSMT LEADCHNL RV PACING THRESHOLD AMPLITUDE: 0.75 V
MDC IDC MSMT LEADCHNL RV PACING THRESHOLD PULSEWIDTH: 0.5 ms
MDC IDC SET LEADCHNL RV PACING AMPLITUDE: 2.5 V
MDC IDC SET LEADCHNL RV PACING PULSEWIDTH: 0.5 ms
Pulse Gen Serial Number: 7094248

## 2017-02-15 NOTE — Progress Notes (Signed)
ICD check in clinic. Normal device function. Thresholds and sensing consistent with previous device measurements. Impedance trends stable over time. (2) "VT-NS" episodes, max dur. 4sec-- AT per EGMs. (16) mode switches, max dur. 12sec--AT. Histogram distribution appropriate for patient and level of activity. Stable thoracic impedance at present. No changes made this session. Device programmed at appropriate safety margins. Device programmed to optimize intrinsic conduction. Estimated longevity 4.0-4.8 years. Pt will follow up with DC in 3 months. Patient education completed including shock plan. Vibration demonstrated for patient.

## 2017-03-14 NOTE — Progress Notes (Signed)
Cardiology Office Note    Date:  03/15/2017   ID:  Louis Reid, DOB Dec 25, 1932, MRN [id number]  PCP:  [name], MD  Cardiologist: Dr. Stanford Breed  Chief Complaint  Patient presents with  . Follow-up    6 weeks    History of Present Illness:    Louis Reid is a 83 y.o. male with past medical history of CAD (s/p CABG in 1996 with STEMI in 05/2012 and cath showing patent SVG-OM1-OM2 and SVG-D1 with occluded SVG-RCA and atretic LIMA-LAD with thrombectomy of proximal LAD occlusion, no significant changes on cath in 01/2017), HTN, HLD, ischemic cardiomyopathy (s/p ICD placement in 2014), and chronic combined systolic and diastolic CHF (EF 77-82% by echo in 01/2017)who presents to the office today for 6-week follow-up.   He was last examined by myself in 01/2017 for hospital follow-up regarding a recent admission for chest pain. A cardiac catheterization had been obtained during admission and showed no significant findings when compared to his prior cath. He did have a 70% Prox-RCA stenosis but this was not significant by FFR. During his admission, many of his medications for ischemic cardiomyopathy were reduced or discontinued secondary to hypotension. At the time of his follow-up, he reported SBP being variable in the 80's to low-100's when checked at home. He did have lower extremity edema at that time and was restarted on PRN Lasix 20mg  daily with plans to restart Coreg 3.125mg  BID at his next appointment if BP allowed.  In talking with the patient today, he reports doing well since his last office visit. He reports significant improvement in his appetite and overall energy level. He has been checking blood pressure regularly at home and brings in a detailed log with SBP mostly in the low 100's to 423'N and diastolics in the 36'R to 44'R. Heart rate has been mostly stable in the 70's to 80's.  He denies any recent chest discomfort, dyspnea on exertion, orthopnea, PND, or  palpitations. He does have lower extremity edema which occurs regularly but improves after administration of Lasix.    Past Medical History:  Diagnosis Date  . Anemia   . CAD (coronary artery disease)    a. s/p remote CABG, b. AL STEMI c/b VF arrest in 05/2012 => LHC 05/10/12: Severe 3v CAD, S-OM1/2 ok, SVG-Dx ok, SVG-RCA occluded, LIMA-LAD atretic, proximal LAD occluded. EF 20% with anterior apical HK=> salvage PCI with POBA and thrombectomy of the occluded LAD;  c. 01/2017: atretic LIMA_LAD and known occluded SVG-RCA. Patent LAD stent and 70% proxRCA stenosis (FFR 0.88).   . Cataract   . Chronic systolic heart failure ([place])    a. Echo 12/13: EF 20-25%, anteroseptal, inferior, apical HK, mildly reduced RV function, trivial pericardial effusion;   b.  Echo 3/14:  Ant, septal, apical AK, EF 25%  . Clotting disorder ([place])   . Depression   . HTN (hypertension)   . Inguinal hernia    "have one now on my left side" (09/13/2012)  . Ischemic cardiomyopathy   . Kidney stones   . Osteoporosis   . Paroxysmal atrial fibrillation ([place]) 03/17/2016   noted on device interrogation,  will follow burden  . Pectus excavatum   . Pulmonary embolism ([place])    a. after adhesiolysis for SBO in 04/2012 => coumadin for 6 mos  . SBO (small bowel obstruction) [facility]) November 2013   s/p lysis of adhesions  . Sinoatrial node dysfunction (HCC)   . Ventricular fibrillation ([place]) 05/2012   .  in setting of AL STEMI 12/13 => EF 20-25% => d/c on LifeVest (repeat echo planned in 08/2012)    Past Surgical History:  Procedure Laterality Date  . COLONOSCOPY    . CORONARY ARTERY BYPASS GRAFT  1996   "CABG X5" (09/13/2012)  . CYSTOSCOPY/RETROGRADE/URETEROSCOPY/STONE EXTRACTION WITH BASKET  1990's  . ESOPHAGOGASTRODUODENOSCOPY  05/08/2012   Procedure: ESOPHAGOGASTRODUODENOSCOPY (EGD);  Surgeon: Juanita Craver, MD;  Location: [facility] ENDOSCOPY;  Service: Endoscopy;  Laterality: N/A;  Nevin Bloodgood put on for Dr. Collene Mares , Dr. Collene Mares will call if she  wants another time  . IMPLANTABLE CARDIOVERTER DEFIBRILLATOR IMPLANT N/A 09/13/2012   SJM Ellipse ER implanted by Dr Rayann Heman for secondary prevention  . INGUINAL HERNIA REPAIR Right 1996  . INTRAVASCULAR PRESSURE WIRE/FFR STUDY N/A 01/20/2017   Procedure: INTRAVASCULAR PRESSURE WIRE/FFR STUDY;  Surgeon: Wellington Hampshire, MD;  Location: [place] CV LAB;  Service: Cardiovascular;  Laterality: N/A;  . LAPAROTOMY  04/27/2012   Procedure: EXPLORATORY LAPAROTOMY;  Surgeon: Gwenyth Ober, MD;  Location: [place];  Service: General;  Laterality: N/A;  . LEFT HEART CATH AND CORS/GRAFTS ANGIOGRAPHY N/A 01/20/2017   Procedure: LEFT HEART CATH AND CORS/GRAFTS ANGIOGRAPHY;  Surgeon: Wellington Hampshire, MD;  Location: [place] CV LAB;  Service: Cardiovascular;  Laterality: N/A;  . LEFT HEART CATHETERIZATION WITH CORONARY ANGIOGRAM N/A 05/10/2012   Procedure: LEFT HEART CATHETERIZATION WITH CORONARY ANGIOGRAM;  Surgeon: Burnell Blanks, MD;  Location: [facility] CATH LAB;  Service: Cardiovascular;  Laterality: N/A;  . PTCA     percutaneous transluminal coronary intervention and brachy therapy, Bruce R. Olevia Perches, MD. EF 60%  . TRANSURETHRAL RESECTION OF PROSTATE      Current Medications: Outpatient Medications Prior to Visit  Medication Sig Dispense Refill  . aspirin EC 81 MG tablet Take 81 mg by mouth daily.    Marland Kitchen atorvastatin (LIPITOR) 20 MG tablet Take 1 tablet (20 mg total) by mouth daily at 6 PM. 30 tablet 3  . cholecalciferol (VITAMIN D) 1000 UNITS tablet Take 1,000 Units by mouth daily.    Marland Kitchen docusate sodium (COLACE) 100 MG capsule Take 100 mg by mouth 2 (two) times daily as needed for constipation.     . furosemide (LASIX) 20 MG tablet Take 20mg  (1 tablet) as needed for swelling (Patient taking differently: Take 20 mg by mouth daily. Take 20mg  (1 tablet) as needed for swelling) 30 tablet 3  . Multiple Vitamins-Minerals (ICAPS AREDS 2 PO) Take 1 tablet by mouth daily.    . nitroGLYCERIN (NITROSTAT) 0.4 MG  SL tablet Place 1 tablet (0.4 mg total) under the tongue every 5 (five) minutes x 3 doses as needed for chest pain. Take only as needed for chest pain if your blood pressure is greater than 100/80. 25 tablet 12   No facility-administered medications prior to visit.      Allergies:   Cold medicine plus [[id number]-[id number]]   Social History   Social History  . Marital status: Widowed    Spouse name: N/A  . Number of children: 1  . Years of education: N/A   Occupational History  . [name] Retired   Social History Main Topics  . Smoking status: Former Smoker    Types: Cigarettes  . Smokeless tobacco: Never Used     Comment: 09/14/2011 "quit smoking when I was ~ 17; probably didn't smoke 10 packs in my life"  . Alcohol use No  . Drug use: No  . Sexual activity: Not Currently   Other  Topics Concern  . None   Social History Narrative  . None     Family History:  The patient's family history includes Cancer in his brother; Colon cancer in his brother; Heart disease in his brother and father; Other in his mother.   Review of Systems:   Please see the history of present illness.     General:  No chills, fever, night sweats or weight changes.  Cardiovascular:  No chest pain, dyspnea on exertion, orthopnea, palpitations, paroxysmal nocturnal dyspnea. Positive for lower extremity edema. Dermatological: No rash, lesions/masses Respiratory: No cough, dyspnea Urologic: No hematuria, dysuria Abdominal:   No nausea, vomiting, diarrhea, bright red blood per rectum, melena, or hematemesis Neurologic:  No visual changes, wkns, changes in mental status. Positive for dizziness (improving).   All other systems reviewed and are otherwise negative except as noted above.   Physical Exam:    VS:  BP 135/72   Pulse 73   Ht 5\' 11"  (1.803 m)   Wt 127 lb 6.4 oz (57.8 kg)   BMI 17.77 kg/m    General: Well developed, elderly Caucasian male appearing in no acute  distress. Head: Normocephalic, atraumatic, sclera non-icteric, no xanthomas, nares are without discharge.  Neck: No carotid bruits. JVD not elevated.  Lungs: Respirations regular and unlabored, without wheezes or rales.  Heart: Regular rate and rhythm. No S3 or S4.  No murmur, no rubs, or gallops appreciated. Abdomen: Soft, non-tender, non-distended with normoactive bowel sounds. No hepatomegaly. No rebound/guarding. No obvious abdominal masses. Msk:  Strength and tone appear normal for age. No joint deformities or effusions. Extremities: No clubbing or cyanosis. No edema.  Distal pedal pulses are 2+ bilaterally. Neuro: Alert and oriented X 3. Moves all extremities spontaneously. No focal deficits noted. Psych:  Responds to questions appropriately with a normal affect. Skin: No rashes or lesions noted  Wt Readings from Last 3 Encounters:  03/15/17 127 lb 6.4 oz (57.8 kg)  02/01/17 131 lb (59.4 kg)  01/22/17 120 lb 3.2 oz (54.5 kg)     Studies/Labs Reviewed:   EKG:  EKG is not ordered today.   Recent Labs: 01/20/2017: ALT 22; Hemoglobin 10.4; Platelets 112; TSH 3.123 03/15/2017: BUN 26; Creatinine, Ser 1.30; Potassium 4.5; Sodium 143   Lipid Panel    Component Value Date/Time   CHOL 89 (L) 08/24/2016 0931   TRIG 65 08/24/2016 0931   HDL 37 (L) 08/24/2016 0931   CHOLHDL 2.4 08/24/2016 0931   CHOLHDL 2.4 07/29/2015 0837   VLDL 15 07/29/2015 0837   LDLCALC 39 08/24/2016 0931    Additional studies/ records that were reviewed today include:   Cardiac Catheterization: 01/2017  Ost Cx to Prox Cx lesion, 80 %stenosed.  Prox Cx to Mid Cx lesion, 100 %stenosed.  Prox LAD to Dist LAD lesion, 20 %stenosed.  Dist LAD lesion, 30 %stenosed.  Prox RCA lesion, 70 %stenosed.  Prox RCA to Mid RCA lesion, 30 %stenosed.  Ost 2nd Diag to 2nd Diag lesion, 100 %stenosed.  SVG.  The graft exhibits mild .  SVG.  Origin to Prox Graft lesion, 30 %stenosed.  Mid Graft lesion, 20  %stenosed.  SVG graft was not injected.  Origin lesion, 100 %stenosed.   1.significant underlying three-vessel coronary artery disease with patent SVG to diagonal and patent sequential SVG to OM 2 and OM 3. Patent proximal LAD stent. LIMA to LAD is known to be atretic and SVG to RCA is known to be occluded. His were not injected. 70% proximal  RCA stenosis which is stable from most recent angiography in 2013. This was evaluated by pressure wire and was found to be not significant with an FFR ratio of 0.88.  2. Normal LV  Ventricular end-diastolic pressure. Left ventricular angiography was not performed given chronic kidney disease. EF is known to be severely reduced by echo.  Recommendations: I don't see a culprit for unstable angina. Continue medical therapy. It is possible some of his symptoms might be related to low cardiac output heart failure or some other noncardiac causes.  Echocardiogram: 01/2017 Study Conclusions  - Left ventricle: The cavity size was mildly dilated. Wall   thickness was normal. Systolic function was severely reduced. The   estimated ejection fraction was in the range of 20% to 25%.   Akinesis and aneurysmal deformity of the anteroseptal, anterior,   and apical myocardium; consistent with infarction in the   distribution of the left anterior descending coronary artery.   Doppler parameters are consistent with abnormal left ventricular   relaxation (grade 1 diastolic dysfunction). Acoustic contrast   opacification revealed no evidence ofthrombus. - Right ventricle: Systolic function was mildly reduced.   Assessment:    1. Chronic combined systolic and diastolic heart failure ([place])   2. Ischemic cardiomyopathy   3. Coronary artery disease involving native coronary artery of native heart without angina pectoris   4. Hyperlipidemia LDL goal <70   5. Medication management      Plan:   In order of problems listed above:  1. Chronic Combined Systolic and  Diastolic CHF - EF 01-02% by most recent echocardiogram. Does report lower extremity edema but denies any dyspnea on exertion, orthopnea, or PND. He does not appear volume overloaded by physical examination.  - continue Lasix 20mg  daily. Check BMET today to assess kidney function and K+ levels. He is very hesitant to restart his prior medications for his cardiomyopathy and we had a lengthy discussion about why these medications are utilized. He is in agreement to restart Coreg at a lower dose of 3.125mg  BID. Encouraged the patient to follow BP closely as this along with his Lisinopril and Spironolactone were recently discontinued secondary to symptomatic hypotension.   2. Ischemic Cardiomyopathy - s/p St. Jude ICD placement. Followed by Dr. Rayann Heman.   3. CAD - s/p CABG in 1996 with STEMI in 05/2012 and cath showing patent SVG-OM1-OM2 and SVG-D1 with occluded SVG-RCA and atretic LIMA-LAD with thrombectomy of proximal LAD occlusion. Most recent cath in 01/2017 showed no significant changes.  - he denies any recurrent anginal symptoms.  - continue ASA and statin therapy. Restart BB as above.   4. HLD - Lipid Panel in 08/2016 showed total cholesterol of 89, HDL 37, and LDL 39. At goal of LDL < 70. - continue Atorvastatin 20mg  daily.   Medication Adjustments/Labs and Tests Ordered: Current medicines are reviewed at length with the patient today.  Concerns regarding medicines are outlined above.  Medication changes, Labs and Tests ordered today are listed in the Patient Instructions below. Patient Instructions  Medication Instructions: START Carvedilol 3.125 mg twice daily.  Labwork: Your physician recommends that you return for lab work today--BMET  Follow-Up: Your physician wants you to follow-up in: 6 months with Dr. Stanford Breed. You will receive a reminder letter in the mail two months in advance. If you don't receive a letter, please call our office to schedule the follow-up appointment.  If  you need a refill on your cardiac medications before your next appointment, please call your pharmacy.  Signed, Erma Heritage, [name]  03/15/2017 5:59 PM    [facility] [place], [place], [place]  [address] Phone: [phone]; Fax: [phone]  [address] [place], [place] [address] Phone: [phone]

## 2017-03-15 ENCOUNTER — Encounter: Payer: Self-pay | Admitting: Student

## 2017-03-15 ENCOUNTER — Ambulatory Visit (INDEPENDENT_AMBULATORY_CARE_PROVIDER_SITE_OTHER): Payer: Medicare Other | Admitting: Student

## 2017-03-15 VITALS — BP 135/72 | HR 73 | Ht 71.0 in | Wt 127.4 lb

## 2017-03-15 DIAGNOSIS — I5042 Chronic combined systolic (congestive) and diastolic (congestive) heart failure: Secondary | ICD-10-CM | POA: Diagnosis not present

## 2017-03-15 DIAGNOSIS — Z79899 Other long term (current) drug therapy: Secondary | ICD-10-CM

## 2017-03-15 DIAGNOSIS — I2510 Atherosclerotic heart disease of native coronary artery without angina pectoris: Secondary | ICD-10-CM | POA: Diagnosis not present

## 2017-03-15 DIAGNOSIS — E785 Hyperlipidemia, unspecified: Secondary | ICD-10-CM | POA: Diagnosis not present

## 2017-03-15 DIAGNOSIS — I255 Ischemic cardiomyopathy: Secondary | ICD-10-CM

## 2017-03-15 LAB — BASIC METABOLIC PANEL
BUN/Creatinine Ratio: 20 (ref 10–24)
BUN: 26 mg/dL (ref 8–27)
CALCIUM: 9.1 mg/dL (ref 8.6–10.2)
CHLORIDE: 106 mmol/L (ref 96–106)
CO2: 23 mmol/L (ref 20–29)
Creatinine, Ser: 1.3 mg/dL — ABNORMAL HIGH (ref 0.76–1.27)
GFR calc Af Amer: 59 mL/min/{1.73_m2} — ABNORMAL LOW (ref >59)
GFR calc non Af Amer: 51 mL/min/{1.73_m2} — ABNORMAL LOW (ref >59)
GLUCOSE: 76 mg/dL (ref 65–99)
POTASSIUM: 4.5 mmol/L (ref 3.5–5.2)
Sodium: 143 mmol/L (ref 134–144)

## 2017-03-15 MED ORDER — CARVEDILOL 3.125 MG PO TABS: 3.1250 mg | ORAL_TABLET | Freq: Two times a day (BID) | ORAL | 1 refills | Status: DC

## 2017-03-15 NOTE — Patient Instructions (Signed)
Medication Instructions: START Carvedilol 3.125 mg twice daily.   Labwork: Your physician recommends that you return for lab work today--BMET  Follow-Up: Your physician wants you to follow-up in: 6 months with Dr. Stanford Breed. You will receive a reminder letter in the mail two months in advance. If you don't receive a letter, please call our office to schedule the follow-up appointment.  If you need a refill on your cardiac medications before your next appointment, please call your pharmacy.

## 2017-03-24 ENCOUNTER — Telehealth: Payer: Self-pay | Admitting: Cardiology

## 2017-03-24 MED ORDER — FUROSEMIDE 20 MG PO TABS: ORAL_TABLET | ORAL | 1 refills | Status: DC

## 2017-03-24 MED ORDER — ATORVASTATIN CALCIUM 20 MG PO TABS: 20.0000 mg | ORAL_TABLET | Freq: Every day | ORAL | 1 refills | Status: DC

## 2017-03-24 NOTE — Telephone Encounter (Signed)
Rx(s) sent to mail order pharmacy electronically.

## 2017-03-24 NOTE — Telephone Encounter (Signed)
New message   [phone] reference [phone] fax     Pt c/o medication issue:  1. Name of Medication:   atorvastatin (LIPITOR) 20 MG tablet Take 1 tablet (20 mg total) by mouth daily at 6 PM.   furosemide (LASIX) 20 MG tablet Take 20mg  (1 tablet) as needed for swelling Patient taking differently: Take 20 mg by mouth daily. Take 20mg  (1 tablet) as needed for swelling     2. How are you currently taking this medication (dosage and times per day)?   3. Are you having a reaction (difficulty breathing--STAT)? no  4. What is your medication issue?  Needs authorizations for medication sent in.

## 2017-04-14 ENCOUNTER — Other Ambulatory Visit: Payer: Self-pay | Admitting: *Deleted

## 2017-04-14 MED ORDER — ATORVASTATIN CALCIUM 20 MG PO TABS
20.0000 mg | ORAL_TABLET | Freq: Every day | ORAL | 1 refills | Status: DC
Start: 2017-04-14 — End: 2017-04-16

## 2017-04-16 ENCOUNTER — Other Ambulatory Visit: Payer: Self-pay | Admitting: *Deleted

## 2017-04-16 MED ORDER — ATORVASTATIN CALCIUM 20 MG PO TABS: 20.0000 mg | ORAL_TABLET | Freq: Every day | ORAL | 1 refills | Status: DC

## 2017-04-20 ENCOUNTER — Other Ambulatory Visit: Payer: Self-pay | Admitting: *Deleted

## 2017-04-20 MED ORDER — CARVEDILOL 3.125 MG PO TABS: 3.1250 mg | ORAL_TABLET | Freq: Two times a day (BID) | ORAL | 3 refills | Status: DC

## 2017-05-24 ENCOUNTER — Ambulatory Visit (INDEPENDENT_AMBULATORY_CARE_PROVIDER_SITE_OTHER): Payer: Medicare Other | Admitting: *Deleted

## 2017-05-24 DIAGNOSIS — I5042 Chronic combined systolic (congestive) and diastolic (congestive) heart failure: Secondary | ICD-10-CM

## 2017-05-24 DIAGNOSIS — I469 Cardiac arrest, cause unspecified: Secondary | ICD-10-CM

## 2017-05-24 DIAGNOSIS — Z95810 Presence of automatic (implantable) cardiac defibrillator: Secondary | ICD-10-CM | POA: Diagnosis not present

## 2017-05-24 LAB — CUP PACEART INCLINIC DEVICE CHECK
Battery Remaining Longevity: 56 mo
Brady Statistic RA Percent Paced: 22 %
HighPow Impedance: 60 Ohm
Implantable Lead Implant Date: 20140408
Implantable Lead Location: 753859
Implantable Pulse Generator Implant Date: 20140408
Lead Channel Impedance Value: 412.5 Ohm
Lead Channel Pacing Threshold Amplitude: 0.5 V
Lead Channel Pacing Threshold Amplitude: 1 V
Lead Channel Pacing Threshold Pulse Width: 0.5 ms
Lead Channel Setting Pacing Amplitude: 2.5 V
Lead Channel Setting Pacing Pulse Width: 0.5 ms
MDC IDC LEAD IMPLANT DT: 20140408
MDC IDC LEAD LOCATION: 753860
MDC IDC MSMT LEADCHNL RA SENSING INTR AMPL: 2.8 mV
MDC IDC MSMT LEADCHNL RV IMPEDANCE VALUE: 337.5 Ohm
MDC IDC MSMT LEADCHNL RV PACING THRESHOLD PULSEWIDTH: 0.5 ms
MDC IDC MSMT LEADCHNL RV SENSING INTR AMPL: 11.7 mV
MDC IDC SESS DTM: 20181217100428
MDC IDC SET LEADCHNL RA PACING AMPLITUDE: 2 V
MDC IDC SET LEADCHNL RV SENSING SENSITIVITY: 0.5 mV
MDC IDC STAT BRADY RV PERCENT PACED: 0.37 %
Pulse Gen Serial Number: 7094248

## 2017-05-24 NOTE — Progress Notes (Signed)
ICD check in clinic. Normal device function. Thresholds and sensing consistent with previous device measurements. Impedance trends stable over time. 2 NSVT episodes--AT per EGMs, longest 4sec. 35 mode switches (<1%)--AT per EGMs, longest 58sec. Histogram distribution appropriate for patient and level of activity. Thoracic impedance stable at present. Device programmed at appropriate safety margins. Device programmed to optimize intrinsic conduction. Cybersecurity software upgrade delivered today. Estimated longevity 3.8-4.7 years. Patient education completed including shock plan. Alert vibration demonstrated for patient. ROV with Device Clinic on 08/23/17 and ROV with JA on 11/08/17.

## 2017-08-02 ENCOUNTER — Other Ambulatory Visit: Payer: Self-pay | Admitting: Cardiology

## 2017-08-03 ENCOUNTER — Ambulatory Visit: Payer: Medicare Other | Admitting: Cardiology

## 2017-08-06 ENCOUNTER — Other Ambulatory Visit: Payer: Self-pay | Admitting: Cardiology

## 2017-08-09 NOTE — Telephone Encounter (Signed)
Rx(s) sent to pharmacy electronically.  

## 2017-08-23 ENCOUNTER — Ambulatory Visit (INDEPENDENT_AMBULATORY_CARE_PROVIDER_SITE_OTHER): Payer: Medicare Other | Admitting: *Deleted

## 2017-08-23 DIAGNOSIS — I469 Cardiac arrest, cause unspecified: Secondary | ICD-10-CM

## 2017-08-23 LAB — CUP PACEART INCLINIC DEVICE CHECK
Battery Remaining Longevity: 55 mo
Brady Statistic RA Percent Paced: 22 %
Date Time Interrogation Session: 20190318094702
HighPow Impedance: 56.25 Ohm
Implantable Lead Implant Date: 20140408
Implantable Lead Location: 753859
Implantable Pulse Generator Implant Date: 20140408
Lead Channel Impedance Value: 412.5 Ohm
Lead Channel Pacing Threshold Amplitude: 0.5 V
Lead Channel Pacing Threshold Amplitude: 0.5 V
Lead Channel Pacing Threshold Amplitude: 0.75 V
Lead Channel Pacing Threshold Amplitude: 0.75 V
Lead Channel Pacing Threshold Pulse Width: 0.5 ms
Lead Channel Pacing Threshold Pulse Width: 0.5 ms
Lead Channel Pacing Threshold Pulse Width: 0.5 ms
Lead Channel Sensing Intrinsic Amplitude: 3 mV
Lead Channel Setting Pacing Amplitude: 2.5 V
Lead Channel Setting Pacing Pulse Width: 0.5 ms
Lead Channel Setting Sensing Sensitivity: 0.5 mV
MDC IDC LEAD IMPLANT DT: 20140408
MDC IDC LEAD LOCATION: 753860
MDC IDC MSMT LEADCHNL RA PACING THRESHOLD PULSEWIDTH: 0.5 ms
MDC IDC MSMT LEADCHNL RV IMPEDANCE VALUE: 337.5 Ohm
MDC IDC MSMT LEADCHNL RV SENSING INTR AMPL: 11 mV
MDC IDC PG SERIAL: 7094248
MDC IDC SET LEADCHNL RA PACING AMPLITUDE: 2 V
MDC IDC STAT BRADY RV PERCENT PACED: 0.08 %

## 2017-08-23 NOTE — Progress Notes (Signed)
ICD check in clinic. Normal device function. Thresholds and sensing consistent with previous device measurements. Impedance trends stable over time. No evidence of any ventricular arrhythmias. AMS<30sec. Histogram distribution appropriate for patient and level of activity. No changes made this session. Device programmed at appropriate safety margins. Device programmed to optimize intrinsic conduction. Estimated longevity 3.7-4.6 years. ROV w/ Greggory Brandy 11/08/2017

## 2017-10-19 ENCOUNTER — Encounter: Payer: Self-pay | Admitting: Internal Medicine

## 2017-10-25 ENCOUNTER — Ambulatory Visit: Payer: Medicare Other | Admitting: Cardiology

## 2017-11-08 ENCOUNTER — Ambulatory Visit: Payer: Medicare Other | Admitting: Internal Medicine

## 2017-11-08 VITALS — BP 134/76 | HR 78 | Ht 71.0 in | Wt 134.0 lb

## 2017-11-08 DIAGNOSIS — I255 Ischemic cardiomyopathy: Secondary | ICD-10-CM | POA: Diagnosis not present

## 2017-11-08 DIAGNOSIS — I25810 Atherosclerosis of coronary artery bypass graft(s) without angina pectoris: Secondary | ICD-10-CM

## 2017-11-08 DIAGNOSIS — I469 Cardiac arrest, cause unspecified: Secondary | ICD-10-CM

## 2017-11-08 DIAGNOSIS — Z95810 Presence of automatic (implantable) cardiac defibrillator: Secondary | ICD-10-CM | POA: Diagnosis not present

## 2017-11-08 LAB — CUP PACEART INCLINIC DEVICE CHECK
Brady Statistic RV Percent Paced: 0.12 %
Date Time Interrogation Session: 20190603132216
HighPow Impedance: 58.5 Ohm
Implantable Lead Implant Date: 20140408
Implantable Lead Location: 753860
Lead Channel Impedance Value: 350 Ohm
Lead Channel Impedance Value: 412.5 Ohm
Lead Channel Pacing Threshold Amplitude: 0.5 V
Lead Channel Pacing Threshold Amplitude: 0.5 V
Lead Channel Pacing Threshold Pulse Width: 0.5 ms
Lead Channel Pacing Threshold Pulse Width: 0.5 ms
Lead Channel Sensing Intrinsic Amplitude: 11.7 mV
Lead Channel Setting Pacing Amplitude: 2 V
Lead Channel Setting Pacing Amplitude: 2.5 V
Lead Channel Setting Pacing Pulse Width: 0.5 ms
Lead Channel Setting Sensing Sensitivity: 0.5 mV
MDC IDC LEAD IMPLANT DT: 20140408
MDC IDC LEAD LOCATION: 753859
MDC IDC MSMT BATTERY REMAINING LONGEVITY: 51 mo
MDC IDC MSMT LEADCHNL RA PACING THRESHOLD AMPLITUDE: 0.75 V
MDC IDC MSMT LEADCHNL RA PACING THRESHOLD AMPLITUDE: 0.75 V
MDC IDC MSMT LEADCHNL RA PACING THRESHOLD PULSEWIDTH: 0.5 ms
MDC IDC MSMT LEADCHNL RA SENSING INTR AMPL: 3.6 mV
MDC IDC MSMT LEADCHNL RV PACING THRESHOLD PULSEWIDTH: 0.5 ms
MDC IDC PG IMPLANT DT: 20140408
MDC IDC STAT BRADY RA PERCENT PACED: 23 %
Pulse Gen Serial Number: 7094248

## 2017-11-08 NOTE — Progress Notes (Signed)
PCP: [name], MD Primary Cardiologist: Dr Stanford Breed Primary EP: Dr Tama High is a 84 y.o. male who presents today for routine electrophysiology followup.  Since last being seen in our clinic, the patient reports doing very well. + fatigue (chronic complaint).  Today, he denies symptoms of palpitations, chest pain, shortness of breath,  lower extremity edema, dizziness, presyncope, syncope, or ICD shocks.  The patient is otherwise without complaint today.   Past Medical History:  Diagnosis Date  . Anemia   . CAD (coronary artery disease)    a. s/p remote CABG, b. AL STEMI c/b VF arrest in 05/2012 => LHC 05/10/12: Severe 3v CAD, S-OM1/2 ok, SVG-Dx ok, SVG-RCA occluded, LIMA-LAD atretic, proximal LAD occluded. EF 20% with anterior apical HK=> salvage PCI with POBA and thrombectomy of the occluded LAD;  c. 01/2017: atretic LIMA_LAD and known occluded SVG-RCA. Patent LAD stent and 70% proxRCA stenosis (FFR 0.88).   . Cataract   . Chronic systolic heart failure ([place])    a. Echo 12/13: EF 20-25%, anteroseptal, inferior, apical HK, mildly reduced RV function, trivial pericardial effusion;   b.  Echo 3/14:  Ant, septal, apical AK, EF 25%  . Clotting disorder ([place])   . Depression   . HTN (hypertension)   . Inguinal hernia    "have one now on my left side" (09/13/2012)  . Ischemic cardiomyopathy   . Kidney stones   . Osteoporosis   . Paroxysmal atrial fibrillation ([place]) 03/17/2016   noted on device interrogation,  will follow burden  . Pectus excavatum   . Pulmonary embolism ([place])    a. after adhesiolysis for SBO in 04/2012 => coumadin for 6 mos  . SBO (small bowel obstruction) [facility]) November 2013   s/p lysis of adhesions  . Sinoatrial node dysfunction ([facility])   . Ventricular fibrillation ([place]) 05/2012   . in setting of AL STEMI 12/13 => EF 20-25% => d/c on LifeVest (repeat echo planned in 08/2012)   Past Surgical History:  Procedure Laterality Date  . COLONOSCOPY      . CORONARY ARTERY BYPASS GRAFT  1996   "CABG X5" (09/13/2012)  . CYSTOSCOPY/RETROGRADE/URETEROSCOPY/STONE EXTRACTION WITH BASKET  1990's  . ESOPHAGOGASTRODUODENOSCOPY  05/08/2012   Procedure: ESOPHAGOGASTRODUODENOSCOPY (EGD);  Surgeon: Juanita Craver, MD;  Location: [facility] ENDOSCOPY;  Service: Endoscopy;  Laterality: N/A;  Nevin Bloodgood put on for Dr. Collene Mares , Dr. Collene Mares will call if she wants another time  . IMPLANTABLE CARDIOVERTER DEFIBRILLATOR IMPLANT N/A 09/13/2012   SJM Ellipse ER implanted by Dr Rayann Heman for secondary prevention  . INGUINAL HERNIA REPAIR Right 1996  . INTRAVASCULAR PRESSURE WIRE/FFR STUDY N/A 01/20/2017   Procedure: INTRAVASCULAR PRESSURE WIRE/FFR STUDY;  Surgeon: Wellington Hampshire, MD;  Location: [place] CV LAB;  Service: Cardiovascular;  Laterality: N/A;  . LAPAROTOMY  04/27/2012   Procedure: EXPLORATORY LAPAROTOMY;  Surgeon: Gwenyth Ober, MD;  Location: [place];  Service: General;  Laterality: N/A;  . LEFT HEART CATH AND CORS/GRAFTS ANGIOGRAPHY N/A 01/20/2017   Procedure: LEFT HEART CATH AND CORS/GRAFTS ANGIOGRAPHY;  Surgeon: Wellington Hampshire, MD;  Location: [place] CV LAB;  Service: Cardiovascular;  Laterality: N/A;  . LEFT HEART CATHETERIZATION WITH CORONARY ANGIOGRAM N/A 05/10/2012   Procedure: LEFT HEART CATHETERIZATION WITH CORONARY ANGIOGRAM;  Surgeon: Burnell Blanks, MD;  Location: [facility] CATH LAB;  Service: Cardiovascular;  Laterality: N/A;  . PTCA     percutaneous transluminal coronary intervention and brachy therapy, Bruce R. Olevia Perches, MD. EF 60%  .  TRANSURETHRAL RESECTION OF PROSTATE      ROS- all systems are reviewed and negative except as per HPI above  Current Outpatient Medications  Medication Sig Dispense Refill  . aspirin EC 81 MG tablet Take 81 mg by mouth daily.    Marland Kitchen atorvastatin (LIPITOR) 20 MG tablet TAKE 1 TABLET DAILY AT 6 PM 90 tablet 2  . carvedilol (COREG) 3.125 MG tablet Take 1 tablet (3.125 mg total) 2 (two) times daily by mouth. 180 tablet 3  .  cholecalciferol (VITAMIN D) 1000 UNITS tablet Take 1,000 Units by mouth daily.    Marland Kitchen docusate sodium (COLACE) 100 MG capsule Take 100 mg by mouth 2 (two) times daily as needed for constipation.     . furosemide (LASIX) 20 MG tablet TAKE 1 TABLET BY MOUTH AS  NEEDED FOR SWELLING 90 tablet 2  . Multiple Vitamins-Minerals (ICAPS AREDS 2 PO) Take 1 tablet by mouth daily.    . nitroGLYCERIN (NITROSTAT) 0.4 MG SL tablet Place 1 tablet (0.4 mg total) under the tongue every 5 (five) minutes x 3 doses as needed for chest pain. Take only as needed for chest pain if your blood pressure is greater than 100/80. 25 tablet 12   No current facility-administered medications for this visit.     Physical Exam: Vitals:   11/08/17 0951  BP: 134/76  Pulse: 78  Weight: 134 lb (60.8 kg)  Height: 5\' 11"  (1.803 m)    GEN- The patient is well appearing, alert and oriented x 3 today.   Head- normocephalic, atraumatic Eyes-  Sclera clear, conjunctiva pink Ears- hearing intact Oropharynx- clear Lungs- Clear to ausculation bilaterally, normal work of breathing Chest- ICD pocket is well healed Heart- Regular rate and rhythm, no murmurs, rubs or gallops, PMI not laterally displaced GI- soft, NT, ND, + BS Extremities- no clubbing, cyanosis, or edema  ICD interrogation- reviewed in detail today,  See PACEART report  ekg tracing ordered today is personally reviewed and shows sinus rhythm 78 bpm, PR 176 msec, PVCs, LVH, nonspecific ST/T changes  Wt Readings from Last 3 Encounters:  11/08/17 134 lb (60.8 kg)  03/15/17 127 lb 6.4 oz (57.8 kg)  02/01/17 131 lb (59.4 kg)    Assessment and Plan:  1.  Chronic systolic dysfunction/ ischemic CM/ CAD/ prior VF arrest euvolemic today No ischemic symptoms Stable on an appropriate medical regimen Normal ICD function See P[facility] Art report No changes today Declines remote monitoring due to poor cell reception  2. HTN Stable No change required today   Return for  device check in device clinic every 3 months Follow-up with EP NP every year Follow-up with Dr Stanford Breed as scheduled  Thompson Grayer MD, [facility] 11/08/2017 10:10 AM

## 2017-11-08 NOTE — Patient Instructions (Addendum)
Medication Instructions:  Your physician recommends that you continue on your current medications as directed. Please refer to the Current Medication list given to you today.  Labwork: None ordered.  Testing/Procedures: None ordered.  Follow-Up:  Your physician wants you to follow-up in: 3 months with the device clinic for a device check.  Your physician wants you to follow-up in: one year with Chanetta Marshall, NP.   You will receive a reminder letter in the mail two months in advance. If you don't receive a letter, please call our office to schedule the follow-up appointment.  Any Other Special Instructions Will Be Listed Below (If Applicable).  If you need a refill on your cardiac medications before your next appointment, please call your pharmacy.

## 2017-11-24 NOTE — Progress Notes (Signed)
HPI: FU coronary artery disease and ischemic cardiomyopathy. Patient is status post coronary artery bypass graft in 1996. Patient had an anterior infarct in December of 4098 complicated by ventricular fibrillation arrest. The patient had PCI of his LAD. Course complicated by hypoxic encephalopathy. He is status post ICD 4/14. Last echocardiogram August 2018 showed ejection fraction 20 to 11%, grade 1 diastolic dysfunction and mildly reduced RV function.  Cardiac catheterization August 2018 showed severe three-vessel coronary disease with patent saphenous vein graft to the diagonal, patent sequential saphenous vein graft to second and third marginals, patent LAD stent, atretic LIMA to the LAD and occluded saphenous vein graft to the right coronary artery.  There was a 70% proximal right coronary artery stenosis and FFR was 0.88.  Left ventricular end-diastolic pressure was normal.  Cardiac medications were discontinued because of hypotension.  Since last seen,  patient denies dyspnea, chest pain, palpitations or syncope.  He does have fatigue and weakness.  Current Outpatient Medications  Medication Sig Dispense Refill  . aspirin EC 81 MG tablet Take 81 mg by mouth daily.    Marland Kitchen atorvastatin (LIPITOR) 20 MG tablet TAKE 1 TABLET DAILY AT 6 PM 90 tablet 2  . carvedilol (COREG) 3.125 MG tablet Take 1 tablet (3.125 mg total) 2 (two) times daily by mouth. 180 tablet 3  . cholecalciferol (VITAMIN D) 1000 UNITS tablet Take 1,000 Units by mouth daily.    Marland Kitchen docusate sodium (COLACE) 100 MG capsule Take 100 mg by mouth 2 (two) times daily as needed for constipation.     . furosemide (LASIX) 20 MG tablet TAKE 1 TABLET BY MOUTH AS  NEEDED FOR SWELLING 90 tablet 2  . Multiple Vitamins-Minerals (ICAPS AREDS 2 PO) Take 1 tablet by mouth daily.    . nitroGLYCERIN (NITROSTAT) 0.4 MG SL tablet Place 1 tablet (0.4 mg total) under the tongue every 5 (five) minutes x 3 doses as needed for chest pain. Take only as needed  for chest pain if your blood pressure is greater than 100/80. 25 tablet 12   No current facility-administered medications for this visit.      Past Medical History:  Diagnosis Date  . Anemia   . CAD (coronary artery disease)    a. s/p remote CABG, b. AL STEMI c/b VF arrest in 05/2012 => LHC 05/10/12: Severe 3v CAD, S-OM1/2 ok, SVG-Dx ok, SVG-RCA occluded, LIMA-LAD atretic, proximal LAD occluded. EF 20% with anterior apical HK=> salvage PCI with POBA and thrombectomy of the occluded LAD;  c. 01/2017: atretic LIMA_LAD and known occluded SVG-RCA. Patent LAD stent and 70% proxRCA stenosis (FFR 0.88).   . Cataract   . Chronic systolic heart failure ([place])    a. Echo 12/13: EF 20-25%, anteroseptal, inferior, apical HK, mildly reduced RV function, trivial pericardial effusion;   b.  Echo 3/14:  Ant, septal, apical AK, EF 25%  . Clotting disorder ([place])   . Depression   . HTN (hypertension)   . Inguinal hernia    "have one now on my left side" (09/13/2012)  . Ischemic cardiomyopathy   . Kidney stones   . Osteoporosis   . Paroxysmal atrial fibrillation ([place]) 03/17/2016   noted on device interrogation,  will follow burden  . Pectus excavatum   . Pulmonary embolism ([place])    a. after adhesiolysis for SBO in 04/2012 => coumadin for 6 mos  . SBO (small bowel obstruction) [facility]) November 2013   s/p lysis of adhesions  . Sinoatrial node  dysfunction ([place])   . Ventricular fibrillation ([place]) 05/2012   . in setting of AL STEMI 12/13 => EF 20-25% => d/c on LifeVest (repeat echo planned in 08/2012)    Past Surgical History:  Procedure Laterality Date  . COLONOSCOPY    . CORONARY ARTERY BYPASS GRAFT  1996   "CABG X5" (09/13/2012)  . CYSTOSCOPY/RETROGRADE/URETEROSCOPY/STONE EXTRACTION WITH BASKET  1990's  . ESOPHAGOGASTRODUODENOSCOPY  05/08/2012   Procedure: ESOPHAGOGASTRODUODENOSCOPY (EGD);  Surgeon: Juanita Craver, MD;  Location: [facility] ENDOSCOPY;  Service: Endoscopy;  Laterality: N/A;  Nevin Bloodgood put on for Dr. Collene Mares ,  Dr. Collene Mares will call if she wants another time  . IMPLANTABLE CARDIOVERTER DEFIBRILLATOR IMPLANT N/A 09/13/2012   SJM Ellipse ER implanted by Dr Rayann Heman for secondary prevention  . INGUINAL HERNIA REPAIR Right 1996  . INTRAVASCULAR PRESSURE WIRE/FFR STUDY N/A 01/20/2017   Procedure: INTRAVASCULAR PRESSURE WIRE/FFR STUDY;  Surgeon: Wellington Hampshire, MD;  Location: [place] CV LAB;  Service: Cardiovascular;  Laterality: N/A;  . LAPAROTOMY  04/27/2012   Procedure: EXPLORATORY LAPAROTOMY;  Surgeon: Gwenyth Ober, MD;  Location: [place];  Service: General;  Laterality: N/A;  . LEFT HEART CATH AND CORS/GRAFTS ANGIOGRAPHY N/A 01/20/2017   Procedure: LEFT HEART CATH AND CORS/GRAFTS ANGIOGRAPHY;  Surgeon: Wellington Hampshire, MD;  Location: [place] CV LAB;  Service: Cardiovascular;  Laterality: N/A;  . LEFT HEART CATHETERIZATION WITH CORONARY ANGIOGRAM N/A 05/10/2012   Procedure: LEFT HEART CATHETERIZATION WITH CORONARY ANGIOGRAM;  Surgeon: Burnell Blanks, MD;  Location: [facility] CATH LAB;  Service: Cardiovascular;  Laterality: N/A;  . PTCA     percutaneous transluminal coronary intervention and brachy therapy, Bruce R. Olevia Perches, MD. EF 60%  . TRANSURETHRAL RESECTION OF PROSTATE      Social History   Socioeconomic History  . Marital status: Widowed    Spouse name: Not on file  . Number of children: 1  . Years of education: Not on file  . Highest education level: Not on file  Occupational History  . Occupation: [name]    Employer: RETIRED  Social Needs  . Financial resource strain: Not on file  . Food insecurity:    Worry: Not on file    Inability: Not on file  . Transportation needs:    Medical: Not on file    Non-medical: Not on file  Tobacco Use  . Smoking status: Former Smoker    Types: Cigarettes  . Smokeless tobacco: Never Used  . Tobacco comment: 09/14/2011 "quit smoking when I was ~ 17; probably didn't smoke 10 packs in my life"  Substance and Sexual Activity  .  Alcohol use: No  . Drug use: No  . Sexual activity: Not Currently  Lifestyle  . Physical activity:    Days per week: Not on file    Minutes per session: Not on file  . Stress: Not on file  Relationships  . Social connections:    Talks on phone: Not on file    Gets together: Not on file    Attends religious service: Not on file    Active member of club or organization: Not on file    Attends meetings of clubs or organizations: Not on file    Relationship status: Not on file  . Intimate partner violence:    Fear of current or ex partner: Not on file    Emotionally abused: Not on file    Physically abused: Not on file    Forced sexual activity: Not on file  Other Topics Concern  . Not on file  Social History Narrative  . Not on file    Family History  Problem Relation Age of Onset  . Heart disease Father   . Other Mother        brain tumor  . Colon cancer Brother   . Cancer Brother        Liver  . Heart disease Brother     ROS: Fatigue but no fevers or chills, productive cough, hemoptysis, dysphasia, odynophagia, melena, hematochezia, dysuria, hematuria, rash, seizure activity, orthopnea, PND, pedal edema, claudication. Remaining systems are negative.  Physical Exam: Well-developed thin in no acute distress.  Skin is warm and dry.  HEENT is normal.  Neck is supple.  Chest is clear to auscultation with normal expansion.  Pectus excavatum Cardiovascular exam is regular rate and rhythm.  Abdominal exam nontender or distended. No masses palpated. Extremities show no edema. neuro grossly intact  Sinus rhythm with occasional atrial paced beats, PACs, anterior lateral T wave inversion.  A/P  1 coronary artery disease status post coronary artery bypass and graft-patient doing well with no chest pain.  Continue medical therapy with aspirin and statin.  2 ischemic cardiomyopathy-continue carvedilol.  Patient has had problems with borderline hypotension in the past.  I  would like to see if he will tolerate lisinopril again.  I will add 2.5 mg daily and follow his blood pressure.  Check potassium and renal function in 1 week.  3 hypertension-history of hypertension but now blood pressure runs low.  Plan as outlined above.  4 hyperlipidemia-continue statin.  5 prior ICD-management per electrophysiology.  6 chronic systolic congestive heart failure-patient is euvolemic.  Continue present dose of Lasix.  Check potassium and renal function.  Kirk Ruths, MD

## 2017-11-25 ENCOUNTER — Ambulatory Visit: Payer: Medicare Other | Admitting: Cardiology

## 2017-11-25 ENCOUNTER — Encounter: Payer: Self-pay | Admitting: Cardiology

## 2017-11-25 VITALS — BP 122/76 | HR 66 | Ht 71.0 in | Wt 134.0 lb

## 2017-11-25 DIAGNOSIS — E7800 Pure hypercholesterolemia, unspecified: Secondary | ICD-10-CM

## 2017-11-25 DIAGNOSIS — I5042 Chronic combined systolic (congestive) and diastolic (congestive) heart failure: Secondary | ICD-10-CM

## 2017-11-25 DIAGNOSIS — I10 Essential (primary) hypertension: Secondary | ICD-10-CM

## 2017-11-25 DIAGNOSIS — I2510 Atherosclerotic heart disease of native coronary artery without angina pectoris: Secondary | ICD-10-CM

## 2017-11-25 DIAGNOSIS — I255 Ischemic cardiomyopathy: Secondary | ICD-10-CM

## 2017-11-25 MED ORDER — LISINOPRIL 2.5 MG PO TABS: 2.5000 mg | ORAL_TABLET | Freq: Every day | ORAL | 3 refills | Status: DC

## 2017-11-25 NOTE — Patient Instructions (Signed)
Medication Instructions:   START LISINOPRIL 2.5 MG ONCE DAILY  Labwork:  Your physician recommends that you return for lab work in: [place]:  Your physician recommends that you schedule a follow-up appointment in: [place] recommends that you schedule a follow-up appointment in: [place]    If you need a refill on your cardiac medications before your next appointment, please call your pharmacy.

## 2017-12-20 ENCOUNTER — Telehealth: Payer: Self-pay | Admitting: *Deleted

## 2017-12-20 NOTE — Telephone Encounter (Signed)
Received a letter from the patient, he stopped his lisinopril due to his bp getting too low, an increase in fatigue and some lightheadedness. Dr Stanford Breed made aware.

## 2018-01-09 NOTE — Progress Notes (Signed)
Cardiology Office Note   Date:  01/10/2018   ID:  Louis Reid, DOB [birth year], MRN [id number]  PCP:  [name], MD  Cardiologist:  Dr. Stanford Breed Chief Complaint  Patient presents with  . Hypertension  . Cardiomyopathy  . Congestive Heart Failure     History of Present Illness: Louis Reid is a 84 y.o. male who presents for ongoing assessment and management of coronary artery disease with history of ischemic cardiomyopathy.  Patient has a history of CABG in 1996.  The patient also had an anterior infarct in December 1093 complicated by VT arrest.  Patient had PCI of his LAD, with hospital course complicated by hypoxic encephalopathy.  Other history includes ICD implantation on 4/14.  Most recent echocardiogram documented was in August 2018 with an EF of 20% to 25% and grade 1 diastolic dysfunction mildly reduced RV dysfunction.  Repeat cardiac catheterization in August 2018 revealed severe three-vessel CAD with patent SVG to diagonal, patent SVG to second and third marginals, patient LAD stent, atretic LIMA to LAD and occluded SVG to RCA.  There was a 70% proximal right coronary artery stenosis and FFR was 0.88.  At that time cardiac medications were discontinued due to hypotension.  He was last seen by Dr. Stanford Breed on 11/25/2017.  He was started on low-dose lisinopril 2.5 mg daily and was to have blood pressure followed with BMET in 1 week after starting.  Review of labs finds that his creatinine was 1.30 (prior labs 1.10).  Potassium 4.5 (prior 3.9).  He is here today with multiple questions concerning his medications, along with multiple concerns. He has written down a list of symptoms as well. He reports significant fatigue, some DOE He states that he stopped taking lisinopril because his BP was "too low" and he felt worse taking it.   Past Medical History:  Diagnosis Date  . Anemia   . CAD (coronary artery disease)    a. s/p remote CABG, b. AL STEMI c/b VF arrest in  05/2012 => LHC 05/10/12: Severe 3v CAD, S-OM1/2 ok, SVG-Dx ok, SVG-RCA occluded, LIMA-LAD atretic, proximal LAD occluded. EF 20% with anterior apical HK=> salvage PCI with POBA and thrombectomy of the occluded LAD;  c. 01/2017: atretic LIMA_LAD and known occluded SVG-RCA. Patent LAD stent and 70% proxRCA stenosis (FFR 0.88).   . Cataract   . Chronic systolic heart failure ([place])    a. Echo 12/13: EF 20-25%, anteroseptal, inferior, apical HK, mildly reduced RV function, trivial pericardial effusion;   b.  Echo 3/14:  Ant, septal, apical AK, EF 25%  . Clotting disorder ([place])   . Depression   . HTN (hypertension)   . Inguinal hernia    "have one now on my left side" (09/13/2012)  . Ischemic cardiomyopathy   . Kidney stones   . Osteoporosis   . Paroxysmal atrial fibrillation ([place]) 03/17/2016   noted on device interrogation,  will follow burden  . Pectus excavatum   . Pulmonary embolism ([place])    a. after adhesiolysis for SBO in 04/2012 => coumadin for 6 mos  . SBO (small bowel obstruction) [facility]) November 2013   s/p lysis of adhesions  . Sinoatrial node dysfunction ([facility])   . Ventricular fibrillation ([place]) 05/2012   . in setting of AL STEMI 12/13 => EF 20-25% => d/c on LifeVest (repeat echo planned in 08/2012)    Past Surgical History:  Procedure Laterality Date  . COLONOSCOPY    . CORONARY ARTERY BYPASS GRAFT  1996   "  CABG X5" (09/13/2012)  . CYSTOSCOPY/RETROGRADE/URETEROSCOPY/STONE EXTRACTION WITH BASKET  1990's  . ESOPHAGOGASTRODUODENOSCOPY  05/08/2012   Procedure: ESOPHAGOGASTRODUODENOSCOPY (EGD);  Surgeon: Juanita Craver, MD;  Location: [facility] ENDOSCOPY;  Service: Endoscopy;  Laterality: N/A;  Nevin Bloodgood put on for Dr. Collene Mares , Dr. Collene Mares will call if she wants another time  . IMPLANTABLE CARDIOVERTER DEFIBRILLATOR IMPLANT N/A 09/13/2012   SJM Ellipse ER implanted by Dr Rayann Heman for secondary prevention  . INGUINAL HERNIA REPAIR Right 1996  . INTRAVASCULAR PRESSURE WIRE/FFR STUDY N/A 01/20/2017   Procedure:  INTRAVASCULAR PRESSURE WIRE/FFR STUDY;  Surgeon: Wellington Hampshire, MD;  Location: [place] CV LAB;  Service: Cardiovascular;  Laterality: N/A;  . LAPAROTOMY  04/27/2012   Procedure: EXPLORATORY LAPAROTOMY;  Surgeon: Gwenyth Ober, MD;  Location: [place];  Service: General;  Laterality: N/A;  . LEFT HEART CATH AND CORS/GRAFTS ANGIOGRAPHY N/A 01/20/2017   Procedure: LEFT HEART CATH AND CORS/GRAFTS ANGIOGRAPHY;  Surgeon: Wellington Hampshire, MD;  Location: [place] CV LAB;  Service: Cardiovascular;  Laterality: N/A;  . LEFT HEART CATHETERIZATION WITH CORONARY ANGIOGRAM N/A 05/10/2012   Procedure: LEFT HEART CATHETERIZATION WITH CORONARY ANGIOGRAM;  Surgeon: Burnell Blanks, MD;  Location: [facility] CATH LAB;  Service: Cardiovascular;  Laterality: N/A;  . PTCA     percutaneous transluminal coronary intervention and brachy therapy, Bruce R. Olevia Perches, MD. EF 60%  . TRANSURETHRAL RESECTION OF PROSTATE       Current Outpatient Medications  Medication Sig Dispense Refill  . aspirin EC 81 MG tablet Take 81 mg by mouth daily.    Marland Kitchen atorvastatin (LIPITOR) 20 MG tablet TAKE 1 TABLET DAILY AT 6 PM 90 tablet 2  . carvedilol (COREG) 3.125 MG tablet Take 1 tablet (3.125 mg total) 2 (two) times daily by mouth. 180 tablet 3  . cholecalciferol (VITAMIN D) 1000 UNITS tablet Take 1,000 Units by mouth daily.    . cyanocobalamin 1000 MCG tablet Take 1,000 mcg by mouth daily.    Marland Kitchen docusate sodium (COLACE) 100 MG capsule Take 100 mg by mouth 2 (two) times daily as needed for constipation.     . furosemide (LASIX) 20 MG tablet Take 20 mg by mouth daily.    . Multiple Vitamins-Minerals (ICAPS AREDS 2 PO) Take 1 tablet by mouth daily.    . nitroGLYCERIN (NITROSTAT) 0.4 MG SL tablet Place 1 tablet (0.4 mg total) under the tongue every 5 (five) minutes x 3 doses as needed for chest pain. Take only as needed for chest pain if your blood pressure is greater than 100/80. 25 tablet 12   No current facility-administered  medications for this visit.     Allergies:   Cold medicine plus [[id number]]    Social History:  The patient  reports that he has quit smoking. His smoking use included cigarettes. He has never used smokeless tobacco. He reports that he does not drink alcohol or use drugs.   Family History:  The patient's family history includes Cancer in his brother; Colon cancer in his brother; Heart disease in his brother and father; Other in his mother.    ROS: All other systems are reviewed and negative. Unless otherwise mentioned in H&P    PHYSICAL EXAM: VS:  BP 123/76   Pulse 77   Ht 5\' 11"  (1.803 m)   Wt 130 lb 9.6 oz (59.2 kg)   BMI 18.22 kg/m  , BMI Body mass index is 18.22 kg/m. GEN: Well nourished, well developed, in no acute distress  HEENT: normal  Neck:  no JVD, carotid bruits, or masses Cardiac: RRR; distant heart sounds,  no murmurs, rubs, or gallops,no edema  Respiratory:  clear to auscultation bilaterally, normal work of breathing GI: soft, nontender, nondistended, + BS MS: no deformity or atrophy  Skin: warm and dry, no rash Neuro:  Strength and sensation are intact Psych: euthymic mood, full affect   EKG:  Not taken during this office visit.  Recent Labs: 01/20/2017: ALT 22; Hemoglobin 10.4; Platelets 112; TSH 3.123 03/15/2017: BUN 26; Creatinine, Ser 1.30; Potassium 4.5; Sodium 143    Lipid Panel    Component Value Date/Time   CHOL 89 (L) 08/24/2016 0931   TRIG 65 08/24/2016 0931   HDL 37 (L) 08/24/2016 0931   CHOLHDL 2.4 08/24/2016 0931   CHOLHDL 2.4 07/29/2015 0837   VLDL 15 07/29/2015 0837   LDLCALC 39 08/24/2016 0931      Wt Readings from Last 3 Encounters:  01/10/18 130 lb 9.6 oz (59.2 kg)  11/25/17 134 lb (60.8 kg)  11/08/17 134 lb (60.8 kg)      Other studies Reviewed: Cardiac Cath 01/20/2017  Ost Cx to Prox Cx lesion, 80 %stenosed.  Prox Cx to Mid Cx lesion, 100 %stenosed.  Prox LAD to Dist LAD lesion, 20 %stenosed.  Dist  LAD lesion, 30 %stenosed.  Prox RCA lesion, 70 %stenosed.  Prox RCA to Mid RCA lesion, 30 %stenosed.  Ost 2nd Diag to 2nd Diag lesion, 100 %stenosed.  SVG.  The graft exhibits mild .  SVG.  Origin to Prox Graft lesion, 30 %stenosed.  Mid Graft lesion, 20 %stenosed.  SVG graft was not injected.  Origin lesion, 100 %stenosed.   1.significant underlying three-vessel coronary artery disease with patent SVG to diagonal and patent sequential SVG to OM 2 and OM 3. Patent proximal LAD stent. LIMA to LAD is known to be atretic and SVG to RCA is known to be occluded. His were not injected. 70% proximal RCA stenosis which is stable from most recent angiography in 2013. This was evaluated by pressure wire and was found to be not significant with an FFR ratio of 0.88.  Echocardiogram 01/20/2017 Left ventricle: The cavity size was mildly dilated. Wall   thickness was normal. Systolic function was severely reduced. The   estimated ejection fraction was in the range of 20% to 25%.   Akinesis and aneurysmal deformity of the anteroseptal, anterior,   and apical myocardium; consistent with infarction in the   distribution of the left anterior descending coronary artery.   Doppler parameters are consistent with abnormal left ventricular   relaxation (grade 1 diastolic dysfunction). Acoustic contrast   opacification revealed no evidence ofthrombus. - Right ventricle: Systolic function was mildly reduced.   ASSESSMENT AND PLAN:  1.  Ischemic CM with HRrEF: He has multiple questions and complaints. I have reviewed his heart function and explained that it is very weak. I have explained why he needs to take his medications and how they are to benefit him. He verbalizes understanding. He is willing to continue these medications and put up with the side effects of fatigue for now.  I will not restart the lisinopril as he did not tolerate this. I will repeat his echo for comparison Continue lasix. No  Entresto in the setting of CKD.   2. CAD: CABG, cath as above with patent SVG to diagonal, patent SVG to OM 2, and OM 3. Patent proximal LAD stent. LIMA to LAD and SVG to RCA known occluded. Stable proximal RCA. He will  continue coreg and ASA.   3.  Hypercholesterolemia: Continue statin therapy.    Current medicines are reviewed at length with the patient today.  I have spend > 40 minutes with this patient answering questions and explaining his current heart status.   Labs/ tests ordered today include: Echocardiogram   Phill Myron. West Pugh, ANP, AACC   01/10/2018 12:06 PM    [facility] [address], [place], [place] [address] Phone: [phone]; Fax: [phone]

## 2018-01-10 ENCOUNTER — Ambulatory Visit: Payer: Medicare Other | Admitting: Adult Health

## 2018-01-10 ENCOUNTER — Encounter: Payer: Self-pay | Admitting: Adult Health

## 2018-01-10 VITALS — BP 123/76 | HR 77 | Ht 71.0 in | Wt 130.6 lb

## 2018-01-10 DIAGNOSIS — I5021 Acute systolic (congestive) heart failure: Secondary | ICD-10-CM

## 2018-01-10 DIAGNOSIS — R931 Abnormal findings on diagnostic imaging of heart and coronary circulation: Secondary | ICD-10-CM

## 2018-01-10 DIAGNOSIS — E7800 Pure hypercholesterolemia, unspecified: Secondary | ICD-10-CM

## 2018-01-10 DIAGNOSIS — I2510 Atherosclerotic heart disease of native coronary artery without angina pectoris: Secondary | ICD-10-CM

## 2018-01-10 NOTE — Patient Instructions (Signed)
Medication Instructions:  NO CHANGES- Your physician recommends that you continue on your current medications as directed. Please refer to the Current Medication list given to you today.  If you need a refill on your cardiac medications before your next appointment, please call your pharmacy.  Testing/Procedures: Echocardiogram - Your physician has requested that you have an echocardiogram. Echocardiography is a painless test that uses sound waves to create images of your heart. It provides your doctor with information about the size and shape of your heart and how well your heart's chambers and valves are working. This procedure takes approximately one hour. There are no restrictions for this procedure. This will be performed at our [facility] location - [address].  Follow-Up: Your physician wants you to follow-up in: [place].   Thank you for choosing [facility] at [facility]!!

## 2018-01-17 ENCOUNTER — Ambulatory Visit (HOSPITAL_COMMUNITY): Payer: Medicare Other | Attending: Cardiology

## 2018-01-17 ENCOUNTER — Other Ambulatory Visit: Payer: Self-pay

## 2018-01-17 ENCOUNTER — Telehealth: Payer: Self-pay | Admitting: Cardiology

## 2018-01-17 DIAGNOSIS — I110 Hypertensive heart disease with heart failure: Secondary | ICD-10-CM | POA: Insufficient documentation

## 2018-01-17 DIAGNOSIS — R931 Abnormal findings on diagnostic imaging of heart and coronary circulation: Secondary | ICD-10-CM

## 2018-01-17 DIAGNOSIS — I5021 Acute systolic (congestive) heart failure: Secondary | ICD-10-CM

## 2018-01-17 DIAGNOSIS — I255 Ischemic cardiomyopathy: Secondary | ICD-10-CM | POA: Insufficient documentation

## 2018-01-17 DIAGNOSIS — Z951 Presence of aortocoronary bypass graft: Secondary | ICD-10-CM | POA: Diagnosis not present

## 2018-01-17 NOTE — Telephone Encounter (Signed)
New Message:      Pt is returning a call for his echo results

## 2018-01-18 ENCOUNTER — Other Ambulatory Visit: Payer: Self-pay

## 2018-01-18 MED ORDER — LISINOPRIL 2.5 MG PO TABS: 2.5000 mg | ORAL_TABLET | Freq: Every day | ORAL | 3 refills | Status: DC

## 2018-01-18 NOTE — Progress Notes (Signed)
Notes recorded by Lendon Colonel, NP on 01/17/2018 at 3:53 PM EDT Significantly reduced EF to 15% from prior EF of 25%. He will need to be seen by Advanced Heart Failure clinic for assistance with management. Recommend that he start on lisinopril 2.5 mg daily, this will drop his BP some but he will need this to help his heart function. Please have him seen as soon as next available in CHF clinic.  Patient informed directly-verbalized understanding no further questions, pt will await scheduling call

## 2018-01-20 ENCOUNTER — Telehealth: Payer: Self-pay | Admitting: Cardiology

## 2018-01-20 NOTE — Telephone Encounter (Signed)
Per Jory Sims NP's results of echo, patient needs to be seen in CHF clinic next available. Will send message to CHF triage.  Notes recorded by Lendon Colonel, NP on 01/17/2018 at 3:53 PM EDT Significantly reduced EF to 15% from prior EF of 25%. He will need to be seen by Advanced Heart Failure clinic for assistance with management. Recommend that he start on lisinopril 2.5 mg daily, this will drop his BP some but he will need this to help his heart function. Please have him seen as soon as next available in CHF clinic.

## 2018-01-20 NOTE — Telephone Encounter (Signed)
New Message:      Pt is calling to see about his referral to the CHF Clinic. He states he has not received a call from anyone as of yet.

## 2018-01-24 NOTE — Telephone Encounter (Signed)
Spoke with pt. Adv pt that I placed a call to the [facility]. Heather, RN handles the ref and was out of the office last week. Nira Conn is in the office today. Provided the pt's name and dob. They will call the pt shortly to schedule his new pt appt.  Pt voiced appreciation for the assistance. Pt request an update be fwd to Dr.Crenshaw's nurse Hilda Blades, RN

## 2018-01-24 NOTE — Telephone Encounter (Signed)
Follow up   Patient calling to request a call from nurse, patient wants to follow up on ref to heart failure clinic

## 2018-01-25 NOTE — Telephone Encounter (Signed)
Pt is sch to see Dr Haroldine Laws 01/28/18

## 2018-01-28 ENCOUNTER — Ambulatory Visit (HOSPITAL_COMMUNITY)
Admission: RE | Admit: 2018-01-28 | Discharge: 2018-01-28 | Disposition: A | Discharge status: Home / Self Care | Payer: Medicare Other | Source: Ambulatory Visit | Attending: Internal Medicine | Admitting: Internal Medicine

## 2018-01-28 VITALS — BP 120/68 | HR 74 | Wt 129.5 lb

## 2018-01-28 DIAGNOSIS — Z8489 Family history of other specified conditions: Secondary | ICD-10-CM | POA: Insufficient documentation

## 2018-01-28 DIAGNOSIS — I480 Paroxysmal atrial fibrillation: Secondary | ICD-10-CM | POA: Insufficient documentation

## 2018-01-28 DIAGNOSIS — E785 Hyperlipidemia, unspecified: Secondary | ICD-10-CM | POA: Insufficient documentation

## 2018-01-28 DIAGNOSIS — I110 Hypertensive heart disease with heart failure: Secondary | ICD-10-CM | POA: Diagnosis present

## 2018-01-28 DIAGNOSIS — Z888 Allergy status to other drugs, medicaments and biological substances status: Secondary | ICD-10-CM | POA: Insufficient documentation

## 2018-01-28 DIAGNOSIS — Z95810 Presence of automatic (implantable) cardiac defibrillator: Secondary | ICD-10-CM | POA: Diagnosis not present

## 2018-01-28 DIAGNOSIS — Z8249 Family history of ischemic heart disease and other diseases of the circulatory system: Secondary | ICD-10-CM | POA: Insufficient documentation

## 2018-01-28 DIAGNOSIS — I25810 Atherosclerosis of coronary artery bypass graft(s) without angina pectoris: Secondary | ICD-10-CM

## 2018-01-28 DIAGNOSIS — Z800 Family history of malignant neoplasm of digestive organs: Secondary | ICD-10-CM | POA: Diagnosis not present

## 2018-01-28 DIAGNOSIS — Z87891 Personal history of nicotine dependence: Secondary | ICD-10-CM | POA: Diagnosis not present

## 2018-01-28 DIAGNOSIS — Q676 Pectus excavatum: Secondary | ICD-10-CM | POA: Insufficient documentation

## 2018-01-28 DIAGNOSIS — Z7982 Long term (current) use of aspirin: Secondary | ICD-10-CM | POA: Diagnosis not present

## 2018-01-28 DIAGNOSIS — R9431 Abnormal electrocardiogram [ECG] [EKG]: Secondary | ICD-10-CM | POA: Insufficient documentation

## 2018-01-28 DIAGNOSIS — Z79899 Other long term (current) drug therapy: Secondary | ICD-10-CM | POA: Diagnosis not present

## 2018-01-28 DIAGNOSIS — I5042 Chronic combined systolic (congestive) and diastolic (congestive) heart failure: Secondary | ICD-10-CM | POA: Diagnosis not present

## 2018-01-28 LAB — BASIC METABOLIC PANEL
Anion gap: 8 (ref 5–15)
BUN: 27 mg/dL — ABNORMAL HIGH (ref 8–23)
CHLORIDE: 109 mmol/L (ref 98–111)
CO2: 26 mmol/L (ref 22–32)
Calcium: 9.2 mg/dL (ref 8.9–10.3)
Creatinine, Ser: 1.36 mg/dL — ABNORMAL HIGH (ref 0.61–1.24)
GFR calc non Af Amer: 46 mL/min — ABNORMAL LOW (ref >60)
GFR, EST AFRICAN AMERICAN: 54 mL/min — AB (ref >60)
Glucose, Bld: 91 mg/dL (ref 70–99)
POTASSIUM: 4.4 mmol/L (ref 3.5–5.1)
SODIUM: 143 mmol/L (ref 135–145)

## 2018-01-28 LAB — CBC
HEMATOCRIT: 45 % (ref 39.0–52.0)
HEMOGLOBIN: 13.5 g/dL (ref 13.0–17.0)
MCH: 31.5 pg (ref 26.0–34.0)
MCHC: 30 g/dL (ref 30.0–36.0)
MCV: 105.1 fL — ABNORMAL HIGH (ref 78.0–100.0)
Platelets: 121 10*3/uL — ABNORMAL LOW (ref 150–400)
RBC: 4.28 MIL/uL (ref 4.22–5.81)
RDW: 14.6 % (ref 11.5–15.5)
WBC: 6.4 10*3/uL (ref 4.0–10.5)

## 2018-01-28 MED ORDER — DIGOXIN 125 MCG PO TABS: 0.1250 mg | ORAL_TABLET | Freq: Every day | ORAL | 3 refills | Status: DC

## 2018-01-28 NOTE — Patient Instructions (Signed)
Stop Carvedilol  Start Digoxin 0.125 mg daily  Labs done today  Your physician recommends that you schedule a follow-up appointment in: 4 weeks

## 2018-01-28 NOTE — Progress Notes (Signed)
Advanced Heart Failure Clinic Consult Note   Referring Physician: Jory Sims, NP PCP: [name], MD PCP-Cardiologist: Kirk Ruths, MD   Referring provider: Jory Sims, DNP  HPI:  Louis Reid is a 84 y.o. male with PMH of CAD s/p CABG in 1996 with STEMI in 05/2012 and cath showing patent SVG-OM1-OM2 and SVG-D1 with occluded SVG-RCA and atretic LIMA-LAD with thrombectomy of proximal LAD occlusion, no significant changes on cath in 01/2017), HTN, HLD, ICM (s/p ICD placement in 2014), and chronic combined systolic and diastolic CHF.   Seen in [facility] clinic 01/10/2018 with multiple complaints. This included fatigue and DOE. He had stopped lisinopril due to hypotension.  Repeat echo ordered as below which showed decrease in EF, so referred to HF clinic by Jory Sims for HF evaluation & med optimization.    He is overall feeling more tired.  He feels like over the past 6-7 months he has become increasingly more fatigued with little energy. He can do his ADLs and walk for a short distance on flat ground, but becomes tired very rapidly on an incline or if he gets in a hurry. He has to stop in the middle of making breakfast, just for himself. Lives at home alone. Wife passed several years ago, and son lives out of town. He takes all his medications daily, but feels like his medicines are part of what are causing his symptoms. Hasn't smoked in 60+ years. No ETOH. He does have lightheadedness with standing. He feels like he has good urine output on lasix. Doesn't take any extra. He does have occasional chest tightness along with his fatigue. He doesn't have any overt SOB. No orthopnea/PND. He does comment that his legs feel tired very quickly. He is not very active.He has not had any ICD firings. No syncope or presyncope.    EKG: NSR 74 bpm with "marked sinus arrhyhtmia" with 1st degree block.   Echo 01/17/18 LVEF 15%, Grade 1 DD, Trivial MR, Mildly reduced RV function, PA  peak pressure 32 mm Hg.   Echo 01/20/2017 LVEF 20-25%, Grade 1 DD, Mildly reduced RV function  LHC 01/20/2017  Ost Cx to Prox Cx lesion, 80 %stenosed.  Prox Cx to Mid Cx lesion, 100 %stenosed.  Prox LAD to Dist LAD lesion, 20 %stenosed.  Dist LAD lesion, 30 %stenosed.  Prox RCA lesion, 70 %stenosed.  Prox RCA to Mid RCA lesion, 30 %stenosed.  Ost 2nd Diag to 2nd Diag lesion, 100 %stenosed.  SVG.  The graft exhibits mild .  SVG.  Origin to Prox Graft lesion, 30 %stenosed.  Mid Graft lesion, 20 %stenosed.  SVG graft was not injected.  Origin lesion, 100 %stenosed.   Review of Systems: [y] = yes, [ ]  = no   General: Weight gain [ ] ; Weight loss [ ] ; Anorexia [ ] ; Fatigue [y]; Fever [ ] ; Chills [ ] ; Weakness [y]  Cardiac: Chest pain/pressure [ ] ; Resting SOB [ ] ; Exertional SOB [ y]; Orthopnea [ ] ; Pedal Edema [ ] ; Palpitations [ ] ; Syncope [ ] ; Presyncope [ ] ; Paroxysmal nocturnal dyspnea[ ]   Pulmonary: Cough [ ] ; Wheezing[ ] ; Hemoptysis[ ] ; Sputum [ ] ; Snoring [ ]   GI: Vomiting[ ] ; Dysphagia[ ] ; Melena[ ] ; Hematochezia [ ] ; Heartburn[ ] ; Abdominal pain [ ] ; Constipation [ ] ; Diarrhea [ ] ; BRBPR [ ]   GU: Hematuria[ ] ; Dysuria [ ] ; Nocturia[ ]   Vascular: Pain in legs with walking [ ] ; Pain in feet with lying flat [ ] ; Non-healing sores [ ] ;  Stroke [ ] ; TIA [ ] ; Slurred speech [ ] ;  Neuro: Headaches[ ] ; Vertigo[ ] ; Seizures[ ] ; Paresthesias[ ] ;Blurred vision [ ] ; Diplopia [ ] ; Vision changes [ ]   Ortho/Skin: Arthritis Blue.Reese ]; Joint pain [ y]; Muscle pain [ ] ; Joint swelling [ ] ; Back Pain [ ] ; Rash [ ]   Psych: Depression[ ] ; Anxiety[ ]   Heme: Bleeding problems [ ] ; Clotting disorders [ ] ; Anemia [ ]   Endocrine: Diabetes [ ] ; Thyroid dysfunction[ ]   Past Medical History:  Diagnosis Date  . Anemia   . CAD (coronary artery disease)    a. s/p remote CABG, b. AL STEMI c/b VF arrest in 05/2012 => LHC 05/10/12: Severe 3v CAD, S-OM1/2 ok, SVG-Dx ok, SVG-RCA occluded, LIMA-LAD  atretic, proximal LAD occluded. EF 20% with anterior apical HK=> salvage PCI with POBA and thrombectomy of the occluded LAD;  c. 01/2017: atretic LIMA_LAD and known occluded SVG-RCA. Patent LAD stent and 70% proxRCA stenosis (FFR 0.88).   . Cataract   . Chronic systolic heart failure ([place])    a. Echo 12/13: EF 20-25%, anteroseptal, inferior, apical HK, mildly reduced RV function, trivial pericardial effusion;   b.  Echo 3/14:  Ant, septal, apical AK, EF 25%  . Clotting disorder ([place])   . Depression   . HTN (hypertension)   . Inguinal hernia    "have one now on my left side" (09/13/2012)  . Ischemic cardiomyopathy   . Kidney stones   . Osteoporosis   . Paroxysmal atrial fibrillation ([place]) 03/17/2016   noted on device interrogation,  will follow burden  . Pectus excavatum   . Pulmonary embolism ([place])    a. after adhesiolysis for SBO in 04/2012 => coumadin for 6 mos  . SBO (small bowel obstruction) [facility]) November 2013   s/p lysis of adhesions  . Sinoatrial node dysfunction (HCC)   . Ventricular fibrillation ([place]) 05/2012   . in setting of AL STEMI 12/13 => EF 20-25% => d/c on LifeVest (repeat echo planned in 08/2012)    Current Outpatient Medications  Medication Sig Dispense Refill  . aspirin EC 81 MG tablet Take 81 mg by mouth daily.    Marland Kitchen atorvastatin (LIPITOR) 20 MG tablet TAKE 1 TABLET DAILY AT 6 PM 90 tablet 2  . carvedilol (COREG) 3.125 MG tablet Take 1 tablet (3.125 mg total) 2 (two) times daily by mouth. 180 tablet 3  . cholecalciferol (VITAMIN D) 1000 UNITS tablet Take 1,000 Units by mouth daily.    . cyanocobalamin 1000 MCG tablet Take 1,000 mcg by mouth daily.    Marland Kitchen docusate sodium (COLACE) 100 MG capsule Take 100 mg by mouth 2 (two) times daily as needed for constipation.     . furosemide (LASIX) 20 MG tablet Take 20 mg by mouth daily.    Marland Kitchen lisinopril (PRINIVIL,ZESTRIL) 2.5 MG tablet Take 1.25 mg by mouth daily.    . Multiple Vitamins-Minerals (ICAPS AREDS 2 PO) Take 1 tablet by  mouth daily.    . nitroGLYCERIN (NITROSTAT) 0.4 MG SL tablet Place 1 tablet (0.4 mg total) under the tongue every 5 (five) minutes x 3 doses as needed for chest pain. Take only as needed for chest pain if your blood pressure is greater than 100/80. 25 tablet 12   No current facility-administered medications for this encounter.     Allergies  Allergen Reactions  . Cold Medicine Plus [Chlorphen-Pseudoephed-Apap]     Reaction a long time ago - made him feel goofy      Social  History   Socioeconomic History  . Marital status: Widowed    Spouse name: Not on file  . Number of children: 1  . Years of education: Not on file  . Highest education level: Not on file  Occupational History  . Occupation: [name]    Employer: RETIRED  Social Needs  . Financial resource strain: Not on file  . Food insecurity:    Worry: Not on file    Inability: Not on file  . Transportation needs:    Medical: Not on file    Non-medical: Not on file  Tobacco Use  . Smoking status: Former Smoker    Types: Cigarettes  . Smokeless tobacco: Never Used  . Tobacco comment: 09/14/2011 "quit smoking when I was ~ 17; probably didn't smoke 10 packs in my life"  Substance and Sexual Activity  . Alcohol use: No  . Drug use: No  . Sexual activity: Not Currently  Lifestyle  . Physical activity:    Days per week: Not on file    Minutes per session: Not on file  . Stress: Not on file  Relationships  . Social connections:    Talks on phone: Not on file    Gets together: Not on file    Attends religious service: Not on file    Active member of club or organization: Not on file    Attends meetings of clubs or organizations: Not on file    Relationship status: Not on file  . Intimate partner violence:    Fear of current or ex partner: Not on file    Emotionally abused: Not on file    Physically abused: Not on file    Forced sexual activity: Not on file  Other Topics Concern  . Not on file  Social  History Narrative  . Not on file    Family History  Problem Relation Age of Onset  . Heart disease Father   . Other Mother        brain tumor  . Colon cancer Brother   . Cancer Brother        Liver  . Heart disease Brother     Vitals:   01/28/18 0949  BP: 120/68  Pulse: 74  SpO2: 96%  Weight: 58.7 kg (129 lb 8 oz)   Wt Readings from Last 3 Encounters:  01/28/18 58.7 kg (129 lb 8 oz)  01/10/18 59.2 kg (130 lb 9.6 oz)  11/25/17 60.8 kg (134 lb)    PHYSICAL EXAM: General:  Elderly and thin appearing. NAD HEENT: Normal Neck: Supple. no JVD. Carotids 2+ bilat; no bruits. No lymphadenopathy or thyromegaly appreciated. Cor: PMI laterally displaced. Irregular. No rubs, gallops or murmurs. +Marked pectus excavatum defect Lungs: CTAB, normal effort.  Abdomen: Soft, nontender, nondistended. No hepatosplenomegaly. No bruits or masses. Good bowel sounds. Extremities: No cyanosis, clubbing, or rash. No BLE edema.  Neuro: Alert & oriented x 3, cranial nerves grossly intact. moves all 4 extremities w/o difficulty. Affect pleasant.  EKG: NSR 74 bpm with "marked sinus arrhyhtmia" with 1st degree block. Personally reviewed   ASSESSMENT & PLAN:  1. Chronic systolic CHF, ICM - Echo 11/07/07 LVEF 15%, Grade 1 DD, Trivial MR, Mildly reduced RV function, PA peak pressure 32 mm Hg.  - s/p St. Jude ICD  - EF dropped from 20-25% -> 15% from 01/2017 - 2019. Though by visual review it does not appear to have decreased by much.  - NYHA IIIb symptoms. ? Component of  low output.  - Volume status looks OK.  - Continue lasix 20 mg daily for now. BMET today.  - Consider spiro at next visit. May need to decrease lasix to do so.  - Start digoxin 0.125 mg daily.  - STOP coreg 3.125 mg BID - Continue lisinopril 1.25 mg daily. He has been intolerant to higher doses.  2. CAD s/p CABG - Most recent cath 01/2017 with patent SVG to diagonal, patent SVG to OM 2, and OM 3. Patent proximal LAD stent. LIMA to LAD  and SVG to RCA known occluded.  - He has occasional chest tightness. May be work re-looking at his coronaries.  - Continue ASA and BB.  3. HLD - Continue statin per [facility].   Labs today. RTC 4 weeks. May be candidate for milrinone, but doesn't seem like he would want to do. Will re-visit at next appt depending on symptoms.   Shirley Friar, [name] 01/28/18   Patient seen and examined with the above-signed Advanced Practice Provider and/or Housestaff. I personally reviewed laboratory data, imaging studies and relevant notes. I independently examined the patient and formulated the important aspects of the plan. I have edited the note to reflect any of my changes or salient points. I have personally discussed the plan with the patient and/or family.  Delightful 84 y/o male with advanced CHF due to severe ICM. Referred by Jory Sims for further evaluation of his HF.   Has had progressive decline over past 6 months and now NYHA IIIB with poor tolerance of HF meds due to low BP. Volume status ok. Echo reviewed personally and EF 15-20% which hasn't really changed much from previous although he was frustrated to hear the numbers going down. On exam no evidence of volume overload. He has severe pectus excavatum defect that would preclude any type of mechanical support.   We had a long discussion about his situation and unfortunately I feel that he is nearing end-stage with low output. He is not candidate for advanced therapies. We will make some small changes by stopping carvedilol and adding digoxin. We discussed possibility of palliative home inotropes but he would like to defer for.now. We will see him back in several weeks.   Glori Bickers, MD  7:17 PM

## 2018-02-14 ENCOUNTER — Ambulatory Visit (INDEPENDENT_AMBULATORY_CARE_PROVIDER_SITE_OTHER): Payer: Medicare Other | Admitting: *Deleted

## 2018-02-14 DIAGNOSIS — I5042 Chronic combined systolic (congestive) and diastolic (congestive) heart failure: Secondary | ICD-10-CM | POA: Diagnosis not present

## 2018-02-14 DIAGNOSIS — I469 Cardiac arrest, cause unspecified: Secondary | ICD-10-CM

## 2018-02-14 DIAGNOSIS — Z95810 Presence of automatic (implantable) cardiac defibrillator: Secondary | ICD-10-CM

## 2018-02-14 LAB — CUP PACEART INCLINIC DEVICE CHECK
Battery Remaining Longevity: 49 mo
HIGH POWER IMPEDANCE MEASURED VALUE: 61.875
Implantable Lead Implant Date: 20140408
Implantable Lead Location: 753859
Implantable Pulse Generator Implant Date: 20140408
Lead Channel Impedance Value: 412.5 Ohm
Lead Channel Pacing Threshold Amplitude: 0.5 V
Lead Channel Pacing Threshold Amplitude: 0.5 V
Lead Channel Pacing Threshold Amplitude: 0.75 V
Lead Channel Pacing Threshold Pulse Width: 0.5 ms
Lead Channel Pacing Threshold Pulse Width: 0.5 ms
Lead Channel Sensing Intrinsic Amplitude: 11.7 mV
Lead Channel Sensing Intrinsic Amplitude: 5 mV
Lead Channel Setting Pacing Amplitude: 2.5 V
Lead Channel Setting Pacing Pulse Width: 0.5 ms
MDC IDC LEAD IMPLANT DT: 20140408
MDC IDC LEAD LOCATION: 753860
MDC IDC MSMT LEADCHNL RA PACING THRESHOLD AMPLITUDE: 0.75 V
MDC IDC MSMT LEADCHNL RA PACING THRESHOLD PULSEWIDTH: 0.5 ms
MDC IDC MSMT LEADCHNL RV IMPEDANCE VALUE: 362.5 Ohm
MDC IDC MSMT LEADCHNL RV PACING THRESHOLD PULSEWIDTH: 0.5 ms
MDC IDC SESS DTM: 20190909120930
MDC IDC SET LEADCHNL RA PACING AMPLITUDE: 2 V
MDC IDC SET LEADCHNL RV SENSING SENSITIVITY: 0.5 mV
MDC IDC STAT BRADY RA PERCENT PACED: 26 %
MDC IDC STAT BRADY RV PERCENT PACED: 0.18 %
Pulse Gen Serial Number: 7094248

## 2018-02-14 NOTE — Progress Notes (Signed)
ICD check in clinic. Normal device function. Thresholds and sensing consistent with previous device measurements. Impedance trends stable over time. No evidence of any ventricular arrhythmias. 18 mode switches- all <1 min, AT. Histogram distribution appropriate for patient and level of activity. No changes made this session. Device programmed at appropriate safety margins. Device programmed to optimize intrinsic conduction. Estimated longevity 3.4-4.1 years. ROV with DC 05/16/18.

## 2018-02-19 ENCOUNTER — Other Ambulatory Visit (HOSPITAL_COMMUNITY): Payer: Self-pay | Admitting: Internal Medicine

## 2018-02-24 ENCOUNTER — Other Ambulatory Visit (HOSPITAL_COMMUNITY): Payer: Self-pay

## 2018-02-24 MED ORDER — DIGOXIN 125 MCG PO TABS: 125.0000 ug | ORAL_TABLET | Freq: Every day | ORAL | 2 refills | Status: DC

## 2018-02-27 NOTE — Progress Notes (Addendum)
Advanced Heart Failure Clinic Consult Note   Referring Physician: Jory Sims, NP PCP: [name], MD PCP-Cardiologist: Kirk Ruths, MD  HF: Dr Haroldine Reid  Referring provider: Jory Sims, DNP  HPI:  Louis Reid is a 84 y.o. male with PMH of CAD s/p CABG in 1996 with STEMI in 05/2012 and cath showing patent SVG-OM1-OM2 and SVG-D1 with occluded SVG-RCA and atretic LIMA-LAD with thrombectomy of proximal LAD occlusion, no significant changes on cath in 01/2017), HTN, HLD, ICM (s/p ICD placement in 2014), and chronic combined systolic and diastolic CHF.   Seen in [facility] clinic 01/10/2018 with multiple complaints. This included fatigue and DOE. He had stopped lisinopril due to hypotension.  Repeat echo ordered as below which showed decrease in EF, so referred to HF clinic by Jory Sims for HF evaluation & med optimization.    He presents today for regular follow up. Last visit, coreg was stopped and digoxin was started with concerns for end stage heart failure with low output. Dr Haroldine Reid discussed possibility of home inotropes, but he was not interested. Today he feels horrible. His appetite and energy level are the lowest they've been. Weight is down 11 lbs in 4 weeks. He is exhausted after walking 10 feet. No dizziness. No SOB, orthopnea, or PND. Urinating a lot with lasix. Very little intake. Taking medications. He has lots of concerns about his prognosis and does not feel safe going home alone. Son lives out of town.   [place]: Thoracic impedence okay.   Echo 01/17/18 LVEF 15%, Grade 1 DD, Trivial MR, Mildly reduced RV function, PA peak pressure 32 mm Hg.   Echo 01/20/2017 LVEF 20-25%, Grade 1 DD, Mildly reduced RV function  LHC 01/20/2017  Ost Cx to Prox Cx lesion, 80 %stenosed.  Prox Cx to Mid Cx lesion, 100 %stenosed.  Prox LAD to Dist LAD lesion, 20 %stenosed.  Dist LAD lesion, 30 %stenosed.  Prox RCA lesion, 70 %stenosed.  Prox RCA to Mid RCA  lesion, 30 %stenosed.  Ost 2nd Diag to 2nd Diag lesion, 100 %stenosed.  SVG.  The graft exhibits mild .  SVG.  Origin to Prox Graft lesion, 30 %stenosed.  Mid Graft lesion, 20 %stenosed.  SVG graft was not injected.  Origin lesion, 100 %stenosed.  Review of systems complete and found to be negative unless listed in HPI.   Past Medical History:  Diagnosis Date  . Anemia   . CAD (coronary artery disease)    a. s/p remote CABG, b. AL STEMI c/b VF arrest in 05/2012 => LHC 05/10/12: Severe 3v CAD, S-OM1/2 ok, SVG-Dx ok, SVG-RCA occluded, LIMA-LAD atretic, proximal LAD occluded. EF 20% with anterior apical HK=> salvage PCI with POBA and thrombectomy of the occluded LAD;  c. 01/2017: atretic LIMA_LAD and known occluded SVG-RCA. Patent LAD stent and 70% proxRCA stenosis (FFR 0.88).   . Cataract   . Chronic systolic heart failure ([place])    a. Echo 12/13: EF 20-25%, anteroseptal, inferior, apical HK, mildly reduced RV function, trivial pericardial effusion;   b.  Echo 3/14:  Ant, septal, apical AK, EF 25%  . Clotting disorder ([place])   . Depression   . HTN (hypertension)   . Inguinal hernia    "have one now on my left side" (09/13/2012)  . Ischemic cardiomyopathy   . Kidney stones   . Osteoporosis   . Paroxysmal atrial fibrillation ([place]) 03/17/2016   noted on device interrogation,  will follow burden  . Pectus excavatum   . Pulmonary embolism (  [place])    a. after adhesiolysis for SBO in 04/2012 => coumadin for 6 mos  . SBO (small bowel obstruction) [facility]) November 2013   s/p lysis of adhesions  . Sinoatrial node dysfunction (HCC)   . Ventricular fibrillation ([place]) 05/2012   . in setting of AL STEMI 12/13 => EF 20-25% => d/c on LifeVest (repeat echo planned in 08/2012)    Current Outpatient Medications  Medication Sig Dispense Refill  . aspirin EC 81 MG tablet Take 81 mg by mouth daily.    Marland Kitchen atorvastatin (LIPITOR) 20 MG tablet TAKE 1 TABLET DAILY AT 6 PM 90 tablet 2  . cholecalciferol  (VITAMIN D) 1000 UNITS tablet Take 1,000 Units by mouth daily.    . cyanocobalamin 1000 MCG tablet Take 1,000 mcg by mouth daily.    . digoxin (LANOXIN) 0.125 MG tablet Take 1 tablet (125 mcg total) by mouth daily. 90 tablet 2  . docusate sodium (COLACE) 100 MG capsule Take 100 mg by mouth 2 (two) times daily as needed for constipation.     . furosemide (LASIX) 20 MG tablet Take 20 mg by mouth daily.    Marland Kitchen lisinopril (PRINIVIL,ZESTRIL) 2.5 MG tablet Take 1.25 mg by mouth daily.    . Multiple Vitamins-Minerals (ICAPS AREDS 2 PO) Take 1 tablet by mouth daily.    . nitroGLYCERIN (NITROSTAT) 0.4 MG SL tablet Place 1 tablet (0.4 mg total) under the tongue every 5 (five) minutes x 3 doses as needed for chest pain. Take only as needed for chest pain if your blood pressure is greater than 100/80. 25 tablet 12   No current facility-administered medications for this encounter.     Allergies  Allergen Reactions  . Cold Medicine Plus [Chlorphen-Pseudoephed-Apap]     Reaction a long time ago - made him feel goofy      Social History   Socioeconomic History  . Marital status: Widowed    Spouse name: Not on file  . Number of children: 1  . Years of education: Not on file  . Highest education level: Not on file  Occupational History  . Occupation: [name]    Employer: RETIRED  Social Needs  . Financial resource strain: Not on file  . Food insecurity:    Worry: Not on file    Inability: Not on file  . Transportation needs:    Medical: Not on file    Non-medical: Not on file  Tobacco Use  . Smoking status: Former Smoker    Types: Cigarettes  . Smokeless tobacco: Never Used  . Tobacco comment: 09/14/2011 "quit smoking when I was ~ 17; probably didn't smoke 10 packs in my life"  Substance and Sexual Activity  . Alcohol use: No  . Drug use: No  . Sexual activity: Not Currently  Lifestyle  . Physical activity:    Days per week: Not on file    Minutes per session: Not on file  .  Stress: Not on file  Relationships  . Social connections:    Talks on phone: Not on file    Gets together: Not on file    Attends religious service: Not on file    Active member of club or organization: Not on file    Attends meetings of clubs or organizations: Not on file    Relationship status: Not on file  . Intimate partner violence:    Fear of current or ex partner: Not on file    Emotionally abused: Not on  file    Physically abused: Not on file    Forced sexual activity: Not on file  Other Topics Concern  . Not on file  Social History Narrative  . Not on file    Family History  Problem Relation Age of Onset  . Heart disease Father   . Other Mother        brain tumor  . Colon cancer Brother   . Cancer Brother        Liver  . Heart disease Brother     Vitals:   02/28/18 0938  BP: 118/70  Pulse: 98  SpO2: 96%  Weight: 53.8 kg (118 lb 9.6 oz)   Wt Readings from Last 3 Encounters:  02/28/18 53.8 kg (118 lb 9.6 oz)  01/28/18 58.7 kg (129 lb 8 oz)  01/10/18 59.2 kg (130 lb 9.6 oz)    PHYSICAL EXAM: General: Elderly and thin. No resp difficulty. HEENT: Normal Neck: Supple. JVP flat. Carotids 2+ bilat; no bruits. No thyromegaly or nodule noted. Cor: PMI laterally displaced. IRR, No M/G/R noted. Marked pectus excavatum defect. +S3 Lungs: CTAB, normal effort. Abdomen: Soft, non-tender, non-distended, no HSM. No bruits or masses. +BS  Extremities: No cyanosis, clubbing, or rash. R and LLE no edema, cool.  Neuro: Alert & orientedx3, cranial nerves grossly intact. moves all 4 extremities w/o difficulty. Affect pleasant  EKG: NSR 74 bpm with sinus arrhythmia and PVCs. Personally reviewed.   ASSESSMENT & PLAN:  1. Chronic systolic CHF, ICM - Echo 0/30/09 LVEF 15%, Grade 1 DD, Trivial MR, Mildly reduced RV function, PA peak pressure 32 mm Hg.  - s/p St. Jude ICD  - EF dropped from 20-25% -> 15% from 01/2017 - 2019. Though by visual review it does not appear to have  decreased by much.  - NYHA IIIb symptoms. ?Component of low output.  - Volume status dry on exam. Weight down 11 lbs in 4 weeks. - Hold lasix - Continue digoxin 0.125 mg daily.  - No BB with concern for end stage low output.  - Continue lisinopril 1.25 mg daily. He has been intolerant to higher doses.  - Will admit for RHC tomorrow and possible inotropes. Will also consult palliative care.  2. CAD s/p CABG - Most recent cath 01/2017 with patent SVG to diagonal, patent SVG to OM 2, and OM 3. Patent proximal LAD stent. LIMA to LAD and SVG to RCA known occluded.  - Continue ASA, statin. BB stopped with concern for low output.  - No s/s ischemia. 3. HLD - Continue statin per [facility]. No change.  Discussed with patient, patient's son, and Dr Haroldine Reid. Pt would like to try home milrinone. Will admit today for RHC tomorrow and possible inotropes. Discussed code status. He would like to be intubated if needed, but nothing else. Will have palliative also see him while admitted. He is aware and agreeable.  Georgiana Shore, NP 02/28/18   He has end-stage HF. Not candidate for LVAD or transplant. Discussed Hospice vs palliative inotropes. He would like SNF placement with trial of inotropes. Will admit for RHC and possible inotropes. Consult Palliative Care.   Total time spent 45 minutes. Over half that time spent discussing above.   Glori Bickers, MD  12:27 PM  .

## 2018-02-28 ENCOUNTER — Encounter (HOSPITAL_COMMUNITY): Payer: Self-pay | Admitting: General Practice

## 2018-02-28 ENCOUNTER — Inpatient Hospital Stay (HOSPITAL_COMMUNITY)
Admission: AD | Admit: 2018-02-28 | Discharge: 2018-03-03 | DRG: 287 | Disposition: A | Discharge status: Hospice | Payer: Medicare Other | Source: Ambulatory Visit | Attending: Cardiology | Admitting: Cardiology

## 2018-02-28 ENCOUNTER — Ambulatory Visit (HOSPITAL_COMMUNITY)
Admission: RE | Admit: 2018-02-28 | Discharge: 2018-02-28 | Disposition: A | Discharge status: Home / Self Care | Payer: Medicare Other | Source: Ambulatory Visit | Attending: Internal Medicine | Admitting: Internal Medicine

## 2018-02-28 ENCOUNTER — Inpatient Hospital Stay (HOSPITAL_COMMUNITY): Payer: Medicare Other

## 2018-02-28 ENCOUNTER — Other Ambulatory Visit: Payer: Self-pay

## 2018-02-28 ENCOUNTER — Encounter (HOSPITAL_COMMUNITY): Payer: Self-pay

## 2018-02-28 VITALS — BP 118/70 | HR 98 | Wt 118.6 lb

## 2018-02-28 DIAGNOSIS — Z8249 Family history of ischemic heart disease and other diseases of the circulatory system: Secondary | ICD-10-CM

## 2018-02-28 DIAGNOSIS — Z85828 Personal history of other malignant neoplasm of skin: Secondary | ICD-10-CM

## 2018-02-28 DIAGNOSIS — I5022 Chronic systolic (congestive) heart failure: Secondary | ICD-10-CM | POA: Diagnosis present

## 2018-02-28 DIAGNOSIS — I480 Paroxysmal atrial fibrillation: Secondary | ICD-10-CM | POA: Diagnosis not present

## 2018-02-28 DIAGNOSIS — Z888 Allergy status to other drugs, medicaments and biological substances status: Secondary | ICD-10-CM

## 2018-02-28 DIAGNOSIS — I110 Hypertensive heart disease with heart failure: Secondary | ICD-10-CM | POA: Insufficient documentation

## 2018-02-28 DIAGNOSIS — Z79899 Other long term (current) drug therapy: Secondary | ICD-10-CM

## 2018-02-28 DIAGNOSIS — E785 Hyperlipidemia, unspecified: Secondary | ICD-10-CM | POA: Diagnosis present

## 2018-02-28 DIAGNOSIS — Z955 Presence of coronary angioplasty implant and graft: Secondary | ICD-10-CM

## 2018-02-28 DIAGNOSIS — I5084 End stage heart failure: Secondary | ICD-10-CM

## 2018-02-28 DIAGNOSIS — Z95810 Presence of automatic (implantable) cardiac defibrillator: Secondary | ICD-10-CM

## 2018-02-28 DIAGNOSIS — Z86711 Personal history of pulmonary embolism: Secondary | ICD-10-CM

## 2018-02-28 DIAGNOSIS — Z9079 Acquired absence of other genital organ(s): Secondary | ICD-10-CM

## 2018-02-28 DIAGNOSIS — I5042 Chronic combined systolic (congestive) and diastolic (congestive) heart failure: Secondary | ICD-10-CM

## 2018-02-28 DIAGNOSIS — I252 Old myocardial infarction: Secondary | ICD-10-CM

## 2018-02-28 DIAGNOSIS — Z515 Encounter for palliative care: Secondary | ICD-10-CM

## 2018-02-28 DIAGNOSIS — I255 Ischemic cardiomyopathy: Secondary | ICD-10-CM

## 2018-02-28 DIAGNOSIS — F329 Major depressive disorder, single episode, unspecified: Secondary | ICD-10-CM | POA: Diagnosis not present

## 2018-02-28 DIAGNOSIS — Q676 Pectus excavatum: Secondary | ICD-10-CM | POA: Diagnosis not present

## 2018-02-28 DIAGNOSIS — I25810 Atherosclerosis of coronary artery bypass graft(s) without angina pectoris: Secondary | ICD-10-CM

## 2018-02-28 DIAGNOSIS — I495 Sick sinus syndrome: Secondary | ICD-10-CM | POA: Diagnosis not present

## 2018-02-28 DIAGNOSIS — R64 Cachexia: Secondary | ICD-10-CM | POA: Diagnosis not present

## 2018-02-28 DIAGNOSIS — M810 Age-related osteoporosis without current pathological fracture: Secondary | ICD-10-CM | POA: Diagnosis present

## 2018-02-28 DIAGNOSIS — I2510 Atherosclerotic heart disease of native coronary artery without angina pectoris: Secondary | ICD-10-CM | POA: Insufficient documentation

## 2018-02-28 DIAGNOSIS — Z87891 Personal history of nicotine dependence: Secondary | ICD-10-CM

## 2018-02-28 DIAGNOSIS — E860 Dehydration: Secondary | ICD-10-CM | POA: Diagnosis present

## 2018-02-28 DIAGNOSIS — Z7982 Long term (current) use of aspirin: Secondary | ICD-10-CM

## 2018-02-28 DIAGNOSIS — Z87442 Personal history of urinary calculi: Secondary | ICD-10-CM

## 2018-02-28 DIAGNOSIS — I5023 Acute on chronic systolic (congestive) heart failure: Secondary | ICD-10-CM | POA: Diagnosis not present

## 2018-02-28 DIAGNOSIS — D689 Coagulation defect, unspecified: Secondary | ICD-10-CM | POA: Diagnosis present

## 2018-02-28 DIAGNOSIS — Z681 Body mass index (BMI) 19 or less, adult: Secondary | ICD-10-CM

## 2018-02-28 DIAGNOSIS — I509 Heart failure, unspecified: Secondary | ICD-10-CM

## 2018-02-28 DIAGNOSIS — Z7189 Other specified counseling: Secondary | ICD-10-CM

## 2018-02-28 DIAGNOSIS — Z9889 Other specified postprocedural states: Secondary | ICD-10-CM

## 2018-02-28 DIAGNOSIS — Z951 Presence of aortocoronary bypass graft: Secondary | ICD-10-CM

## 2018-02-28 HISTORY — DX: Unspecified malignant neoplasm of skin, unspecified: C44.90

## 2018-02-28 HISTORY — DX: Acute myocardial infarction, unspecified: I21.9

## 2018-02-28 HISTORY — DX: Essential (primary) hypertension: I10

## 2018-02-28 HISTORY — DX: Cyst of kidney, acquired: N28.1

## 2018-02-28 HISTORY — DX: Presence of automatic (implantable) cardiac defibrillator: Z95.810

## 2018-02-28 LAB — CBC
HCT: 46.4 % (ref 39.0–52.0)
Hemoglobin: 14.6 g/dL (ref 13.0–17.0)
MCH: 31.7 pg (ref 26.0–34.0)
MCHC: 31.5 g/dL (ref 30.0–36.0)
MCV: 100.7 fL — AB (ref 78.0–100.0)
PLATELETS: 159 10*3/uL (ref 150–400)
RBC: 4.61 MIL/uL (ref 4.22–5.81)
RDW: 14.3 % (ref 11.5–15.5)
WBC: 7.4 10*3/uL (ref 4.0–10.5)

## 2018-02-28 LAB — BASIC METABOLIC PANEL
Anion gap: 11 (ref 5–15)
BUN: 26 mg/dL — ABNORMAL HIGH (ref 8–23)
CALCIUM: 9.1 mg/dL (ref 8.9–10.3)
CHLORIDE: 103 mmol/L (ref 98–111)
CO2: 27 mmol/L (ref 22–32)
CREATININE: 1.39 mg/dL — AB (ref 0.61–1.24)
GFR calc Af Amer: 52 mL/min — ABNORMAL LOW (ref >60)
GFR calc non Af Amer: 45 mL/min — ABNORMAL LOW (ref >60)
Glucose, Bld: 102 mg/dL — ABNORMAL HIGH (ref 70–99)
Potassium: 4.3 mmol/L (ref 3.5–5.1)
SODIUM: 141 mmol/L (ref 135–145)

## 2018-02-28 LAB — BRAIN NATRIURETIC PEPTIDE: B Natriuretic Peptide: 275.3 pg/mL — ABNORMAL HIGH (ref 0.0–100.0)

## 2018-02-28 LAB — PROTIME-INR
INR: 1.09
Prothrombin Time: 14 seconds (ref 11.4–15.2)

## 2018-02-28 LAB — DIGOXIN LEVEL: Digoxin Level: 1.9 ng/mL (ref 0.8–2.0)

## 2018-02-28 MED ORDER — ACETAMINOPHEN 325 MG PO TABS: 650.0000 mg | ORAL_TABLET | ORAL | Status: DC | PRN

## 2018-02-28 MED ORDER — VITAMIN B-12 1000 MCG PO TABS
1000.0000 ug | ORAL_TABLET | Freq: Every day | ORAL | Status: DC
  Administered 2018-03-01 – 2018-03-03 (×3): 1000 ug via ORAL
  Filled 2018-02-28 (×3): qty 1

## 2018-02-28 MED ORDER — DIGOXIN 125 MCG PO TABS: 125.0000 ug | ORAL_TABLET | Freq: Every day | ORAL | Status: DC

## 2018-02-28 MED ORDER — ASPIRIN EC 81 MG PO TBEC: 81.0000 mg | DELAYED_RELEASE_TABLET | Freq: Every day | ORAL | Status: DC

## 2018-02-28 MED ORDER — ENSURE ENLIVE PO LIQD
237.0000 mL | Freq: Two times a day (BID) | ORAL | Status: DC
  Administered 2018-03-01 (×2): 237 mL via ORAL

## 2018-02-28 MED ORDER — ASPIRIN EC 81 MG PO TBEC
81.0000 mg | DELAYED_RELEASE_TABLET | Freq: Every day | ORAL | Status: DC
  Administered 2018-03-01 – 2018-03-02 (×2): 81 mg via ORAL
  Filled 2018-02-28 (×3): qty 1

## 2018-02-28 MED ORDER — ICAPS AREDS 2 PO CAPS: 1.0000 | ORAL_CAPSULE | Freq: Every day | ORAL | Status: DC

## 2018-02-28 MED ORDER — VITAMIN D 1000 UNITS PO TABS
1000.0000 [IU] | ORAL_TABLET | Freq: Every day | ORAL | Status: DC
  Administered 2018-03-01 – 2018-03-03 (×3): 1000 [IU] via ORAL
  Filled 2018-02-28 (×2): qty 1

## 2018-02-28 MED ORDER — SODIUM CHLORIDE 0.9 % IV SOLN
INTRAVENOUS | Status: DC
  Administered 2018-03-01: 07:00:00 via INTRAVENOUS

## 2018-02-28 MED ORDER — HEPARIN SODIUM (PORCINE) 5000 UNIT/ML IJ SOLN
5000.0000 [IU] | Freq: Three times a day (TID) | INTRAMUSCULAR | Status: DC
  Administered 2018-02-28 – 2018-03-01 (×2): 5000 [IU] via SUBCUTANEOUS
  Filled 2018-02-28 (×2): qty 1

## 2018-02-28 MED ORDER — ATORVASTATIN CALCIUM 20 MG PO TABS
20.0000 mg | ORAL_TABLET | Freq: Every day | ORAL | Status: DC
  Administered 2018-02-28 – 2018-03-02 (×3): 20 mg via ORAL
  Filled 2018-02-28 (×3): qty 1

## 2018-02-28 MED ORDER — ONDANSETRON HCL 4 MG/2ML IJ SOLN: 4.0000 mg | Freq: Four times a day (QID) | INTRAMUSCULAR | Status: DC | PRN

## 2018-02-28 MED ORDER — DOCUSATE SODIUM 100 MG PO CAPS
100.0000 mg | ORAL_CAPSULE | Freq: Two times a day (BID) | ORAL | Status: DC
  Administered 2018-02-28 – 2018-03-03 (×6): 100 mg via ORAL
  Filled 2018-02-28 (×6): qty 1

## 2018-02-28 NOTE — Progress Notes (Signed)
CSW informed by NP in clinic that pt inquiring about Medicare benefits and SNF vs Home Health placement.  CSW met with pt and pt son to answer questions.  Explained that with pt managed Medicare that there is NOT at 3 night stay requirement.  Informed pt that ability to go to SNF or get home health services would be dependent on getting approval from insurance.  Patient being admitted for further work up so CSW encouraged pt to follow up with inpatient team who will be able to assist with placement vs home health depending on needs at time of DC.  Jorge Ny, LCSW Clinical Social Worker

## 2018-02-28 NOTE — H&P (Addendum)
Advanced Heart Failure Team History and Physical Note   PCP:  [name], MD  PCP-Cardiology: Kirk Ruths, MD     Reason for Admission: Low output heart fa ilure   HPI:    Louis Reid is a 84 y.o. male with PMH of CAD s/p CABG in 1996 with STEMI in 05/2012 and cath showing patent SVG-OM1-OM2 and SVG-D1 with occluded SVG-RCA and atretic LIMA-LAD with thrombectomy of proximal LAD occlusion, no significant changes on cath in 01/2017), HTN, HLD, ICM (s/p ICD placement in 2014), and chronic combined systolic and diastolic CHF.   Seen in [facility] clinic 01/10/2018 with multiple complaints. This included fatigue and DOE. He had stopped lisinopril due to hypotension.  Repeat echo ordered as below which showed decrease in EF, so referred to HF clinic by Jory Sims for HF evaluation & med optimization.    Seen in HF clinic today. Last visit, coreg was stopped and digoxin was started with concerns for end stage heart failure with low output. Dr Haroldine Laws discussed possibility of home inotropes, but he was not interested. Today he feels horrible. His appetite and energy level are the lowest they've been. Weight is down 11 lbs in 4 weeks. He is exhausted after walking 10 feet. No dizziness. No SOB, orthopnea, or PND. Urinating a lot with lasix. Very little intake. Taking medications. He has lots of concerns about his prognosis and does not feel safe going home alone. Son lives out of town.   He will be admitted for RHC tomorrow and possible initiation of inotropes.   [place]: Thoracic impedence okay. 2% AF/AT burden. Will have rep do a full interrogation.  Echo 01/17/18 LVEF 15%, Grade 1 DD, Trivial MR, Mildly reduced RV function, PA peak pressure 32 mm Hg.   Echo 01/20/2017 LVEF 20-25%, Grade 1 DD, Mildly reduced RV function  LHC 01/20/2017  Ost Cx to Prox Cx lesion, 80 %stenosed.  Prox Cx to Mid Cx lesion, 100 %stenosed.  Prox LAD to Dist LAD lesion, 20 %stenosed.  Dist  LAD lesion, 30 %stenosed.  Prox RCA lesion, 70 %stenosed.  Prox RCA to Mid RCA lesion, 30 %stenosed.  Ost 2nd Diag to 2nd Diag lesion, 100 %stenosed.  SVG.  The graft exhibits mild .  SVG.  Origin to Prox Graft lesion, 30 %stenosed.  Mid Graft lesion, 20 %stenosed.  SVG graft was not injected.  Origin lesion, 100 %stenosed.  Review of Systems: [y] = yes, [ ]  = no   General: Weight gain [ ] ; Weight loss Blue.Reese ]; Anorexia [ y]; Fatigue Blue.Reese ]; Fever [ ] ; Chills [ ] ; Weakness Blue.Reese ]  Cardiac: Chest pain/pressure [ ] ; Resting SOB [ ] ; Exertional SOB [ ] ; Orthopnea [ ] ; Pedal Edema [ ] ; Palpitations [ ] ; Syncope [ ] ; Presyncope [ ] ; Paroxysmal nocturnal dyspnea[ ]   Pulmonary: Cough [ ] ; Wheezing[ ] ; Hemoptysis[ ] ; Sputum [ ] ; Snoring [ ]   GI: Vomiting[ ] ; Dysphagia[ ] ; Melena[ ] ; Hematochezia [ ] ; Heartburn[ ] ; Abdominal pain [ ] ; Constipation [ ] ; Diarrhea [ ] ; BRBPR [ ]   GU: Hematuria[ ] ; Dysuria [ ] ; Nocturia[ ]   Vascular: Pain in legs with walking [ y]; Pain in feet with lying flat [ ] ; Non-healing sores [ ] ; Stroke [ ] ; TIA [ ] ; Slurred speech [ ] ;  Neuro: Headaches[ ] ; Vertigo[ ] ; Seizures[ ] ; Paresthesias[ ] ;Blurred vision [ ] ; Diplopia [ ] ; Vision changes [ ]   Ortho/Skin: Arthritis Blue.Reese ]; Joint pain [ y]; Muscle pain [ ] ; Joint  swelling [ ] ; Back Pain [ ] ; Rash [ ]   Psych: Depression[ ] ; Anxiety[ ]   Heme: Bleeding problems [ ] ; Clotting disorders [ ] ; Anemia [ ]   Endocrine: Diabetes [ ] ; Thyroid dysfunction[ ]    Home Medications Prior to Admission medications   Medication Sig Start Date End Date Taking? Authorizing Provider  aspirin EC 81 MG tablet Take 81 mg by mouth daily.    [provider]  atorvastatin (LIPITOR) 20 MG tablet TAKE 1 TABLET DAILY AT 6 PM 08/09/17   Lelon Perla, MD  cholecalciferol (VITAMIN D) 1000 UNITS tablet Take 1,000 Units by mouth daily.    [provider]  cyanocobalamin 1000 MCG tablet Take 1,000 mcg by mouth daily.    [provider]  digoxin (LANOXIN) 0.125 MG tablet Take 1 tablet (125 mcg total) by mouth daily. 02/24/18   Travas Schexnayder, Shaune Pascal, MD  docusate sodium (COLACE) 100 MG capsule Take 100 mg by mouth 2 (two) times daily as needed for constipation.     [provider]  furosemide (LASIX) 20 MG tablet Take 20 mg by mouth daily.    [provider]  lisinopril (PRINIVIL,ZESTRIL) 2.5 MG tablet Take 1.25 mg by mouth daily.    [provider]  Multiple Vitamins-Minerals (ICAPS AREDS 2 PO) Take 1 tablet by mouth daily.    [provider]  nitroGLYCERIN (NITROSTAT) 0.4 MG SL tablet Place 1 tablet (0.4 mg total) under the tongue every 5 (five) minutes x 3 doses as needed for chest pain. Take only as needed for chest pain if your blood pressure is greater than 100/80. Patient not taking: Reported on 02/28/2018 01/22/17   Delos Haring, [name]    Past Medical History: Past Medical History:  Diagnosis Date  . Anemia   . CAD (coronary artery disease)    a. s/p remote CABG, b. AL STEMI c/b VF arrest in 05/2012 => LHC 05/10/12: Severe 3v CAD, S-OM1/2 ok, SVG-Dx ok, SVG-RCA occluded, LIMA-LAD atretic, proximal LAD occluded. EF 20% with anterior apical HK=> salvage PCI with POBA and thrombectomy of the occluded LAD;  c. 01/2017: atretic LIMA_LAD and known occluded SVG-RCA. Patent LAD stent and 70% proxRCA stenosis (FFR 0.88).   . Cataract   . Chronic systolic heart failure ([place])    a. Echo 12/13: EF 20-25%, anteroseptal, inferior, apical HK, mildly reduced RV function, trivial pericardial effusion;   b.  Echo 3/14:  Ant, septal, apical AK, EF 25%  . Clotting disorder ([place])   . Depression   . HTN (hypertension)   . Inguinal hernia    "have one now on my left side" (09/13/2012)  . Ischemic cardiomyopathy   . Kidney stones   . Osteoporosis   . Paroxysmal atrial fibrillation ([place]) 03/17/2016   noted on device interrogation,  will follow burden  . Pectus excavatum   . Pulmonary embolism  ([place])    a. after adhesiolysis for SBO in 04/2012 => coumadin for 6 mos  . SBO (small bowel obstruction) [facility]) November 2013   s/p lysis of adhesions  . Sinoatrial node dysfunction (HCC)   . Ventricular fibrillation ([place]) 05/2012   . in setting of AL STEMI 12/13 => EF 20-25% => d/c on LifeVest (repeat echo planned in 08/2012)    Past Surgical History: Past Surgical History:  Procedure Laterality Date  . COLONOSCOPY    . CORONARY ARTERY BYPASS GRAFT  1996   "CABG X5" (09/13/2012)  . CYSTOSCOPY/RETROGRADE/URETEROSCOPY/STONE EXTRACTION WITH BASKET  1990's  . ESOPHAGOGASTRODUODENOSCOPY  05/08/2012   Procedure: ESOPHAGOGASTRODUODENOSCOPY (EGD);  Surgeon: Juanita Craver, MD;  Location[facility] ENDOSCOPY;  Service: Endoscopy;  Laterality: N/A;  Nevin Bloodgood put on for Dr. Collene Mares , Dr. Collene Mares will call if she wants another time  . IMPLANTABLE CARDIOVERTER DEFIBRILLATOR IMPLANT N/A 09/13/2012   SJM Ellipse ER implanted by Dr Rayann Heman for secondary prevention  . INGUINAL HERNIA REPAIR Right 1996  . INTRAVASCULAR PRESSURE WIRE/FFR STUDY N/A 01/20/2017   Procedure: INTRAVASCULAR PRESSURE WIRE/FFR STUDY;  Surgeon: Wellington Hampshire, MD;  Location: [place] CV LAB;  Service: Cardiovascular;  Laterality: N/A;  . LAPAROTOMY  04/27/2012   Procedure: EXPLORATORY LAPAROTOMY;  Surgeon: Gwenyth Ober, MD;  Location: [place];  Service: General;  Laterality: N/A;  . LEFT HEART CATH AND CORS/GRAFTS ANGIOGRAPHY N/A 01/20/2017   Procedure: LEFT HEART CATH AND CORS/GRAFTS ANGIOGRAPHY;  Surgeon: Wellington Hampshire, MD;  Location: [place] CV LAB;  Service: Cardiovascular;  Laterality: N/A;  . LEFT HEART CATHETERIZATION WITH CORONARY ANGIOGRAM N/A 05/10/2012   Procedure: LEFT HEART CATHETERIZATION WITH CORONARY ANGIOGRAM;  Surgeon: Burnell Blanks, MD;  Location: [facility] CATH LAB;  Service: Cardiovascular;  Laterality: N/A;  . PTCA     percutaneous transluminal coronary intervention and brachy therapy, Bruce R. Olevia Perches, MD. EF 60%  .  TRANSURETHRAL RESECTION OF PROSTATE      Family History:  Family History  Problem Relation Age of Onset  . Heart disease Father   . Other Mother        brain tumor  . Colon cancer Brother   . Cancer Brother        Liver  . Heart disease Brother     Social History: Social History   Socioeconomic History  . Marital status: Widowed    Spouse name: Not on file  . Number of children: 1  . Years of education: Not on file  . Highest education level: Not on file  Occupational History  . Occupation: [name]    Employer: RETIRED  Social Needs  . Financial resource strain: Not on file  . Food insecurity:    Worry: Not on file    Inability: Not on file  . Transportation needs:    Medical: Not on file    Non-medical: Not on file  Tobacco Use  . Smoking status: Former Smoker    Types: Cigarettes  . Smokeless tobacco: Never Used  . Tobacco comment: 09/14/2011 "quit smoking when I was ~ 17; probably didn't smoke 10 packs in my life"  Substance and Sexual Activity  . Alcohol use: No  . Drug use: No  . Sexual activity: Not Currently  Lifestyle  . Physical activity:    Days per week: Not on file    Minutes per session: Not on file  . Stress: Not on file  Relationships  . Social connections:    Talks on phone: Not on file    Gets together: Not on file    Attends religious service: Not on file    Active member of club or organization: Not on file    Attends meetings of clubs or organizations: Not on file    Relationship status: Not on file  Other Topics Concern  . Not on file  Social History Narrative  . Not on file    Allergies:  Allergies  Allergen Reactions  . Cold Medicine Plus [Chlorphen-Pseudoephed-Apap]     Reaction a long time ago - made him feel goofy    Objective:  Vital Signs:   118/70, HR 98, O2 96% on RA  Physical Exam     General:  Elderly and thin. No respiratory difficulty HEENT: Normal Neck: Supple. no JVD. Carotids 2+ bilat; no  bruits. No lymphadenopathy or thyromegaly appreciated. Cor: PMI laterally displaced. Irregular rate & rhythm. No rubs, gallops or murmurs. Marked pectus excavatum defect. +S3 Lungs: Clear Abdomen: Soft, nontender, nondistended. No hepatosplenomegaly. No bruits or masses. Good bowel sounds. Extremities: No cyanosis, clubbing, rash, edema, cool. Neuro: Alert & oriented x 3, cranial nerves grossly intact. moves all 4 extremities w/o difficulty. Affect pleasant.   Telemetry   Pending  EKG   EKG today: NSR 74 bpm with sinus arrhythmia and PVCs. Personally reviewed.   Labs     Basic Metabolic Panel: Recent Labs  Lab 02/28/18 1244  NA 141  K 4.3  CL 103  CO2 27  GLUCOSE 102*  BUN 26*  CREATININE 1.39*  CALCIUM 9.1    Liver Function Tests: No results for input(s): AST, ALT, ALKPHOS, BILITOT, PROT, ALBUMIN in the last 168 hours. No results for input(s): LIPASE, AMYLASE in the last 168 hours. No results for input(s): AMMONIA in the last 168 hours.  CBC: Recent Labs  Lab 02/28/18 1245  WBC 7.4  HGB 14.6  HCT 46.4  MCV 100.7*  PLT 159    Cardiac Enzymes: No results for input(s): CKTOTAL, CKMB, CKMBINDEX, TROPONINI in the last 168 hours.  BNP: BNP (last 3 results) No results for input(s): BNP in the last 8760 hours.  ProBNP (last 3 results) No results for input(s): PROBNP in the last 8760 hours.   CBG: No results for input(s): GLUCAP in the last 168 hours.  Coagulation Studies: Recent Labs    02/28/18 1244  LABPROT 14.0  INR 1.09    Imaging:  No results found.   Patient Profile   Louis Reid is a 84 y.o. male with PMH of CAD s/p CABG in 1996 with STEMI in 05/2012 and cath showing patent SVG-OM1-OM2 and SVG-D1 with occluded SVG-RCA and atretic LIMA-LAD with thrombectomy of proximal LAD occlusion, no significant changes on cath in 01/2017), HTN, HLD, ICM (s/p ICD placement in 2014), and chronic combined systolic and diastolic CHF.   Admitted from HF  clinic with concern for low output in end stage heart failure.   Assessment/Plan   1. Chronic systolic CHF, ICM - Echo 5/73/22 LVEF 15%, Grade 1 DD, Trivial MR, Mildly reduced RV function, PA peak pressure 32 mm Hg.  - s/p St. Jude ICD  - EF dropped from 20-25% -> 15% from 01/2017 - 2019. Though by visual review it does not appear to have decreased by much.  - Volume status dry on exam. Weight down 11 lbs in 4 weeks. - Hold lasix. - Continue digoxin 0.125 mg daily.  - No BB with concern for low output.  - Continue lisinopril 1.25 mg daily. He has been intolerant to higher doses. Pt would like to hold to see if this helps his symptoms. - Will admit for RHC tomorrow and possible inotropes.  - Palliative care consulted. - He is not a candidate for home inotropes 2. CAD s/p CABG - Most recent cath 01/2017 with patent SVG to diagonal, patent SVG to OM 2, and OM 3. Patent proximal LAD stent. LIMA to LAD and SVG to RCA known occluded.  - Continue ASA, statin. BB stopped with concern for low output.  - No s/s ischemia. 3. HLD - Continue statin per [facility]. No  change. 4. Disposition - Dr Haroldine Laws discussed home inotropes vs hospice. He would like to see if he is a candidate for home inotropes. May need SNF at DC. PT consulted. If not a candidate for inotropes, will likely need hospice. - He is a limited code. He would like intubation, but nothing else.   Discussed with patient, patient's son, and Dr Haroldine Laws. Pt would like to try home milrinone. Will admit today for RHC tomorrow and possible inotropes. Discussed code status. He would like to be intubated if needed, but nothing else. Will have palliative also see him while admitted. He is aware and agreeable.  Addendum: ICD interrogation completed by rep. He was in afib for 6 hours on 9/13. Will discuss with Dr Haroldine Laws.   Georgiana Shore, NP 02/28/2018, 1:21 PM  Advanced Heart Failure Team Pager [phone] (M-F; 7a - 4p)  Please contact [facility] for night-coverage after hours (4p -7a ) and weekends on [email or web address]  Patient seen and examined with the above-signed Advanced Practice Provider and/or Housestaff. I personally reviewed laboratory data, imaging studies and relevant notes. I independently examined the patient and formulated the important aspects of the plan. I have edited the note to reflect any of my changes or salient points. I have personally discussed the plan with the patient and/or family.  Extensive discussions had with him and his son about his situation. He severe ischemic CM and now with end-stage HF. With age and pectus deformity, he is not candidate for LVAD or transplant. Discussed Hospice vs palliative inotropes. He would like SNF placement with trial of inotropes. Will admit for RHC and possible inotropes. Consult Palliative Care.    Glori Bickers, MD  12:27 PM

## 2018-02-28 NOTE — Social Work (Signed)
CSW acknowledging warm hand off from CSW in Heart Failure clinic- will follow inpatient for therapy recommendations.   Alexander Mt, [place] Work [phone]

## 2018-02-28 NOTE — Progress Notes (Deleted)
HPI: FU coronary artery disease and ischemic cardiomyopathy. Patient is status post coronary artery bypass graft in 1996. Patient had an anterior infarct in December of 9470 complicated by ventricular fibrillation arrest. The patient had PCI of his LAD. Course complicated by hypoxic encephalopathy. He is status post ICD 4/14.   Cardiac catheterization August 2018 showed severe three-vessel coronary disease with patent saphenous vein graft to the diagonal, patent sequential saphenous vein graft to second and third marginals, patent LAD stent, atretic LIMA to the LAD and occluded saphenous vein graft to the right coronary artery.  There was a 70% proximal right coronary artery stenosis and FFR was 0.88.  Left ventricular end-diastolic pressure was normal.  Cardiac medications were previously discontinued because of hypotension. Echocardiogram August 2019 showed ejection fraction 15%.  Mild diastolic dysfunction.  Now followed in the CHF clinic.  He is on very low-dose ACE inhibitor and beta-blocker was discontinued.  Home milrinone discussed at last office visit but patient declined at that time.  Since last seen,   Current Outpatient Medications  Medication Sig Dispense Refill  . aspirin EC 81 MG tablet Take 81 mg by mouth daily.    Marland Kitchen atorvastatin (LIPITOR) 20 MG tablet TAKE 1 TABLET DAILY AT 6 PM 90 tablet 2  . cholecalciferol (VITAMIN D) 1000 UNITS tablet Take 1,000 Units by mouth daily.    . cyanocobalamin 1000 MCG tablet Take 1,000 mcg by mouth daily.    . digoxin (LANOXIN) 0.125 MG tablet Take 1 tablet (125 mcg total) by mouth daily. 90 tablet 2  . docusate sodium (COLACE) 100 MG capsule Take 100 mg by mouth 2 (two) times daily as needed for constipation.     . furosemide (LASIX) 20 MG tablet Take 20 mg by mouth daily.    Marland Kitchen lisinopril (PRINIVIL,ZESTRIL) 2.5 MG tablet Take 1.25 mg by mouth daily.    . Multiple Vitamins-Minerals (ICAPS AREDS 2 PO) Take 1 tablet by mouth daily.    .  nitroGLYCERIN (NITROSTAT) 0.4 MG SL tablet Place 1 tablet (0.4 mg total) under the tongue every 5 (five) minutes x 3 doses as needed for chest pain. Take only as needed for chest pain if your blood pressure is greater than 100/80. 25 tablet 12   No current facility-administered medications for this visit.      Past Medical History:  Diagnosis Date  . Anemia   . CAD (coronary artery disease)    a. s/p remote CABG, b. AL STEMI c/b VF arrest in 05/2012 => LHC 05/10/12: Severe 3v CAD, S-OM1/2 ok, SVG-Dx ok, SVG-RCA occluded, LIMA-LAD atretic, proximal LAD occluded. EF 20% with anterior apical HK=> salvage PCI with POBA and thrombectomy of the occluded LAD;  c. 01/2017: atretic LIMA_LAD and known occluded SVG-RCA. Patent LAD stent and 70% proxRCA stenosis (FFR 0.88).   . Cataract   . Chronic systolic heart failure ([place])    a. Echo 12/13: EF 20-25%, anteroseptal, inferior, apical HK, mildly reduced RV function, trivial pericardial effusion;   b.  Echo 3/14:  Ant, septal, apical AK, EF 25%  . Clotting disorder ([place])   . Depression   . HTN (hypertension)   . Inguinal hernia    "have one now on my left side" (09/13/2012)  . Ischemic cardiomyopathy   . Kidney stones   . Osteoporosis   . Paroxysmal atrial fibrillation ([place]) 03/17/2016   noted on device interrogation,  will follow burden  . Pectus excavatum   . Pulmonary embolism ([place])  a. after adhesiolysis for SBO in 04/2012 => coumadin for 6 mos  . SBO (small bowel obstruction) [facility]) November 2013   s/p lysis of adhesions  . Sinoatrial node dysfunction (HCC)   . Ventricular fibrillation ([place]) 05/2012   . in setting of AL STEMI 12/13 => EF 20-25% => d/c on LifeVest (repeat echo planned in 08/2012)    Past Surgical History:  Procedure Laterality Date  . COLONOSCOPY    . CORONARY ARTERY BYPASS GRAFT  1996   "CABG X5" (09/13/2012)  . CYSTOSCOPY/RETROGRADE/URETEROSCOPY/STONE EXTRACTION WITH BASKET  1990's  . ESOPHAGOGASTRODUODENOSCOPY   05/08/2012   Procedure: ESOPHAGOGASTRODUODENOSCOPY (EGD);  Surgeon: Juanita Craver, MD;  Location: [facility] ENDOSCOPY;  Service: Endoscopy;  Laterality: N/A;  Nevin Bloodgood put on for Dr. Collene Mares , Dr. Collene Mares will call if she wants another time  . IMPLANTABLE CARDIOVERTER DEFIBRILLATOR IMPLANT N/A 09/13/2012   SJM Ellipse ER implanted by Dr Rayann Heman for secondary prevention  . INGUINAL HERNIA REPAIR Right 1996  . INTRAVASCULAR PRESSURE WIRE/FFR STUDY N/A 01/20/2017   Procedure: INTRAVASCULAR PRESSURE WIRE/FFR STUDY;  Surgeon: Wellington Hampshire, MD;  Location: [place] CV LAB;  Service: Cardiovascular;  Laterality: N/A;  . LAPAROTOMY  04/27/2012   Procedure: EXPLORATORY LAPAROTOMY;  Surgeon: Gwenyth Ober, MD;  Location: [place];  Service: General;  Laterality: N/A;  . LEFT HEART CATH AND CORS/GRAFTS ANGIOGRAPHY N/A 01/20/2017   Procedure: LEFT HEART CATH AND CORS/GRAFTS ANGIOGRAPHY;  Surgeon: Wellington Hampshire, MD;  Location: [place] CV LAB;  Service: Cardiovascular;  Laterality: N/A;  . LEFT HEART CATHETERIZATION WITH CORONARY ANGIOGRAM N/A 05/10/2012   Procedure: LEFT HEART CATHETERIZATION WITH CORONARY ANGIOGRAM;  Surgeon: Burnell Blanks, MD;  Location: [facility] CATH LAB;  Service: Cardiovascular;  Laterality: N/A;  . PTCA     percutaneous transluminal coronary intervention and brachy therapy, Bruce R. Olevia Perches, MD. EF 60%  . TRANSURETHRAL RESECTION OF PROSTATE      Social History   Socioeconomic History  . Marital status: Widowed    Spouse name: Not on file  . Number of children: 1  . Years of education: Not on file  . Highest education level: Not on file  Occupational History  . Occupation: [name]    Employer: RETIRED  Social Needs  . Financial resource strain: Not on file  . Food insecurity:    Worry: Not on file    Inability: Not on file  . Transportation needs:    Medical: Not on file    Non-medical: Not on file  Tobacco Use  . Smoking status: Former Smoker    Types: Cigarettes    . Smokeless tobacco: Never Used  . Tobacco comment: 09/14/2011 "quit smoking when I was ~ 17; probably didn't smoke 10 packs in my life"  Substance and Sexual Activity  . Alcohol use: No  . Drug use: No  . Sexual activity: Not Currently  Lifestyle  . Physical activity:    Days per week: Not on file    Minutes per session: Not on file  . Stress: Not on file  Relationships  . Social connections:    Talks on phone: Not on file    Gets together: Not on file    Attends religious service: Not on file    Active member of club or organization: Not on file    Attends meetings of clubs or organizations: Not on file    Relationship status: Not on file  . Intimate partner violence:    Fear of  current or ex partner: Not on file    Emotionally abused: Not on file    Physically abused: Not on file    Forced sexual activity: Not on file  Other Topics Concern  . Not on file  Social History Narrative  . Not on file    Family History  Problem Relation Age of Onset  . Heart disease Father   . Other Mother        brain tumor  . Colon cancer Brother   . Cancer Brother        Liver  . Heart disease Brother     ROS: no fevers or chills, productive cough, hemoptysis, dysphasia, odynophagia, melena, hematochezia, dysuria, hematuria, rash, seizure activity, orthopnea, PND, pedal edema, claudication. Remaining systems are negative.  Physical Exam: Well-developed well-nourished in no acute distress.  Skin is warm and dry.  HEENT is normal.  Neck is supple.  Chest is clear to auscultation with normal expansion.  Cardiovascular exam is regular rate and rhythm.  Abdominal exam nontender or distended. No masses palpated. Extremities show no edema. neuro grossly intact  ECG- personally reviewed  A/P  1 coronary artery disease status post coronary artery bypass graft-patient denies chest pain.  Continue medical therapy with aspirin and statin.  2 chronic systolic congestive heart  failure/ICM-patient is now being followed in the CHF clinic.  Beta-blocker was discontinued due to low output.  Continue digoxin and low-dose lisinopril.  There is discussion concerning right heart catheterization and initiation of home inotropes.  Also note palliative care has been consulted.  Patient is not on diuretics.  He is not volume overloaded today.  3 hyperlipidemia-continue statin.  4 prior ICD-followed by electrophysiology.  Kirk Ruths, MD

## 2018-03-01 ENCOUNTER — Ambulatory Visit (HOSPITAL_COMMUNITY): Admission: RE | Admit: 2018-03-01 | Payer: Medicare Other | Source: Ambulatory Visit | Admitting: Internal Medicine

## 2018-03-01 ENCOUNTER — Encounter (HOSPITAL_COMMUNITY): Payer: Self-pay | Admitting: Internal Medicine

## 2018-03-01 ENCOUNTER — Encounter (HOSPITAL_COMMUNITY)
Admission: AD | Disposition: A | Discharge status: Hospice | Payer: Self-pay | Source: Ambulatory Visit | Attending: Internal Medicine

## 2018-03-01 DIAGNOSIS — I5042 Chronic combined systolic (congestive) and diastolic (congestive) heart failure: Secondary | ICD-10-CM | POA: Diagnosis not present

## 2018-03-01 DIAGNOSIS — I5022 Chronic systolic (congestive) heart failure: Secondary | ICD-10-CM | POA: Diagnosis not present

## 2018-03-01 DIAGNOSIS — I5084 End stage heart failure: Secondary | ICD-10-CM | POA: Diagnosis not present

## 2018-03-01 DIAGNOSIS — Z515 Encounter for palliative care: Secondary | ICD-10-CM

## 2018-03-01 DIAGNOSIS — I5040 Unspecified combined systolic (congestive) and diastolic (congestive) heart failure: Secondary | ICD-10-CM | POA: Diagnosis not present

## 2018-03-01 DIAGNOSIS — I480 Paroxysmal atrial fibrillation: Secondary | ICD-10-CM | POA: Diagnosis not present

## 2018-03-01 DIAGNOSIS — I255 Ischemic cardiomyopathy: Secondary | ICD-10-CM | POA: Diagnosis not present

## 2018-03-01 DIAGNOSIS — Z7189 Other specified counseling: Secondary | ICD-10-CM

## 2018-03-01 DIAGNOSIS — I5023 Acute on chronic systolic (congestive) heart failure: Secondary | ICD-10-CM | POA: Diagnosis not present

## 2018-03-01 DIAGNOSIS — I110 Hypertensive heart disease with heart failure: Secondary | ICD-10-CM | POA: Diagnosis not present

## 2018-03-01 HISTORY — PX: RIGHT HEART CATH: CATH118263

## 2018-03-01 LAB — BASIC METABOLIC PANEL
Anion gap: 7 (ref 5–15)
BUN: 26 mg/dL — AB (ref 8–23)
CALCIUM: 8.8 mg/dL — AB (ref 8.9–10.3)
CO2: 29 mmol/L (ref 22–32)
CREATININE: 1.4 mg/dL — AB (ref 0.61–1.24)
Chloride: 106 mmol/L (ref 98–111)
GFR calc Af Amer: 52 mL/min — ABNORMAL LOW (ref >60)
GFR calc non Af Amer: 45 mL/min — ABNORMAL LOW (ref >60)
Glucose, Bld: 86 mg/dL (ref 70–99)
Potassium: 3.9 mmol/L (ref 3.5–5.1)
Sodium: 142 mmol/L (ref 135–145)

## 2018-03-01 LAB — POCT I-STAT 3, VENOUS BLOOD GAS (G3P V)
ACID-BASE DEFICIT: 1 mmol/L (ref 0.0–2.0)
ACID-BASE EXCESS: 1 mmol/L (ref 0.0–2.0)
Acid-Base Excess: 1 mmol/L (ref 0.0–2.0)
BICARBONATE: 24.3 mmol/L (ref 20.0–28.0)
Bicarbonate: 26.3 mmol/L (ref 20.0–28.0)
Bicarbonate: 26.3 mmol/L (ref 20.0–28.0)
O2 SAT: 70 %
O2 SAT: 72 %
O2 SAT: 76 %
PCO2 VEN: 42.5 mmHg — AB (ref 44.0–60.0)
TCO2: 25 mmol/L (ref 22–32)
TCO2: 28 mmol/L (ref 22–32)
TCO2: 28 mmol/L (ref 22–32)
pCO2, Ven: 40.1 mmHg — ABNORMAL LOW (ref 44.0–60.0)
pCO2, Ven: 43.5 mmHg — ABNORMAL LOW (ref 44.0–60.0)
pH, Ven: 7.389 (ref 7.250–7.430)
pH, Ven: 7.389 (ref 7.250–7.430)
pH, Ven: 7.399 (ref 7.250–7.430)
pO2, Ven: 37 mmHg (ref 32.0–45.0)
pO2, Ven: 38 mmHg (ref 32.0–45.0)
pO2, Ven: 41 mmHg (ref 32.0–45.0)

## 2018-03-01 LAB — CBC
HEMATOCRIT: 41.3 % (ref 39.0–52.0)
Hemoglobin: 13.3 g/dL (ref 13.0–17.0)
MCH: 32 pg (ref 26.0–34.0)
MCHC: 32.2 g/dL (ref 30.0–36.0)
MCV: 99.3 fL (ref 78.0–100.0)
Platelets: 120 10*3/uL — ABNORMAL LOW (ref 150–400)
RBC: 4.16 MIL/uL — ABNORMAL LOW (ref 4.22–5.81)
RDW: 13.9 % (ref 11.5–15.5)
WBC: 6 10*3/uL (ref 4.0–10.5)

## 2018-03-01 LAB — CREATININE, SERUM
Creatinine, Ser: 1.31 mg/dL — ABNORMAL HIGH (ref 0.61–1.24)
GFR calc Af Amer: 56 mL/min — ABNORMAL LOW (ref >60)
GFR calc non Af Amer: 49 mL/min — ABNORMAL LOW (ref >60)

## 2018-03-01 SURGERY — RIGHT HEART CATH
Anesthesia: LOCAL

## 2018-03-01 MED ORDER — APIXABAN 2.5 MG PO TABS
2.5000 mg | ORAL_TABLET | Freq: Two times a day (BID) | ORAL | Status: DC
  Administered 2018-03-01 – 2018-03-03 (×4): 2.5 mg via ORAL
  Filled 2018-03-01 (×4): qty 1

## 2018-03-01 MED ORDER — MIDAZOLAM HCL 2 MG/2ML IJ SOLN
INTRAMUSCULAR | Status: AC
  Filled 2018-03-01: qty 2

## 2018-03-01 MED ORDER — MIDAZOLAM HCL 2 MG/2ML IJ SOLN
INTRAMUSCULAR | Status: DC | PRN
  Administered 2018-03-01: 0.5 mg via INTRAVENOUS

## 2018-03-01 MED ORDER — SODIUM CHLORIDE 0.9% FLUSH
3.0000 mL | Freq: Two times a day (BID) | INTRAVENOUS | Status: DC
  Administered 2018-03-01 – 2018-03-03 (×4): 3 mL via INTRAVENOUS

## 2018-03-01 MED ORDER — HEPARIN (PORCINE) IN NACL 1000-0.9 UT/500ML-% IV SOLN
INTRAVENOUS | Status: DC | PRN
  Administered 2018-03-01: 500 mL

## 2018-03-01 MED ORDER — HEPARIN (PORCINE) IN NACL 1000-0.9 UT/500ML-% IV SOLN
INTRAVENOUS | Status: AC
  Filled 2018-03-01: qty 500

## 2018-03-01 MED ORDER — ONDANSETRON HCL 4 MG/2ML IJ SOLN: 4.0000 mg | Freq: Four times a day (QID) | INTRAMUSCULAR | Status: DC | PRN

## 2018-03-01 MED ORDER — SODIUM CHLORIDE 0.9 % IV SOLN
INTRAVENOUS | Status: AC
  Administered 2018-03-01: 1 mL via INTRAVENOUS

## 2018-03-01 MED ORDER — ENOXAPARIN SODIUM 40 MG/0.4ML ~~LOC~~ SOLN: 40.0000 mg | SUBCUTANEOUS | Status: DC

## 2018-03-01 MED ORDER — SODIUM CHLORIDE 0.9% FLUSH: 3.0000 mL | INTRAVENOUS | Status: DC | PRN

## 2018-03-01 MED ORDER — BISACODYL 5 MG PO TBEC: 10.0000 mg | DELAYED_RELEASE_TABLET | Freq: Once | ORAL | Status: DC

## 2018-03-01 MED ORDER — SENNA 8.6 MG PO TABS
2.0000 | ORAL_TABLET | Freq: Every day | ORAL | Status: DC
  Administered 2018-03-01: 17.2 mg via ORAL
  Filled 2018-03-01: qty 2

## 2018-03-01 MED ORDER — LIDOCAINE HCL (PF) 1 % IJ SOLN
INTRAMUSCULAR | Status: AC
  Filled 2018-03-01: qty 30

## 2018-03-01 MED ORDER — SODIUM CHLORIDE 0.9 % IV SOLN: 250.0000 mL | INTRAVENOUS | Status: DC | PRN

## 2018-03-01 MED ORDER — LISINOPRIL 2.5 MG PO TABS
1.2500 mg | ORAL_TABLET | Freq: Every day | ORAL | Status: DC
  Administered 2018-03-01 – 2018-03-03 (×3): 1.25 mg via ORAL
  Filled 2018-03-01 (×3): qty 0.5

## 2018-03-01 MED ORDER — ENSURE ENLIVE PO LIQD
237.0000 mL | Freq: Three times a day (TID) | ORAL | Status: DC
  Administered 2018-03-01 – 2018-03-02 (×4): 237 mL via ORAL

## 2018-03-01 MED ORDER — LIDOCAINE HCL (PF) 1 % IJ SOLN
INTRAMUSCULAR | Status: DC | PRN
  Administered 2018-03-01: 2 mL

## 2018-03-01 MED ORDER — ACETAMINOPHEN 325 MG PO TABS: 650.0000 mg | ORAL_TABLET | ORAL | Status: DC | PRN

## 2018-03-01 MED ORDER — ADULT MULTIVITAMIN W/MINERALS CH
1.0000 | ORAL_TABLET | Freq: Every day | ORAL | Status: DC
  Administered 2018-03-01 – 2018-03-03 (×3): 1 via ORAL
  Filled 2018-03-01 (×3): qty 1

## 2018-03-01 SURGICAL SUPPLY — 6 items
CATH BALLN WEDGE 5F 110CM (CATHETERS) ×2 IMPLANT
PACK CARDIAC CATHETERIZATION (CUSTOM PROCEDURE TRAY) ×2 IMPLANT
SHEATH GLIDE SLENDER 4/5FR (SHEATH) ×2 IMPLANT
TRANSDUCER W/STOPCOCK (MISCELLANEOUS) ×2 IMPLANT
TUBING ART PRESS 72  MALE/FEM (TUBING) ×1
TUBING ART PRESS 72 MALE/FEM (TUBING) ×1 IMPLANT

## 2018-03-01 NOTE — Progress Notes (Signed)
To the best of my knowledge, documentation by A Ricard Dillon, [employer] nursing student is correct.

## 2018-03-01 NOTE — H&P (View-Only) (Signed)
Advanced Heart Failure Rounding Note  PCP-Cardiologist: Kirk Ruths, MD   Subjective:    Lasix held yesterday. Weight unchanged 112 lbs. Creatinine stable 1.4  Scheduled for RHC today with possible inotropes.  Feels better today. Appetite okay last night. Denies SOB or dizziness. He tells me he is no longer interested in palliative milrinone after a long discussion with his son. Still SOB with ambulation. No orthopnea or PND.   Objective:   Weight Range: 51.2 kg Body mass index is 15.75 kg/m.   Vital Signs:   Temp:  [97.4 F (36.3 C)-98.1 F (36.7 C)] 97.4 F (36.3 C) (09/24 0524) Pulse Rate:  [59-85] 62 (09/24 0524) Resp:  [18] 18 (09/24 0524) BP: (102-136)/(67-89) 136/79 (09/24 0524) SpO2:  [96 %-100 %] 100 % (09/24 0524) Weight:  [50.9 kg-51.2 kg] 51.2 kg (09/24 0524) Last BM Date: 02/23/18(Colace ordered)  Weight change: Filed Weights   02/28/18 1532 03/01/18 0524  Weight: 50.9 kg 51.2 kg    Intake/Output:   Intake/Output Summary (Last 24 hours) at 03/01/2018 0742 Last data filed at 03/01/2018 0654 Gross per 24 hour  Intake 360.75 ml  Output 150 ml  Net 210.75 ml      Physical Exam    General:  Thin. Cachectic. No resp difficulty HEENT: Normal Neck: Supple. JVP 5-6. Carotids 2+ bilat; no bruits. No lymphadenopathy or thyromegaly appreciated. Cor: PMI laterally displaced. Regular rate & rhythm. +S3 Large pectus defect  Lungs: Clear No wheeze Abdomen: Soft, nontender, nondistended. No hepatosplenomegaly. No bruits or masses. Good bowel sounds. Extremities: no cyanosis, clubbing, rash, edema. Cool.  Neuro: alert & oriented x 3, cranial nerves grossly intact. moves all 4 extremities w/o difficulty. Affect pleasant  Telemetry   A-paced 60s. Personally reviewed.   EKG    No new tracings.   Labs    CBC Recent Labs    02/28/18 1245  WBC 7.4  HGB 14.6  HCT 46.4  MCV 100.7*  PLT 992   Basic Metabolic Panel Recent Labs    02/28/18 1244  03/01/18 0546  NA 141 142  K 4.3 3.9  CL 103 106  CO2 27 29  GLUCOSE 102* 86  BUN 26* 26*  CREATININE 1.39* 1.40*  CALCIUM 9.1 8.8*   Liver Function Tests No results for input(s): AST, ALT, ALKPHOS, BILITOT, PROT, ALBUMIN in the last 72 hours. No results for input(s): LIPASE, AMYLASE in the last 72 hours. Cardiac Enzymes No results for input(s): CKTOTAL, CKMB, CKMBINDEX, TROPONINI in the last 72 hours.  BNP: BNP (last 3 results) Recent Labs    02/28/18 1246  BNP 275.3*    ProBNP (last 3 results) No results for input(s): PROBNP in the last 8760 hours.   D-Dimer No results for input(s): DDIMER in the last 72 hours. Hemoglobin A1C No results for input(s): HGBA1C in the last 72 hours. Fasting Lipid Panel No results for input(s): CHOL, HDL, LDLCALC, TRIG, CHOLHDL, LDLDIRECT in the last 72 hours. Thyroid Function Tests No results for input(s): TSH, T4TOTAL, T3FREE, THYROIDAB in the last 72 hours.  Invalid input(s): FREET3  Other results:   Imaging    Dg Chest 2 View  Result Date: 02/28/2018 CLINICAL DATA:  Heart failure EXAM: CHEST - 2 VIEW COMPARISON:  Chest radiograph 01/19/2017 FINDINGS: Lungs are hyperinflated. Numerous mediastinal surgical clips consistent with prior CABG. Left chest wall AICD leads are in unchanged position. No focal airspace consolidation or pulmonary edema. No pneumothorax or pleural effusion. Mild cardiomegaly. IMPRESSION: Mild cardiomegaly and hyperinflated  lungs without focal airspace disease. Electronically Signed   By: Ulyses Jarred M.D.   On: 02/28/2018 20:03      Medications:     Scheduled Medications: . aspirin EC  81 mg Oral Daily  . atorvastatin  20 mg Oral q1800  . cholecalciferol  1,000 Units Oral Daily  . docusate sodium  100 mg Oral BID  . feeding supplement (ENSURE ENLIVE)  237 mL Oral BID BM  . heparin  5,000 Units Subcutaneous Q8H  . vitamin B-12  1,000 mcg Oral Daily     Infusions: . sodium chloride 10 mL/hr at  03/01/18 0649     PRN Medications:  acetaminophen, ondansetron (ZOFRAN) IV    Patient Profile   MACIO KISSOON a 84 y.o.malewith PMH of CAD s/p CABG in 1996 with STEMI in 05/2012 and cath showing patent SVG-OM1-OM2 and SVG-D1 with occluded SVG-RCA and atretic LIMA-LAD with thrombectomy of proximal LAD occlusion, no significant changes on cath in 01/2017), HTN, HLD, ICM (s/p ICD placement in 2014), and chronic combined systolic and diastolic CHF.   Admitted from HF clinic with concern for low output in end stage heart failure.   Assessment/Plan   1. Chronic systolic CHF, ICM - Echo 06/26/12 LVEF 15%, Grade 1 DD, Trivial MR, Mildly reduced RV function, PA peak pressure 32 mm Hg.  - s/p St. Jude ICD  - EF dropped from 20-25% -> 15% from 01/2017 - 2019. Though by visual review it does not appear to have decreased by much.  - Volume status stable to dry. Lasix on hold. - Dig held with dig level 1.9 yesterday. - No BB with concern for low output.  - Continue Lisinopril 1.25 mg daily. SBP 130s. - He is no longer interested in home inotropes. I will discuss with Dr Haroldine Laws whether or not to cancel RHC. We discussed hospice, but he is indecisive about this. I was unable to get a hold of his son. - Palliative care consulted. - He is not a candidate for VAD or transplant. 2. CAD s/p CABG - Most recent cath 01/2017 with patent SVG to diagonal, patent SVG to OM 2, and OM 3. Patent proximal LAD stent. LIMA to LAD and SVG to RCA known occluded.  - Continue ASA, statin. BB stopped with concern for low output.  - No s/s ischemia. 3. PAF - On ICD interrogation - afib for 6 hours on 9/13. Plan to start heparin today after cath if he has cath. 4. HLD - Continue statin 5. Disposition - No longer interested in inotropes. Palliative consulted. We discussed possible hospice, but pt avoided any kind of decision regarding this. PT consulted for ?SNF placement. - He is a limited code. He would  like intubation, but nothing else.    Will discuss whether or not pt should have [place] today with Dr Haroldine Laws. I tried calling his son, [name], but did not get an answer this morning. Pt seems very indecisive.   Length of Stay: [place], NP  03/01/2018, 7:42 AM  Advanced Heart Failure Team Pager [phone] (M-F; 7a - 4p)  Please contact [facility] for night-coverage after hours (4p -7a ) and weekends on [email or web address]  Patient seen and examined with the above-signed Advanced Practice Provider and/or Housestaff. I personally reviewed laboratory data, imaging studies and relevant notes. I independently examined the patient and formulated the important aspects of the plan. I have edited the note to reflect any of my changes or salient points. I have  personally discussed the plan with the patient and/or family.  Extensive discussions had with him and his son about his situation yesterday. He severe ischemic CM and now with end-stage HF. With age and pectus deformity, he is not candidate for LVAD or transplant. Discussed Hospice vs palliative inotropes. Yesterday he stated he would like SNF placement with trial of inotropes. This am he says he does not think he want to proceed with home inotropes. We discussed this again and have decided to proceed with RHC to evaluate the severity of his resting hemodynamics. Will consult Palliative Care team to help with [place] discussion. Labs ok. Digoxin 1.9 so it was held.   Glori Bickers, MD  9:17 AM

## 2018-03-01 NOTE — Interval H&P Note (Signed)
History and Physical Interval Note:  03/01/2018 9:46 AM  [name]  has presented today for surgery, with the diagnosis of CHF  The various methods of treatment have been discussed with the patient and family. After consideration of risks, benefits and other options for treatment, the patient has consented to  Procedure(s): RIGHT HEART CATH (N/A) as a surgical intervention .  The patient's history has been reviewed, patient examined, no change in status, stable for surgery.  I have reviewed the patient's chart and labs.  Questions were answered to the patient's satisfaction.     Louis Reid

## 2018-03-01 NOTE — Progress Notes (Signed)
PT Cancellation Note  Patient Details Name: Louis Reid MRN: [id number] DOB: [birth year]   Cancelled Treatment:    Reason Eval/Treat Not Completed: Patient at procedure or test/unavailable. Pt off floor at cath lab. PT to return as able.  Kittie Plater, PT, DPT Acute Rehabilitation Services Pager #: [phone] Office #: [phone]    Berline Lopes 03/01/2018, 9:37 AM

## 2018-03-01 NOTE — Progress Notes (Signed)
Initial Nutrition Assessment  DOCUMENTATION CODES:   Underweight  INTERVENTION:   -MVI with minerals daily -Increase Ensure Enlive po to TID, each supplement provides 350 kcal and 20 grams of protein  NUTRITION DIAGNOSIS:   Increased nutrient needs related to chronic illness(CHF) as evidenced by estimated needs.  GOAL:   Patient will meet greater than or equal to 90% of their needs  MONITOR:   PO intake, Supplement acceptance, Labs, Weight trends, Skin, I & O's  REASON FOR ASSESSMENT:   Malnutrition Screening Tool    ASSESSMENT:   Louis Reid is a 84 y.o. male with PMH of CAD s/p CABG in 1996 with STEMI in 05/2012 and cath showing patent SVG-OM1-OM2 and SVG-D1 with occluded SVG-RCA and atretic LIMA-LAD with thrombectomy of proximal LAD occlusion, no significant changes on cath in 01/2017), HTN, HLD, ICM (s/p ICD placement in 2014), and chronic combined systolic and diastolic CHF.  Admitted for RHC and possible initiation of inotropes.   Pt admitted with heart failure; rt heart cath and possible initiation of inotropes.   9/24- s/p rt heart cath  Pt and family members meeting with palliative care at time of visit. Unable to perform nutrition-focused physical exam or obtain further nutrition history at this time.   Reviewed wt hx; pt has experienced a 15.8% wt loss over the past 3 months, which is significant for time frame.   Pt consumed 85% of lunch meal and is consuming Ensure supplements.   RD suspects malnutrition, however, unable to identify at this time.   Labs reviewed.   NUTRITION - FOCUSED PHYSICAL EXAM:    Most Recent Value  Orbital Region  Unable to assess  Upper Arm Region  Unable to assess  Thoracic and Lumbar Region  Unable to assess  Buccal Region  Unable to assess  Temple Region  Unable to assess  Clavicle Bone Region  Unable to assess  Clavicle and Acromion Bone Region  Unable to assess  Scapular Bone Region  Unable to assess  Dorsal Hand   Unable to assess  Patellar Region  Unable to assess  Anterior Thigh Region  Unable to assess  Posterior Calf Region  Unable to assess  Edema (RD Assessment)  Unable to assess  Hair  Unable to assess  Eyes  Unable to assess  Mouth  Unable to assess  Skin  Unable to assess  Nails  Unable to assess       Diet Order:   Diet Order            Diet Heart Room service appropriate? Yes; Fluid consistency: Thin  Diet effective now              EDUCATION NEEDS:   No education needs have been identified at this time  Skin:  Skin Assessment: Reviewed RN Assessment  Last BM:  03/01/18  Height:   Ht Readings from Last 1 Encounters:  02/28/18 5\' 11"  (1.803 m)    Weight:   Wt Readings from Last 1 Encounters:  03/01/18 51.2 kg    Ideal Body Weight:  78.2 kg  BMI:  Body mass index is 15.75 kg/m.  Estimated Nutritional Needs:   Kcal:  1400-1600  Protein:  60-75 grams  Fluid:  1.4-1.6 L    Wandell Scullion A. Jimmye Norman, RD, LDN, CDE Pager: [phone] After hours Pager: [phone]

## 2018-03-01 NOTE — Progress Notes (Addendum)
Advanced Heart Failure Rounding Note  PCP-Cardiologist: Louis Ruths, MD   Subjective:    Lasix held yesterday. Weight unchanged 112 lbs. Creatinine stable 1.4  Scheduled for RHC today with possible inotropes.  Feels better today. Appetite okay last night. Denies SOB or dizziness. He tells me he is no longer interested in palliative milrinone after a long discussion with his son. Still SOB with ambulation. No orthopnea or PND.   Objective:   Weight Range: 51.2 kg Body mass index is 15.75 kg/m.   Vital Signs:   Temp:  [97.4 F (36.3 C)-98.1 F (36.7 C)] 97.4 F (36.3 C) (09/24 0524) Pulse Rate:  [59-85] 62 (09/24 0524) Resp:  [18] 18 (09/24 0524) BP: (102-136)/(67-89) 136/79 (09/24 0524) SpO2:  [96 %-100 %] 100 % (09/24 0524) Weight:  [50.9 kg-51.2 kg] 51.2 kg (09/24 0524) Last BM Date: 02/23/18(Colace ordered)  Weight change: Filed Weights   02/28/18 1532 03/01/18 0524  Weight: 50.9 kg 51.2 kg    Intake/Output:   Intake/Output Summary (Last 24 hours) at 03/01/2018 0742 Last data filed at 03/01/2018 0654 Gross per 24 hour  Intake 360.75 ml  Output 150 ml  Net 210.75 ml      Physical Exam    General:  Thin. Cachectic. No resp difficulty HEENT: Normal Neck: Supple. JVP 5-6. Carotids 2+ bilat; no bruits. No lymphadenopathy or thyromegaly appreciated. Cor: PMI laterally displaced. Regular rate & rhythm. +S3 Large pectus defect  Lungs: Clear No wheeze Abdomen: Soft, nontender, nondistended. No hepatosplenomegaly. No bruits or masses. Good bowel sounds. Extremities: no cyanosis, clubbing, rash, edema. Cool.  Neuro: alert & oriented x 3, cranial nerves grossly intact. moves all 4 extremities w/o difficulty. Affect pleasant  Telemetry   A-paced 60s. Personally reviewed.   EKG    No new tracings.   Labs    CBC Recent Labs    02/28/18 1245  WBC 7.4  HGB 14.6  HCT 46.4  MCV 100.7*  PLT 546   Basic Metabolic Panel Recent Labs    02/28/18 1244  03/01/18 0546  NA 141 142  K 4.3 3.9  CL 103 106  CO2 27 29  GLUCOSE 102* 86  BUN 26* 26*  CREATININE 1.39* 1.40*  CALCIUM 9.1 8.8*   Liver Function Tests No results for input(s): AST, ALT, ALKPHOS, BILITOT, PROT, ALBUMIN in the last 72 hours. No results for input(s): LIPASE, AMYLASE in the last 72 hours. Cardiac Enzymes No results for input(s): CKTOTAL, CKMB, CKMBINDEX, TROPONINI in the last 72 hours.  BNP: BNP (last 3 results) Recent Labs    02/28/18 1246  BNP 275.3*    ProBNP (last 3 results) No results for input(s): PROBNP in the last 8760 hours.   D-Dimer No results for input(s): DDIMER in the last 72 hours. Hemoglobin A1C No results for input(s): HGBA1C in the last 72 hours. Fasting Lipid Panel No results for input(s): CHOL, HDL, LDLCALC, TRIG, CHOLHDL, LDLDIRECT in the last 72 hours. Thyroid Function Tests No results for input(s): TSH, T4TOTAL, T3FREE, THYROIDAB in the last 72 hours.  Invalid input(s): FREET3  Other results:   Imaging    Dg Chest 2 View  Result Date: 02/28/2018 CLINICAL DATA:  Heart failure EXAM: CHEST - 2 VIEW COMPARISON:  Chest radiograph 01/19/2017 FINDINGS: Lungs are hyperinflated. Numerous mediastinal surgical clips consistent with prior CABG. Left chest wall AICD leads are in unchanged position. No focal airspace consolidation or pulmonary edema. No pneumothorax or pleural effusion. Mild cardiomegaly. IMPRESSION: Mild cardiomegaly and hyperinflated  lungs without focal airspace disease. Electronically Signed   By: Louis Reid M.D.   On: 02/28/2018 20:03      Medications:     Scheduled Medications: . aspirin EC  81 mg Oral Daily  . atorvastatin  20 mg Oral q1800  . cholecalciferol  1,000 Units Oral Daily  . docusate sodium  100 mg Oral BID  . feeding supplement (ENSURE ENLIVE)  237 mL Oral BID BM  . heparin  5,000 Units Subcutaneous Q8H  . vitamin B-12  1,000 mcg Oral Daily     Infusions: . sodium chloride 10 mL/hr at  03/01/18 0649     PRN Medications:  acetaminophen, ondansetron (ZOFRAN) IV    Patient Profile   Louis Reid a 84 y.o.malewith PMH of CAD s/p CABG in 1996 with STEMI in 05/2012 and cath showing patent SVG-OM1-OM2 and SVG-D1 with occluded SVG-RCA and atretic LIMA-LAD with thrombectomy of proximal LAD occlusion, no significant changes on cath in 01/2017), HTN, HLD, ICM (s/p ICD placement in 2014), and chronic combined systolic and diastolic CHF.   Admitted from HF clinic with concern for low output in end stage heart failure.   Assessment/Plan   1. Chronic systolic CHF, ICM - Echo 2/83/15 LVEF 15%, Grade 1 DD, Trivial MR, Mildly reduced RV function, PA peak pressure 32 mm Hg.  - s/p St. Jude ICD  - EF dropped from 20-25% -> 15% from 01/2017 - 2019. Though by visual review it does not appear to have decreased by much.  - Volume status stable to dry. Lasix on hold. - Dig held with dig level 1.9 yesterday. - No BB with concern for low output.  - Continue Lisinopril 1.25 mg daily. SBP 130s. - He is no longer interested in home inotropes. I will discuss with Dr Louis Reid whether or not to cancel RHC. We discussed hospice, but he is indecisive about this. I was unable to get a hold of his son. - Palliative care consulted. - He is not a candidate for VAD or transplant. 2. CAD s/p CABG - Most recent cath 01/2017 with patent SVG to diagonal, patent SVG to OM 2, and OM 3. Patent proximal LAD stent. LIMA to LAD and SVG to RCA known occluded.  - Continue ASA, statin. BB stopped with concern for low output.  - No s/s ischemia. 3. PAF - On ICD interrogation - afib for 6 hours on 9/13. Plan to start heparin today after cath if he has cath. 4. HLD - Continue statin 5. Disposition - No longer interested in inotropes. Palliative consulted. We discussed possible hospice, but pt avoided any kind of decision regarding this. PT consulted for ?SNF placement. - He is a limited code. He would  like intubation, but nothing else.    Will discuss whether or not pt should have [place] today with Dr Louis Reid. I tried calling his son, Louis Reid, but did not get an answer this morning. Pt seems very indecisive.   Length of Stay: [place], NP  03/01/2018, 7:42 AM  Advanced Heart Failure Team Pager [phone] (M-F; 7a - 4p)  Please contact [facility] for night-coverage after hours (4p -7a ) and weekends on [email or web address]  Patient seen and examined with the above-signed Advanced Practice Provider and/or Housestaff. I personally reviewed laboratory data, imaging studies and relevant notes. I independently examined the patient and formulated the important aspects of the plan. I have edited the note to reflect any of my changes or salient points. I have  personally discussed the plan with the patient and/or family.  Extensive discussions had with him and his son about his situation yesterday. He severe ischemic CM and now with end-stage HF. With age and pectus deformity, he is not candidate for LVAD or transplant. Discussed Hospice vs palliative inotropes. Yesterday he stated he would like SNF placement with trial of inotropes. This am he says he does not think he want to proceed with home inotropes. We discussed this again and have decided to proceed with RHC to evaluate the severity of his resting hemodynamics. Will consult Palliative Care team to help with [place] discussion. Labs ok. Digoxin 1.9 so it was held.   Glori Bickers, MD  9:17 AM

## 2018-03-01 NOTE — Progress Notes (Signed)
Patient gong down for cardiac cath.  Patients son says he may need something for depression.

## 2018-03-01 NOTE — Evaluation (Signed)
Physical Therapy Evaluation Patient Details Name: Louis Reid MRN: [id number] DOB: 1932/12/16 Today's Date: 03/01/2018   History of Present Illness  Louis Reid is a 84 y.o. male with PMH of CAD s/p CABG in 1996 with STEMI in 05/2012, HTN, HLD, ICM (s/p ICD placement in 2014), and chronic combined systolic and diastolic CHF. Admitted for inability to amb >10' and 11pound weight loss in 4 weeks.  Clinical Impression  Pt functioning at mod I/supervision level. Pt with decreased activity tolerance, Increased falls risk requiring use of RW for safe ambulation, and reports he doesn't feel comfortable going home alone. Per chart and pt, medically plan still being decided. Aware palliative medicine consult placed and patient is possible considering hospice care. PT to cont to follow while in hospital.   Follow Up Recommendations Home health PT;Supervision/Assistance - 24 hour(however aware they are considering hospice)    Equipment Recommendations  Rolling walker with 5" wheels    Recommendations for Other Services       Precautions / Restrictions Precautions Precautions: Fall Precaution Comments: ICD Restrictions Weight Bearing Restrictions: No      Mobility  Bed Mobility Overal bed mobility: Modified Independent             General bed mobility comments: HOB elevated, no difficulty  Transfers Overall transfer level: Modified independent Equipment used: None             General transfer comment: pt powered up pushing up from bed, no difficulty  Ambulation/Gait Ambulation/Gait assistance: Min guard Gait Distance (Feet): 100 Feet Assistive device: Rolling walker (2 wheeled) Gait Pattern/deviations: Step-through pattern;Decreased stride length;Trunk flexed Gait velocity: slow Gait velocity interpretation: <1.8 ft/sec, indicate of risk for recurrent falls General Gait Details: pt with report "no my legs will give out on me" Pt reports feeling more steady with RW  compared to the cane  he was using at home. pt with no instability or fall when amb with RW  Stairs            Wheelchair Mobility    Modified Rankin (Stroke Patients Only)       Balance Overall balance assessment: Mild deficits observed, not formally tested                                           Pertinent Vitals/Pain Pain Assessment: No/denies pain    Home Living Family/patient expects to be discharged to:: Unsure Living Arrangements: Alone               Additional Comments: pt lives alone in 1 story home with 5STE however palliative being consulted and patient doesnt want to return home. Trying to decide if he wants to go home with hospice or to a SNF    Prior Function Level of Independence: Independent         Comments: was independent and didnt use AD until last couple of weeks when he was experiencing weakness     Hand Dominance   Dominant Hand: Right    Extremity/Trunk Assessment   Upper Extremity Assessment Upper Extremity Assessment: Generalized weakness    Lower Extremity Assessment Lower Extremity Assessment: Generalized weakness    Cervical / Trunk Assessment Cervical / Trunk Assessment: Kyphotic  Communication   Communication: No difficulties  Cognition Arousal/Alertness: Awake/alert Behavior During Therapy: Anxious(pt reports "i'm very organized") Overall Cognitive Status: Within Functional Limits for tasks assessed  General Comments General comments (skin integrity, edema, etc.): pt with non-healing wound on L shin. Pt states he hit on something and has a hard time healing    Exercises     Assessment/Plan    PT Assessment Patient needs continued PT services  PT Problem List Decreased strength;Decreased activity tolerance;Decreased balance;Decreased mobility;Decreased coordination;Decreased knowledge of use of DME       PT Treatment Interventions  DME instruction;Gait training;Stair training;Functional mobility training;Therapeutic activities;Therapeutic exercise;Balance training;Neuromuscular re-education    PT Goals (Current goals can be found in the Care Plan section)  Acute Rehab PT Goals Patient Stated Goal: "trying to figure out the next step" PT Goal Formulation: With patient Time For Goal Achievement: 03/15/18 Potential to Achieve Goals: Good Additional Goals Additional Goal #1: Pt to score >19 on DGI to indicate minimal falls risk.    Frequency Min 3X/week   Barriers to discharge Decreased caregiver support lives home alone    Co-evaluation               AM-PAC PT "6 Clicks" Daily Activity  Outcome Measure Difficulty turning over in bed (including adjusting bedclothes, sheets and blankets)?: None Difficulty moving from lying on back to sitting on the side of the bed? : None Difficulty sitting down on and standing up from a chair with arms (e.g., wheelchair, bedside commode, etc,.)?: None Help needed moving to and from a bed to chair (including a wheelchair)?: None Help needed walking in hospital room?: None Help needed climbing 3-5 steps with a railing? : A Little 6 Click Score: 23    End of Session Equipment Utilized During Treatment: Gait belt Activity Tolerance: Patient tolerated treatment well Patient left: in bed;with call bell/phone within reach;with bed alarm set(sitting on EOB waiting for lunch) Nurse Communication: Mobility status PT Visit Diagnosis: Unsteadiness on feet (R26.81)    Time: 1250-1305 PT Time Calculation (min) (ACUTE ONLY): 15 min   Charges:   PT Evaluation $PT Eval Moderate Complexity: 1 Mod          Kittie Plater, PT, DPT Acute Rehabilitation Services Pager #: [phone] Office #: [phone]   Berline Lopes 03/01/2018, 1:50 PM

## 2018-03-01 NOTE — Plan of Care (Signed)
  Problem: Education: Goal: Knowledge of General Education information will improve Description Including pain rating scale, medication(s)/side effects and non-pharmacologic comfort measures Outcome: Progressing   Problem: Health Behavior/Discharge Planning: Goal: Ability to manage health-related needs will improve Outcome: Progressing   

## 2018-03-01 NOTE — Consult Note (Signed)
Consultation Note Date: 03/01/2018   Patient Name: Louis Reid  DOB: [birth year]  MRN: [id number]  Age / Sex: 84 y.o., male  PCP: [name], MD Referring Physician: Jolaine Artist, MD  Reason for Consultation: Establishing goals of care and Psychosocial/spiritual support  HPI/Patient Profile: 84 y.o. male  with past medical history of CAD, mixed CHF with EF of 15%, PE, and pectus excavatum who was admitted on 02/28/2018 with complaints of weight loss and severe fatigue with exertion.  He underwent right heart catheterization on 9/24.  His symptoms are attributed to heart failure exacerbation.  Clinical Assessment and Goals of Care:  I have reviewed medical records including EPIC notes, labs and imaging, received report from the care team, assessed the patient and then met at the bedside along with his son Louis Reid,  to discuss diagnosis prognosis, GOC, EOL wishes, disposition and options.  I introduced Palliative Medicine as specialized medical care for people living with serious illness. It focuses on providing relief from the symptoms and stress of a serious illness. The goal is to improve quality of life for both the patient and the family.  We discussed a brief life review of the patient. 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  He is also a self described prankster and has a wonderful sense of humor.  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  He particularly enjoys Advice worker.  He has a strong International aid/development worker and is the father of 1 son Louis Reid.  As far as functional and nutritional status, [name] lives alone and is independent with ADLs.  Most recently he would become to exhausted to go on when walking 10 feet.   He has no appetite any longer and lost 11 lbs in the last month.   He complains of a "funny feeling in his  stomach" (Nausea?), leg weakness, and decreased appetite.  Louis Reid expresses concern for meal prep at home as well as his father's safety.  Louis Reid is worried his father will fall in the house alone.  As evidence of his detail orientated personality, [name] pulls out a very neat chart with each of his prescribed medications on it.  He explains that each medication has know side effects that match his symptoms (weakness, nausea, decreased appetite).  He received word that his heart cath results were better than expected.  Consequently he is wondering if his heart is the problem or is it the medication? We discussed the balancing act that comes with advanced disease.  The other primary concern [name] raised was about his long term care policy with [place] [employer].  The policy has a 90 day waiting period before it will pay claims.  What "starts the clock on the 90 day waiting period?"  I offered to ask Case Management for help with this question.    We discussed his current illness and what it means in the larger context of his on-going co-morbidities.  Natural disease trajectory and expectations at EOL were discussed.  [name] understands he has progressive heart disease.  I explained that he looks much better than his medical chart would indicate.  Further, other patients that I have seen with his level of disease would not be here in 6 months.  [name] was not surprised and agreed with me.  However, he is feeling much better than he did on admission and is hopeful for further improvement.  We discussed his values and when he would want to avoid aggressive medical intervention.  When I just feel bad all the time with no improvement or  I'm not eating at all - or if I'm in significant pain -  I would not want aggressive medical intervention.  [name] talked about the "Rocket Thrust" (Milrinone).  I asked why he did not want it.  He replied that he did not want to live with an IV in his arm hooked up to a poll - that just  didn't seem right.    The difference between aggressive medical intervention and comfort care was considered in light of the patient's goals of care.  [name] is not ready for "comfort measures".  His goal at this point is to improve.  He and Louis Reid are thinking of physical therapy and getting stronger.  Hospice and Palliative Care services outpatient were explained and offered.  At this point [name] and Louis Reid are leaning away from [facility] and considering skilled rehab.  Questions and concerns were addressed.   The family was encouraged to call with questions or concerns.    Primary Decision Maker:  PATIENT. Heath Care Surrogate Decision maker is his son Louis Reid.    SUMMARY OF RECOMMENDATIONS    1.  Case management referral to help with questions about [facility] care policy.  Answers to questions about the policy will impact [name]'s decisions regarding discharge and disposition.  Will review MOST form.  2.  Palliative Medicine will follow up on 9/25 to further discuss disposition and address code status.  3.  PMT will defer questions regarding BP and cardiac medications vs symptoms to the primary team.  4.  Senakot added for constipation.   Code Status/Advance Care Planning:  Partial   Psycho-social/Spiritual:   Desire for further Chaplaincy support:  Not at this time.  Prognosis: difficult to determine however given his recent rapid decline, advanced heart failure (EF 15%) weight loss, and decreased mobility, a life expectancy of less than 6 months is reasonable.    Discharge Planning: To Be Determined.  Anticipate SNF with Palliative.      Primary Diagnoses: Present on Admission: **None**   I have reviewed the medical record, interviewed the patient and family, and examined the patient. The following aspects are pertinent.  Past Medical History:  Diagnosis Date  . AICD (automatic cardioverter/defibrillator) present 09/13/2012  . Anemia   . CAD (coronary artery  disease)    a. s/p remote CABG, b. AL STEMI c/b VF arrest in 05/2012 => LHC 05/10/12: Severe 3v CAD, S-OM1/2 ok, SVG-Dx ok, SVG-RCA occluded, LIMA-LAD atretic, proximal LAD occluded. EF 20% with anterior apical HK=> salvage PCI with POBA and thrombectomy of the occluded LAD;  c. 01/2017: atretic LIMA_LAD and known occluded SVG-RCA. Patent LAD stent and 70% proxRCA stenosis (FFR 0.88).   . Chronic systolic heart failure ([place])    a. Echo 12/13: EF 20-25%, anteroseptal, inferior, apical HK, mildly reduced RV function, trivial pericardial effusion;   b.  Echo 3/14:  Ant, septal,  apical AK, EF 25%  . Clotting disorder ([place])   . Depression    "years ago" (02/28/2018)  . History of kidney stones   . HLD (hyperlipidemia)   . Hypertension    "I've never had high blood pressure; on RX for other reasons" (02/28/2018)  . Inguinal hernia    "have one now on my left side" (09/13/2012); (02/28/2018)  . Ischemic cardiomyopathy   . Myocardial infarction [facility]) 1996;  05/2012  . Osteoporosis   . Paroxysmal atrial fibrillation ([place]) 03/17/2016   noted on device interrogation,  will follow burden  . Pectus excavatum   . Pulmonary embolism ([place])    a. after adhesiolysis for SBO in 04/2012 => coumadin for 6 mos  . Renal cyst   . SBO (small bowel obstruction) [facility]) November 2013   s/p lysis of adhesions  . Sinoatrial node dysfunction (H[facility])   . Skin cancer    "left ear; face" (02/28/2018)  . Ventricular fibrillation ([place]) 05/2012   . in setting of AL STEMI 12/13 => EF 20-25% => d/c on LifeVest (repeat echo planned in 08/2012)   Social History   Socioeconomic History  . Marital status: Widowed    Spouse name: Not on file  . Number of children: 1  . Years of education: Not on file  . Highest education level: Not on file  Occupational History  . Occupation: [name]    Employer: RETIRED  Social Needs  . Financial resource strain: Not on file  . Food insecurity:    Worry: Not on file     Inability: Not on file  . Transportation needs:    Medical: Not on file    Non-medical: Not on file  Tobacco Use  . Smoking status: Former Smoker    Types: Cigarettes  . Smokeless tobacco: Never Used  . Tobacco comment: 09/14/2011; 02/28/2018  "quit smoking when I was ~ 17; probably didn't smoke 10 packs in my life"  Substance and Sexual Activity  . Alcohol use: Never    Frequency: Never  . Drug use: Never  . Sexual activity: Not Currently  Lifestyle  . Physical activity:    Days per week: Not on file    Minutes per session: Not on file  . Stress: Not on file  Relationships  . Social connections:    Talks on phone: Not on file    Gets together: Not on file    Attends religious service: Not on file    Active member of club or organization: Not on file    Attends meetings of clubs or organizations: Not on file    Relationship status: Not on file  Other Topics Concern  . Not on file  Social History Narrative  . Not on file   Family History  Problem Relation Age of Onset  . Heart disease Father   . Other Mother        brain tumor  . Colon cancer Brother   . Cancer Brother        Liver  . Heart disease Brother    Scheduled Meds: . apixaban  2.5 mg Oral BID  . aspirin EC  81 mg Oral Daily  . atorvastatin  20 mg Oral q1800  . cholecalciferol  1,000 Units Oral Daily  . docusate sodium  100 mg Oral BID  . feeding supplement (ENSURE ENLIVE)  237 mL Oral BID BM  . lisinopril  1.25 mg Oral Daily  . senna  2 tablet Oral  QHS  . sodium chloride flush  3 mL Intravenous Q12H  . vitamin B-12  1,000 mcg Oral Daily   Continuous Infusions: . sodium chloride     PRN Meds:.sodium chloride, acetaminophen, ondansetron (ZOFRAN) IV, sodium chloride flush Allergies  Allergen Reactions  . Cold Medicine Plus [Chlorphen-Pseudoephed-Apap]     Reaction a long time ago - made him feel goofy   Review of Systems Fatigue, weakness, nausea, weight loss, constipation  Physical Exam  Thin well  developed male A&O, coherent, cooperative CV rrr no frank m/r/g Resp CTA no w/c/r Abdomen thin, nt, nd, +bs Lower extremities without edema  Vital Signs: BP 119/71   Pulse 68   Temp 98 F (36.7 C) (Oral)   Resp 20   Ht '5\' 11"'$  (1.803 m)   Wt 51.2 kg Comment: scale a  SpO2 100%   BMI 15.75 kg/m  Pain Scale: 0-10   Pain Score: 0-No pain   SpO2: SpO2: 100 % O2 Device:SpO2: 100 % O2 Flow Rate: .   IO: Intake/output summary:   Intake/Output Summary (Last 24 hours) at 03/01/2018 1648 Last data filed at 03/01/2018 1506 Gross per 24 hour  Intake 1389.96 ml  Output 525 ml  Net 864.96 ml    LBM: Last BM Date: ("a week ago") Baseline Weight: Weight: 50.9 kg Most recent weight: Weight: 51.2 kg(scale a)     Palliative Assessment/Data: 50%     Time In: 3:00 Time Out: 4:30 Time Total: 90 min. Greater than 50%  of this time was spent counseling and coordinating care related to the above assessment and plan.  Signed by: Florentina Jenny, [name] Palliative Medicine Pager: [phone]  Please contact Palliative Medicine Team phone at [phone] for questions and concerns.  For individual provider: See Shea Evans

## 2018-03-02 DIAGNOSIS — I5023 Acute on chronic systolic (congestive) heart failure: Secondary | ICD-10-CM | POA: Diagnosis not present

## 2018-03-02 DIAGNOSIS — Z515 Encounter for palliative care: Secondary | ICD-10-CM

## 2018-03-02 DIAGNOSIS — Z7189 Other specified counseling: Secondary | ICD-10-CM

## 2018-03-02 DIAGNOSIS — I110 Hypertensive heart disease with heart failure: Secondary | ICD-10-CM | POA: Diagnosis not present

## 2018-03-02 DIAGNOSIS — I5042 Chronic combined systolic (congestive) and diastolic (congestive) heart failure: Secondary | ICD-10-CM | POA: Diagnosis not present

## 2018-03-02 DIAGNOSIS — I5040 Unspecified combined systolic (congestive) and diastolic (congestive) heart failure: Secondary | ICD-10-CM | POA: Diagnosis not present

## 2018-03-02 LAB — BASIC METABOLIC PANEL
Anion gap: 5 (ref 5–15)
BUN: 30 mg/dL — AB (ref 8–23)
CO2: 30 mmol/L (ref 22–32)
CREATININE: 1.27 mg/dL — AB (ref 0.61–1.24)
Calcium: 8.7 mg/dL — ABNORMAL LOW (ref 8.9–10.3)
Chloride: 107 mmol/L (ref 98–111)
GFR, EST AFRICAN AMERICAN: 59 mL/min — AB (ref >60)
GFR, EST NON AFRICAN AMERICAN: 50 mL/min — AB (ref >60)
Glucose, Bld: 103 mg/dL — ABNORMAL HIGH (ref 70–99)
Potassium: 4 mmol/L (ref 3.5–5.1)
SODIUM: 142 mmol/L (ref 135–145)

## 2018-03-02 LAB — DIGOXIN LEVEL: DIGOXIN LVL: 1.2 ng/mL (ref 0.8–2.0)

## 2018-03-02 NOTE — Progress Notes (Signed)
Nutrition Follow-up  DOCUMENTATION CODES:   Severe malnutrition in context of chronic illness, Underweight  INTERVENTION:   -Continue Ensure Enlive po TID, each supplement provides 350 kcal and 20 grams of protein -Continue MVI with minerals daily   NUTRITION DIAGNOSIS:   Severe Malnutrition related to chronic illness(CHF) as evidenced by percent weight loss, moderate muscle depletion, severe muscle depletion, moderate fat depletion, severe fat depletion, energy intake < 75% for > or equal to 1 month.  Ongoing  GOAL:   Patient will meet greater than or equal to 90% of their needs  Progressing  MONITOR:   PO intake, Supplement acceptance, Labs, Weight trends, Skin, I & O's  REASON FOR ASSESSMENT:   Malnutrition Screening Tool    ASSESSMENT:   Louis Reid is a 84 y.o. male with PMH of CAD s/p CABG in 1996 with STEMI in 05/2012 and cath showing patent SVG-OM1-OM2 and SVG-D1 with occluded SVG-RCA and atretic LIMA-LAD with thrombectomy of proximal LAD occlusion, no significant changes on cath in 01/2017), HTN, HLD, ICM (s/p ICD placement in 2014), and chronic combined systolic and diastolic CHF.  Admitted for RHC and possible initiation of inotropes.   Spoke with pt and son at bedside. Pt reports a decreased appetite over the past 3 months related to early satiety, heart failure, and taste changes. Pt reports he became very concerned over the past week, because his desire to eat sweets (that he usually loves) also decreased. Pt reports he has been "forcing myself to eat something" over the past week; pt son reports minimal intake. Since hospitalization, intake has improved. Pt reports consuming 75% of breakfast. Pt really enjoys Ensure supplements and has been consuming them.   Pt endorses significant wt loss over the past month; he estimate has last approximately 15-20#. Reviewed wt hx; pt has experienced a 15.8% wt loss over the past 3 months, which is significant for time  frame.   Pt reports he is considering discharge home with home health services and possible transitioning to rehab. He has met with Palliative Care and is motivated to get better. Discussed importance of good meal and supplement intake to promote healing; pt reports he will continue Ensure supplements at home.   Labs reviewed.   NUTRITION - FOCUSED PHYSICAL EXAM:    Most Recent Value  Orbital Region  Severe depletion  Upper Arm Region  Severe depletion  Thoracic and Lumbar Region  Moderate depletion  Buccal Region  Moderate depletion  Temple Region  Severe depletion  Clavicle Bone Region  Severe depletion  Clavicle and Acromion Bone Region  Severe depletion  Scapular Bone Region  Severe depletion  Dorsal Hand  Moderate depletion  Patellar Region  Severe depletion  Anterior Thigh Region  Severe depletion  Posterior Calf Region  Severe depletion  Edema (RD Assessment)  None  Hair  Reviewed  Eyes  Reviewed  Mouth  Reviewed  Skin  Reviewed  Nails  Reviewed       Diet Order:   Diet Order            Diet Heart Room service appropriate? Yes; Fluid consistency: Thin  Diet effective now              EDUCATION NEEDS:   Education needs have been addressed  Skin:  Skin Assessment: Reviewed RN Assessment  Last BM:  03/01/18  Height:   Ht Readings from Last 1 Encounters:  02/28/18 '5\' 11"'$  (1.803 m)    Weight:   Wt Readings from  Last 1 Encounters:  03/02/18 52.1 kg    Ideal Body Weight:  78.2 kg  BMI:  Body mass index is 16.01 kg/m.  Estimated Nutritional Needs:   Kcal:  1400-1600  Protein:  60-75 grams  Fluid:  1.4-1.6 L    Hser Belanger A. Jimmye Norman, RD, LDN, CDE Pager: [phone] After hours Pager: [phone]

## 2018-03-02 NOTE — Progress Notes (Addendum)
Daily Progress Note   Patient Name: Louis Reid       Date: 03/02/2018 DOB: Dec 01, 1932  Age: 84 y.o. MRN#: [id number] Attending Physician: Georgiana Shore, NP Primary Care Physician: [name], MD Admit Date: 02/28/2018  Reason for Consultation/Follow-up: Establishing goals of care and Psychosocial/spiritual support  Subjective: Met with patient at bedside.  He feels much better.  He had a bowel movement last night which helped as well.  Case Manager [employer] joined [place] as well.  After some discussion, Louis Reid decided going home with hospice would be the best option for him.  Louis Reid and I then called [employer] on speaker phone to find out the details of his policy and how to activate it. Benefits can start immediately for home health private care in addition to Hospice services.  Louis Reid seemed very relieved and grateful afterward.   Assessment: Patient with advanced right heart failure being medically optimized before discharged to home with hospice services.   Patient Profile/HPI: 84 y.o. male  with past medical history of CAD, mixed CHF with EF of 15%, PE, and pectus excavatum who was admitted on 02/28/2018 with complaints of weight loss and severe fatigue with exertion.  He underwent right heart catheterization on 9/24.  His symptoms are attributed to heart failure exacerbation.     Length of Stay: 2  Current Medications: Scheduled Meds:  . apixaban  2.5 mg Oral BID  . aspirin EC  81 mg Oral Daily  . atorvastatin  20 mg Oral q1800  . cholecalciferol  1,000 Units Oral Daily  . docusate sodium  100 mg Oral BID  . feeding supplement (ENSURE ENLIVE)  237 mL Oral TID BM  . lisinopril  1.25 mg Oral Daily  . multivitamin with minerals  1 tablet Oral Daily  . senna  2 tablet  Oral QHS  . sodium chloride flush  3 mL Intravenous Q12H  . vitamin B-12  1,000 mcg Oral Daily    Continuous Infusions: . sodium chloride      PRN Meds: sodium chloride, acetaminophen, ondansetron (ZOFRAN) IV, sodium chloride flush  Physical Exam        Thin frail elderly man.  Awake, alert, coherent, pleasant, sitting up in bed  Vital Signs: BP 107/76 (BP Location: Left Arm)   Pulse 64  Temp 97.6 F (36.4 C) (Oral)   Resp 20   Ht '5\' 11"'$  (1.803 m)   Wt 52.1 kg Comment: scale a  SpO2 97%   BMI 16.01 kg/m  SpO2: SpO2: 97 % O2 Device: O2 Device: Room Air O2 Flow Rate:    Intake/output summary:   Intake/Output Summary (Last 24 hours) at 03/02/2018 1557 Last data filed at 03/02/2018 1300 Gross per 24 hour  Intake 1080 ml  Output 1100 ml  Net -20 ml   LBM: Last BM Date: 03/01/18 Baseline Weight: Weight: 50.9 kg Most recent weight: Weight: 52.1 kg(scale a)       Palliative Assessment/Data: 50%      Patient Active Problem List   Diagnosis Date Noted  . Palliative care encounter   . Goals of care, counseling/discussion   . Encounter for hospice care discussion   . Heart failure ([place]) 02/28/2018  . Decreased cardiac ejection fraction 01/10/2018  . Nonspecific chest pain   . Chronic combined systolic and diastolic heart failure ([place])   . Unstable angina ([place]):  (01/20/2017) a. 3-v CAD w/ patent SVG to diag and patent sequential SVG to OM 2 and OM 3. Patent prox LAD stent. LIMA to LAD and SVG to RCA known occl. stable prox RCA sten  01/19/2017  . Claudication ([place]) 01/22/2014  . Chronic kidney disease 12/26/2013  . ICD (implantable cardioverter-defibrillator) in place 02/16/2013  . Ischemic cardiomyopathy 09/09/2012  . Postop check 07/05/2012  . STEMI (ST elevation myocardial infarction) ([place]) 05/18/2012  . Anemia in chronic kidney disease(285.21) 05/17/2012  . PE (pulmonary embolism) 05/17/2012  . Physical deconditioning 05/17/2012  . Anorexia 05/17/2012  .  Cardiogenic shock ([place]) 05/16/2012  . Arterial hypotension 05/16/2012  . Ventricular fibrillation arrest 05/10/2012  . Cardiac arrest ([place]) 05/10/2012  . Acute respiratory failure with hypoxia 2/2 cardiac arrest 05/10/2012  . Acute encephalopathy 2/2 cardiac arrest 05/10/2012  . Hyperglycemia 05/10/2012  . Odynophagia 05/07/2012  . Coronary artery disease 07/09/2011  . History of coronary artery bypass surgery 07/09/2011  . DYSPNEA 06/04/2010  . HLD (hyperlipidemia) 04/17/2009  . Essential hypertension 04/17/2009  . Asymptomatic old MI (myocardial infarction) 04/17/2009  . TOBACCO USE, QUIT 04/17/2009    Palliative Care Plan    Recommendations/Plan: Home with Hospice Services when ready  ICD is still activated at this point   Goals of Care and Additional Recommendations:  Limitations on Scope of Treatment: Avoid Hospitalization  Code Status:  No CPR  Prognosis:   < 6 months given rapid decline, low output heart failure with an EF of 15%, 14 lb wt loss in last 30 days, limited mobility.  Discharge Planning:  Home with Hospice  Care plan was discussed with patient and case management  Thank you for allowing the Palliative Medicine Team to assist in the care of this patient.  Total time spent:  90 min. Time in 2:30 Time out 4:00      Greater than 50%  of this time was spent counseling and coordinating care related to the above assessment and plan.  Florentina Jenny, [name] Palliative Medicine  Please contact Palliative MedicineTeam phone at [phone] for questions and concerns between 7 am - 7 pm.   Please see AMION for individual provider pager numbers.

## 2018-03-02 NOTE — Care Management (Signed)
03-02-18  BENEFIT CHECK :  # 2.  S/W  LINDA  @   [facility]  RX # [id number]  ELIQUIS  2.5 MG  BID COVER- YES CO-PAY- $ 45.00 TIER- 3 DRUG PRIOR  APPROVAL- NO  APIXABAN : NONE FORMULARY  PREFERRED  PHARMACY :  YES [facility]  AND [facility]  M/O 90 DAY SUPPLY FOR M/O  $ 125.00

## 2018-03-02 NOTE — Progress Notes (Addendum)
Advanced Heart Failure Rounding Note  PCP-Cardiologist: Kirk Ruths, MD   Subjective:    Creatinine stable 1.27. Weight up 2 lbs. Received gentle hydration yesterday after RHC showed low filling pressures. Lasix held.  Started on Eliquis for afib noted on ICD interrogation. Remains Apaced 60s. Denies bleeding.  PT recommending HH PT and 24 hour supervision with rolling walker. Palliative met with him and son yesterday. They are leaning toward SNF with palliative for right now.  Feeling better this morning. Drinking ensures and feels a little stronger. No SOB, CP, or dizziness. Very concerned about medicare coverage for his hospitalization. (CM and CSW consults in) He is also worried about getting discharged without a plan in place.  [place] 03/01/18: RA = 2 RV = 25/1 PA = 20/2 (9) PCW = 1 Fick cardiac output/index = 4.1/2.5 PVR = 2.0 WU FA sat = 98% PA sat = 70%, 72% SVC sat = 72%  Objective:   Weight Range: 52.1 kg Body mass index is 16.01 kg/m.   Vital Signs:   Temp:  [97.7 F (36.5 C)-98.3 F (36.8 C)] 98.3 F (36.8 C) (09/25 0456) Pulse Rate:  [50-86] 59 (09/25 0456) Resp:  [12-23] 18 (09/25 0456) BP: (97-141)/(64-93) 107/71 (09/25 0456) SpO2:  [0 %-100 %] 99 % (09/25 0456) Weight:  [52.1 kg] 52.1 kg (09/25 0456) Last BM Date: ("a week ago")  Weight change: Filed Weights   02/28/18 1532 03/01/18 0524 03/02/18 0456  Weight: 50.9 kg 51.2 kg 52.1 kg    Intake/Output:   Intake/Output Summary (Last 24 hours) at 03/02/2018 0757 Last data filed at 03/02/2018 0500 Gross per 24 hour  Intake 1749.21 ml  Output 550 ml  Net 1199.21 ml      Physical Exam    General:Thin. Cachectic. No resp difficulty. HEENT: Normal Neck: Supple. JVP flat. Carotids 2+ bilat; no bruits. No thyromegaly or nodule noted. Cor: PMI nondisplaced. RRR, +S3, large pectus defect Lungs: CTAB, normal effort. Abdomen: Soft, non-tender, non-distended, no HSM. No bruits or masses. +BS   Extremities: No cyanosis, clubbing, or rash. R and LLE no edema.  Neuro: Alert & orientedx3, cranial nerves grossly intact. moves all 4 extremities w/o difficulty. Affect pleasant  Telemetry   Apaced 60s. Personally reviewed.   EKG    No new tracings.   Labs    CBC Recent Labs    02/28/18 1245 03/01/18 1157  WBC 7.4 6.0  HGB 14.6 13.3  HCT 46.4 41.3  MCV 100.7* 99.3  PLT 159 683*   Basic Metabolic Panel Recent Labs    03/01/18 0546 03/01/18 1157 03/02/18 0512  NA 142  --  142  K 3.9  --  4.0  CL 106  --  107  CO2 29  --  30  GLUCOSE 86  --  103*  BUN 26*  --  30*  CREATININE 1.40* 1.31* 1.27*  CALCIUM 8.8*  --  8.7*   Liver Function Tests No results for input(s): AST, ALT, ALKPHOS, BILITOT, PROT, ALBUMIN in the last 72 hours. No results for input(s): LIPASE, AMYLASE in the last 72 hours. Cardiac Enzymes No results for input(s): CKTOTAL, CKMB, CKMBINDEX, TROPONINI in the last 72 hours.  BNP: BNP (last 3 results) Recent Labs    02/28/18 1246  BNP 275.3*    ProBNP (last 3 results) No results for input(s): PROBNP in the last 8760 hours.   D-Dimer No results for input(s): DDIMER in the last 72 hours. Hemoglobin A1C No results for input(s): HGBA1C  in the last 72 hours. Fasting Lipid Panel No results for input(s): CHOL, HDL, LDLCALC, TRIG, CHOLHDL, LDLDIRECT in the last 72 hours. Thyroid Function Tests No results for input(s): TSH, T4TOTAL, T3FREE, THYROIDAB in the last 72 hours.  Invalid input(s): FREET3  Other results:   Imaging    No results found.   Medications:     Scheduled Medications: . apixaban  2.5 mg Oral BID  . aspirin EC  81 mg Oral Daily  . atorvastatin  20 mg Oral q1800  . cholecalciferol  1,000 Units Oral Daily  . docusate sodium  100 mg Oral BID  . feeding supplement (ENSURE ENLIVE)  237 mL Oral TID BM  . lisinopril  1.25 mg Oral Daily  . multivitamin with minerals  1 tablet Oral Daily  . senna  2 tablet Oral QHS   . sodium chloride flush  3 mL Intravenous Q12H  . vitamin B-12  1,000 mcg Oral Daily    Infusions: . sodium chloride      PRN Medications: sodium chloride, acetaminophen, ondansetron (ZOFRAN) IV, sodium chloride flush    Patient Profile   Louis Reid a 84 y.o.malewith PMH of CAD s/p CABG in 1996 with STEMI in 05/2012 and cath showing patent SVG-OM1-OM2 and SVG-D1 with occluded SVG-RCA and atretic LIMA-LAD with thrombectomy of proximal LAD occlusion, no significant changes on cath in 01/2017), HTN, HLD, ICM (s/p ICD placement in 2014), and chronic combined systolic and diastolic CHF.   Admitted from HF clinic with concern for low output in end stage heart failure.   Assessment/Plan   1. Chronic systolic CHF, ICM - Echo 9/62/22 LVEF 15%, Grade 1 DD, Trivial MR, Mildly reduced RV function, PA peak pressure 32 mm Hg.  - s/p St. Jude ICD  - EF dropped from 20-25% -> 15% from 01/2017 - 2019. Though by visual review it does not appear to have decreased by much.  - RHC 03/01/18 with low filling pressures and mildly reduced CO. - Volume status dry. Lasix on hold.  - Dig level checked again this am and was 1.2. Continue to hold. - No BB with concern for low output.  - Continue Lisinopril 1.25 mg daily. SBP 100-110s - Palliative care following. He is leaning toward SNF with palliative at this time. CM and CSW consults in. - He is not a candidate for VAD or transplant. 2. CAD s/p CABG - Most recent cath 01/2017 with patent SVG to diagonal, patent SVG to OM 2, and OM 3. Patent proximal LAD stent. LIMA to LAD and SVG to RCA known occluded.  - Continue ASA, statin. BB stopped with concern for low output.  - No s/s ischemia. 3. PAF - On ICD interrogation - afib for 6 hours on 9/13.  - Started on eliquis 2.5 mg BID last night after cath. CM to check cost. We discussed the reasoning for eliquis today. 4. HLD - Continue statin 5. Disposition - Palliative following. He is leaning away  from hospice and toward SNF with palliative at this point. - PT recommending HH PT and 24 hour supervision, but he does not have resources for 24 hour supervision. CM and CSW consults ordered.  - He is a limited code. He would like intubation, but nothing else.     Length of Stay: [place], NP  03/02/2018, 7:57 AM  Advanced Heart Failure Team Pager [phone] (M-F; 7a - 4p)  Please contact [facility] for night-coverage after hours (4p -7a ) and weekends  on [email or web address]  Patient seen and examined with the above-signed Advanced Practice Provider and/or Housestaff. I personally reviewed laboratory data, imaging studies and relevant notes. I independently examined the patient and formulated the important aspects of the plan. I have edited the note to reflect any of my changes or salient points. I have personally discussed the plan with the patient and/or family.  [place] numbers reviewed with him and his son. Cardiac output ok. Volume status low. Diuretics on hold. He is now feeling better. Has decided on home with [facility]. Will plan for d/c in am.   Glori Bickers, MD  1:53 PM

## 2018-03-02 NOTE — Social Work (Signed)
CSW acknowledging consult notes, understand that pt has elected to discharge home with hospice services.   CSW signing off. Please consult if any additional needs arise.  Alexander Mt, [place] Work [phone]

## 2018-03-02 NOTE — Discharge Instructions (Addendum)
[facility] Stay Proper nutrition can help your body recover from illness and injury.   Foods and beverages high in protein, vitamins, and minerals help rebuild muscle loss, promote healing, & reduce fall risk.   In addition to eating healthy foods, a nutrition shake is an easy, delicious way to get the nutrition you need during and after your hospital stay  It is recommended that you continue to drink 2 bottles per day of:       Ensure Plus for at least 1 month (30 days) after your hospital stay   Tips for adding a nutrition shake into your routine: As allowed, drink one with vitamins or medications instead of water or juice Enjoy one as a tasty mid-morning or afternoon snack Drink cold or make a milkshake out of it Drink one instead of milk with cereal or snacks Use as a coffee creamer   Available at the following grocery stores and pharmacies:           * [place] [phone]            For COUPONS visit: www.[email or web address].com/join or [email or web address]   Suggested Substitutions Ensure Plus = Boost Plus = Carnation Breakfast Essentials = Boost Compact Ensure Active Clear = Boost Breeze Glucerna Shake = Boost Glucose Control = Carnation Breakfast Essentials SUGAR FREE     Information on my medicine - ELIQUIS (apixaban)  This medication education was reviewed with me or my healthcare representative as part of my discharge preparation.  The pharmacist that spoke with me during my hospital stay was:  Einar Grad, [facility]  Why was Eliquis prescribed for you? Eliquis was prescribed for you to reduce the risk of a blood clot forming that can cause a stroke if you have a medical condition called atrial fibrillation (a type of irregular heartbeat).  What do You need to know about Eliquis ? Take your  Eliquis TWICE DAILY - one tablet in the morning and one tablet in the evening with or without food. If you have difficulty swallowing the tablet whole please discuss with your pharmacist how to take the medication safely.  Take Eliquis exactly as prescribed by your doctor and DO NOT stop taking Eliquis without talking to the doctor who prescribed the medication.  Stopping may increase your risk of developing a stroke.  Refill your prescription before you run out.  After discharge, you should have regular check-up appointments with your healthcare provider that is prescribing your Eliquis.  In the future your dose may need to be changed if your kidney function or weight changes by a significant amount or as you get older.  What do you do if you miss a dose? If you miss a dose, take it as soon as you remember on the same day and resume taking twice daily.  Do not take more than one dose of ELIQUIS at the same time to make up a missed dose.  Important Safety Information A possible side effect of Eliquis is bleeding. You should call your healthcare provider right away if you experience any of the following: ? Bleeding from an injury or your nose that does not stop. ? Unusual colored urine (red or dark  brown) or unusual colored stools (red or black). ? Unusual bruising for unknown reasons. ? A serious fall or if you hit your head (even if there is no bleeding).  Some medicines may interact with Eliquis and might increase your risk of bleeding or clotting while on Eliquis. To help avoid this, consult your healthcare provider or pharmacist prior to using any new prescription or non-prescription medications, including herbals, vitamins, non-steroidal anti-inflammatory drugs (NSAIDs) and supplements.  This website has more information on Eliquis (apixaban): http://www.eliquis.com/eliquis/home

## 2018-03-02 NOTE — Progress Notes (Signed)
Patient resting comfortably during shift report. Denies complaints.  

## 2018-03-02 NOTE — Progress Notes (Signed)
[facility]) RN Visit  Received referral from CM that pt requested to go home with hospice, choosing [facility].  Chart reviewed, met with pt, eligibility is currently under review by [facility] physician.   Spoke with pt and his clergy at his bedside.  Answered questions and concerns related to hospice philosophy.    Will follow up tomorrow, once eligibility has been determined.  Thank you for this referral, Venia Carbon BSN, [facility] Liaison [phone]  [facility] Liaisons are listed in [name]

## 2018-03-02 NOTE — Progress Notes (Signed)
Physical Therapy Treatment Patient Details Name: Louis Reid MRN: [id number] DOB: [birth year] Today's Date: 03/02/2018    History of Present Illness Louis Reid is a 84 y.o. male with PMH of CAD s/p CABG in 1996 with STEMI in 05/2012, HTN, HLD, ICM (s/p ICD placement in 2014), and chronic combined systolic and diastolic CHF. Admitted for inability to amb >10' and 11pound weight loss in 4 weeks.    PT Comments    Pt much reports feeling much better than when he came in on Monday. Pt strongly desires to return home. Pt was able to ambulate 100'x2 without AD, safety, and without episode of LOB. Pt reports "this is more than I walk at home." Pt completed stair negotiation safely. Recommend 24/7 initially for a few days. Pt reports his son is going to stay with him for a few day.s   Follow Up Recommendations  Home health PT;Supervision/Assistance - 24 hour(24/7 for first few days)     Equipment Recommendations  None recommended by PT    Recommendations for Other Services       Precautions / Restrictions Precautions Precautions: Fall Precaution Comments: ICD Restrictions Weight Bearing Restrictions: No    Mobility  Bed Mobility Overal bed mobility: Modified Independent             General bed mobility comments: HOB elevated, no difficulty  Transfers Overall transfer level: Modified independent Equipment used: None             General transfer comment: pt powered up pushing up from bed, no difficulty  Ambulation/Gait Ambulation/Gait assistance: Min guard Gait Distance (Feet): 100 Feet(x1, 130x1) Assistive device: None Gait Pattern/deviations: Decreased stride length;Wide base of support;Step-through pattern(decreased step heigth) Gait velocity: slow Gait velocity interpretation: 1.31 - 2.62 ft/sec, indicative of limited community ambulator General Gait Details:  pt with decreased step length compared to with walker from yesterday however pt steady, no episode  of LOB, pt reports LE weawkness but no overall fatigue he had when he came into hospital on monday. pt ask to rest prior to do stairs, pt then completed stair negotiation and walked all the way back to the room   Stairs Stairs: Yes Stairs assistance: Min guard Stair Management: One rail Right Number of Stairs: 5(to mimic home entrance) General stair comments: able to ascend/descend without difficulty or onset of fatigue   Wheelchair Mobility    Modified Rankin (Stroke Patients Only)       Balance Overall balance assessment: Mild deficits observed, not formally tested                                          Cognition Arousal/Alertness: Awake/alert Behavior During Therapy: WFL for tasks assessed/performed Overall Cognitive Status: Within Functional Limits for tasks assessed                                        Exercises      General Comments        Pertinent Vitals/Pain Pain Assessment: No/denies pain    Home Living                      Prior Function            PT Goals (current goals can now be found in  the care plan section) Progress towards PT goals: Progressing toward goals    Frequency    Min 3X/week      PT Plan Discharge plan needs to be updated    Co-evaluation              AM-PAC PT "6 Clicks" Daily Activity  Outcome Measure  Difficulty turning over in bed (including adjusting bedclothes, sheets and blankets)?: None Difficulty moving from lying on back to sitting on the side of the bed? : None Difficulty sitting down on and standing up from a chair with arms (e.g., wheelchair, bedside commode, etc,.)?: None Help needed moving to and from a bed to chair (including a wheelchair)?: None Help needed walking in hospital room?: A Little Help needed climbing 3-5 steps with a railing? : A Little 6 Click Score: 22    End of Session Equipment Utilized During Treatment: Gait belt Activity  Tolerance: Patient tolerated treatment well Patient left: in bed;with call bell/phone within reach;with bed alarm set Nurse Communication: Mobility status PT Visit Diagnosis: Unsteadiness on feet (R26.81)     Time: 8563-1497 PT Time Calculation (min) (ACUTE ONLY): 19 min  Charges:  $Gait Training: 8-22 mins                     Kittie Plater, PT, DPT Acute Rehabilitation Services Pager #: [phone] Office #: [phone]    Berline Lopes 03/02/2018, 1:47 PM

## 2018-03-02 NOTE — Care Management Note (Signed)
Case Management Note  Patient Details  Name: Louis Reid MRN: [id number] Date of Birth: 1932-08-19  Subjective/Objective:                 CHF EF 15%   Action/Plan:  Had several lengthy conversations with patient today at bedside. Very pleasant gentlemen I met with him and his son this morning. We discussed disposition plans, home vs SNF. Patient voiced preference of HH, son in agreement. Son lives in [place], however he states that patient has neighbors that check on him daily. Discussed level of support provided by [facility] services, and that services would not be provided daily. Son's concern was how the patient would get meals. Discussed private duty aid, and Meals on Wheels. Discussed [place]. Advised patient to call to get clarification from insurer, as CM cannot interpret his policy for him. Patient and son agreeable and appreciative.  Met with patient along with M Dellinger PA Palliative. Provided with private duty resources and Eliquis card. Discussed [place] vs Home Hospice. Patient eventually, with guidance agreed to home hospice care through [facility]. Referral made to [facility]. She states she will meet with patient at bedside later today. Patient declines Meals on Wheels and states he has number himself and he will call if he changes his mind. Son will provide transportation home.   Expected Discharge Date:                  Expected Discharge Plan:     In-House Referral:     Discharge planning Services  CM Consult  Post Acute Care Choice:    Choice offered to:  Patient  DME Arranged:    DME Agency:     HH Arranged:    [place] Agency:  [facility]  Status of Service:  In process, will continue to follow  If discussed at Long Length of Stay Meetings, dates discussed:    Additional Comments:  Carles Collet, RN 03/02/2018, 3:37 PM

## 2018-03-03 DIAGNOSIS — I5023 Acute on chronic systolic (congestive) heart failure: Secondary | ICD-10-CM | POA: Diagnosis not present

## 2018-03-03 DIAGNOSIS — I5042 Chronic combined systolic (congestive) and diastolic (congestive) heart failure: Secondary | ICD-10-CM | POA: Diagnosis not present

## 2018-03-03 DIAGNOSIS — I110 Hypertensive heart disease with heart failure: Secondary | ICD-10-CM | POA: Diagnosis not present

## 2018-03-03 LAB — BASIC METABOLIC PANEL
Anion gap: 4 — ABNORMAL LOW (ref 5–15)
BUN: 27 mg/dL — AB (ref 8–23)
CO2: 29 mmol/L (ref 22–32)
Calcium: 8.7 mg/dL — ABNORMAL LOW (ref 8.9–10.3)
Chloride: 108 mmol/L (ref 98–111)
Creatinine, Ser: 1.16 mg/dL (ref 0.61–1.24)
GFR calc Af Amer: >60 mL/min (ref >60)
GFR calc non Af Amer: 56 mL/min — ABNORMAL LOW (ref >60)
GLUCOSE: 90 mg/dL (ref 70–99)
POTASSIUM: 4.2 mmol/L (ref 3.5–5.1)
Sodium: 141 mmol/L (ref 135–145)

## 2018-03-03 LAB — GLUCOSE, CAPILLARY: GLUCOSE-CAPILLARY: 108 mg/dL — AB (ref 70–99)

## 2018-03-03 MED ORDER — ENSURE ENLIVE PO LIQD: 237.0000 mL | Freq: Three times a day (TID) | ORAL | 12 refills | Status: DC

## 2018-03-03 MED ORDER — APIXABAN 2.5 MG PO TABS: 2.5000 mg | ORAL_TABLET | Freq: Two times a day (BID) | ORAL | 5 refills | Status: DC

## 2018-03-03 NOTE — Discharge Summary (Addendum)
Advanced Heart Failure Discharge Note  Discharge Summary   Patient ID: Louis Reid MRN: [id number], DOB/AGE: April 14, 1933 84 y.o. Admit date: 02/28/2018 D/C date:     03/03/2018   Primary Discharge Diagnoses:  1. Chronic systolic HF due to ICM - End stage. Declined milrinone. Not a candidate for other advanced therapies - Home with hospice 2. CAD s/p CABG 3. PAF - Noted on ICD interrogation. Started on eliquis this admission. 4. HLD 5. Volume depletion   Hospital Course:  JOURDEN GILSON is a 84 y.o. male with PMH of CAD s/p CABG in 1996 with STEMI in 05/2012 and cath showing patent SVG-OM1-OM2 and SVG-D1 with occluded SVG-RCA and atretic LIMA-LAD with thrombectomy of proximal LAD occlusion, no significant changes on cath in 01/2017), HTN, HLD, ICM (s/p ICD placement in 2014), and chronic combined systolic and diastolic CHF.   Admitted from HF clinic 02/28/18 with concerns for low output HF. RHC showed low filling pressures and mildly reduced CO. He was given gentle hydration post cath. Digoxin was stopped with elevated dig levels. Beta blocker stopped with low output. Lasix stopped with dehydration. Palliative was consulted and he ultimately decided to go home with hospice services. He requested to leave his ICD on for now. Hospice RN will readdress code status at follow up (he was a partial code here).   He had new afib noted on ICD interrogation. He was started on Eliquis this admission. He was okay with $45 monthly copay. He remained A-paced on tele.   He will be followed closely in HF clinic, with appointment as below. He will also be followed by [facility] for hospice at home.   Discharge Weight Range: 115.6 lbs Discharge Vitals: Blood pressure 114/61, pulse 65, temperature 98.4 F (36.9 C), temperature source Oral, resp. rate 18, height 5\' 11"  (1.803 m), weight 52.4 kg, SpO2 96 %.  Labs: Lab Results  Component Value Date   WBC 6.0 03/01/2018   HGB 13.3 03/01/2018   HCT 41.3 03/01/2018   MCV 99.3 03/01/2018   PLT 120 (L) 03/01/2018    Recent Labs  Lab 03/03/18 0602  NA 141  K 4.2  CL 108  CO2 29  BUN 27*  CREATININE 1.16  CALCIUM 8.7*  GLUCOSE 90   Lab Results  Component Value Date   CHOL 89 (L) 08/24/2016   HDL 37 (L) 08/24/2016   LDLCALC 39 08/24/2016   TRIG 65 08/24/2016   BNP (last 3 results) Recent Labs    02/28/18 1246  BNP 275.3*    ProBNP (last 3 results) No results for input(s): PROBNP in the last 8760 hours.   Diagnostic Studies/Procedures   RHC 03/01/18: RA = 2 RV = 25/1 PA = 20/2 (9) PCW = 1 Fick cardiac output/index = 4.1/2.5 PVR = 2.0 WU FA sat = 98% PA sat = 70%, 72% SVC sat = 72%  Discharge Medications   Allergies as of 03/03/2018      Reactions   Cold Medicine Plus [chlorphen-pseudoephed-apap]    Reaction a long time ago - made him feel goofy      Medication List    STOP taking these medications   aspirin EC 81 MG tablet   digoxin 0.125 MG tablet Commonly known as:  LANOXIN   furosemide 20 MG tablet Commonly known as:  LASIX     TAKE these medications   apixaban 2.5 MG Tabs tablet Commonly known as:  ELIQUIS Take 1 tablet (2.5 mg total)  by mouth 2 (two) times daily.   atorvastatin 20 MG tablet Commonly known as:  LIPITOR TAKE 1 TABLET DAILY AT 6 PM   cyanocobalamin 1000 MCG tablet Take 1,000 mcg by mouth daily. What changed:  Another medication with the same name was removed. Continue taking this medication, and follow the directions you see here.   docusate sodium 100 MG capsule Commonly known as:  COLACE Take 100 mg by mouth 2 (two) times daily.   feeding supplement (ENSURE ENLIVE) Liqd Take 237 mLs by mouth 3 (three) times daily between meals.   ICAPS AREDS 2 PO Take 1 tablet by mouth 2 (two) times daily.   lisinopril 2.5 MG tablet Commonly known as:  PRINIVIL,ZESTRIL Take 1.25 mg by mouth daily.   nitroGLYCERIN 0.4 MG SL tablet Commonly known  as:  NITROSTAT Place 1 tablet (0.4 mg total) under the tongue every 5 (five) minutes x 3 doses as needed for chest pain. Take only as needed for chest pain if your blood pressure is greater than 100/80.       Disposition   The patient will be discharged in stable condition to home with home hospice. Discharge Instructions    (HEART FAILURE PATIENTS) Call MD:  Anytime you have any of the following symptoms: 1) 3 pound weight gain in 24 hours or 5 pounds in 1 week 2) shortness of breath, with or without a dry hacking cough 3) swelling in the hands, feet or stomach 4) if you have to sleep on extra pillows at night in order to breathe.   Complete by:  As directed    Diet - low sodium heart healthy   Complete by:  As directed    Increase activity slowly   Complete by:  As directed    STOP any activity that causes chest pain, shortness of breath, dizziness, sweating, or exessive weakness   Complete by:  As directed      Follow-up Information    [place] Follow up on 03/15/2018.   Specialty:  Cardiology Why:  Heart Failure [facility] Followup at 2:30 pm.  Garage Code: [id number]  Take all am meds, bring all med bottles/med lists. Contact information: [address] [id number] [place] [address] [phone]       [facility] [place] Follow up.   Contact information: [place] [phone]            Duration of Discharge Encounter: Greater than 35 minutes   Signed, Georgiana Shore, NP 03/03/2018, 10:47 AM   Agree with above. He is improved with volume resuscitation. [place] for d/c home with Hospice.   Glori Bickers, MD  8:06 PM

## 2018-03-03 NOTE — Progress Notes (Addendum)
Advanced Heart Failure Rounding Note  PCP-Cardiologist: Kirk Ruths, MD   Subjective:    Creatinine stable 1.16. Weight up another 1 lb. Lasix on hold.  Planning for home hospice, hopefully today. ICD still on.  Eliquis will be $45/month. He says this is fine.  Feeling better. Appetite picking up. No SOB or dizziness. Wants to keep ICD on.   [place] 03/01/18: RA = 2 RV = 25/1 PA = 20/2 (9) PCW = 1 Fick cardiac output/index = 4.1/2.5 PVR = 2.0 WU FA sat = 98% PA sat = 70%, 72% SVC sat = 72%  Objective:   Weight Range: 52.4 kg Body mass index is 16.12 kg/m.   Vital Signs:   Temp:  [97.6 F (36.4 C)-98.4 F (36.9 C)] 98.4 F (36.9 C) (09/26 0533) Pulse Rate:  [63-75] 63 (09/26 0533) Resp:  [19-20] 19 (09/26 0533) BP: (97-110)/(59-76) 97/66 (09/26 0533) SpO2:  [96 %-97 %] 96 % (09/26 0533) Weight:  [52.4 kg] 52.4 kg (09/26 0533) Last BM Date: 02/22/18  Weight change: Filed Weights   03/01/18 0524 03/02/18 0456 03/03/18 0533  Weight: 51.2 kg 52.1 kg 52.4 kg    Intake/Output:   Intake/Output Summary (Last 24 hours) at 03/03/2018 0806 Last data filed at 03/03/2018 0044 Gross per 24 hour  Intake 840 ml  Output 1450 ml  Net -610 ml      Physical Exam    General: Thin. Cachectic. No resp difficulty. HEENT: Normal Neck: Supple. JVP flat. Carotids 2+ bilat; no bruits. No thyromegaly or nodule noted. Cor: PMI nondisplaced. RRR, +S3, large pectus defect. Lungs: CTAB, normal effort. Abdomen: Soft, non-tender, non-distended, no HSM. No bruits or masses. +BS  Extremities: No cyanosis, clubbing, or rash. R and LLE no edema.  Neuro: Alert & orientedx3, cranial nerves grossly intact. moves all 4 extremities w/o difficulty. Affect pleasant  Telemetry   Apaced 60-70s. Personally reviewed.   EKG    No new tracings.   Labs    CBC Recent Labs    02/28/18 1245 03/01/18 1157  WBC 7.4 6.0  HGB 14.6 13.3  HCT 46.4 41.3  MCV 100.7* 99.3  PLT 159 120*    Basic Metabolic Panel Recent Labs    03/02/18 0512 03/03/18 0602  NA 142 141  K 4.0 4.2  CL 107 108  CO2 30 29  GLUCOSE 103* 90  BUN 30* 27*  CREATININE 1.27* 1.16  CALCIUM 8.7* 8.7*   Liver Function Tests No results for input(s): AST, ALT, ALKPHOS, BILITOT, PROT, ALBUMIN in the last 72 hours. No results for input(s): LIPASE, AMYLASE in the last 72 hours. Cardiac Enzymes No results for input(s): CKTOTAL, CKMB, CKMBINDEX, TROPONINI in the last 72 hours.  BNP: BNP (last 3 results) Recent Labs    02/28/18 1246  BNP 275.3*    ProBNP (last 3 results) No results for input(s): PROBNP in the last 8760 hours.   D-Dimer No results for input(s): DDIMER in the last 72 hours. Hemoglobin A1C No results for input(s): HGBA1C in the last 72 hours. Fasting Lipid Panel No results for input(s): CHOL, HDL, LDLCALC, TRIG, CHOLHDL, LDLDIRECT in the last 72 hours. Thyroid Function Tests No results for input(s): TSH, T4TOTAL, T3FREE, THYROIDAB in the last 72 hours.  Invalid input(s): FREET3  Other results:   Imaging    No results found.   Medications:     Scheduled Medications: . apixaban  2.5 mg Oral BID  . aspirin EC  81 mg Oral Daily  . atorvastatin  20 mg Oral q1800  . cholecalciferol  1,000 Units Oral Daily  . docusate sodium  100 mg Oral BID  . feeding supplement (ENSURE ENLIVE)  237 mL Oral TID BM  . lisinopril  1.25 mg Oral Daily  . multivitamin with minerals  1 tablet Oral Daily  . sodium chloride flush  3 mL Intravenous Q12H  . vitamin B-12  1,000 mcg Oral Daily    Infusions: . sodium chloride      PRN Medications: sodium chloride, acetaminophen, ondansetron (ZOFRAN) IV, sodium chloride flush    Patient Profile   Louis Reid a 84 y.o.malewith PMH of CAD s/p CABG in 1996 with STEMI in 05/2012 and cath showing patent SVG-OM1-OM2 and SVG-D1 with occluded SVG-RCA and atretic LIMA-LAD with thrombectomy of proximal LAD occlusion, no significant  changes on cath in 01/2017), HTN, HLD, ICM (s/p ICD placement in 2014), and chronic combined systolic and diastolic CHF.   Admitted from HF clinic with concern for low output in end stage heart failure.   Assessment/Plan   1. Chronic systolic CHF, ICM - Echo 4/40/34 LVEF 15%, Grade 1 DD, Trivial MR, Mildly reduced RV function, PA peak pressure 32 mm Hg.  - s/p St. Jude ICD. He wants to keep tachytherapies on for now. - EF dropped from 20-25% -> 15% from 01/2017 - 2019. Though by visual review it does not appear to have decreased by much.  - RHC 03/01/18 with low filling pressures and mildly reduced CO. - Volume status dry. Lasix on hold. Can likely just take PRN at discharge. - Dig on hold with elevated levels. - No BB with for low output.  - Continue Lisinopril 1.25 mg daily. SBP 90-100s - He would quality for hospice in setting of end stage heart failure. Pt has declined advanced therapies, including home milrinone. His expected prognosis is less than 6 months.  - He is not a candidate for VAD or transplant. 2. CAD s/p CABG - Most recent cath 01/2017 with patent SVG to diagonal, patent SVG to OM 2, and OM 3. Patent proximal LAD stent. LIMA to LAD and SVG to RCA known occluded.  - Continue statin. BB stopped with concern for low output. Stop ASA now that he is on Eliquis. - No s/s ischemia. 3. PAF - On ICD interrogation - afib for 6 hours on 9/13.  - Continue eliquis 2.5 mg BID. Cost is $45/month. He is fine with this. 4. HLD - Continue statin 5. Disposition - PT recommending HH PT and 24 hour supervision, but he does not have resources for 24 hour supervision. Now learning toward home with hospice. Eligibility is pending. - He is a limited code. He would like intubation, but nothing else. He wants to keep tachytherapies on for now.   Plan for DC today with home hospice. Eligibility pending. F/u has been scheduled.   Length of Stay: [place], NP  03/03/2018, 8:06  AM  Advanced Heart Failure Team Pager [phone] (M-F; 7a - 4p)  Please contact [facility] for night-coverage after hours (4p -7a ) and weekends on [email or web address]  Patient seen and examined with the above-signed Advanced Practice Provider and/or Housestaff. I personally reviewed laboratory data, imaging studies and relevant notes. I independently examined the patient and formulated the important aspects of the plan. I have edited the note to reflect any of my changes or salient points. I have personally discussed the plan with the patient and/or family.  He feels better with volume resuscitation.  [facility] for d/c home with hospice. Meds as above.   Glori Bickers, MD  8:05 PM

## 2018-03-03 NOTE — Progress Notes (Signed)
[facility])  Received eligibility confirmation this morning from Dr. Lyman Speller [facility]), that pt is eligible for hospice services at home after discharge.    Patient does not currently have any DME needs (states he has a cane and walker if needed).  His plan is to have his son [name] (driving in from [place]) to transport him home once he is discharged.  [facility] has no requests for DME at this time.  Please send signed and completed DNR form home with the patient.  Patient will need prescriptions for discharge comfort medications (if needed).  [facility] is aware of the above.  Completed discharge summary will need to be faxed to [facility] at [phone] when final.    [facility] information and contact numbers given to Jaquelyn Bitter.  Notified  RN CM of above information.  Please call with any hospice related questions or concerns.  Thank you, Venia Carbon BSN, [facility] Liaison [phone]  [facility] Liaisons are listed in [facility]

## 2018-03-07 ENCOUNTER — Ambulatory Visit: Payer: Medicare Other | Admitting: Cardiology

## 2018-03-08 ENCOUNTER — Encounter: Payer: Self-pay | Admitting: *Deleted

## 2018-03-09 ENCOUNTER — Telehealth (HOSPITAL_COMMUNITY): Payer: Self-pay

## 2018-03-09 NOTE — Telephone Encounter (Signed)
His eliquis is not likely causing the chest pain. It is a blood thinner to prevent him from having a stroke. He should not take aspirin with the eliquis. He is followed by hospice, so they should be able to help treat his pain. He also has nitro SL at home if he thinks it is cardiac chest pain.

## 2018-03-09 NOTE — Telephone Encounter (Signed)
Pt was recently put on Eliquis 2.5 mg twice daily. Pt is complaining with chest tightness since starting Eliquis and would like to know if he can take an aspirin instead. Please advise.

## 2018-03-09 NOTE — Telephone Encounter (Signed)
Pt notified and verbalizes understanding.

## 2018-03-11 ENCOUNTER — Other Ambulatory Visit: Payer: Self-pay | Admitting: Internal Medicine

## 2018-03-15 ENCOUNTER — Ambulatory Visit (HOSPITAL_COMMUNITY)
Admission: RE | Admit: 2018-03-15 | Discharge: 2018-03-15 | Disposition: A | Discharge status: Home / Self Care | Source: Ambulatory Visit | Attending: Cardiology | Admitting: Cardiology

## 2018-03-15 ENCOUNTER — Encounter (HOSPITAL_COMMUNITY): Payer: Self-pay

## 2018-03-15 VITALS — BP 126/80 | HR 81 | Wt 123.2 lb

## 2018-03-15 DIAGNOSIS — Z87891 Personal history of nicotine dependence: Secondary | ICD-10-CM | POA: Diagnosis not present

## 2018-03-15 DIAGNOSIS — I25810 Atherosclerosis of coronary artery bypass graft(s) without angina pectoris: Secondary | ICD-10-CM | POA: Diagnosis not present

## 2018-03-15 DIAGNOSIS — Z95810 Presence of automatic (implantable) cardiac defibrillator: Secondary | ICD-10-CM | POA: Diagnosis not present

## 2018-03-15 DIAGNOSIS — Z79899 Other long term (current) drug therapy: Secondary | ICD-10-CM | POA: Insufficient documentation

## 2018-03-15 DIAGNOSIS — I110 Hypertensive heart disease with heart failure: Secondary | ICD-10-CM | POA: Insufficient documentation

## 2018-03-15 DIAGNOSIS — I252 Old myocardial infarction: Secondary | ICD-10-CM | POA: Diagnosis not present

## 2018-03-15 DIAGNOSIS — I480 Paroxysmal atrial fibrillation: Secondary | ICD-10-CM | POA: Insufficient documentation

## 2018-03-15 DIAGNOSIS — I10 Essential (primary) hypertension: Secondary | ICD-10-CM

## 2018-03-15 DIAGNOSIS — E785 Hyperlipidemia, unspecified: Secondary | ICD-10-CM | POA: Diagnosis not present

## 2018-03-15 DIAGNOSIS — I2510 Atherosclerotic heart disease of native coronary artery without angina pectoris: Secondary | ICD-10-CM | POA: Insufficient documentation

## 2018-03-15 DIAGNOSIS — F329 Major depressive disorder, single episode, unspecified: Secondary | ICD-10-CM | POA: Diagnosis not present

## 2018-03-15 DIAGNOSIS — Z86711 Personal history of pulmonary embolism: Secondary | ICD-10-CM | POA: Diagnosis not present

## 2018-03-15 DIAGNOSIS — Z955 Presence of coronary angioplasty implant and graft: Secondary | ICD-10-CM | POA: Insufficient documentation

## 2018-03-15 DIAGNOSIS — Z7901 Long term (current) use of anticoagulants: Secondary | ICD-10-CM | POA: Diagnosis not present

## 2018-03-15 DIAGNOSIS — I5042 Chronic combined systolic (congestive) and diastolic (congestive) heart failure: Secondary | ICD-10-CM | POA: Insufficient documentation

## 2018-03-15 DIAGNOSIS — I255 Ischemic cardiomyopathy: Secondary | ICD-10-CM | POA: Insufficient documentation

## 2018-03-15 DIAGNOSIS — Z8674 Personal history of sudden cardiac arrest: Secondary | ICD-10-CM | POA: Insufficient documentation

## 2018-03-15 DIAGNOSIS — Z85828 Personal history of other malignant neoplasm of skin: Secondary | ICD-10-CM | POA: Insufficient documentation

## 2018-03-15 LAB — BASIC METABOLIC PANEL
ANION GAP: 5 (ref 5–15)
BUN: 24 mg/dL — ABNORMAL HIGH (ref 8–23)
CHLORIDE: 110 mmol/L (ref 98–111)
CO2: 26 mmol/L (ref 22–32)
Calcium: 8.8 mg/dL — ABNORMAL LOW (ref 8.9–10.3)
Creatinine, Ser: 1.21 mg/dL (ref 0.61–1.24)
GFR calc Af Amer: >60 mL/min (ref >60)
GFR, EST NON AFRICAN AMERICAN: 54 mL/min — AB (ref >60)
Glucose, Bld: 83 mg/dL (ref 70–99)
POTASSIUM: 4.3 mmol/L (ref 3.5–5.1)
SODIUM: 141 mmol/L (ref 135–145)

## 2018-03-15 NOTE — Progress Notes (Signed)
Advanced Heart Failure Clinic Consult Note   Referring Physician: Jory Sims, NP PCP: [name], MD PCP-Cardiologist: Kirk Ruths, MD  HF: Dr Haroldine Laws  Referring provider: Jory Sims, DNP  HPI:  Louis Reid is a 84 y.o. male with PMH of CAD s/p CABG in 1996 with STEMI in 05/2012 and cath showing patent SVG-OM1-OM2 and SVG-D1 with occluded SVG-RCA and atretic LIMA-LAD with thrombectomy of proximal LAD occlusion, no significant changes on cath in 01/2017), HTN, HLD, ICM (s/p ICD placement in 2014), and chronic combined systolic and diastolic CHF.   Seen in [facility] clinic 01/10/2018 with multiple complaints. This included fatigue and DOE. He had stopped lisinopril due to hypotension.  Repeat echo ordered as below which showed decrease in EF, so referred to HF clinic by Jory Sims for HF evaluation & med optimization.    Admitted from clinic 02/28/18 with concerns for low output HF. RHC as below showed low filling pressures and mildly reduced CO. Given gentle IVF. Palliative consulted as pt refused milrinone. Ultimately, decided for home with hospice with ICD on for now. Started on Eliquis for new Afib.   He presents today for post hospital follow up. Feeling much better (80% by his estimate) than prior to admission. He remains fatigued with mild exertion, but has no real SOB. He does have mild lightheadedness with standing. Appetite has improved. Taking all medication as directed. He has thoroughly enjoyed his experience with hospice care so far. Son has been helping him out. Watching salt and fluid intake. Not very active.   [place]: No VT/VF, <1% AT/AF; Thoracic impedence slightly depressed, but normalizing.   [place] 03/01/18: RA = 2 RV = 25/1 PA = 20/2 (9) PCW = 1 Fick cardiac output/index = 4.1/2.5 PVR = 2.0 WU FA sat = 98% PA sat = 70%, 72% SVC sat = 72%  Echo 01/17/18 LVEF 15%, Grade 1 DD, Trivial MR, Mildly reduced RV function, PA peak pressure 32 mm  Hg.   Echo 01/20/2017 LVEF 20-25%, Grade 1 DD, Mildly reduced RV function  LHC 01/20/2017  Ost Cx to Prox Cx lesion, 80 %stenosed.  Prox Cx to Mid Cx lesion, 100 %stenosed.  Prox LAD to Dist LAD lesion, 20 %stenosed.  Dist LAD lesion, 30 %stenosed.  Prox RCA lesion, 70 %stenosed.  Prox RCA to Mid RCA lesion, 30 %stenosed.  Ost 2nd Diag to 2nd Diag lesion, 100 %stenosed.  SVG.  The graft exhibits mild .  SVG.  Origin to Prox Graft lesion, 30 %stenosed.  Mid Graft lesion, 20 %stenosed.  SVG graft was not injected.  Origin lesion, 100 %stenosed.  Review of systems complete and found to be negative unless listed in HPI.    Past Medical History:  Diagnosis Date  . AICD (automatic cardioverter/defibrillator) present 09/13/2012  . Anemia   . CAD (coronary artery disease)    a. s/p remote CABG, b. AL STEMI c/b VF arrest in 05/2012 => LHC 05/10/12: Severe 3v CAD, S-OM1/2 ok, SVG-Dx ok, SVG-RCA occluded, LIMA-LAD atretic, proximal LAD occluded. EF 20% with anterior apical HK=> salvage PCI with POBA and thrombectomy of the occluded LAD;  c. 01/2017: atretic LIMA_LAD and known occluded SVG-RCA. Patent LAD stent and 70% proxRCA stenosis (FFR 0.88).   . Chronic systolic heart failure ([place])    a. Echo 12/13: EF 20-25%, anteroseptal, inferior, apical HK, mildly reduced RV function, trivial pericardial effusion;   b.  Echo 3/14:  Ant, septal, apical AK, EF 25%  . Clotting disorder ([place])   .  Depression    "years ago" (02/28/2018)  . History of kidney stones   . HLD (hyperlipidemia)   . Hypertension    "I've never had high blood pressure; on RX for other reasons" (02/28/2018)  . Inguinal hernia    "have one now on my left side" (09/13/2012); (02/28/2018)  . Ischemic cardiomyopathy   . Myocardial infarction [facility]) 1996;  05/2012  . Osteoporosis   . Paroxysmal atrial fibrillation ([place]) 03/17/2016   noted on device interrogation,  will follow burden  . Pectus excavatum   . Pulmonary  embolism ([place])    a. after adhesiolysis for SBO in 04/2012 => coumadin for 6 mos  . Renal cyst   . SBO (small bowel obstruction) [facility]) November 2013   s/p lysis of adhesions  . Sinoatrial node dysfunction ([facility])   . Skin cancer    "left ear; face" (02/28/2018)  . Ventricular fibrillation ([place]) 05/2012   . in setting of AL STEMI 12/13 => EF 20-25% => d/c on LifeVest (repeat echo planned in 08/2012)    Current Outpatient Medications  Medication Sig Dispense Refill  . atorvastatin (LIPITOR) 20 MG tablet TAKE 1 TABLET DAILY AT 6 PM 90 tablet 2  . cholecalciferol (VITAMIN D) 1000 units tablet Take 1,000 Units by mouth daily.    . cyanocobalamin 1000 MCG tablet Take 1,000 mcg by mouth daily.    Marland Kitchen docusate sodium (COLACE) 100 MG capsule Take 100 mg by mouth 2 (two) times daily.     Marland Kitchen lisinopril (PRINIVIL,ZESTRIL) 2.5 MG tablet Take 1.25 mg by mouth daily.    . Multiple Vitamins-Minerals (ICAPS AREDS 2 PO) Take 1 tablet by mouth 2 (two) times daily.     Marland Kitchen apixaban (ELIQUIS) 2.5 MG TABS tablet Take 1 tablet (2.5 mg total) by mouth 2 (two) times daily. 60 tablet 5  . feeding supplement, ENSURE ENLIVE, (ENSURE ENLIVE) LIQD Take 237 mLs by mouth 3 (three) times daily between meals. 237 mL 12  . nitroGLYCERIN (NITROSTAT) 0.4 MG SL tablet Place 1 tablet (0.4 mg total) under the tongue every 5 (five) minutes x 3 doses as needed for chest pain. Take only as needed for chest pain if your blood pressure is greater than 100/80. (Patient not taking: Reported on 02/28/2018) 25 tablet 12   No current facility-administered medications for this encounter.     Allergies  Allergen Reactions  . Cold Medicine Plus [Chlorphen-Pseudoephed-Apap]     Reaction a long time ago - made him feel goofy      Social History   Socioeconomic History  . Marital status: Widowed    Spouse name: Not on file  . Number of children: 1  . Years of education: Not on file  . Highest education level: Not on file  Occupational  History  . Occupation: [name]    Employer: RETIRED  Social Needs  . Financial resource strain: Not on file  . Food insecurity:    Worry: Not on file    Inability: Not on file  . Transportation needs:    Medical: Not on file    Non-medical: Not on file  Tobacco Use  . Smoking status: Former Smoker    Types: Cigarettes  . Smokeless tobacco: Never Used  . Tobacco comment: 09/14/2011; 02/28/2018  "quit smoking when I was ~ 17; probably didn't smoke 10 packs in my life"  Substance and Sexual Activity  . Alcohol use: Never    Frequency: Never  . Drug use: Never  .  Sexual activity: Not Currently  Lifestyle  . Physical activity:    Days per week: Not on file    Minutes per session: Not on file  . Stress: Not on file  Relationships  . Social connections:    Talks on phone: Not on file    Gets together: Not on file    Attends religious service: Not on file    Active member of club or organization: Not on file    Attends meetings of clubs or organizations: Not on file    Relationship status: Not on file  . Intimate partner violence:    Fear of current or ex partner: Not on file    Emotionally abused: Not on file    Physically abused: Not on file    Forced sexual activity: Not on file  Other Topics Concern  . Not on file  Social History Narrative  . Not on file    Family History  Problem Relation Age of Onset  . Heart disease Father   . Other Mother        brain tumor  . Colon cancer Brother   . Cancer Brother        Liver  . Heart disease Brother     Vitals:   03/15/18 1439  BP: 126/80  Pulse: 81  SpO2: 97%  Weight: 55.9 kg (123 lb 3.2 oz)   Wt Readings from Last 3 Encounters:  03/15/18 55.9 kg (123 lb 3.2 oz)  03/03/18 52.4 kg (115 lb 9.6 oz)  02/28/18 53.8 kg (118 lb 9.6 oz)    PHYSICAL EXAM: General: Elderly and thin. NAD. Chronically ill appearing.  HEENT: Normal Neck: Supple. JVP 6-7 cm. Carotids 2+ bilat; no bruits. No thyromegaly or nodule  noted. Cor: PMI lateral. IRR. Marked pectus excavatum defect. + S3.  Lungs: CTAB, normal effort. Abdomen: Soft, non-tender, non-distended, no HSM. No bruits or masses. +BS  Extremities: No cyanosis, clubbing, or rash. R and LLE no edema.  Neuro: Alert & orientedx3, cranial nerves grossly intact. moves all 4 extremities w/o difficulty. Affect pleasant   ASSESSMENT & PLAN:  1. Chronic systolic CHF, ICM - Echo 2/58/52 LVEF 15%, Grade 1 DD, Trivial MR, Mildly reduced RV function, PA peak pressure 32 mm Hg.  - s/p St. Jude ICD. He wants to keep tachytherapies on for now. - EF dropped from 20-25% ->15% from 01/2017 - 2019. Though by visual review it does not appear to have decreased by much.  - RHC 03/01/18 with low filling pressures and mildly reduced CO. - NYHA III symptoms - Volume status stable on exam.   - Continue lasix prn.  - Dig on hold with elevated levels. Consider resuming as needed, but now pursuing hospice care and very hesitant to start/increase medicines.  - No BB with for low output.  - Continue Lisinopril 1.25 mg daily for now. Can stop if lightheadedness continues/worsens. - Appreciate hospice support. He has declined advanced therapies, including home milrinone.  - He is not a candidate for VAD or transplant. 2. CAD s/p CABG - Most recent cath 01/2017 with patent SVG to diagonal, patent SVG to OM 2, and OM 3. Patent proximal LAD stent. LIMA to LAD and SVG to RCA known occluded.  - Continue statin. BB stopped with concern for low output. Stop ASA now that he is on Eliquis. - No s/s of ischemia.    3. PAF - On ICD interrogation  - Continue eliquis 2.5 mg BID. Cost is $45/month. He is  OK with this.  4. HLD - Continue statin.   Doing well. No room to up-titrate meds. Can stop lisinopril if lightheadedness worsens, wants to remains on for now. BMET today. RTC 6 weeks. Sooner with symptoms.   Shirley Friar, [name] 03/15/18    Greater than 50% of the 25 minute visit  was spent in counseling/coordination of care regarding disease state education, salt/fluid restriction, sliding scale diuretics, and medication compliance.

## 2018-03-15 NOTE — Patient Instructions (Signed)
Today you have been seen at the Heart failure clinic at [facility]    Lab work done today: BMET   Follow up with [place] PA in 6 weeks November 11 th at 2:00pm

## 2018-03-28 ENCOUNTER — Other Ambulatory Visit: Payer: Self-pay | Admitting: Cardiology

## 2018-03-28 NOTE — Telephone Encounter (Signed)
 *  STAT* If patient is at the pharmacy, call can be transferred to refill team.   1. Which medications need to be refilled? (please list name of each medication and dose if known)  apixaban (ELIQUIS) 2.5 MG TABS tablet  2. Which pharmacy/location (including street and city if local pharmacy) is medication to be sent to? [facility]  3. Do they need a 30 day or 90 day supply? 30 day

## 2018-03-29 MED ORDER — APIXABAN 2.5 MG PO TABS: 2.5000 mg | ORAL_TABLET | Freq: Two times a day (BID) | ORAL | 1 refills | Status: DC

## 2018-03-29 NOTE — Addendum Note (Signed)
Encounter addended by: Shirley Friar, [name] on: 03/29/2018 1:06 PM  Actions taken: LOS modified

## 2018-04-05 ENCOUNTER — Other Ambulatory Visit: Payer: Self-pay | Admitting: Internal Medicine

## 2018-04-18 ENCOUNTER — Other Ambulatory Visit: Payer: Self-pay

## 2018-04-18 ENCOUNTER — Ambulatory Visit (HOSPITAL_COMMUNITY)
Admission: RE | Admit: 2018-04-18 | Discharge: 2018-04-18 | Disposition: A | Discharge status: Home / Self Care | Source: Ambulatory Visit | Attending: Cardiology | Admitting: Cardiology

## 2018-04-18 VITALS — BP 122/68 | HR 71 | Wt 133.4 lb

## 2018-04-18 DIAGNOSIS — I255 Ischemic cardiomyopathy: Secondary | ICD-10-CM | POA: Insufficient documentation

## 2018-04-18 DIAGNOSIS — I25810 Atherosclerosis of coronary artery bypass graft(s) without angina pectoris: Secondary | ICD-10-CM | POA: Diagnosis not present

## 2018-04-18 DIAGNOSIS — I5022 Chronic systolic (congestive) heart failure: Secondary | ICD-10-CM | POA: Diagnosis present

## 2018-04-18 DIAGNOSIS — I5042 Chronic combined systolic (congestive) and diastolic (congestive) heart failure: Secondary | ICD-10-CM | POA: Insufficient documentation

## 2018-04-18 DIAGNOSIS — Z955 Presence of coronary angioplasty implant and graft: Secondary | ICD-10-CM | POA: Diagnosis not present

## 2018-04-18 DIAGNOSIS — Z86711 Personal history of pulmonary embolism: Secondary | ICD-10-CM | POA: Diagnosis not present

## 2018-04-18 DIAGNOSIS — Z7901 Long term (current) use of anticoagulants: Secondary | ICD-10-CM | POA: Insufficient documentation

## 2018-04-18 DIAGNOSIS — Z79899 Other long term (current) drug therapy: Secondary | ICD-10-CM | POA: Diagnosis not present

## 2018-04-18 DIAGNOSIS — Z95810 Presence of automatic (implantable) cardiac defibrillator: Secondary | ICD-10-CM | POA: Insufficient documentation

## 2018-04-18 DIAGNOSIS — Z87891 Personal history of nicotine dependence: Secondary | ICD-10-CM | POA: Insufficient documentation

## 2018-04-18 DIAGNOSIS — I110 Hypertensive heart disease with heart failure: Secondary | ICD-10-CM | POA: Insufficient documentation

## 2018-04-18 DIAGNOSIS — I10 Essential (primary) hypertension: Secondary | ICD-10-CM | POA: Diagnosis not present

## 2018-04-18 DIAGNOSIS — I480 Paroxysmal atrial fibrillation: Secondary | ICD-10-CM | POA: Insufficient documentation

## 2018-04-18 DIAGNOSIS — I252 Old myocardial infarction: Secondary | ICD-10-CM | POA: Diagnosis not present

## 2018-04-18 DIAGNOSIS — I2510 Atherosclerotic heart disease of native coronary artery without angina pectoris: Secondary | ICD-10-CM | POA: Diagnosis not present

## 2018-04-18 DIAGNOSIS — Z8249 Family history of ischemic heart disease and other diseases of the circulatory system: Secondary | ICD-10-CM | POA: Insufficient documentation

## 2018-04-18 DIAGNOSIS — Z8674 Personal history of sudden cardiac arrest: Secondary | ICD-10-CM | POA: Diagnosis not present

## 2018-04-18 DIAGNOSIS — E785 Hyperlipidemia, unspecified: Secondary | ICD-10-CM | POA: Diagnosis not present

## 2018-04-18 LAB — BASIC METABOLIC PANEL
Anion gap: 5 (ref 5–15)
BUN: 21 mg/dL (ref 8–23)
CALCIUM: 8.5 mg/dL — AB (ref 8.9–10.3)
CO2: 24 mmol/L (ref 22–32)
CREATININE: 1.29 mg/dL — AB (ref 0.61–1.24)
Chloride: 111 mmol/L (ref 98–111)
GFR calc Af Amer: 57 mL/min — ABNORMAL LOW (ref >60)
GFR calc non Af Amer: 50 mL/min — ABNORMAL LOW (ref >60)
Glucose, Bld: 85 mg/dL (ref 70–99)
Potassium: 4.3 mmol/L (ref 3.5–5.1)
SODIUM: 140 mmol/L (ref 135–145)

## 2018-04-18 LAB — BRAIN NATRIURETIC PEPTIDE: B NATRIURETIC PEPTIDE 5: 282.8 pg/mL — AB (ref 0.0–100.0)

## 2018-04-18 MED ORDER — FUROSEMIDE 40 MG PO TABS: ORAL_TABLET | ORAL | 11 refills | Status: DC

## 2018-04-18 NOTE — Patient Instructions (Signed)
Take 40 mg of Lasix for 2 days, then take 40 mg once a week thereafter.  Labs today We will only contact you if something comes back abnormal or we need to make some changes. Otherwise no news is good news!  Your physician recommends that you schedule a follow-up appointment in: 8 weeks  in the Advanced Practitioners (PA/NP) Clinic    Do the following things EVERYDAY: 1) Weigh yourself in the morning before breakfast. Write it down and keep it in a log. 2) Take your medicines as prescribed 3) Eat low salt foods-Limit salt (sodium) to 2000 mg per day.  4) Stay as active as you can everyday 5) Limit all fluids for the day to less than 2 liters

## 2018-04-18 NOTE — Progress Notes (Signed)
Advanced Heart Failure Clinic Note   Referring Physician: Jory Sims, NP PCP: Louis Downing, MD PCP-Cardiologist: Louis Ruths, MD  HF: [facility]  [facility]  HPI: Louis Reid is a 84 y.o. male with PMH of CAD s/p CABG in 1996 with STEMI in 05/2012 and cath showing patent SVG-OM1-OM2 and SVG-D1 with occluded SVG-RCA and atretic LIMA-LAD with thrombectomy of proximal LAD occlusion, no significant changes on cath in 01/2017), HTN, HLD, ICM (s/p ICD placement in 2014), and chronic combined systolic and diastolic CHF.   Seen in [facility] clinic 01/10/2018 with multiple complaints. This included fatigue and DOE. He had stopped lisinopril due to hypotension.  Repeat echo ordered as below which showed decrease in EF, so referred to HF clinic by Louis Reid for HF evaluation & med optimization.    Admitted from clinic 02/28/18 with concerns for low output HF. RHC as below showed low filling pressures and mildly reduced CO. Given gentle IVF. Palliative consulted as pt refused milrinone. Ultimately, decided for home with hospice with ICD on for now. Started on Eliquis for new Afib.   Today he returns for HF follow up. Overall feeling fine. Denies PND/Orthopnea. Denies chest pain. Appetite ok. No fever or chills. He has not been weighing at home. Over the last few days he has had increased leg edema. Taking all medications. He is followed by Louis Reid.   [place]: Impedance down.  [place] 03/01/18: RA = 2 RV = 25/1 PA = 20/2 (9) PCW = 1 Fick cardiac output/index = 4.1/2.5 PVR = 2.0 WU FA sat = 98% PA sat = 70%, 72% SVC sat = 72%  Echo 01/17/18 LVEF 15%, Grade 1 DD, Trivial MR, Mildly reduced RV function, PA peak pressure 32 mm Hg.   Echo 01/20/2017 LVEF 20-25%, Grade 1 DD, Mildly reduced RV function  LHC 01/20/2017  Ost Cx to Prox Cx lesion, 80 %stenosed.  Prox Cx to Mid Cx lesion, 100 %stenosed.  Prox LAD to Dist LAD lesion, 20 %stenosed.  Dist LAD  lesion, 30 %stenosed.  Prox RCA lesion, 70 %stenosed.  Prox RCA to Mid RCA lesion, 30 %stenosed.  Ost 2nd Diag to 2nd Diag lesion, 100 %stenosed.  SVG.  The graft exhibits mild .  SVG.  Origin to Prox Graft lesion, 30 %stenosed.  Mid Graft lesion, 20 %stenosed.  SVG graft was not injected.  Origin lesion, 100 %stenosed.  Review of systems complete and found to be negative unless listed in HPI.    Past Medical History:  Diagnosis Date  . AICD (automatic cardioverter/defibrillator) present 09/13/2012  . Anemia   . CAD (coronary artery disease)    a. s/p remote CABG, b. AL STEMI c/b VF arrest in 05/2012 => LHC 05/10/12: Severe 3v CAD, S-OM1/2 ok, SVG-Dx ok, SVG-RCA occluded, LIMA-LAD atretic, proximal LAD occluded. EF 20% with anterior apical HK=> salvage PCI with POBA and thrombectomy of the occluded LAD;  c. 01/2017: atretic LIMA_LAD and known occluded SVG-RCA. Patent LAD stent and 70% proxRCA stenosis (FFR 0.88).   . Chronic systolic heart failure ([place])    a. Echo 12/13: EF 20-25%, anteroseptal, inferior, apical HK, mildly reduced RV function, trivial pericardial effusion;   b.  Echo 3/14:  Ant, septal, apical AK, EF 25%  . Clotting disorder ([place])   . Depression    "years ago" (02/28/2018)  . History of kidney stones   . HLD (hyperlipidemia)   . Hypertension    "I've never had high blood pressure; on RX  for other reasons" (02/28/2018)  . Inguinal hernia    "have one now on my left side" (09/13/2012); (02/28/2018)  . Ischemic cardiomyopathy   . Myocardial infarction [facility]) 1996;  05/2012  . Osteoporosis   . Paroxysmal atrial fibrillation ([place]) 03/17/2016   noted on device interrogation,  will follow burden  . Pectus excavatum   . Pulmonary embolism ([place])    a. after adhesiolysis for SBO in 04/2012 => coumadin for 6 mos  . Renal cyst   . SBO (small bowel obstruction) [facility]) November 2013   s/p lysis of adhesions  . Sinoatrial node dysfunction (HCC)   . Skin cancer    "left  ear; face" (02/28/2018)  . Ventricular fibrillation ([place]) 05/2012   . in setting of AL STEMI 12/13 => EF 20-25% => d/c on LifeVest (repeat echo planned in 08/2012)    Current Outpatient Medications  Medication Sig Dispense Refill  . apixaban (ELIQUIS) 2.5 MG TABS tablet Take 1 tablet (2.5 mg total) by mouth 2 (two) times daily. 180 tablet 1  . atorvastatin (LIPITOR) 20 MG tablet TAKE 1 TABLET DAILY AT 6 PM 90 tablet 2  . cholecalciferol (VITAMIN D) 1000 units tablet Take 1,000 Units by mouth daily.    . cyanocobalamin 1000 MCG tablet Take 1,000 mcg by mouth daily.    Marland Kitchen docusate sodium (COLACE) 100 MG capsule Take 100 mg by mouth 2 (two) times daily.     . feeding supplement, ENSURE ENLIVE, (ENSURE ENLIVE) LIQD Take 237 mLs by mouth 3 (three) times daily between meals. 237 mL 12  . lisinopril (PRINIVIL,ZESTRIL) 2.5 MG tablet Take 1.25 mg by mouth daily.    . Multiple Vitamins-Minerals (ICAPS AREDS 2 PO) Take 1 tablet by mouth 2 (two) times daily.     . nitroGLYCERIN (NITROSTAT) 0.4 MG SL tablet Place 1 tablet (0.4 mg total) under the tongue every 5 (five) minutes x 3 doses as needed for chest pain. Take only as needed for chest pain if your blood pressure is greater than 100/80. 25 tablet 12   No current facility-administered medications for this encounter.     No Known Allergies    Social History   Socioeconomic History  . Marital status: Widowed    Spouse name: Not on file  . Number of children: 1  . Years of education: Not on file  . Highest education level: Not on file  Occupational History  . Occupation: [employer]    Employer: RETIRED  Social Needs  . Financial resource strain: Not on file  . Food insecurity:    Worry: Not on file    Inability: Not on file  . Transportation needs:    Medical: Not on file    Non-medical: Not on file  Tobacco Use  . Smoking status: Former Smoker    Types: Cigarettes  . Smokeless tobacco: Never Used  . Tobacco comment:  09/14/2011; 02/28/2018  "quit smoking when I was ~ 17; probably didn't smoke 10 packs in my life"  Substance and Sexual Activity  . Alcohol use: Never    Frequency: Never  . Drug use: Never  . Sexual activity: Not Currently  Lifestyle  . Physical activity:    Days per week: Not on file    Minutes per session: Not on file  . Stress: Not on file  Relationships  . Social connections:    Talks on phone: Not on file    Gets together: Not on file    Attends religious  service: Not on file    Active member of club or organization: Not on file    Attends meetings of clubs or organizations: Not on file    Relationship status: Not on file  . Intimate partner violence:    Fear of current or ex partner: Not on file    Emotionally abused: Not on file    Physically abused: Not on file    Forced sexual activity: Not on file  Other Topics Concern  . Not on file  Social History Narrative  . Not on file    Family History  Problem Relation Age of Onset  . Heart disease Father   . Other Mother        brain tumor  . Colon cancer Brother   . Cancer Brother        Liver  . Heart disease Brother     Vitals:   04/18/18 1352  BP: 122/68  Pulse: 71  SpO2: 99%  Weight: 60.5 kg (133 lb 6.4 oz)   Wt Readings from Last 3 Encounters:  04/18/18 60.5 kg (133 lb 6.4 oz)  03/15/18 55.9 kg (123 lb 3.2 oz)  03/03/18 52.4 kg (115 lb 9.6 oz)    PHYSICAL EXAM: General:  Appears chronically ill. No resp difficulty HEENT: normal Neck: supple. JVP to jaw. Carotids 2+ bilat; no bruits. No lymphadenopathy or thryomegaly appreciated. Cor: PMI nondisplaced. Regular rate & rhythm. No rubs, gallops or murmurs. Lungs: clear Abdomen: soft, nontender, nondistended. No hepatosplenomegaly. No bruits or masses. Good bowel sounds. Extremities: no cyanosis, clubbing, rash, R and LLE 2-3+ edema Neuro: alert & orientedx3, cranial nerves grossly intact. moves all 4 extremities w/o difficulty. Affect  pleasant  ASSESSMENT & PLAN:  1. Chronic systolic CHF, ICM - Echo 7/34/19 LVEF 15%, Grade 1 DD, Trivial MR, Mildly reduced RV function, PA peak pressure 32 mm Hg.  - s/p St. Jude ICD. He wants to keep tachytherapies on for now. - EF dropped from 20-25% ->15% from 01/2017 - 2019.  - RHC 03/01/18 with low filling pressures and mildly reduced CO. - NYHA III. Volume status trending up. He has been off lasix since September. Will need to give 40- mg po lasix for 2 days then start 40 mg once a week. .   - No BB with for low output.  - Continue Lisinopril 1.25 mg daily for now. Can stop if lightheadedness continues/worsens. - He has declined advanced therapies, including home milrinone.  - He is not a candidate for VAD or transplant. 2. CAD s/p CABG - Most recent cath 01/2017 with patent SVG to diagonal, patent SVG to OM 2, and OM 3. Patent proximal LAD stent. LIMA to LAD and SVG to RCA known occluded.  -no s/s ischemia  - Continue statin.  -BB stopped with concern for low output. Stop ASA now that he is on Eliquis.  3. PAF - On ICD interrogation likely in out of A fib with short runs on interrogation.  - Continue eliquis 2.5 mg BID. Cost is $45/month. He is OK with this.  4. HLD - Continue statin.   Follow up in 8 weeks to reassess volume status. I have asked him to contact Hospice if he develops increased shortness of breath and we could adjust diuretics that way. Check BMET and BNP today.   Discussed device interrogation.    Darrick Grinder, NP 04/18/18

## 2018-05-16 ENCOUNTER — Ambulatory Visit (INDEPENDENT_AMBULATORY_CARE_PROVIDER_SITE_OTHER): Payer: Medicare Other | Admitting: *Deleted

## 2018-05-16 DIAGNOSIS — I469 Cardiac arrest, cause unspecified: Secondary | ICD-10-CM | POA: Diagnosis not present

## 2018-05-16 DIAGNOSIS — Z95810 Presence of automatic (implantable) cardiac defibrillator: Secondary | ICD-10-CM | POA: Diagnosis not present

## 2018-05-16 DIAGNOSIS — I5042 Chronic combined systolic (congestive) and diastolic (congestive) heart failure: Secondary | ICD-10-CM | POA: Diagnosis not present

## 2018-05-16 LAB — CUP PACEART INCLINIC DEVICE CHECK
Brady Statistic RA Percent Paced: 18 %
Brady Statistic RV Percent Paced: 0.62 %
Date Time Interrogation Session: 20191209105000
HighPow Impedance: 57 Ohm
Implantable Lead Implant Date: 20140408
Implantable Lead Location: 753859
Implantable Lead Location: 753860
Implantable Pulse Generator Implant Date: 20140408
Lead Channel Impedance Value: 400 Ohm
Lead Channel Pacing Threshold Amplitude: 0.5 V
Lead Channel Sensing Intrinsic Amplitude: 11.7 mV
Lead Channel Sensing Intrinsic Amplitude: 4.3 mV
Lead Channel Setting Pacing Amplitude: 2 V
Lead Channel Setting Pacing Pulse Width: 0.5 ms
Lead Channel Setting Sensing Sensitivity: 0.5 mV
MDC IDC LEAD IMPLANT DT: 20140408
MDC IDC MSMT BATTERY REMAINING LONGEVITY: 46 mo
MDC IDC MSMT LEADCHNL RA PACING THRESHOLD AMPLITUDE: 0.75 V
MDC IDC MSMT LEADCHNL RA PACING THRESHOLD PULSEWIDTH: 0.5 ms
MDC IDC MSMT LEADCHNL RV IMPEDANCE VALUE: 337.5 Ohm
MDC IDC MSMT LEADCHNL RV PACING THRESHOLD PULSEWIDTH: 0.5 ms
MDC IDC PG SERIAL: 7094248
MDC IDC SET LEADCHNL RV PACING AMPLITUDE: 2.5 V

## 2018-05-16 NOTE — Progress Notes (Signed)
ICD check in clinic. Normal device function. Thresholds and sensing consistent with previous device measurements. Impedance trends stable over time. No evidence of any ventricular arrhythmias. 61 mode switches (<1%)--AT and AF, +Eliquis, longest 6hr 70min. Histogram distribution appropriate for patient and level of activity. Thoracic impedance suggests fluid retention x8 days, patient reports BLE edema, no ShOB, reports hospice is managing his furosemide based on his symptoms. No changes made this session. Device programmed at appropriate safety margins. Device programmed to optimize intrinsic conduction. Estimated longevity 3.2-3.9 years. Patient declines remote monitoring. Patient education completed including shock plan. Alert vibration demonstrated for patient. ROV with DC on 08/15/18 and ROV with AS in 11/2018.

## 2018-05-27 ENCOUNTER — Other Ambulatory Visit: Payer: Self-pay | Admitting: Internal Medicine

## 2018-06-02 ENCOUNTER — Other Ambulatory Visit: Payer: Self-pay | Admitting: Primary Care

## 2018-06-02 DIAGNOSIS — I5084 End stage heart failure: Secondary | ICD-10-CM

## 2018-06-02 DIAGNOSIS — I255 Ischemic cardiomyopathy: Secondary | ICD-10-CM

## 2018-06-02 DIAGNOSIS — Z515 Encounter for palliative care: Secondary | ICD-10-CM

## 2018-06-02 DIAGNOSIS — R0602 Shortness of breath: Secondary | ICD-10-CM

## 2018-06-02 NOTE — Progress Notes (Signed)
[facility] Telephone: [phone] Fax: [phone]  PATIENT NAME: Louis Reid DOB: 1933-03-21 MRN: [id number]  PRIMARY CARE PROVIDER:   [name], MD  REFERRING PROVIDER:  [name], MD [place], [place] [address]  RESPONSIBLE PARTY:   Extended Emergency Contact Information Primary Emergency Contact: Vajda,[name] Address: [address] [place], [place] [address] [place] Mobile Phone: [phone] Relation: Son   ASSESSMENT and RECOMMENDATIONS:   Patient has been recently discharged from hospice due to stability. Palliative care is been asked to consult going forward for her goals of care and sent to management.  1. Cardiac status: Patient is taking 40 mg of furosemide weekly and c/o ongoing edema.  Recommend 20 mg three times a week for edema management and titrate to comfort. Need CMP, BNP, CBC asap as patient has recently been on hospice and most recent labs were 6 weeks ago. Also recommend compression stockings, which patient will obtained  2. Safety : Ongoing safety teaching, monitoring. Has life line button system. Ambulates in home without assistance. Does not use walker or cane to go out. Reports no falls and ambulates with good balance in the home. Endorses driving during daytime.  3. Functionality: Patient states he generally feels well is able to walk to the mailbox without resting although he walks slowly. Patient states he has neighbors friends and his son who help him with bringing in food or any other chores he may need assistance with. (I) with meds, all reviewed and are accurate.   4. Care Coordination:  Palliative education regarding NP  Consultation visits at home and following back up with primary provider in the office for ongoing medical needs. He appreciated the interdisciplinary team approach of Hospice. Wishes to follow up with PCP as provider and with the heart  failure clinic.  5. Goals of Care: Despite having been on hospice patient is unfamiliar with DNR order or MOST form. Will approach this topic for next visit as time has run long discussing patients questions and concerns.Copy of most form left with patient for him to review with his son and consider his choices. Patient has several MD appts and will discuss his care with them.  Community Palliative medicine to follow for symptom management and goals of care clarification. Return 1 month.  I spent 95 minutes providing this consultation,  from 1015 to 1150. More than 50% of the time in this consultation was spent coordinating communication.   HISTORY OF PRESENT ILLNESS:  Louis Reid is a 84 y.o. year old male with multiple medical problems including CAD, cardiomyopathy, unstable angina, HFrEF . Palliative Care was asked to help address goals of care.   CODE STATUS: FULL  PPS: 60% HOSPICE ELIGIBILITY/DIAGNOSIS: no  PAST MEDICAL HISTORY:  Past Medical History:  Diagnosis Date  . AICD (automatic cardioverter/defibrillator) present 09/13/2012  . Anemia   . CAD (coronary artery disease)    a. s/p remote CABG, b. AL STEMI c/b VF arrest in 05/2012 => LHC 05/10/12: Severe 3v CAD, S-OM1/2 ok, SVG-Dx ok, SVG-RCA occluded, LIMA-LAD atretic, proximal LAD occluded. EF 20% with anterior apical HK=> salvage PCI with POBA and thrombectomy of the occluded LAD;  c. 01/2017: atretic LIMA_LAD and known occluded SVG-RCA. Patent LAD stent and 70% proxRCA stenosis (FFR 0.88).   . Chronic systolic heart failure ([place])    a. Echo 12/13: EF 20-25%, anteroseptal, inferior, apical HK,  mildly reduced RV function, trivial pericardial effusion;   b.  Echo 3/14:  Ant, septal, apical AK, EF 25%  . Clotting disorder ([place])   . Depression    "years ago" (02/28/2018)  . History of kidney stones   . HLD (hyperlipidemia)   . Hypertension    "I've never had high blood pressure; on RX for other reasons" (02/28/2018)  . Inguinal  hernia    "have one now on my left side" (09/13/2012); (02/28/2018)  . Ischemic cardiomyopathy   . Myocardial infarction [facility]) 1996;  05/2012  . Osteoporosis   . Paroxysmal atrial fibrillation ([place]) 03/17/2016   noted on device interrogation,  will follow burden  . Pectus excavatum   . Pulmonary embolism ([place])    a. after adhesiolysis for SBO in 04/2012 => coumadin for 6 mos  . Renal cyst   . SBO (small bowel obstruction) [facility]) November 2013   s/p lysis of adhesions  . Sinoatrial node dysfunction ([facility])   . Skin cancer    "left ear; face" (02/28/2018)  . Ventricular fibrillation ([place]) 05/2012   . in setting of AL STEMI 12/13 => EF 20-25% => d/c on LifeVest (repeat echo planned in 08/2012)    SOCIAL HX:  Social History   Tobacco Use  . Smoking status: Former Smoker    Types: Cigarettes  . Smokeless tobacco: Never Used  . Tobacco comment: 09/14/2011; 02/28/2018  "quit smoking when I was ~ 17; probably didn't smoke 10 packs in my life"  Substance Use Topics  . Alcohol use: Never    Frequency: Never    ALLERGIES: No Known Allergies   PERTINENT MEDICATIONS:  Outpatient Encounter Medications as of 06/02/2018  Medication Sig  . apixaban (ELIQUIS) 2.5 MG TABS tablet Take 1 tablet (2.5 mg total) by mouth 2 (two) times daily.  Marland Kitchen atorvastatin (LIPITOR) 20 MG tablet TAKE 1 TABLET DAILY AT 6 PM  . cholecalciferol (VITAMIN D) 1000 units tablet Take 1,000 Units by mouth daily.  . cyanocobalamin 1000 MCG tablet Take 2,000 mcg by mouth daily.   Marland Kitchen docusate sodium (COLACE) 100 MG capsule Take 100 mg by mouth 2 (two) times daily.   . feeding supplement, ENSURE ENLIVE, (ENSURE ENLIVE) LIQD Take 237 mLs by mouth 3 (three) times daily between meals.  . furosemide (LASIX) 40 MG tablet Take 1 tablet (40 mg total) by mouth daily for 2 days, THEN 1 tablet (40 mg total) once a week.  Marland Kitchen lisinopril (PRINIVIL,ZESTRIL) 2.5 MG tablet Take 1.25 mg by mouth daily.  . Multiple Vitamins-Minerals (ICAPS AREDS 2 PO)  Take 1 tablet by mouth 2 (two) times daily.   . nitroGLYCERIN (NITROSTAT) 0.4 MG SL tablet Place 1 tablet (0.4 mg total) under the tongue every 5 (five) minutes x 3 doses as needed for chest pain. Take only as needed for chest pain if your blood pressure is greater than 100/80.   No facility-administered encounter medications on file as of 06/02/2018.     PHYSICAL EXAM:  VS 98.2 -69-18 118/65,  98% RA   Dry weight 118 lbs. But 122 lb today (ht 5'11"),  22 cm= arm circ.  General: NAD, frail appearing, thin Cardiovascular:irregular rate and rhythm, S1S2, endorses ICD Pulmonary: clear all fields no cough, no SOB at rest. Endorses DOE walking to mail box. Abdomen: soft, nontender, + bowel sounds, denies sx constipation GU: endorses adequate flow Extremities: 2+ pre-tibia, 1+ feet edema, no joint deformities. Skin: no rashes, no wounds Neurological: Weakness but otherwise nonfocal , endorses  occ forgetfulness.  Phill Myron. Jackey Loge, AGPCNP-BC

## 2018-06-09 NOTE — Progress Notes (Signed)
Advanced Heart Failure Clinic Note   Referring Physician: Jory Sims, NP PCP: [name], MD PCP-Cardiologist: Kirk Ruths, MD  HF: Dr Haroldine Laws  [facility]  HPI: Louis Reid is a 85 y.o. male with PMH of CAD s/p CABG in 1996 with STEMI in 05/2012 and cath showing patent SVG-OM1-OM2 and SVG-D1 with occluded SVG-RCA and atretic LIMA-LAD with thrombectomy of proximal LAD occlusion, no significant changes on cath in 01/2017), HTN, HLD, ICM (s/p ICD placement in 2014), and chronic combined systolic and diastolic CHF.   Seen in [facility] 01/10/2018 with multiple complaints. This included fatigue and DOE. He had stopped lisinopril due to hypotension.  Repeat echo ordered as below which showed decrease in EF, so referred to HF clinic by Jory Sims for HF evaluation & med optimization.    Admitted from clinic 02/28/18 with concerns for low output HF. RHC as below showed low filling pressures and mildly reduced CO. Given gentle IVF. Palliative consulted as pt refused milrinone. Ultimately, decided for home with hospice with ICD on for now. Started on Eliquis for new Afib.   He returns today for HF follow up. Last visit he was started on weekly lasix. He is followed by [place] and had lasix recently increased from 40 mg weekly to 20 mg 3x/week. Overall feels fine. Denies SOB, orthopnea, or PND. He has some ankle edema after standing or sitting with his legs down. He is not very active. No dizziness or CP. Denies bleeding on Eliquis. Appetite is okay. He is concerned that food doesn't taste as good as it used to. He is now followed by Palliative and has graduated from Hospice. Taking all medications. Limits fluid and salt. His PCP is checking his bloodwork q3 months and checked last week.  Corvue: thoracic impedence at threshold. No VT/VF. No AF/AT.   [place] 03/01/18: RA = 2 RV = 25/1 PA = 20/2 (9) PCW = 1 Fick cardiac output/index = 4.1/2.5 PVR =  2.0 WU FA sat = 98% PA sat = 70%, 72% SVC sat = 72%  Echo 01/17/18 LVEF 15%, Grade 1 DD, Trivial MR, Mildly reduced RV function, PA peak pressure 32 mm Hg.   Echo 01/20/2017 LVEF 20-25%, Grade 1 DD, Mildly reduced RV function  LHC 01/20/2017  Ost Cx to Prox Cx lesion, 80 %stenosed.  Prox Cx to Mid Cx lesion, 100 %stenosed.  Prox LAD to Dist LAD lesion, 20 %stenosed.  Dist LAD lesion, 30 %stenosed.  Prox RCA lesion, 70 %stenosed.  Prox RCA to Mid RCA lesion, 30 %stenosed.  Ost 2nd Diag to 2nd Diag lesion, 100 %stenosed.  SVG.  The graft exhibits mild .  SVG.  Origin to Prox Graft lesion, 30 %stenosed.  Mid Graft lesion, 20 %stenosed.  SVG graft was not injected.  Origin lesion, 100 %stenosed.  Review of systems complete and found to be negative unless listed in HPI.   Past Medical History:  Diagnosis Date  . AICD (automatic cardioverter/defibrillator) present 09/13/2012  . Anemia   . CAD (coronary artery disease)    a. s/p remote CABG, b. AL STEMI c/b VF arrest in 05/2012 => LHC 05/10/12: Severe 3v CAD, S-OM1/2 ok, SVG-Dx ok, SVG-RCA occluded, LIMA-LAD atretic, proximal LAD occluded. EF 20% with anterior apical HK=> salvage PCI with POBA and thrombectomy of the occluded LAD;  c. 01/2017: atretic LIMA_LAD and known occluded SVG-RCA. Patent LAD stent and 70% proxRCA stenosis (FFR 0.88).   . Chronic systolic heart failure ([place])  a. Echo 12/13: EF 20-25%, anteroseptal, inferior, apical HK, mildly reduced RV function, trivial pericardial effusion;   b.  Echo 3/14:  Ant, septal, apical AK, EF 25%  . Clotting disorder ([place])   . Depression    "years ago" (02/28/2018)  . History of kidney stones   . HLD (hyperlipidemia)   . Hypertension    "I've never had high blood pressure; on RX for other reasons" (02/28/2018)  . Inguinal hernia    "have one now on my left side" (09/13/2012); (02/28/2018)  . Ischemic cardiomyopathy   . Myocardial infarction [facility]) 1996;  05/2012  .  Osteoporosis   . Paroxysmal atrial fibrillation ([place]) 03/17/2016   noted on device interrogation,  will follow burden  . Pectus excavatum   . Pulmonary embolism ([place])    a. after adhesiolysis for SBO in 04/2012 => coumadin for 6 mos  . Renal cyst   . SBO (small bowel obstruction) [facility]) November 2013   s/p lysis of adhesions  . Sinoatrial node dysfunction (HCC)   . Skin cancer    "left ear; face" (02/28/2018)  . Ventricular fibrillation ([place]) 05/2012   . in setting of AL STEMI 12/13 => EF 20-25% => d/c on LifeVest (repeat echo planned in 08/2012)    Current Outpatient Medications  Medication Sig Dispense Refill  . apixaban (ELIQUIS) 2.5 MG TABS tablet Take 1 tablet (2.5 mg total) by mouth 2 (two) times daily. 180 tablet 1  . atorvastatin (LIPITOR) 20 MG tablet TAKE 1 TABLET DAILY AT 6 PM 90 tablet 2  . cholecalciferol (VITAMIN D) 1000 units tablet Take 1,000 Units by mouth daily.    . cyanocobalamin 1000 MCG tablet Take 2,000 mcg by mouth daily.     Marland Kitchen docusate sodium (COLACE) 100 MG capsule Take 100 mg by mouth 2 (two) times daily.     . feeding supplement, ENSURE ENLIVE, (ENSURE ENLIVE) LIQD Take 237 mLs by mouth 3 (three) times daily between meals. 237 mL 12  . furosemide (LASIX) 40 MG tablet Take 20 mg by mouth 3 (three) times a week. Mon  Wed  Fri    . lisinopril (PRINIVIL,ZESTRIL) 2.5 MG tablet Take 1.25 mg by mouth daily.    . Multiple Vitamins-Minerals (ICAPS AREDS 2 PO) Take 1 tablet by mouth 2 (two) times daily.     . nitroGLYCERIN (NITROSTAT) 0.4 MG SL tablet Place 1 tablet (0.4 mg total) under the tongue every 5 (five) minutes x 3 doses as needed for chest pain. Take only as needed for chest pain if your blood pressure is greater than 100/80. 25 tablet 12   No current facility-administered medications for this encounter.     No Known Allergies    Social History   Socioeconomic History  . Marital status: Widowed    Spouse name: Not on file  . Number of children: 1  .  Years of education: Not on file  . Highest education level: Not on file  Occupational History  . Occupation: [name]    Employer: RETIRED  Social Needs  . Financial resource strain: Not on file  . Food insecurity:    Worry: Not on file    Inability: Not on file  . Transportation needs:    Medical: Not on file    Non-medical: Not on file  Tobacco Use  . Smoking status: Former Smoker    Types: Cigarettes  . Smokeless tobacco: Never Used  . Tobacco comment: 09/14/2011; 02/28/2018  "quit smoking when I  was ~ 17; probably didn't smoke 10 packs in my life"  Substance and Sexual Activity  . Alcohol use: Never    Frequency: Never  . Drug use: Never  . Sexual activity: Not Currently  Lifestyle  . Physical activity:    Days per week: Not on file    Minutes per session: Not on file  . Stress: Not on file  Relationships  . Social connections:    Talks on phone: Not on file    Gets together: Not on file    Attends religious service: Not on file    Active member of club or organization: Not on file    Attends meetings of clubs or organizations: Not on file    Relationship status: Not on file  . Intimate partner violence:    Fear of current or ex partner: Not on file    Emotionally abused: Not on file    Physically abused: Not on file    Forced sexual activity: Not on file  Other Topics Concern  . Not on file  Social History Narrative  . Not on file    Family History  Problem Relation Age of Onset  . Heart disease Father   . Other Mother        brain tumor  . Colon cancer Brother   . Cancer Brother        Liver  . Heart disease Brother     Vitals:   06/13/18 0908  BP: 120/78  Pulse: 86  SpO2: 99%  Weight: 60.2 kg (132 lb 12.8 oz)   Wt Readings from Last 3 Encounters:  06/13/18 60.2 kg (132 lb 12.8 oz)  06/02/18 55.3 kg (122 lb)  04/18/18 60.5 kg (133 lb 6.4 oz)    PHYSICAL EXAM: General: Appears chronically ill. No resp difficulty. HEENT:  Normal Neck: Supple. JVP 5-6. Carotids 2+ bilat; no bruits. No thyromegaly or nodule noted. Cor: PMI nondisplaced. RRR, No M/G/R noted Lungs: CTAB, normal effort. Abdomen: Soft, non-tender, non-distended, no HSM. No bruits or masses. +BS  Extremities: No cyanosis, clubbing, or rash. R and LLE 1+ ankle edema.  Neuro: Alert & orientedx3, cranial nerves grossly intact. moves all 4 extremities w/o difficulty. Affect pleasant   ASSESSMENT & PLAN:  1. Chronic systolic CHF, ICM - Echo 11/21/05 LVEF 15%, Grade 1 DD, Trivial MR, Mildly reduced RV function, PA peak pressure 32 mm Hg.  - s/p St. Jude ICD. He wants to keep tachytherapies on for now. - EF dropped from 20-25% ->15% from 01/2017 - 2019.  - RHC 03/01/18 with low filling pressures and mildly reduced CO. - NYHA III. Not very active.  - Volume status stable on exam and on corvue. Continue lasix 20 mg 3x/week.  - No BB with for low output.  - Continue Lisinopril 1.25 mg daily for now. No further lightheadedness.  - He has declined advanced therapies, including home milrinone.  - He is not a candidate for VAD or transplant. - Followed by Hospice of  > now graduated to palliative.  - Provided prescription for TED hose. - Reviewed lab results from PCP. Creatinine stable 1.33, K 4.4 on 06/07/18. 2. CAD s/p CABG - Most recent cath 01/2017 with patent SVG to diagonal, patent SVG to OM 2, and OM 3. Patent proximal LAD stent. LIMA to LAD and SVG to RCA known occluded.  - No s/s ischemia.  - Continue statin.  - BB stopped with concern for low output. No ASA now that he is  on Eliquis.  3. PAF - None on ICD interrogation. - Continue eliquis 2.5 mg BID. Cost is $45/month. He is OK with this.  4. HLD - Continue statin. No change.   Doing well. Follow up in 3 months  Georgiana Shore, NP 06/13/18    Greater than 50% of the 25 minute visit was spent in counseling/coordination of care regarding disease state education, salt/fluid  restriction, sliding scale diuretics, and medication compliance.

## 2018-06-12 ENCOUNTER — Other Ambulatory Visit: Payer: Self-pay | Admitting: Cardiology

## 2018-06-13 ENCOUNTER — Encounter (HOSPITAL_COMMUNITY): Payer: Self-pay

## 2018-06-13 ENCOUNTER — Ambulatory Visit (HOSPITAL_COMMUNITY)
Admission: RE | Admit: 2018-06-13 | Discharge: 2018-06-13 | Disposition: A | Discharge status: Home / Self Care | Payer: Medicare Other | Source: Ambulatory Visit | Attending: Internal Medicine | Admitting: Internal Medicine

## 2018-06-13 VITALS — BP 120/78 | HR 86 | Wt 132.8 lb

## 2018-06-13 DIAGNOSIS — Z86711 Personal history of pulmonary embolism: Secondary | ICD-10-CM | POA: Diagnosis not present

## 2018-06-13 DIAGNOSIS — Z87891 Personal history of nicotine dependence: Secondary | ICD-10-CM | POA: Insufficient documentation

## 2018-06-13 DIAGNOSIS — Z955 Presence of coronary angioplasty implant and graft: Secondary | ICD-10-CM | POA: Insufficient documentation

## 2018-06-13 DIAGNOSIS — I480 Paroxysmal atrial fibrillation: Secondary | ICD-10-CM | POA: Diagnosis not present

## 2018-06-13 DIAGNOSIS — Z8674 Personal history of sudden cardiac arrest: Secondary | ICD-10-CM | POA: Diagnosis not present

## 2018-06-13 DIAGNOSIS — Z79899 Other long term (current) drug therapy: Secondary | ICD-10-CM | POA: Insufficient documentation

## 2018-06-13 DIAGNOSIS — I5022 Chronic systolic (congestive) heart failure: Secondary | ICD-10-CM | POA: Diagnosis not present

## 2018-06-13 DIAGNOSIS — I5042 Chronic combined systolic (congestive) and diastolic (congestive) heart failure: Secondary | ICD-10-CM | POA: Insufficient documentation

## 2018-06-13 DIAGNOSIS — I255 Ischemic cardiomyopathy: Secondary | ICD-10-CM | POA: Diagnosis not present

## 2018-06-13 DIAGNOSIS — I252 Old myocardial infarction: Secondary | ICD-10-CM | POA: Insufficient documentation

## 2018-06-13 DIAGNOSIS — Z7901 Long term (current) use of anticoagulants: Secondary | ICD-10-CM | POA: Diagnosis not present

## 2018-06-13 DIAGNOSIS — I2510 Atherosclerotic heart disease of native coronary artery without angina pectoris: Secondary | ICD-10-CM | POA: Diagnosis not present

## 2018-06-13 DIAGNOSIS — I25810 Atherosclerosis of coronary artery bypass graft(s) without angina pectoris: Secondary | ICD-10-CM | POA: Insufficient documentation

## 2018-06-13 DIAGNOSIS — I110 Hypertensive heart disease with heart failure: Secondary | ICD-10-CM | POA: Diagnosis not present

## 2018-06-13 DIAGNOSIS — Z8249 Family history of ischemic heart disease and other diseases of the circulatory system: Secondary | ICD-10-CM | POA: Diagnosis not present

## 2018-06-13 DIAGNOSIS — Z87442 Personal history of urinary calculi: Secondary | ICD-10-CM | POA: Insufficient documentation

## 2018-06-13 DIAGNOSIS — E785 Hyperlipidemia, unspecified: Secondary | ICD-10-CM

## 2018-06-13 DIAGNOSIS — Z85828 Personal history of other malignant neoplasm of skin: Secondary | ICD-10-CM | POA: Diagnosis not present

## 2018-06-13 DIAGNOSIS — Z95810 Presence of automatic (implantable) cardiac defibrillator: Secondary | ICD-10-CM | POA: Diagnosis not present

## 2018-06-13 NOTE — Patient Instructions (Signed)
You have been given a prescription for Compression stockings. Please wear during the day  Your physician recommends that you schedule a follow-up appointment in: 2-3 months with NP/PA clinic

## 2018-06-14 ENCOUNTER — Other Ambulatory Visit (HOSPITAL_COMMUNITY): Payer: Self-pay | Admitting: Internal Medicine

## 2018-06-14 NOTE — Telephone Encounter (Signed)
Rx request sent to pharmacy.  

## 2018-06-29 ENCOUNTER — Telehealth: Payer: Self-pay

## 2018-06-29 NOTE — Telephone Encounter (Signed)
Phone call placed to patient to offer to schedule a follow up visit with Palliative Care. Phone rang with no answer or VM set up

## 2018-07-15 ENCOUNTER — Other Ambulatory Visit: Payer: Medicare Other | Admitting: Primary Care

## 2018-07-15 DIAGNOSIS — R0602 Shortness of breath: Secondary | ICD-10-CM

## 2018-07-15 DIAGNOSIS — Z515 Encounter for palliative care: Secondary | ICD-10-CM

## 2018-07-15 DIAGNOSIS — I5084 End stage heart failure: Secondary | ICD-10-CM

## 2018-07-15 NOTE — Progress Notes (Signed)
[facility] Consult Note Telephone: [phone]  Fax: [phone]  PATIENT NAME: Louis Reid DOB: 1932/10/30 MRN: [id number]  PRIMARY CARE PROVIDER:   [name], MD  REFERRING PROVIDER:  [name], MD [place], [place] [address]  RESPONSIBLE PARTY:   Extended Emergency Contact Information Primary Emergency Contact: Herard,[name] Address: [address] [place], [place] [address] [place] Mobile Phone: [phone] Relation: Son   ASSESSMENT AND RECOMMENDATIONS:   1. Edema: He will get compression stockings today, prescribed at cardiology appt a month ago. Still has edema in bil LE, 2+ to knees. He is distressed about this. He has a prescription for compression stockings and says he's been to multiple outlets. There is  Business card for a medical supply in [place]; we called and they have plenty of his size in a generic brand for $25/ pair. He states he will go over and get a pair today, encouraged to buy 2 pairs but at last he can start with one. He may also want to invest in a sock butler. He also stopped weighing daily and has gained to 130 lbs, up 6 pounds. Instructed to begin daily weights and a log for edema, and will revisit diuretic after he initiates compression.   2. Nutrition:  Complains taste is decreasing, taking B12 and also is considering zinc.Feels he is gaining weight but stopped weighing for CHF a month ago. Now weighs 130 ( up from 124) but has significant edema. Arm circ also measured today. Gets food from friends/family. Discussed label reading and sodium. Not sure he understands counting 1000 -2000 mg / day of sodium intake. He states he does not add salt. Reiterated need to look for SODIUM, not just sprinkled salt.He gets a lot of his diet from friends who cook for him.   3. Depression: States he is worried about his son.  Consider  antidepressant e.g zoloft 25 mg.He (son) has lost his job, and he can't visit as much. He realizes this stress is bad for his health. States he has a Environmental education officer who visits often.   4. Caregiver needs: Living alone, wife has passed away. States he can cope at home, has a few outlets but misses seeing his son. He speaks of assisted living which he is not at that level of care, but has long term care insurance to help with that expense. Does not want to consider an independent living situation however.   5. Goals of Care: MOST given again, will complete on next visit. States he does not like to think bout his demise. Education provided that this form allows caregivers or EMS to know what he wants, and act accordingly, that it is a form to help him make his own decisions. This explanation seems  To encourage him and he will look it over for next visit.   [facility] Pall[facility] medicine to follow for symptom management and goals of care clarification. Return 1 month.   I spent 95  minutes providing this consultation,  from 0910 to 1045. More than 50% of the time in this consultation was spent coordinating communication.   HISTORY OF PRESENT ILLNESS:  Louis Reid is a 85 y.o. year old male with multiple medical problems including CAD, cardiomyopathy, unstable angina, HFrEF, edema. Palliative Care was asked to help address goals of care.   CODE STATUS: FULL  PPS: 60% HOSPICE ELIGIBILITY/DIAGNOSIS: TBD  PAST MEDICAL HISTORY:  Past Medical History:  Diagnosis Date  . AICD (automatic cardioverter/defibrillator) present 09/13/2012  . Anemia   . CAD (coronary artery disease)    a. s/p remote CABG, b. AL STEMI c/b VF arrest in 05/2012 => LHC 05/10/12: Severe 3v CAD, S-OM1/2 ok, SVG-Dx ok, SVG-RCA occluded, LIMA-LAD atretic, proximal LAD occluded. EF 20% with anterior apical HK=> salvage PCI with POBA and thrombectomy of the occluded LAD;  c. 01/2017: atretic LIMA_LAD and known occluded  SVG-RCA. Patent LAD stent and 70% proxRCA stenosis (FFR 0.88).   . Chronic systolic heart failure ([place])    a. Echo 12/13: EF 20-25%, anteroseptal, inferior, apical HK, mildly reduced RV function, trivial pericardial effusion;   b.  Echo 3/14:  Ant, septal, apical AK, EF 25%  . Clotting disorder ([place])   . Depression    "years ago" (02/28/2018)  . History of kidney stones   . HLD (hyperlipidemia)   . Hypertension    "I've never had high blood pressure; on RX for other reasons" (02/28/2018)  . Inguinal hernia    "have one now on my left side" (09/13/2012); (02/28/2018)  . Ischemic cardiomyopathy   . Myocardial infarction [facility]) 1996;  05/2012  . Osteoporosis   . Paroxysmal atrial fibrillation ([place]) 03/17/2016   noted on device interrogation,  will follow burden  . Pectus excavatum   . Pulmonary embolism ([place])    a. after adhesiolysis for SBO in 04/2012 => coumadin for 6 mos  . Renal cyst   . SBO (small bowel obstruction) [facility]) November 2013   s/p lysis of adhesions  . Sinoatrial node dysfunction (HCC)   . Skin cancer    "left ear; face" (02/28/2018)  . Ventricular fibrillation ([place]) 05/2012   . in setting of AL STEMI 12/13 => EF 20-25% => d/c on LifeVest (repeat echo planned in 08/2012)    SOCIAL HX:  Social History   Tobacco Use  . Smoking status: Former Smoker    Types: Cigarettes  . Smokeless tobacco: Never Used  . Tobacco comment: 09/14/2011; 02/28/2018  "quit smoking when I was ~ 17; probably didn't smoke 10 packs in my life"  Substance Use Topics  . Alcohol use: Never    Frequency: Never    ALLERGIES: No Known Allergies   PERTINENT MEDICATIONS:  Outpatient Encounter Medications as of 07/15/2018  Medication Sig  . apixaban (ELIQUIS) 2.5 MG TABS tablet Take 1 tablet (2.5 mg total) by mouth 2 (two) times daily.  Marland Kitchen atorvastatin (LIPITOR) 20 MG tablet TAKE 1 TABLET BY MOUTH  DAILY AT 6 PM  . cholecalciferol (VITAMIN D) 1000 units tablet Take 1,000 Units by mouth daily.  .  cyanocobalamin 1000 MCG tablet Take 2,000 mcg by mouth daily.   Marland Kitchen docusate sodium (COLACE) 100 MG capsule Take 100 mg by mouth 2 (two) times daily.   . feeding supplement, ENSURE ENLIVE, (ENSURE ENLIVE) LIQD Take 237 mLs by mouth 3 (three) times daily between meals.  . furosemide (LASIX) 40 MG tablet Take 20 mg by mouth 3 (three) times a week. Mon  Wed  Fri  . lisinopril (PRINIVIL,ZESTRIL) 2.5 MG tablet Take 1.25 mg by mouth daily.  . Multiple Vitamins-Minerals (ICAPS AREDS 2 PO) Take 1 tablet by mouth 2 (two) times daily.   . nitroGLYCERIN (NITROSTAT) 0.4 MG SL tablet Place 1 tablet (0.4 mg total) under the tongue every 5 (five) minutes x 3 doses as needed for chest pain. Take only as needed  for chest pain if your blood pressure is greater than 100/80.   No facility-administered encounter medications on file as of 07/15/2018.     PHYSICAL EXAM:  VS 98% RA 97.1-66-18  122/72 weight 130 lb, weighs each hs General: NAD, frail appearing, thin Cardiovascular: irregular rate and rhythm, has defibrillator, S1 S2, ambulates well on flat terrain, but inclines are difficult.  Pulmonary: clear all fields, no cough Abdomen: soft, nontender, + bowel sounds Extremities: 1-2+  edema, no joint deformities Skin: no rashes , no wounds Neurological: Weakness but otherwise nonfocal, memory intact.   Cyndia Skeeters DNP AGPCNP-BC

## 2018-07-27 ENCOUNTER — Encounter: Payer: Self-pay | Admitting: Internal Medicine

## 2018-07-28 ENCOUNTER — Telehealth: Payer: Self-pay | Admitting: Primary Care

## 2018-07-28 NOTE — Telephone Encounter (Signed)
T/c from Patient concerned about increased edema in LE. He states this is uncomfortable and makes him more fall prone. He was recently increased from 40 mg weekly of lasix to 20 mg 3 / week. He still has edema. I instructed him to increase lasix to 20 mg daily and then get in touch with PCP for continued follow up on Monday. He voiced understanding at these instructions. Pt. Reports potassium this week was 4.8. Please schedule patient for PCP to continue to monitor and titrate diuretic in the local office. Louis Reid understands that follow up with be with PCP office.

## 2018-08-08 ENCOUNTER — Ambulatory Visit (HOSPITAL_COMMUNITY)
Admission: RE | Admit: 2018-08-08 | Discharge: 2018-08-08 | Disposition: A | Discharge status: Home / Self Care | Payer: Medicare Other | Source: Ambulatory Visit | Attending: Internal Medicine | Admitting: Internal Medicine

## 2018-08-08 ENCOUNTER — Other Ambulatory Visit: Payer: Self-pay

## 2018-08-08 VITALS — BP 122/88 | HR 73 | Wt 137.0 lb

## 2018-08-08 DIAGNOSIS — I2510 Atherosclerotic heart disease of native coronary artery without angina pectoris: Secondary | ICD-10-CM | POA: Insufficient documentation

## 2018-08-08 DIAGNOSIS — Z955 Presence of coronary angioplasty implant and graft: Secondary | ICD-10-CM | POA: Insufficient documentation

## 2018-08-08 DIAGNOSIS — Z86711 Personal history of pulmonary embolism: Secondary | ICD-10-CM | POA: Insufficient documentation

## 2018-08-08 DIAGNOSIS — Z87891 Personal history of nicotine dependence: Secondary | ICD-10-CM | POA: Diagnosis not present

## 2018-08-08 DIAGNOSIS — I471 Supraventricular tachycardia: Secondary | ICD-10-CM | POA: Insufficient documentation

## 2018-08-08 DIAGNOSIS — I5022 Chronic systolic (congestive) heart failure: Secondary | ICD-10-CM

## 2018-08-08 DIAGNOSIS — E785 Hyperlipidemia, unspecified: Secondary | ICD-10-CM | POA: Insufficient documentation

## 2018-08-08 DIAGNOSIS — I480 Paroxysmal atrial fibrillation: Secondary | ICD-10-CM | POA: Diagnosis not present

## 2018-08-08 DIAGNOSIS — I10 Essential (primary) hypertension: Secondary | ICD-10-CM

## 2018-08-08 DIAGNOSIS — Z8674 Personal history of sudden cardiac arrest: Secondary | ICD-10-CM | POA: Diagnosis not present

## 2018-08-08 DIAGNOSIS — Z87442 Personal history of urinary calculi: Secondary | ICD-10-CM | POA: Diagnosis not present

## 2018-08-08 DIAGNOSIS — Z79899 Other long term (current) drug therapy: Secondary | ICD-10-CM | POA: Diagnosis not present

## 2018-08-08 DIAGNOSIS — Z95810 Presence of automatic (implantable) cardiac defibrillator: Secondary | ICD-10-CM | POA: Diagnosis not present

## 2018-08-08 DIAGNOSIS — Z85828 Personal history of other malignant neoplasm of skin: Secondary | ICD-10-CM | POA: Insufficient documentation

## 2018-08-08 DIAGNOSIS — I25810 Atherosclerosis of coronary artery bypass graft(s) without angina pectoris: Secondary | ICD-10-CM | POA: Diagnosis not present

## 2018-08-08 DIAGNOSIS — I5042 Chronic combined systolic (congestive) and diastolic (congestive) heart failure: Secondary | ICD-10-CM | POA: Diagnosis not present

## 2018-08-08 DIAGNOSIS — I110 Hypertensive heart disease with heart failure: Secondary | ICD-10-CM | POA: Diagnosis not present

## 2018-08-08 DIAGNOSIS — Z8249 Family history of ischemic heart disease and other diseases of the circulatory system: Secondary | ICD-10-CM | POA: Diagnosis not present

## 2018-08-08 DIAGNOSIS — I252 Old myocardial infarction: Secondary | ICD-10-CM | POA: Insufficient documentation

## 2018-08-08 DIAGNOSIS — I255 Ischemic cardiomyopathy: Secondary | ICD-10-CM | POA: Insufficient documentation

## 2018-08-08 DIAGNOSIS — Z7901 Long term (current) use of anticoagulants: Secondary | ICD-10-CM | POA: Insufficient documentation

## 2018-08-08 NOTE — Patient Instructions (Signed)
No medication changes today!!  Your physician recommends that you schedule a follow-up appointment in: 3 months with Dr. Haroldine Laws

## 2018-08-08 NOTE — Progress Notes (Signed)
Advanced Heart Failure Clinic Note   Referring Physician: Jory Sims, NP PCP: [name], MD PCP-Cardiologist: No primary care provider on file.  HF: Dr Haroldine Laws  [facility]  HPI: Louis Reid is a 85 y.o. male with PMH of CAD s/p CABG in 1996 with STEMI in 05/2012 and cath showing patent SVG-OM1-OM2 and SVG-D1 with occluded SVG-RCA and atretic LIMA-LAD with thrombectomy of proximal LAD occlusion, no significant changes on cath in 01/2017), HTN, HLD, ICM (s/p ICD placement in 2014), and chronic combined systolic and diastolic CHF.   Seen in [facility] clinic 01/10/2018 with multiple complaints. This included fatigue and DOE. He had stopped lisinopril due to hypotension.  Repeat echo ordered as below which showed decrease in EF, so referred to HF clinic by Jory Sims for HF evaluation & med optimization.    Admitted from clinic 02/28/18 with concerns for low output HF. RHC as below showed low filling pressures and mildly reduced CO. Given gentle IVF. Palliative consulted as pt refused milrinone. Ultimately, decided for home with hospice with ICD on for now. Started on Eliquis for new Afib.   He presents today for regular follow up. No change from last visit with low He has been doing OK overall. Over the past month or two, his lasix has been titrated up. He went from 40 3 times a week, to 20 mg daily. He feels better on this. PCP is managing.  He continues with palliative care after graduating hospice. Working closely with PCP. Lasix increased to 20 mg daily last week. He denies CP, lightheadedness or dizziness. No bleeding on Eliquis. Appetite is stable, but has diminished taste chronically. Watching slat and fluid intake. Cr 1.22 at PCP office last week.   [place]: Thoracic impedence above threshold. AF burden < 1%. Occasional SVT. Pt activity > 4 hours daily. Personally reviewed.   [place] 03/01/18: RA = 2 RV = 25/1 PA = 20/2 (9) PCW = 1 Fick cardiac output/index =  4.1/2.5 PVR = 2.0 WU FA sat = 98% PA sat = 70%, 72% SVC sat = 72%  Echo 01/17/18 LVEF 15%, Grade 1 DD, Trivial MR, Mildly reduced RV function, PA peak pressure 32 mm Hg.   Echo 01/20/2017 LVEF 20-25%, Grade 1 DD, Mildly reduced RV function  LHC 01/20/2017  Ost Cx to Prox Cx lesion, 80 %stenosed.  Prox Cx to Mid Cx lesion, 100 %stenosed.  Prox LAD to Dist LAD lesion, 20 %stenosed.  Dist LAD lesion, 30 %stenosed.  Prox RCA lesion, 70 %stenosed.  Prox RCA to Mid RCA lesion, 30 %stenosed.  Ost 2nd Diag to 2nd Diag lesion, 100 %stenosed.  SVG.  The graft exhibits mild .  SVG.  Origin to Prox Graft lesion, 30 %stenosed.  Mid Graft lesion, 20 %stenosed.  SVG graft was not injected.  Origin lesion, 100 %stenosed.  Review of systems complete and found to be negative unless listed in HPI.    Past Medical History:  Diagnosis Date  . AICD (automatic cardioverter/defibrillator) present 09/13/2012  . Anemia   . CAD (coronary artery disease)    a. s/p remote CABG, b. AL STEMI Reid/b VF arrest in 05/2012 => LHC 05/10/12: Severe 3v CAD, S-OM1/2 ok, SVG-Dx ok, SVG-RCA occluded, LIMA-LAD atretic, proximal LAD occluded. EF 20% with anterior apical HK=> salvage PCI with POBA and thrombectomy of the occluded LAD;  Reid. 01/2017: atretic LIMA_LAD and known occluded SVG-RCA. Patent LAD stent and 70% proxRCA stenosis (FFR 0.88).   . Chronic systolic heart failure (  [place])    a. Echo 12/13: EF 20-25%, anteroseptal, inferior, apical HK, mildly reduced RV function, trivial pericardial effusion;   b.  Echo 3/14:  Ant, septal, apical AK, EF 25%  . Clotting disorder ([place])   . Depression    "years ago" (02/28/2018)  . History of kidney stones   . HLD (hyperlipidemia)   . Hypertension    "I've never had high blood pressure; on RX for other reasons" (02/28/2018)  . Inguinal hernia    "have one now on my left side" (09/13/2012); (02/28/2018)  . Ischemic cardiomyopathy   . Myocardial infarction [facility]) 1996;   05/2012  . Osteoporosis   . Paroxysmal atrial fibrillation ([place]) 03/17/2016   noted on device interrogation,  will follow burden  . Pectus excavatum   . Pulmonary embolism ([place])    a. after adhesiolysis for SBO in 04/2012 => coumadin for 6 mos  . Renal cyst   . SBO (small bowel obstruction) [facility]) November 2013   s/p lysis of adhesions  . Sinoatrial node dysfunction (HCC)   . Skin cancer    "left ear; face" (02/28/2018)  . Ventricular fibrillation ([place]) 05/2012   . in setting of AL STEMI 12/13 => EF 20-25% => d/Reid on LifeVest (repeat echo planned in 08/2012)    Current Outpatient Medications  Medication Sig Dispense Refill  . apixaban (ELIQUIS) 2.5 MG TABS tablet Take 1 tablet (2.5 mg total) by mouth 2 (two) times daily. 180 tablet 1  . atorvastatin (LIPITOR) 20 MG tablet TAKE 1 TABLET BY MOUTH  DAILY AT 6 PM 90 tablet 2  . cholecalciferol (VITAMIN D) 1000 units tablet Take 1,000 Units by mouth daily.    . cyanocobalamin 1000 MCG tablet Take 2,000 mcg by mouth daily.     Marland Kitchen docusate sodium (COLACE) 100 MG capsule Take 100 mg by mouth 2 (two) times daily.     . feeding supplement, ENSURE ENLIVE, (ENSURE ENLIVE) LIQD Take 237 mLs by mouth 3 (three) times daily between meals. 237 mL 12  . furosemide (LASIX) 40 MG tablet Take 20 mg by mouth daily.     Marland Kitchen lisinopril (PRINIVIL,ZESTRIL) 2.5 MG tablet Take 1.25 mg by mouth daily.    . Multiple Vitamins-Minerals (ICAPS AREDS 2 PO) Take 1 tablet by mouth 2 (two) times daily.     . nitroGLYCERIN (NITROSTAT) 0.4 MG SL tablet Place 1 tablet (0.4 mg total) under the tongue every 5 (five) minutes x 3 doses as needed for chest pain. Take only as needed for chest pain if your blood pressure is greater than 100/80. 25 tablet 12   No current facility-administered medications for this encounter.    No Known Allergies  Social History   Socioeconomic History  . Marital status: Widowed    Spouse name: Not on file  . Number of children: 1  . Years of  education: Not on file  . Highest education level: Not on file  Occupational History  . Occupation: Louis Reid    Employer: RETIRED  Social Needs  . Financial resource strain: Not on file  . Food insecurity:    Worry: Not on file    Inability: Not on file  . Transportation needs:    Medical: Not on file    Non-medical: Not on file  Tobacco Use  . Smoking status: Former Smoker    Types: Cigarettes  . Smokeless tobacco: Never Used  . Tobacco comment: 09/14/2011; 02/28/2018  "quit smoking when I was ~ 17; probably  didn't smoke 10 packs in my life"  Substance and Sexual Activity  . Alcohol use: Never    Frequency: Never  . Drug use: Never  . Sexual activity: Not Currently  Lifestyle  . Physical activity:    Days per week: Not on file    Minutes per session: Not on file  . Stress: Not on file  Relationships  . Social connections:    Talks on phone: Not on file    Gets together: Not on file    Attends religious service: Not on file    Active member of club or organization: Not on file    Attends meetings of clubs or organizations: Not on file    Relationship status: Not on file  . Intimate partner violence:    Fear of current or ex partner: Not on file    Emotionally abused: Not on file    Physically abused: Not on file    Forced sexual activity: Not on file  Other Topics Concern  . Not on file  Social History Narrative  . Not on file    Family History  Problem Relation Age of Onset  . Heart disease Father   . Other Mother        brain tumor  . Colon cancer Brother   . Cancer Brother        Liver  . Heart disease Brother     Vitals:   08/08/18 0939  BP: 122/88  Pulse: 73  SpO2: 98%  Weight: 62.1 kg (137 lb)   Wt Readings from Last 3 Encounters:  08/08/18 62.1 kg (137 lb)  06/13/18 60.2 kg (132 lb 12.8 oz)  06/02/18 55.3 kg (122 lb)    PHYSICAL EXAM: General: Elderly appearing. No resp difficulty. HEENT: Normal Neck: Supple. JVP 5-6. Carotids  2+ bilat; no bruits. No thyromegaly or nodule noted. Cor: PMI nondisplaced. RRR, No M/G/R noted Lungs: CTAB, normal effort. Abdomen: Soft, non-tender, non-distended, no HSM. No bruits or masses. +BS  Extremities: No cyanosis, clubbing, or rash. Trace to 1+ edema above socks.  Neuro: Alert & orientedx3, cranial nerves grossly intact. moves all 4 extremities w/o difficulty. Affect pleasant   ASSESSMENT & PLAN:  1. Chronic systolic CHF, ICM - Echo 9/35/70 LVEF 15%, Grade 1 DD, Trivial MR, Mildly reduced RV function, PA peak pressure 32 mm Hg.  - s/p St. Jude ICD. He wants to keep tachytherapies on for now. - EF dropped from 20-25% ->15% from 01/2017 - 2019.  - RHC 03/01/18 with low filling pressures and mildly reduced CO. - NYHA III symptoms chronically - Volume status stable one exam. Encouraged compression hose.   - Continue lasix 20 mg daily for now.  - No BB with for low output.  - Continue Lisinopril 1.25 (one point two-five, not a typo) mg daily for now. No further lightheadedness.  - He has declined advanced therapies, including home milrinone.  - He is not a candidate for VAD or transplant. - Followed by [facility] > now graduated to palliative.  - Provided prescription for TED hose. 2. CAD s/p CABG - Most recent cath 01/2017 with patent SVG to diagonal, patent SVG to OM 2, and OM 3. Patent proximal LAD stent. LIMA to LAD and SVG to RCA known occluded.  - No s/s of ischemia.    - Continue statin.  - BB stopped with concern for low output. No ASA now that he is on Eliquis.  3. PAF - <1% burden by ICD  interrogation.  - Continue eliquis 2.5 mg BID. Cost is $45/month. He is tolerating. Denies bleeding. 4. HLD - Continue statin. No change.   Doing well overall. Continue palliative care. Discussed sliding scale diuretics. RTC 3 months. Sooner with symptoms. BMET last week stable.   Shirley Friar, PA-Reid 08/08/18   Greater than 50% of the 25 minute visit was  spent in counseling/coordination of care regarding disease state education, salt/fluid restriction, sliding scale diuretics, and medication compliance.

## 2018-08-09 ENCOUNTER — Encounter: Payer: Medicare Other | Admitting: Physician Assistant

## 2018-08-11 ENCOUNTER — Other Ambulatory Visit: Payer: Self-pay | Admitting: Internal Medicine

## 2018-09-07 ENCOUNTER — Other Ambulatory Visit: Payer: Medicare Other | Admitting: Adult Health Nurse Practitioner

## 2018-09-07 ENCOUNTER — Other Ambulatory Visit: Payer: Self-pay

## 2018-09-07 ENCOUNTER — Telehealth: Payer: Self-pay | Admitting: Adult Health Nurse Practitioner

## 2018-09-07 DIAGNOSIS — Z515 Encounter for palliative care: Secondary | ICD-10-CM

## 2018-09-07 NOTE — Progress Notes (Signed)
[place] Consult Note Telephone: [phone]  Fax: [phone]  PATIENT NAME: Louis Reid DOB: 08-11-1932 MRN: [id number]  PRIMARY CARE PROVIDER:   [name], MD  REFERRING PROVIDER:  [name], MD [place], [place] [address]  RESPONSIBLE PARTY:    Extended Emergency Contact Information Primary Emergency Contact: Linzy,[name] Address: [address], [place] [address] [place] Mobile Phone: [phone] Relation: Son   Due to the COVID-19 crisis, this telephone evaluation and treatment contact was done via telephone and it was initiated and consent by this patient and or family.    RECOMMENDATIONS and PLAN:  1.  Edema.  Patient states that his lasix was increased and he has been wearing TED hose.  States that his edema is much improved. Continue current dose of lasix and daily wearing TED hose.  2. Nutrition.  States that his taste is still decreased.  Continues to use vitamin B12 supplement.  Does state that his appetite is good and that his weight has been stable.    3.  Depression.  States that he is not having any depression right now.  States at times he gets down but is able to keep himself busy with his hobbies and feels as if it is well controlled.  4.  Goals of care.  Wants to go over MOST form at next telephone evaluation.  States that overall he is doing well and has no concerns at this time but would like to be contacted every couple weeks during this pandemic   I spent 30 minutes providing this consultation,  from 4:00 to 4:30. More than 50% of the time in this consultation was spent coordinating communication.   HISTORY OF PRESENT ILLNESS:  Louis Reid is a 85 y.o. year old male with multiple medical problems including CAD, cardiomyopathy, unstable angina, HFrEF, edema.. Palliative Care was asked to help address goals of care.    CODE STATUS: Full  PPS: 60% HOSPICE ELIGIBILITY/DIAGNOSIS: TBD  PAST MEDICAL HISTORY:  Past Medical History:  Diagnosis Date  . AICD (automatic cardioverter/defibrillator) present 09/13/2012  . Anemia   . CAD (coronary artery disease)    a. s/p remote CABG, b. AL STEMI c/b VF arrest in 05/2012 => LHC 05/10/12: Severe 3v CAD, S-OM1/2 ok, SVG-Dx ok, SVG-RCA occluded, LIMA-LAD atretic, proximal LAD occluded. EF 20% with anterior apical HK=> salvage PCI with POBA and thrombectomy of the occluded LAD;  c. 01/2017: atretic LIMA_LAD and known occluded SVG-RCA. Patent LAD stent and 70% proxRCA stenosis (FFR 0.88).   . Chronic systolic heart failure ([place])    a. Echo 12/13: EF 20-25%, anteroseptal, inferior, apical HK, mildly reduced RV function, trivial pericardial effusion;   b.  Echo 3/14:  Ant, septal, apical AK, EF 25%  . Clotting disorder ([place])   . Depression    "years ago" (02/28/2018)  . History of kidney stones   . HLD (hyperlipidemia)   . Hypertension    "I've never had high blood pressure; on RX for other reasons" (02/28/2018)  . Inguinal hernia    "have one now on my left side" (09/13/2012); (02/28/2018)  . Ischemic cardiomyopathy   . Myocardial infarction [facility]) 1996;  05/2012  . Osteoporosis   . Paroxysmal atrial fibrillation ([place]) 03/17/2016   noted on device interrogation,  will follow burden  . Pectus excavatum   . Pulmonary embolism (  [place])    a. after adhesiolysis for SBO in 04/2012 => coumadin for 6 mos  . Renal cyst   . SBO (small bowel obstruction) [facility]) November 2013   s/p lysis of adhesions  . Sinoatrial node dysfunction (HCC)   . Skin cancer    "left ear; face" (02/28/2018)  . Ventricular fibrillation ([place]) 05/2012   . in setting of AL STEMI 12/13 => EF 20-25% => d/c on LifeVest (repeat echo planned in 08/2012)    SOCIAL HX:  Social History   Tobacco Use  . Smoking status: Former Smoker    Types: Cigarettes  . Smokeless tobacco: Never Used  . Tobacco comment:  09/14/2011; 02/28/2018  "quit smoking when I was ~ 17; probably didn't smoke 10 packs in my life"  Substance Use Topics  . Alcohol use: Never    Frequency: Never    ALLERGIES: No Known Allergies   PERTINENT MEDICATIONS:  Outpatient Encounter Medications as of 09/07/2018  Medication Sig  . apixaban (ELIQUIS) 2.5 MG TABS tablet Take 1 tablet (2.5 mg total) by mouth 2 (two) times daily.  Marland Kitchen atorvastatin (LIPITOR) 20 MG tablet TAKE 1 TABLET BY MOUTH  DAILY AT 6 PM  . cholecalciferol (VITAMIN D) 1000 units tablet Take 1,000 Units by mouth daily.  . cyanocobalamin 1000 MCG tablet Take 2,000 mcg by mouth daily.   Marland Kitchen docusate sodium (COLACE) 100 MG capsule Take 100 mg by mouth 2 (two) times daily.   . feeding supplement, ENSURE ENLIVE, (ENSURE ENLIVE) LIQD Take 237 mLs by mouth 3 (three) times daily between meals.  . furosemide (LASIX) 40 MG tablet Take 20 mg by mouth daily.   Marland Kitchen lisinopril (PRINIVIL,ZESTRIL) 2.5 MG tablet Take 1.25 mg by mouth daily.  . Multiple Vitamins-Minerals (ICAPS AREDS 2 PO) Take 1 tablet by mouth 2 (two) times daily.   . nitroGLYCERIN (NITROSTAT) 0.4 MG SL tablet Place 1 tablet (0.4 mg total) under the tongue every 5 (five) minutes x 3 doses as needed for chest pain. Take only as needed for chest pain if your blood pressure is greater than 100/80.   No facility-administered encounter medications on file as of 09/07/2018.       Akia Montalban Jenetta Downer, NP

## 2018-09-07 NOTE — Telephone Encounter (Signed)
Called set up telephone evaluation in 5 minutes Ehan Freas K. Olena Heckle NP

## 2018-09-08 ENCOUNTER — Other Ambulatory Visit: Payer: Self-pay

## 2018-09-13 ENCOUNTER — Other Ambulatory Visit (HOSPITAL_COMMUNITY): Payer: Self-pay | Admitting: Internal Medicine

## 2018-09-21 ENCOUNTER — Other Ambulatory Visit: Payer: Medicare Other | Admitting: Adult Health Nurse Practitioner

## 2018-09-21 ENCOUNTER — Other Ambulatory Visit: Payer: Self-pay

## 2018-09-21 DIAGNOSIS — Z515 Encounter for palliative care: Secondary | ICD-10-CM

## 2018-09-21 NOTE — Progress Notes (Signed)
[place] Consult Note Telephone: [phone]  Fax: [phone]  PATIENT NAME: Louis Reid DOB: 1933/01/18 MRN: [id number]  PRIMARY CARE PROVIDER:   [name], MD  REFERRING PROVIDER:  [name], MD [place], [place] [address]  RESPONSIBLE PARTY:   Extended Emergency Contact Information Primary Emergency Contact: Louis Reid Address: [address], [place] [address] [place] Mobile Phone: [phone] Relation: Son  Due to the COVID-19 crisis, this visit was done via telemedicine and it was initiated and consent by this patient and or family.  Video-audio (telehealth) contact was unable to be done due technical barriers from the patient's side    RECOMMENDATIONS and PLAN:  1.  Edema.  Patient states that his edema has gone down considerably and he has taken off his TED hose.  Encouraged patient at the very least to wear the TED hose if he notices any increased swelling.  He endorsed understanding and agreement.  2. Nutrition.  States that his taste is still decreased.  Continues to use vitamin B12 supplement.  Weight has been stable.  Doesn't eat much but his appetite is fair and he does eat.  Louis Reid out today to get groceries.   4.  Goals of care.  Full Code.  Did not go over MOST form today as patient just got home with groceries and wanted to put them away.  Does state that he stays at home unless he absolutely has to go out, such as picking up groceries.  Does state that he wears a face mask when he goes out of the house, washes hands frequently, and sanitizes commonly used surfaces. Will have next visit in 2 weeks.  I spent 15 minutes providing this consultation,  from 12:30 to 12:45. More than 50% of the time in this consultation was spent coordinating communication.   HISTORY OF PRESENT ILLNESS:  Louis Reid is a 85 y.o. year old male with  multiple medical problems including CAD, cardiomyopathy, unstable angina, HFrEF, edema. Palliative Care was asked to help address goals of care.   CODE STATUS: Full code  PPS: 60% HOSPICE ELIGIBILITY/DIAGNOSIS: TBD  PAST MEDICAL HISTORY:  Past Medical History:  Diagnosis Date  . AICD (automatic cardioverter/defibrillator) present 09/13/2012  . Anemia   . CAD (coronary artery disease)    a. s/p remote CABG, b. AL STEMI c/b VF arrest in 05/2012 => LHC 05/10/12: Severe 3v CAD, S-OM1/2 ok, SVG-Dx ok, SVG-RCA occluded, LIMA-LAD atretic, proximal LAD occluded. EF 20% with anterior apical HK=> salvage PCI with POBA and thrombectomy of the occluded LAD;  c. 01/2017: atretic LIMA_LAD and known occluded SVG-RCA. Patent LAD stent and 70% proxRCA stenosis (FFR 0.88).   . Chronic systolic heart failure ([place])    a. Echo 12/13: EF 20-25%, anteroseptal, inferior, apical HK, mildly reduced RV function, trivial pericardial effusion;   b.  Echo 3/14:  Ant, septal, apical AK, EF 25%  . Clotting disorder ([place])   . Depression    "years ago" (02/28/2018)  . History of kidney stones   . HLD (hyperlipidemia)   . Hypertension    "I've never had high blood pressure; on RX for other reasons" (02/28/2018)  . Inguinal hernia    "have one now on my left side" (09/13/2012); (02/28/2018)  . Ischemic cardiomyopathy   . Myocardial infarction [facility]) 1996;  05/2012  . Osteoporosis   . Paroxysmal atrial fibrillation ([place]) 03/17/2016   noted  on device interrogation,  will follow burden  . Pectus excavatum   . Pulmonary embolism ([place])    a. after adhesiolysis for SBO in 04/2012 => coumadin for 6 mos  . Renal cyst   . SBO (small bowel obstruction) [facility]) November 2013   s/p lysis of adhesions  . Sinoatrial node dysfunction (H[facility])   . Skin cancer    "left ear; face" (02/28/2018)  . Ventricular fibrillation ([place]) 05/2012   . in setting of AL STEMI 12/13 => EF 20-25% => d/c on LifeVest (repeat echo planned in 08/2012)    SOCIAL  HX:  Social History   Tobacco Use  . Smoking status: Former Smoker    Types: Cigarettes  . Smokeless tobacco: Never Used  . Tobacco comment: 09/14/2011; 02/28/2018  "quit smoking when I was ~ 17; probably didn't smoke 10 packs in my life"  Substance Use Topics  . Alcohol use: Never    Frequency: Never    ALLERGIES: No Known Allergies   PERTINENT MEDICATIONS:  Outpatient Encounter Medications as of 09/21/2018  Medication Sig  . apixaban (ELIQUIS) 2.5 MG TABS tablet Take 1 tablet (2.5 mg total) by mouth 2 (two) times daily.  Marland Kitchen atorvastatin (LIPITOR) 20 MG tablet TAKE 1 TABLET BY MOUTH  DAILY AT 6 PM  . cholecalciferol (VITAMIN D) 1000 units tablet Take 1,000 Units by mouth daily.  . cyanocobalamin 1000 MCG tablet Take 2,000 mcg by mouth daily.   Marland Kitchen docusate sodium (COLACE) 100 MG capsule Take 100 mg by mouth 2 (two) times daily.   . feeding supplement, ENSURE ENLIVE, (ENSURE ENLIVE) LIQD Take 237 mLs by mouth 3 (three) times daily between meals.  . furosemide (LASIX) 40 MG tablet Take 20 mg by mouth daily.   Marland Kitchen lisinopril (PRINIVIL,ZESTRIL) 2.5 MG tablet Take 1.25 mg by mouth daily.  . Multiple Vitamins-Minerals (ICAPS AREDS 2 PO) Take 1 tablet by mouth 2 (two) times daily.   . nitroGLYCERIN (NITROSTAT) 0.4 MG SL tablet Place 1 tablet (0.4 mg total) under the tongue every 5 (five) minutes x 3 doses as needed for chest pain. Take only as needed for chest pain if your blood pressure is greater than 100/80.   No facility-administered encounter medications on file as of 09/21/2018.       Louis Reid Jenetta Downer, NP

## 2018-09-23 ENCOUNTER — Other Ambulatory Visit: Payer: Self-pay | Admitting: Cardiology

## 2018-09-23 MED ORDER — APIXABAN 2.5 MG PO TABS: 2.5000 mg | ORAL_TABLET | Freq: Two times a day (BID) | ORAL | 1 refills | Status: DC

## 2018-09-23 NOTE — Telephone Encounter (Signed)
New Message     *STAT* If patient is at the pharmacy, call can be transferred to refill team.   1. Which medications need to be refilled? (please list name of each medication and dose if known) Eliquis   2. Which pharmacy/location (including street and city if local pharmacy) is medication to be sent to? [facility]  [phone]  3. Do they need a 30 day or 90 day supply? [place]

## 2018-09-23 NOTE — Telephone Encounter (Signed)
Eliquis 2.5 mg refilled. 

## 2018-10-03 ENCOUNTER — Other Ambulatory Visit: Payer: Self-pay

## 2018-10-03 ENCOUNTER — Encounter: Payer: Self-pay | Admitting: Internal Medicine

## 2018-10-03 ENCOUNTER — Telehealth (INDEPENDENT_AMBULATORY_CARE_PROVIDER_SITE_OTHER): Payer: Medicare Other | Admitting: Internal Medicine

## 2018-10-03 VITALS — BP 136/85 | HR 86 | Wt 129.0 lb

## 2018-10-03 DIAGNOSIS — I4901 Ventricular fibrillation: Secondary | ICD-10-CM | POA: Diagnosis not present

## 2018-10-03 DIAGNOSIS — I5022 Chronic systolic (congestive) heart failure: Secondary | ICD-10-CM

## 2018-10-03 DIAGNOSIS — I255 Ischemic cardiomyopathy: Secondary | ICD-10-CM | POA: Diagnosis not present

## 2018-10-03 NOTE — Progress Notes (Signed)
Electrophysiology TeleHealth Note   Due to national recommendations of social distancing due to [place] 19, an audio telehealth visit is felt to be most appropriate for this patient at this time.  Verbal consent obtained from the patient today.  He does not have a smart phone or Internet.  He is unable to do a virtual visit.   Date:  10/03/2018   ID:  [name], DOB March 08, 1933, MRN [id number]  Location: patient's home  Provider location: [address], [place]  Evaluation Performed: Follow-up visit  PCP:  [name], MD  Cardiologist:   Dr Haroldine Laws Electrophysiologist:  Dr Rayann Heman  Chief Complaint:  CHF  History of Present Illness:    Louis Reid is a 85 y.o. male who presents via audio conferencing for a telehealth visit today. He is in hospice/ palliative care at home.  He has SOB with activity.  His edema is stable (improved with compression stockiings).  Today, he denies symptoms of palpitations, chest pain, dizziness, presyncope, or syncope.  The patient is otherwise without complaint today.  The patient denies symptoms of fevers, chills, cough, or new SOB worrisome for COVID 19.  Past Medical History:  Diagnosis Date  . AICD (automatic cardioverter/defibrillator) present 09/13/2012  . Anemia   . CAD (coronary artery disease)    a. s/p remote CABG, b. AL STEMI c/b VF arrest in 05/2012 => LHC 05/10/12: Severe 3v CAD, S-OM1/2 ok, SVG-Dx ok, SVG-RCA occluded, LIMA-LAD atretic, proximal LAD occluded. EF 20% with anterior apical HK=> salvage PCI with POBA and thrombectomy of the occluded LAD;  c. 01/2017: atretic LIMA_LAD and known occluded SVG-RCA. Patent LAD stent and 70% proxRCA stenosis (FFR 0.88).   . Chronic systolic heart failure ([place])    a. Echo 12/13: EF 20-25%, anteroseptal, inferior, apical HK, mildly reduced RV function, trivial pericardial effusion;   b.  Echo 3/14:  Ant, septal, apical AK, EF 25%  . Clotting disorder ([place])   . Depression     "years ago" (02/28/2018)  . History of kidney stones   . HLD (hyperlipidemia)   . Hypertension    "I've never had high blood pressure; on RX for other reasons" (02/28/2018)  . Inguinal hernia    "have one now on my left side" (09/13/2012); (02/28/2018)  . Ischemic cardiomyopathy   . Myocardial infarction [facility]) 1996;  05/2012  . Osteoporosis   . Paroxysmal atrial fibrillation ([place]) 03/17/2016   noted on device interrogation,  will follow burden  . Pectus excavatum   . Pulmonary embolism ([place])    a. after adhesiolysis for SBO in 04/2012 => coumadin for 6 mos  . Renal cyst   . SBO (small bowel obstruction) [facility]) November 2013   s/p lysis of adhesions  . Sinoatrial node dysfunction (HCC)   . Skin cancer    "left ear; face" (02/28/2018)  . Ventricular fibrillation ([place]) 05/2012   . in setting of AL STEMI 12/13 => EF 20-25% => d/c on LifeVest (repeat echo planned in 08/2012)    Past Surgical History:  Procedure Laterality Date  . CATARACT EXTRACTION W/ INTRAOCULAR LENS  IMPLANT, BILATERAL Bilateral   . COLONOSCOPY    . CORONARY ANGIOPLASTY WITH STENT PLACEMENT     "I've got 2 or 3 stents" (02/28/2018)  . CORONARY ARTERY BYPASS GRAFT  1996   "CABG X5" (09/13/2012)  . CYSTOSCOPY/RETROGRADE/URETEROSCOPY/STONE EXTRACTION WITH BASKET  1990's  . ESOPHAGOGASTRODUODENOSCOPY  05/08/2012   Procedure: ESOPHAGOGASTRODUODENOSCOPY (EGD);  Surgeon: Juanita Craver,  MD;  Location: [facility];  Service: Endoscopy;  Laterality: N/A;  Nevin Bloodgood put on for Dr. Collene Mares , Dr. Collene Mares will call if she wants another time  . IMPLANTABLE CARDIOVERTER DEFIBRILLATOR IMPLANT N/A 09/13/2012   SJM Ellipse ER implanted by Dr Rayann Heman for secondary prevention  . INGUINAL HERNIA REPAIR Right 1996  . INTRAVASCULAR PRESSURE WIRE/FFR STUDY N/A 01/20/2017   Procedure: INTRAVASCULAR PRESSURE WIRE/FFR STUDY;  Surgeon: Wellington Hampshire, MD;  Location: [facility];  Service: Cardiovascular;  Laterality: N/A;  . LAPAROTOMY  04/27/2012    Procedure: EXPLORATORY LAPAROTOMY;  Surgeon: Gwenyth Ober, MD;  Location: [facility];  Service: General;  Laterality: N/A;  . LEFT HEART CATH AND CORS/GRAFTS ANGIOGRAPHY N/A 01/20/2017   Procedure: LEFT HEART CATH AND CORS/GRAFTS ANGIOGRAPHY;  Surgeon: Wellington Hampshire, MD;  Location: [facility];  Service: Cardiovascular;  Laterality: N/A;  . LEFT HEART CATHETERIZATION WITH CORONARY ANGIOGRAM N/A 05/10/2012   Procedure: LEFT HEART CATHETERIZATION WITH CORONARY ANGIOGRAM;  Surgeon: Burnell Blanks, MD;  Location: [facility];  Service: Cardiovascular;  Laterality: N/A;  . PTCA     percutaneous transluminal coronary intervention and brachy therapy, Bruce R. Olevia Perches, MD. EF 60%  . RIGHT HEART CATH N/A 03/01/2018   Procedure: RIGHT HEART CATH;  Surgeon: Jolaine Artist, MD;  Location: [facility];  Service: Cardiovascular;  Laterality: N/A;  . SKIN CANCER EXCISION     "left ear; face" (02/28/2018)  . TRANSURETHRAL RESECTION OF PROSTATE      Current Outpatient Medications  Medication Sig Dispense Refill  . apixaban (ELIQUIS) 2.5 MG TABS tablet Take 1 tablet (2.5 mg total) by mouth 2 (two) times daily. 180 tablet 1  . atorvastatin (LIPITOR) 20 MG tablet TAKE 1 TABLET BY MOUTH  DAILY AT 6 PM 90 tablet 2  . cholecalciferol (VITAMIN D) 1000 units tablet Take 1,000 Units by mouth daily.    . cyanocobalamin 1000 MCG tablet Take 2,000 mcg by mouth daily.     Marland Kitchen docusate sodium (COLACE) 100 MG capsule Take 100 mg by mouth 2 (two) times daily.     . feeding supplement, ENSURE ENLIVE, (ENSURE ENLIVE) LIQD Take 237 mLs by mouth 3 (three) times daily between meals. 237 mL 12  . furosemide (LASIX) 40 MG tablet Take 20 mg by mouth daily.     Marland Kitchen lisinopril (PRINIVIL,ZESTRIL) 2.5 MG tablet Take 1.25 mg by mouth daily.    . Multiple Vitamins-Minerals (ICAPS AREDS 2 PO) Take 1 tablet by mouth 2 (two) times daily.     . nitroGLYCERIN (NITROSTAT) 0.4 MG SL tablet Place 1 tablet (0.4 mg total) under  the tongue every 5 (five) minutes x 3 doses as needed for chest pain. Take only as needed for chest pain if your blood pressure is greater than 100/80. 25 tablet 12   No current facility-administered medications for this visit.     Allergies:   Patient has no known allergies.   Social History:  The patient  reports that he has quit smoking. His smoking use included cigarettes. He has never used smokeless tobacco. He reports that he does not drink alcohol or use drugs.   Family History:  The patient's  family history includes Cancer in his brother; Colon cancer in his brother; Heart disease in his brother and father; Other in his mother.   ROS:  Please see the history of present illness.   All other systems are personally reviewed and negative.    Exam:  Vital Signs:  There were no vitals taken for this visit.  Well sounding, not tachypneaic   Labs/Other Tests and Data Reviewed:    Recent Labs: 03/01/2018: Hemoglobin 13.3; Platelets 120 04/18/2018: B Natriuretic Peptide 282.8; BUN 21; Creatinine, Ser 1.29; Potassium 4.3; Sodium 140   Wt Readings from Last 3 Encounters:  08/08/18 137 lb (62.1 kg)  06/13/18 132 lb 12.8 oz (60.2 kg)  06/02/18 122 lb (55.3 kg)     Other studies personally reviewed: Additional studies/ records that were reviewed today include: CHF clinic, my prior office notes  Review of the above records today demonstrates: as above  [place] 03/01/18 is reviewed   Last device interrogation 05/16/18 is reviewed and normal   ASSESSMENT & PLAN:    1.  Chronic systolic dysfunction/ ischemic CM/ chronic systolic dysfunciton/ prior VF arrest Last device interrogation was normal.  Unfortunately he has poor cell reception in [place] where he lives.  We cannot follow his device remotely.  He was previously being seen in the office every 3 months. We will defer any additional device interrogations until after COVID 19 has passed. He is on home with hospice.  We discussed  option of turner his ICD therapies off.  He is aware that this is an option but does not wish to proceed at this time.    2. HTN Stable No change required today  3. afib Well controlled On eliquis  4. COVID 19 screen The patient denies symptoms of COVID 19 at this time.  The importance of social distancing was discussed today.  Follow-up:  Return to see EP NP in 6 months  Current medicines are reviewed at length with the patient today.   The patient does not have concerns regarding his medicines.  The following changes were made today:  none  Labs/ tests ordered today include:  No orders of the defined types were placed in this encounter.  Patient Risk:  after full review of this patients clinical status, I feel that they are at high risk at this time.  Today, I have spent 15 minutes with the patient with telehealth technology discussing CHF .    Army Fossa, MD  10/03/2018 2:56 PM     [address] [place] [address] [phone] (office) [phone] (fax)

## 2018-10-05 ENCOUNTER — Other Ambulatory Visit: Payer: Medicare Other | Admitting: Adult Health Nurse Practitioner

## 2018-10-05 ENCOUNTER — Other Ambulatory Visit: Payer: Self-pay

## 2018-10-05 DIAGNOSIS — Z515 Encounter for palliative care: Secondary | ICD-10-CM

## 2018-10-05 NOTE — Progress Notes (Signed)
[facility] Consult Note Telephone: [phone]  Fax: [phone]  PATIENT NAME: Louis Reid DOB: 1932/09/04 MRN: [id number]  PRIMARY CARE PROVIDER:   [name], MD  REFERRING PROVIDER:  [name], MD [place], [place] [address]  RESPONSIBLE PARTY:   Extended Emergency Contact Information Primary Emergency Contact: Thatch,[name] Address: [place],  [address] [place] Mobile Phone: [phone] Relation: Son  Due to the COVID-19 crisis, this visit was done via telemedicine and it was initiated and consent by this patient and or family.  Video-audio (telehealth) contact was unable to be done due technical barriers from the patient's side     RECOMMENDATIONS and PLAN:  1.  Edema. Patient states that his edema is well controlled with the increase in lasix.  Now that the edema has gone down he has not been wearing TED hose with no complaints of edema today.Continue to encourage to wear TED hose if notices any increased edema.  Had telehealth appointment with cardiologist on 4/27.    2. Nutrition. States that his taste is still decreased. Continues to use vitamin B12 supplement. Weight has been stable.  Doesn't eat much but his appetite is fair and he does eat.  States having no problems with appetite and has been eating well.  4. Goals of care. Full Code.  Prefers to go over advanced care planning in person.  Will further discuss once able to meet in person.  Still following safe social distancing.  Had some questions about COVID.  Went over signs and symptoms to report to PCP.  Instructed to call PCP if having any signs or symptoms and if having contact with someone positive for COVID.  I spent 30 minutes providing this consultation,  from 10:30 to 11:00. More than 50% of the time in this consultation was spent coordinating communication.    HISTORY OF PRESENT ILLNESS:  Louis Reid is a 85 y.o. year old male with multiple medical problems including CAD, cardiomyopathy, unstable angina, HFrEF, edema. Palliative Care was asked to help address goals of care.   CODE STATUS: Full Code  PPS: 60% HOSPICE ELIGIBILITY/DIAGNOSIS: TBD  PAST MEDICAL HISTORY:  Past Medical History:  Diagnosis Date  . AICD (automatic cardioverter/defibrillator) present 09/13/2012  . Anemia   . CAD (coronary artery disease)    a. s/p remote CABG, b. AL STEMI c/b VF arrest in 05/2012 => LHC 05/10/12: Severe 3v CAD, S-OM1/2 ok, SVG-Dx ok, SVG-RCA occluded, LIMA-LAD atretic, proximal LAD occluded. EF 20% with anterior apical HK=> salvage PCI with POBA and thrombectomy of the occluded LAD;  c. 01/2017: atretic LIMA_LAD and known occluded SVG-RCA. Patent LAD stent and 70% proxRCA stenosis (FFR 0.88).   . Chronic systolic heart failure ([place])    a. Echo 12/13: EF 20-25%, anteroseptal, inferior, apical HK, mildly reduced RV function, trivial pericardial effusion;   b.  Echo 3/14:  Ant, septal, apical AK, EF 25%  . Clotting disorder ([place])   . Depression    "years ago" (02/28/2018)  . History of kidney stones   . HLD (hyperlipidemia)   . Hypertension    "I've never had high blood pressure; on RX for other reasons" (02/28/2018)  . Inguinal hernia    "have one now on my left side" (09/13/2012); (02/28/2018)  . Ischemic cardiomyopathy   . Myocardial infarction [facility]) 1996;  05/2012  . Osteoporosis   . Paroxysmal atrial fibrillation ([place]) 03/17/2016  noted on device interrogation,  will follow burden  . Pectus excavatum   . Pulmonary embolism ([place])    a. after adhesiolysis for SBO in 04/2012 => coumadin for 6 mos  . Renal cyst   . SBO (small bowel obstruction) [facility]) November 2013   s/p lysis of adhesions  . Sinoatrial node dysfunction (HCC)   . Skin cancer    "left ear; face" (02/28/2018)  . Ventricular fibrillation ([place]) 05/2012   . in setting of AL STEMI  12/13 => EF 20-25% => d/c on LifeVest (repeat echo planned in 08/2012)    SOCIAL HX:  Social History   Tobacco Use  . Smoking status: Former Smoker    Types: Cigarettes  . Smokeless tobacco: Never Used  . Tobacco comment: 09/14/2011; 02/28/2018  "quit smoking when I was ~ 17; probably didn't smoke 10 packs in my life"  Substance Use Topics  . Alcohol use: Never    Frequency: Never    ALLERGIES: No Known Allergies   PERTINENT MEDICATIONS:  Outpatient Encounter Medications as of 10/05/2018  Medication Sig  . apixaban (ELIQUIS) 2.5 MG TABS tablet Take 1 tablet (2.5 mg total) by mouth 2 (two) times daily.  Marland Kitchen atorvastatin (LIPITOR) 20 MG tablet TAKE 1 TABLET BY MOUTH  DAILY AT 6 PM  . cholecalciferol (VITAMIN D) 1000 units tablet Take 1,000 Units by mouth daily.  . cyanocobalamin 1000 MCG tablet Take 2,000 mcg by mouth daily.   Marland Kitchen docusate sodium (COLACE) 100 MG capsule Take 100 mg by mouth 2 (two) times daily.   . feeding supplement, ENSURE ENLIVE, (ENSURE ENLIVE) LIQD Take 237 mLs by mouth 3 (three) times daily between meals.  . furosemide (LASIX) 40 MG tablet Take 20 mg by mouth daily.   Marland Kitchen lisinopril (PRINIVIL,ZESTRIL) 2.5 MG tablet Take 1.25 mg by mouth daily.  . Multiple Vitamins-Minerals (ICAPS AREDS 2 PO) Take 1 tablet by mouth 2 (two) times daily.   . nitroGLYCERIN (NITROSTAT) 0.4 MG SL tablet Place 1 tablet (0.4 mg total) under the tongue every 5 (five) minutes x 3 doses as needed for chest pain. Take only as needed for chest pain if your blood pressure is greater than 100/80.   No facility-administered encounter medications on file as of 10/05/2018.       Messi Twedt Jenetta Downer, NP

## 2018-10-13 ENCOUNTER — Other Ambulatory Visit: Payer: Self-pay

## 2018-10-13 MED ORDER — LISINOPRIL 2.5 MG PO TABS: 1.2500 mg | ORAL_TABLET | Freq: Every day | ORAL | 1 refills | Status: DC

## 2018-10-18 ENCOUNTER — Other Ambulatory Visit (HOSPITAL_COMMUNITY): Payer: Self-pay

## 2018-10-18 MED ORDER — FUROSEMIDE 40 MG PO TABS: 20.0000 mg | ORAL_TABLET | Freq: Every day | ORAL | 3 refills | Status: DC

## 2018-10-19 ENCOUNTER — Other Ambulatory Visit: Payer: Medicare Other | Admitting: Adult Health Nurse Practitioner

## 2018-10-19 ENCOUNTER — Other Ambulatory Visit: Payer: Self-pay

## 2018-10-19 DIAGNOSIS — Z515 Encounter for palliative care: Secondary | ICD-10-CM

## 2018-10-19 NOTE — Progress Notes (Signed)
[facility] Consult Note Telephone: [phone]  Fax: [phone]  PATIENT NAME: Louis Reid DOB: 12-01-1932 MRN: [id number]  PRIMARY CARE PROVIDER:   [name], MD  REFERRING PROVIDER:  [name], MD [place], [place] [address]  RESPONSIBLE PARTY:   Extended Emergency Contact Information Primary Emergency Contact: Aguinaga,[name] Address: [address] [place], [place] [address] [place] Mobile Phone: [phone] Relation: Son  Due to the COVID-19 crisis, this visit was done via telemedicine and it was initiated and consent by this patient and or family. Video-audio (telehealth) contact was unable to be done due technical barriers from the patient's side     RECOMMENDATIONS and PLAN:  1.  Edema.Patient states that he has no edema today.  States that he has not had to wear his compression stockings for a while.  Denies any SOB, cough, chest pain.  Continue to encourage to wear TED hose if notices any increased edema.  continue current dose of lasix  2. Nutrition. States that his taste is still decreased. Continues to use vitamin B12 supplement.Weight has been stable. States that when he eats he gets full quickly.  Have encouraged him to eat smaller more frequent meals  4. Goals of care.Full Code. Prefers to go over advanced care planning in person.  Will further discuss once able to meet in person.  Still following safe social distancing.   I spent 30 minutes providing this consultation,  from 11:20 to 11:50. More than 50% of the time in this consultation was spent coordinating communication.   HISTORY OF PRESENT ILLNESS:  Louis Reid is a 85 y.o. year old male with multiple medical problems including CAD, cardiomyopathy, unstable angina, HFrEF, edema. Palliative Care was asked to help address goals of care.   CODE STATUS: full code  PPS:  60% HOSPICE ELIGIBILITY/DIAGNOSIS: TBD  PAST MEDICAL HISTORY:  Past Medical History:  Diagnosis Date  . AICD (automatic cardioverter/defibrillator) present 09/13/2012  . Anemia   . CAD (coronary artery disease)    a. s/p remote CABG, b. AL STEMI c/b VF arrest in 05/2012 => LHC 05/10/12: Severe 3v CAD, S-OM1/2 ok, SVG-Dx ok, SVG-RCA occluded, LIMA-LAD atretic, proximal LAD occluded. EF 20% with anterior apical HK=> salvage PCI with POBA and thrombectomy of the occluded LAD;  c. 01/2017: atretic LIMA_LAD and known occluded SVG-RCA. Patent LAD stent and 70% proxRCA stenosis (FFR 0.88).   . Chronic systolic heart failure ([place])    a. Echo 12/13: EF 20-25%, anteroseptal, inferior, apical HK, mildly reduced RV function, trivial pericardial effusion;   b.  Echo 3/14:  Ant, septal, apical AK, EF 25%  . Clotting disorder ([place])   . Depression    "years ago" (02/28/2018)  . History of kidney stones   . HLD (hyperlipidemia)   . Hypertension    "I've never had high blood pressure; on RX for other reasons" (02/28/2018)  . Inguinal hernia    "have one now on my left side" (09/13/2012); (02/28/2018)  . Ischemic cardiomyopathy   . Myocardial infarction [facility]) 1996;  05/2012  . Osteoporosis   . Paroxysmal atrial fibrillation ([place]) 03/17/2016   noted on device interrogation,  will follow burden  . Pectus excavatum   . Pulmonary embolism ([place])    a. after adhesiolysis for SBO in 04/2012 => coumadin for 6 mos  . Renal cyst   . SBO (small bowel obstruction) [facility]) November 2013   s/p  lysis of adhesions  . Sinoatrial node dysfunction (HCC)   . Skin cancer    "left ear; face" (02/28/2018)  . Ventricular fibrillation ([place]) 05/2012   . in setting of AL STEMI 12/13 => EF 20-25% => d/c on LifeVest (repeat echo planned in 08/2012)    SOCIAL HX:  Social History   Tobacco Use  . Smoking status: Former Smoker    Types: Cigarettes  . Smokeless tobacco: Never Used  . Tobacco comment: 09/14/2011; 02/28/2018  "quit  smoking when I was ~ 17; probably didn't smoke 10 packs in my life"  Substance Use Topics  . Alcohol use: Never    Frequency: Never    ALLERGIES: No Known Allergies   PERTINENT MEDICATIONS:  Outpatient Encounter Medications as of 10/19/2018  Medication Sig  . apixaban (ELIQUIS) 2.5 MG TABS tablet Take 1 tablet (2.5 mg total) by mouth 2 (two) times daily.  Marland Kitchen atorvastatin (LIPITOR) 20 MG tablet TAKE 1 TABLET BY MOUTH  DAILY AT 6 PM  . cholecalciferol (VITAMIN D) 1000 units tablet Take 1,000 Units by mouth daily.  . cyanocobalamin 1000 MCG tablet Take 2,000 mcg by mouth daily.   Marland Kitchen docusate sodium (COLACE) 100 MG capsule Take 100 mg by mouth 2 (two) times daily.   . feeding supplement, ENSURE ENLIVE, (ENSURE ENLIVE) LIQD Take 237 mLs by mouth 3 (three) times daily between meals.  . furosemide (LASIX) 40 MG tablet Take 0.5 tablets (20 mg total) by mouth daily.  Marland Kitchen lisinopril (ZESTRIL) 2.5 MG tablet Take 0.5 tablets (1.25 mg total) by mouth daily.  . Multiple Vitamins-Minerals (ICAPS AREDS 2 PO) Take 1 tablet by mouth 2 (two) times daily.   . nitroGLYCERIN (NITROSTAT) 0.4 MG SL tablet Place 1 tablet (0.4 mg total) under the tongue every 5 (five) minutes x 3 doses as needed for chest pain. Take only as needed for chest pain if your blood pressure is greater than 100/80.   No facility-administered encounter medications on file as of 10/19/2018.       Asuncion Tapscott Jenetta Downer, NP

## 2018-10-26 ENCOUNTER — Telehealth: Payer: Self-pay | Admitting: Adult Health Nurse Practitioner

## 2018-10-26 ENCOUNTER — Other Ambulatory Visit (HOSPITAL_COMMUNITY): Payer: Self-pay

## 2018-10-26 NOTE — Telephone Encounter (Signed)
[facility] on [address] to [facility]/c their incorrect dose of furosemide and also let them know that he will be picking up new dose from a mail home service. Pt home health nurse had called stating that he keeps getting [facility] trying to [facility] an old dose of furosemide. Home health nurse concerned that he would get confused and take wrong dose. Verified correct dose with home health nurse.

## 2018-10-26 NOTE — Telephone Encounter (Signed)
Patient called with concerns about refills for his medications.  States that he usually just has it refilled through [facility] order.  Has been [facility] about [facility] his [facility].  States that he prefers to just use the mail order through [facility].  [facility] was for his lasix but at a different dosage than what he takes and feels like it is coming from his cardiologist, Dr. Haroldine Laws.  Reached out to Dr. Clayborne Dana office and they clarified that they have been using the mail order through [facility] for his refills and will reach out to [facility] to see where the confusion is. Amy K. Olena Heckle NP

## 2018-11-02 ENCOUNTER — Other Ambulatory Visit: Payer: Medicare Other | Admitting: Adult Health Nurse Practitioner

## 2018-11-02 ENCOUNTER — Other Ambulatory Visit: Payer: Self-pay

## 2018-11-02 DIAGNOSIS — Z515 Encounter for palliative care: Secondary | ICD-10-CM

## 2018-11-02 NOTE — Progress Notes (Signed)
[facility] Consult Note Telephone: [phone]  Fax: [phone]  PATIENT NAME: Louis Reid DOB: 04/29/1933 MRN: [id number]  PRIMARY CARE PROVIDER:   [name], MD  REFERRING PROVIDER:  [name], MD [place], [place] [address]  RESPONSIBLE PARTY:   Extended Emergency Contact Information Primary Emergency Contact: Louis Reid Address: [address], [place] [address] [place] Mobile Phone: [phone] Relation: Son  Due to the COVID-19 crisis, this visit was done via telemedicine and it was initiated and consent by this patient and or family. Video-audio (telehealth) contact was unable to be done due technical barriers from the patient's side     RECOMMENDATIONS and PLAN:  1.  Edema.Patient denies any edema today.  Does state that every now and then he feels like he is getting a little edema which goes away when he props up his feet.  Does not feel like it is enough to wear his TED hose or increase any meds.  Had an incident last week in which he called me about [facility].  States that he gets his meds through [employer] order and prefers to continue this.  Had called his cardiologist because it was his lasix [facility] was [facility] about and they confirmed that they do use the [facility] mail order to refill his prescriptions.   2. Nutrition. States that his taste is still decreased. Continues to use vitamin B12 supplement.Weight has been stable. Patient states that his appetite has not changed but continues to eat more frequent smaller meals.  4. Goals of care.Full Code. Prefers to go over advanced care planning in person. Will further discuss once able to meet in person. Still following safe social distancing  I spent 25 minutes providing this consultation,  from 10:00 to 10:25. More than  50% of the time in this consultation was spent coordinating communication.   HISTORY OF PRESENT ILLNESS:  Louis Reid is a 85 y.o. year old male with multiple medical problems including CAD, cardiomyopathy, unstable angina, HFrEF, edema. Palliative Care was asked to help address goals of care.   CODE STATUS: Full Code  PPS: 60% HOSPICE ELIGIBILITY/DIAGNOSIS: TBD  PAST MEDICAL HISTORY:  Past Medical History:  Diagnosis Date  . AICD (automatic cardioverter/defibrillator) present 09/13/2012  . Anemia   . CAD (coronary artery disease)    a. s/p remote CABG, b. AL STEMI c/b VF arrest in 05/2012 => LHC 05/10/12: Severe 3v CAD, S-OM1/2 ok, SVG-Dx ok, SVG-RCA occluded, LIMA-LAD atretic, proximal LAD occluded. EF 20% with anterior apical HK=> salvage PCI with POBA and thrombectomy of the occluded LAD;  c. 01/2017: atretic LIMA_LAD and known occluded SVG-RCA. Patent LAD stent and 70% proxRCA stenosis (FFR 0.88).   . Chronic systolic heart failure ([place])    a. Echo 12/13: EF 20-25%, anteroseptal, inferior, apical HK, mildly reduced RV function, trivial pericardial effusion;   b.  Echo 3/14:  Ant, septal, apical AK, EF 25%  . Clotting disorder ([place])   . Depression    "years ago" (02/28/2018)  . History of kidney stones   . HLD (hyperlipidemia)   . Hypertension    "I've never had high blood pressure; on RX for other reasons" (02/28/2018)  . Inguinal hernia    "have one now on my left side" (09/13/2012); (02/28/2018)  . Ischemic cardiomyopathy   . Myocardial infarction [facility]) 1996;  05/2012  .  Osteoporosis   . Paroxysmal atrial fibrillation ([place]) 03/17/2016   noted on device interrogation,  will follow burden  . Pectus excavatum   . Pulmonary embolism ([place])    a. after adhesiolysis for SBO in 04/2012 => coumadin for 6 mos  . Renal cyst   . SBO (small bowel obstruction) [facility]) November 2013   s/p lysis of adhesions  . Sinoatrial node dysfunction (HCC)   . Skin cancer    "left ear; face"  (02/28/2018)  . Ventricular fibrillation ([place]) 05/2012   . in setting of AL STEMI 12/13 => EF 20-25% => d/c on LifeVest (repeat echo planned in 08/2012)    SOCIAL HX:  Social History   Tobacco Use  . Smoking status: Former Smoker    Types: Cigarettes  . Smokeless tobacco: Never Used  . Tobacco comment: 09/14/2011; 02/28/2018  "quit smoking when I was ~ 17; probably didn't smoke 10 packs in my life"  Substance Use Topics  . Alcohol use: Never    Frequency: Never    ALLERGIES: No Known Allergies   PERTINENT MEDICATIONS:  Outpatient Encounter Medications as of 11/02/2018  Medication Sig  . apixaban (ELIQUIS) 2.5 MG TABS tablet Take 1 tablet (2.5 mg total) by mouth 2 (two) times daily.  Marland Kitchen atorvastatin (LIPITOR) 20 MG tablet TAKE 1 TABLET BY MOUTH  DAILY AT 6 PM  . cholecalciferol (VITAMIN D) 1000 units tablet Take 1,000 Units by mouth daily.  . cyanocobalamin 1000 MCG tablet Take 2,000 mcg by mouth daily.   Marland Kitchen docusate sodium (COLACE) 100 MG capsule Take 100 mg by mouth 2 (two) times daily.   . feeding supplement, ENSURE ENLIVE, (ENSURE ENLIVE) LIQD Take 237 mLs by mouth 3 (three) times daily between meals.  . furosemide (LASIX) 40 MG tablet Take 0.5 tablets (20 mg total) by mouth daily.  Marland Kitchen lisinopril (ZESTRIL) 2.5 MG tablet Take 0.5 tablets (1.25 mg total) by mouth daily.  . Multiple Vitamins-Minerals (ICAPS AREDS 2 PO) Take 1 tablet by mouth 2 (two) times daily.   . nitroGLYCERIN (NITROSTAT) 0.4 MG SL tablet Place 1 tablet (0.4 mg total) under the tongue every 5 (five) minutes x 3 doses as needed for chest pain. Take only as needed for chest pain if your blood pressure is greater than 100/80.   No facility-administered encounter medications on file as of 11/02/2018.        Louis Beretta Jenetta Downer, NP

## 2018-11-14 ENCOUNTER — Telehealth: Payer: Self-pay | Admitting: Adult Health Nurse Practitioner

## 2018-11-14 ENCOUNTER — Telehealth (HOSPITAL_COMMUNITY): Payer: Self-pay | Admitting: *Deleted

## 2018-11-14 NOTE — Telephone Encounter (Signed)
Patient called over the weekend complaining of increased fatigue and weakness.  Thinks it may be his medications.  Spoke with Nira Conn at Dr. Damaris Schooner office going over symptoms and medications and will discuss with Dr. Jeffie Pollock and reach out to patient. Lunell Robart K. Olena Heckle NP

## 2018-11-14 NOTE — Telephone Encounter (Signed)
Amy, NP w/Palliative Care called to let us know pt was c/o increased fatigue and weakness.  Denied any other issues and feels it is from his medications.  Reviewing meds pt is only on Lasix 20 mg daily and Lisinopril 2.5 mg daily.  He is sch for a telehealth visit w/Dr Bensimhon on Mon 6/15.  Will send to him for review

## 2018-11-15 ENCOUNTER — Other Ambulatory Visit: Payer: Self-pay

## 2018-11-15 ENCOUNTER — Other Ambulatory Visit: Payer: Medicare Other | Admitting: Adult Health Nurse Practitioner

## 2018-11-15 DIAGNOSIS — Z515 Encounter for palliative care: Secondary | ICD-10-CM

## 2018-11-15 NOTE — Telephone Encounter (Signed)
Stop lisinopril.

## 2018-11-15 NOTE — Progress Notes (Signed)
[facility] Consult Note Telephone: [phone]  Fax: [phone]  PATIENT NAME: Louis Reid DOB: 1932-06-19 MRN: [id number]  PRIMARY CARE PROVIDER:   [name], MD  REFERRING PROVIDER:  [name], MD [name], [name] [address]  RESPONSIBLE PARTY:   Extended Emergency Contact Information Primary Emergency Contact: Louis Reid Address: [address] [place], [place] [address] [place] Mobile Phone: [phone] Relation: Son  Due to the COVID-19 crisis, this visit was done via telemedicine and it was initiated and consent by this patient and or family. Video-audio (telehealth) contact was unable to be done due technical barriers from the patients side     RECOMMENDATIONS and PLAN:  1.  Fatigue and weakness.  Patient called over the weekend and today complaining of fatigue and weakness.  He denies chest pain, cough, fever, increased SOB, edema. No reports of pain or body aches.  Believes it is his medications as he was looking up the side effects and all of them have side effects of weakness and/or fatigue.  He is on lasix 20 mg daily, lisinopril 2.5 mg daily, atorvastatin 20 mg daily, and eliquis.  On 11/14/2018 called his cardiologist's office and left message with RN about his complaints and concerns.  Indicated that they would give message to Dr. Haroldine Reid and give the patient a call.  He does have telehealth appt previously scheduled for 11/21/2018.  Patient is on lasix and is not on a potassium supplement.  Suspect possible hypokalemia or hyponatremia.  Called patient's PCP today and discussed concerns and patient can come in 8:30 -1:00 without an appointment to have blood drawn.  Called patient back and encouraged him to go to his PCP and have blood work drawn.  He said that he will go today and call Dr. Haroldine Reid to see what they recommend.  Will follow up  with patient later this week to see results of blood work and if any changes to medications were made.  I spent 30 minutes providing this consultation,  from 10:15 to 10:45. More than 50% of the time in this consultation was spent coordinating communication.   HISTORY OF PRESENT ILLNESS:  Louis Reid is a 85 y.o. year old male with multiple medical problems including CAD, cardiomyopathy, unstable angina, HFrEF, edema. Palliative Care was asked to help address goals of care.   CODE STATUS: Full Code  PPS: 60% HOSPICE ELIGIBILITY/DIAGNOSIS: TBD  PAST MEDICAL HISTORY:  Past Medical History:  Diagnosis Date   AICD (automatic cardioverter/defibrillator) present 09/13/2012   Anemia    CAD (coronary artery disease)    a. s/p remote CABG, b. AL STEMI c/b VF arrest in 05/2012 => LHC 05/10/12: Severe 3v CAD, S-OM1/2 ok, SVG-Dx ok, SVG-RCA occluded, LIMA-LAD atretic, proximal LAD occluded. EF 20% with anterior apical HK=> salvage PCI with POBA and thrombectomy of the occluded LAD;  c. 01/2017: atretic LIMA_LAD and known occluded SVG-RCA. Patent LAD stent and 70% proxRCA stenosis (FFR 0.88).    Chronic systolic heart failure ([place])    a. Echo 12/13: EF 20-25%, anteroseptal, inferior, apical HK, mildly reduced RV function, trivial pericardial effusion;   b.  Echo 3/14:  Ant, septal, apical AK, EF 25%   Clotting disorder ([place])    Depression    "years ago" (02/28/2018)   History of kidney stones    HLD (hyperlipidemia)    Hypertension    "I've never had high blood pressure; on  RX for other reasons" (02/28/2018)   Inguinal hernia    "have one now on my left side" (09/13/2012); (02/28/2018)   Ischemic cardiomyopathy    Myocardial infarction [facility]) 1996;  05/2012   Osteoporosis    Paroxysmal atrial fibrillation ([place]) 03/17/2016   noted on device interrogation,  will follow burden   Pectus excavatum    Pulmonary embolism ([place])    a. after adhesiolysis for SBO in 04/2012 => coumadin for 6  mos   Renal cyst    SBO (small bowel obstruction) [facility]) November 2013   s/p lysis of adhesions   Sinoatrial node dysfunction ([place])    Skin cancer    "left ear; face" (02/28/2018)   Ventricular fibrillation ([place]) 05/2012   . in setting of AL STEMI 12/13 => EF 20-25% => d/c on LifeVest (repeat echo planned in 08/2012)    SOCIAL HX:  Social History   Tobacco Use   Smoking status: Former Smoker    Types: Cigarettes   Smokeless tobacco: Never Used   Tobacco comment: 09/14/2011; 02/28/2018  "quit smoking when I was ~ 17; probably didn't smoke 10 packs in my life"  Substance Use Topics   Alcohol use: Never    Frequency: Never    ALLERGIES: No Known Allergies   PERTINENT MEDICATIONS:  Outpatient Encounter Medications as of 11/15/2018  Medication Sig   apixaban (ELIQUIS) 2.5 MG TABS tablet Take 1 tablet (2.5 mg total) by mouth 2 (two) times daily.   atorvastatin (LIPITOR) 20 MG tablet TAKE 1 TABLET BY MOUTH  DAILY AT 6 PM   cholecalciferol (VITAMIN D) 1000 units tablet Take 1,000 Units by mouth daily.   cyanocobalamin 1000 MCG tablet Take 2,000 mcg by mouth daily.    docusate sodium (COLACE) 100 MG capsule Take 100 mg by mouth 2 (two) times daily.    feeding supplement, ENSURE ENLIVE, (ENSURE ENLIVE) LIQD Take 237 mLs by mouth 3 (three) times daily between meals.   furosemide (LASIX) 40 MG tablet Take 0.5 tablets (20 mg total) by mouth daily.   lisinopril (ZESTRIL) 2.5 MG tablet Take 0.5 tablets (1.25 mg total) by mouth daily.   Multiple Vitamins-Minerals (ICAPS AREDS 2 PO) Take 1 tablet by mouth 2 (two) times daily.    nitroGLYCERIN (NITROSTAT) 0.4 MG SL tablet Place 1 tablet (0.4 mg total) under the tongue every 5 (five) minutes x 3 doses as needed for chest pain. Take only as needed for chest pain if your blood pressure is greater than 100/80.   No facility-administered encounter medications on file as of 11/15/2018.        Louis Colasurdo Jenetta Downer, NP

## 2018-11-16 ENCOUNTER — Telehealth: Payer: Self-pay | Admitting: Adult Health Nurse Practitioner

## 2018-11-16 NOTE — Telephone Encounter (Signed)
Spoke w/pt, he is aware, agreeable and verbalizes understanding, med list updated

## 2018-11-16 NOTE — Telephone Encounter (Signed)
Patient left VM about his lab results.  Returned call. Had received copy of lab results from his PCP.  Labs not overly concerning other than BUN and Creatinine being slightly elevated.  Encouraged patient to drink more water.  Will follow up next week. Cyerra Yim K. Olena Heckle NP

## 2018-11-18 ENCOUNTER — Other Ambulatory Visit (HOSPITAL_COMMUNITY): Payer: Self-pay | Admitting: Internal Medicine

## 2018-11-21 ENCOUNTER — Other Ambulatory Visit: Payer: Self-pay

## 2018-11-21 ENCOUNTER — Ambulatory Visit (HOSPITAL_COMMUNITY)
Admission: RE | Admit: 2018-11-21 | Discharge: 2018-11-21 | Disposition: A | Discharge status: Home / Self Care | Payer: Medicare Other | Source: Ambulatory Visit | Attending: Internal Medicine | Admitting: Internal Medicine

## 2018-11-21 DIAGNOSIS — I2510 Atherosclerotic heart disease of native coronary artery without angina pectoris: Secondary | ICD-10-CM

## 2018-11-21 DIAGNOSIS — I5022 Chronic systolic (congestive) heart failure: Secondary | ICD-10-CM | POA: Diagnosis not present

## 2018-11-21 DIAGNOSIS — I480 Paroxysmal atrial fibrillation: Secondary | ICD-10-CM

## 2018-11-21 NOTE — Addendum Note (Signed)
Encounter addended by: Valeda Malm, RN on: 11/21/2018 11:41 AM  Actions taken: Clinical Note Signed

## 2018-11-21 NOTE — Progress Notes (Signed)
Heart Failure TeleHealth Note  Due to national recommendations of social distancing due to [place] 19, Audio/video telehealth visit is felt to be most appropriate for this patient at this time.  See MyChart message from today for patient consent regarding telehealth for [facility].  Date:  11/21/2018   ID:  Louis Reid, DOB [birth year], MRN [id number]  Location: Home  Provider location: [facility] Type of Visit: Established patient  PCP:  [name], MD  Cardiologist:  No primary care provider on file. Primary HF: [place]  Chief Complaint: Heart Failure follow-up   History of Present Illness:  Louis Reid is a 85 y.o. male with PMH of CAD s/p CABG in 1996 with STEMI in 05/2012 and cath showing patent SVG-OM1-OM2 and SVG-D1 with occluded SVG-RCA and atretic LIMA-LAD with thrombectomy of proximal LAD occlusion, no significant changes on cath in 01/2017), HTN, HLD, ICM (s/p ICD placement in 2014), and chronic combined systolic and diastolic CHF.   Seen in [facility] clinic 01/10/2018 with multiple complaints. This included fatigue and DOE. He had stopped lisinopril due to hypotension.  Repeat echo ordered as below which showed decrease in EF, so referred to HF clinic by Jory Sims for HF evaluation & med optimization.    Admitted from clinic 02/28/18 with concerns for low output HF. RHC as below showed low filling pressures and mildly reduced CO. Given gentle IVF. Palliative consulted as pt refused milrinone. Ultimately, decided for home with hospice with ICD on for now. Started on Eliquis for new Afib.  He continues with palliative care after graduating hospice.  Last week, Amy, NP w/Palliative Care called to let us know pt was c/o increased fatigue and weakness.  Denied any other issues and felt it was from his medications.  He was only on Lasix 20 mg daily and Lisinopril 2.5 mg daily. Lisinopril stopped.   He presents via Therapist, music for a telehealth visit today. Says he is still blessed but not trouble free. Remains weak and tired.   Denies any pain. Only SOB if he tries to exert himself. Can do basic ADLs but not much else. About a month ago had a bunch of edema. But increased lasix and now improved. Remains on lasix 20 daily and swelling is well controlled. No orthopnea or PND. Weight stable at 130-133.   Had televisit with Dr. Rayann Heman in [place] recently but unable to follow his device remotely with bad cell phone service in [place].     Louis Reid denies symptoms worrisome for COVID 19.   Past Medical History:  Diagnosis Date  . AICD (automatic cardioverter/defibrillator) present 09/13/2012  . Anemia   . CAD (coronary artery disease)    a. s/p remote CABG, b. AL STEMI c/b VF arrest in 05/2012 => LHC 05/10/12: Severe 3v CAD, S-OM1/2 ok, SVG-Dx ok, SVG-RCA occluded, LIMA-LAD atretic, proximal LAD occluded. EF 20% with anterior apical HK=> salvage PCI with POBA and thrombectomy of the occluded LAD;  c. 01/2017: atretic LIMA_LAD and known occluded SVG-RCA. Patent LAD stent and 70% proxRCA stenosis (FFR 0.88).   . Chronic systolic heart failure ([place])    a. Echo 12/13: EF 20-25%, anteroseptal, inferior, apical HK, mildly reduced RV function, trivial pericardial effusion;   b.  Echo 3/14:  Ant, septal, apical AK, EF 25%  . Clotting disorder ([place])   . Depression    "years ago" (02/28/2018)  . History of kidney stones   . HLD (hyperlipidemia)   .  Hypertension    "I've never had high blood pressure; on RX for other reasons" (02/28/2018)  . Inguinal hernia    "have one now on my left side" (09/13/2012); (02/28/2018)  . Ischemic cardiomyopathy   . Myocardial infarction [facility]) 1996;  05/2012  . Osteoporosis   . Paroxysmal atrial fibrillation ([place]) 03/17/2016   noted on device interrogation,  will follow burden  . Pectus excavatum   . Pulmonary embolism ([place])    a. after adhesiolysis for SBO in 04/2012 => coumadin for 6  mos  . Renal cyst   . SBO (small bowel obstruction) [facility]) November 2013   s/p lysis of adhesions  . Sinoatrial node dysfunction ([facility])   . Skin cancer    "left ear; face" (02/28/2018)  . Ventricular fibrillation ([place]) 05/2012   . in setting of AL STEMI 12/13 => EF 20-25% => d/c on LifeVest (repeat echo planned in 08/2012)   Past Surgical History:  Procedure Laterality Date  . CATARACT EXTRACTION W/ INTRAOCULAR LENS  IMPLANT, BILATERAL Bilateral   . COLONOSCOPY    . CORONARY ANGIOPLASTY WITH STENT PLACEMENT     "I've got 2 or 3 stents" (02/28/2018)  . CORONARY ARTERY BYPASS GRAFT  1996   "CABG X5" (09/13/2012)  . CYSTOSCOPY/RETROGRADE/URETEROSCOPY/STONE EXTRACTION WITH BASKET  1990's  . ESOPHAGOGASTRODUODENOSCOPY  05/08/2012   Procedure: ESOPHAGOGASTRODUODENOSCOPY (EGD);  Surgeon: Juanita Craver, MD;  Location: [facility] ENDOSCOPY;  Service: Endoscopy;  Laterality: N/A;  Nevin Bloodgood put on for Dr. Collene Mares , Dr. Collene Mares will call if she wants another time  . IMPLANTABLE CARDIOVERTER DEFIBRILLATOR IMPLANT N/A 09/13/2012   SJM Ellipse ER implanted by Dr Rayann Heman for secondary prevention  . INGUINAL HERNIA REPAIR Right 1996  . INTRAVASCULAR PRESSURE WIRE/FFR STUDY N/A 01/20/2017   Procedure: INTRAVASCULAR PRESSURE WIRE/FFR STUDY;  Surgeon: Wellington Hampshire, MD;  Location: [facility] CV LAB;  Service: Cardiovascular;  Laterality: N/A;  . LAPAROTOMY  04/27/2012   Procedure: EXPLORATORY LAPAROTOMY;  Surgeon: Gwenyth Ober, MD;  Location: [facility];  Service: General;  Laterality: N/A;  . LEFT HEART CATH AND CORS/GRAFTS ANGIOGRAPHY N/A 01/20/2017   Procedure: LEFT HEART CATH AND CORS/GRAFTS ANGIOGRAPHY;  Surgeon: Wellington Hampshire, MD;  Location: [facility];  Service: Cardiovascular;  Laterality: N/A;  . LEFT HEART CATHETERIZATION WITH CORONARY ANGIOGRAM N/A 05/10/2012   Procedure: LEFT HEART CATHETERIZATION WITH CORONARY ANGIOGRAM;  Surgeon: Burnell Blanks, MD;  Location: [facility] CATH LAB;  Service: Cardiovascular;   Laterality: N/A;  . PTCA     percutaneous transluminal coronary intervention and brachy therapy, Bruce R. Olevia Perches, MD. EF 60%  . RIGHT HEART CATH N/A 03/01/2018   Procedure: RIGHT HEART CATH;  Surgeon: Jolaine Artist, MD;  Location: [facility] CV LAB;  Service: Cardiovascular;  Laterality: N/A;  . SKIN CANCER EXCISION     "left ear; face" (02/28/2018)  . TRANSURETHRAL RESECTION OF PROSTATE       Current Outpatient Medications  Medication Sig Dispense Refill  . apixaban (ELIQUIS) 2.5 MG TABS tablet Take 1 tablet (2.5 mg total) by mouth 2 (two) times daily. 180 tablet 1  . atorvastatin (LIPITOR) 20 MG tablet TAKE 1 TABLET BY MOUTH  DAILY AT 6 PM 90 tablet 2  . cholecalciferol (VITAMIN D) 1000 units tablet Take 1,000 Units by mouth daily.    . cyanocobalamin 1000 MCG tablet Take 2,000 mcg by mouth daily.     Marland Kitchen docusate sodium (COLACE) 100 MG capsule Take 100 mg by mouth 2 (two) times daily.     Marland Kitchen  feeding supplement, ENSURE ENLIVE, (ENSURE ENLIVE) LIQD Take 237 mLs by mouth 3 (three) times daily between meals. 237 mL 12  . furosemide (LASIX) 40 MG tablet Take 0.5 tablets (20 mg total) by mouth daily. 90 tablet 3  . Multiple Vitamins-Minerals (ICAPS AREDS 2 PO) Take 1 tablet by mouth 2 (two) times daily.     . nitroGLYCERIN (NITROSTAT) 0.4 MG SL tablet Place 1 tablet (0.4 mg total) under the tongue every 5 (five) minutes x 3 doses as needed for chest pain. Take only as needed for chest pain if your blood pressure is greater than 100/80. 25 tablet 12   No current facility-administered medications for this encounter.     Allergies:   Patient has no known allergies.   Social History:  The patient  reports that he has quit smoking. His smoking use included cigarettes. He has never used smokeless tobacco. He reports that he does not drink alcohol or use drugs.   Family History:  The patient's family history includes Cancer in his brother; Colon cancer in his brother; Heart disease in his  brother and father; Other in his mother.   ROS:  Please see the history of present illness.   All other systems are personally reviewed and negative.   Exam:  (Video/Tele Health Call; Exam is subjective and or/visual.) General:  Speaks in full sentences. No resp difficulty. Lungs: Normal respiratory effort with conversation.  Abdomen: Non-distended per patient report Extremities: Pt denies edema. Neuro: Alert & oriented x 3.   Recent Labs: 03/01/2018: Hemoglobin 13.3; Platelets 120 04/18/2018: B Natriuretic Peptide 282.8; BUN 21; Creatinine, Ser 1.29; Potassium 4.3; Sodium 140  Personally reviewed   Wt Readings from Last 3 Encounters:  10/03/18 58.5 kg (129 lb)  08/08/18 62.1 kg (137 lb)  06/13/18 60.2 kg (132 lb 12.8 oz)      ASSESSMENT AND PLAN:  1. Chronic systolic CHF, ICM - Echo 5/00/93 LVEF 15%, Grade 1 DD, Trivial MR, Mildly reduced RV function, PA peak pressure 32 mm Hg.  - s/p [employer] ICD. He wants to keep tachytherapies on for now. - EF dropped from 20-25% ->15% from 01/2017 - 2019.  - RHC 03/01/18 with low filling pressures and mildly reduced CO. - Mildly progressive NYHA IIIB - IV symptoms  - Volume status stable by weight and symptoms. Suspect low output.  - He is not a candidate for VAD or transplant. - We had long discussion about possibility of repeat RHC and palliative milrinone. He wants to hold off for now. We will revisit in several weeks. Will also check CBC to make sure he is not anemic - Continue lasix 20 mg daily for now.  - No BB with for low output.  - Lisinopril stopped last week due to low BP and fatigue  - Followed by [facility] > now graduated to palliative.   2. CAD s/p CABG - Most recent cath 01/2017 with patent SVG to diagonal, patent SVG to OM 2, and OM 3. Patent proximal LAD stent. LIMA to LAD and SVG to RCA known occluded.  - No s/s of ischemia.    - Continue statin.  - BB stopped with concern for low output.No ASA now that he  is on Eliquis.   3. PAF - <1% burden by ICD interrogation.  -Continueeliquis 2.5 mg BID. Denies bleeding. Check CBC.   4. HLD - Continue statin. No change.   COVID screen The patient does not have any symptoms that suggest any further  testing/ screening at this time.  Social distancing reinforced today.  Recommended follow-up:  As above  Relevant cardiac medications were reviewed at length with the patient today.   The patient does not have concerns regarding their medications at this time.   The following changes were made today:  As above  Today, I have spent 18 minutes with the patient with telehealth technology discussing the above issues .    Signed, Glori Bickers, MD  11/21/2018 9:29 AM  Advanced Heart Failure [place] [address] [phone] (office) [phone] (fax)

## 2018-11-21 NOTE — Progress Notes (Addendum)
Spoke with patient, discussed AVS below:  You can stop Atorvastatin if you would like.   Labs ordered with [facility]. (Order faxed for BNP and CBC) They will draw this at your next visit with them. We will only contact you if something comes back abnormal or we need to make some changes. Otherwise no news is good news!  Your physician recommends that you schedule a follow-up appointment in: 4-6 weeks with Dr Haroldine Laws as a telehealth visit.  You will get a call to schedule this appointment.    Verbalized understanding. Pt will get a call to schedule his f/u. Message sent to scheduler. AVS mailed.

## 2018-11-21 NOTE — Patient Instructions (Addendum)
You can stop Atorvastatin if you would like.   Labs ordered with [facility]. They will draw this at your next visit with them. We will only contact you if something comes back abnormal or we need to make some changes. Otherwise no news is good news!  Your physician recommends that you schedule a follow-up appointment in: 4-6 weeks with Dr Haroldine Laws as a telehealth visit.  You will get a call to schedule this appointment.   At the [facility], you and your health needs are our priority. As part of our continuing mission to provide you with exceptional heart care, we have created designated Provider Care Teams. These Care Teams include your primary Cardiologist (physician) and Advanced Practice Providers (APPs- Physician Assistants and Nurse Practitioners) who all work together to provide you with the care you need, when you need it.   You may see any of the following providers on your designated Care Team at your next follow up: Marland Kitchen Dr Glori Bickers . Dr Loralie Champagne . Darrick Grinder, NP

## 2018-11-22 ENCOUNTER — Telehealth (HOSPITAL_COMMUNITY): Payer: Self-pay

## 2018-11-22 NOTE — Telephone Encounter (Signed)
Pt does not need any additional labs (see AVS from yesterday). Pt had labs on 6/9.  MD reviewed labs and per MD, pt does not need any additional labs.  Pt aware of same and verbalized appreciation.

## 2018-11-23 ENCOUNTER — Other Ambulatory Visit: Payer: Self-pay

## 2018-11-23 ENCOUNTER — Other Ambulatory Visit: Payer: Medicare Other | Admitting: Adult Health Nurse Practitioner

## 2018-11-23 DIAGNOSIS — Z515 Encounter for palliative care: Secondary | ICD-10-CM

## 2018-11-23 NOTE — Progress Notes (Signed)
[facility] Consult Note Telephone: [phone]  Fax: [phone]  PATIENT NAME: Louis Reid DOB: 29-Oct-1932 MRN: [id number]  PRIMARY CARE PROVIDER:   [name], MD  REFERRING PROVIDER:  [name], MD [place],  [place] [address]  RESPONSIBLE PARTY:   Extended Emergency Contact Information Primary Emergency Contact: Knouff,[name] Address: [address] [place], [place] [address] [place] Mobile Phone: [phone] Relation: Son  Due to the COVID-19 crisis, this visit was done via telemedicine and it was initiated and consent by this patient and or family. Video-audio (telehealth) contact was unable to be done due technical barriers from the patient's side        RECOMMENDATIONS and PLAN:  1.  Fatigue and weakness.  Patient states that this is improving.  He is trying to stay hydrated without causing any increased edema.  Does state that Dr. Jeffie Pollock has discontinued his lisinopril and atorvastatin in case these were causing some of his fatigue and weakness.  Blood work was over unremarkable with BUN and creatinine just slightly above normal but not concerning.    2. Edema.  Denies any increased edema and only wears his TED hose when he has increased swelling.  Denies any SOB, cough, chest pain, fever.  Has stable DOE.  3.  Goals of care.  With the increased weakness and fatigue he was trying and still wants to be able to all he can at home and to stay away from going to the hospital.  Patient is full code.  Have in person appointment set up in 2 weeks to go over ACP  I spent 30 minutes providing this consultation,  from 10:30 to 11:00. More than 50% of the time in this consultation was spent coordinating communication.   HISTORY OF PRESENT ILLNESS:  Louis Reid is a 85 y.o. year old male with multiple medical problems including CAD, cardiomyopathy,  unstable angina, HFrEF, edema. Palliative Care was asked to help address goals of care.   CODE STATUS: Full Code  PPS: 60% HOSPICE ELIGIBILITY/DIAGNOSIS: TBD  PAST MEDICAL HISTORY:  Past Medical History:  Diagnosis Date  . AICD (automatic cardioverter/defibrillator) present 09/13/2012  . Anemia   . CAD (coronary artery disease)    a. s/p remote CABG, b. AL STEMI c/b VF arrest in 05/2012 => LHC 05/10/12: Severe 3v CAD, S-OM1/2 ok, SVG-Dx ok, SVG-RCA occluded, LIMA-LAD atretic, proximal LAD occluded. EF 20% with anterior apical HK=> salvage PCI with POBA and thrombectomy of the occluded LAD;  c. 01/2017: atretic LIMA_LAD and known occluded SVG-RCA. Patent LAD stent and 70% proxRCA stenosis (FFR 0.88).   . Chronic systolic heart failure ([place])    a. Echo 12/13: EF 20-25%, anteroseptal, inferior, apical HK, mildly reduced RV function, trivial pericardial effusion;   b.  Echo 3/14:  Ant, septal, apical AK, EF 25%  . Clotting disorder ([place])   . Depression    "years ago" (02/28/2018)  . History of kidney stones   . HLD (hyperlipidemia)   . Hypertension    "I've never had high blood pressure; on RX for other reasons" (02/28/2018)  . Inguinal hernia    "have one now on my left side" (09/13/2012); (02/28/2018)  . Ischemic cardiomyopathy   . Myocardial infarction [facility]) 1996;  05/2012  . Osteoporosis   . Paroxysmal atrial fibrillation ([place]) 03/17/2016   noted on device interrogation,  will follow burden  . Pectus excavatum   .  Pulmonary embolism ([place])    a. after adhesiolysis for SBO in 04/2012 => coumadin for 6 mos  . Renal cyst   . SBO (small bowel obstruction) [facility]) November 2013   s/p lysis of adhesions  . Sinoatrial node dysfunction (HCC)   . Skin cancer    "left ear; face" (02/28/2018)  . Ventricular fibrillation ([place]) 05/2012   . in setting of AL STEMI 12/13 => EF 20-25% => d/c on LifeVest (repeat echo planned in 08/2012)    SOCIAL HX:  Social History   Tobacco Use  . Smoking status:  Former Smoker    Types: Cigarettes  . Smokeless tobacco: Never Used  . Tobacco comment: 09/14/2011; 02/28/2018  "quit smoking when I was ~ 17; probably didn't smoke 10 packs in my life"  Substance Use Topics  . Alcohol use: Never    Frequency: Never    ALLERGIES: No Known Allergies   PERTINENT MEDICATIONS:  Outpatient Encounter Medications as of 11/23/2018  Medication Sig  . apixaban (ELIQUIS) 2.5 MG TABS tablet Take 1 tablet (2.5 mg total) by mouth 2 (two) times daily.  Marland Kitchen atorvastatin (LIPITOR) 20 MG tablet TAKE 1 TABLET BY MOUTH  DAILY AT 6 PM  . cholecalciferol (VITAMIN D) 1000 units tablet Take 1,000 Units by mouth daily.  . cyanocobalamin 1000 MCG tablet Take 2,000 mcg by mouth daily.   Marland Kitchen docusate sodium (COLACE) 100 MG capsule Take 100 mg by mouth 2 (two) times daily.   . feeding supplement, ENSURE ENLIVE, (ENSURE ENLIVE) LIQD Take 237 mLs by mouth 3 (three) times daily between meals.  . furosemide (LASIX) 40 MG tablet Take 0.5 tablets (20 mg total) by mouth daily.  . Multiple Vitamins-Minerals (ICAPS AREDS 2 PO) Take 1 tablet by mouth 2 (two) times daily.   . nitroGLYCERIN (NITROSTAT) 0.4 MG SL tablet Place 1 tablet (0.4 mg total) under the tongue every 5 (five) minutes x 3 doses as needed for chest pain. Take only as needed for chest pain if your blood pressure is greater than 100/80.   No facility-administered encounter medications on file as of 11/23/2018.      Tereso Unangst Jenetta Downer, NP

## 2018-12-06 ENCOUNTER — Telehealth: Payer: Self-pay | Admitting: *Deleted

## 2018-12-06 ENCOUNTER — Other Ambulatory Visit: Payer: Medicare Other | Admitting: Adult Health Nurse Practitioner

## 2018-12-06 ENCOUNTER — Other Ambulatory Visit: Payer: Self-pay

## 2018-12-06 DIAGNOSIS — Z515 Encounter for palliative care: Secondary | ICD-10-CM

## 2018-12-06 NOTE — Telephone Encounter (Signed)
-----   Message from Thompson Grayer, MD sent at 10/03/2018  3:03 PM EDT ----- Patient is aware that we are deferring further device checks until after COVID 19.  He cannot do remotes due to poor cell reception where he lives.

## 2018-12-06 NOTE — Progress Notes (Signed)
[facility] Consult Note Telephone: [phone]  Fax: [phone]  PATIENT NAME: Louis Reid DOB: July 18, 1932 MRN: [id number]  PRIMARY CARE PROVIDER:   [name], MD  REFERRING PROVIDER:  [name], MD [place],  [place] [address]  RESPONSIBLE PARTY:   Extended Emergency Contact Information Primary Emergency Contact: Stump,[name] Address: [address] [place], [place] [address] [place] Mobile Phone: [phone] Relation: Son    RECOMMENDATIONS and PLAN:  1.  Fatigue and weakness.  Patient states that he still has fatigue and weakness but does not feel as if it has gotten worse.  Lisinopril and atorvastatin were stopped a couple weeks ago.  Have encouraged him to increase how much water he drinks.  He states that he drinks just a few sips at a time and admits he does not get enough water.  Have encouraged him to drink 1-2 cups per day to start and to slowly go up to 3-4 cups per day.  Discussed with his CHF that he wouldn't want to go higher than that. Also discussed that he is having some stress related to current events and his son is out of work and what he can do to help.  States that he has been having problems falling asleep but will have a small bowl of cereal around 1am and this makes him sleepy and he will sleep for about 5 hours afterwards. Suggested having the milk before going to bed to see if this helps.  Also went over coping mechanisms to distract from the things that stress him.  He enjoys working with displays and reading his Bible.  All of these are things that could be making him more tired and weak.  2.  Edema.  Denies any increased edema and only wears his TED hose when he has increased swelling.  Denies any SOB, cough, chest pain, fever.  Has stable DOE.  3.  Goals of care.  Filled out MOST.  Wants to have everything done to prolong life. Full  code, full interventions, IV fluids and antibiotics as indicated, feeding tube for a trial period.  He had filled out the form in February but it wasn't signed by a provider.  He did not want to make any changes. Signed it today and uploaded to [facility].   I spent 50 minutes providing this consultation,  from 9:50 to 10:40. More than 50% of the time in this consultation was spent coordinating communication.   HISTORY OF PRESENT ILLNESS:  LEVERNE AMRHEIN is a 85 y.o. year old male with multiple medical problems including CAD, cardiomyopathy, unstable angina, HFrEF, edema. Palliative Care was asked to help address goals of care.   CODE STATUS: full Code  PPS: 60% HOSPICE ELIGIBILITY/DIAGNOSIS: TBD  PHYSICAL EXAM:   General: NAD, frail appearing, thin Extremities: no edema, no joint deformities Skin: no rashes Neurological: Weakness but otherwise nonfocal; does admit to some forgerfulness   PAST MEDICAL HISTORY:  Past Medical History:  Diagnosis Date  . AICD (automatic cardioverter/defibrillator) present 09/13/2012  . Anemia   . CAD (coronary artery disease)    a. s/p remote CABG, b. AL STEMI c/b VF arrest in 05/2012 => LHC 05/10/12: Severe 3v CAD, S-OM1/2 ok, SVG-Dx ok, SVG-RCA occluded, LIMA-LAD atretic, proximal LAD occluded. EF 20% with anterior apical HK=> salvage PCI with POBA and thrombectomy of the occluded LAD;  c. 01/2017: atretic LIMA_LAD and known occluded SVG-RCA. Patent  LAD stent and 70% proxRCA stenosis (FFR 0.88).   . Chronic systolic heart failure ([place])    a. Echo 12/13: EF 20-25%, anteroseptal, inferior, apical HK, mildly reduced RV function, trivial pericardial effusion;   b.  Echo 3/14:  Ant, septal, apical AK, EF 25%  . Clotting disorder ([place])   . Depression    "years ago" (02/28/2018)  . History of kidney stones   . HLD (hyperlipidemia)   . Hypertension    "I've never had high blood pressure; on RX for other reasons" (02/28/2018)  . Inguinal hernia    "have one now on  my left side" (09/13/2012); (02/28/2018)  . Ischemic cardiomyopathy   . Myocardial infarction [facility]) 1996;  05/2012  . Osteoporosis   . Paroxysmal atrial fibrillation ([place]) 03/17/2016   noted on device interrogation,  will follow burden  . Pectus excavatum   . Pulmonary embolism ([place])    a. after adhesiolysis for SBO in 04/2012 => coumadin for 6 mos  . Renal cyst   . SBO (small bowel obstruction) [facility]) November 2013   s/p lysis of adhesions  . Sinoatrial node dysfunction (HCC)   . Skin cancer    "left ear; face" (02/28/2018)  . Ventricular fibrillation ([place]) 05/2012   . in setting of AL STEMI 12/13 => EF 20-25% => d/c on LifeVest (repeat echo planned in 08/2012)    SOCIAL HX:  Social History   Tobacco Use  . Smoking status: Former Smoker    Types: Cigarettes  . Smokeless tobacco: Never Used  . Tobacco comment: 09/14/2011; 02/28/2018  "quit smoking when I was ~ 17; probably didn't smoke 10 packs in my life"  Substance Use Topics  . Alcohol use: Never    Frequency: Never    ALLERGIES: No Known Allergies   PERTINENT MEDICATIONS:  Outpatient Encounter Medications as of 12/06/2018  Medication Sig  . apixaban (ELIQUIS) 2.5 MG TABS tablet Take 1 tablet (2.5 mg total) by mouth 2 (two) times daily.  Marland Kitchen atorvastatin (LIPITOR) 20 MG tablet TAKE 1 TABLET BY MOUTH  DAILY AT 6 PM  . cholecalciferol (VITAMIN D) 1000 units tablet Take 1,000 Units by mouth daily.  . cyanocobalamin 1000 MCG tablet Take 2,000 mcg by mouth daily.   Marland Kitchen docusate sodium (COLACE) 100 MG capsule Take 100 mg by mouth 2 (two) times daily.   . feeding supplement, ENSURE ENLIVE, (ENSURE ENLIVE) LIQD Take 237 mLs by mouth 3 (three) times daily between meals.  . furosemide (LASIX) 40 MG tablet Take 0.5 tablets (20 mg total) by mouth daily.  . Multiple Vitamins-Minerals (ICAPS AREDS 2 PO) Take 1 tablet by mouth 2 (two) times daily.   . nitroGLYCERIN (NITROSTAT) 0.4 MG SL tablet Place 1 tablet (0.4 mg total) under the tongue every 5  (five) minutes x 3 doses as needed for chest pain. Take only as needed for chest pain if your blood pressure is greater than 100/80.   No facility-administered encounter medications on file as of 12/06/2018.      Amy Jenetta Downer, NP

## 2018-12-06 NOTE — Telephone Encounter (Signed)
Spoke with patient to schedule DC appointment for ICD check. Pt is agreeable to an appointment on Tuesday, 12/13/18 at 8:30am in the DC. He denies additional questions or concerns at this time.      COVID-19 Pre-Screening Questions:  . In the past 7 to 10 days have you had a cough,  shortness of breath, headache, congestion, fever (100 or greater) body aches, chills, sore throat, or sudden loss of taste or sense of smell? . Have you been around anyone with known Covid 19? Marland Kitchen Have you been around anyone who is awaiting Covid 19 test results in the past 7 to 10 days? . Have you been around anyone who has been exposed to Covid 19, or has mentioned symptoms of Covid 19 within the past 7 to 10 days?   Pt answered "no" to all questions. He is aware to wear his own mask to this appointment.

## 2018-12-12 ENCOUNTER — Other Ambulatory Visit: Payer: Medicare Other | Admitting: Adult Health Nurse Practitioner

## 2018-12-12 ENCOUNTER — Telehealth: Payer: Self-pay | Admitting: Internal Medicine

## 2018-12-12 ENCOUNTER — Other Ambulatory Visit: Payer: Self-pay

## 2018-12-12 DIAGNOSIS — Z515 Encounter for palliative care: Secondary | ICD-10-CM

## 2018-12-12 NOTE — Progress Notes (Signed)
[facility] Consult Note Telephone: [phone]  Fax: [phone]  PATIENT NAME: Louis Reid DOB: December 09, 1932 MRN: [id number]  PRIMARY CARE PROVIDER:   [name], MD  REFERRING PROVIDER:  [name], MD [place],  [place] [address]  RESPONSIBLE PARTY:   Extended Emergency Contact Information Primary Emergency Contact: Hayton,[name] Address: [address] [place], [place] [address] [place] Mobile Phone: [phone] Relation: Son  Due to the COVID-19 crisis, this visit was done via telemedicine and it was initiated and consent by this patient and or family. Video-audio (telehealth) contact was unable to be done due technical barriers from the patient's side      RECOMMENDATIONS and PLAN:  Patient called about his appointment to his device clinic to have his defibrillator checked.  States that he has been trying to call and has been hold for a while each time he calls.  Concerned about the advantages compared to the disadvantages of going in to the appointment in light of COVID.  Due to him living in a dead zone he does have to go in to have his device checked.  Have gone over his symptoms and he has been having increased fatigue.  Denies increased SOB, cough, heart palpitations, chest pain.  Have encouraged him to go in to have his device checked due to his increased fatigue.  I called the office and was able to get a hold of someone who went over protocol during [facility] and she said she would give him a call and go over the protocol with him.  He did call back and state that someone went over the COVID protocol and that he will keep his appointment for tomorrow.  I spent 20 minutes providing this consultation,  from 10:30 to 10:50. More than 50% of the time in this consultation was spent coordinating communication.   HISTORY OF PRESENT ILLNESS:  Louis Reid is a  85 y.o. year old male with multiple medical problems including CAD, cardiomyopathy, unstable angina, HFrEF, edema. Palliative Care was asked to help address goals of care.   CODE STATUS: Full Code  PPS: 60% HOSPICE ELIGIBILITY/DIAGNOSIS: TBD  PAST MEDICAL HISTORY:  Past Medical History:  Diagnosis Date  . AICD (automatic cardioverter/defibrillator) present 09/13/2012  . Anemia   . CAD (coronary artery disease)    a. s/p remote CABG, b. AL STEMI c/b VF arrest in 05/2012 => LHC 05/10/12: Severe 3v CAD, S-OM1/2 ok, SVG-Dx ok, SVG-RCA occluded, LIMA-LAD atretic, proximal LAD occluded. EF 20% with anterior apical HK=> salvage PCI with POBA and thrombectomy of the occluded LAD;  c. 01/2017: atretic LIMA_LAD and known occluded SVG-RCA. Patent LAD stent and 70% proxRCA stenosis (FFR 0.88).   . Chronic systolic heart failure ([place])    a. Echo 12/13: EF 20-25%, anteroseptal, inferior, apical HK, mildly reduced RV function, trivial pericardial effusion;   b.  Echo 3/14:  Ant, septal, apical AK, EF 25%  . Clotting disorder ([place])   . Depression    "years ago" (02/28/2018)  . History of kidney stones   . HLD (hyperlipidemia)   . Hypertension    "I've never had high blood pressure; on RX for other reasons" (02/28/2018)  . Inguinal hernia    "have one now on my left side" (09/13/2012); (02/28/2018)  . Ischemic cardiomyopathy   . Myocardial infarction [facility]) 1996;  05/2012  . Osteoporosis   . Paroxysmal atrial fibrillation ([place]) 03/17/2016  noted on device interrogation,  will follow burden  . Pectus excavatum   . Pulmonary embolism ([place])    a. after adhesiolysis for SBO in 04/2012 => coumadin for 6 mos  . Renal cyst   . SBO (small bowel obstruction) [facility]) November 2013   s/p lysis of adhesions  . Sinoatrial node dysfunction (HCC)   . Skin cancer    "left ear; face" (02/28/2018)  . Ventricular fibrillation ([place]) 05/2012   . in setting of AL STEMI 12/13 => EF 20-25% => d/c on LifeVest (repeat echo  planned in 08/2012)    SOCIAL HX:  Social History   Tobacco Use  . Smoking status: Former Smoker    Types: Cigarettes  . Smokeless tobacco: Never Used  . Tobacco comment: 09/14/2011; 02/28/2018  "quit smoking when I was ~ 17; probably didn't smoke 10 packs in my life"  Substance Use Topics  . Alcohol use: Never    Frequency: Never    ALLERGIES: No Known Allergies   PERTINENT MEDICATIONS:  Outpatient Encounter Medications as of 12/12/2018  Medication Sig  . apixaban (ELIQUIS) 2.5 MG TABS tablet Take 1 tablet (2.5 mg total) by mouth 2 (two) times daily.  Marland Kitchen atorvastatin (LIPITOR) 20 MG tablet TAKE 1 TABLET BY MOUTH  DAILY AT 6 PM  . cholecalciferol (VITAMIN D) 1000 units tablet Take 1,000 Units by mouth daily.  . cyanocobalamin 1000 MCG tablet Take 2,000 mcg by mouth daily.   Marland Kitchen docusate sodium (COLACE) 100 MG capsule Take 100 mg by mouth 2 (two) times daily.   . feeding supplement, ENSURE ENLIVE, (ENSURE ENLIVE) LIQD Take 237 mLs by mouth 3 (three) times daily between meals.  . furosemide (LASIX) 40 MG tablet Take 0.5 tablets (20 mg total) by mouth daily.  . Multiple Vitamins-Minerals (ICAPS AREDS 2 PO) Take 1 tablet by mouth 2 (two) times daily.   . nitroGLYCERIN (NITROSTAT) 0.4 MG SL tablet Place 1 tablet (0.4 mg total) under the tongue every 5 (five) minutes x 3 doses as needed for chest pain. Take only as needed for chest pain if your blood pressure is greater than 100/80.   No facility-administered encounter medications on file as of 12/12/2018.       Jenetta Downer, NP

## 2018-12-12 NOTE — Telephone Encounter (Signed)
New Message         COVID-19 Pre-Screening Questions:   In the past 7 to 10 days have you had a cough,  shortness of breath, headache, congestion, fever (100 or greater) body aches, chills, sore throat, or sudden loss of taste or sense of smell? Taste problem, but he says it is from medication and is nothing new and fatigue   Have you been around anyone with known Covid 19. NO  Have you been around anyone who is awaiting Covid 19 test results in the past 7 to 10 days? NO  Have you been around anyone who has been exposed to Covid 19, or has mentioned symptoms of Covid 19 within the past 7 to 10 days? NO  If you have any concerns/questions about symptoms patients report during screening (either on the phone or at threshold). Contact the provider seeing the patient or DOD for further guidance.  If neither are available contact a member of the leadership team.

## 2018-12-13 ENCOUNTER — Ambulatory Visit (INDEPENDENT_AMBULATORY_CARE_PROVIDER_SITE_OTHER): Payer: Medicare Other | Admitting: *Deleted

## 2018-12-13 ENCOUNTER — Other Ambulatory Visit: Payer: Self-pay

## 2018-12-13 DIAGNOSIS — I5042 Chronic combined systolic (congestive) and diastolic (congestive) heart failure: Secondary | ICD-10-CM | POA: Diagnosis not present

## 2018-12-13 LAB — CUP PACEART INCLINIC DEVICE CHECK
Battery Remaining Longevity: 42 mo
Brady Statistic RA Percent Paced: 12 %
Brady Statistic RV Percent Paced: 0.43 %
Date Time Interrogation Session: 20200707105924
HighPow Impedance: 58.5 Ohm
Implantable Lead Implant Date: 20140408
Implantable Lead Implant Date: 20140408
Implantable Lead Location: 753859
Implantable Lead Location: 753860
Implantable Pulse Generator Implant Date: 20140408
Lead Channel Impedance Value: 337.5 Ohm
Lead Channel Impedance Value: 400 Ohm
Lead Channel Pacing Threshold Amplitude: 0.5 V
Lead Channel Pacing Threshold Amplitude: 0.5 V
Lead Channel Pacing Threshold Amplitude: 0.75 V
Lead Channel Pacing Threshold Amplitude: 0.75 V
Lead Channel Pacing Threshold Pulse Width: 0.5 ms
Lead Channel Pacing Threshold Pulse Width: 0.5 ms
Lead Channel Pacing Threshold Pulse Width: 0.5 ms
Lead Channel Pacing Threshold Pulse Width: 0.5 ms
Lead Channel Sensing Intrinsic Amplitude: 11.7 mV
Lead Channel Sensing Intrinsic Amplitude: 5 mV
Lead Channel Setting Pacing Amplitude: 2 V
Lead Channel Setting Pacing Amplitude: 2.5 V
Lead Channel Setting Pacing Pulse Width: 0.5 ms
Lead Channel Setting Sensing Sensitivity: 0.5 mV
Pulse Gen Serial Number: 7094248

## 2018-12-13 NOTE — Progress Notes (Signed)
ICD check in clinic. Normal device function. Thresholds and sensing consistent with previous device measurements. Impedance trends stable over time. 5 ventricular arrhythmia episodes, 1 available EGM from 06/02/18 @ 1032 shows NSVT, 4 sec duration. 243 AMS episodes (<1% burden); some EGMs show atrial lead noise, unable to reproduce with isometrics. Some available EGMs show AT + eliquis. Histogram distribution appropriate for patient and level of activity. No changes made this session. Device programmed at appropriate safety margins. Device programmed to optimize intrinsic conduction. Estimated longevity 2.9-3.5 years. Pt unable to enroll in remote f/u, plan to check device every 3 months in office, next ROV 03/14/19. Patient education completed including shock plan. Alert tones/vibration demonstrated for patient.

## 2018-12-26 ENCOUNTER — Telehealth: Payer: Self-pay | Admitting: Adult Health Nurse Practitioner

## 2018-12-26 ENCOUNTER — Other Ambulatory Visit (HOSPITAL_COMMUNITY): Payer: Self-pay | Admitting: *Deleted

## 2018-12-26 MED ORDER — FUROSEMIDE 20 MG PO TABS: 20.0000 mg | ORAL_TABLET | Freq: Every day | ORAL | 3 refills | Status: DC

## 2018-12-26 NOTE — Telephone Encounter (Signed)
Patient had called this AM and left message about his Lasix being sent in 40 mg tabs.  He is supposed to have Lasix 20 mg PO daily.  He has been cutting them in half but sometimes has trouble getting it split correctly.  States that [facility] won't change it without Dr. Clayborne Dana office sending the change to them.  Called Dr. Clayborne Dana office and spoke with [facility] and she said they would work on getting that taken care of for him.  Called patient back with update on conversation with Dr. Clayborne Dana office. Louis Reid K. Olena Heckle NP

## 2019-01-23 ENCOUNTER — Other Ambulatory Visit: Payer: Self-pay | Admitting: Cardiology

## 2019-01-23 NOTE — Telephone Encounter (Signed)
68M 59.2 kg, SCr 1.28 (11/2018), LOV JA 4/20020

## 2019-01-25 ENCOUNTER — Other Ambulatory Visit: Payer: Medicare Other | Admitting: Adult Health Nurse Practitioner

## 2019-01-25 ENCOUNTER — Other Ambulatory Visit: Payer: Self-pay

## 2019-01-25 DIAGNOSIS — Z515 Encounter for palliative care: Secondary | ICD-10-CM

## 2019-01-25 NOTE — Progress Notes (Signed)
[facility] Consult Note Telephone: [phone]  Fax: [phone]  PATIENT NAME: Louis Reid DOB: 1933/05/10 MRN: [id number]  PRIMARY CARE PROVIDER:   [name], MD  REFERRING PROVIDER:  [name], MD [place],  [place] [address]  RESPONSIBLE PARTY:   Extended Emergency Contact Information Primary Emergency Contact: Keahey,[name] Address: [address], [place] [address] [place] Mobile Phone: [phone] Relation: Son  Due to the COVID-19 crisis, this visit was done via telemedicine and it was initiated and consent by this patient and or family. Video-audio (telehealth) contact was unable to be done due technical barriers from the patient's side      RECOMMENDATIONS and PLAN:  1.  Advanced care planning.  Patient wishes to be a full code with full hospital interventions.  No changes made today.  Will have in person follow up next week to reevaluate below concerns.  Other than what is listed below, he has no other concerns.    2.  SOB and chest tightness.  Patient states that he has been having SOB which has been ongoing for a while now.  States that he gets out of breath when he walks to the mailbox and back and it takes a few minutes to catch his breath.  He is also complaining of chest tightness that mostly occurs in the morning and will ease off during the day.  States that he did read that chest pain and tightness is a side effect of his Eliquis.  Denies any nosebleeds, hematuria, dark tarry stools, or any other unusual bleeding or bruising.  Also states that he is stressed with the pandemic and every current world and national events, and has personal stressors.  States that his symptoms go away when he distracts himself with activities around the house.  Denies chest pain, fever, cough.  Did agree that this could be stress related as symptoms do go  away with distraction but due to his cardiac history he agreed for me to reach out to Dr. Haroldine Laws, his cardiologist, in case any further work up is warranted.  Did reach out to Dr. Haroldine Laws via Epic and will forward my note to him.    I spent 30 minutes providing this consultation,  from 10:50 to 11:20. More than 50% of the time in this consultation was spent coordinating communication.   HISTORY OF PRESENT ILLNESS:  Louis Reid is a 85 y.o. year old male with multiple medical problems including CAD, cardiomyopathy, unstable angina, HFrEF, edema. Palliative Care was asked to help address goals of care.   CODE STATUS: Full Code  PPS: 60% HOSPICE ELIGIBILITY/DIAGNOSIS: TBD  PAST MEDICAL HISTORY:  Past Medical History:  Diagnosis Date  . AICD (automatic cardioverter/defibrillator) present 09/13/2012  . Anemia   . CAD (coronary artery disease)    a. s/p remote CABG, b. AL STEMI c/b VF arrest in 05/2012 => LHC 05/10/12: Severe 3v CAD, S-OM1/2 ok, SVG-Dx ok, SVG-RCA occluded, LIMA-LAD atretic, proximal LAD occluded. EF 20% with anterior apical HK=> salvage PCI with POBA and thrombectomy of the occluded LAD;  c. 01/2017: atretic LIMA_LAD and known occluded SVG-RCA. Patent LAD stent and 70% proxRCA stenosis (FFR 0.88).   . Chronic systolic heart failure ([place])    a. Echo 12/13: EF 20-25%, anteroseptal, inferior, apical HK, mildly reduced RV function, trivial pericardial effusion;   b.  Echo 3/14:  Ant, septal, apical AK, EF 25%  .  Clotting disorder ([place])   . Depression    "years ago" (02/28/2018)  . History of kidney stones   . HLD (hyperlipidemia)   . Hypertension    "I've never had high blood pressure; on RX for other reasons" (02/28/2018)  . Inguinal hernia    "have one now on my left side" (09/13/2012); (02/28/2018)  . Ischemic cardiomyopathy   . Myocardial infarction [facility]) 1996;  05/2012  . Osteoporosis   . Paroxysmal atrial fibrillation ([place]) 03/17/2016   noted on device interrogation,   will follow burden  . Pectus excavatum   . Pulmonary embolism ([place])    a. after adhesiolysis for SBO in 04/2012 => coumadin for 6 mos  . Renal cyst   . SBO (small bowel obstruction) [facility]) November 2013   s/p lysis of adhesions  . Sinoatrial node dysfunction (HCC)   . Skin cancer    "left ear; face" (02/28/2018)  . Ventricular fibrillation ([place]) 05/2012   . in setting of AL STEMI 12/13 => EF 20-25% => d/c on LifeVest (repeat echo planned in 08/2012)    SOCIAL HX:  Social History   Tobacco Use  . Smoking status: Former Smoker    Types: Cigarettes  . Smokeless tobacco: Never Used  . Tobacco comment: 09/14/2011; 02/28/2018  "quit smoking when I was ~ 17; probably didn't smoke 10 packs in my life"  Substance Use Topics  . Alcohol use: Never    Frequency: Never    ALLERGIES: No Known Allergies   PERTINENT MEDICATIONS:  Outpatient Encounter Medications as of 01/25/2019  Medication Sig  . atorvastatin (LIPITOR) 20 MG tablet TAKE 1 TABLET BY MOUTH  DAILY AT 6 PM (Patient not taking: Reported on 12/13/2018)  . cholecalciferol (VITAMIN D) 1000 units tablet Take 1,000 Units by mouth daily.  . cyanocobalamin 1000 MCG tablet Take 2,000 mcg by mouth daily.   Marland Kitchen docusate sodium (COLACE) 100 MG capsule Take 100 mg by mouth 2 (two) times daily.   Marland Kitchen ELIQUIS 2.5 MG TABS tablet TAKE 1 TABLET BY MOUTH  TWICE DAILY  . feeding supplement, ENSURE ENLIVE, (ENSURE ENLIVE) LIQD Take 237 mLs by mouth 3 (three) times daily between meals.  . furosemide (LASIX) 20 MG tablet Take 1 tablet (20 mg total) by mouth daily.  . Multiple Vitamins-Minerals (ICAPS AREDS 2 PO) Take 1 tablet by mouth 2 (two) times daily.   . nitroGLYCERIN (NITROSTAT) 0.4 MG SL tablet Place 1 tablet (0.4 mg total) under the tongue every 5 (five) minutes x 3 doses as needed for chest pain. Take only as needed for chest pain if your blood pressure is greater than 100/80.   No facility-administered encounter medications on file as of 01/25/2019.        Jennamarie Goings Jenetta Downer, NP

## 2019-02-02 ENCOUNTER — Other Ambulatory Visit: Payer: Medicare Other | Admitting: Adult Health Nurse Practitioner

## 2019-02-02 DIAGNOSIS — Z515 Encounter for palliative care: Secondary | ICD-10-CM

## 2019-02-03 ENCOUNTER — Other Ambulatory Visit: Payer: Self-pay

## 2019-02-03 NOTE — Progress Notes (Signed)
[facility] Consult Note Telephone: [phone]  Fax: [phone]  PATIENT NAME: Louis Reid DOB: Oct 06, 1932 MRN: [id number]  PRIMARY CARE PROVIDER:   [name], MD  REFERRING PROVIDER:  [name], MD [place],  [place] [address]  RESPONSIBLE PARTY:   Extended Emergency Contact Information Primary Emergency Contact: Dubinsky,[name] Address: [address] [place], [place] [address] [place] Mobile Phone: [phone] Relation: Son    RECOMMENDATIONS and PLAN:  1.  Advanced care planning.  Patient wishes to be a full code with full hospital interventions.  No changes made today.   2.  SOB and chest tightness.  Patient is still experiencing chest tightness.  He feels it may stress related as it occurs when he is dwelling on current national issues and personal issues and it will go away when he distracts himself with his hobbies.  I agree with his self assessment.  Have encouraged him to engage more in those activities that take his mind off the things that stress him.  He does get comfort in prayer and reading his Bible and we spent some time talking about what stresses him and how to use the things that calm him when he does get stressed.  We also discussed disease progression as he is still having increased SOB and weakness.  States that he doesn't get winded with activity but that he has to take more frequent rest breaks.  Have encouraged him to continue taking the frequent rest breaks.  Patient is in NAD, no edema, lung sounds clear, no fever.  Denies any pain including chest pain.  As patient is in NAD with no signs of fluid overload, I am inclined to believe his current symptoms are related to disease progression and will keep his cardiologist, Dr. Haroldine Laws, updated on his status.  He did ask about what would be the safest thing to take if he did have any pain and  I suggested Tylenol. Educated him about the risks of bleeding NSAIDs as he is on Eliquis.    I spent 45 minutes providing this consultation,  from 10:00 to 10:45. More than 50% of the time in this consultation was spent coordinating communication.   HISTORY OF PRESENT ILLNESS:  Louis Reid is a 85 y.o. year old male with multiple medical problems including CAD, cardiomyopathy, unstable angina, HFrEF, edema. Palliative Care was asked to help address goals of care.   CODE STATUS: Full Code  PPS: 60% HOSPICE ELIGIBILITY/DIAGNOSIS: TBD  PHYSICAL EXAM:  BP 102/58  HR 75  O2 sat 97% on RA General: NAD, frail appearing, thin Cardiovascular: regular rate and rhythm Pulmonary: lung sounds clear; normal respiratory effort Extremities: no edema, no joint deformities Skin: no rashes Neurological: Weakness but otherwise nonfocal  PAST MEDICAL HISTORY:  Past Medical History:  Diagnosis Date  . AICD (automatic cardioverter/defibrillator) present 09/13/2012  . Anemia   . CAD (coronary artery disease)    a. s/p remote CABG, b. AL STEMI c/b VF arrest in 05/2012 => LHC 05/10/12: Severe 3v CAD, S-OM1/2 ok, SVG-Dx ok, SVG-RCA occluded, LIMA-LAD atretic, proximal LAD occluded. EF 20% with anterior apical HK=> salvage PCI with POBA and thrombectomy of the occluded LAD;  c. 01/2017: atretic LIMA_LAD and known occluded SVG-RCA. Patent LAD stent and 70% proxRCA stenosis (FFR 0.88).   . Chronic systolic heart failure ([place])    a. Echo 12/13: EF 20-25%, anteroseptal, inferior, apical HK, mildly  reduced RV function, trivial pericardial effusion;   b.  Echo 3/14:  Ant, septal, apical AK, EF 25%  . Clotting disorder ([place])   . Depression    "years ago" (02/28/2018)  . History of kidney stones   . HLD (hyperlipidemia)   . Hypertension    "I've never had high blood pressure; on RX for other reasons" (02/28/2018)  . Inguinal hernia    "have one now on my left side" (09/13/2012); (02/28/2018)  . Ischemic  cardiomyopathy   . Myocardial infarction [facility]) 1996;  05/2012  . Osteoporosis   . Paroxysmal atrial fibrillation ([place]) 03/17/2016   noted on device interrogation,  will follow burden  . Pectus excavatum   . Pulmonary embolism ([place])    a. after adhesiolysis for SBO in 04/2012 => coumadin for 6 mos  . Renal cyst   . SBO (small bowel obstruction) [facility]) November 2013   s/p lysis of adhesions  . Sinoatrial node dysfunction (HCC)   . Skin cancer    "left ear; face" (02/28/2018)  . Ventricular fibrillation ([place]) 05/2012   . in setting of AL STEMI 12/13 => EF 20-25% => d/c on LifeVest (repeat echo planned in 08/2012)    SOCIAL HX:  Social History   Tobacco Use  . Smoking status: Former Smoker    Types: Cigarettes  . Smokeless tobacco: Never Used  . Tobacco comment: 09/14/2011; 02/28/2018  "quit smoking when I was ~ 17; probably didn't smoke 10 packs in my life"  Substance Use Topics  . Alcohol use: Never    Frequency: Never    ALLERGIES: No Known Allergies   PERTINENT MEDICATIONS:  Outpatient Encounter Medications as of 02/02/2019  Medication Sig  . atorvastatin (LIPITOR) 20 MG tablet TAKE 1 TABLET BY MOUTH  DAILY AT 6 PM (Patient not taking: Reported on 12/13/2018)  . cholecalciferol (VITAMIN D) 1000 units tablet Take 1,000 Units by mouth daily.  . cyanocobalamin 1000 MCG tablet Take 2,000 mcg by mouth daily.   Marland Kitchen docusate sodium (COLACE) 100 MG capsule Take 100 mg by mouth 2 (two) times daily.   Marland Kitchen ELIQUIS 2.5 MG TABS tablet TAKE 1 TABLET BY MOUTH  TWICE DAILY  . feeding supplement, ENSURE ENLIVE, (ENSURE ENLIVE) LIQD Take 237 mLs by mouth 3 (three) times daily between meals.  . furosemide (LASIX) 20 MG tablet Take 1 tablet (20 mg total) by mouth daily.  . Multiple Vitamins-Minerals (ICAPS AREDS 2 PO) Take 1 tablet by mouth 2 (two) times daily.   . nitroGLYCERIN (NITROSTAT) 0.4 MG SL tablet Place 1 tablet (0.4 mg total) under the tongue every 5 (five) minutes x 3 doses as needed for chest  pain. Take only as needed for chest pain if your blood pressure is greater than 100/80.   No facility-administered encounter medications on file as of 02/02/2019.       Steele Ledonne Jenetta Downer, NP

## 2019-02-09 ENCOUNTER — Other Ambulatory Visit: Payer: Self-pay

## 2019-02-09 ENCOUNTER — Other Ambulatory Visit: Payer: Medicare Other | Admitting: Adult Health Nurse Practitioner

## 2019-02-09 DIAGNOSIS — Z515 Encounter for palliative care: Secondary | ICD-10-CM

## 2019-02-09 NOTE — Progress Notes (Signed)
[facility] Consult Note Telephone: [phone]  Fax: [phone]  PATIENT NAME: Louis Reid DOB: 07/04/1932 MRN: [id number]  PRIMARY CARE PROVIDER:   [name], MD  REFERRING PROVIDER:  [name], MD [address] [place],  [place] [address]  RESPONSIBLE PARTY:   Extended Emergency Contact Information Primary Emergency Contact: Eckles,[name] Address: [address], [place] [address] [place] Mobile Phone: [phone] Relation: Son  Due to the COVID-19 crisis, this visit was done via telemedicine and it was initiated and consent by this patient and or family. Video-audio (telehealth) contact was unable to be done due to technical barriers from the patient's side.       RECOMMENDATIONS and PLAN:  1.   Advanced care planning. Patient wishes to be a full code with full hospital interventions. No changes made today.  2.  Neck pain.  Patient complaining of neck pain.  Feels like it is muscle spasms related to tension and stress.  He has expressed feeling stressed a lot about things lately.  States that Tylenol does not help much.  He asked about ibuprofen as an anti flammatory.  Did suggest that it could help but only wanted him on it for 3 days as he is on Eliquis.  Suggested 400mg  Q8 hrs for 3 days and that he could use the Biofreeze that he say he has as well.  Stated that if the pain persisted beyond the 3 days to contact his PCP.  Also gave him some neck stretches he could try to loosen his neck muscles.    I spent 20 minutes providing this consultation,  from 10:00 to 10:20. More than 50% of the time in this consultation was spent coordinating communication.   HISTORY OF PRESENT ILLNESS:  Louis Reid is a 85 y.o. year old male with multiple medical problems including CAD, cardiomyopathy, unstable angina, HFrEF, edema. Palliative Care was asked to help address  goals of care.   CODE STATUS: Full Code  PPS: 60% HOSPICE ELIGIBILITY/DIAGNOSIS: TBD  PAST MEDICAL HISTORY:  Past Medical History:  Diagnosis Date  . AICD (automatic cardioverter/defibrillator) present 09/13/2012  . Anemia   . CAD (coronary artery disease)    a. s/p remote CABG, b. AL STEMI c/b VF arrest in 05/2012 => LHC 05/10/12: Severe 3v CAD, S-OM1/2 ok, SVG-Dx ok, SVG-RCA occluded, LIMA-LAD atretic, proximal LAD occluded. EF 20% with anterior apical HK=> salvage PCI with POBA and thrombectomy of the occluded LAD;  c. 01/2017: atretic LIMA_LAD and known occluded SVG-RCA. Patent LAD stent and 70% proxRCA stenosis (FFR 0.88).   . Chronic systolic heart failure ([place])    a. Echo 12/13: EF 20-25%, anteroseptal, inferior, apical HK, mildly reduced RV function, trivial pericardial effusion;   b.  Echo 3/14:  Ant, septal, apical AK, EF 25%  . Clotting disorder ([place])   . Depression    "years ago" (02/28/2018)  . History of kidney stones   . HLD (hyperlipidemia)   . Hypertension    "I've never had high blood pressure; on RX for other reasons" (02/28/2018)  . Inguinal hernia    "have one now on my left side" (09/13/2012); (02/28/2018)  . Ischemic cardiomyopathy   . Myocardial infarction [facility]) 1996;  05/2012  . Osteoporosis   . Paroxysmal atrial fibrillation ([place]) 03/17/2016   noted on device interrogation,  will follow burden  . Pectus excavatum   . Pulmonary embolism ([place])  a. after adhesiolysis for SBO in 04/2012 => coumadin for 6 mos  . Renal cyst   . SBO (small bowel obstruction) [facility]) November 2013   s/p lysis of adhesions  . Sinoatrial node dysfunction (HCC)   . Skin cancer    "left ear; face" (02/28/2018)  . Ventricular fibrillation ([place]) 05/2012   . in setting of AL STEMI 12/13 => EF 20-25% => d/c on LifeVest (repeat echo planned in 08/2012)    SOCIAL HX:  Social History   Tobacco Use  . Smoking status: Former Smoker    Types: Cigarettes  . Smokeless tobacco: Never Used  .  Tobacco comment: 09/14/2011; 02/28/2018  "quit smoking when I was ~ 17; probably didn't smoke 10 packs in my life"  Substance Use Topics  . Alcohol use: Never    Frequency: Never    ALLERGIES: No Known Allergies   PERTINENT MEDICATIONS:  Outpatient Encounter Medications as of 02/09/2019  Medication Sig  . atorvastatin (LIPITOR) 20 MG tablet TAKE 1 TABLET BY MOUTH  DAILY AT 6 PM (Patient not taking: Reported on 12/13/2018)  . cholecalciferol (VITAMIN D) 1000 units tablet Take 1,000 Units by mouth daily.  . cyanocobalamin 1000 MCG tablet Take 2,000 mcg by mouth daily.   Marland Kitchen docusate sodium (COLACE) 100 MG capsule Take 100 mg by mouth 2 (two) times daily.   Marland Kitchen ELIQUIS 2.5 MG TABS tablet TAKE 1 TABLET BY MOUTH  TWICE DAILY  . feeding supplement, ENSURE ENLIVE, (ENSURE ENLIVE) LIQD Take 237 mLs by mouth 3 (three) times daily between meals.  . furosemide (LASIX) 20 MG tablet Take 1 tablet (20 mg total) by mouth daily.  . Multiple Vitamins-Minerals (ICAPS AREDS 2 PO) Take 1 tablet by mouth 2 (two) times daily.   . nitroGLYCERIN (NITROSTAT) 0.4 MG SL tablet Place 1 tablet (0.4 mg total) under the tongue every 5 (five) minutes x 3 doses as needed for chest pain. Take only as needed for chest pain if your blood pressure is greater than 100/80.   No facility-administered encounter medications on file as of 02/09/2019.     Ann-Marie Kluge Jenetta Downer, NP

## 2019-03-13 ENCOUNTER — Other Ambulatory Visit (HOSPITAL_COMMUNITY): Payer: Self-pay | Admitting: Internal Medicine

## 2019-03-14 ENCOUNTER — Other Ambulatory Visit: Payer: Self-pay

## 2019-03-14 ENCOUNTER — Ambulatory Visit (INDEPENDENT_AMBULATORY_CARE_PROVIDER_SITE_OTHER): Payer: Medicare Other | Admitting: *Deleted

## 2019-03-14 DIAGNOSIS — I255 Ischemic cardiomyopathy: Secondary | ICD-10-CM | POA: Diagnosis not present

## 2019-03-14 LAB — CUP PACEART INCLINIC DEVICE CHECK
Battery Remaining Longevity: 40 mo
Brady Statistic RA Percent Paced: 15 %
Brady Statistic RV Percent Paced: 0.63 %
Date Time Interrogation Session: 20201006130036
HighPow Impedance: 64.125
Implantable Lead Implant Date: 20140408
Implantable Lead Implant Date: 20140408
Implantable Lead Location: 753859
Implantable Lead Location: 753860
Implantable Pulse Generator Implant Date: 20140408
Lead Channel Impedance Value: 350 Ohm
Lead Channel Impedance Value: 387.5 Ohm
Lead Channel Pacing Threshold Amplitude: 0.5 V
Lead Channel Pacing Threshold Amplitude: 0.75 V
Lead Channel Pacing Threshold Pulse Width: 0.5 ms
Lead Channel Pacing Threshold Pulse Width: 0.5 ms
Lead Channel Sensing Intrinsic Amplitude: 11.3 mV
Lead Channel Sensing Intrinsic Amplitude: 4.5 mV
Lead Channel Setting Pacing Amplitude: 2 V
Lead Channel Setting Pacing Amplitude: 2.5 V
Lead Channel Setting Pacing Pulse Width: 0.5 ms
Lead Channel Setting Sensing Sensitivity: 0.5 mV
Pulse Gen Serial Number: 7094248

## 2019-03-14 NOTE — Progress Notes (Signed)
ICD check in clinic. Normal device function. Thresholds and sensing consistent with previous device measurements. Impedance trends stable over time. No evidence of any ventricular arrhythmias. AT/AF burden < 1% , AMS episodes with EGMs appear to be due to noise on Atrial lead that was not reproducible in clinic. Histogram distribution appropriate for patient and level of activity. No changes made this session. Device programmed at appropriate safety margins. Device programmed to optimize intrinsic conduction. Estimated longevity 3 yrs, 4 months. Plan to check device every 3 months in office, next in clinic check is 06/15/19. Patient education completed including shock plan.

## 2019-03-20 ENCOUNTER — Other Ambulatory Visit: Payer: Medicare Other | Admitting: Hospice

## 2019-03-20 ENCOUNTER — Other Ambulatory Visit: Payer: Self-pay

## 2019-03-20 DIAGNOSIS — Z515 Encounter for palliative care: Secondary | ICD-10-CM

## 2019-03-20 NOTE — Progress Notes (Signed)
[facility] Consult Note Telephone: [phone]  Fax: [phone]  PATIENT NAME: Louis Reid DOB: [birth year] MRN: [id number]  PRIMARY CARE PROVIDER:   [name], MD  REFERRING PROVIDER:  [name], MD [place],  [place] [address]  RESPONSIBLE PARTY:   Extended Emergency Contact Information Primary Emergency Contact: Geeslin,[name] Address: [address], [place] [address] [place] Mobile Phone: [phone] Relation: Son   TELEHEALTH VISIT STATEMENT Due to the COVID-19 crisis, this visit was done via telephone from my office. It was initiated and consented to by this patient and/or family.   RECOMMENDATIONS/PLAN:  1. Advance Care Planning/Goals of Care: Goals include to maximize quality of life and symptom management. Patient is a full code with full scope of treatment.  2. Symptom management: Patient said he is on Lasix 20mg  daily but he feels it makes him pee a lot. He denied fatigue, edema, SOB. He wanted to know if he should discontinue medication. Education provided on his history of chronic systolic heart failure which could exacerbate if he stops taking Lasix as ordered.  He said he has easy access to his Cardiologist; recommended to call to change to half the dose daily or 20mg  every other day and monitor effect. He said he knows he is not drinking enough fluid and that he will do better. He said his wife was on dialysis and that he is sensitive to anything about his renal function.   3. Follow up Palliative Care Visit: Palliative care will continue to follow for goals of care clarification and symptom management.   I spent 30 minutes providing this consultation, from 4.00pm to 4.30pm. More than 50% of the time in this consultation was spent on coordinating communication.  HISTORY OF PRESENT ILLNESS:  Louis Reid is a 85 y.o. year old male  with multiple medical problems including CAD, cardiomyopathy, unstable angina, HFrEF, edema, chronic systolic heart failure.  Palliative Care was asked to help address goals of care.  CODE STATUS: Full  PPS: 60% HOSPICE ELIGIBILITY/DIAGNOSIS: TBD  PAST MEDICAL HISTORY:  Past Medical History:  Diagnosis Date  . AICD (automatic cardioverter/defibrillator) present 09/13/2012  . Anemia   . CAD (coronary artery disease)    a. s/p remote CABG, b. AL STEMI c/b VF arrest in 05/2012 => LHC 05/10/12: Severe 3v CAD, S-OM1/2 ok, SVG-Dx ok, SVG-RCA occluded, LIMA-LAD atretic, proximal LAD occluded. EF 20% with anterior apical HK=> salvage PCI with POBA and thrombectomy of the occluded LAD;  c. 01/2017: atretic LIMA_LAD and known occluded SVG-RCA. Patent LAD stent and 70% proxRCA stenosis (FFR 0.88).   . Chronic systolic heart failure ([place])    a. Echo 12/13: EF 20-25%, anteroseptal, inferior, apical HK, mildly reduced RV function, trivial pericardial effusion;   b.  Echo 3/14:  Ant, septal, apical AK, EF 25%  . Clotting disorder ([place])   . Depression    "years ago" (02/28/2018)  . History of kidney stones   . HLD (hyperlipidemia)   . Hypertension    "I've never had high blood pressure; on RX for other reasons" (02/28/2018)  . Inguinal hernia    "have one now on my left side" (09/13/2012); (02/28/2018)  . Ischemic cardiomyopathy   . Myocardial infarction [facility]) 1996;  05/2012  . Osteoporosis   . Paroxysmal atrial fibrillation ([place]) 03/17/2016   noted on device interrogation,  will follow burden  . Pectus excavatum   .  Pulmonary embolism ([place])    a. after adhesiolysis for SBO in 04/2012 => coumadin for 6 mos  . Renal cyst   . SBO (small bowel obstruction) [facility]) November 2013   s/p lysis of adhesions  . Sinoatrial node dysfunction (HCC)   . Skin cancer    "left ear; face" (02/28/2018)  . Ventricular fibrillation ([place]) 05/2012   . in setting of AL STEMI 12/13 => EF 20-25% => d/c on LifeVest (repeat echo  planned in 08/2012)    SOCIAL HX:  Social History   Tobacco Use  . Smoking status: Former Smoker    Types: Cigarettes  . Smokeless tobacco: Never Used  . Tobacco comment: 09/14/2011; 02/28/2018  "quit smoking when I was ~ 17; probably didn't smoke 10 packs in my life"  Substance Use Topics  . Alcohol use: Never    Frequency: Never    ALLERGIES: No Known Allergies   PERTINENT MEDICATIONS:  Outpatient Encounter Medications as of 03/20/2019  Medication Sig  . atorvastatin (LIPITOR) 20 MG tablet TAKE 1 TABLET BY MOUTH  DAILY AT 6 PM  . cholecalciferol (VITAMIN D) 1000 units tablet Take 1,000 Units by mouth daily.  . cyanocobalamin 1000 MCG tablet Take 2,000 mcg by mouth daily.   Marland Kitchen docusate sodium (COLACE) 100 MG capsule Take 100 mg by mouth 2 (two) times daily.   Marland Kitchen ELIQUIS 2.5 MG TABS tablet TAKE 1 TABLET BY MOUTH  TWICE DAILY  . feeding supplement, ENSURE ENLIVE, (ENSURE ENLIVE) LIQD Take 237 mLs by mouth 3 (three) times daily between meals.  . furosemide (LASIX) 20 MG tablet Take 1 tablet (20 mg total) by mouth daily.  . Multiple Vitamins-Minerals (ICAPS AREDS 2 PO) Take 1 tablet by mouth 2 (two) times daily.   . nitroGLYCERIN (NITROSTAT) 0.4 MG SL tablet Place 1 tablet (0.4 mg total) under the tongue every 5 (five) minutes x 3 doses as needed for chest pain. Take only as needed for chest pain if your blood pressure is greater than 100/80.   No facility-administered encounter medications on file as of 03/20/2019.     Teodoro Spray, NP

## 2019-03-30 ENCOUNTER — Other Ambulatory Visit: Payer: Self-pay | Admitting: Cardiology

## 2019-04-07 ENCOUNTER — Other Ambulatory Visit: Payer: Medicare Other | Admitting: Hospice

## 2019-04-07 ENCOUNTER — Other Ambulatory Visit: Payer: Self-pay

## 2019-04-07 DIAGNOSIS — Z515 Encounter for palliative care: Secondary | ICD-10-CM

## 2019-04-07 NOTE — Progress Notes (Signed)
[facility] Consult Note Telephone: [phone]  Fax: [phone]  PATIENT NAME: Louis Reid DOB: [birth year] MRN: [id number]  PRIMARY CARE PROVIDER:   [name], MD  REFERRING PROVIDER:  [name], MD [place],  [place] [address]  RESPONSIBLE PARTY:Self Extended Emergency Contact Information Primary Emergency Contact: Wyss,[name] Address: [address] [place], [place] [address] [place] Mobile Phone: [phone] Relation: Son   RECOMMENDATIONS/PLAN:  1. Advance Care Planning/Goals of Care:  Visit consisted of building trust and discussions on his goals of care. Goals include to maximize quality of life and symptom management. Patient is a full code with full scope of treatment. He said he has realistic expectations of his health given his comorbid conditions. Will continue ongoing discussions of goals of care clarification.  2. Symptom management: Edema/fatigue related to CHF CAD. Patient said he now takes 10mg  Lasix every day and this is working perfectly for him. He denied edema, SOB or worsening fatigue. Advised him to monitor closely for change such as increasing fatigue, SOB, edema and alert his Cardiologist as needed. Education on cutting back on his salt intake, staying active as much as possible with rest periods in between as indicated. He verbalized understanding, expressed his commitment to stay active as much as possible and cut back on his salt intake.  3. Follow up Palliative Care Visit: Palliative care will continue to follow for goals of care clarification and symptom management.   I spent 30 minutes providing this consultation, from 9.30am to 10.00am.  More than 50% of the time in this consultation was spent on coordinating communication.  HISTORY OF PRESENT ILLNESS:Louis R Jonesis a 85 y.o.year oldmalewith multiple medical  problems including CAD, cardiomyopathy, unstable angina, HFrEF, edema, chronic systolic heart failure.  Palliative Care was asked to help address goals of care.  CODE STATUS: Full  PPS: 60% HOSPICE ELIGIBILITY/DIAGNOSIS: TBD  PAST MEDICAL HISTORY:  Past Medical History:  Diagnosis Date  . AICD (automatic cardioverter/defibrillator) present 09/13/2012  . Anemia   . CAD (coronary artery disease)    a. s/p remote CABG, b. AL STEMI c/b VF arrest in 05/2012 => LHC 05/10/12: Severe 3v CAD, S-OM1/2 ok, SVG-Dx ok, SVG-RCA occluded, LIMA-LAD atretic, proximal LAD occluded. EF 20% with anterior apical HK=> salvage PCI with POBA and thrombectomy of the occluded LAD;  c. 01/2017: atretic LIMA_LAD and known occluded SVG-RCA. Patent LAD stent and 70% proxRCA stenosis (FFR 0.88).   . Chronic systolic heart failure ([place])    a. Echo 12/13: EF 20-25%, anteroseptal, inferior, apical HK, mildly reduced RV function, trivial pericardial effusion;   b.  Echo 3/14:  Ant, septal, apical AK, EF 25%  . Clotting disorder ([place])   . Depression    "years ago" (02/28/2018)  . History of kidney stones   . HLD (hyperlipidemia)   . Hypertension    "I've never had high blood pressure; on RX for other reasons" (02/28/2018)  . Inguinal hernia    "have one now on my left side" (09/13/2012); (02/28/2018)  . Ischemic cardiomyopathy   . Myocardial infarction [facility]) 1996;  05/2012  . Osteoporosis   . Paroxysmal atrial fibrillation ([place]) 03/17/2016   noted on device interrogation,  will follow burden  . Pectus excavatum   . Pulmonary embolism ([place])    a. after adhesiolysis for SBO in 04/2012 => coumadin for 6 mos  . Renal cyst   . SBO (small bowel  obstruction) [facility]) November 2013   s/p lysis of adhesions  . Sinoatrial node dysfunction (HCC)   . Skin cancer    "left ear; face" (02/28/2018)  . Ventricular fibrillation ([place]) 05/2012   . in setting of AL STEMI 12/13 => EF 20-25% => d/c on LifeVest (repeat echo planned in 08/2012)     SOCIAL HX:  Social History   Tobacco Use  . Smoking status: Former Smoker    Types: Cigarettes  . Smokeless tobacco: Never Used  . Tobacco comment: 09/14/2011; 02/28/2018  "quit smoking when I was ~ 17; probably didn't smoke 10 packs in my life"  Substance Use Topics  . Alcohol use: Never    Frequency: Never    ALLERGIES: No Known Allergies   PERTINENT MEDICATIONS:  Outpatient Encounter Medications as of 04/07/2019  Medication Sig  . atorvastatin (LIPITOR) 20 MG tablet TAKE 1 TABLET BY MOUTH  DAILY AT 6 PM  . cholecalciferol (VITAMIN D) 1000 units tablet Take 1,000 Units by mouth daily.  . cyanocobalamin 1000 MCG tablet Take 2,000 mcg by mouth daily.   Marland Kitchen docusate sodium (COLACE) 100 MG capsule Take 100 mg by mouth 2 (two) times daily.   Marland Kitchen ELIQUIS 2.5 MG TABS tablet TAKE 1 TABLET BY MOUTH TWICE A DAY  . feeding supplement, ENSURE ENLIVE, (ENSURE ENLIVE) LIQD Take 237 mLs by mouth 3 (three) times daily between meals.  . furosemide (LASIX) 20 MG tablet Take 1 tablet (20 mg total) by mouth daily.  . Multiple Vitamins-Minerals (ICAPS AREDS 2 PO) Take 1 tablet by mouth 2 (two) times daily.   . nitroGLYCERIN (NITROSTAT) 0.4 MG SL tablet Place 1 tablet (0.4 mg total) under the tongue every 5 (five) minutes x 3 doses as needed for chest pain. Take only as needed for chest pain if your blood pressure is greater than 100/80.   No facility-administered encounter medications on file as of 04/07/2019.      Teodoro Spray, NP

## 2019-05-10 ENCOUNTER — Other Ambulatory Visit: Payer: Self-pay

## 2019-05-10 ENCOUNTER — Other Ambulatory Visit: Payer: Medicare Other | Admitting: Hospice

## 2019-05-10 DIAGNOSIS — I5022 Chronic systolic (congestive) heart failure: Secondary | ICD-10-CM

## 2019-05-10 DIAGNOSIS — Z515 Encounter for palliative care: Secondary | ICD-10-CM

## 2019-05-10 NOTE — Progress Notes (Signed)
[facility] Consult Note Telephone: [phone]  Fax: [phone]  PATIENT NAME: Louis Reid DOB: 1932/11/17 MRN: [id number]  PRIMARY CARE PROVIDER:   [name], MD  REFERRING PROVIDER:  [name], MD [place],  [place] [address]  RESPONSIBLE PARTY:     TELEHEALTH VISIT STATEMENT Due to the COVID-19 crisis, this visit was done via telephone from my office. It was initiated and consented to by this patient and/or family.  RECOMMENDATIONS/PLAN:   Advance Care Planning/Goals of Care: Telehealth Visit consisted of building trust and discussions on Palliative Medicine as specialized medical care for people living with serious illness, aimed at facilitating better quality of life through symptoms relief, assisting with advance care plan and establishing goals of care. Patient remains a full code with Goals of care including to maximize quality of life and symptom management. Symptom management: Patient continues on Lasix 10mg  daily and said it works very well for him, less nocturia and no edema at this time. He denied cough, shortness of breath. No chest pain or need to use Nitroglycerine or pain medication. Patient is fairly stable at this time. Reiterated lifestyle modifications such as exercise as tolerated, reduced salt intake.  He wanted to know from where he would get the COVID-19 vaccine when it is available. He was advised to reach his PCP when that time comes. He verbalized understanding and expressed thanks.  Follow up: Palliative care will continue to follow patient for goals of care clarification and symptom management. I spent 30 minutes providing this consultation  from  12.00pm to 12.30pm. More than 50% of the time in this consultation was spent on coordinating communication  HISTORY OF PRESENT ILLNESS:Louis R Jonesis a 85 y.o.year oldmalewith multiple medical problems including CAD,  cardiomyopathy, unstable angina, HFrEF, edema, chronic systolic heart failure.Palliative Care was asked to help address goals of care  CODE STATUS: Full  PPS: 60% HOSPICE ELIGIBILITY/DIAGNOSIS: TBD  PAST MEDICAL HISTORY:  Past Medical History:  Diagnosis Date  . AICD (automatic cardioverter/defibrillator) present 09/13/2012  . Anemia   . CAD (coronary artery disease)    a. s/p remote CABG, b. AL STEMI c/b VF arrest in 05/2012 => LHC 05/10/12: Severe 3v CAD, S-OM1/2 ok, SVG-Dx ok, SVG-RCA occluded, LIMA-LAD atretic, proximal LAD occluded. EF 20% with anterior apical HK=> salvage PCI with POBA and thrombectomy of the occluded LAD;  c. 01/2017: atretic LIMA_LAD and known occluded SVG-RCA. Patent LAD stent and 70% proxRCA stenosis (FFR 0.88).   . Chronic systolic heart failure ([place])    a. Echo 12/13: EF 20-25%, anteroseptal, inferior, apical HK, mildly reduced RV function, trivial pericardial effusion;   b.  Echo 3/14:  Ant, septal, apical AK, EF 25%  . Clotting disorder ([place])   . Depression    "years ago" (02/28/2018)  . History of kidney stones   . HLD (hyperlipidemia)   . Hypertension    "I've never had high blood pressure; on RX for other reasons" (02/28/2018)  . Inguinal hernia    "have one now on my left side" (09/13/2012); (02/28/2018)  . Ischemic cardiomyopathy   . Myocardial infarction [facility]) 1996;  05/2012  . Osteoporosis   . Paroxysmal atrial fibrillation ([place]) 03/17/2016   noted on device interrogation,  will follow burden  . Pectus excavatum   . Pulmonary embolism ([place])    a. after adhesiolysis for SBO in 04/2012 => coumadin for 6 mos  . Renal cyst   . SBO (  small bowel obstruction) [facility]) November 2013   s/p lysis of adhesions  . Sinoatrial node dysfunction (HCC)   . Skin cancer    "left ear; face" (02/28/2018)  . Ventricular fibrillation ([place]) 05/2012   . in setting of AL STEMI 12/13 => EF 20-25% => d/c on LifeVest (repeat echo planned in 08/2012)    SOCIAL HX:  Social  History   Tobacco Use  . Smoking status: Former Smoker    Types: Cigarettes  . Smokeless tobacco: Never Used  . Tobacco comment: 09/14/2011; 02/28/2018  "quit smoking when I was ~ 17; probably didn't smoke 10 packs in my life"  Substance Use Topics  . Alcohol use: Never    Frequency: Never    ALLERGIES: No Known Allergies   PERTINENT MEDICATIONS:  Outpatient Encounter Medications as of 05/10/2019  Medication Sig  . atorvastatin (LIPITOR) 20 MG tablet TAKE 1 TABLET BY MOUTH  DAILY AT 6 PM  . cholecalciferol (VITAMIN D) 1000 units tablet Take 1,000 Units by mouth daily.  . cyanocobalamin 1000 MCG tablet Take 2,000 mcg by mouth daily.   Marland Kitchen docusate sodium (COLACE) 100 MG capsule Take 100 mg by mouth 2 (two) times daily.   Marland Kitchen ELIQUIS 2.5 MG TABS tablet TAKE 1 TABLET BY MOUTH TWICE A DAY  . feeding supplement, ENSURE ENLIVE, (ENSURE ENLIVE) LIQD Take 237 mLs by mouth 3 (three) times daily between meals.  . furosemide (LASIX) 20 MG tablet Take 1 tablet (20 mg total) by mouth daily.  . Multiple Vitamins-Minerals (ICAPS AREDS 2 PO) Take 1 tablet by mouth 2 (two) times daily.   . nitroGLYCERIN (NITROSTAT) 0.4 MG SL tablet Place 1 tablet (0.4 mg total) under the tongue every 5 (five) minutes x 3 doses as needed for chest pain. Take only as needed for chest pain if your blood pressure is greater than 100/80.   No facility-administered encounter medications on file as of 05/10/2019.      Teodoro Spray, NP

## 2019-06-15 ENCOUNTER — Ambulatory Visit (INDEPENDENT_AMBULATORY_CARE_PROVIDER_SITE_OTHER): Payer: Medicare Other | Admitting: *Deleted

## 2019-06-15 ENCOUNTER — Other Ambulatory Visit: Payer: Self-pay

## 2019-06-15 DIAGNOSIS — Z95810 Presence of automatic (implantable) cardiac defibrillator: Secondary | ICD-10-CM | POA: Diagnosis not present

## 2019-06-15 DIAGNOSIS — I4901 Ventricular fibrillation: Secondary | ICD-10-CM

## 2019-06-15 LAB — CUP PACEART INCLINIC DEVICE CHECK
Battery Remaining Longevity: 37 mo
Brady Statistic RA Percent Paced: 13 %
Brady Statistic RV Percent Paced: 0.42 %
Date Time Interrogation Session: 20210107095344
HighPow Impedance: 64 Ohm
Implantable Lead Implant Date: 20140408
Implantable Lead Implant Date: 20140408
Implantable Lead Location: 753859
Implantable Lead Location: 753860
Implantable Pulse Generator Implant Date: 20140408
Lead Channel Impedance Value: 350 Ohm
Lead Channel Impedance Value: 387.5 Ohm
Lead Channel Pacing Threshold Amplitude: 0.5 V
Lead Channel Pacing Threshold Amplitude: 0.75 V
Lead Channel Pacing Threshold Pulse Width: 0.5 ms
Lead Channel Pacing Threshold Pulse Width: 0.5 ms
Lead Channel Sensing Intrinsic Amplitude: 11.7 mV
Lead Channel Sensing Intrinsic Amplitude: 3.6 mV
Lead Channel Setting Pacing Amplitude: 2 V
Lead Channel Setting Pacing Amplitude: 2.5 V
Lead Channel Setting Pacing Pulse Width: 0.5 ms
Lead Channel Setting Sensing Sensitivity: 0.5 mV
Pulse Gen Serial Number: 7094248

## 2019-06-15 NOTE — Progress Notes (Signed)
ICD check in clinic. Normal device function. Thresholds and sensing consistent with previous device measurements. Impedance trends stable over time. 367 mode switches (<1%), on Eliquis, all available EGMs show RA lead noise (similar to previous), unable to reproduce with isometrics today. No ventricular arrhythmias. Histogram distribution appropriate for patient and level of activity. No changes made this session. Device programmed at appropriate safety margins. Device programmed to optimize intrinsic conduction. Estimated longevity 2.6-3.1 years. Patient education completed including shock plan. Vibratory alert demonstrated. ROV with A. Quebrada, [place], on 09/19/19.

## 2019-06-19 ENCOUNTER — Other Ambulatory Visit: Payer: Self-pay

## 2019-06-19 ENCOUNTER — Other Ambulatory Visit: Payer: Medicare Other | Admitting: Hospice

## 2019-06-19 DIAGNOSIS — I5022 Chronic systolic (congestive) heart failure: Secondary | ICD-10-CM

## 2019-06-19 DIAGNOSIS — Z515 Encounter for palliative care: Secondary | ICD-10-CM

## 2019-06-19 NOTE — Progress Notes (Signed)
[facility] Consult Note Telephone: [phone]  Fax: [phone]  PATIENT NAME: Louis Reid DOB: 1933/05/15 MRN: [id number]  PRIMARY CARE PROVIDER:   [name], MD  REFERRING PROVIDER:  [name], MD [place],  [place] [address]   RESPONSIBLE PARTY:   Self  TELEHEALTH VISIT STATEMENT Due to the COVID-19 crisis, this visit was done via telephone from my office. It was initiated and consented to by this patient and/or family.  RECOMMENDATIONS/PLAN:   Advance Care Planning/Goals of Care: Patient remains a full code with Goals of care including to maximize quality of life and symptom management.   Symptom management: ICD device checked at Cardiologist office 06/15/2019 - normal device function. Patient denies palpitations, chest pain/discomfot; continues on Eliquis as ordered. He had urinary symptoms with smear of blood in urine three weeks ago, of which PCP treated him with antibiotics for UTI. Patient denies blood in urinary or urinary symptoms. He said he had gone to his Urologist who did a PET scan; no result yet. Patient to decide if he wishes to continue with cystoscopy as suggested by Urologist, considering that antibiotics treatment resolved his symptoms/infection. Patient continues on Lasix 10mg  daily and said it works very well for him, less nocturia and no edema at this time. He denied cough, shortness of breath. No chest pain or need to use Nitroglycerine or pain medication. He continues on lifestyle modifications such as exercise as tolerated, reduced salt intake and COVID-19 precautions. He has appointment to get his COVID-19 vaccine next week. Follow up: Palliative care will continue to follow patient for goals of care clarification and symptom management. I spent 30 minutes providing this consultation  from 11am to 12pm. More than 50% of the time in this consultation was spent on  coordinating communication  HISTORY OF PRESENT ILLNESS:Louis R Jonesis a 86 y.o.year oldmalewith multiple medical problems including CAD, cardiomyopathy, unstable angina, HFrEF, edema, chronic systolic heart failure.Palliative Care was asked to help address goals of care  PPS: 60% HOSPICE ELIGIBILITY/DIAGNOSIS: TBD  PAST MEDICAL HISTORY:  Past Medical History:  Diagnosis Date  . AICD (automatic cardioverter/defibrillator) present 09/13/2012  . Anemia   . CAD (coronary artery disease)    a. s/p remote CABG, b. AL STEMI c/b VF arrest in 05/2012 => LHC 05/10/12: Severe 3v CAD, S-OM1/2 ok, SVG-Dx ok, SVG-RCA occluded, LIMA-LAD atretic, proximal LAD occluded. EF 20% with anterior apical HK=> salvage PCI with POBA and thrombectomy of the occluded LAD;  c. 01/2017: atretic LIMA_LAD and known occluded SVG-RCA. Patent LAD stent and 70% proxRCA stenosis (FFR 0.88).   . Chronic systolic heart failure ([place])    a. Echo 12/13: EF 20-25%, anteroseptal, inferior, apical HK, mildly reduced RV function, trivial pericardial effusion;   b.  Echo 3/14:  Ant, septal, apical AK, EF 25%  . Clotting disorder ([place])   . Depression    "years ago" (02/28/2018)  . History of kidney stones   . HLD (hyperlipidemia)   . Hypertension    "I've never had high blood pressure; on RX for other reasons" (02/28/2018)  . Inguinal hernia    "have one now on my left side" (09/13/2012); (02/28/2018)  . Ischemic cardiomyopathy   . Myocardial infarction [facility]) 1996;  05/2012  . Osteoporosis   . Paroxysmal atrial fibrillation ([place]) 03/17/2016   noted on device interrogation,  will follow burden  . Pectus excavatum   . Pulmonary embolism ([place])    a. after  adhesiolysis for SBO in 04/2012 => coumadin for 6 mos  . Renal cyst   . SBO (small bowel obstruction) [facility]) November 2013   s/p lysis of adhesions  . Sinoatrial node dysfunction (HCC)   . Skin cancer    "left ear; face" (02/28/2018)  . Ventricular fibrillation ([place])  05/2012   . in setting of AL STEMI 12/13 => EF 20-25% => d/c on LifeVest (repeat echo planned in 08/2012)    SOCIAL HX:  Social History   Tobacco Use  . Smoking status: Former Smoker    Types: Cigarettes  . Smokeless tobacco: Never Used  . Tobacco comment: 09/14/2011; 02/28/2018  "quit smoking when I was ~ 17; probably didn't smoke 10 packs in my life"  Substance Use Topics  . Alcohol use: Never    ALLERGIES: No Known Allergies   PERTINENT MEDICATIONS:  Outpatient Encounter Medications as of 06/19/2019  Medication Sig  . atorvastatin (LIPITOR) 20 MG tablet TAKE 1 TABLET BY MOUTH  DAILY AT 6 PM (Patient not taking: Reported on 06/15/2019)  . cholecalciferol (VITAMIN D) 1000 units tablet Take 1,000 Units by mouth daily.  . cyanocobalamin 1000 MCG tablet Take 2,000 mcg by mouth daily.   Marland Kitchen docusate sodium (COLACE) 100 MG capsule Take 100 mg by mouth 2 (two) times daily.   Marland Kitchen ELIQUIS 2.5 MG TABS tablet TAKE 1 TABLET BY MOUTH TWICE A DAY  . feeding supplement, ENSURE ENLIVE, (ENSURE ENLIVE) LIQD Take 237 mLs by mouth 3 (three) times daily between meals.  . furosemide (LASIX) 20 MG tablet Take 1 tablet (20 mg total) by mouth daily. (Patient taking differently: Take 10 mg by mouth daily. )  . Multiple Vitamins-Minerals (ICAPS AREDS 2 PO) Take 1 tablet by mouth 2 (two) times daily.   . nitroGLYCERIN (NITROSTAT) 0.4 MG SL tablet Place 1 tablet (0.4 mg total) under the tongue every 5 (five) minutes x 3 doses as needed for chest pain. Take only as needed for chest pain if your blood pressure is greater than 100/80.   No facility-administered encounter medications on file as of 06/19/2019.    Teodoro Spray, NP

## 2019-07-31 ENCOUNTER — Other Ambulatory Visit: Payer: Self-pay

## 2019-07-31 ENCOUNTER — Other Ambulatory Visit: Payer: Medicare Other | Admitting: Hospice

## 2019-07-31 DIAGNOSIS — I5022 Chronic systolic (congestive) heart failure: Secondary | ICD-10-CM

## 2019-07-31 DIAGNOSIS — Z515 Encounter for palliative care: Secondary | ICD-10-CM

## 2019-07-31 NOTE — Progress Notes (Signed)
[facility] Consult Note Telephone: [phone]  Fax: [phone]  PATIENT NAME: Louis Reid DOB: 1933/02/03 MRN: [id number]  PRIMARY CARE PROVIDER:   [name], MD  REFERRING PROVIDER:  [name], MD [place],  [place] [address]   RESPONSIBLE PARTY:Self  TELEHEALTH VISIT STATEMENT Due to the COVID-19 crisis, this visit was done via telephone from my office. It was initiated and consented to by this patient and/or family.  RECOMMENDATIONS/PLAN:  Advance Care Planning/Goals of Care: Telehealth visit  to build trust and follow up on palliative care. Patient is a full code withGoals of care includingto maximize quality of life and symptom management.  Symptom management:On going fatigue r/t CHF at baseline; patient still independent in his daily activities; continuing to pace and rest between activities.  Patient had urine in blood, treated with Bactrim for UTI. He reports PET scan and cystoscopy were negative; no more blood in urine or any urinary symptoms. ICD device checked at Cardiologist office 06/15/2019 - normal device function. Patient denies palpitations, chest pain/discomfot; continues on Eliquis as ordered.  Patient continues on Lasix 10mg  daily and said it works very well for him, less nocturia and no edema at this time. He denied cough, shortness of breath. No chest pain or need to use Nitroglycerine or pain medication. He continues on lifestyle modifications such as exercise as tolerated, reduced salt intake. He reports taking 2 shots of COVID-19 vaccination. No acute concerns or complaints.  Follow [id number] care will continue to follow patient for goals of care clarification and symptom management. I spent28minutes providing this consultation.More than 50% of the time in this consultation was spent on coordinating communication  HISTORY OF PRESENT ILLNESS:Louis R  Jonesis a 86 y.o.year oldmalewith multiple medical problems including CAD, cardiomyopathy, unstable angina, HFrEF, edema, chronic systolic heart failure.Palliative Care was asked to help address goals of care    CODE STATUS: Full  PPS: 60% HOSPICE ELIGIBILITY/DIAGNOSIS: TBD  PAST MEDICAL HISTORY:  Past Medical History:  Diagnosis Date  . AICD (automatic cardioverter/defibrillator) present 09/13/2012  . Anemia   . CAD (coronary artery disease)    a. s/p remote CABG, b. AL STEMI c/b VF arrest in 05/2012 => LHC 05/10/12: Severe 3v CAD, S-OM1/2 ok, SVG-Dx ok, SVG-RCA occluded, LIMA-LAD atretic, proximal LAD occluded. EF 20% with anterior apical HK=> salvage PCI with POBA and thrombectomy of the occluded LAD;  c. 01/2017: atretic LIMA_LAD and known occluded SVG-RCA. Patent LAD stent and 70% proxRCA stenosis (FFR 0.88).   . Chronic systolic heart failure ([place])    a. Echo 12/13: EF 20-25%, anteroseptal, inferior, apical HK, mildly reduced RV function, trivial pericardial effusion;   b.  Echo 3/14:  Ant, septal, apical AK, EF 25%  . Clotting disorder ([place])   . Depression    "years ago" (02/28/2018)  . History of kidney stones   . HLD (hyperlipidemia)   . Hypertension    "I've never had high blood pressure; on RX for other reasons" (02/28/2018)  . Inguinal hernia    "have one now on my left side" (09/13/2012); (02/28/2018)  . Ischemic cardiomyopathy   . Myocardial infarction [facility]) 1996;  05/2012  . Osteoporosis   . Paroxysmal atrial fibrillation ([place]) 03/17/2016   noted on device interrogation,  will follow burden  . Pectus excavatum   . Pulmonary embolism ([place])    a. after adhesiolysis for SBO in 04/2012 => coumadin for 6 mos  . Renal cyst   .  SBO (small bowel obstruction) [facility]) November 2013   s/p lysis of adhesions  . Sinoatrial node dysfunction (HCC)   . Skin cancer    "left ear; face" (02/28/2018)  . Ventricular fibrillation ([place]) 05/2012   . in setting of AL STEMI 12/13 => EF  20-25% => d/c on LifeVest (repeat echo planned in 08/2012)    SOCIAL HX:  Social History   Tobacco Use  . Smoking status: Former Smoker    Types: Cigarettes  . Smokeless tobacco: Never Used  . Tobacco comment: 09/14/2011; 02/28/2018  "quit smoking when I was ~ 17; probably didn't smoke 10 packs in my life"  Substance Use Topics  . Alcohol use: Never    ALLERGIES: No Known Allergies   PERTINENT MEDICATIONS:  Outpatient Encounter Medications as of 07/31/2019  Medication Sig  . atorvastatin (LIPITOR) 20 MG tablet TAKE 1 TABLET BY MOUTH  DAILY AT 6 PM (Patient not taking: Reported on 06/15/2019)  . cholecalciferol (VITAMIN D) 1000 units tablet Take 1,000 Units by mouth daily.  . cyanocobalamin 1000 MCG tablet Take 2,000 mcg by mouth daily.   Marland Kitchen docusate sodium (COLACE) 100 MG capsule Take 100 mg by mouth 2 (two) times daily.   Marland Kitchen ELIQUIS 2.5 MG TABS tablet TAKE 1 TABLET BY MOUTH TWICE A DAY  . feeding supplement, ENSURE ENLIVE, (ENSURE ENLIVE) LIQD Take 237 mLs by mouth 3 (three) times daily between meals.  . furosemide (LASIX) 20 MG tablet Take 1 tablet (20 mg total) by mouth daily. (Patient taking differently: Take 10 mg by mouth daily. )  . Multiple Vitamins-Minerals (ICAPS AREDS 2 PO) Take 1 tablet by mouth 2 (two) times daily.   . nitroGLYCERIN (NITROSTAT) 0.4 MG SL tablet Place 1 tablet (0.4 mg total) under the tongue every 5 (five) minutes x 3 doses as needed for chest pain. Take only as needed for chest pain if your blood pressure is greater than 100/80.   No facility-administered encounter medications on file as of 07/31/2019.   Teodoro Spray, NP

## 2019-09-05 ENCOUNTER — Other Ambulatory Visit: Payer: Self-pay | Admitting: Cardiology

## 2019-09-05 MED ORDER — FUROSEMIDE 20 MG PO TABS: 20.0000 mg | ORAL_TABLET | Freq: Every day | ORAL | 0 refills | Status: DC

## 2019-09-05 NOTE — Telephone Encounter (Signed)
*  STAT* If patient is at the pharmacy, call can be transferred to refill team.   1. Which medications need to be refilled? (please list name of each medication and dose if known)  furosemide (LASIX) 20 MG tablet  2. Which pharmacy/location (including street and city if local pharmacy) is medication to be sent to? [id number] - [place], [place] - [address]  3. Do they need a 30 day or 90 day supply? 30  Patient would like Dr. Stanford Breed to write him an RX for 10 mg instead of 20 mg. He has a hard time cutting these pills in half.  This way if he ever needed to take 20 mg he could just take two pills instead of one

## 2019-09-11 ENCOUNTER — Other Ambulatory Visit: Payer: Self-pay

## 2019-09-11 ENCOUNTER — Other Ambulatory Visit: Payer: Medicare Other | Admitting: Hospice

## 2019-09-11 DIAGNOSIS — I5022 Chronic systolic (congestive) heart failure: Secondary | ICD-10-CM

## 2019-09-11 DIAGNOSIS — Z515 Encounter for palliative care: Secondary | ICD-10-CM

## 2019-09-11 NOTE — Progress Notes (Signed)
[facility] Palliative Care Consult Note Telephone: [phone]  Fax: [phone]  PATIENT NAME: Louis Reid DOB: [birth year] MRN: [id number]  PRIMARY CARE PROVIDER:   [name], MD  REFERRING PROVIDER:  [name], MD [place],  [place] [address]  RESPONSIBLE PARTY:Self [phone]  TELEHEALTH VISIT STATEMENT Due to the COVID-19 crisis, this visit was done via telephone from my office. It was initiated and consented to by this patient and/or family.  RECOMMENDATIONS/PLAN:  Advance Care Planning/Goals of Care: Telehealth visit  to build trust and follow up on palliative care. Patient is a full code withGoals of care includingto maximize quality of life and symptom management.  Symptom management:Patient reports a fall 10 days resulting in soreness/mild edema to left foot suspecting he had a muscle pull. He reports no more soreness, and edema improved significantly. He is voiding well, continuing with 10mg  lasix daily, with no cough, no SOB; No chest pain or need to use Nitroglycerine or pain medication. Education on elevation, falls precautions; he props up his feet during the day. He verbalized understanding to call if left foot edema worsens or soreness resumes, or with any concerns. On going fatigue r/t CHF at baseline; patient still independent in his daily activities; continuing to pace and rest between activities.  Patient had urine in blood, treated with Bactrim for UTI. PET scan and cystoscopy were negative; no more blood in urine or any urinary symptoms. ICD device checked at Cardiologist office 06/15/2019 - normal device function. Patient denies palpitations, chest pain/discomfot; continues on Eliquis as ordered. Patient continues on Lasix 10mg  daily and said it works very well for him, less nocturia. He continues onlifestyle modifications such as exercise as tolerated, reduced salt intake. He  reports taking 2 shots of COVID-19 vaccination. No acute concerns or complaints.  Follow [id number] care will continue to follow patient for goals of care clarification and symptom management. In person visit scheduled for 10/16/2019. I spent56 minutes providing this consultation; time includes chart review and documentation. More than 50% of the time in this consultation was spent on coordinating communication  HISTORY OF PRESENT ILLNESS:Louis R Jonesis a 86 y.o.year oldmalewith multiple medical problems including CAD, cardiomyopathy, unstable angina, HFrEF, edema, chronic systolic heart failure.Palliative Care was asked to help address goals of care    CODE STATUS: Full  PPS: 60% HOSPICE ELIGIBILITY/DIAGNOSIS: TBD  PAST MEDICAL HISTORY:  Past Medical History:  Diagnosis Date  . AICD (automatic cardioverter/defibrillator) present 09/13/2012  . Anemia   . CAD (coronary artery disease)    a. s/p remote CABG, b. AL STEMI c/b VF arrest in 05/2012 => LHC 05/10/12: Severe 3v CAD, S-OM1/2 ok, SVG-Dx ok, SVG-RCA occluded, LIMA-LAD atretic, proximal LAD occluded. EF 20% with anterior apical HK=> salvage PCI with POBA and thrombectomy of the occluded LAD;  c. 01/2017: atretic LIMA_LAD and known occluded SVG-RCA. Patent LAD stent and 70% proxRCA stenosis (FFR 0.88).   . Chronic systolic heart failure ([place])    a. Echo 12/13: EF 20-25%, anteroseptal, inferior, apical HK, mildly reduced RV function, trivial pericardial effusion;   b.  Echo 3/14:  Ant, septal, apical AK, EF 25%  . Clotting disorder ([place])   . Depression    "years ago" (02/28/2018)  . History of kidney stones   . HLD (hyperlipidemia)   . Hypertension    "I've never had high blood pressure; on RX for other reasons" (02/28/2018)  . Inguinal hernia    "have  one now on my left side" (09/13/2012); (02/28/2018)  . Ischemic cardiomyopathy   . Myocardial infarction [facility]) 1996;  05/2012  . Osteoporosis   . Paroxysmal atrial  fibrillation ([place]) 03/17/2016   noted on device interrogation,  will follow burden  . Pectus excavatum   . Pulmonary embolism ([place])    a. after adhesiolysis for SBO in 04/2012 => coumadin for 6 mos  . Renal cyst   . SBO (small bowel obstruction) [facility]) November 2013   s/p lysis of adhesions  . Sinoatrial node dysfunction ([facility])   . Skin cancer    "left ear; face" (02/28/2018)  . Ventricular fibrillation ([place]) 05/2012   . in setting of AL STEMI 12/13 => EF 20-25% => d/c on LifeVest (repeat echo planned in 08/2012)    SOCIAL HX:  Social History   Tobacco Use  . Smoking status: Former Smoker    Types: Cigarettes  . Smokeless tobacco: Never Used  . Tobacco comment: 09/14/2011; 02/28/2018  "quit smoking when I was ~ 17; probably didn't smoke 10 packs in my life"  Substance Use Topics  . Alcohol use: Never    ALLERGIES: No Known Allergies   PERTINENT MEDICATIONS:  Outpatient Encounter Medications as of 09/11/2019  Medication Sig  . atorvastatin (LIPITOR) 20 MG tablet TAKE 1 TABLET BY MOUTH  DAILY AT 6 PM (Patient not taking: Reported on 06/15/2019)  . cholecalciferol (VITAMIN D) 1000 units tablet Take 1,000 Units by mouth daily.  . cyanocobalamin 1000 MCG tablet Take 2,000 mcg by mouth daily.   Marland Kitchen docusate sodium (COLACE) 100 MG capsule Take 100 mg by mouth 2 (two) times daily.   Marland Kitchen ELIQUIS 2.5 MG TABS tablet TAKE 1 TABLET BY MOUTH TWICE A DAY  . feeding supplement, ENSURE ENLIVE, (ENSURE ENLIVE) LIQD Take 237 mLs by mouth 3 (three) times daily between meals.  . furosemide (LASIX) 20 MG tablet Take 1 tablet (20 mg total) by mouth daily.  . Multiple Vitamins-Minerals (ICAPS AREDS 2 PO) Take 1 tablet by mouth 2 (two) times daily.   . nitroGLYCERIN (NITROSTAT) 0.4 MG SL tablet Place 1 tablet (0.4 mg total) under the tongue every 5 (five) minutes x 3 doses as needed for chest pain. Take only as needed for chest pain if your blood pressure is greater than 100/80.   No facility-administered encounter  medications on file as of 09/11/2019.    Teodoro Spray, NP

## 2019-09-19 ENCOUNTER — Encounter: Payer: Self-pay | Admitting: Student

## 2019-09-19 ENCOUNTER — Other Ambulatory Visit: Payer: Self-pay

## 2019-09-19 ENCOUNTER — Ambulatory Visit (INDEPENDENT_AMBULATORY_CARE_PROVIDER_SITE_OTHER): Payer: Medicare Other | Admitting: Student

## 2019-09-19 VITALS — BP 132/82 | HR 89 | Ht 71.0 in | Wt 145.2 lb

## 2019-09-19 DIAGNOSIS — I2510 Atherosclerotic heart disease of native coronary artery without angina pectoris: Secondary | ICD-10-CM

## 2019-09-19 DIAGNOSIS — I10 Essential (primary) hypertension: Secondary | ICD-10-CM

## 2019-09-19 DIAGNOSIS — M25562 Pain in left knee: Secondary | ICD-10-CM

## 2019-09-19 DIAGNOSIS — I5022 Chronic systolic (congestive) heart failure: Secondary | ICD-10-CM | POA: Diagnosis not present

## 2019-09-19 DIAGNOSIS — Z95810 Presence of automatic (implantable) cardiac defibrillator: Secondary | ICD-10-CM | POA: Diagnosis not present

## 2019-09-19 DIAGNOSIS — M25462 Effusion, left knee: Secondary | ICD-10-CM

## 2019-09-19 DIAGNOSIS — I480 Paroxysmal atrial fibrillation: Secondary | ICD-10-CM

## 2019-09-19 NOTE — Patient Instructions (Addendum)
Medication Instructions:  Take Lasix 20 mg for next 3 days and then resume your normal dose. *If you need a refill on your cardiac medications before your next appointment, please call your pharmacy*   Lab Work: NONE If you have labs (blood work) drawn today and your tests are completely normal, you will receive your results only by: Marland Kitchen MyChart Message (if you have MyChart) OR . A paper copy in the mail If you have any lab test that is abnormal or we need to change your treatment, we will call you to review the results.   Testing/Procedures: NONE   Follow-Up: At [facility], you and your health needs are our priority.  As part of our continuing mission to provide you with exceptional heart care, we have created designated Provider Care Teams.  These Care Teams include your primary Cardiologist (physician) and Advanced Practice Providers (APPs -  Physician Assistants and Nurse Practitioners) who all work together to provide you with the care you need, when you need it.  We recommend signing up for the patient portal called "MyChart".  Sign up information is provided on this After Visit Summary.  MyChart is used to connect with patients for Virtual Visits (Telemedicine).  Patients are able to view lab/test results, encounter notes, upcoming appointments, etc.  Non-urgent messages can be sent to your provider as well.   To learn more about what you can do with MyChart, go to [email or web address].    SOMEONE WILL CALL YOU..   Device Clinic in 3 MONTHS   Your next appointment:   6 MONTHS  The format for your next appointment:   In Person  Provider:   [name], PA   Other Instructions  If left leg does not get any better, reach out to your PCP.

## 2019-09-19 NOTE — Progress Notes (Signed)
Electrophysiology Office Note Date: 09/19/2019  ID:  Louis Reid, DOB [birth year], MRN [id number]  PCP: [name], MD Primary Cardiologist: Dr. Haroldine Laws  Electrophysiologist: Dr. Rayann Heman  CC: Routine ICD follow-up  Louis Reid is a 86 y.o. male seen today for Dr. Rayann Heman.  They present today for routine electrophysiology followup.  Since last being seen in our clinic, the patient reports doing well overall. He is followed by hospice care/palliative care chronically. Overall doing well. Was changing a lightbulb a week or so ago and had a miss step coming down off the step tool. Hurt his left knee. Has had unilateral swelling since.  Takes lasix 10 mg daily. He has not missed any doses of his Elquis He denies chest pain, palpitations, dyspnea, PND, orthopnea, nausea, vomiting, dizziness, syncope, weight gain, or early satiety.  He has not had ICD shocks.   Device History: St. Jude Dual Chamber ICD implanted 09/2012 for chronic systolic CHF/ICM History of appropriate therapy: No History of AAD therapy: No   Past Medical History:  Diagnosis Date  . AICD (automatic cardioverter/defibrillator) present 09/13/2012  . Anemia   . CAD (coronary artery disease)    a. s/p remote CABG, b. AL STEMI c/b VF arrest in 05/2012 => LHC 05/10/12: Severe 3v CAD, S-OM1/2 ok, SVG-Dx ok, SVG-RCA occluded, LIMA-LAD atretic, proximal LAD occluded. EF 20% with anterior apical HK=> salvage PCI with POBA and thrombectomy of the occluded LAD;  c. 01/2017: atretic LIMA_LAD and known occluded SVG-RCA. Patent LAD stent and 70% proxRCA stenosis (FFR 0.88).   . Chronic systolic heart failure ([place])    a. Echo 12/13: EF 20-25%, anteroseptal, inferior, apical HK, mildly reduced RV function, trivial pericardial effusion;   b.  Echo 3/14:  Ant, septal, apical AK, EF 25%  . Clotting disorder ([place])   . Depression    "years ago" (02/28/2018)  . History of kidney stones   . HLD (hyperlipidemia)   . Hypertension      "I've never had high blood pressure; on RX for other reasons" (02/28/2018)  . Inguinal hernia    "have one now on my left side" (09/13/2012); (02/28/2018)  . Ischemic cardiomyopathy   . Myocardial infarction [facility]) 1996;  05/2012  . Osteoporosis   . Paroxysmal atrial fibrillation ([place]) 03/17/2016   noted on device interrogation,  will follow burden  . Pectus excavatum   . Pulmonary embolism ([place])    a. after adhesiolysis for SBO in 04/2012 => coumadin for 6 mos  . Renal cyst   . SBO (small bowel obstruction) [facility]) November 2013   s/p lysis of adhesions  . Sinoatrial node dysfunction (H[facility])   . Skin cancer    "left ear; face" (02/28/2018)  . Ventricular fibrillation ([place]) 05/2012   . in setting of AL STEMI 12/13 => EF 20-25% => d/c on LifeVest (repeat echo planned in 08/2012)   Past Surgical History:  Procedure Laterality Date  . CATARACT EXTRACTION W/ INTRAOCULAR LENS  IMPLANT, BILATERAL Bilateral   . COLONOSCOPY    . CORONARY ANGIOPLASTY WITH STENT PLACEMENT     "I've got 2 or 3 stents" (02/28/2018)  . CORONARY ARTERY BYPASS GRAFT  1996   "CABG X5" (09/13/2012)  . CYSTOSCOPY/RETROGRADE/URETEROSCOPY/STONE EXTRACTION WITH BASKET  1990's  . ESOPHAGOGASTRODUODENOSCOPY  05/08/2012   Procedure: ESOPHAGOGASTRODUODENOSCOPY (EGD);  Surgeon: Juanita Craver, MD;  Location[facility] ENDOSCOPY;  Service: Endoscopy;  Laterality: N/A;  Nevin Bloodgood put on for Dr. Collene Mares , Dr. Collene Mares will call if she wants another  time  . IMPLANTABLE CARDIOVERTER DEFIBRILLATOR IMPLANT N/A 09/13/2012   SJM Ellipse ER implanted by Dr Rayann Heman for secondary prevention  . INGUINAL HERNIA REPAIR Right 1996  . INTRAVASCULAR PRESSURE WIRE/FFR STUDY N/A 01/20/2017   Procedure: INTRAVASCULAR PRESSURE WIRE/FFR STUDY;  Surgeon: Wellington Hampshire, MD;  Location: [place] CV LAB;  Service: Cardiovascular;  Laterality: N/A;  . LAPAROTOMY  04/27/2012   Procedure: EXPLORATORY LAPAROTOMY;  Surgeon: Gwenyth Ober, MD;  Location: [facility];  Service: General;   Laterality: N/A;  . LEFT HEART CATH AND CORS/GRAFTS ANGIOGRAPHY N/A 01/20/2017   Procedure: LEFT HEART CATH AND CORS/GRAFTS ANGIOGRAPHY;  Surgeon: Wellington Hampshire, MD;  Location: [place] CV LAB;  Service: Cardiovascular;  Laterality: N/A;  . LEFT HEART CATHETERIZATION WITH CORONARY ANGIOGRAM N/A 05/10/2012   Procedure: LEFT HEART CATHETERIZATION WITH CORONARY ANGIOGRAM;  Surgeon: Burnell Blanks, MD;  Location: [facility] CATH LAB;  Service: Cardiovascular;  Laterality: N/A;  . PTCA     percutaneous transluminal coronary intervention and brachy therapy, Bruce R. Olevia Perches, MD. EF 60%  . RIGHT HEART CATH N/A 03/01/2018   Procedure: RIGHT HEART CATH;  Surgeon: Jolaine Artist, MD;  Location: [place] CV LAB;  Service: Cardiovascular;  Laterality: N/A;  . SKIN CANCER EXCISION     "left ear; face" (02/28/2018)  . TRANSURETHRAL RESECTION OF PROSTATE      Current Outpatient Medications  Medication Sig Dispense Refill  . cholecalciferol (VITAMIN D) 1000 units tablet Take 1,000 Units by mouth daily.    . cyanocobalamin 1000 MCG tablet Take 2,000 mcg by mouth daily.     Marland Kitchen docusate sodium (COLACE) 100 MG capsule Take 100 mg by mouth 2 (two) times daily.     Marland Kitchen ELIQUIS 2.5 MG TABS tablet TAKE 1 TABLET BY MOUTH TWICE A DAY 60 tablet 5  . feeding supplement, ENSURE ENLIVE, (ENSURE ENLIVE) LIQD Take 237 mLs by mouth 3 (three) times daily between meals. 237 mL 12  . finasteride (PROSCAR) 5 MG tablet Take 5 mg by mouth daily.    . furosemide (LASIX) 20 MG tablet Take 1 tablet (20 mg total) by mouth daily. 90 tablet 0  . Multiple Vitamins-Minerals (ICAPS AREDS 2 PO) Take 1 tablet by mouth 2 (two) times daily.     . nitroGLYCERIN (NITROSTAT) 0.4 MG SL tablet Place 1 tablet (0.4 mg total) under the tongue every 5 (five) minutes x 3 doses as needed for chest pain. Take only as needed for chest pain if your blood pressure is greater than 100/80. 25 tablet 12   No current facility-administered medications  for this visit.    Allergies:   Patient has no known allergies.   Social History: Social History   Socioeconomic History  . Marital status: Widowed    Spouse name: Not on file  . Number of children: 1  . Years of education: Not on file  . Highest education level: Not on file  Occupational History  . Occupation: [name]    Employer: RETIRED  Tobacco Use  . Smoking status: Former Smoker    Types: Cigarettes  . Smokeless tobacco: Never Used  . Tobacco comment: 09/14/2011; 02/28/2018  "quit smoking when I was ~ 17; probably didn't smoke 10 packs in my life"  Substance and Sexual Activity  . Alcohol use: Never  . Drug use: Never  . Sexual activity: Not Currently  Other Topics Concern  . Not on file  Social History Narrative  . Not on file  Social Determinants of Health   Financial Resource Strain:   . Difficulty of Paying Living Expenses:   Food Insecurity:   . Worried About Charity fundraiser in the Last Year:   . Arboriculturist in the Last Year:   Transportation Needs:   . Film/video editor (Medical):   Marland Kitchen Lack of Transportation (Non-Medical):   Physical Activity:   . Days of Exercise per Week:   . Minutes of Exercise per Session:   Stress:   . Feeling of Stress :   Social Connections:   . Frequency of Communication with Friends and Family:   . Frequency of Social Gatherings with Friends and Family:   . Attends Religious Services:   . Active Member of Clubs or Organizations:   . Attends Archivist Meetings:   Marland Kitchen Marital Status:   Intimate Partner Violence:   . Fear of Current or Ex-Partner:   . Emotionally Abused:   Marland Kitchen Physically Abused:   . Sexually Abused:     Family History: Family History  Problem Relation Age of Onset  . Heart disease Father   . Other Mother        brain tumor  . Colon cancer Brother   . Cancer Brother        Liver  . Heart disease Brother     Review of Systems: All other systems reviewed and are  otherwise negative except as noted above.   Physical Exam: Vitals:   09/19/19 0942  BP: 132/82  Pulse: 89  SpO2: 98%  Weight: 145 lb 3.2 oz (65.9 kg)  Height: 5\' 11"  (1.803 m)     GEN- The patient is well appearing, alert and oriented x 3 today.   HEENT: normocephalic, atraumatic; sclera clear, conjunctiva pink; hearing intact; oropharynx clear; neck supple, no JVP Lymph- no cervical lymphadenopathy Lungs- Clear to ausculation bilaterally, normal work of breathing.  No wheezes, rales, rhonchi Heart- Regular rate and rhythm, no murmurs, rubs or gallops, PMI not laterally displaced GI- soft, non-tender, non-distended, bowel sounds present, no hepatosplenomegaly Extremities- no clubbing, cyanosis, or edema; DP/PT/radial pulses 2+ bilaterally MS- no significant deformity or atrophy Skin- warm and dry, no rash or lesion; ICD pocket well healed Psych- euthymic mood, full affect Neuro- strength and sensation are intact  ICD interrogation- reviewed in detail today,  See PACEART report  EKG:  EKG is ordered today. The ekg ordered today shows NSR at 89 bpm with frequent ectopy.   Recent Labs: No results found for requested labs within last 8760 hours.   Wt Readings from Last 3 Encounters:  09/19/19 145 lb 3.2 oz (65.9 kg)  10/03/18 129 lb (58.5 kg)  08/08/18 137 lb (62.1 kg)     Other studies Reviewed: Additional studies/ records that were reviewed today include: previous EP office, device clinic notes, remote checks, most recent labs, HF clinic notes.   Assessment and Plan:  1.  Chronic systolic dysfunction s/p St. Jude dual chamber ICD  euvolemic today Stable on an appropriate medical regimen Normal ICD function See Pace Art report No changes today  2. HTN Stable  3. Afib Well controlled Continue Eliquis. He is appropriately dosed at 2.5 mg BID. Age > 80 and baseline weight <  60 kg.   4. Left knee swelling/dependent edema Likely in the setting of knee injury, as he  is on Eliquis without missing doses Increase lasix from 10 mg to 20 mg daily x 3 days Follow up with PCP  if not improved for further evaluation of knee Encouraged compression hose.   Current medicines are reviewed at length with the patient today.   The patient does not have concerns regarding his medicines.  The following changes were made today:  none  Labs/ tests ordered today include:  Orders Placed This Encounter  Procedures  . EKG 12-Lead     Disposition:   Follow up with device clinic 3 months for in person check. EP app in 6 months.   Jacalyn Lefevre, [name]  09/19/2019 10:22 AM  [facility] [address] [place] [address] [phone] (office) [phone] (fax)

## 2019-09-21 LAB — CUP PACEART INCLINIC DEVICE CHECK
Battery Remaining Longevity: 36 mo
Brady Statistic RA Percent Paced: 13 %
Brady Statistic RV Percent Paced: 0.2 %
Date Time Interrogation Session: 20210413101853
HighPow Impedance: 56.25 Ohm
Implantable Lead Implant Date: 20140408
Implantable Lead Implant Date: 20140408
Implantable Lead Location: 753859
Implantable Lead Location: 753860
Implantable Pulse Generator Implant Date: 20140408
Lead Channel Impedance Value: 350 Ohm
Lead Channel Impedance Value: 362.5 Ohm
Lead Channel Pacing Threshold Amplitude: 0.5 V
Lead Channel Pacing Threshold Amplitude: 0.5 V
Lead Channel Pacing Threshold Amplitude: 0.75 V
Lead Channel Pacing Threshold Amplitude: 0.75 V
Lead Channel Pacing Threshold Pulse Width: 0.5 ms
Lead Channel Pacing Threshold Pulse Width: 0.5 ms
Lead Channel Pacing Threshold Pulse Width: 0.5 ms
Lead Channel Pacing Threshold Pulse Width: 0.5 ms
Lead Channel Sensing Intrinsic Amplitude: 11.7 mV
Lead Channel Sensing Intrinsic Amplitude: 3.5 mV
Lead Channel Setting Pacing Amplitude: 2 V
Lead Channel Setting Pacing Amplitude: 2.5 V
Lead Channel Setting Pacing Pulse Width: 0.5 ms
Lead Channel Setting Sensing Sensitivity: 0.5 mV
Pulse Gen Serial Number: 7094248

## 2019-09-28 NOTE — Progress Notes (Signed)
Cardiology Office Note   Date:  10/02/2019   ID:  [place], Minten 1933/05/26, MRN [id number]  PCP:  [name], MD  Cardiologist: [facility] Advanced Heart Failure: Dr.Bensimhon EP:  Dr. Rayann Heman No chief complaint on file.    History of Present Illness: Louis Reid is a 86 y.o. male who presents for  for ongoing assessment and management of coronary artery disease with history of ischemic cardiomyopathy.  Patient has a history of CABG in 1996.  The patient also had an anterior infarct in December 0000000 complicated by VT arrest.  Patient had PCI of his LAD, with hospital course complicated by hypoxic encephalopathy.  Other history includes ICD implantation on 4/14.  He has been followed by Dr. Haroldine Laws primarily for his advanced CHF. Cardiac catheterization in August 2018 revealed severe three-vessel CAD with patent SVG to diagonal, patent SVG to second and third marginals, patient LAD stent, atretic LIMA to LAD and occluded SVG to RCA.  There was a 70% proximal right coronary artery stenosis and FFR was 0.88.  At that time cardiac medications were discontinued due to hypotension.  He was seen last by Dr. Haroldine Laws on 11/21/2018 and was not found to be a candidate for VAD. A discussion of RHC and palliative milrinone was had, but patient wanted to hold of on this. He was taken off of lisinopril, continued on lasix, and was placed on BB due to low output. He was continued on Eliquis for hx of PAF.   He is followed by Dr. Rayann Heman and was seen by [name], PA, on 09/19/2019 for ICD check and routine EP follow up.  He offered no cardiac complaints. He reported that he fell off of a step stool after coming down from changing a light bulb.  He missed a step. He injured his left knee.   ICD interrogation revealed normal ICD function.  BP was stable and he was found to be euvolemic. He was continued on Eliquis 2.5 mg BID. Due to left knee swelling, lasix was temporarily increased to 20  mg for 3 days and then return to 10 mg daily thereafter.   He comes today with multiple questions.  He is now being treated for cellulitis in the left lower leg below the knee after injuring his left knee from fall as described above.  He has had recent lab work by Dr. Arelia Sneddon last week we are requesting results.  He denies any chest pain dyspnea on exertion or fatigue.  He states that he is under a lot of pressure with some family issues and his blood pressure is reflective of that.  Normally it is much lower.  He comes today without any new complaints.  He discusses his fall and also wanted me to check out his left knee to make sure that it looked okay after the fall even though he has been seen by his primary care physician.  Past Medical History:  Diagnosis Date  . AICD (automatic cardioverter/defibrillator) present 09/13/2012  . Anemia   . CAD (coronary artery disease)    a. s/p remote CABG, b. AL STEMI c/b VF arrest in 05/2012 => LHC 05/10/12: Severe 3v CAD, S-OM1/2 ok, SVG-Dx ok, SVG-RCA occluded, LIMA-LAD atretic, proximal LAD occluded. EF 20% with anterior apical HK=> salvage PCI with POBA and thrombectomy of the occluded LAD;  c. 01/2017: atretic LIMA_LAD and known occluded SVG-RCA. Patent LAD stent and 70% proxRCA stenosis (FFR 0.88).   . Chronic systolic heart failure ([place])  a. Echo 12/13: EF 20-25%, anteroseptal, inferior, apical HK, mildly reduced RV function, trivial pericardial effusion;   b.  Echo 3/14:  Ant, septal, apical AK, EF 25%  . Clotting disorder ([place])   . Depression    "years ago" (02/28/2018)  . History of kidney stones   . HLD (hyperlipidemia)   . Hypertension    "I've never had high blood pressure; on RX for other reasons" (02/28/2018)  . Inguinal hernia    "have one now on my left side" (09/13/2012); (02/28/2018)  . Ischemic cardiomyopathy   . Myocardial infarction [facility]) 1996;  05/2012  . Osteoporosis   . Paroxysmal atrial fibrillation ([place]) 03/17/2016   noted on  device interrogation,  will follow burden  . Pectus excavatum   . Pulmonary embolism ([employer])    a. after adhesiolysis for SBO in 04/2012 => coumadin for 6 mos  . Renal cyst   . SBO (small bowel obstruction) [facility]) November 2013   s/p lysis of adhesions  . Sinoatrial node dysfunction (HCC)   . Skin cancer    "left ear; face" (02/28/2018)  . Ventricular fibrillation ([place]) 05/2012   . in setting of AL STEMI 12/13 => EF 20-25% => d/c on LifeVest (repeat echo planned in 08/2012)    Past Surgical History:  Procedure Laterality Date  . CATARACT EXTRACTION W/ INTRAOCULAR LENS  IMPLANT, BILATERAL Bilateral   . COLONOSCOPY    . CORONARY ANGIOPLASTY WITH STENT PLACEMENT     "I've got 2 or 3 stents" (02/28/2018)  . CORONARY ARTERY BYPASS GRAFT  1996   "CABG X5" (09/13/2012)  . CYSTOSCOPY/RETROGRADE/URETEROSCOPY/STONE EXTRACTION WITH BASKET  1990's  . ESOPHAGOGASTRODUODENOSCOPY  05/08/2012   Procedure: ESOPHAGOGASTRODUODENOSCOPY (EGD);  Surgeon: Juanita Craver, MD;  Location: [facility] ENDOSCOPY;  Service: Endoscopy;  Laterality: N/A;  Nevin Bloodgood put on for Dr. Collene Mares , Dr. Collene Mares will call if she wants another time  . IMPLANTABLE CARDIOVERTER DEFIBRILLATOR IMPLANT N/A 09/13/2012   SJM Ellipse ER implanted by Dr Rayann Heman for secondary prevention  . INGUINAL HERNIA REPAIR Right 1996  . INTRAVASCULAR PRESSURE WIRE/FFR STUDY N/A 01/20/2017   Procedure: INTRAVASCULAR PRESSURE WIRE/FFR STUDY;  Surgeon: Wellington Hampshire, MD;  Location: [place] CV LAB;  Service: Cardiovascular;  Laterality: N/A;  . LAPAROTOMY  04/27/2012   Procedure: EXPLORATORY LAPAROTOMY;  Surgeon: Gwenyth Ober, MD;  Location: [place];  Service: General;  Laterality: N/A;  . LEFT HEART CATH AND CORS/GRAFTS ANGIOGRAPHY N/A 01/20/2017   Procedure: LEFT HEART CATH AND CORS/GRAFTS ANGIOGRAPHY;  Surgeon: Wellington Hampshire, MD;  Location: [place] CV LAB;  Service: Cardiovascular;  Laterality: N/A;  . LEFT HEART CATHETERIZATION WITH CORONARY ANGIOGRAM N/A 05/10/2012     Procedure: LEFT HEART CATHETERIZATION WITH CORONARY ANGIOGRAM;  Surgeon: Burnell Blanks, MD;  Location: [facility] CATH LAB;  Service: Cardiovascular;  Laterality: N/A;  . PTCA     percutaneous transluminal coronary intervention and brachy therapy, Bruce R. Olevia Perches, MD. EF 60%  . RIGHT HEART CATH N/A 03/01/2018   Procedure: RIGHT HEART CATH;  Surgeon: Jolaine Artist, MD;  Location: [place] CV LAB;  Service: Cardiovascular;  Laterality: N/A;  . SKIN CANCER EXCISION     "left ear; face" (02/28/2018)  . TRANSURETHRAL RESECTION OF PROSTATE       Current Outpatient Medications  Medication Sig Dispense Refill  . cholecalciferol (VITAMIN D) 1000 units tablet Take 1,000 Units by mouth daily.    . cyanocobalamin 1000 MCG tablet Take 2,000 mcg by mouth daily.     Marland Kitchen  docusate sodium (COLACE) 100 MG capsule Take 100 mg by mouth 2 (two) times daily.     Marland Kitchen ELIQUIS 2.5 MG TABS tablet TAKE 1 TABLET BY MOUTH TWICE A DAY 60 tablet 5  . feeding supplement, ENSURE ENLIVE, (ENSURE ENLIVE) LIQD Take 237 mLs by mouth 3 (three) times daily between meals. 237 mL 12  . finasteride (PROSCAR) 5 MG tablet Take 5 mg by mouth daily.    . furosemide (LASIX) 20 MG tablet Take 1 tablet (20 mg total) by mouth daily. 90 tablet 0  . Multiple Vitamins-Minerals (ICAPS AREDS 2 PO) Take 1 tablet by mouth 2 (two) times daily.     . nitroGLYCERIN (NITROSTAT) 0.4 MG SL tablet Place 1 tablet (0.4 mg total) under the tongue every 5 (five) minutes x 3 doses as needed for chest pain. Take only as needed for chest pain if your blood pressure is greater than 100/80. 25 tablet 12  . sulfamethoxazole-trimethoprim (BACTRIM DS) 800-160 MG tablet Take 1 tablet by mouth 2 (two) times daily.     No current facility-administered medications for this visit.    Allergies:   Patient has no known allergies.    Social History:  The patient  reports that he has quit smoking. His smoking use included cigarettes. He has never used smokeless  tobacco. He reports that he does not drink alcohol or use drugs.   Family History:  The patient's family history includes Cancer in his brother; Colon cancer in his brother; Heart disease in his brother and father; Other in his mother.    ROS: All other systems are reviewed and negative. Unless otherwise mentioned in H&P    PHYSICAL EXAM: VS:  BP (!) 150/88   Pulse 92   Ht 5\' 11"  (1.803 m)   Wt 138 lb 6.4 oz (62.8 kg)   SpO2 96%   BMI 19.30 kg/m  , BMI Body mass index is 19.3 kg/m. GEN: Well nourished, well developed, in no acute distress HEENT: normal Neck: no JVD, carotid bruits, or masses Cardiac: IRRR; 1/6 systolic murmurs, rubs, or gallops,no edema  Respiratory:  Clear to auscultation bilaterally, normal work of breathing GI: soft, nontender, nondistended, + BS MS: no deformity or atrophy, sternal concavity with kyphosis noted.  Redness and erythema of the left lower leg below the knee.  Mild edema. Skin: warm and dry, no rash Neuro:  Strength and sensation are intact Psych: euthymic mood, full affect   EKG: Not completed this office visit   Recent Labs: No results found for requested labs within last 8760 hours.    Lipid Panel    Component Value Date/Time   CHOL 89 (L) 08/24/2016 0931   TRIG 65 08/24/2016 0931   HDL 37 (L) 08/24/2016 0931   CHOLHDL 2.4 08/24/2016 0931   CHOLHDL 2.4 07/29/2015 0837   VLDL 15 07/29/2015 0837   LDLCALC 39 08/24/2016 0931      Wt Readings from Last 3 Encounters:  10/02/19 138 lb 6.4 oz (62.8 kg)  09/19/19 145 lb 3.2 oz (65.9 kg)  10/03/18 129 lb (58.5 kg)      Other studies Reviewed: Right Heart Cath Conclusion 03/01/2018    Findings:  RA = 2 RV = 25/1 PA = 20/2 (9) PCW = 1 Fick cardiac output/index = 4.1/2.5 PVR = 2.0 WU FA sat = 98% PA sat = 70%, 72% SVC sat = 72%  Assessment: 1. Very low filling pressures with mildly decreased CO  Plan/Discussion:  Hydrate gently. Hold diuretics.  Glori Bickers, MD  10:26 AM      ASSESSMENT AND PLAN:  1.  Combined systolic and diastolic heart failure: Followed by the advanced heart failure clinic, Dr. Haroldine Laws  He does not have evidence of fluid overload on this office visit.  Weight is actually lower than previous weight on Lasix 20 mg daily.  Recent labs have been completed through his primary care physician's office.  We are requesting results.  No changes in his medication regimen at this time.  He has not seen Dr. Haroldine Laws in the advanced heart failure clinic in over a year.  I will make sure he follows up with an appointment there for ongoing management.  Continue him on current diuretics as he does not have evidence of volume overload today.  2.  Coronary artery disease: History of ST elevated MI, CABG, stent to the LAD.  He continues on Eliquis.  Is not on aspirin currently.  He denies recurrent chest discomfort.  3.  Hypertension: Elevated not optimal for his reduced EF.  I have rechecked it in the office and it was 128/62.  He states he is under a lot of pressure due to financial constraints and other family issues.   Current medicines are reviewed at length with the patient today.  I have spent 25 minutes in dedicated to the care of this patient on the date of this encounter to include pre-visit review of records, assessment, management and diagnostic testing,with shared decision making.  Labs/ tests ordered today include: None.  Requesting from PCP Phill Myron. [place] Pugh, ANP, AACC   10/02/2019 9:59 AM    [place] [address] Office [phone] Fax [phone]  Notice: This dictation was prepared with Dragon dictation along with smaller phrase technology. Any transcriptional errors that result from this process are unintentional and may not be corrected upon review.

## 2019-10-02 ENCOUNTER — Telehealth: Payer: Self-pay | Admitting: Cardiology

## 2019-10-02 ENCOUNTER — Encounter: Payer: Self-pay | Admitting: Adult Health

## 2019-10-02 ENCOUNTER — Other Ambulatory Visit: Payer: Self-pay

## 2019-10-02 ENCOUNTER — Ambulatory Visit: Payer: Medicare Other | Admitting: Adult Health

## 2019-10-02 VITALS — BP 150/88 | HR 92 | Ht 71.0 in | Wt 138.4 lb

## 2019-10-02 DIAGNOSIS — Z95810 Presence of automatic (implantable) cardiac defibrillator: Secondary | ICD-10-CM

## 2019-10-02 DIAGNOSIS — I10 Essential (primary) hypertension: Secondary | ICD-10-CM

## 2019-10-02 DIAGNOSIS — I255 Ischemic cardiomyopathy: Secondary | ICD-10-CM | POA: Diagnosis not present

## 2019-10-02 DIAGNOSIS — I480 Paroxysmal atrial fibrillation: Secondary | ICD-10-CM | POA: Diagnosis not present

## 2019-10-02 NOTE — Patient Instructions (Signed)
Medication Instructions:  Continue current medications  *If you need a refill on your cardiac medications before your next appointment, please call your pharmacy*   Lab Work: None Ordered   Testing/Procedures: None Ordered   Follow-Up: At [facility], you and your health needs are our priority.  As part of our continuing mission to provide you with exceptional heart care, we have created designated Provider Care Teams.  These Care Teams include your primary Cardiologist (physician) and Advanced Practice Providers (APPs -  Physician Assistants and Nurse Practitioners) who all work together to provide you with the care you need, when you need it.  We recommend signing up for the patient portal called "MyChart".  Sign up information is provided on this After Visit Summary.  MyChart is used to connect with patients for Virtual Visits (Telemedicine).  Patients are able to view lab/test results, encounter notes, upcoming appointments, etc.  Non-urgent messages can be sent to your provider as well.   To learn more about what you can do with MyChart, go to [email or web address].    Your next appointment:   6 month(s)  The format for your next appointment:   In Person  Provider:   You may see Kirk Ruths, MD or one of the following Advanced Practice Providers on your designated Care Team:    Kerin Ransom, [name]  [place], [place]  Coletta Memos, [place]    Other Instructions 3 Months with Dr Haroldine Laws

## 2019-10-02 NOTE — Telephone Encounter (Signed)
Patient states that he received a call from our office. I did not see any notes but he thinks it was Hilda Blades, Dr. Jacalyn Lefevre nurse.

## 2019-10-02 NOTE — Telephone Encounter (Signed)
Spoke with pt, aware to give [employer] a call at [phone]. The patient has not received any medication and the company needed to talk to him.

## 2019-10-13 ENCOUNTER — Other Ambulatory Visit (HOSPITAL_COMMUNITY): Payer: Self-pay | Admitting: Internal Medicine

## 2019-10-16 ENCOUNTER — Other Ambulatory Visit: Payer: Self-pay

## 2019-10-16 ENCOUNTER — Other Ambulatory Visit: Payer: Medicare Other | Admitting: Hospice

## 2019-10-16 DIAGNOSIS — Z515 Encounter for palliative care: Secondary | ICD-10-CM

## 2019-10-16 DIAGNOSIS — I5022 Chronic systolic (congestive) heart failure: Secondary | ICD-10-CM

## 2019-10-16 NOTE — Progress Notes (Signed)
[facility] Consult Note Telephone: [phone]  Fax: [phone]  PATIENT NAME: Louis Reid DOB: 12-28-1932 MRN: [id number]  PRIMARY CARE PROVIDER:   [name], MD  REFERRING PROVIDER: [name], MD  RESPONSIBLE PARTY:Self [phone]    RECOMMENDATIONS/PLAN:  Advance Care Planning/Goals of Care: Visit to build trust and follow up on palliative care. Patientisa Full code withGoals of care includingto maximize quality of life and symptom management. 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 His late brother was a [employer] Nurse, children's (highly decorated [place]), and patient was recently interviewed on [place] about his late brother. Therapeutic listening and validation provided. Patient brought his MOST form for review: selections include full scope of treatment and feeding tube for a defined period of time.  He verbalized understanding that he could make changes to his CODE STATUS and to the MOST form as needed. Symptom management:Patient says he has no pain, he sleeps good and no discomfort. He endorsed ongoing weakness and has loss of taste is which he thinks is related to aging.Appetite is fair to good during he eats little at that time.  Encouraged four to five times meals a day as he does not eat much at a time. He is voiding well, continuing with 10mg  lasix daily, with no cough, no SOB; No chest pain or need to use Nitroglycerine or pain medication.. On going fatigue r/t CHF at baseline; patient still independent in his daily activities; continuing to pace and rest between activities.  Review indicates patient was recently treated for cellulitis of left lower leg following a fall.  Cellulitis resolved.  Recent PET scan and cystoscopy were negative; no more blood in urine or  any urinary symptoms.ICD device checked at Cardiologist office 06/15/2019 - normal device function. Patient denies palpitations, chest pain/discomfot; continues on Eliquis as ordered.He continues onlifestyle modifications such as exercise as tolerated, reduced salt intake. He reports taking 2 shots of COVID-19 vaccination. Patient wanted to know if he should go for large crowd funeral for some distant family member. He was advised considering his advanced age and co morbidities to not go if it is possible., He said he thought so too. No acute concerns or complaints. Encouraged ongoing care. Follow [id number] care will continue to follow patient for goals of care clarification and symptom management. In person visit scheduled for 10/16/2019. I spent1 hour and 20 minutes minutes providing this consultation; time includes chart review and documentation. More than 50% of the time in this consultation was spent on coordinating communication  HISTORY OF PRESENT ILLNESS:Louis R Jonesis a 86 y.o.year oldmalewith multiple medical problems including CAD, cardiomyopathy, unstable angina, HFrEF, edema, chronic systolic heart failure.Palliative Care was asked to help address goals of care  CODE STATUS: Full  PPS: 60% HOSPICE ELIGIBILITY/DIAGNOSIS: TBD  PAST MEDICAL HISTORY:  Past Medical History:  Diagnosis Date  . AICD (automatic cardioverter/defibrillator) present 09/13/2012  . Anemia   . CAD (coronary artery disease)    a. s/p remote CABG, b. AL STEMI c/b VF arrest in 05/2012 => LHC 05/10/12: Severe 3v CAD, S-OM1/2 ok, SVG-Dx ok, SVG-RCA occluded, LIMA-LAD atretic, proximal LAD occluded. EF 20% with anterior apical HK=> salvage PCI with POBA and thrombectomy of the occluded LAD;  c. 01/2017: atretic LIMA_LAD and known occluded SVG-RCA. Patent LAD stent and 70% proxRCA  stenosis (FFR 0.88).   . Chronic systolic heart failure ([place])    a. Echo 12/13: EF 20-25%, anteroseptal, inferior, apical HK,  mildly reduced RV function, trivial pericardial effusion;   b.  Echo 3/14:  Ant, septal, apical AK, EF 25%  . Clotting disorder ([place])   . Depression    "years ago" (02/28/2018)  . History of kidney stones   . HLD (hyperlipidemia)   . Hypertension    "I've never had high blood pressure; on RX for other reasons" (02/28/2018)  . Inguinal hernia    "have one now on my left side" (09/13/2012); (02/28/2018)  . Ischemic cardiomyopathy   . Myocardial infarction [facility]) 1996;  05/2012  . Osteoporosis   . Paroxysmal atrial fibrillation ([place]) 03/17/2016   noted on device interrogation,  will follow burden  . Pectus excavatum   . Pulmonary embolism ([place])    a. after adhesiolysis for SBO in 04/2012 => coumadin for 6 mos  . Renal cyst   . SBO (small bowel obstruction) [facility]) November 2013   s/p lysis of adhesions  . Sinoatrial node dysfunction (HCC)   . Skin cancer    "left ear; face" (02/28/2018)  . Ventricular fibrillation ([place]) 05/2012   . in setting of AL STEMI 12/13 => EF 20-25% => d/c on LifeVest (repeat echo planned in 08/2012)    SOCIAL HX:  Social History   Tobacco Use  . Smoking status: Former Smoker    Types: Cigarettes  . Smokeless tobacco: Never Used  . Tobacco comment: 09/14/2011; 02/28/2018  "quit smoking when I was ~ 17; probably didn't smoke 10 packs in my life"  Substance Use Topics  . Alcohol use: Never    ALLERGIES: No Known Allergies   PERTINENT MEDICATIONS:  Outpatient Encounter Medications as of 10/16/2019  Medication Sig  . cholecalciferol (VITAMIN D) 1000 units tablet Take 1,000 Units by mouth daily.  . cyanocobalamin 1000 MCG tablet Take 2,000 mcg by mouth daily.   Marland Kitchen docusate sodium (COLACE) 100 MG capsule Take 100 mg by mouth 2 (two) times daily.   Marland Kitchen ELIQUIS 2.5 MG TABS tablet TAKE 1 TABLET BY MOUTH TWICE A DAY  . feeding supplement, ENSURE ENLIVE, (ENSURE ENLIVE) LIQD Take 237 mLs by mouth 3 (three) times daily between meals.  . finasteride (PROSCAR) 5 MG tablet Take 5  mg by mouth daily.  . furosemide (LASIX) 20 MG tablet Take 1 tablet (20 mg total) by mouth daily.  . Multiple Vitamins-Minerals (ICAPS AREDS 2 PO) Take 1 tablet by mouth 2 (two) times daily.   . nitroGLYCERIN (NITROSTAT) 0.4 MG SL tablet Place 1 tablet (0.4 mg total) under the tongue every 5 (five) minutes x 3 doses as needed for chest pain. Take only as needed for chest pain if your blood pressure is greater than 100/80.  Marland Kitchen sulfamethoxazole-trimethoprim (BACTRIM DS) 800-160 MG tablet Take 1 tablet by mouth 2 (two) times daily.   No facility-administered encounter medications on file as of 10/16/2019.    PHYSICAL EXAM/ROS:  General: NAD, frail appearing, thin Cardiovascular: regular rate and rhythm; denies chest pain Pulmonary: clear ant fields/post; no coughing, no shortness of breath; no adventitious lung sounds, normal respiratory effort Abdomen: soft, nontender, + bowel sounds GU: no suprapubic tenderness Extremities: no edema, no joint deformities Skin: no rashes to exposed skin Neurological: Weakness but otherwise nonfocal  Teodoro Spray, NP

## 2019-11-21 ENCOUNTER — Telehealth: Payer: Self-pay

## 2019-11-21 NOTE — Telephone Encounter (Signed)
All to patient and son to cancel and reschedule palliative care visit that is scheduled for Monday 6/21. No answer and unable to leave message.  Will attempt again.

## 2019-12-18 ENCOUNTER — Other Ambulatory Visit: Payer: Medicare Other | Admitting: Hospice

## 2019-12-18 ENCOUNTER — Other Ambulatory Visit: Payer: Self-pay

## 2019-12-18 DIAGNOSIS — I5022 Chronic systolic (congestive) heart failure: Secondary | ICD-10-CM

## 2019-12-18 DIAGNOSIS — Z515 Encounter for palliative care: Secondary | ICD-10-CM

## 2019-12-18 NOTE — Progress Notes (Signed)
[facility] Consult Note Telephone: [phone]  Fax: [phone]  PATIENT NAME: Louis Reid DOB: 11-19-1932 MRN: [id number]  PRIMARY CARE PROVIDER:   [name], MD  REFERRING PROVIDER: [name], MD  RESPONSIBLE PARTY:[phone]  TELEHEALTH VISIT STATEMENT Due to the COVID-19 crisis, this visit was done via telephone from my office. It was initiated and consented to by this patient and/or family.   RECOMMENDATIONS/PLAN:  Advance Care Planning/Goals of Care: Visit to build trust and follow up on palliative care. Patient remainsa Full code withGoals of care includingto maximize quality of life and symptom management.  MOST selections include full scope of treatment and feeding tube for a defined period of time.  Symptom management:Patient denies pain and continues to sleep good; appetite is fair and he continues to eat small meals 4-5 times daily as needed.  He is continuing with 10mg  lasix daily, with no cough, no SOB; No chest pain or need to use Nitroglycerine or pain medication..On going fatigue r/t CHF at baseline; patient still independent in his daily activities; continuing to pace and rest between activities.   He reports he gets short of breath when he goes to get his mails from his; he is requiring more rest in between activities than before.  He has appointment with PCP next week for comprehensive lab work.  He plans to request physical therapy from PCP for strengthening.  He denies edema to bilateral lower extremities and no difficulty breathing during visit.     Patient with history of blood in urine-recent PET scan and cystoscopy were negative;ICD device checked at Cardiologist office 06/15/2019 - normal device function. Patient denies palpitations, chest pain/discomfort; continues on Eliquis as ordered.He continues onlifestyle modifications such as exercise as tolerated, reduced salt  intake. He reports taking 2 shots of COVID-19 vaccination.   Patient continues on Covid precautions-staying away from large crowds of individuals, using mask as needed, hand hygiene etc. No acute concerns or complaints. He is compliant with his medications and keeps appointments with his medical team. Encouraged ongoing care. Follow KG:[id number] care will continue to follow patient for goals of care clarification and symptom management.In person visit scheduled for 01/22/2020. I spent46 minutesproviding this consultation; time includes chart review and documentation.More than 50% of the time in this consultation was spent on coordinating communication  HISTORY OF PRESENT ILLNESS:Louis R Jonesis a 86 y.o.year oldmalewith multiple medical problems including CAD, cardiomyopathy, unstable angina, HFrEF, edema, chronic systolic heart failure.Palliative Care was asked to help address goals of care  CODE STATUS: Full  PPS: 60% HOSPICE ELIGIBILITY/DIAGNOSIS: TBD  PAST MEDICAL HISTORY:  Past Medical History:  Diagnosis Date  . AICD (automatic cardioverter/defibrillator) present 09/13/2012  . Anemia   . CAD (coronary artery disease)    a. s/p remote CABG, b. AL STEMI c/b VF arrest in 05/2012 => LHC 05/10/12: Severe 3v CAD, S-OM1/2 ok, SVG-Dx ok, SVG-RCA occluded, LIMA-LAD atretic, proximal LAD occluded. EF 20% with anterior apical HK=> salvage PCI with POBA and thrombectomy of the occluded LAD;  c. 01/2017: atretic LIMA_LAD and known occluded SVG-RCA. Patent LAD stent and 70% proxRCA stenosis (FFR 0.88).   . Chronic systolic heart failure ([place])    a. Echo 12/13: EF 20-25%, anteroseptal, inferior, apical HK, mildly reduced RV function, trivial pericardial effusion;   b.  Echo 3/14:  Ant, septal, apical AK, EF 25%  . Clotting disorder ([place])   . Depression    "years ago" (02/28/2018)  .  History of kidney stones   . HLD (hyperlipidemia)   . Hypertension    "I've never had high blood  pressure; on RX for other reasons" (02/28/2018)  . Inguinal hernia    "have one now on my left side" (09/13/2012); (02/28/2018)  . Ischemic cardiomyopathy   . Myocardial infarction [facility]) 1996;  05/2012  . Osteoporosis   . Paroxysmal atrial fibrillation ([place]) 03/17/2016   noted on device interrogation,  will follow burden  . Pectus excavatum   . Pulmonary embolism ([place])    a. after adhesiolysis for SBO in 04/2012 => coumadin for 6 mos  . Renal cyst   . SBO (small bowel obstruction) [facility]) November 2013   s/p lysis of adhesions  . Sinoatrial node dysfunction (HCC)   . Skin cancer    "left ear; face" (02/28/2018)  . Ventricular fibrillation ([place]) 05/2012   . in setting of AL STEMI 12/13 => EF 20-25% => d/c on LifeVest (repeat echo planned in 08/2012)    SOCIAL HX:  Social History   Tobacco Use  . Smoking status: Former Smoker    Types: Cigarettes  . Smokeless tobacco: Never Used  . Tobacco comment: 09/14/2011; 02/28/2018  "quit smoking when I was ~ 17; probably didn't smoke 10 packs in my life"  Substance Use Topics  . Alcohol use: Never    ALLERGIES: No Known Allergies   PERTINENT MEDICATIONS:  Outpatient Encounter Medications as of 12/18/2019  Medication Sig  . cholecalciferol (VITAMIN D) 1000 units tablet Take 1,000 Units by mouth daily.  . cyanocobalamin 1000 MCG tablet Take 2,000 mcg by mouth daily.   Marland Kitchen docusate sodium (COLACE) 100 MG capsule Take 100 mg by mouth 2 (two) times daily.   Marland Kitchen ELIQUIS 2.5 MG TABS tablet TAKE 1 TABLET BY MOUTH TWICE A DAY  . feeding supplement, ENSURE ENLIVE, (ENSURE ENLIVE) LIQD Take 237 mLs by mouth 3 (three) times daily between meals.  . finasteride (PROSCAR) 5 MG tablet Take 5 mg by mouth daily.  . furosemide (LASIX) 20 MG tablet Take 1 tablet (20 mg total) by mouth daily.  . Multiple Vitamins-Minerals (ICAPS AREDS 2 PO) Take 1 tablet by mouth 2 (two) times daily.   . nitroGLYCERIN (NITROSTAT) 0.4 MG SL tablet Place 1 tablet (0.4 mg total) under the  tongue every 5 (five) minutes x 3 doses as needed for chest pain. Take only as needed for chest pain if your blood pressure is greater than 100/80.  Marland Kitchen sulfamethoxazole-trimethoprim (BACTRIM DS) 800-160 MG tablet Take 1 tablet by mouth 2 (two) times daily.   No facility-administered encounter medications on file as of 12/18/2019.      Teodoro Spray, NP

## 2019-12-19 ENCOUNTER — Ambulatory Visit (INDEPENDENT_AMBULATORY_CARE_PROVIDER_SITE_OTHER): Payer: Medicare Other | Admitting: Emergency Medicine

## 2019-12-19 ENCOUNTER — Other Ambulatory Visit: Payer: Self-pay

## 2019-12-19 DIAGNOSIS — I469 Cardiac arrest, cause unspecified: Secondary | ICD-10-CM

## 2019-12-19 LAB — CUP PACEART INCLINIC DEVICE CHECK
Battery Remaining Longevity: 33 mo
Brady Statistic RA Percent Paced: 17 %
Brady Statistic RV Percent Paced: 2.4 %
Date Time Interrogation Session: 20210713102647
HighPow Impedance: 58.5 Ohm
Implantable Lead Implant Date: 20140408
Implantable Lead Implant Date: 20140408
Implantable Lead Location: 753859
Implantable Lead Location: 753860
Implantable Pulse Generator Implant Date: 20140408
Lead Channel Impedance Value: 350 Ohm
Lead Channel Impedance Value: 350 Ohm
Lead Channel Pacing Threshold Amplitude: 0.5 V
Lead Channel Pacing Threshold Amplitude: 0.75 V
Lead Channel Pacing Threshold Pulse Width: 0.5 ms
Lead Channel Pacing Threshold Pulse Width: 0.5 ms
Lead Channel Sensing Intrinsic Amplitude: 11.6 mV
Lead Channel Sensing Intrinsic Amplitude: 3.2 mV
Lead Channel Setting Pacing Amplitude: 2 V
Lead Channel Setting Pacing Amplitude: 2.5 V
Lead Channel Setting Pacing Pulse Width: 0.5 ms
Lead Channel Setting Sensing Sensitivity: 0.5 mV
Pulse Gen Serial Number: 7094248

## 2019-12-19 NOTE — Progress Notes (Signed)
ICD check in clinic. Normal device function. Thresholds and sensing consistent with previous device measurements. Impedance trends stable over time. AT/AF burden <1%, + Eliquis.  RA noise noted, hx of same. 1 NSVT, 10 beats, lasted 4 seconds. Histogram distribution appropriate for patient and level of activity. No changes made this session. Device programmed at appropriate safety margins. Device programmed to optimize intrinsic conduction. Estimated longevity 2.3-2.8 years. Next in-clinic device check scheduled for 03/19/20. Patient education completed including shock plan. ROV w/ A. Tillery, [name] in 3 months.

## 2019-12-25 ENCOUNTER — Ambulatory Visit (HOSPITAL_COMMUNITY)
Admission: RE | Admit: 2019-12-25 | Discharge: 2019-12-25 | Disposition: A | Discharge status: Home / Self Care | Payer: Medicare Other | Source: Ambulatory Visit | Attending: Internal Medicine | Admitting: Internal Medicine

## 2019-12-25 ENCOUNTER — Encounter (HOSPITAL_COMMUNITY): Payer: Self-pay | Admitting: Internal Medicine

## 2019-12-25 ENCOUNTER — Other Ambulatory Visit: Payer: Self-pay

## 2019-12-25 VITALS — BP 136/88 | HR 86 | Wt 134.0 lb

## 2019-12-25 DIAGNOSIS — Z8674 Personal history of sudden cardiac arrest: Secondary | ICD-10-CM | POA: Insufficient documentation

## 2019-12-25 DIAGNOSIS — I480 Paroxysmal atrial fibrillation: Secondary | ICD-10-CM | POA: Diagnosis not present

## 2019-12-25 DIAGNOSIS — I25810 Atherosclerosis of coronary artery bypass graft(s) without angina pectoris: Secondary | ICD-10-CM | POA: Insufficient documentation

## 2019-12-25 DIAGNOSIS — Z7901 Long term (current) use of anticoagulants: Secondary | ICD-10-CM | POA: Diagnosis not present

## 2019-12-25 DIAGNOSIS — Z8249 Family history of ischemic heart disease and other diseases of the circulatory system: Secondary | ICD-10-CM | POA: Insufficient documentation

## 2019-12-25 DIAGNOSIS — Z79899 Other long term (current) drug therapy: Secondary | ICD-10-CM | POA: Diagnosis not present

## 2019-12-25 DIAGNOSIS — I255 Ischemic cardiomyopathy: Secondary | ICD-10-CM | POA: Diagnosis not present

## 2019-12-25 DIAGNOSIS — Z86711 Personal history of pulmonary embolism: Secondary | ICD-10-CM | POA: Diagnosis not present

## 2019-12-25 DIAGNOSIS — I2510 Atherosclerotic heart disease of native coronary artery without angina pectoris: Secondary | ICD-10-CM

## 2019-12-25 DIAGNOSIS — Z87891 Personal history of nicotine dependence: Secondary | ICD-10-CM | POA: Diagnosis not present

## 2019-12-25 DIAGNOSIS — Z95810 Presence of automatic (implantable) cardiac defibrillator: Secondary | ICD-10-CM | POA: Diagnosis not present

## 2019-12-25 DIAGNOSIS — I5042 Chronic combined systolic (congestive) and diastolic (congestive) heart failure: Secondary | ICD-10-CM | POA: Insufficient documentation

## 2019-12-25 DIAGNOSIS — Z955 Presence of coronary angioplasty implant and graft: Secondary | ICD-10-CM | POA: Insufficient documentation

## 2019-12-25 DIAGNOSIS — I110 Hypertensive heart disease with heart failure: Secondary | ICD-10-CM | POA: Insufficient documentation

## 2019-12-25 DIAGNOSIS — I252 Old myocardial infarction: Secondary | ICD-10-CM | POA: Insufficient documentation

## 2019-12-25 DIAGNOSIS — E785 Hyperlipidemia, unspecified: Secondary | ICD-10-CM | POA: Insufficient documentation

## 2019-12-25 DIAGNOSIS — I5022 Chronic systolic (congestive) heart failure: Secondary | ICD-10-CM | POA: Diagnosis not present

## 2019-12-25 DIAGNOSIS — R5383 Other fatigue: Secondary | ICD-10-CM | POA: Diagnosis present

## 2019-12-25 MED ORDER — LOSARTAN POTASSIUM 25 MG PO TABS: 12.5000 mg | ORAL_TABLET | Freq: Every day | ORAL | 3 refills | Status: DC

## 2019-12-25 NOTE — Progress Notes (Signed)
Advanced Heart Failure Clinic Note  Date:  12/25/2019   ID:  Louis Reid, DOB [birth year], MRN [id number]  Location: Home  Provider location: [place] [facility] Type of Visit: Established patient  PCP:  [name], MD  Cardiologist:  No primary care provider on file. Primary HF: Davon Folta  Chief Complaint: Heart Failure follow-up   History of Present Illness:  Louis Reid is a 86 y.o. male with PMH of CAD s/p CABG in 1996 with STEMI in 05/2012 and cath showing patent SVG-OM1-OM2 and SVG-D1 with occluded SVG-RCA and atretic LIMA-LAD with thrombectomy of proximal LAD occlusion, no significant changes on cath in 01/2017), HTN, HLD, ICM (s/p ICD placement in 2014), and chronic combined systolic and diastolic CHF.   Seen in [facility] clinic 01/10/2018 with multiple complaints. This included fatigue and DOE. He had stopped lisinopril due to hypotension.  Repeat echo ordered as below which showed decrease in EF, so referred to HF clinic by Jory Sims for HF evaluation & med optimization.    Admitted from clinic 02/28/18 with concerns for low output HF. RHC as below showed low filling pressures and mildly reduced CO. Given gentle IVF. Palliative consulted as pt refused milrinone. Ultimately, decided for home with hospice with ICD on for now. Started on Eliquis for new Afib.  He continues with palliative care after graduating hospice.  Palliative Care called to let us know pt was c/o increased fatigue and weakness.  Denied any other issues and felt it was from his medications.  He was only on Lasix 20 mg daily and Lisinopril 2.5 mg daily. Lisinopril stopped.    Saw Bunnie Domino in 10/02/19 and was stable. Referred by back to HF Clinic. Also seen by EP in April and ICD stable. EP but unable to follow his device remotely with bad cell phone service in [place].    He presents for routine follow up. Says he feels great at rest but continues with exertional  fatigue. Can do all ADLs without problem but fatigued if he tries to do more. That said he is able to drive to store by himself and go shopping. No edema, orthopnea or PND. No CP. Says BP often runs low at home.    Past Medical History:  Diagnosis Date  . AICD (automatic cardioverter/defibrillator) present 09/13/2012  . Anemia   . CAD (coronary artery disease)    a. s/p remote CABG, b. AL STEMI c/b VF arrest in 05/2012 => LHC 05/10/12: Severe 3v CAD, S-OM1/2 ok, SVG-Dx ok, SVG-RCA occluded, LIMA-LAD atretic, proximal LAD occluded. EF 20% with anterior apical HK=> salvage PCI with POBA and thrombectomy of the occluded LAD;  c. 01/2017: atretic LIMA_LAD and known occluded SVG-RCA. Patent LAD stent and 70% proxRCA stenosis (FFR 0.88).   . Chronic systolic heart failure ([place])    a. Echo 12/13: EF 20-25%, anteroseptal, inferior, apical HK, mildly reduced RV function, trivial pericardial effusion;   b.  Echo 3/14:  Ant, septal, apical AK, EF 25%  . Clotting disorder ([place])   . Depression    "years ago" (02/28/2018)  . History of kidney stones   . HLD (hyperlipidemia)   . Hypertension    "I've never had high blood pressure; on RX for other reasons" (02/28/2018)  . Inguinal hernia    "have one now on my left side" (09/13/2012); (02/28/2018)  . Ischemic cardiomyopathy   . Myocardial infarction [facility]) 1996;  05/2012  . Osteoporosis   . Paroxysmal atrial  fibrillation ([place]) 03/17/2016   noted on device interrogation,  will follow burden  . Pectus excavatum   . Pulmonary embolism ([place])    a. after adhesiolysis for SBO in 04/2012 => coumadin for 6 mos  . Renal cyst   . SBO (small bowel obstruction) [facility]) November 2013   s/p lysis of adhesions  . Sinoatrial node dysfunction (HCC)   . Skin cancer    "left ear; face" (02/28/2018)  . Ventricular fibrillation ([place]) 05/2012   . in setting of AL STEMI 12/13 => EF 20-25% => d/c on LifeVest (repeat echo planned in 08/2012)   Past Surgical History:  Procedure  Laterality Date  . CATARACT EXTRACTION W/ INTRAOCULAR LENS  IMPLANT, BILATERAL Bilateral   . COLONOSCOPY    . CORONARY ANGIOPLASTY WITH STENT PLACEMENT     "I've got 2 or 3 stents" (02/28/2018)  . CORONARY ARTERY BYPASS GRAFT  1996   "CABG X5" (09/13/2012)  . CYSTOSCOPY/RETROGRADE/URETEROSCOPY/STONE EXTRACTION WITH BASKET  1990's  . ESOPHAGOGASTRODUODENOSCOPY  05/08/2012   Procedure: ESOPHAGOGASTRODUODENOSCOPY (EGD);  Surgeon: Juanita Craver, MD;  Location: [facility] ENDOSCOPY;  Service: Endoscopy;  Laterality: N/A;  Nevin Bloodgood put on for Dr. Collene Mares , Dr. Collene Mares will call if she wants another time  . IMPLANTABLE CARDIOVERTER DEFIBRILLATOR IMPLANT N/A 09/13/2012   SJM Ellipse ER implanted by Dr Rayann Heman for secondary prevention  . INGUINAL HERNIA REPAIR Right 1996  . INTRAVASCULAR PRESSURE WIRE/FFR STUDY N/A 01/20/2017   Procedure: INTRAVASCULAR PRESSURE WIRE/FFR STUDY;  Surgeon: Wellington Hampshire, MD;  Location: [place] CV LAB;  Service: Cardiovascular;  Laterality: N/A;  . LAPAROTOMY  04/27/2012   Procedure: EXPLORATORY LAPAROTOMY;  Surgeon: Gwenyth Ober, MD;  Location: [place];  Service: General;  Laterality: N/A;  . LEFT HEART CATH AND CORS/GRAFTS ANGIOGRAPHY N/A 01/20/2017   Procedure: LEFT HEART CATH AND CORS/GRAFTS ANGIOGRAPHY;  Surgeon: Wellington Hampshire, MD;  Location: [place] CV LAB;  Service: Cardiovascular;  Laterality: N/A;  . LEFT HEART CATHETERIZATION WITH CORONARY ANGIOGRAM N/A 05/10/2012   Procedure: LEFT HEART CATHETERIZATION WITH CORONARY ANGIOGRAM;  Surgeon: Burnell Blanks, MD;  Location: [facility] CATH LAB;  Service: Cardiovascular;  Laterality: N/A;  . PTCA     percutaneous transluminal coronary intervention and brachy therapy, Bruce R. Olevia Perches, MD. EF 60%  . RIGHT HEART CATH N/A 03/01/2018   Procedure: RIGHT HEART CATH;  Surgeon: Jolaine Artist, MD;  Location: [place] CV LAB;  Service: Cardiovascular;  Laterality: N/A;  . SKIN CANCER EXCISION     "left ear; face" (02/28/2018)  .  TRANSURETHRAL RESECTION OF PROSTATE       Current Outpatient Medications  Medication Sig Dispense Refill  . cholecalciferol (VITAMIN D) 1000 units tablet Take 1,000 Units by mouth daily.    . cyanocobalamin 1000 MCG tablet Take 2,000 mcg by mouth daily.     Marland Kitchen docusate sodium (COLACE) 100 MG capsule Take 100 mg by mouth 2 (two) times daily.     Marland Kitchen ELIQUIS 2.5 MG TABS tablet TAKE 1 TABLET BY MOUTH TWICE A DAY 60 tablet 5  . feeding supplement, ENSURE ENLIVE, (ENSURE ENLIVE) LIQD Take 237 mLs by mouth 3 (three) times daily between meals. 237 mL 12  . finasteride (PROSCAR) 5 MG tablet Take 5 mg by mouth daily.    . furosemide (LASIX) 20 MG tablet Take 1 tablet (20 mg total) by mouth daily. 90 tablet 0  . Multiple Vitamins-Minerals (ICAPS AREDS 2 PO) Take 1 tablet by mouth 2 (two) times daily.     Marland Kitchen  nitroGLYCERIN (NITROSTAT) 0.4 MG SL tablet Place 1 tablet (0.4 mg total) under the tongue every 5 (five) minutes x 3 doses as needed for chest pain. Take only as needed for chest pain if your blood pressure is greater than 100/80. 25 tablet 12  . sulfamethoxazole-trimethoprim (BACTRIM DS) 800-160 MG tablet Take 1 tablet by mouth 2 (two) times daily. (Patient not taking: Reported on 12/25/2019)     No current facility-administered medications for this encounter.    Allergies:   Patient has no known allergies.   Social History:  The patient  reports that he has quit smoking. His smoking use included cigarettes. He has never used smokeless tobacco. He reports that he does not drink alcohol and does not use drugs.   Family History:  The patient's family history includes Cancer in his brother; Colon cancer in his brother; Heart disease in his brother and father; Other in his mother.   ROS:  Please see the history of present illness.   All other systems are personally reviewed and negative.  Vitals:   12/25/19 0942  BP: 136/88  Pulse: 86  SpO2: 96%  Weight: 60.8 kg (134 lb)      Exam:   General:   Elderly. Weak  appearing. No resp difficulty HEENT: normal Neck: supple. no JVD. Carotids 2+ bilat; no bruits. No lymphadenopathy or thryomegaly appreciated. Cor: PMI nondisplaced. Irregular rate & rhythm. No rubs, gallops or murmurs. Lungs: clear Abdomen: soft, nontender, nondistended. No hepatosplenomegaly. No bruits or masses. Good bowel sounds. Extremities: no cyanosis, clubbing, rash, edema Neuro: alert & orientedx3, cranial nerves grossly intact. moves all 4 extremities w/o difficulty. Affect pleasant   Recent Labs: No results found for requested labs within last 8760 hours.  Personally reviewed   Wt Readings from Last 3 Encounters:  12/25/19 60.8 kg (134 lb)  10/02/19 62.8 kg (138 lb 6.4 oz)  09/19/19 65.9 kg (145 lb 3.2 oz)      ASSESSMENT AND PLAN:  1. Chronic systolic CHF, ICM - Echo 02/09/99 LVEF 15%, Grade 1 DD, Trivial MR, Mildly reduced RV function, PA peak pressure 32 mm Hg.  - s/p St. Jude ICD. He wants to keep tachytherapies on for now. - EF dropped from 20-25% ->15% from 01/2017 -> 2019.  - RHC 03/01/18 with low filling pressures and mildly reduced CO. - He is doing better than I expected.  Stable NYHA III symptoms - Volume status ok  - He is not a candidate for VAD or transplant. - Continue lasix 20 mg daily for now.  - No BB with for low output.  - Digoxin stopped due to high level in 2019 - Lisinopril stopped previously due to low BP and fatigue. Will re-challenge with losartan 12.5 mg qhs to see if he can tolerate - Repeat echo and labs in 2weeks  - Followed by [facility] > now graduated to palliative.  - ICD interrogated in clinic. Stable function.   2. CAD s/p CABG - Most recent cath 01/2017 with patent SVG to diagonal, patent SVG to OM 2, and OM - Patent proximal LAD stent. LIMA to LAD and SVG to RCA known occluded.  - No s/s of ischemia  - Continue statin.  - BB stopped with concern for low output.No ASA now that he is on Eliquis.   3.  PAF - <1% burden by ICD interrogation.  - Continue Eliquis 2.5 bid. No bleeding   4. HLD - Continue statin. No change.     Signed,  Glori Bickers, MD  12/25/2019 10:03 AM  Advanced Heart Failure [place] [address] [place] [id number] [phone] (office) [phone] (fax)

## 2019-12-25 NOTE — Patient Instructions (Signed)
No labs done today.   START Losartan 12.5mg (1/2 tablet) by mouth daily at bedtime.  No other medication changes were made. Please continue all other medications as prescribed.  Your physician recommends that you schedule a follow-up appointment in: 2 weeks for an echo and blood work and in 9 months for an appointment with Dr. Haroldine Laws. Our office will contact you at a later date to schedule an appointment with Dr. Haroldine Laws.   If you have any questions or concerns before your next appointment please send [place] a message through [place] or call our office at [phone].    TO LEAVE A MESSAGE FOR THE NURSE SELECT OPTION 2, PLEASE LEAVE A MESSAGE INCLUDING: . YOUR NAME . DATE OF BIRTH . CALL BACK NUMBER . REASON FOR CALL**this is important as we prioritize the call backs  [place] AS LONG AS YOU CALL BEFORE 4:00 PM   Your physician has requested that you have an echocardiogram. Echocardiography is a painless test that uses sound waves to create images of your heart. It provides your doctor with information about the size and shape of your heart and how well your heart's chambers and valves are working. This procedure takes approximately one hour. There are no restrictions for this procedure.   At the [facility], you and your health needs are our priority. As part of our continuing mission to provide you with exceptional heart care, we have created designated Provider Care Teams. These Care Teams include your primary Cardiologist (physician) and Advanced Practice Providers (APPs- Physician Assistants and Nurse Practitioners) who all work together to provide you with the care you need, when you need it.   You may see any of the following providers on your designated Care Team at your next follow up: Marland Kitchen Dr Glori Bickers . Dr Loralie Champagne . Darrick Grinder, NP . Lyda Jester, PA . Audry Riles, PharmD   Please be sure to bring in all  your medications bottles to every appointment.

## 2020-01-08 ENCOUNTER — Ambulatory Visit (HOSPITAL_COMMUNITY)
Admission: RE | Admit: 2020-01-08 | Discharge: 2020-01-08 | Disposition: A | Discharge status: Home / Self Care | Payer: Medicare Other | Source: Ambulatory Visit | Attending: Cardiology | Admitting: Cardiology

## 2020-01-08 ENCOUNTER — Other Ambulatory Visit: Payer: Self-pay

## 2020-01-08 DIAGNOSIS — I110 Hypertensive heart disease with heart failure: Secondary | ICD-10-CM | POA: Insufficient documentation

## 2020-01-08 DIAGNOSIS — Z951 Presence of aortocoronary bypass graft: Secondary | ICD-10-CM | POA: Insufficient documentation

## 2020-01-08 DIAGNOSIS — I2510 Atherosclerotic heart disease of native coronary artery without angina pectoris: Secondary | ICD-10-CM | POA: Insufficient documentation

## 2020-01-08 DIAGNOSIS — I252 Old myocardial infarction: Secondary | ICD-10-CM | POA: Insufficient documentation

## 2020-01-08 DIAGNOSIS — E785 Hyperlipidemia, unspecified: Secondary | ICD-10-CM | POA: Diagnosis not present

## 2020-01-08 DIAGNOSIS — I5022 Chronic systolic (congestive) heart failure: Secondary | ICD-10-CM | POA: Diagnosis not present

## 2020-01-08 LAB — BASIC METABOLIC PANEL
Anion gap: 7 (ref 5–15)
BUN: 28 mg/dL — ABNORMAL HIGH (ref 8–23)
CO2: 25 mmol/L (ref 22–32)
Calcium: 9.1 mg/dL (ref 8.9–10.3)
Chloride: 110 mmol/L (ref 98–111)
Creatinine, Ser: 1.44 mg/dL — ABNORMAL HIGH (ref 0.61–1.24)
GFR calc Af Amer: 51 mL/min — ABNORMAL LOW (ref >60)
GFR calc non Af Amer: 44 mL/min — ABNORMAL LOW (ref >60)
Glucose, Bld: 85 mg/dL (ref 70–99)
Potassium: 4.8 mmol/L (ref 3.5–5.1)
Sodium: 142 mmol/L (ref 135–145)

## 2020-01-08 LAB — CBC
HCT: 42 % (ref 39.0–52.0)
Hemoglobin: 13.2 g/dL (ref 13.0–17.0)
MCH: 31.9 pg (ref 26.0–34.0)
MCHC: 31.4 g/dL (ref 30.0–36.0)
MCV: 101.4 fL — ABNORMAL HIGH (ref 80.0–100.0)
Platelets: 130 10*3/uL — ABNORMAL LOW (ref 150–400)
RBC: 4.14 MIL/uL — ABNORMAL LOW (ref 4.22–5.81)
RDW: 14.4 % (ref 11.5–15.5)
WBC: 5 10*3/uL (ref 4.0–10.5)
nRBC: 0 % (ref 0.0–0.2)

## 2020-01-08 LAB — ECHOCARDIOGRAM COMPLETE: S' Lateral: 4.8 cm

## 2020-01-08 LAB — BRAIN NATRIURETIC PEPTIDE: B Natriuretic Peptide: 305.4 pg/mL — ABNORMAL HIGH (ref 0.0–100.0)

## 2020-01-08 NOTE — Progress Notes (Signed)
  Echocardiogram 2D Echocardiogram has been performed.  Jennette Dubin 01/08/2020, 11:57 AM

## 2020-01-17 ENCOUNTER — Other Ambulatory Visit: Payer: Self-pay | Admitting: Cardiology

## 2020-01-19 ENCOUNTER — Other Ambulatory Visit: Payer: Medicare Other | Admitting: Hospice

## 2020-01-19 ENCOUNTER — Other Ambulatory Visit: Payer: Self-pay

## 2020-01-19 DIAGNOSIS — Z515 Encounter for palliative care: Secondary | ICD-10-CM

## 2020-01-19 NOTE — Progress Notes (Signed)
[facility] Consult Note Telephone: [phone]  Fax: [phone]  PATIENT NAME: Louis Reid DOB: [birth year] MRN: [id number]  PRIMARY CARE PROVIDER:Elkins, [name], MD  REFERRING PROVIDER:Elkins, [name], MD  RESPONSIBLE PARTY:[phone]  TELEHEALTH VISIT STATEMENT Due to the COVID-19 crisis, this visit was done via telephone from my office. It was initiated and consented to by this patient and/or family.   RECOMMENDATIONS/PLAN:  Advance Care Planning/Goals of Care: Patient remainsaFull code withGoals of care includingto maximize quality of life and symptom management. MOST selectionsinclude full scope of treatment and feeding tube for a defined period of time. Follow CN:[id number] care will continue to follow patient for goals of care clarification and symptom management.In person visit requested for 01/22/2020. Symptom management:01/19/2020: Patient reported injury to left hand while working on /repairing  his vehicle.  Left hand slightly swollen.  No bleeding, no headaches or syncope.  NP advised he goes to urgent care or ER for x-ray to rule out fracture.  Patient refused stating that Covid 19 is resurging.  He said he is able to use his left hand and does not think there is a fracture.  NP encouraged ice compression 20 to 30 minutes 4 times a day for the next 48 hours as well as elevation; patient can take over-the-counter Tylenol 500 mg 3 times daily as needed for pain.  He verbalized understanding and knows to call with worsening symptoms.  In person  follow-up visit scheduled for 01/22/2020. 12/18/2019: Patient denies pain and continues to sleep good; appetite is fair and he continues to eat small meals 4-5 times daily as needed.  He is continuing with 10mg  lasix daily, with no cough, no SOB; No chest pain or need to use Nitroglycerine or pain medication..On going fatigue r/t CHF at baseline;  patient still independent in his daily activities; continuing to pace and rest between activities.  He reports he gets short of breath when he goes to get his mails from his; he is requiring more rest in between activities than before.  He has appointment with PCP next week for comprehensive lab work.  He plans to request physical therapy from PCP for strengthening.  He denies edema to bilateral lower extremities and no difficulty breathing during visit.   Patient with history of blood in urine-recent PET scan and cystoscopy were negative;ICD device checked at Cardiologist office 06/15/2019 - normal device function. Patient denies palpitations, chest pain/discomfort; continues on Eliquis as ordered.He continues onlifestyle modifications such as exercise as tolerated, reduced salt intake. He reports taking 2 shots of COVID-19 vaccination.  Patient continues on Covid precautions-staying away from large crowds of individuals, using mask as needed, hand hygiene etc.No acute concerns or complaints. He is compliant with his medications and keeps appointments with his medical team.Encouraged ongoing care.  I spent35 minutesproviding this consultation; time includes chart review and documentation.More than 50% of the time in this consultation was spent on coordinating communication  HISTORY OF PRESENT ILLNESS:Louis R Jonesis a 86 y.o.year oldmalewith multiple medical problems including CAD, cardiomyopathy, unstable angina, HFrEF, edema, chronic systolic heart failure.Palliative Care was asked to help address goals of care  CODE STATUS:Full  PPS:60% HOSPICE ELIGIBILITY/DIAGNOSIS: TBD  PAST MEDICAL HISTORY:  Past Medical History:  Diagnosis Date  . AICD (automatic cardioverter/defibrillator) present 09/13/2012  . Anemia   . CAD (coronary artery disease)    a. s/p remote CABG, b. AL STEMI c/b VF arrest in 05/2012 => LHC 05/10/12: Severe 3v CAD,  S-OM1/2 ok, SVG-Dx ok, SVG-RCA occluded,  LIMA-LAD atretic, proximal LAD occluded. EF 20% with anterior apical HK=> salvage PCI with POBA and thrombectomy of the occluded LAD;  c. 01/2017: atretic LIMA_LAD and known occluded SVG-RCA. Patent LAD stent and 70% proxRCA stenosis (FFR 0.88).   . Chronic systolic heart failure ([place])    a. Echo 12/13: EF 20-25%, anteroseptal, inferior, apical HK, mildly reduced RV function, trivial pericardial effusion;   b.  Echo 3/14:  Ant, septal, apical AK, EF 25%  . Clotting disorder ([place])   . Depression    "years ago" (02/28/2018)  . History of kidney stones   . HLD (hyperlipidemia)   . Hypertension    "I've never had high blood pressure; on RX for other reasons" (02/28/2018)  . Inguinal hernia    "have one now on my left side" (09/13/2012); (02/28/2018)  . Ischemic cardiomyopathy   . Myocardial infarction [facility]) 1996;  05/2012  . Osteoporosis   . Paroxysmal atrial fibrillation ([place]) 03/17/2016   noted on device interrogation,  will follow burden  . Pectus excavatum   . Pulmonary embolism ([place])    a. after adhesiolysis for SBO in 04/2012 => coumadin for 6 mos  . Renal cyst   . SBO (small bowel obstruction) [facility]) November 2013   s/p lysis of adhesions  . Sinoatrial node dysfunction (HCC)   . Skin cancer    "left ear; face" (02/28/2018)  . Ventricular fibrillation ([place]) 05/2012   . in setting of AL STEMI 12/13 => EF 20-25% => d/c on LifeVest (repeat echo planned in 08/2012)    SOCIAL HX:  Social History   Tobacco Use  . Smoking status: Former Smoker    Types: Cigarettes  . Smokeless tobacco: Never Used  . Tobacco comment: 09/14/2011; 02/28/2018  "quit smoking when I was ~ 17; probably didn't smoke 10 packs in my life"  Substance Use Topics  . Alcohol use: Never    ALLERGIES: No Known Allergies   PERTINENT MEDICATIONS:  Outpatient Encounter Medications as of 01/19/2020  Medication Sig  . cholecalciferol (VITAMIN D) 1000 units tablet Take 1,000 Units by mouth daily.  . cyanocobalamin 1000 MCG  tablet Take 2,000 mcg by mouth daily.   Marland Kitchen docusate sodium (COLACE) 100 MG capsule Take 100 mg by mouth 2 (two) times daily.   Marland Kitchen ELIQUIS 2.5 MG TABS tablet TAKE 1 TABLET BY MOUTH TWICE A DAY  . feeding supplement, ENSURE ENLIVE, (ENSURE ENLIVE) LIQD Take 237 mLs by mouth 3 (three) times daily between meals.  . finasteride (PROSCAR) 5 MG tablet Take 5 mg by mouth daily.  . furosemide (LASIX) 20 MG tablet TAKE 1 TABLET BY MOUTH EVERY DAY  . losartan (COZAAR) 25 MG tablet Take 0.5 tablets (12.5 mg total) by mouth at bedtime.  . Multiple Vitamins-Minerals (ICAPS AREDS 2 PO) Take 1 tablet by mouth 2 (two) times daily.   . nitroGLYCERIN (NITROSTAT) 0.4 MG SL tablet Place 1 tablet (0.4 mg total) under the tongue every 5 (five) minutes x 3 doses as needed for chest pain. Take only as needed for chest pain if your blood pressure is greater than 100/80.  Marland Kitchen sulfamethoxazole-trimethoprim (BACTRIM DS) 800-160 MG tablet Take 1 tablet by mouth 2 (two) times daily. (Patient not taking: Reported on 12/25/2019)   No facility-administered encounter medications on file as of 01/19/2020.    Teodoro Spray, NP

## 2020-01-22 ENCOUNTER — Other Ambulatory Visit: Payer: Self-pay

## 2020-01-22 ENCOUNTER — Other Ambulatory Visit: Payer: Medicare Other | Admitting: Hospice

## 2020-01-22 DIAGNOSIS — I5022 Chronic systolic (congestive) heart failure: Secondary | ICD-10-CM

## 2020-01-22 DIAGNOSIS — Z515 Encounter for palliative care: Secondary | ICD-10-CM

## 2020-01-22 NOTE — Progress Notes (Signed)
Louis Reid Consult Note Telephone: [phone]  Fax: [phone] PATIENT NAME: Louis Reid DOB: March 22, 1933 MRN: [id number]  PRIMARY CARE PROVIDER:Elkins, [name], MD  REFERRING PROVIDER:Elkins, [name], MD  RESPONSIBLE PARTY:[phone]     RECOMMENDATIONS/PLAN:  Advance Care Planning/Goals of Care: Patient per patient's request for to follow up on patient's health status and to build trust. Full code: Patientaffirmed he is aFull code. Goals of care: Goals of care include to maximize quality of life and symptom management.MOSTselectionsinclude full scope of treatment and feeding tube for a defined period of time. Visit also consisted of counseling and education dealing with the complex and emotionally intense issues of symptom management and palliative care in the setting of serious and potentially life-threatening illness.  Patient shared his son is on plan and time for COVID-19.  Therapeutic listening and ample emotional support provided.  Palliative care team will continue to support patient, patient's family, and medical team. Follow WL:[id number] care will continue to follow patient for goals of care clarification and symptom management. Follow-up scheduled in 2 months 03/18/2020. Symptom management:01/22/2020: Patient reported he does not have pain whatsoever; he is using bilateral hands but edema persists with some improvement. NP called Dr Alanson Puls office and spoke with Ferd Hibbs NP updating her on patient's status. Appointment for patient at PCP office secured for tomorrow 01/23/2020 to further eval hand and draw routine labs. Patient advised to switch to warm compress and elevation to left hand. He verbalized understanding.  Patient difficulty breathing, shortness of breath.  He is compliant with his medications; no edema to bilateral lower extremities. 01/19/2020: Patient reported  injury to left hand while working on /repairing  his vehicle.  Left hand slightly swollen.  No bleeding, no headaches or syncope.  NP advised he goes to urgent care or ER for x-ray to rule out fracture.  Patient refused stating that Covid 19 is resurging.  He said he is able to use his left hand and does not think there is a fracture.  NP encouraged ice compression 20 to 30 minutes 4 times a day for the next 48 hours as well as elevation; patient can take over-the-counter Tylenol 500 mg 3 times daily as needed for pain.  He verbalized understanding and knows to call with worsening symptoms.  In person  follow-up visit scheduled for 01/22/2020. Patient with history of blood in urine-recent PET scan and cystoscopy were negative;ICD device checked at Cardiologist office 06/15/2019 - normal device function. Patient denies palpitations, chest pain/discomfort; continues on Eliquis as ordered.He continues onlifestyle modifications such as exercise as tolerated, reduced salt intake. He reports taking 2 shots of COVID-19 vaccination.Patient continues on Covid precautions-staying away from large crowds of individuals, using mask as needed, hand hygiene etc.No acute concerns or complaints.He is compliant with his medications and keeps appointments with his medical team.Encouraged ongoing care.  I spent68minutesproviding this consultation; time includes time spent with patient, provider coordination, chart review and documentation.More than 50% of the time in this consultation was spent on coordinating communication  HISTORY OF PRESENT ILLNESS:Louis R Jonesis a 86 y.o.year oldmalewith multiple medical problems including CAD, cardiomyopathy, unstable angina, HFrEF, edema, chronic systolic heart failure.Palliative Care was asked to help address goals of care  CODE STATUS:Full  PPS:60% HOSPICE ELIGIBILITY/DIAGNOSIS: TBD  PAST MEDICAL HISTORY:  Past Medical History:  Diagnosis Date  . AICD  (automatic cardioverter/defibrillator) present 09/13/2012  . Anemia   . CAD (coronary artery disease)    a.  s/p remote CABG, b. AL STEMI c/b VF arrest in 05/2012 => LHC 05/10/12: Severe 3v CAD, S-OM1/2 ok, SVG-Dx ok, SVG-RCA occluded, LIMA-LAD atretic, proximal LAD occluded. EF 20% with anterior apical HK=> salvage PCI with POBA and thrombectomy of the occluded LAD;  c. 01/2017: atretic LIMA_LAD and known occluded SVG-RCA. Patent LAD stent and 70% proxRCA stenosis (FFR 0.88).   . Chronic systolic heart failure ([place])    a. Echo 12/13: EF 20-25%, anteroseptal, inferior, apical HK, mildly reduced RV function, trivial pericardial effusion;   b.  Echo 3/14:  Ant, septal, apical AK, EF 25%  . Clotting disorder ([place])   . Depression    "years ago" (02/28/2018)  . History of kidney stones   . HLD (hyperlipidemia)   . Hypertension    "I've never had high blood pressure; on RX for other reasons" (02/28/2018)  . Inguinal hernia    "have one now on my left side" (09/13/2012); (02/28/2018)  . Ischemic cardiomyopathy   . Myocardial infarction [facility]) 1996;  05/2012  . Osteoporosis   . Paroxysmal atrial fibrillation ([place]) 03/17/2016   noted on device interrogation,  will follow burden  . Pectus excavatum   . Pulmonary embolism ([place])    a. after adhesiolysis for SBO in 04/2012 => coumadin for 6 mos  . Renal cyst   . SBO (small bowel obstruction) [place]) November 2013   s/p lysis of adhesions  . Sinoatrial node dysfunction (HCC)   . Skin cancer    "left ear; face" (02/28/2018)  . Ventricular fibrillation ([place]) 05/2012   . in setting of AL STEMI 12/13 => EF 20-25% => d/c on LifeVest (repeat echo planned in 08/2012)    SOCIAL HX:  Social History   Tobacco Use  . Smoking status: Former Smoker    Types: Cigarettes  . Smokeless tobacco: Never Used  . Tobacco comment: 09/14/2011; 02/28/2018  "quit smoking when I was ~ 17; probably didn't smoke 10 packs in my life"  Substance Use Topics  . Alcohol use: Never     ALLERGIES: No Known Allergies   PERTINENT MEDICATIONS:  Outpatient Encounter Medications as of 01/22/2020  Medication Sig  . cholecalciferol (VITAMIN D) 1000 units tablet Take 1,000 Units by mouth daily.  . cyanocobalamin 1000 MCG tablet Take 2,000 mcg by mouth daily.   Marland Kitchen docusate sodium (COLACE) 100 MG capsule Take 100 mg by mouth 2 (two) times daily.   Marland Kitchen ELIQUIS 2.5 MG TABS tablet TAKE 1 TABLET BY MOUTH TWICE A DAY  . feeding supplement, ENSURE ENLIVE, (ENSURE ENLIVE) LIQD Take 237 mLs by mouth 3 (three) times daily between meals.  . finasteride (PROSCAR) 5 MG tablet Take 5 mg by mouth daily.  . furosemide (LASIX) 20 MG tablet TAKE 1 TABLET BY MOUTH EVERY DAY  . losartan (COZAAR) 25 MG tablet Take 0.5 tablets (12.5 mg total) by mouth at bedtime.  . Multiple Vitamins-Minerals (ICAPS AREDS 2 PO) Take 1 tablet by mouth 2 (two) times daily.   . nitroGLYCERIN (NITROSTAT) 0.4 MG SL tablet Place 1 tablet (0.4 mg total) under the tongue every 5 (five) minutes x 3 doses as needed for chest pain. Take only as needed for chest pain if your blood pressure is greater than 100/80.  Marland Kitchen sulfamethoxazole-trimethoprim (BACTRIM DS) 800-160 MG tablet Take 1 tablet by mouth 2 (two) times daily. (Patient not taking: Reported on 12/25/2019)   No facility-administered encounter medications on file as of 01/22/2020.    PHYSICAL EXAM/ROS  General: NAD, thin, cooperative Cardiovascular:  regular rate and rhythm; denies chest pain Abdomen: soft, nontender, + bowel sounds; no complaint of constipation Extremities: no edema to bilateral lower extremities; resolving hematoma to left hand Skin: no rashes to exposed skin Neurological: Weakness but otherwise nonfocal  Teodoro Spray, NP

## 2020-02-06 ENCOUNTER — Other Ambulatory Visit (HOSPITAL_COMMUNITY): Payer: Self-pay | Admitting: Internal Medicine

## 2020-03-18 ENCOUNTER — Other Ambulatory Visit: Payer: Self-pay

## 2020-03-18 ENCOUNTER — Other Ambulatory Visit: Payer: Medicare Other | Admitting: Hospice

## 2020-03-18 DIAGNOSIS — Z515 Encounter for palliative care: Secondary | ICD-10-CM

## 2020-03-18 DIAGNOSIS — I5022 Chronic systolic (congestive) heart failure: Secondary | ICD-10-CM

## 2020-03-18 NOTE — Progress Notes (Signed)
[place] Consult Note Telephone: [phone]  Fax: [phone]  PATIENT NAME: Louis Reid DOB: 12/31/1932 MRN: [id number]  PRIMARY CARE PROVIDER:   [name], MD [name], MD [place],  [place] [id number]  REFERRING PROVIDER: [name], MD [name], MD [place],  [place] [address]  RESPONSIBLE PARTY:    TELEHEALTH VISIT STATEMENT Due to the COVID-19 crisis, this visit was done via telephone from my office. It was initiated and consented to by this patient and/or family.   RECOMMENDATIONS/PLAN:   Advance Care Planning: Visit consisted of building trust and discussions on Palliative Medicine as specialized medical care for people living with serious illness, aimed at facilitating better quality of life through symptoms relief, assisting with advance care plan and establishing goals of care.   Code Status: Code status reviewed. Patient remains a FULL code.  Goals of Care: Goals of care include to maximize quality of life and symptom management. Visit also consisted of counseling and education dealing with the complex and emotionally intense issues of symptom management and palliative care in the setting of serious and potentially life-threatening illness.  He shared his son survived COVID-84, and doing well. Therapeutic listening and ample emotional support provided.  Palliative care team will continue to support patient, patient's family, and medical team.  Follow [id number] care will continue to follow patient for goals of care clarification and symptom management. Follow-up scheduled in 2 months 03/18/2020.  Functional status/Symptom management: 03/18/2020- Patient denies pain/discomfort.  Swelling in left hand has resolved; normal use and strength of left hand at this time.   Ongoing generalized body weakness which he verbalized understanding  is related to his heart condition.  He continues exercises as tolerated.  Balance of activity performance and rest discussed. He is compliant with his medications, in no medical acuity, with no concerns. Palliative will continue to monitor for symptom management/decline and make recommendations as needed.    01/22/2020: Patient reported he does not have pain whatsoever; he is using bilateral hands but edema persists with some improvement. NP called Dr Alanson Puls office and spoke with Ferd Hibbs NP updating her on patient's status. Appointment for patient at PCP office secured for tomorrow 01/23/2020 to further eval hand and draw routine labs. Patient advised to switch to warm compress and elevation to left hand. He verbalized understanding.  Patient difficulty breathing, shortness of breath.  He is compliant with his medications; no edema to bilateral lower extremities.  01/19/2020:Patient reported injury to left hand while workingon/repairing his vehicle.Left hand slightly swollen.No bleeding, no headaches or syncope. NP advised he goes to urgent care or ER for x-ray to rule out fracture. Patient refused stating that Covid 19 is resurging.He said he is able to use his left hand and does not think there is a fracture. NP encouraged ice compression 20 to 30 minutes 4 times a day for the next 48 hours as well as elevation; patient can take over-the-counter Tylenol 500 mg 3 times daily as needed for pain. He verbalized understanding and knows to call with worsening symptoms. In person follow-up visit scheduled for 01/22/2020.  Patient with history of blood in urine-recent PET scan and cystoscopy were negative;ICD device checked at Cardiologist office 06/15/2019 - normal device function. Patient denies palpitations, chest pain/discomfort; continues on Eliquis as ordered.He continues onlifestyle modifications such as exercise as tolerated, reduced salt intake. He reports taking 2 shots of COVID-19  vaccination  and recently received his COVID-19 booster shot and Flu shot. Patient continues on Covid precautions-staying away from large crowds of individuals, using mask as needed, hand hygiene etc.No acute concerns or complaints.He is compliant with his medications and keeps appointments with his medical team.Encouraged ongoing care. Palliative will continue to monitor for symptom management/decline and make recommendations as needed.  Family /Caregiver/Community Supports:  I spent 48  minutes providing this consultation; time includes time spent with patient/family, chart review, provider coordination,  and documentation. More than 50% of the time in this consultation was spent on coordinating communication  HISTORY OF PRESENT ILLNESS:Kaj R Jonesis a 86 y.o.year oldmalewith multiple medical problems including CAD, cardiomyopathy, unstable angina, HFrEF, edema, chronic systolic heart failure.Palliative Care was asked to help address goals of care CODE STATUS: Full  PPS: 60%  HOSPICE ELIGIBILITY/DIAGNOSIS: TBD  PAST MEDICAL HISTORY:  Past Medical History:  Diagnosis Date  . AICD (automatic cardioverter/defibrillator) present 09/13/2012  . Anemia   . CAD (coronary artery disease)    a. s/p remote CABG, b. AL STEMI c/b VF arrest in 05/2012 => LHC 05/10/12: Severe 3v CAD, S-OM1/2 ok, SVG-Dx ok, SVG-RCA occluded, LIMA-LAD atretic, proximal LAD occluded. EF 20% with anterior apical HK=> salvage PCI with POBA and thrombectomy of the occluded LAD;  c. 01/2017: atretic LIMA_LAD and known occluded SVG-RCA. Patent LAD stent and 70% proxRCA stenosis (FFR 0.88).   . Chronic systolic heart failure ([place])    a. Echo 12/13: EF 20-25%, anteroseptal, inferior, apical HK, mildly reduced RV function, trivial pericardial effusion;   b.  Echo 3/14:  Ant, septal, apical AK, EF 25%  . Clotting disorder ([place])   . Depression    "years ago" (02/28/2018)  . History of kidney stones   . HLD  (hyperlipidemia)   . Hypertension    "I've never had high blood pressure; on RX for other reasons" (02/28/2018)  . Inguinal hernia    "have one now on my left side" (09/13/2012); (02/28/2018)  . Ischemic cardiomyopathy   . Myocardial infarction [facility]) 1996;  05/2012  . Osteoporosis   . Paroxysmal atrial fibrillation ([place]) 03/17/2016   noted on device interrogation,  will follow burden  . Pectus excavatum   . Pulmonary embolism ([place])    a. after adhesiolysis for SBO in 04/2012 => coumadin for 6 mos  . Renal cyst   . SBO (small bowel obstruction) [facility]) November 2013   s/p lysis of adhesions  . Sinoatrial node dysfunction (HCC)   . Skin cancer    "left ear; face" (02/28/2018)  . Ventricular fibrillation ([place]) 05/2012   . in setting of AL STEMI 12/13 => EF 20-25% => d/c on LifeVest (repeat echo planned in 08/2012)    SOCIAL HX:  Social History   Tobacco Use  . Smoking status: Former Smoker    Types: Cigarettes  . Smokeless tobacco: Never Used  . Tobacco comment: 09/14/2011; 02/28/2018  "quit smoking when I was ~ 17; probably didn't smoke 10 packs in my life"  Substance Use Topics  . Alcohol use: Never    ALLERGIES: No Known Allergies   PERTINENT MEDICATIONS:  Outpatient Encounter Medications as of 03/18/2020  Medication Sig  . cholecalciferol (VITAMIN D) 1000 units tablet Take 1,000 Units by mouth daily.  . cyanocobalamin 1000 MCG tablet Take 2,000 mcg by mouth daily.   Marland Kitchen docusate sodium (COLACE) 100 MG capsule Take 100 mg by mouth 2 (two) times daily.   Marland Kitchen ELIQUIS 2.5 MG TABS tablet TAKE 1 TABLET BY  MOUTH TWICE A DAY  . feeding supplement, ENSURE ENLIVE, (ENSURE ENLIVE) LIQD Take 237 mLs by mouth 3 (three) times daily between meals.  . finasteride (PROSCAR) 5 MG tablet Take 5 mg by mouth daily.  . furosemide (LASIX) 20 MG tablet TAKE 1 TABLET BY MOUTH EVERY DAY  . losartan (COZAAR) 25 MG tablet Take 0.5 tablets (12.5 mg total) by mouth at bedtime.  . Multiple Vitamins-Minerals (ICAPS  AREDS 2 PO) Take 1 tablet by mouth 2 (two) times daily.   . nitroGLYCERIN (NITROSTAT) 0.4 MG SL tablet Place 1 tablet (0.4 mg total) under the tongue every 5 (five) minutes x 3 doses as needed for chest pain. Take only as needed for chest pain if your blood pressure is greater than 100/80.  Marland Kitchen sulfamethoxazole-trimethoprim (BACTRIM DS) 800-160 MG tablet Take 1 tablet by mouth 2 (two) times daily. (Patient not taking: Reported on 12/25/2019)   No facility-administered encounter medications on file as of 03/18/2020.    Teodoro Spray, NP

## 2020-03-19 ENCOUNTER — Other Ambulatory Visit: Payer: Self-pay

## 2020-03-19 ENCOUNTER — Ambulatory Visit: Payer: Medicare Other | Admitting: Emergency Medicine

## 2020-03-19 DIAGNOSIS — I255 Ischemic cardiomyopathy: Secondary | ICD-10-CM

## 2020-03-19 LAB — CUP PACEART INCLINIC DEVICE CHECK
Battery Remaining Longevity: 32 mo
Brady Statistic RA Percent Paced: 18 %
Brady Statistic RV Percent Paced: 2.8 %
Date Time Interrogation Session: 20211012104721
HighPow Impedance: 59.625
Implantable Lead Implant Date: 20140408
Implantable Lead Implant Date: 20140408
Implantable Lead Location: 753859
Implantable Lead Location: 753860
Implantable Pulse Generator Implant Date: 20140408
Lead Channel Impedance Value: 350 Ohm
Lead Channel Impedance Value: 362.5 Ohm
Lead Channel Pacing Threshold Amplitude: 0.5 V
Lead Channel Pacing Threshold Amplitude: 0.75 V
Lead Channel Pacing Threshold Pulse Width: 0.5 ms
Lead Channel Pacing Threshold Pulse Width: 0.5 ms
Lead Channel Sensing Intrinsic Amplitude: 11.6 mV
Lead Channel Sensing Intrinsic Amplitude: 4.2 mV
Lead Channel Setting Pacing Amplitude: 2 V
Lead Channel Setting Pacing Amplitude: 2.5 V
Lead Channel Setting Pacing Pulse Width: 0.5 ms
Lead Channel Setting Sensing Sensitivity: 0.5 mV
Pulse Gen Serial Number: 7094248

## 2020-03-19 NOTE — Patient Instructions (Signed)
Please call the device clinic with any questions or concerns at [phone].

## 2020-03-19 NOTE — Progress Notes (Signed)
ICD check in clinic. Thresholds and sensing consistent with previous device measurements. Impedance trends stable over time. RA lead positive for noise during isometrics. Impedance did not change.  AT/AF burden <1%, 1 AF episode with controlled VR, lasted 14 seconds, + Eliquis. 14 AMs w/ EGM available, appear noise in atrial channel. 1 episode of NS VT, total of 8 seconds. Histogram distribution appropriate for patient and level of activity. Device programmed at appropriate safety margins. Device programmed to optimize intrinsic conduction. Estimated longevity 2.2-2.7 years. Pts next in-clinic check 06/18/2020. Patient education completed.   Changes made to session: A. Sensitivity programmed 0.7 mV Atrial AutoSense turned Off Atrial Tachycardia Detection Rate programmed to 170 bpm.  Consulted with Dyanne Iha. Jude Rep.  Routing to Dr. Rayann Heman for review.

## 2020-03-20 ENCOUNTER — Telehealth: Payer: Self-pay | Admitting: Internal Medicine

## 2020-03-20 NOTE — Telephone Encounter (Signed)
New message   Spoke w/pt about scheduling appt with [name]. Pt states that he received a letter from his insurance company stating that as of 02/07/2020, his internal med provider [name] will no longer be covered under his AARP plan 1. He was scheduled with Dr. Rayann Heman. Informed Engineer, building services and she instructed to send message to billing.

## 2020-04-02 NOTE — Progress Notes (Signed)
HPI: FU coronary artery disease and ischemic cardiomyopathy. Patient is status post coronary artery bypass graft in 1996. Patient had an anterior infarct in December of 1829 complicated by ventricular fibrillation arrest. The patient had PCI of his LAD. Course complicated by hypoxic encephalopathy. He is status post ICD 4/14. Cardiac catheterization August 2018 showed severe three-vessel coronary disease with patent saphenous vein graft to the diagonal, patent sequential saphenous vein graft to second and third marginals, patent LAD stent, atretic LIMA to the LAD and occluded saphenous vein graft to the right coronary artery.  There was a 70% proximal right coronary artery stenosis and FFR was 0.88.  Left ventricular end-diastolic pressure was normal. Right heart catheterization September 2019 showed pulmonary capillary wedge pressure of 1, cardiac output 4.1 and cardiac index 2.5. Last echocardiogram August 2021 showed ejection fraction 20 to 25%, mild left ventricular enlargement. Patient has been followed in CHF clinic.  With history of atrial fibrillation.  Was transiently on hospice care.  Since last seen,he has dyspnea with more vigorous activities.  No orthopnea, PND, pedal edema, chest pain or syncope.  Current Outpatient Medications  Medication Sig Dispense Refill   cholecalciferol (VITAMIN D) 1000 units tablet Take 1,000 Units by mouth daily.     cyanocobalamin 1000 MCG tablet Take 2,000 mcg by mouth daily.      docusate sodium (COLACE) 100 MG capsule Take 100 mg by mouth 2 (two) times daily.      ELIQUIS 2.5 MG TABS tablet TAKE 1 TABLET BY MOUTH TWICE A DAY 60 tablet 5   feeding supplement, ENSURE ENLIVE, (ENSURE ENLIVE) LIQD Take 237 mLs by mouth 3 (three) times daily between meals. 237 mL 12   finasteride (PROSCAR) 5 MG tablet Take 5 mg by mouth daily.     furosemide (LASIX) 20 MG tablet TAKE 1 TABLET BY MOUTH EVERY DAY 90 tablet 0   losartan (COZAAR) 25 MG tablet Take 0.5  tablets (12.5 mg total) by mouth at bedtime. 45 tablet 3   Multiple Vitamins-Minerals (ICAPS AREDS 2 PO) Take 1 tablet by mouth 2 (two) times daily.      nitroGLYCERIN (NITROSTAT) 0.4 MG SL tablet Place 1 tablet (0.4 mg total) under the tongue every 5 (five) minutes x 3 doses as needed for chest pain. Take only as needed for chest pain if your blood pressure is greater than 100/80. 25 tablet 12   sulfamethoxazole-trimethoprim (BACTRIM DS) 800-160 MG tablet Take 1 tablet by mouth 2 (two) times daily.      No current facility-administered medications for this visit.     Past Medical History:  Diagnosis Date   AICD (automatic cardioverter/defibrillator) present 09/13/2012   Anemia    CAD (coronary artery disease)    a. s/p remote CABG, b. AL STEMI c/b VF arrest in 05/2012 => LHC 05/10/12: Severe 3v CAD, S-OM1/2 ok, SVG-Dx ok, SVG-RCA occluded, LIMA-LAD atretic, proximal LAD occluded. EF 20% with anterior apical HK=> salvage PCI with POBA and thrombectomy of the occluded LAD;  c. 01/2017: atretic LIMA_LAD and known occluded SVG-RCA. Patent LAD stent and 70% proxRCA stenosis (FFR 0.88).    Chronic systolic heart failure ([place])    a. Echo 12/13: EF 20-25%, anteroseptal, inferior, apical HK, mildly reduced RV function, trivial pericardial effusion;   b.  Echo 3/14:  Ant, septal, apical AK, EF 25%   Clotting disorder ([place])    Depression    "years ago" (02/28/2018)   History of kidney stones    HLD (  hyperlipidemia)    Hypertension    "I've never had high blood pressure; on RX for other reasons" (02/28/2018)   Inguinal hernia    "have one now on my left side" (09/13/2012); (02/28/2018)   Ischemic cardiomyopathy    Myocardial infarction [facility]) 1996;  05/2012   Osteoporosis    Paroxysmal atrial fibrillation ([place]) 03/17/2016   noted on device interrogation,  will follow burden   Pectus excavatum    Pulmonary embolism ([place])    a. after adhesiolysis for SBO in 04/2012 => coumadin for 6  mos   Renal cyst    SBO (small bowel obstruction) [facility]) November 2013   s/p lysis of adhesions   Sinoatrial node dysfunction ([place])    Skin cancer    "left ear; face" (02/28/2018)   Ventricular fibrillation ([place]) 05/2012   . in setting of AL STEMI 12/13 => EF 20-25% => d/c on LifeVest (repeat echo planned in 08/2012)    Past Surgical History:  Procedure Laterality Date   CATARACT EXTRACTION W/ INTRAOCULAR LENS  IMPLANT, BILATERAL Bilateral    COLONOSCOPY     CORONARY ANGIOPLASTY WITH STENT PLACEMENT     "I've got 2 or 3 stents" (02/28/2018)   [place]   "CABG X5" (09/13/2012)   CYSTOSCOPY/RETROGRADE/URETEROSCOPY/STONE EXTRACTION WITH BASKET  1990's   ESOPHAGOGASTRODUODENOSCOPY  05/08/2012   Procedure: ESOPHAGOGASTRODUODENOSCOPY (EGD);  Surgeon: Juanita Craver, MD;  Location: [facility] ENDOSCOPY;  Service: Endoscopy;  Laterality: N/A;  Nevin Bloodgood put on for Dr. Collene Mares , Dr. Collene Mares will call if she wants another time   IMPLANTABLE CARDIOVERTER DEFIBRILLATOR IMPLANT N/A 09/13/2012   SJM Ellipse ER implanted by Dr Rayann Heman for secondary prevention   INGUINAL HERNIA REPAIR Right 1996   INTRAVASCULAR PRESSURE WIRE/FFR STUDY N/A 01/20/2017   Procedure: INTRAVASCULAR PRESSURE WIRE/FFR STUDY;  Surgeon: Wellington Hampshire, MD;  Location: [place] CV LAB;  Service: Cardiovascular;  Laterality: N/A;   LAPAROTOMY  04/27/2012   Procedure: EXPLORATORY LAPAROTOMY;  Surgeon: Gwenyth Ober, MD;  Location: [place];  Service: General;  Laterality: N/A;   LEFT HEART CATH AND CORS/GRAFTS ANGIOGRAPHY N/A 01/20/2017   Procedure: LEFT HEART CATH AND CORS/GRAFTS ANGIOGRAPHY;  Surgeon: Wellington Hampshire, MD;  Location: [place] CV LAB;  Service: Cardiovascular;  Laterality: N/A;   LEFT HEART CATHETERIZATION WITH CORONARY ANGIOGRAM N/A 05/10/2012   Procedure: LEFT HEART CATHETERIZATION WITH CORONARY ANGIOGRAM;  Surgeon: Burnell Blanks, MD;  Location: [facility] CATH LAB;  Service: Cardiovascular;   Laterality: N/A;   PTCA     percutaneous transluminal coronary intervention and brachy therapy, Bruce R. Olevia Perches, MD. EF 60%   RIGHT HEART CATH N/A 03/01/2018   Procedure: RIGHT HEART CATH;  Surgeon: Jolaine Artist, MD;  Location: [place] CV LAB;  Service: Cardiovascular;  Laterality: N/A;   SKIN CANCER EXCISION     "left ear; face" (02/28/2018)   TRANSURETHRAL RESECTION OF PROSTATE      Social History   Socioeconomic History   Marital status: Widowed    Spouse name: Not on file   Number of children: 1   Years of education: Not on file   Highest education level: Not on file  Occupational History   Occupation: [name]    Employer: RETIRED  Tobacco Use   Smoking status: Former Smoker    Types: Cigarettes   Smokeless tobacco: Never Used   Tobacco comment: 09/14/2011; 02/28/2018  "quit smoking when I was ~ 17; probably didn't smoke 10 packs in  my life"  Vaping Use   Vaping Use: Never used  Substance and Sexual Activity   Alcohol use: Never   Drug use: Never   Sexual activity: Not Currently  Other Topics Concern   Not on file  Social History Narrative   Not on file   Social Determinants of Health   Financial Resource Strain:    Difficulty of Paying Living Expenses: Not on file  Food Insecurity:    Worried About [place] in the Last Year: Not on file   Ran Out of Food in the Last Year: Not on file  Transportation Needs:    Lack of Transportation (Medical): Not on file   Lack of Transportation (Non-Medical): Not on file  Physical Activity:    Days of Exercise per Week: Not on file   Minutes of Exercise per Session: Not on file  Stress:    Feeling of Stress : Not on file  Social Connections:    Frequency of Communication with Friends and Family: Not on file   Frequency of Social Gatherings with Friends and Family: Not on file   Attends Religious Services: Not on file   Active Member of Clubs or Organizations: Not  on file   Attends Archivist Meetings: Not on file   Marital Status: Not on file  Intimate Partner Violence:    Fear of Current or Ex-Partner: Not on file   Emotionally Abused: Not on file   Physically Abused: Not on file   Sexually Abused: Not on file    Family History  Problem Relation Age of Onset   Heart disease Father    Other Mother        brain tumor   Colon cancer Brother    Cancer Brother        Liver   Heart disease Brother     ROS: no fevers or chills, productive cough, hemoptysis, dysphasia, odynophagia, melena, hematochezia, dysuria, hematuria, rash, seizure activity, orthopnea, PND, pedal edema, claudication. Remaining systems are negative.  Physical Exam: Well-developed well-nourished in no acute distress.  Skin is warm and dry.  HEENT is normal.  Neck is supple.  Chest is clear to auscultation with normal expansion.  Cardiovascular exam is regular rate and rhythm.  Abdominal exam nontender or distended. No masses palpated. Extremities show no edema. neuro grossly intact  A/P  1 coronary artery disease status post coronary artery bypass graft-resume statin.  No aspirin given need for apixaban.  2 paroxysmal atrial fibrillation-plan to continue apixaban at present dose.  3 ischemic cardiomyopathy-we will continue low-dose losartan.  He has had difficulties with low blood pressure in the past and I therefore would not advance losartan or add a beta-blocker at this point.  He states his blood pressure runs low at home.  4 chronic systolic congestive heart failure-volume status is reasonable.  Continue Lasix at present dose.  Check potassium and renal function.  5 hyperlipidemia-given history of coronary disease we will resume statin.  Add Crestor 20 mg daily.  Check lipids and liver in 12 weeks.  6 prior ICD-Per electrophysiology.  Kirk Ruths, MD

## 2020-04-08 ENCOUNTER — Ambulatory Visit: Payer: Medicare Other | Admitting: Cardiology

## 2020-04-08 ENCOUNTER — Other Ambulatory Visit: Payer: Self-pay

## 2020-04-08 ENCOUNTER — Encounter: Payer: Self-pay | Admitting: Cardiology

## 2020-04-08 VITALS — BP 156/82 | HR 81 | Ht 71.0 in | Wt 136.0 lb

## 2020-04-08 DIAGNOSIS — E785 Hyperlipidemia, unspecified: Secondary | ICD-10-CM

## 2020-04-08 DIAGNOSIS — I255 Ischemic cardiomyopathy: Secondary | ICD-10-CM | POA: Diagnosis not present

## 2020-04-08 DIAGNOSIS — I5022 Chronic systolic (congestive) heart failure: Secondary | ICD-10-CM | POA: Diagnosis not present

## 2020-04-08 DIAGNOSIS — I2510 Atherosclerotic heart disease of native coronary artery without angina pectoris: Secondary | ICD-10-CM | POA: Diagnosis not present

## 2020-04-08 DIAGNOSIS — I10 Essential (primary) hypertension: Secondary | ICD-10-CM

## 2020-04-08 DIAGNOSIS — I480 Paroxysmal atrial fibrillation: Secondary | ICD-10-CM

## 2020-04-08 MED ORDER — ROSUVASTATIN CALCIUM 20 MG PO TABS: 20.0000 mg | ORAL_TABLET | Freq: Every day | ORAL | 3 refills | Status: DC

## 2020-04-08 NOTE — Patient Instructions (Signed)
Medication Instructions:   START ROSUVASTATIN 20 MG ONCE DAILY  *If you need a refill on your cardiac medications before your next appointment, please call your pharmacy*   Lab Work:  Your physician recommends that you return for lab work in: [place]  If you have labs (blood work) drawn today and your tests are completely normal, you will receive your results only by:  [place] (if you have MyChart) OR  A paper copy in the mail If you have any lab test that is abnormal or we need to change your treatment, we will call you to review the results.  Follow-Up: At [facility], you and your health needs are our priority.  As part of our continuing mission to provide you with exceptional heart care, we have created designated Provider Care Teams.  These Care Teams include your primary Cardiologist (physician) and Advanced Practice Providers (APPs -  Physician Assistants and Nurse Practitioners) who all work together to provide you with the care you need, when you need it.  We recommend signing up for the patient portal called "MyChart".  Sign up information is provided on this After Visit Summary.  MyChart is used to connect with patients for Virtual Visits (Telemedicine).  Patients are able to view lab/test results, encounter notes, upcoming appointments, etc.  Non-urgent messages can be sent to your provider as well.   To learn more about what you can do with MyChart, go to [email or web address].    Your next appointment:   3 month(s)  The format for your next appointment:   In Person  Provider:   Kirk Ruths, MD

## 2020-04-15 ENCOUNTER — Other Ambulatory Visit (HOSPITAL_COMMUNITY): Payer: Self-pay | Admitting: Internal Medicine

## 2020-04-15 ENCOUNTER — Encounter: Payer: Medicare Other | Admitting: Internal Medicine

## 2020-04-15 ENCOUNTER — Other Ambulatory Visit: Payer: Self-pay | Admitting: Cardiology

## 2020-04-29 ENCOUNTER — Encounter: Payer: Self-pay | Admitting: Internal Medicine

## 2020-04-29 ENCOUNTER — Ambulatory Visit: Payer: Medicare Other | Admitting: Internal Medicine

## 2020-04-29 ENCOUNTER — Encounter (INDEPENDENT_AMBULATORY_CARE_PROVIDER_SITE_OTHER): Payer: Self-pay

## 2020-04-29 ENCOUNTER — Other Ambulatory Visit: Payer: Self-pay

## 2020-04-29 VITALS — BP 148/84 | HR 83 | Ht 71.0 in | Wt 136.4 lb

## 2020-04-29 DIAGNOSIS — I480 Paroxysmal atrial fibrillation: Secondary | ICD-10-CM

## 2020-04-29 DIAGNOSIS — I5022 Chronic systolic (congestive) heart failure: Secondary | ICD-10-CM | POA: Diagnosis not present

## 2020-04-29 DIAGNOSIS — I4901 Ventricular fibrillation: Secondary | ICD-10-CM

## 2020-04-29 DIAGNOSIS — I255 Ischemic cardiomyopathy: Secondary | ICD-10-CM

## 2020-04-29 DIAGNOSIS — I469 Cardiac arrest, cause unspecified: Secondary | ICD-10-CM | POA: Diagnosis not present

## 2020-04-29 DIAGNOSIS — I10 Essential (primary) hypertension: Secondary | ICD-10-CM | POA: Diagnosis not present

## 2020-04-29 LAB — CUP PACEART INCLINIC DEVICE CHECK
Battery Remaining Longevity: 30 mo
Brady Statistic RA Percent Paced: 16 %
Brady Statistic RV Percent Paced: 3.4 %
Date Time Interrogation Session: 20211122104549
HighPow Impedance: 52.875
Implantable Lead Implant Date: 20140408
Implantable Lead Implant Date: 20140408
Implantable Lead Location: 753859
Implantable Lead Location: 753860
Implantable Pulse Generator Implant Date: 20140408
Lead Channel Impedance Value: 350 Ohm
Lead Channel Impedance Value: 350 Ohm
Lead Channel Pacing Threshold Amplitude: 0.5 V
Lead Channel Pacing Threshold Amplitude: 0.5 V
Lead Channel Pacing Threshold Pulse Width: 0.5 ms
Lead Channel Pacing Threshold Pulse Width: 0.5 ms
Lead Channel Sensing Intrinsic Amplitude: 11.7 mV
Lead Channel Sensing Intrinsic Amplitude: 4.3 mV
Lead Channel Setting Pacing Amplitude: 2 V
Lead Channel Setting Pacing Amplitude: 2.5 V
Lead Channel Setting Pacing Pulse Width: 0.5 ms
Lead Channel Setting Sensing Sensitivity: 0.5 mV
Pulse Gen Serial Number: 7094248

## 2020-04-29 NOTE — Progress Notes (Signed)
PCP: [name], MD Primary Cardiologist: Dr Stanford Breed CHF: Dr Haroldine Laws Primary EP: Dr Tama High is a 86 y.o. male who presents today for routine electrophysiology followup.  Since last being seen in our clinic, the patient reports doing very well.  Today, he denies symptoms of palpitations, chest pain, shortness of breath,  lower extremity edema, dizziness, presyncope, syncope, or ICD shocks.  The patient is otherwise without complaint today.   Past Medical History:  Diagnosis Date  . AICD (automatic cardioverter/defibrillator) present 09/13/2012  . Anemia   . CAD (coronary artery disease)    a. s/p remote CABG, b. AL STEMI c/b VF arrest in 05/2012 => LHC 05/10/12: Severe 3v CAD, S-OM1/2 ok, SVG-Dx ok, SVG-RCA occluded, LIMA-LAD atretic, proximal LAD occluded. EF 20% with anterior apical HK=> salvage PCI with POBA and thrombectomy of the occluded LAD;  c. 01/2017: atretic LIMA_LAD and known occluded SVG-RCA. Patent LAD stent and 70% proxRCA stenosis (FFR 0.88).   . Chronic systolic heart failure ([place])    a. Echo 12/13: EF 20-25%, anteroseptal, inferior, apical HK, mildly reduced RV function, trivial pericardial effusion;   b.  Echo 3/14:  Ant, septal, apical AK, EF 25%  . Clotting disorder ([place])   . Depression    "years ago" (02/28/2018)  . History of kidney stones   . HLD (hyperlipidemia)   . Hypertension    "I've never had high blood pressure; on RX for other reasons" (02/28/2018)  . Inguinal hernia    "have one now on my left side" (09/13/2012); (02/28/2018)  . Ischemic cardiomyopathy   . Myocardial infarction [facility]) 1996;  05/2012  . Osteoporosis   . Paroxysmal atrial fibrillation ([place]) 03/17/2016   noted on device interrogation,  will follow burden  . Pectus excavatum   . Pulmonary embolism ([place])    a. after adhesiolysis for SBO in 04/2012 => coumadin for 6 mos  . Renal cyst   . SBO (small bowel obstruction) [facility]) November 2013   s/p lysis of adhesions  .  Sinoatrial node dysfunction ([facility])   . Skin cancer    "left ear; face" (02/28/2018)  . Ventricular fibrillation ([place]) 05/2012   . in setting of AL STEMI 12/13 => EF 20-25% => d/c on LifeVest (repeat echo planned in 08/2012)   Past Surgical History:  Procedure Laterality Date  . CATARACT EXTRACTION W/ INTRAOCULAR LENS  IMPLANT, BILATERAL Bilateral   . COLONOSCOPY    . CORONARY ANGIOPLASTY WITH STENT PLACEMENT     "I've got 2 or 3 stents" (02/28/2018)  . CORONARY ARTERY BYPASS GRAFT  1996   "CABG X5" (09/13/2012)  . CYSTOSCOPY/RETROGRADE/URETEROSCOPY/STONE EXTRACTION WITH BASKET  1990's  . ESOPHAGOGASTRODUODENOSCOPY  05/08/2012   Procedure: ESOPHAGOGASTRODUODENOSCOPY (EGD);  Surgeon: Juanita Craver, MD;  Location: [facility] ENDOSCOPY;  Service: Endoscopy;  Laterality: N/A;  Nevin Bloodgood put on for Dr. Collene Mares , Dr. Collene Mares will call if she wants another time  . IMPLANTABLE CARDIOVERTER DEFIBRILLATOR IMPLANT N/A 09/13/2012   SJM Ellipse ER implanted by Dr Rayann Heman for secondary prevention  . INGUINAL HERNIA REPAIR Right 1996  . INTRAVASCULAR PRESSURE WIRE/FFR STUDY N/A 01/20/2017   Procedure: INTRAVASCULAR PRESSURE WIRE/FFR STUDY;  Surgeon: Wellington Hampshire, MD;  Location: [place] CV LAB;  Service: Cardiovascular;  Laterality: N/A;  . LAPAROTOMY  04/27/2012   Procedure: EXPLORATORY LAPAROTOMY;  Surgeon: Gwenyth Ober, MD;  Location: [place];  Service: General;  Laterality: N/A;  . LEFT HEART CATH AND CORS/GRAFTS ANGIOGRAPHY N/A 01/20/2017   Procedure:  LEFT HEART CATH AND CORS/GRAFTS ANGIOGRAPHY;  Surgeon: Wellington Hampshire, MD;  Location: [place] CV LAB;  Service: Cardiovascular;  Laterality: N/A;  . LEFT HEART CATHETERIZATION WITH CORONARY ANGIOGRAM N/A 05/10/2012   Procedure: LEFT HEART CATHETERIZATION WITH CORONARY ANGIOGRAM;  Surgeon: Burnell Blanks, MD;  Location: [facility] CATH LAB;  Service: Cardiovascular;  Laterality: N/A;  . PTCA     percutaneous transluminal coronary intervention and brachy therapy, Bruce R.  Olevia Perches, MD. EF 60%  . RIGHT HEART CATH N/A 03/01/2018   Procedure: RIGHT HEART CATH;  Surgeon: Jolaine Artist, MD;  Location: [place] CV LAB;  Service: Cardiovascular;  Laterality: N/A;  . SKIN CANCER EXCISION     "left ear; face" (02/28/2018)  . TRANSURETHRAL RESECTION OF PROSTATE      ROS- all systems are reviewed and negative except as per HPI above  Current Outpatient Medications  Medication Sig Dispense Refill  . cholecalciferol (VITAMIN D) 1000 units tablet Take 1,000 Units by mouth daily.    . cyanocobalamin 1000 MCG tablet Take 2,000 mcg by mouth daily.     Marland Kitchen docusate sodium (COLACE) 100 MG capsule Take 100 mg by mouth 2 (two) times daily.     Marland Kitchen ELIQUIS 2.5 MG TABS tablet TAKE 1 TABLET BY MOUTH TWICE A DAY 60 tablet 5  . feeding supplement, ENSURE ENLIVE, (ENSURE ENLIVE) LIQD Take 237 mLs by mouth 3 (three) times daily between meals. 237 mL 12  . finasteride (PROSCAR) 5 MG tablet Take 5 mg by mouth daily.    . furosemide (LASIX) 20 MG tablet TAKE 1 TABLET BY MOUTH EVERY DAY 90 tablet 0  . losartan (COZAAR) 25 MG tablet Take 0.5 tablets (12.5 mg total) by mouth at bedtime. 45 tablet 3  . Multiple Vitamins-Minerals (ICAPS AREDS 2 PO) Take 1 tablet by mouth 2 (two) times daily.     . nitroGLYCERIN (NITROSTAT) 0.4 MG SL tablet Place 1 tablet (0.4 mg total) under the tongue every 5 (five) minutes x 3 doses as needed for chest pain. Take only as needed for chest pain if your blood pressure is greater than 100/80. 25 tablet 12  . rosuvastatin (CRESTOR) 20 MG tablet Take 1 tablet (20 mg total) by mouth daily. 90 tablet 3   No current facility-administered medications for this visit.    Physical Exam: Vitals:   04/29/20 1010  BP: (!) 148/84  Pulse: 83  SpO2: 95%  Weight: 136 lb 6.4 oz (61.9 kg)  Height: 5\' 11"  (1.803 m)    GEN- The patient is well appearing, alert and oriented x 3 today.   Head- normocephalic, atraumatic Eyes-  Sclera clear, conjunctiva pink Ears-  hearing intact Oropharynx- clear Lungs- Clear to ausculation bilaterally, normal work of breathing Chest- ICD pocket is well healed Heart- Regular rate and rhythm, no murmurs, rubs or gallops, PMI not laterally displaced GI- soft, NT, ND, + BS Extremities- no clubbing, cyanosis, or edema  ICD interrogation- reviewed in detail today,  See PACEART report  ekg tracing ordered today is personally reviewed and shows sinus with PACs, inferior MI (Old), anterior MI (old)  Wt Readings from Last 3 Encounters:  04/29/20 136 lb 6.4 oz (61.9 kg)  04/08/20 136 lb (61.7 kg)  12/25/19 134 lb (60.8 kg)    Assessment and Plan:  1.  Chronic systolic dysfunction/ ischemic CM/ prior VF arrest euvolemic today No ischemic symptoms Stable on an appropriate medical regimen Normal ICD function See Claudia Desanctis Art report Unfortunately he has poor cell  reception in [place] where he lives.  We cannot follow his device remotely.  He had atrial lead noise, reproducible with isometrics.  We reprogrammed 03/19/20 to reduce sensitivity (atrial) to 0.7 from auto).  We also changed atrial tachy detections to 170 bpm. he is not device dependant today He is followed by [place] (now graduated to palliative).  We discussed that at some point, we could opt to turn device therapies off if he has further clinical decline.  He wishes to keep every thing "as is" for now.  2. afib No recent afib by device interrogation  He is on eliquis  3. HTN Stable No change required today  4. HL He is on crestor  Risks, benefits and potential toxicities for medications prescribed and/or refilled reviewed with patient today.   Return to see EP PA in 6 months I will see in a year He does not do remotes  Thompson Grayer MD, [facility] 04/29/2020 10:27 AM

## 2020-04-29 NOTE — Patient Instructions (Addendum)
Medication Instructions:  Your physician recommends that you continue on your current medications as directed. Please refer to the Current Medication list given to you today.  *If you need a refill on your cardiac medications before your next appointment, please call your pharmacy*  Lab Work: None ordered.  If you have labs (blood work) drawn today and your tests are completely normal, you will receive your results only by: [place] MyChart Message (if you have MyChart) OR . A paper copy in the mail If you have any lab test that is abnormal or we need to change your treatment, we will call you to review the results.  Testing/Procedures: None ordered.  Follow-Up: At [facility], you and your health needs are our priority.  As part of our continuing mission to provide you with exceptional heart care, we have created designated Provider Care Teams.  These Care Teams include your primary Cardiologist (physician) and Advanced Practice Providers (APPs -  Physician Assistants and Nurse Practitioners) who all work together to provide you with the care you need, when you need it.  We recommend signing up for the patient portal called "MyChart".  Sign up information is provided on this After Visit Summary.  MyChart is used to connect with patients for Virtual Visits (Telemedicine).  Patients are able to view lab/test results, encounter notes, upcoming appointments, etc.  Non-urgent messages can be sent to your provider as well.   To learn more about what you can do with MyChart, go to [email or web address].    Your next appointment:   Your physician wants you to follow-up in: 6 months with Tommye Standard & 1 year with Dr. Rayann Heman. You will receive a reminder letter in the mail two months in advance. If you don't receive a letter, please call our office to schedule the follow-up appointment.  Remote monitoring is used to monitor your ICD from home. This monitoring reduces the number of office visits required  to check your device to one time per year. It allows [place] to keep an eye on the functioning of your device to ensure it is working properly. You are scheduled for a device check from home on 06/18/20. You may send your transmission at any time that day. If you have a wireless device, the transmission will be sent automatically. After your physician reviews your transmission, you will receive a postcard with your next transmission date.  Other Instructions:

## 2020-05-20 ENCOUNTER — Other Ambulatory Visit: Payer: Medicare Other | Admitting: Hospice

## 2020-05-20 ENCOUNTER — Other Ambulatory Visit: Payer: Self-pay

## 2020-05-20 DIAGNOSIS — I5022 Chronic systolic (congestive) heart failure: Secondary | ICD-10-CM

## 2020-05-20 DIAGNOSIS — Z515 Encounter for palliative care: Secondary | ICD-10-CM

## 2020-05-20 NOTE — Progress Notes (Signed)
Louis Reid Consult Note Telephone: [phone]  Fax: [phone]  PATIENT NAME: Louis Reid DOB: February 28, 1933 MRN: [id number]  PRIMARY CARE PROVIDER:   [name], MD [name], MD [place],  [place] [address]  REFERRING PROVIDER: [name], MD [name], MD [place],  [place] [address]  RESPONSIBLE PARTY:  [id number] [phone]     TELEHEALTH VISIT STATEMENT Due to the COVID-19 crisis, this visit was done via telephone from my office. It was initiated and consented to by this patient and/or family.   RECOMMENDATIONS/PLAN:   Advance Care Planning: Visit to build trust and follow-up on palliative care. Visit consisted of counseling and education dealing with the complex and emotionally intense issues of symptom management and palliative care in the setting of serious and potentially life-threatening illness. Palliative care team will continue to support patient, patient's family, and medical team.  Code Status: Code status reviewed. Patient remains a FULL code; he is willing to revisit.  Goals of Care: Goals of care include to maximize quality of life and symptom management.Palliative care team will continue to support patient, patient's family, and medical team.  Follow EP:PIRJJOACZY care will continue to follow patient for goals of care clarification and symptom management.Follow-up scheduled in 2 months/as needed  Functional status/Symptom management:  In no medical acuity; denies pain/discomfort, endorsed weakness which he understands is related to his heart condition. He continues exercises as tolerated.  Balance of activity performance and rest discussed. Chart review indicates patient visited cardiologist 04/29/2020-report of doing well with no symptoms of palpitations, chest pain, shortness of breath, dizziness or syncope. Patient denies  mentioned symptoms and said overall he is doing well. He is compliant with his medications and knows to call with any concerns. Palliative will continue to monitor for symptom management/decline and make recommendations as needed.     Family /Caregiver/Community Supports: I spent 48 minutes providing this consultation; time includes time spent with patient/family, chart review, provider coordination,  and documentation. More than 50% of the time in this consultation was spent on coordinating communication  HISTORY OF PRESENT ILLNESS:Louis R Jonesis a 86 y.o.year oldmalewith multiple medical problems including CAD, cardiomyopathy, unstable angina, HFrEF, edema, chronic systolic heart failure.Palliative Care was asked to help address goals of care CODE STATUS: Full  PPS: 60%  HOSPICE ELIGIBILITY/DIAGNOSIS: TBD  PAST MEDICAL HISTORY:  Past Medical History:  Diagnosis Date  . AICD (automatic cardioverter/defibrillator) present 09/13/2012  . Anemia   . CAD (coronary artery disease)    a. s/p remote CABG, b. AL STEMI c/b VF arrest in 05/2012 => LHC 05/10/12: Severe 3v CAD, S-OM1/2 ok, SVG-Dx ok, SVG-RCA occluded, LIMA-LAD atretic, proximal LAD occluded. EF 20% with anterior apical HK=> salvage PCI with POBA and thrombectomy of the occluded LAD;  c. 01/2017: atretic LIMA_LAD and known occluded SVG-RCA. Patent LAD stent and 70% proxRCA stenosis (FFR 0.88).   . Chronic systolic heart failure ([place])    a. Echo 12/13: EF 20-25%, anteroseptal, inferior, apical HK, mildly reduced RV function, trivial pericardial effusion;   b.  Echo 3/14:  Ant, septal, apical AK, EF 25%  . Clotting disorder ([place])   . Depression    "years ago" (02/28/2018)  . History of kidney stones   . HLD (hyperlipidemia)   . Hypertension    "I've never had high blood pressure; on RX for other reasons" (02/28/2018)  . Inguinal hernia    "have  one now on my left side" (09/13/2012); (02/28/2018)  . Ischemic cardiomyopathy    . Myocardial infarction [facility]) 1996;  05/2012  . Osteoporosis   . Paroxysmal atrial fibrillation ([place]) 03/17/2016   noted on device interrogation,  will follow burden  . Pectus excavatum   . Pulmonary embolism ([place])    a. after adhesiolysis for SBO in 04/2012 => coumadin for 6 mos  . Renal cyst   . SBO (small bowel obstruction) [facility]) November 2013   s/p lysis of adhesions  . Sinoatrial node dysfunction ([facility])   . Skin cancer    "left ear; face" (02/28/2018)  . Ventricular fibrillation ([place]) 05/2012   . in setting of AL STEMI 12/13 => EF 20-25% => d/c on LifeVest (repeat echo planned in 08/2012)    SOCIAL HX:  Social History   Tobacco Use  . Smoking status: Former Smoker    Types: Cigarettes  . Smokeless tobacco: Never Used  . Tobacco comment: 09/14/2011; 02/28/2018  "quit smoking when I was ~ 17; probably didn't smoke 10 packs in my life"  Substance Use Topics  . Alcohol use: Never    ALLERGIES: No Known Allergies   PERTINENT MEDICATIONS:  Outpatient Encounter Medications as of 05/20/2020  Medication Sig  . cholecalciferol (VITAMIN D) 1000 units tablet Take 1,000 Units by mouth daily.  . cyanocobalamin 1000 MCG tablet Take 2,000 mcg by mouth daily.   Marland Kitchen docusate sodium (COLACE) 100 MG capsule Take 100 mg by mouth 2 (two) times daily.   Marland Kitchen ELIQUIS 2.5 MG TABS tablet TAKE 1 TABLET BY MOUTH TWICE A DAY  . feeding supplement, ENSURE ENLIVE, (ENSURE ENLIVE) LIQD Take 237 mLs by mouth 3 (three) times daily between meals.  . finasteride (PROSCAR) 5 MG tablet Take 5 mg by mouth daily.  . furosemide (LASIX) 20 MG tablet TAKE 1 TABLET BY MOUTH EVERY DAY  . losartan (COZAAR) 25 MG tablet Take 0.5 tablets (12.5 mg total) by mouth at bedtime.  . Multiple Vitamins-Minerals (ICAPS AREDS 2 PO) Take 1 tablet by mouth 2 (two) times daily.   . nitroGLYCERIN (NITROSTAT) 0.4 MG SL tablet Place 1 tablet (0.4 mg total) under the tongue every 5 (five) minutes x 3 doses as needed for chest pain. Take only  as needed for chest pain if your blood pressure is greater than 100/80.  Marland Kitchen rosuvastatin (CRESTOR) 20 MG tablet Take 1 tablet (20 mg total) by mouth daily.   No facility-administered encounter medications on file as of 05/20/2020.    Note:  Portions of this note were generated with Lobbyist. Dictation errors may occur despite attempts at proofreading.  Teodoro Spray, NP

## 2020-06-18 ENCOUNTER — Other Ambulatory Visit: Payer: Self-pay

## 2020-06-18 ENCOUNTER — Ambulatory Visit (INDEPENDENT_AMBULATORY_CARE_PROVIDER_SITE_OTHER): Payer: Medicare Other | Admitting: Emergency Medicine

## 2020-06-18 DIAGNOSIS — I255 Ischemic cardiomyopathy: Secondary | ICD-10-CM

## 2020-06-18 LAB — CUP PACEART INCLINIC DEVICE CHECK
Battery Remaining Longevity: 30 mo
Brady Statistic RA Percent Paced: 13 %
Brady Statistic RV Percent Paced: 4.3 %
Date Time Interrogation Session: 20220111102309
HighPow Impedance: 59.625
Implantable Lead Implant Date: 20140408
Implantable Lead Implant Date: 20140408
Implantable Lead Location: 753859
Implantable Lead Location: 753860
Implantable Pulse Generator Implant Date: 20140408
Lead Channel Impedance Value: 350 Ohm
Lead Channel Impedance Value: 350 Ohm
Lead Channel Pacing Threshold Amplitude: 0.5 V
Lead Channel Pacing Threshold Amplitude: 0.75 V
Lead Channel Pacing Threshold Pulse Width: 0.5 ms
Lead Channel Pacing Threshold Pulse Width: 0.5 ms
Lead Channel Sensing Intrinsic Amplitude: 11.7 mV
Lead Channel Sensing Intrinsic Amplitude: 4.8 mV
Lead Channel Setting Pacing Amplitude: 2 V
Lead Channel Setting Pacing Amplitude: 2.5 V
Lead Channel Setting Pacing Pulse Width: 0.5 ms
Lead Channel Setting Sensing Sensitivity: 0.5 mV
Pulse Gen Serial Number: 7094248

## 2020-06-18 NOTE — Patient Instructions (Addendum)
Medication Instructions:  Your physician recommends that you continue on your current medications as directed. Please refer to the Current Medication list given to you today.  *If you need a refill on your cardiac medications before your next appointment, please call your pharmacy*   Lab Work: None If you have labs (blood work) drawn today and your tests are completely normal, you will receive your results only by: Marland Kitchen MyChart Message (if you have MyChart) OR . A paper copy in the mail If you have any lab test that is abnormal or we need to change your treatment, we will call you to review the results.   Testing/Procedures: None ordered   Follow-Up: At [facility], you and your health needs are our priority.  As part of our continuing mission to provide you with exceptional heart care, we have created designated Provider Care Teams.  These Care Teams include your primary Cardiologist (physician) and Advanced Practice Providers (APPs -  Physician Assistants and Nurse Practitioners) who all work together to provide you with the care you need, when you need it.  We recommend signing up for the patient portal called "MyChart".  Sign up information is provided on this After Visit Summary.  MyChart is used to connect with patients for Virtual Visits (Telemedicine).  Patients are able to view lab/test results, encounter notes, upcoming appointments, etc.  Non-urgent messages can be sent to your provider as well.   To learn more about what you can do with MyChart, go to [email or web address].    Your next appointment:   Follow up as recommended with Dr. Rayann Heman   Follow up in device clinic on September 17, 2020 at 9:20 am.    Other Instructions

## 2020-06-18 NOTE — Progress Notes (Signed)
ICD check in clinic. Normal device function. Thresholds and sensing consistent with previous device measurements. Impedance trends stable over time. AT/AF burden <1%, all appear atrial noise, not a new finding. No ventricular arrhythmias. Histogram distribution appropriate for patient and level of activity. No changes made this session. Device programmed at appropriate safety margins. Device programmed to optimize intrinsic conduction. Estimated longevity 2.0-2.5 years. Pt next in-clinic follow up 09/17/20. Patient education completed.

## 2020-06-27 ENCOUNTER — Other Ambulatory Visit (HOSPITAL_COMMUNITY): Payer: Self-pay | Admitting: Internal Medicine

## 2020-07-09 ENCOUNTER — Other Ambulatory Visit (HOSPITAL_COMMUNITY): Payer: Self-pay | Admitting: *Deleted

## 2020-07-09 MED ORDER — APIXABAN 2.5 MG PO TABS: 2.5000 mg | ORAL_TABLET | Freq: Two times a day (BID) | ORAL | 5 refills | Status: DC

## 2020-07-15 ENCOUNTER — Other Ambulatory Visit: Payer: Self-pay

## 2020-07-15 ENCOUNTER — Other Ambulatory Visit: Payer: Medicare Other | Admitting: Hospice

## 2020-07-15 DIAGNOSIS — Z515 Encounter for palliative care: Secondary | ICD-10-CM

## 2020-07-15 DIAGNOSIS — I5022 Chronic systolic (congestive) heart failure: Secondary | ICD-10-CM

## 2020-07-15 NOTE — Progress Notes (Signed)
[facility] Consult Note Telephone: [phone]  Fax: [phone]  PATIENT NAME: Louis Reid DOB: [birth year] MRN: [id number]  PRIMARY CARE PROVIDER:   [name], MD [name], MD [place],  [place] [address]  REFERRING PROVIDER: [name], MD [name], MD [place],  [place] [address]  RESPONSIBLE PARTY:  [id number]  TELEHEALTH VISIT STATEMENT Due to the COVID-19 crisis, this visit was done via telephone from my office. It was initiated and consented to by this patient and/or family.  Visit is to build trust and highlight Palliative Medicine as specialized medical care for people living with serious illness, aimed at facilitating better quality of life through symptoms relief, assisting with advance care plan and establishing goals of care.   CHIEF COMPLAINT: Follow up palliative visit/chronic systolic heart failure  RECOMMENDATIONS/PLAN:   1. Advance Care Planning/Code Status: Patient affirmed he is a Full code; willing to reconsider when he has significant changes in his health status.   2.  Goals of Care: Goals of care include to maximize quality of life and symptom management.  I spent 16  minutes providing this consultation. More than 50% of the time in this consultation was spent on coordinating communication.  -------------------------------------------------------------------------------------------------------------------------------------------------- 3. Symptom management/Plan:  Chronic systolic heart failure with no exacerbation. Continue Furosemide 20mg  daily and Losartan 12.5mg  daily. Follow up with Cardiologist as planned. Palliative will continue to monitor for symptom management/decline and make recommendations as needed. Return 2 months or prn. Encouraged to call provider sooner with any concerns.    Family  /Caregiver/Community Supports:   HISTORY OF PRESENT ILLNESS:Louis Reid a 87 y.o.year oldmalewith multiple medical problems including Chronic systolic heart failure. He reports no edema in bilateral lower extremities, no significant weight gain, no shortness of breath, no cough. He endorsed shortness of breath with moderate to severe exertion at baseline. He said overall he is doing well, with no chest/body pain or discomfort, not needing extra pillow at night to sleep.  Hx of  CAD, cardiomyopathy, unstable angina, HFrEF, edema, ICD check  04/29/2020 sowed normal device function, next in- clinic f/u 09/17/20. Palliative Care was asked to help address goals of care and complex decision making. This is a follow up visit.  Rest of 10 point ROS asked and negative.  CODE STATUS: Full  PPS: 60%  HOSPICE ELIGIBILITY/DIAGNOSIS: TBD  PAST MEDICAL HISTORY:  Past Medical History:  Diagnosis Date  . AICD (automatic cardioverter/defibrillator) present 09/13/2012  . Anemia   . CAD (coronary artery disease)    a. s/p remote CABG, b. AL STEMI c/b VF arrest in 05/2012 => LHC 05/10/12: Severe 3v CAD, S-OM1/2 ok, SVG-Dx ok, SVG-RCA occluded, LIMA-LAD atretic, proximal LAD occluded. EF 20% with anterior apical HK=> salvage PCI with POBA and thrombectomy of the occluded LAD;  c. 01/2017: atretic LIMA_LAD and known occluded SVG-RCA. Patent LAD stent and 70% proxRCA stenosis (FFR 0.88).   . Chronic systolic heart failure ([place])    a. Echo 12/13: EF 20-25%, anteroseptal, inferior, apical HK, mildly reduced RV function, trivial pericardial effusion;   b.  Echo 3/14:  Ant, septal, apical AK, EF 25%  . Clotting disorder ([place])   . Depression    "years ago" (02/28/2018)  . History of kidney stones   . HLD (hyperlipidemia)   . Hypertension    "I've never had high blood pressure; on RX for other reasons" (02/28/2018)  .  Inguinal hernia    "have one now on my left side" (09/13/2012); (02/28/2018)  . Ischemic  cardiomyopathy   . Myocardial infarction [facility]) 1996;  05/2012  . Osteoporosis   . Paroxysmal atrial fibrillation ([place]) 03/17/2016   noted on device interrogation,  will follow burden  . Pectus excavatum   . Pulmonary embolism ([place])    a. after adhesiolysis for SBO in 04/2012 => coumadin for 6 mos  . Renal cyst   . SBO (small bowel obstruction) [facility]) November 2013   s/p lysis of adhesions  . Sinoatrial node dysfunction ([facility])   . Skin cancer    "left ear; face" (02/28/2018)  . Ventricular fibrillation ([place]) 05/2012   . in setting of AL STEMI 12/13 => EF 20-25% => d/c on LifeVest (repeat echo planned in 08/2012)    SOCIAL HX: Patient lives independently at home.   FAMILY HX:  Family History  Problem Relation Age of Onset  . Heart disease Father   . Other Mother        brain tumor  . Colon cancer Brother   . Cancer Brother        Liver  . Heart disease Brother     Labs:   No results for input(s): WBC, HGB, HCT, PLT, MCV in the last 168 hours. No results for input(s): NA, K, CL, CO2, BUN, CREATININE, GLUCOSE in the last 168 hours. Latest GFR by Cockcroft Gault (not valid in AKI or ESRD) CrCl cannot be calculated (Patient's most recent lab result is older than the maximum 21 days allowed.). No results for input(s): AST, ALT, ALKPHOS, GGT in the last 168 hours.  Invalid input(s): TBILI, CONJBILI, ALB, TOTALPROTEIN No components found for: ALB No results for input(s): APTT, INR in the last 168 hours.  Invalid input(s): PTPATIENT No results for input(s): BNP, PROBNP in the last 168 hours.  ALLERGIES: No Known Allergies    PERTINENT MEDICATIONS:  Outpatient Encounter Medications as of 07/15/2020  Medication Sig  . apixaban (ELIQUIS) 2.5 MG TABS tablet Take 1 tablet (2.5 mg total) by mouth 2 (two) times daily.  . cholecalciferol (VITAMIN D) 1000 units tablet Take 1,000 Units by mouth daily.  . cyanocobalamin 1000 MCG tablet Take 2,000 mcg by mouth daily.   Marland Kitchen docusate sodium  (COLACE) 100 MG capsule Take 100 mg by mouth 2 (two) times daily.  . feeding supplement, ENSURE ENLIVE, (ENSURE ENLIVE) LIQD Take 237 mLs by mouth 3 (three) times daily between meals.  . finasteride (PROSCAR) 5 MG tablet Take 5 mg by mouth daily.  . furosemide (LASIX) 20 MG tablet TAKE 1 TABLET BY MOUTH EVERY DAY  . losartan (COZAAR) 25 MG tablet Take 0.5 tablets (12.5 mg total) by mouth at bedtime.  . Multiple Vitamins-Minerals (ICAPS AREDS 2 PO) Take 1 tablet by mouth 2 (two) times daily.   . nitroGLYCERIN (NITROSTAT) 0.4 MG SL tablet Place 1 tablet (0.4 mg total) under the tongue every 5 (five) minutes x 3 doses as needed for chest pain. Take only as needed for chest pain if your blood pressure is greater than 100/80.  Marland Kitchen rosuvastatin (CRESTOR) 20 MG tablet Take 1 tablet (20 mg total) by mouth daily.   No facility-administered encounter medications on file as of 07/15/2020.    ROS  General: NAD EYES: denies vision changes ENMT: denies dysphagia no xerostomia Cardiovascular: denies chest/body pain Pulmonary: denies  cough, denies SOB,  Abdomen: endorses fair appetite, no constipation,or diarrhea GU: denies dysuria or urinary frequency, wakes up  once to urinate at night MSK:  endorses ROM limitations, no falls reported, no edema Skin: denies rashes or wounds Neurological: endorses weakness at baseline, denies pain, denies insomnia Psych: Endorses positive mood Heme/lymph/immuno: denies bruises, abnormal bleeding   Physical exam deferred due to telehealth visit.  Palliative Care was asked to follow this patient by consultation request of [name], * to help address advance care planning and complex decision making. Thank you for the opportunity to participate in the care of Louis Reid Please call our office at [phone] if we can be of additional assistance.  Note: Portions of this note were generated with Lobbyist. Dictation errors may occur despite  best attempts at proofreading.  Teodoro Spray, NP

## 2020-07-15 NOTE — Progress Notes (Signed)
HPI: FU coronary artery disease and ischemic cardiomyopathy. Patient is status post coronary artery bypass graft in 1996. Patient had an anterior infarct in December of 0000000 complicated by ventricular fibrillation arrest. The patient had PCI of his LAD. Course complicated by hypoxic encephalopathy. He is status post ICD 4/14.Cardiac catheterization August 2018 showed severe three-vessel coronary disease with patent saphenous vein graft to the diagonal, patent sequential saphenous vein graft to second and third marginals, patent LAD stent, atretic LIMA to the LAD and occluded saphenous vein graft to the right coronary artery. There was a 70% proximal right coronary artery stenosis and FFR was 0.88. Left ventricular end-diastolic pressure was normal. Right heart catheterization September 2019 showed pulmonary capillary wedge pressure of 1, cardiac output 4.1 and cardiac index 2.5. Last echocardiogram August 2021 showed ejection fraction 20 to 25%, mild left ventricular enlargement. Patient had been followed in CHF clinic.  Also with history of atrial fibrillation.  Was transiently on hospice care.  Since last seen,he has dyspnea with more vigorous activities.  No orthopnea, PND or pedal edema.  He denies chest pain or syncope.  Current Outpatient Medications  Medication Sig Dispense Refill  . apixaban (ELIQUIS) 2.5 MG TABS tablet Take 1 tablet (2.5 mg total) by mouth 2 (two) times daily. 60 tablet 5  . cholecalciferol (VITAMIN D) 1000 units tablet Take 1,000 Units by mouth daily.    . cyanocobalamin 1000 MCG tablet Take 2,000 mcg by mouth daily.     Marland Kitchen docusate sodium (COLACE) 100 MG capsule Take 100 mg by mouth 2 (two) times daily.    . feeding supplement, ENSURE ENLIVE, (ENSURE ENLIVE) LIQD Take 237 mLs by mouth 3 (three) times daily between meals. 237 mL 12  . Ferrous Sulfate (IRON) 325 (65 Fe) MG TABS Take by mouth. Take 1 tablet by mouth daily    . finasteride (PROSCAR) 5 MG tablet Take 5  mg by mouth daily.    . furosemide (LASIX) 20 MG tablet TAKE 1 TABLET BY MOUTH EVERY DAY (Patient taking differently: 10 mg. Take 1/2 tablet by mouth daily) 90 tablet 0  . losartan (COZAAR) 25 MG tablet Take 0.5 tablets (12.5 mg total) by mouth at bedtime. 45 tablet 3  . Multiple Vitamins-Minerals (ICAPS AREDS 2 PO) Take 1 tablet by mouth 2 (two) times daily.     . nitroGLYCERIN (NITROSTAT) 0.4 MG SL tablet Place 1 tablet (0.4 mg total) under the tongue every 5 (five) minutes x 3 doses as needed for chest pain. Take only as needed for chest pain if your blood pressure is greater than 100/80. 25 tablet 12  . rosuvastatin (CRESTOR) 20 MG tablet Take 1 tablet (20 mg total) by mouth daily. 90 tablet 3   No current facility-administered medications for this visit.     Past Medical History:  Diagnosis Date  . AICD (automatic cardioverter/defibrillator) present 09/13/2012  . Anemia   . CAD (coronary artery disease)    a. s/p remote CABG, b. AL STEMI c/b VF arrest in 05/2012 => LHC 05/10/12: Severe 3v CAD, S-OM1/2 ok, SVG-Dx ok, SVG-RCA occluded, LIMA-LAD atretic, proximal LAD occluded. EF 20% with anterior apical HK=> salvage PCI with POBA and thrombectomy of the occluded LAD;  c. 01/2017: atretic LIMA_LAD and known occluded SVG-RCA. Patent LAD stent and 70% proxRCA stenosis (FFR 0.88).   . Chronic systolic heart failure ([place])    a. Echo 12/13: EF 20-25%, anteroseptal, inferior, apical HK, mildly reduced RV function, trivial pericardial effusion;  b.  Echo 3/14:  Ant, septal, apical AK, EF 25%  . Clotting disorder ([place])   . Depression    "years ago" (02/28/2018)  . History of kidney stones   . HLD (hyperlipidemia)   . Hypertension    "I've never had high blood pressure; on RX for other reasons" (02/28/2018)  . Inguinal hernia    "have one now on my left side" (09/13/2012); (02/28/2018)  . Ischemic cardiomyopathy   . Myocardial infarction [facility]) 1996;  05/2012  . Osteoporosis   . Paroxysmal atrial  fibrillation ([place]) 03/17/2016   noted on device interrogation,  will follow burden  . Pectus excavatum   . Pulmonary embolism ([place])    a. after adhesiolysis for SBO in 04/2012 => coumadin for 6 mos  . Renal cyst   . SBO (small bowel obstruction) [facility]) November 2013   s/p lysis of adhesions  . Sinoatrial node dysfunction (H[facility])   . Skin cancer    "left ear; face" (02/28/2018)  . Ventricular fibrillation ([place]) 05/2012   . in setting of AL STEMI 12/13 => EF 20-25% => d/c on LifeVest (repeat echo planned in 08/2012)    Past Surgical History:  Procedure Laterality Date  . CATARACT EXTRACTION W/ INTRAOCULAR LENS  IMPLANT, BILATERAL Bilateral   . COLONOSCOPY    . CORONARY ANGIOPLASTY WITH STENT PLACEMENT     "I've got 2 or 3 stents" (02/28/2018)  . CORONARY ARTERY BYPASS GRAFT  1996   "CABG X5" (09/13/2012)  . CYSTOSCOPY/RETROGRADE/URETEROSCOPY/STONE EXTRACTION WITH BASKET  1990's  . ESOPHAGOGASTRODUODENOSCOPY  05/08/2012   Procedure: ESOPHAGOGASTRODUODENOSCOPY (EGD);  Surgeon: Juanita Craver, MD;  Location: [facility] ENDOSCOPY;  Service: Endoscopy;  Laterality: N/A;  Nevin Bloodgood put on for Dr. Collene Mares , Dr. Collene Mares will call if she wants another time  . IMPLANTABLE CARDIOVERTER DEFIBRILLATOR IMPLANT N/A 09/13/2012   SJM Ellipse ER implanted by Dr Rayann Heman for secondary prevention  . INGUINAL HERNIA REPAIR Right 1996  . INTRAVASCULAR PRESSURE WIRE/FFR STUDY N/A 01/20/2017   Procedure: INTRAVASCULAR PRESSURE WIRE/FFR STUDY;  Surgeon: Wellington Hampshire, MD;  Location: [facility] CV LAB;  Service: Cardiovascular;  Laterality: N/A;  . LAPAROTOMY  04/27/2012   Procedure: EXPLORATORY LAPAROTOMY;  Surgeon: Gwenyth Ober, MD;  Location: [facility];  Service: General;  Laterality: N/A;  . LEFT HEART CATH AND CORS/GRAFTS ANGIOGRAPHY N/A 01/20/2017   Procedure: LEFT HEART CATH AND CORS/GRAFTS ANGIOGRAPHY;  Surgeon: Wellington Hampshire, MD;  Location: [facility] CV LAB;  Service: Cardiovascular;  Laterality: N/A;  . LEFT HEART  CATHETERIZATION WITH CORONARY ANGIOGRAM N/A 05/10/2012   Procedure: LEFT HEART CATHETERIZATION WITH CORONARY ANGIOGRAM;  Surgeon: Burnell Blanks, MD;  Location: [facility] CATH LAB;  Service: Cardiovascular;  Laterality: N/A;  . PTCA     percutaneous transluminal coronary intervention and brachy therapy, Bruce R. Olevia Perches, MD. EF 60%  . RIGHT HEART CATH N/A 03/01/2018   Procedure: RIGHT HEART CATH;  Surgeon: Jolaine Artist, MD;  Location: [facility] CV LAB;  Service: Cardiovascular;  Laterality: N/A;  . SKIN CANCER EXCISION     "left ear; face" (02/28/2018)  . TRANSURETHRAL RESECTION OF PROSTATE      Social History   Socioeconomic History  . Marital status: Widowed    Spouse name: Not on file  . Number of children: 1  . Years of education: Not on file  . Highest education level: Not on file  Occupational History  . Occupation: [name]    Employer: RETIRED  Tobacco Use  .  Smoking status: Former Smoker    Types: Cigarettes  . Smokeless tobacco: Never Used  . Tobacco comment: 09/14/2011; 02/28/2018  "quit smoking when I was ~ 17; probably didn't smoke 10 packs in my life"  Vaping Use  . Vaping Use: Never used  Substance and Sexual Activity  . Alcohol use: Never  . Drug use: Never  . Sexual activity: Not Currently  Other Topics Concern  . Not on file  Social History Narrative  . Not on file   Social Determinants of Health   Financial Resource Strain: Not on file  Food Insecurity: Not on file  Transportation Needs: Not on file  Physical Activity: Not on file  Stress: Not on file  Social Connections: Not on file  Intimate Partner Violence: Not on file    Family History  Problem Relation Age of Onset  . Heart disease Father   . Other Mother        brain tumor  . Colon cancer Brother   . Cancer Brother        Liver  . Heart disease Brother     ROS: no fevers or chills, productive cough, hemoptysis, dysphasia, odynophagia, melena, hematochezia, dysuria,  hematuria, rash, seizure activity, orthopnea, PND, pedal edema, claudication. Remaining systems are negative.  Physical Exam: Well-developed well-nourished in no acute distress.  Skin is warm and dry.  HEENT is normal.  Neck is supple.  Chest is clear to auscultation with normal expansion.  Pectus excavatum noted Cardiovascular exam is regular rate and rhythm.  Abdominal exam nontender or distended. No masses palpated. Extremities show no edema. neuro grossly intact   A/P  1 coronary artery disease-patient doing well with no chest pain.  Continue medical therapy.  Continue statin.  He is not on aspirin given need for apixaban.  2 paroxysmal atrial fibrillation-we will continue apixaban at present dose.  3 ischemic cardiomyopathy-continue ARB.  His blood pressure has been borderline previously and we have therefore not increased his ARB or adding beta-blockade.  4 chronic systolic congestive heart failure-he appears to be euvolemic.  Continue Lasix.    5 hyperlipidemia-continue statin.  6 prior ICD-per electrophysiology.  Kirk Ruths, MD

## 2020-07-21 ENCOUNTER — Other Ambulatory Visit: Payer: Self-pay | Admitting: Cardiology

## 2020-07-22 ENCOUNTER — Ambulatory Visit: Payer: Medicare Other | Admitting: Cardiology

## 2020-07-22 ENCOUNTER — Encounter: Payer: Self-pay | Admitting: Cardiology

## 2020-07-22 ENCOUNTER — Other Ambulatory Visit: Payer: Self-pay

## 2020-07-22 VITALS — BP 132/78 | HR 82 | Ht 71.0 in | Wt 137.4 lb

## 2020-07-22 DIAGNOSIS — I255 Ischemic cardiomyopathy: Secondary | ICD-10-CM

## 2020-07-22 DIAGNOSIS — I10 Essential (primary) hypertension: Secondary | ICD-10-CM

## 2020-07-22 DIAGNOSIS — I5022 Chronic systolic (congestive) heart failure: Secondary | ICD-10-CM | POA: Diagnosis not present

## 2020-07-22 DIAGNOSIS — I480 Paroxysmal atrial fibrillation: Secondary | ICD-10-CM | POA: Diagnosis not present

## 2020-07-22 DIAGNOSIS — I2510 Atherosclerotic heart disease of native coronary artery without angina pectoris: Secondary | ICD-10-CM

## 2020-07-22 NOTE — Patient Instructions (Signed)

## 2020-09-17 ENCOUNTER — Ambulatory Visit (INDEPENDENT_AMBULATORY_CARE_PROVIDER_SITE_OTHER): Payer: Medicare Other | Admitting: Emergency Medicine

## 2020-09-17 ENCOUNTER — Other Ambulatory Visit: Payer: Self-pay

## 2020-09-17 DIAGNOSIS — I255 Ischemic cardiomyopathy: Secondary | ICD-10-CM

## 2020-09-17 LAB — CUP PACEART INCLINIC DEVICE CHECK
Battery Remaining Longevity: 27 mo
Brady Statistic RA Percent Paced: 12 %
Brady Statistic RV Percent Paced: 4.1 %
Date Time Interrogation Session: 20220412100757
HighPow Impedance: 58.5 Ohm
Implantable Lead Implant Date: 20140408
Implantable Lead Implant Date: 20140408
Implantable Lead Location: 753859
Implantable Lead Location: 753860
Implantable Pulse Generator Implant Date: 20140408
Lead Channel Impedance Value: 337.5 Ohm
Lead Channel Impedance Value: 337.5 Ohm
Lead Channel Pacing Threshold Amplitude: 0.5 V
Lead Channel Pacing Threshold Amplitude: 0.75 V
Lead Channel Pacing Threshold Pulse Width: 0.5 ms
Lead Channel Pacing Threshold Pulse Width: 0.5 ms
Lead Channel Sensing Intrinsic Amplitude: 10.2 mV
Lead Channel Sensing Intrinsic Amplitude: 3 mV
Lead Channel Setting Pacing Amplitude: 2 V
Lead Channel Setting Pacing Amplitude: 2.5 V
Lead Channel Setting Pacing Pulse Width: 0.5 ms
Lead Channel Setting Sensing Sensitivity: 0.5 mV
Pulse Gen Serial Number: 7094248

## 2020-09-17 NOTE — Progress Notes (Signed)
ICD check in clinic. Normal device function. Thresholds and sensing consistent with previous device measurements. Impedance trends stable over time. AMS Burden <1%, available EGMs reveiweed, appear atrial noise which is a known issue.  No ventricular arrhythmias. Histogram distribution appropriate for patient and level of activity. No changes made this session. Device programmed at appropriate safety margins. Device programmed to optimize intrinsic conduction. Estimated longevity 1.9 years. Pt device is not able to be followed remotely.  Patient scheduled for follow-up check in device clinic on 12/24/20.    Patient education completed including shock plan. Vibratory alert demonstrated.

## 2020-09-24 ENCOUNTER — Other Ambulatory Visit (HOSPITAL_COMMUNITY): Payer: Self-pay | Admitting: Internal Medicine

## 2020-09-30 ENCOUNTER — Other Ambulatory Visit: Payer: Self-pay

## 2020-09-30 ENCOUNTER — Other Ambulatory Visit: Payer: Medicare Other | Admitting: Hospice

## 2020-09-30 DIAGNOSIS — I5022 Chronic systolic (congestive) heart failure: Secondary | ICD-10-CM

## 2020-09-30 DIAGNOSIS — R600 Localized edema: Secondary | ICD-10-CM

## 2020-09-30 DIAGNOSIS — Z515 Encounter for palliative care: Secondary | ICD-10-CM

## 2020-09-30 NOTE — Progress Notes (Signed)
[facility] Palliative Care Consult Note Telephone: [phone]  Fax: [phone]  PATIENT NAME: Louis Reid DOB: 06/13/1932 MRN: [id number]  PRIMARY CARE PROVIDER:   [name], MD [name], MD [address] [place],  [place] [address]  REFERRING PROVIDER: [name], MD [name], MD [place],  [place] [address]  RESPONSIBLE PARTY:  Self [place]    Name Relation Home Work Mobile   [place], Tiberio   [phone]      TELEHEALTH VISIT STATEMENT Due to the COVID-19 crisis, this visit was done via telemedicine and it was initiated and consent by this patient and or family. Video-audio (telehealth) contact was unable to be done due to technical barriers from the patient's side.  Visit is to build trust and highlight Palliative Medicine as specialized medical care for people living with serious illness, aimed at facilitating better quality of life through symptoms relief, assisting with advance care planning and complex medical decision making.   RECOMMENDATIONS/PLAN:   Advance Care Planning/Code Status: Patient is a Full code  Goals of Care: Goals of care include to maximize quality of life and symptom management.  Visit consisted of counseling and education dealing with the complex and emotionally intense issues of symptom management and palliative care in the setting of serious and potentially life-threatening illness. Palliative care team will continue to support patient, patient's family, and medical team.  Symptom management/Plan:  Edema in BLE related to CHF: Take Lasix 20mg  daily as ordered, not to skip as he currently does. Benefit of elevation of BLE discussed. No added salt; reduced processed foods.  Angina: No report of angina since last visit Hypertension: Stable. Managed with Losartan.  Follow up: Palliative care will continue to follow for  complex medical decision making, advance care planning, and clarification of goals. Return 6 weeks or prn. Encouraged to call provider sooner with any concerns.  CHIEF COMPLAINT: Palliative follow up visit/Edema  HISTORY OF PRESENT ILLNESS:  Louis Reid a 87 y.o. male with multiple medical problems including edema to BLE, trace, non pitting in the past week. This is likely related to hx of Chronic systolic heart failure. He affirmed he skips his lasix sometimes; he denies no significant weight gain, no new shortness of breath, no cough, no chest pain/discomfort. He endorsed chronic shortness of breath with moderate to severe exertion at baseline.   Hx of  CAD, cardiomyopathy, unstable angina, HFrEF, edema, ICD check 09/17/2020 showed normal device function. Palliative Care was asked to help address goals of care and complex decision making. History obtained from review of EMR, discussion with patient. Records reviewed and summarized above. All 10 point systems reviewed and are negative except as documented in history of present illness above  Review and summarization of Epic records shows history from other than patient.   Palliative Care was asked to follow this patient by consultation request of [name], * to help address complex decision making in the context of advance care planning and goals of care clarification.   CODE STATUS: Full  PPS: 60%  HOSPICE ELIGIBILITY/DIAGNOSIS: TBD  PAST MEDICAL HISTORY:  Past Medical History:  Diagnosis Date  . AICD (automatic cardioverter/defibrillator) present 09/13/2012  . Anemia   . CAD (coronary artery disease)    a. s/p remote CABG, b. AL STEMI c/b VF arrest in 05/2012 => LHC 05/10/12: Severe 3v CAD, S-OM1/2 ok, SVG-Dx ok, SVG-RCA occluded, LIMA-LAD atretic,  proximal LAD occluded. EF 20% with anterior apical HK=> salvage PCI with POBA and thrombectomy of the occluded LAD;  c. 01/2017: atretic LIMA_LAD and known occluded SVG-RCA. Patent  LAD stent and 70% proxRCA stenosis (FFR 0.88).   . Chronic systolic heart failure ([place])    a. Echo 12/13: EF 20-25%, anteroseptal, inferior, apical HK, mildly reduced RV function, trivial pericardial effusion;   b.  Echo 3/14:  Ant, septal, apical AK, EF 25%  . Clotting disorder ([place])   . Depression    "years ago" (02/28/2018)  . History of kidney stones   . HLD (hyperlipidemia)   . Hypertension    "I've never had high blood pressure; on RX for other reasons" (02/28/2018)  . Inguinal hernia    "have one now on my left side" (09/13/2012); (02/28/2018)  . Ischemic cardiomyopathy   . Myocardial infarction [facility]) 1996;  05/2012  . Osteoporosis   . Paroxysmal atrial fibrillation ([place]) 03/17/2016   noted on device interrogation,  will follow burden  . Pectus excavatum   . Pulmonary embolism ([place])    a. after adhesiolysis for SBO in 04/2012 => coumadin for 6 mos  . Renal cyst   . SBO (small bowel obstruction) [facility]) November 2013   s/p lysis of adhesions  . Sinoatrial node dysfunction (HCC)   . Skin cancer    "left ear; face" (02/28/2018)  . Ventricular fibrillation ([place]) 05/2012   . in setting of AL STEMI 12/13 => EF 20-25% => d/c on LifeVest (repeat echo planned in 08/2012)     SOCIAL HX: @SOCX  Patient lives at home for ongoing care   FAMILY HX:  Family History  Problem Relation Age of Onset  . Heart disease Father   . Other Mother        brain tumor  . Colon cancer Brother   . Cancer Brother        Liver  . Heart disease Brother     Review lab tests/diagnostics Results for KEONTAY, VORA (MRN [id number]) as of 09/30/2020 10:15  Ref. Range 01/08/2020 12:01  Sodium Latest Ref Range: 135 - 145 mmol/L 142  Potassium Latest Ref Range: 3.5 - 5.1 mmol/L 4.8  Chloride Latest Ref Range: 98 - 111 mmol/L 110  CO2 Latest Ref Range: 22 - 32 mmol/L 25  Glucose Latest Ref Range: 70 - 99 mg/dL 85  BUN Latest Ref Range: 8 - 23 mg/dL 28 (H)  Creatinine Latest Ref Range: 0.61 - 1.24 mg/dL 1.44 (H)   Calcium Latest Ref Range: 8.9 - 10.3 mg/dL 9.1  Anion gap Latest Ref Range: 5 - 15  7  GFR, Est Non African American Latest Ref Range: >60 mL/min 44 (L)  GFR, Est African American Latest Ref Range: >60 mL/min 51 (L)  B Natriuretic Peptide Latest Ref Range: 0.0 - 100.0 pg/mL 305.4 (H)  WBC Latest Ref Range: 4.0 - 10.5 K/uL 5.0  RBC Latest Ref Range: 4.22 - 5.81 MIL/uL 4.14 (L)  Hemoglobin Latest Ref Range: 13.0 - 17.0 g/dL 13.2  HCT Latest Ref Range: 39.0 - 52.0 % 42.0  MCV Latest Ref Range: 80.0 - 100.0 fL 101.4 (H)  MCH Latest Ref Range: 26.0 - 34.0 pg 31.9    ALLERGIES: No Known Allergies    PERTINENT MEDICATIONS:  Outpatient Encounter Medications as of 09/30/2020  Medication Sig  . apixaban (ELIQUIS) 2.5 MG TABS tablet Take 1 tablet (2.5 mg total) by mouth 2 (two) times daily.  . cholecalciferol (VITAMIN D) 1000 units tablet Take  1,000 Units by mouth daily.  . cyanocobalamin 1000 MCG tablet Take 2,000 mcg by mouth daily.   Marland Kitchen docusate sodium (COLACE) 100 MG capsule Take 100 mg by mouth 2 (two) times daily.  . feeding supplement, ENSURE ENLIVE, (ENSURE ENLIVE) LIQD Take 237 mLs by mouth 3 (three) times daily between meals.  . Ferrous Sulfate (IRON) 325 (65 Fe) MG TABS Take by mouth. Take 1 tablet by mouth daily  . finasteride (PROSCAR) 5 MG tablet Take 5 mg by mouth daily.  . furosemide (LASIX) 20 MG tablet TAKE 1 TABLET BY MOUTH EVERY DAY  . losartan (COZAAR) 25 MG tablet Take 0.5 tablets (12.5 mg total) by mouth at bedtime.  . Multiple Vitamins-Minerals (ICAPS AREDS 2 PO) Take 1 tablet by mouth 2 (two) times daily.   . nitroGLYCERIN (NITROSTAT) 0.4 MG SL tablet Place 1 tablet (0.4 mg total) under the tongue every 5 (five) minutes x 3 doses as needed for chest pain. Take only as needed for chest pain if your blood pressure is greater than 100/80.  Marland Kitchen rosuvastatin (CRESTOR) 20 MG tablet Take 1 tablet (20 mg total) by mouth daily.   No facility-administered encounter medications on  file as of 09/30/2020.      ROS  General: NAD EYES: denies vision changes ENMT: denies dysphagia no xerostomia Cardiovascular: denies chest/body pain, trace edema to BLE, non pitting Pulmonary: denies cough, denies SOB at rest or mild exertion.  Abdomen: endorses fair appetite, no constipation,or diarrhea GU: denies dysuria or urinary frequency, wakes up once to urinate at night MSK: endorses ROM limitations, no falls reported Skin: denies rashes or wounds Neurological: endorses weakness at baseline, denies pain, denies insomnia Psych: Endorses positive mood Heme/lymph/immuno: denies bruises, abnormal bleeding    Thank you for the opportunity to participate in the care of VAUN HYNDMAN Please call our office at [phone] if we can be of additional assistance.  Note: Portions of this note were generated with [employer]. Dictation errors may occur despite best attempts at proofreading.  Teodoro Spray, NP

## 2020-10-14 ENCOUNTER — Other Ambulatory Visit (HOSPITAL_COMMUNITY): Payer: Self-pay | Admitting: Internal Medicine

## 2020-10-22 ENCOUNTER — Telehealth: Payer: Self-pay | Admitting: Cardiology

## 2020-10-22 NOTE — Telephone Encounter (Signed)
Patient mailed Cardiology  recall letter dated 09/20/20 back to our office. Letter was not clear as to who the patient needed to see and when his appointment was due--patient states he has 3 cardiologist  And it is hard to keep up with which one he needs to see.  I told the patient this recall was for Dr. Stanford Breed and he was due to see him in August of this year.  We scheduled the appointment for Monday 01/27/21 at 10:30 am.  Patient did express his concern over not being able to speak with a person when he does manage to get someone on the phone.  I did apologize for the trouble ulbe he had been having and explained we are working on solutions to improve our phone service. Patient voiced he understood how technology works, he just wanted to make [place] aware of some of our problems.Marland Kitchen

## 2020-11-25 ENCOUNTER — Other Ambulatory Visit: Payer: Self-pay

## 2020-11-25 ENCOUNTER — Other Ambulatory Visit: Payer: Medicare Other | Admitting: Hospice

## 2020-11-25 DIAGNOSIS — I10 Essential (primary) hypertension: Secondary | ICD-10-CM

## 2020-11-25 DIAGNOSIS — Z515 Encounter for palliative care: Secondary | ICD-10-CM

## 2020-11-25 DIAGNOSIS — I5022 Chronic systolic (congestive) heart failure: Secondary | ICD-10-CM

## 2020-11-25 NOTE — Progress Notes (Signed)
[facility] Consult Note Telephone: [phone]  Fax: [phone]  PATIENT NAME: Louis Reid DOB: 1932/08/01 MRN: [id number]  PRIMARY CARE PROVIDER:   [name], MD [name], MD [address] [place],  [place] [address]  REFERRING PROVIDER: [name], MD [name], MD [place],  [place] [address]  RESPONSIBLE PARTY:  Self [place]     Name Relation Home Work Mobile   Avik, Leoni   [phone]       Visit is to build trust and highlight Palliative Medicine as specialized medical care for people living with serious illness, aimed at facilitating better quality of life through symptoms relief, assisting with advance care planning and complex medical decision making. This is a follow up visit.  RECOMMENDATIONS/PLAN:   Advance Care Planning/Code Status:Code status reviewed. Patient affirmed he is a Full code.   Goals of Care: Goals of care include to maximize quality of life and symptom management.  Visit consisted of counseling and education dealing with the complex and emotionally intense issues of symptom management and palliative care in the setting of serious and potentially life-threatening illness. Palliative care team will continue to support patient, patient's family, and medical team.  Symptom management/Plan:  Systolic CHF:  Continue Lasix 20mg  daily as ordered; he is compliant.  Benefit of elevation of BLE emphasized.  No added salt; reduced processed foods. He denies chest pain or discomfort; no palpitations.  Hypertension: Stable. Managed with Losartan.  Appetite is fair to good, does not eat much food but takes Ensure daily.  Follow up: Palliative care will continue to follow for complex medical decision making, advance care planning, and clarification of goals. Return 6 weeks or prn. Encouraged to call provider sooner  with any concerns.  Follow up: Palliative care will continue to follow for complex medical decision making, advance care planning, and clarification of goals. Return 6 weeks or prn. Encouraged to call provider sooner with any concerns.  CHIEF COMPLAINT: Palliative follow up  HISTORY OF PRESENT ILLNESS:  Louis Reid a 87 y.o. male with multiple medical problems including systolic congestive heart failure, no exacerbation.  History of CAD, cardiomyopathy, unstable angina, HFrEF, edema,  ICD check 09/17/2020 showed normal device function.Marland Kitchen History obtained from review of EMR, discussion with primary team, family and/or patient. Records reviewed and summarized above. All 10 point systems reviewed and are negative except as documented in history of present illness above  Review and summarization of Epic records shows history from other than patient.   Palliative Care was asked to follow this patient o help address complex decision making in the context of advance care planning and goals of care clarification.   PHYSICAL EXAM  118/68 R 18 P56 02 98% RA General: In no acute distress, appropriately dressed Cardiovascular: regular rate and rhythm; no edema in BLE Pulmonary: no cough, no increased work of breathing, normal respiratory effort Abdomen: soft, non tender, no guarding, positive bowel sounds in all quadrants GU:  no suprapubic tenderness Eyes: Normal lids, no discharge ENMT: Moist mucous membranes Musculoskeletal:  weakness, moderate sarcopenia Skin: no rash to visible skin, warm without cyanosis,  Psych: non-anxious affect Neurological: Weakness but otherwise non focal Heme/lymph/immuno: no bruises, no bleeding  PERTINENT MEDICATIONS:  Outpatient Encounter Medications as of 11/25/2020  Medication Sig   apixaban (ELIQUIS) 2.5 MG TABS tablet Take 1 tablet (2.5 mg total) by mouth 2 (two) times daily.  cholecalciferol (VITAMIN D) 1000 units tablet Take 1,000 Units by mouth daily.    cyanocobalamin 1000 MCG tablet Take 2,000 mcg by mouth daily.    docusate sodium (COLACE) 100 MG capsule Take 100 mg by mouth 2 (two) times daily.   feeding supplement, ENSURE ENLIVE, (ENSURE ENLIVE) LIQD Take 237 mLs by mouth 3 (three) times daily between meals.   Ferrous Sulfate (IRON) 325 (65 Fe) MG TABS Take by mouth. Take 1 tablet by mouth daily   finasteride (PROSCAR) 5 MG tablet Take 5 mg by mouth daily.   furosemide (LASIX) 20 MG tablet TAKE 1 TABLET BY MOUTH EVERY DAY   losartan (COZAAR) 25 MG tablet Take 0.5 tablets (12.5 mg total) by mouth at bedtime. Needs appt for further refills   Multiple Vitamins-Minerals (ICAPS AREDS 2 PO) Take 1 tablet by mouth 2 (two) times daily.    nitroGLYCERIN (NITROSTAT) 0.4 MG SL tablet Place 1 tablet (0.4 mg total) under the tongue every 5 (five) minutes x 3 doses as needed for chest pain. Take only as needed for chest pain if your blood pressure is greater than 100/80.   rosuvastatin (CRESTOR) 20 MG tablet Take 1 tablet (20 mg total) by mouth daily.   No facility-administered encounter medications on file as of 11/25/2020.    HOSPICE ELIGIBILITY/DIAGNOSIS: TBD  PAST MEDICAL HISTORY:  Past Medical History:  Diagnosis Date   AICD (automatic cardioverter/defibrillator) present 09/13/2012   Anemia    CAD (coronary artery disease)    a. s/p remote CABG, b. AL STEMI c/b VF arrest in 05/2012 => LHC 05/10/12: Severe 3v CAD, S-OM1/2 ok, SVG-Dx ok, SVG-RCA occluded, LIMA-LAD atretic, proximal LAD occluded. EF 20% with anterior apical HK=> salvage PCI with POBA and thrombectomy of the occluded LAD;  c. 01/2017: atretic LIMA_LAD and known occluded SVG-RCA. Patent LAD stent and 70% proxRCA stenosis (FFR 0.88).    Chronic systolic heart failure ([place])    a. Echo 12/13: EF 20-25%, anteroseptal, inferior, apical HK, mildly reduced RV function, trivial pericardial effusion;   b.  Echo 3/14:  Ant, septal, apical AK, EF 25%   Clotting disorder ([place])    Depression     "years ago" (02/28/2018)   History of kidney stones    HLD (hyperlipidemia)    Hypertension    "I've never had high blood pressure; on RX for other reasons" (02/28/2018)   Inguinal hernia    "have one now on my left side" (09/13/2012); (02/28/2018)   Ischemic cardiomyopathy    Myocardial infarction [facility]) 1996;  05/2012   Osteoporosis    Paroxysmal atrial fibrillation ([place]) 03/17/2016   noted on device interrogation,  will follow burden   Pectus excavatum    Pulmonary embolism ([place])    a. after adhesiolysis for SBO in 04/2012 => coumadin for 6 mos   Renal cyst    SBO (small bowel obstruction) [facility]) November 2013   s/p lysis of adhesions   Sinoatrial node dysfunction ([place])    Skin cancer    "left ear; face" (02/28/2018)   Ventricular fibrillation ([place]) 05/2012   . in setting of AL STEMI 12/13 => EF 20-25% => d/c on LifeVest (repeat echo planned in 08/2012)     ALLERGIES: No Known Allergies    I spent  60 minutes providing this consultation; this includes time spent with patient/family, chart review and documentation. More than 50% of the time in this consultation was spent on counseling and coordinating communication   Thank you for the opportunity to participate  in the care of WHITLEY STRYCHARZ Please call our office at [phone] if we can be of additional assistance.  Note: Portions of this note were generated with Lobbyist. Dictation errors may occur despite best attempts at proofreading.  Teodoro Spray, NP

## 2020-12-23 ENCOUNTER — Other Ambulatory Visit (HOSPITAL_COMMUNITY): Payer: Self-pay | Admitting: Internal Medicine

## 2020-12-24 ENCOUNTER — Ambulatory Visit (INDEPENDENT_AMBULATORY_CARE_PROVIDER_SITE_OTHER): Payer: Medicare Other

## 2020-12-24 ENCOUNTER — Other Ambulatory Visit: Payer: Self-pay

## 2020-12-24 DIAGNOSIS — I4901 Ventricular fibrillation: Secondary | ICD-10-CM | POA: Diagnosis not present

## 2020-12-24 LAB — CUP PACEART INCLINIC DEVICE CHECK
Battery Remaining Longevity: 21 mo
Brady Statistic RA Percent Paced: 16 %
Brady Statistic RV Percent Paced: 4.1 %
Date Time Interrogation Session: 20220719102655
HighPow Impedance: 64.125
Implantable Lead Implant Date: 20140408
Implantable Lead Implant Date: 20140408
Implantable Lead Location: 753859
Implantable Lead Location: 753860
Implantable Pulse Generator Implant Date: 20140408
Lead Channel Impedance Value: 337.5 Ohm
Lead Channel Impedance Value: 337.5 Ohm
Lead Channel Pacing Threshold Amplitude: 0.5 V
Lead Channel Pacing Threshold Amplitude: 0.75 V
Lead Channel Pacing Threshold Pulse Width: 0.5 ms
Lead Channel Pacing Threshold Pulse Width: 0.5 ms
Lead Channel Sensing Intrinsic Amplitude: 11.7 mV
Lead Channel Sensing Intrinsic Amplitude: 3.4 mV
Lead Channel Setting Pacing Amplitude: 2 V
Lead Channel Setting Pacing Amplitude: 2.5 V
Lead Channel Setting Pacing Pulse Width: 0.5 ms
Lead Channel Setting Sensing Sensitivity: 0.5 mV
Pulse Gen Serial Number: 7094248

## 2020-12-24 NOTE — Progress Notes (Signed)
ICD check in clinic. Normal device function. Thresholds and sensing consistent with previous device measurements. Impedance trends stable over time. AT/AF burden <1%, EGM's appear noise which is not new for patient. 2 NSVT events logged, 1 with EGM, < 20 beats. Histogram distribution appropriate for patient and level of activity. No changes made this session. Device programmed at appropriate safety margins. Device programmed to optimize intrinsic conduction. Estimated longevity 1.5-1.8 years. Pt declines remote monitoring. Patient education completed including shock plan. Auditory/vibratory alert demonstrated. Next in-clinic check 03/26/21. Message sent to scheduler to contact patient about overdue OV.

## 2021-01-15 NOTE — Progress Notes (Signed)
HPI: FU coronary artery disease and ischemic cardiomyopathy. Patient is status post coronary artery bypass graft in 1996. Patient had an anterior infarct in December of 0000000 complicated by ventricular fibrillation arrest. The patient had PCI of his LAD. Course complicated by hypoxic encephalopathy. He is status post ICD 4/14. Cardiac catheterization August 2018 showed severe three-vessel coronary disease with patent saphenous vein graft to the diagonal, patent sequential saphenous vein graft to second and third marginals, patent LAD stent, atretic LIMA to the LAD and occluded saphenous vein graft to the right coronary artery.  There was a 70% proximal right coronary artery stenosis and FFR was 0.88.  Left ventricular end-diastolic pressure was normal. Right heart catheterization September 2019 showed pulmonary capillary wedge pressure of 1, cardiac output 4.1 and cardiac index 2.5. Last echocardiogram August 2021 showed ejection fraction 20 to 25%, mild left ventricular enlargement. Patient had been followed in CHF clinic.  Also with history of atrial fibrillation.  Was transiently on hospice care.  Since last seen, patient states that over the past 4 to 6 weeks he has noticed worsening fatigue, weight loss, decreased appetite.  Also with some dyspnea on exertion but no orthopnea, PND, worsening pedal edema, chest pain or syncope.  Current Outpatient Medications  Medication Sig Dispense Refill   apixaban (ELIQUIS) 2.5 MG TABS tablet Take 1 tablet (2.5 mg total) by mouth 2 (two) times daily. 60 tablet 5   cholecalciferol (VITAMIN D) 1000 units tablet Take 1,000 Units by mouth daily.     cyanocobalamin 1000 MCG tablet Take 2,000 mcg by mouth daily.      docusate sodium (COLACE) 100 MG capsule Take 100 mg by mouth 2 (two) times daily.     feeding supplement, ENSURE ENLIVE, (ENSURE ENLIVE) LIQD Take 237 mLs by mouth 3 (three) times daily between meals. 237 mL 12   Ferrous Sulfate (IRON) 325 (65 Fe) MG  TABS Take by mouth. Take 1 tablet by mouth daily     finasteride (PROSCAR) 5 MG tablet Take 5 mg by mouth daily.     furosemide (LASIX) 20 MG tablet TAKE 1 TABLET BY MOUTH EVERY DAY 90 tablet 0   losartan (COZAAR) 25 MG tablet TAKE 0.5 TABLETS (12.5 MG TOTAL) BY MOUTH AT BEDTIME. NEEDS APPT FOR FURTHER REFILLS 45 tablet 1   Multiple Vitamins-Minerals (ICAPS AREDS 2 PO) Take 1 tablet by mouth 2 (two) times daily.      nitroGLYCERIN (NITROSTAT) 0.4 MG SL tablet Place 1 tablet (0.4 mg total) under the tongue every 5 (five) minutes x 3 doses as needed for chest pain. Take only as needed for chest pain if your blood pressure is greater than 100/80. 25 tablet 12   rosuvastatin (CRESTOR) 20 MG tablet Take 1 tablet (20 mg total) by mouth daily. 90 tablet 3   No current facility-administered medications for this visit.     Past Medical History:  Diagnosis Date   AICD (automatic cardioverter/defibrillator) present 09/13/2012   Anemia    CAD (coronary artery disease)    a. s/p remote CABG, b. AL STEMI c/b VF arrest in 05/2012 => LHC 05/10/12: Severe 3v CAD, S-OM1/2 ok, SVG-Dx ok, SVG-RCA occluded, LIMA-LAD atretic, proximal LAD occluded. EF 20% with anterior apical HK=> salvage PCI with POBA and thrombectomy of the occluded LAD;  c. 01/2017: atretic LIMA_LAD and known occluded SVG-RCA. Patent LAD stent and 70% proxRCA stenosis (FFR 0.88).    Chronic systolic heart failure ([place])    a. Echo 12/13:  EF 20-25%, anteroseptal, inferior, apical HK, mildly reduced RV function, trivial pericardial effusion;   b.  Echo 3/14:  Ant, septal, apical AK, EF 25%   Clotting disorder ([place])    Depression    "years ago" (02/28/2018)   History of kidney stones    HLD (hyperlipidemia)    Hypertension    "I've never had high blood pressure; on RX for other reasons" (02/28/2018)   Inguinal hernia    "have one now on my left side" (09/13/2012); (02/28/2018)   Ischemic cardiomyopathy    Myocardial infarction [facility]) 1996;  05/2012    Osteoporosis    Paroxysmal atrial fibrillation ([place]) 03/17/2016   noted on device interrogation,  will follow burden   Pectus excavatum    Pulmonary embolism ([place])    a. after adhesiolysis for SBO in 04/2012 => coumadin for 6 mos   Renal cyst    SBO (small bowel obstruction) [facility]) November 2013   s/p lysis of adhesions   Sinoatrial node dysfunction ([place])    Skin cancer    "left ear; face" (02/28/2018)   Ventricular fibrillation ([place]) 05/2012   . in setting of AL STEMI 12/13 => EF 20-25% => d/c on LifeVest (repeat echo planned in 08/2012)    Past Surgical History:  Procedure Laterality Date   CATARACT EXTRACTION W/ INTRAOCULAR LENS  IMPLANT, BILATERAL Bilateral    COLONOSCOPY     CORONARY ANGIOPLASTY WITH STENT PLACEMENT     "I've got 2 or 3 stents" (02/28/2018)   [place]   "CABG X5" (09/13/2012)   CYSTOSCOPY/RETROGRADE/URETEROSCOPY/STONE EXTRACTION WITH BASKET  1990's   ESOPHAGOGASTRODUODENOSCOPY  05/08/2012   Procedure: ESOPHAGOGASTRODUODENOSCOPY (EGD);  Surgeon: Juanita Craver, MD;  Location: [facility] ENDOSCOPY;  Service: Endoscopy;  Laterality: N/A;  Nevin Bloodgood put on for Dr. Collene Mares , Dr. Collene Mares will call if she wants another time   IMPLANTABLE CARDIOVERTER DEFIBRILLATOR IMPLANT N/A 09/13/2012   SJM Ellipse ER implanted by Dr Rayann Heman for secondary prevention   INGUINAL HERNIA REPAIR Right 1996   INTRAVASCULAR PRESSURE WIRE/FFR STUDY N/A 01/20/2017   Procedure: INTRAVASCULAR PRESSURE WIRE/FFR STUDY;  Surgeon: Wellington Hampshire, MD;  Location: [place] CV LAB;  Service: Cardiovascular;  Laterality: N/A;   LAPAROTOMY  04/27/2012   Procedure: EXPLORATORY LAPAROTOMY;  Surgeon: Gwenyth Ober, MD;  Location: [place];  Service: General;  Laterality: N/A;   LEFT HEART CATH AND CORS/GRAFTS ANGIOGRAPHY N/A 01/20/2017   Procedure: LEFT HEART CATH AND CORS/GRAFTS ANGIOGRAPHY;  Surgeon: Wellington Hampshire, MD;  Location: [place] CV LAB;  Service: Cardiovascular;  Laterality: N/A;   LEFT  HEART CATHETERIZATION WITH CORONARY ANGIOGRAM N/A 05/10/2012   Procedure: LEFT HEART CATHETERIZATION WITH CORONARY ANGIOGRAM;  Surgeon: Burnell Blanks, MD;  Location[facility] CATH LAB;  Service: Cardiovascular;  Laterality: N/A;   PTCA     percutaneous transluminal coronary intervention and brachy therapy, Bruce R. Olevia Perches, MD. EF 60%   RIGHT HEART CATH N/A 03/01/2018   Procedure: RIGHT HEART CATH;  Surgeon: Jolaine Artist, MD;  Location: [place] CV LAB;  Service: Cardiovascular;  Laterality: N/A;   SKIN CANCER EXCISION     "left ear; face" (02/28/2018)   TRANSURETHRAL RESECTION OF PROSTATE      Social History   Socioeconomic History   Marital status: Widowed    Spouse name: Not on file   Number of children: 1   Years of education: Not on file   Highest education level: Not on file  Occupational History  Occupation: [name]    Employer: RETIRED  Tobacco Use   Smoking status: Former    Types: Cigarettes   Smokeless tobacco: Never   Tobacco comments:    09/14/2011; 02/28/2018  "quit smoking when I was ~ 17; probably didn't smoke 10 packs in my life"  Vaping Use   Vaping Use: Never used  Substance and Sexual Activity   Alcohol use: Never   Drug use: Never   Sexual activity: Not Currently  Other Topics Concern   Not on file  Social History Narrative   Not on file   Social Determinants of Health   Financial Resource Strain: Not on file  Food Insecurity: Not on file  Transportation Needs: Not on file  Physical Activity: Not on file  Stress: Not on file  Social Connections: Not on file  Intimate Partner Violence: Not on file    Family History  Problem Relation Age of Onset   Heart disease Father    Other Mother        brain tumor   Colon cancer Brother    Cancer Brother        Liver   Heart disease Brother     ROS: Fatigue but no fevers or chills, productive cough, hemoptysis, dysphasia, odynophagia, melena, hematochezia, dysuria, hematuria,  rash, seizure activity, orthopnea, PND, pedal edema, claudication. Remaining systems are negative.  Physical Exam: Well-developed frail in no acute distress.  Skin is warm and dry.  HEENT is normal.  Neck is supple.  Chest is clear to auscultation with normal expansion.  Pectus excavatum Cardiovascular exam is regular rate and rhythm.  Abdominal exam nontender or distended. No masses palpated. Extremities show trace edema. neuro grossly intact  ECG-sinus rhythm with PACs, intermittent atrial pacing, lateral T wave inversion, prior inferior infarct.  Personally reviewed  A/P  1 coronary artery disease-patient denies chest pain.  Continue statin.  He is not on aspirin given need for anticoagulation.  2 paroxysmal atrial fibrillation-continue apixaban.  3 ischemic cardiomyopathy-patient complains of fatigue, weakness, decreased appetite and weight loss.  He may be having low output symptoms.  Will check hemoglobin, TSH.  Add Jardiance 10 mg daily.  I will not add beta-blockade in the setting of hypotension and history of possible low output.  I am hesitant to increase his losartan as he has had difficulties with lightheadedness at times.  Patient asked about being hospitalized today but I am not clear as to what therapy we would do differently in the hospital at this point.  I explained that he does have an ischemic cardiomyopathy.  He is on hospice/palliative care and we will increase frequency of visits.  He states he is considering assisted living as well.  We will arrange repeat echocardiogram as well.  4 chronic systolic congestive heart failure-he does not appear to be volume overloaded.  We will add spironolactone 12.5 mg daily.  Check potassium and renal function in 1 week.  5 hyperlipidemia-continue statin.  6 ICD-managed by electrophysiology.  Kirk Ruths, MD

## 2021-01-19 ENCOUNTER — Other Ambulatory Visit: Payer: Self-pay | Admitting: Cardiology

## 2021-01-27 ENCOUNTER — Other Ambulatory Visit: Payer: Self-pay

## 2021-01-27 ENCOUNTER — Ambulatory Visit: Payer: Medicare Other | Admitting: Cardiology

## 2021-01-27 ENCOUNTER — Encounter: Payer: Self-pay | Admitting: Cardiology

## 2021-01-27 VITALS — BP 132/74 | HR 76 | Ht 71.0 in | Wt 126.8 lb

## 2021-01-27 DIAGNOSIS — I2510 Atherosclerotic heart disease of native coronary artery without angina pectoris: Secondary | ICD-10-CM

## 2021-01-27 DIAGNOSIS — I255 Ischemic cardiomyopathy: Secondary | ICD-10-CM | POA: Diagnosis not present

## 2021-01-27 DIAGNOSIS — I480 Paroxysmal atrial fibrillation: Secondary | ICD-10-CM | POA: Diagnosis not present

## 2021-01-27 DIAGNOSIS — I5022 Chronic systolic (congestive) heart failure: Secondary | ICD-10-CM

## 2021-01-27 DIAGNOSIS — Z95810 Presence of automatic (implantable) cardiac defibrillator: Secondary | ICD-10-CM

## 2021-01-27 MED ORDER — EMPAGLIFLOZIN 10 MG PO TABS: 10.0000 mg | ORAL_TABLET | Freq: Every day | ORAL | 12 refills | Status: DC

## 2021-01-27 MED ORDER — SPIRONOLACTONE 25 MG PO TABS: 12.5000 mg | ORAL_TABLET | Freq: Every day | ORAL | 3 refills | Status: DC

## 2021-01-27 NOTE — Patient Instructions (Signed)
Medication Instructions:   START JARDIANCE 10 MG ONCE DAILY  START SPIRONOLACTONE 12.5 MG ONCE DAILY=1/2 OF THE 25 MG TABLET ONCE DAILY  *If you need a refill on your cardiac medications before your next appointment, please call your pharmacy*   Lab Work:  Your physician recommends that you HAVE LAB [place]  Your physician recommends that you return for lab work in: [place]  If you have labs (blood work) drawn today and your tests are completely normal, you will receive your results only by: [place] (if you have MyChart) OR A paper copy in the mail If you have any lab test that is abnormal or we need to change your treatment, we will call you to review the results.   Testing/Procedures:  Your physician has requested that you have an echocardiogram. Echocardiography is a painless test that uses sound waves to create images of your heart. It provides your doctor with information about the size and shape of your heart and how well your heart's chambers and valves are working. This procedure takes approximately one hour. There are no restrictions for this procedure. [place]   Follow-Up: At [facility], you and your health needs are our priority.  As part of our continuing mission to provide you with exceptional heart care, we have created designated Provider Care Teams.  These Care Teams include your primary Cardiologist (physician) and Advanced Practice Providers (APPs -  Physician Assistants and Nurse Practitioners) who all work together to provide you with the care you need, when you need it.  We recommend signing up for the patient portal called "MyChart".  Sign up information is provided on this After Visit Summary.  MyChart is used to connect with patients for Virtual Visits (Telemedicine).  Patients are able to view lab/test results, encounter notes, upcoming appointments, etc.  Non-urgent messages can be sent to your provider as well.   To learn more  about what you can do with MyChart, go to [email or web address].    Your next appointment:   3 month(s)  The format for your next appointment:   In Person  Provider:   Kirk Ruths, MD

## 2021-01-27 NOTE — Progress Notes (Signed)
Heart and Vascular Care Navigation  01/27/2021  WALDO BRUCATO 1933/05/14 [id number]  Reason for Referral:    Engaged with patient face to face for initial visit for Heart and Vascular Care Coordination.                                                                                                   Assessment:      LCSW was requested to f/u with pt during his appointment today at [facility] office. LCSW introduced self, role, reason for visit. Pt confirmed his home address and contact information; his "friend" brought him to appointment today. He lives at home alone, his wife has passed and he shares his son does not live nearby, that he is helpful as he is able but is currently recovering from [place]. He denies utilizing any DME to get around, he is active with hospice services at home but is unclear what agency it is through. Per his report he is working on placement at [facility] at [place]- unclear where he is in that process. He perseverates on needing "to get things sorted" so he can feel better and manage what he needs. LCSW inquired if pt has what he needs in the home currently (food, toiletries etc) and he confirms he does. When I asked if it would be helpful for pt to have food or meals delivered he states that his neighbors can do that. I then asked if he would be open to someone coming and assisting with daily needs (some ADLs/home cleaning etc) and he declines stating he is particular about things like that and he doesn't want someone coming in the home at this time.   Pt does not state any specific needs from this Probation officer but is appreciative of my concern/visit. Since he declines me arranging home health/aide services for home I inquired if I could reach out to hospice services, his son and Demetrius Charity at [place] to see how we could be of assistance. Pt agreeable and gives verbal permission. I provided my card to pt and showed him how to find my number on the card for any additional  questions/concerns.                     HRT/VAS Care Coordination     Patients Home Cardiology Office [place] Team Social Worker   Social Worker Name: Westley Hummer, LCSW, [facility]   Living arrangements for the past 2 months Single Family Home   Lives with: Self   Patient Current Insurance Coverage Managed Medicare   Patient Has Concern With Paying Medical Bills No   Does Patient Have Prescription Coverage? Yes   Home Assistive Devices/Equipment Cane (specify quad or straight)   DME Agency NA   [place] [facility] [place]       Social History:  SDOH Screenings   Alcohol Screen: Not on file  Depression (PHQ2-9): Not on file  Financial Resource Strain: Not on file  Food Insecurity: No Food Insecurity   Worried About Running Out of Food in the Last Year: Never true   Ran Out of Food in the Last Year: Never true  Housing: Low Risk    Last Housing Risk Score: 0  Physical Activity: Not on file  Social Connections: Not on file  Stress: Not on file  Tobacco Use: Medium Risk   Smoking Tobacco Use: Former   Smokeless Tobacco Use: Never  Transportation Needs: No Transportation Needs   Lack of Transportation (Medical): No   Lack of Transportation (Non-Medical): No    SDOH Interventions: Food Insecurity:  Food Insecurity Interventions: Intervention Not Indicated  Housing Insecurity:  Housing Interventions: Intervention Not Indicated  Transportation:   Transportation Interventions: Intervention Not Indicated   Other Care Navigation Interventions:     Provided Pharmacy assistance resources  N/a  Patient Referred to: F/u with Manufacturing engineer, [facility] [place] and pt son   Follow-up plan:   LCSW was able to identify that pt has been active with [facility] palliative services since 02/2018. I reached out to liaison Edwin Cap, shared that pt has some concerns with how he is doing but that it was unclear much of what he needs at home. Bevely Palmer  will connect with main office and request a visit for pt. I will reach out to [facility] [place] and pt son before the end of the week. I remain available for pt moving forward.

## 2021-01-27 NOTE — Addendum Note (Signed)
Addended by: Cristopher Estimable on: 01/27/2021 11:49 AM   Modules accepted: Orders

## 2021-01-28 ENCOUNTER — Encounter: Payer: Self-pay | Admitting: *Deleted

## 2021-01-28 ENCOUNTER — Telehealth: Payer: Self-pay | Admitting: Licensed Clinical Social Worker

## 2021-01-28 LAB — BASIC METABOLIC PANEL
BUN/Creatinine Ratio: 17 (ref 10–24)
BUN: 28 mg/dL — ABNORMAL HIGH (ref 8–27)
CO2: 22 mmol/L (ref 20–29)
Calcium: 9.7 mg/dL (ref 8.6–10.2)
Chloride: 106 mmol/L (ref 96–106)
Creatinine, Ser: 1.66 mg/dL — ABNORMAL HIGH (ref 0.76–1.27)
Glucose: 82 mg/dL (ref 65–99)
Potassium: 4.7 mmol/L (ref 3.5–5.2)
Sodium: 145 mmol/L — ABNORMAL HIGH (ref 134–144)
eGFR: 40 mL/min/{1.73_m2} — ABNORMAL LOW (ref >59)

## 2021-01-28 LAB — CBC
Hematocrit: 42.1 % (ref 37.5–51.0)
Hemoglobin: 13.9 g/dL (ref 13.0–17.7)
MCH: 30.9 pg (ref 26.6–33.0)
MCHC: 33 g/dL (ref 31.5–35.7)
MCV: 94 fL (ref 79–97)
Platelets: 121 10*3/uL — ABNORMAL LOW (ref 150–450)
RBC: 4.5 x10E6/uL (ref 4.14–5.80)
RDW: 13 % (ref 11.6–15.4)
WBC: 5 10*3/uL (ref 3.4–10.8)

## 2021-01-28 LAB — TSH: TSH: 4.49 u[IU]/mL (ref 0.450–4.500)

## 2021-01-28 NOTE — Telephone Encounter (Signed)
LCSW left message for pt son [name] to f/u on pt appt yesterday and gain greater clarity on any resources pt could benefit from. No answer, left message on voicemail at number provided by pt yesterday [phone].  Westley Hummer, MSW, [place]  [phone]

## 2021-01-29 ENCOUNTER — Other Ambulatory Visit: Payer: Self-pay

## 2021-01-29 ENCOUNTER — Other Ambulatory Visit: Payer: Medicare Other | Admitting: Hospice

## 2021-01-29 DIAGNOSIS — Z515 Encounter for palliative care: Secondary | ICD-10-CM

## 2021-01-29 DIAGNOSIS — I5022 Chronic systolic (congestive) heart failure: Secondary | ICD-10-CM

## 2021-01-29 DIAGNOSIS — I10 Essential (primary) hypertension: Secondary | ICD-10-CM

## 2021-01-29 NOTE — Telephone Encounter (Signed)
LCSW received a call back from pt son [name], I introduced self, role, reason for call. Pt son confirms he is recovering from [place] but usually is in regular contact with his father and assists as able. He lives in [place]. Confirms pt has all necessities at home and declines at additional assistance with arranging in home services. He and pt plan to tour [facility] when he has recovered and he thinks that pt is leaning towards moving in there since they have a family friend that lives there as well. He inquired if Dr. Stanford Breed or PCP would complete paperwork for placement. I clarified that usually the PCP assists with that paperwork as it asks about overall health care needs which may encompass cardiology but will touch on other areas as well. If he runs into any issues then we can troubleshoot those from our office.  Pt son shares that he will reach out to this writer and let me know what he and pt decide and if there are any additional needs that arise. Encouraged him that I remain available to assist logistically as able.   Westley Hummer, MSW, [place]  [phone]

## 2021-01-29 NOTE — Progress Notes (Signed)
Cardiology Clinic Note   Patient Name: Louis Reid Date of Encounter: 01/30/2021  Primary Care Provider:  [name], MD Primary Cardiologist:  Louis Ruths, MD  Patient Profile    Louis Reid presents to the clinic today for follow-up evaluation of his coronary artery disease and ischemic cardiomyopathy.  Past Medical History    Past Medical History:  Diagnosis Date   AICD (automatic cardioverter/defibrillator) present 09/13/2012   Anemia    CAD (coronary artery disease)    a. s/p remote CABG, b. AL STEMI c/b VF arrest in 05/2012 => LHC 05/10/12: Severe 3v CAD, S-OM1/2 ok, SVG-Dx ok, SVG-RCA occluded, LIMA-LAD atretic, proximal LAD occluded. EF 20% with anterior apical HK=> salvage PCI with POBA and thrombectomy of the occluded LAD;  c. 01/2017: atretic LIMA_LAD and known occluded SVG-RCA. Patent LAD stent and 70% proxRCA stenosis (FFR 0.88).    Chronic systolic heart failure ([place])    a. Echo 12/13: EF 20-25%, anteroseptal, inferior, apical HK, mildly reduced RV function, trivial pericardial effusion;   b.  Echo 3/14:  Ant, septal, apical AK, EF 25%   Clotting disorder ([place])    Depression    "years ago" (02/28/2018)   History of kidney stones    HLD (hyperlipidemia)    Hypertension    "I've never had high blood pressure; on RX for other reasons" (02/28/2018)   Inguinal hernia    "have one now on my left side" (09/13/2012); (02/28/2018)   Ischemic cardiomyopathy    Myocardial infarction [facility]) 1996;  05/2012   Osteoporosis    Paroxysmal atrial fibrillation ([place]) 03/17/2016   noted on device interrogation,  will follow burden   Pectus excavatum    Pulmonary embolism ([place])    a. after adhesiolysis for SBO in 04/2012 => coumadin for 6 mos   Renal cyst    SBO (small bowel obstruction) [facility]) November 2013   s/p lysis of adhesions   Sinoatrial node dysfunction ([place])    Skin cancer    "left ear; face" (02/28/2018)   Ventricular fibrillation ([place]) 05/2012   . in  setting of AL STEMI 12/13 => EF 20-25% => d/c on LifeVest (repeat echo planned in 08/2012)   Past Surgical History:  Procedure Laterality Date   CATARACT EXTRACTION W/ INTRAOCULAR LENS  IMPLANT, BILATERAL Bilateral    COLONOSCOPY     CORONARY ANGIOPLASTY WITH STENT PLACEMENT     "I've got 2 or 3 stents" (02/28/2018)   [place]   "CABG X5" (09/13/2012)   CYSTOSCOPY/RETROGRADE/URETEROSCOPY/STONE EXTRACTION WITH BASKET  1990's   ESOPHAGOGASTRODUODENOSCOPY  05/08/2012   Procedure: ESOPHAGOGASTRODUODENOSCOPY (EGD);  Surgeon: Louis Craver, MD;  Location: [facility] ENDOSCOPY;  Service: Endoscopy;  Laterality: N/A;  Louis Reid put on for Louis. Collene Reid , Louis. Collene Reid will call if she wants another time   IMPLANTABLE CARDIOVERTER DEFIBRILLATOR IMPLANT N/A 09/13/2012   SJM Ellipse ER implanted by Louis Reid for secondary prevention   INGUINAL HERNIA REPAIR Right 1996   INTRAVASCULAR PRESSURE WIRE/FFR STUDY N/A 01/20/2017   Procedure: INTRAVASCULAR PRESSURE WIRE/FFR STUDY;  Surgeon: Louis Hampshire, MD;  Location: [facility] CV LAB;  Service: Cardiovascular;  Laterality: N/A;   LAPAROTOMY  04/27/2012   Procedure: EXPLORATORY LAPAROTOMY;  Surgeon: Louis Ober, MD;  Location: [facility];  Service: General;  Laterality: N/A;   LEFT HEART CATH AND CORS/GRAFTS ANGIOGRAPHY N/A 01/20/2017   Procedure: LEFT HEART CATH AND CORS/GRAFTS ANGIOGRAPHY;  Surgeon: Louis Hampshire, MD;  Location: [facility] CV  LAB;  Service: Cardiovascular;  Laterality: N/A;   LEFT HEART CATHETERIZATION WITH CORONARY ANGIOGRAM N/A 05/10/2012   Procedure: LEFT HEART CATHETERIZATION WITH CORONARY ANGIOGRAM;  Surgeon: Louis Blanks, MD;  Location: [facility] CATH LAB;  Service: Cardiovascular;  Laterality: N/A;   PTCA     percutaneous transluminal coronary intervention and brachy therapy, Louis R. Olevia Perches, MD. EF 60%   RIGHT HEART CATH N/A 03/01/2018   Procedure: RIGHT HEART CATH;  Surgeon: Louis Artist, MD;  Location: [facility] CV  LAB;  Service: Cardiovascular;  Laterality: N/A;   SKIN CANCER EXCISION     "left ear; face" (02/28/2018)   TRANSURETHRAL RESECTION OF PROSTATE      Allergies  No Known Allergies  History of Present Illness    Louis Reid is a PMH of paroxysmal atrial fibrillation, ischemic cardiomyopathy, chronic systolic CHF, coronary artery disease, and is status post ICD.  He underwent CABG in 1996.  He had an anterior infarct 123456 which was complicated by ventricular fibrillation arrest.  He received PCI to his LAD.  His course was complicated by hypoxic encephalopathy.  His ICD was inserted 4/14.  His cardiac catheterization 8/18 showed severe three-vessel coronary artery disease with patent SVG to diagonal, patent SVG to second and third marginals, patent LAD stent, and atretic LIMA to LAD and an occluded SVG to his RCA.  He was noted to have 70% proximal RCA stenosis and FFR showed 0.88.  His LVEDP was normal.  He underwent right heart catheterization 9/19 which showed pulmonary capillary wedge pressure of 1, cardiac output 4.1 and cardiac index 2.5.  His echocardiogram 8/21 showed an EF of 20-25%, mild left ventricular enlargement.  He has been followed by the advanced heart failure clinic.  He is on hospice care.  He was seen by Louis. Stanford Reid on 01/27/2021.  During that time he reported that over the last 4-6 weeks he has become increasingly fatigued, lost weight, and had decreased appetite.  He noted some dyspnea on exertion but no orthopnea, PND, increasing lower extremity edema, chest pain or syncope.  Spironolactone 12.5 mg daily was added to his medication regimen.  He presents the clinic today for follow-up evaluation states he is in the process of looking for assisted living facilities to help with his meals and to receive assistance with daily type activities.  He reports early satiety.  We discussed the importance of smaller more frequent meals and hydration.  He also reports increased fatigue.   We reviewed the importance of slower pace due to his heart failure.  He has not yet started his spironolactone but is tolerating Jardiance well.  I instructed him to come towards the end of next week for his follow-up lab work.  I will have him follow-up as scheduled with Louis. Stanford Reid.  Today he denies chest pain, shortness of breath, lower extremity edema, fatigue, palpitations, melena, hematuria, hemoptysis, diaphoresis, weakness, presyncope, syncope, orthopnea, and PND.   Home Medications    Prior to Admission medications   Medication Sig Start Date End Date Taking? Authorizing Provider  apixaban (ELIQUIS) 2.5 MG TABS tablet Take 1 tablet (2.5 mg total) by mouth 2 (two) times daily. 07/09/20   Bensimhon, Shaune Pascal, MD  cholecalciferol (VITAMIN D) 1000 units tablet Take 1,000 Units by mouth daily.    [provider]  cyanocobalamin 1000 MCG tablet Take 2,000 mcg by mouth daily.     [provider]  docusate sodium (COLACE) 100 MG capsule Take 100 mg by mouth  2 (two) times daily.    [provider]  empagliflozin (JARDIANCE) 10 MG TABS tablet Take 1 tablet (10 mg total) by mouth daily before breakfast. 01/27/21   Lelon Perla, MD  feeding supplement, ENSURE ENLIVE, (ENSURE ENLIVE) LIQD Take 237 mLs by mouth 3 (three) times daily between meals. 03/03/18   Georgiana Shore, NP  Ferrous Sulfate (IRON) 325 (65 Fe) MG TABS Take by mouth. Take 1 tablet by mouth daily    [provider]  finasteride (PROSCAR) 5 MG tablet Take 5 mg by mouth daily. 08/30/19   [provider]  furosemide (LASIX) 20 MG tablet TAKE 1 TABLET BY MOUTH EVERY DAY 01/20/21   Lelon Perla, MD  losartan (COZAAR) 25 MG tablet TAKE 0.5 TABLETS (12.5 MG TOTAL) BY MOUTH AT BEDTIME. NEEDS APPT FOR FURTHER REFILLS 12/25/20   Bensimhon, Shaune Pascal, MD  Multiple Vitamins-Minerals (ICAPS AREDS 2 PO) Take 1 tablet by mouth 2 (two) times daily.     [provider]  nitroGLYCERIN  (NITROSTAT) 0.4 MG SL tablet Place 1 tablet (0.4 mg total) under the tongue every 5 (five) minutes x 3 doses as needed for chest pain. Take only as needed for chest pain if your blood pressure is greater than 100/80. 01/22/17   Carlota Raspberry, Tiffany, [name]  rosuvastatin (CRESTOR) 20 MG tablet Take 1 tablet (20 mg total) by mouth daily. 04/08/20 01/27/21  Lelon Perla, MD  spironolactone (ALDACTONE) 25 MG tablet Take 0.5 tablets (12.5 mg total) by mouth daily. 01/27/21 04/27/21  Lelon Perla, MD    Family History    Family History  Problem Relation Age of Onset   Heart disease Father    Other Mother        brain tumor   Colon cancer Brother    Cancer Brother        Liver   Heart disease Brother    He indicated that his mother is deceased. He indicated that his father is deceased. He indicated that his maternal grandmother is deceased. He indicated that his maternal grandfather is deceased. He indicated that his paternal grandmother is deceased. He indicated that his paternal grandfather is deceased.  Social History    Social History   Socioeconomic History   Marital status: Widowed    Spouse name: Not on file   Number of children: 1   Years of education: Not on file   Highest education level: Not on file  Occupational History   Occupation: [name]    Employer: RETIRED  Tobacco Use   Smoking status: Former    Types: Cigarettes   Smokeless tobacco: Never   Tobacco comments:    09/14/2011; 02/28/2018  "quit smoking when I was ~ 17; probably didn't smoke 10 packs in my life"  Vaping Use   Vaping Use: Never used  Substance and Sexual Activity   Alcohol use: Never   Drug use: Never   Sexual activity: Not Currently  Other Topics Concern   Not on file  Social History Narrative   Not on file   Social Determinants of Health   Financial Resource Strain: Low Risk    Difficulty of Paying Living Expenses: Not very hard  Food Insecurity: No Food Insecurity   Worried  About Running Out of Food in the Last Year: Never true   [place] in the Last Year: Never true  Transportation Needs: No Transportation Needs   Lack of Transportation (Medical): No  Lack of Transportation (Non-Medical): No  Physical Activity: Not on file  Stress: Not on file  Social Connections: Not on file  Intimate Partner Violence: Not on file     Review of Systems    General:  No chills, fever, night sweats or weight changes.  Cardiovascular:  No chest pain, dyspnea on exertion, edema, orthopnea, palpitations, paroxysmal nocturnal dyspnea. Dermatological: No rash, lesions/masses Respiratory: No cough, dyspnea Urologic: No hematuria, dysuria Abdominal:   No nausea, vomiting, diarrhea, bright red blood per rectum, melena, or hematemesis Neurologic:  No visual changes, wkns, changes in mental status. All other systems reviewed and are otherwise negative except as noted above.  Physical Exam    VS:  BP 120/70 (BP Location: Left Arm, Patient Position: Sitting, Cuff Size: Normal)   Pulse 84   Ht '5\' 11"'$  (1.803 m)   Wt 126 lb 6.4 oz (57.3 kg)   SpO2 98%   BMI 17.63 kg/m  , BMI Body mass index is 17.63 kg/m. GEN: Well nourished, well developed, in no acute distress. HEENT: normal. Neck: Supple, no JVD, carotid bruits, or masses. Cardiac: RRR, no murmurs, rubs, or gallops. No clubbing, cyanosis, edema.  Radials/DP/PT 2+ and equal bilaterally.  Respiratory:  Respirations regular and unlabored, clear to auscultation bilaterally. GI: Soft, nontender, nondistended, BS + x 4. MS: no deformity or atrophy. Skin: warm and dry, no rash. Neuro:  Strength and sensation are intact. Psych: Normal affect.  Accessory Clinical Findings    Recent Labs: 01/27/2021: BUN 28; Creatinine, Ser 1.66; Hemoglobin 13.9; Platelets 121; Potassium 4.7; Sodium 145; TSH 4.490   Recent Lipid Panel    Component Value Date/Time   CHOL 89 (L) 08/24/2016 0931   TRIG 65 08/24/2016 0931   HDL 37 (L)  08/24/2016 0931   CHOLHDL 2.4 08/24/2016 0931   CHOLHDL 2.4 07/29/2015 0837   VLDL 15 07/29/2015 0837   LDLCALC 39 08/24/2016 0931    ECG personally reviewed by me today-none today.  Echocardiogram 01/08/20 IMPRESSIONS     1. Left ventricular ejection fraction, by estimation, is 20 to 25%. The  left ventricle has severely decreased function. The left ventricle has no  regional wall motion abnormalities. The left ventricular internal cavity  size was mildly dilated. Left  ventricular diastolic parameters are indeterminate.   2. Right ventricular systolic function is normal. The right ventricular  size is normal. There is normal pulmonary artery systolic pressure.   3. The mitral valve is normal in structure. Trivial mitral valve  regurgitation. No evidence of mitral stenosis.   4. The aortic valve is normal in structure. Aortic valve regurgitation is  not visualized. No aortic stenosis is present.   5. The inferior vena cava is dilated in size with <50% respiratory  variability, suggesting right atrial pressure of 15 mmHg.   Comparison(s): No significant change from prior study. Prior images  reviewed side by side.   Assessment & Plan   1.  Chronic systolic CHF-continues to be euvolemic.  Echocardiogram 8/21 showed EF 20-25%.  Spironolactone 12.5 mg daily added to his medication regimen on 01/27/2021.  BMP ordered.  Continues with increased fatigue. Continue furosemide, losartan, spironolactone Heart healthy low-sodium diet-salty 6 given Maintain physical activity as tolerated Pace physical activity More frequent small meals throughout the day/sip fluids throughout the day  Paroxysmal atrial fibrillation-heart rate today 84 .  No recent episodes of increased or irregular heartbeats.  Reports compliance with apixaban and denies bleeding issues. Continue apixaban Heart healthy low-sodium diet-salty 6 given  Maintain physical activity as tolerated Avoid triggers caffeine,  chocolate, EtOH, dehydration etc.  Coronary artery disease-denies arm neck back and chest discomfort.  Status post CABG treatment cardiac catheterization with PCI. Continue rosuvastatin, nitroglycerin,  Hyperlipidemia-LDL 39 on 08/24/2016 Continue rosuvastatin Heart healthy low-sodium high-fiber diet Increase physical activity as tolerated  ICD-implanted 4/14.  Device check 12/28/2020.  Battery life 22 months remaining. Follows with Louis. Rayann Reid  Disposition: Follow-up with Louis. Stanford Reid as scheduled.   Jossie Ng. Lajuan Kovaleski NP-C    01/30/2021, 4:37 PM [address] Office [phone] Fax [phone]  Notice: This dictation was prepared with Dragon dictation along with smaller phrase technology. Any transcriptional errors that result from this process are unintentional and may not be corrected upon review.  I spent 15 minutes examining this patient, reviewing medications, and using patient centered shared decision making involving her cardiac care.  Prior to her visit I spent greater than 20 minutes reviewing her past medical history,  medications, and prior cardiac tests.

## 2021-01-29 NOTE — Progress Notes (Signed)
[place] Consult Note Telephone: [phone]  Fax: [phone]  PATIENT NAME: Louis Reid DOB: Nov 07, 1932 MRN: [id number]  PRIMARY CARE PROVIDER:   [name], MD [name], MD [address] [place],  [place] [address]  REFERRING PROVIDER: [name], MD [name], MD [place],  [place] [address]  RESPONSIBLE PARTY: Self [place]     Name Relation Home Work [address]   [place], [place] [phone]  [phone]     TELEHEALTH VISIT STATEMENT Due to the COVID-19 crisis, this visit was done via telemedicine and it was initiated and consent by this patient and or family. Video-audio (telehealth) contact was unable to be done due to technical barriers from the patient's side.   Visit is to build trust and highlight Palliative Medicine as specialized medical care for people living with serious illness, aimed at facilitating better quality of life through symptoms relief, assisting with advance care planning and complex medical decision making. This is a follow up visit.  RECOMMENDATIONS/PLAN:   Advance Care Planning/Code Status: Patient is a full code  Goals of Care: Goals of care include to maximize quality of life and symptom management.  Patient reports his decision to move to assisted living because it is getting increasingly difficult for him to be by himself; he desires to be in such a facility that we will provide him with some services.  He reported their facility arranged wanted him to moving his furniture.  Therapeutic listening and validation provided on his decision to move to assisted living.  Visit consisted of counseling and education dealing with the complex and emotionally intense issues of symptom management and palliative care in the setting of serious and potentially life-threatening illness. Palliative care team will continue  to support patient, patient's family, and medical team.  Symptom management/Plan:  Systolic CHF: Patient reports increasing weakness and desires to move to assisted living to receive needed assistance.  He reports follow-up appointment with his cardiologist tomorrow.  He is compliant with his diuretic, no added salt, elevation of BLE.  He denies chest pain, difficulty breathing or edema at this time; endorses chronic shortness of breath on moderate exertion. Hypertension: Managed with losartan. Follow up: Palliative care will continue to follow for complex medical decision making, advance care planning, and clarification of goals. Return 6 weeks or prn. Encouraged to call provider sooner with any concerns.  CHIEF COMPLAINT: Palliative follow up  HISTORY OF PRESENT ILLNESS:  Louis Reid a 87 y.o. male with multiple medical problems including chronic systolic congestive heart failure, weakness, CAD, cardiomyopathy, unstable angina.  Patient has ICD, last check showed normal device function 09/17/2020. History obtained from review of EMR, discussion with primary team, family and/or patient. Records reviewed and summarized above. All 10 point systems reviewed and are negative except as documented in history of present illness above  Review and summarization of Epic records shows history from other than patient.   Palliative Care was asked to follow this patient o help address complex decision making in the context of advance care planning and goals of care clarification.    PERTINENT MEDICATIONS:  Outpatient Encounter Medications as of 01/29/2021  Medication Sig   apixaban (ELIQUIS) 2.5 MG TABS tablet Take 1 tablet (2.5 mg total) by mouth 2 (two) times daily.   cholecalciferol (VITAMIN D) 1000 units tablet Take 1,000 Units by mouth daily.   cyanocobalamin 1000 MCG tablet Take  2,000 mcg by mouth daily.    docusate sodium (COLACE) 100 MG capsule Take 100 mg by mouth 2 (two) times daily.    empagliflozin (JARDIANCE) 10 MG TABS tablet Take 1 tablet (10 mg total) by mouth daily before breakfast.   feeding supplement, ENSURE ENLIVE, (ENSURE ENLIVE) LIQD Take 237 mLs by mouth 3 (three) times daily between meals.   Ferrous Sulfate (IRON) 325 (65 Fe) MG TABS Take by mouth. Take 1 tablet by mouth daily   finasteride (PROSCAR) 5 MG tablet Take 5 mg by mouth daily.   furosemide (LASIX) 20 MG tablet TAKE 1 TABLET BY MOUTH EVERY DAY   losartan (COZAAR) 25 MG tablet TAKE 0.5 TABLETS (12.5 MG TOTAL) BY MOUTH AT BEDTIME. NEEDS APPT FOR FURTHER REFILLS   Multiple Vitamins-Minerals (ICAPS AREDS 2 PO) Take 1 tablet by mouth 2 (two) times daily.    nitroGLYCERIN (NITROSTAT) 0.4 MG SL tablet Place 1 tablet (0.4 mg total) under the tongue every 5 (five) minutes x 3 doses as needed for chest pain. Take only as needed for chest pain if your blood pressure is greater than 100/80.   rosuvastatin (CRESTOR) 20 MG tablet Take 1 tablet (20 mg total) by mouth daily.   spironolactone (ALDACTONE) 25 MG tablet Take 0.5 tablets (12.5 mg total) by mouth daily.   No facility-administered encounter medications on file as of 01/29/2021.    HOSPICE ELIGIBILITY/DIAGNOSIS: TBD  PAST MEDICAL HISTORY:  Past Medical History:  Diagnosis Date   AICD (automatic cardioverter/defibrillator) present 09/13/2012   Anemia    CAD (coronary artery disease)    a. s/p remote CABG, b. AL STEMI c/b VF arrest in 05/2012 => LHC 05/10/12: Severe 3v CAD, S-OM1/2 ok, SVG-Dx ok, SVG-RCA occluded, LIMA-LAD atretic, proximal LAD occluded. EF 20% with anterior apical HK=> salvage PCI with POBA and thrombectomy of the occluded LAD;  c. 01/2017: atretic LIMA_LAD and known occluded SVG-RCA. Patent LAD stent and 70% proxRCA stenosis (FFR 0.88).    Chronic systolic heart failure ([place])    a. Echo 12/13: EF 20-25%, anteroseptal, inferior, apical HK, mildly reduced RV function, trivial pericardial effusion;   b.  Echo 3/14:  Ant, septal, apical AK, EF  25%   Clotting disorder ([place])    Depression    "years ago" (02/28/2018)   History of kidney stones    HLD (hyperlipidemia)    Hypertension    "I've never had high blood pressure; on RX for other reasons" (02/28/2018)   Inguinal hernia    "have one now on my left side" (09/13/2012); (02/28/2018)   Ischemic cardiomyopathy    Myocardial infarction [facility]) 1996;  05/2012   Osteoporosis    Paroxysmal atrial fibrillation ([place]) 03/17/2016   noted on device interrogation,  will follow burden   Pectus excavatum    Pulmonary embolism ([place])    a. after adhesiolysis for SBO in 04/2012 => coumadin for 6 mos   Renal cyst    SBO (small bowel obstruction) [facility]) November 2013   s/p lysis of adhesions   Sinoatrial node dysfunction ([place])    Skin cancer    "left ear; face" (02/28/2018)   Ventricular fibrillation ([place]) 05/2012   . in setting of AL STEMI 12/13 => EF 20-25% => d/c on LifeVest (repeat echo planned in 08/2012)     Review lab tests/diagnostics Recent Labs  Lab 01/27/21 1140  WBC 5.0  HGB 13.9  HCT 42.1  PLT 121*  MCV 94   Recent Labs  Lab 01/27/21 1140  NA  145*  K 4.7  CL 106  CO2 22  BUN 28*  CREATININE 1.66*  GLUCOSE 82   Estimated Creatinine Clearance: 26 mL/min (A) (by C-G formula based on SCr of 1.66 mg/dL (H)).  ALLERGIES: No Known Allergies    I spent 25 minutes providing this consultation; this includes time spent with patient/family, chart review and documentation. More than 50% of the time in this consultation was spent on counseling and coordinating communication   Thank you for the opportunity to participate in the care of Louis Reid Please call our office at [phone] if we can be of additional assistance.  Note: Portions of this note were generated with Lobbyist. Dictation errors may occur despite best attempts at proofreading.  Teodoro Spray, NP

## 2021-01-30 ENCOUNTER — Ambulatory Visit: Payer: Medicare Other | Admitting: General Practice

## 2021-01-30 ENCOUNTER — Encounter: Payer: Self-pay | Admitting: General Practice

## 2021-01-30 ENCOUNTER — Other Ambulatory Visit: Payer: Self-pay

## 2021-01-30 VITALS — BP 120/70 | HR 84 | Ht 71.0 in | Wt 126.4 lb

## 2021-01-30 DIAGNOSIS — Z95810 Presence of automatic (implantable) cardiac defibrillator: Secondary | ICD-10-CM

## 2021-01-30 DIAGNOSIS — E785 Hyperlipidemia, unspecified: Secondary | ICD-10-CM | POA: Diagnosis not present

## 2021-01-30 DIAGNOSIS — I480 Paroxysmal atrial fibrillation: Secondary | ICD-10-CM

## 2021-01-30 DIAGNOSIS — I5022 Chronic systolic (congestive) heart failure: Secondary | ICD-10-CM

## 2021-01-30 DIAGNOSIS — I2510 Atherosclerotic heart disease of native coronary artery without angina pectoris: Secondary | ICD-10-CM | POA: Diagnosis not present

## 2021-01-30 NOTE — Patient Instructions (Signed)
Medication Instructions:  The current medical regimen is effective;  continue present plan and medications as directed. Please refer to the Current Medication list given to you today.  *If you need a refill on your cardiac medications before your next appointment, please call your pharmacy*  Lab Work: PLEASE GET YOUR LAB DONE THE END OF NEXT WEEK-THIS IS NOT FASTING.  Special Instructions EAT SMALLER MORE FREQUENT MEALS MORE OFTEN THRU THE DAY  FOR HYDRATION-SMALLER AMOUNTS OF LIQUID MORE FREQUENTLY THRU THE DAY  PACE YOURSELF WITH YOUR PHYSICAL ACTIVITY  Follow-Up: Your next appointment:  KEEP SCHEDULED APPOINTMENT In Person with Kirk Ruths, MD  At [facility], you and your health needs are our priority.  As part of our continuing mission to provide you with exceptional heart care, we have created designated Provider Care Teams.  These Care Teams include your primary Cardiologist (physician) and Advanced Practice Providers (APPs -  Physician Assistants and Nurse Practitioners) who all work together to provide you with the care you need, when you need it.  We recommend signing up for the patient portal called "MyChart".  Sign up information is provided on this After Visit Summary.  MyChart is used to connect with patients for Virtual Visits (Telemedicine).  Patients are able to view lab/test results, encounter notes, upcoming appointments, etc.  Non-urgent messages can be sent to your provider as well.   To learn more about what you can do with MyChart, go to [email or web address].

## 2021-02-03 ENCOUNTER — Encounter: Payer: Medicare Other | Admitting: Internal Medicine

## 2021-02-03 ENCOUNTER — Other Ambulatory Visit: Payer: Self-pay

## 2021-02-03 DIAGNOSIS — I255 Ischemic cardiomyopathy: Secondary | ICD-10-CM

## 2021-02-03 MED ORDER — EMPAGLIFLOZIN 10 MG PO TABS: 10.0000 mg | ORAL_TABLET | Freq: Every day | ORAL | 3 refills | Status: DC

## 2021-02-04 ENCOUNTER — Other Ambulatory Visit: Payer: Self-pay

## 2021-02-08 LAB — BASIC METABOLIC PANEL
BUN/Creatinine Ratio: 16 (ref 10–24)
BUN: 27 mg/dL (ref 8–27)
CO2: 22 mmol/L (ref 20–29)
Calcium: 9.4 mg/dL (ref 8.6–10.2)
Chloride: 105 mmol/L (ref 96–106)
Creatinine, Ser: 1.7 mg/dL — ABNORMAL HIGH (ref 0.76–1.27)
Glucose: 85 mg/dL (ref 65–99)
Potassium: 4.8 mmol/L (ref 3.5–5.2)
Sodium: 142 mmol/L (ref 134–144)
eGFR: 39 mL/min/{1.73_m2} — ABNORMAL LOW (ref >59)

## 2021-02-11 ENCOUNTER — Encounter: Payer: Self-pay | Admitting: *Deleted

## 2021-02-11 ENCOUNTER — Telehealth: Payer: Self-pay

## 2021-02-11 NOTE — Telephone Encounter (Signed)
02/11/21 '@3'$ :15PM: Palliative care SW returned patients son, [name],  [place]  Son shared that patient will be moving to [place] in [place] before the end of the month and inquired about meals being delivered to patient in the meantime as well as a rollator being ordered.   SW informed son that [facility] has a waiting list at this time and may not be able to provide meals to patient prior to his transition to [place]. SW encouraged son to purchase frozen meals for patient if feasible until his moved date. Son shared that he would do this.   SW discussed medicare coverage of RW and rollator. Son shared that he believes patient may have a RW paid for by Medicare already and will inquire with patient, if so they will purchase a rollator after patient transitions to ALF.   Son had no further questions/concerns. for SW at this time.

## 2021-02-13 ENCOUNTER — Other Ambulatory Visit: Payer: Medicare Other | Admitting: Hospice

## 2021-02-13 ENCOUNTER — Other Ambulatory Visit: Payer: Self-pay

## 2021-02-13 DIAGNOSIS — Z515 Encounter for palliative care: Secondary | ICD-10-CM

## 2021-02-13 DIAGNOSIS — I5022 Chronic systolic (congestive) heart failure: Secondary | ICD-10-CM

## 2021-02-13 DIAGNOSIS — I110 Hypertensive heart disease with heart failure: Secondary | ICD-10-CM

## 2021-02-13 NOTE — Progress Notes (Signed)
[place] Consult Note Telephone: [phone]  Fax: [phone]  PATIENT NAME: Louis Reid DOB: 11/24/1932 MRN: [id number]  PRIMARY CARE PROVIDER:   [name], MD [name], MD [address] [place],  [place] [address]  REFERRING PROVIDER: [name], MD [name], MD [address] [place],  [place] [address]  RESPONSIBLE PARTY:  Self Contact Information     Name Relation Home Work Mobile   Louis, [name] [phone]  [phone]     TELEHEALTH VISIT STATEMENT Due to the COVID-19 crisis, this visit was done via telemedicine and it was initiated and consent by this patient and or family. Video-audio (telehealth) contact was unable to be done due to technical barriers from the patient's side.   Visit is to build trust and highlight Palliative Medicine as specialized medical care for people living with serious illness, aimed at facilitating better quality of life through symptoms relief, assisting with advance care planning and complex medical decision making. This is a follow up visit.  RECOMMENDATIONS/PLAN:  Discussion on de-escalation of care from disease focused to comfort measures.  Goals of Care: Goals of care include to maximize quality of life and symptom management.  Patient requesting hospice service due to worsening decline in functional status.  Visit consisted of counseling and education dealing with the complex and emotionally intense issues of symptom management and palliative care in the setting of serious and potentially life-threatening illness. Palliative care team will continue to support patient, patient's family, and medical team. Reid spent 50 minutes providing this consultation; this includes time spent with patient/family. More than 50% of the time in this consultation was spent on counseling and coordinating communication   ---------------------------------------------------------------------------------------------------------- Symptom management/Plan:  CHF: Patient with decompensated combined systolic and diastolic CHF. Followed at CHF clinic; poor response to treatment with diuretic, vasodilator and ARB. Last echocardiogram August 2021 showed ejection fraction 20 to 25%.  In the last 4 to 6 weeks, he has worsening fatigue, weight loss, decreased appetite and worsening dyspnea on exertion. He is no longer able to carry out minimal physical activity without symptoms of HF.  He reports shortness of breath with air hunger on minimal exertion. He is cachetic, height 58fet 11 inches, weight 120 Ibs BMI 16.73. Patient and family requesting hospice service.  [facility]  Hospice referral initiated. Follow up: Palliative care will continue to follow for complex medical decision making, advance care planning, and clarification of goals. Return 6 weeks or prn. Encouraged to call provider sooner with any concerns.  CHIEF COMPLAINT: Palliative follow up  HISTORY OF PRESENT ILLNESS:  Louis Reid 87y.o. male with multiple medical problems including congestive heart failure, hypertensive heart disease with systolic CHF.  Patient reports overall decline in his functional status-increasing weakness/fatigue, increasing shortness of breath on minimal exertion, no longer able to go outside as before, decreasing appetite and increasing weight loss.  History obtained from review of EMR, discussion with primary team, family and/or patient. Records reviewed and summarized above. All 10 point systems reviewed and are negative except as documented in history of present illness above  Review and summarization of Epic records shows history from other than patient.   Palliative Care was asked to follow this patient o help address complex decision making in the context of advance care  planning and goals of care clarification.    PERTINENT MEDICATIONS:  Outpatient Encounter Medications  as of 02/13/2021  Medication Sig   apixaban (ELIQUIS) 2.5 MG TABS tablet Take 1 tablet (2.5 mg total) by mouth 2 (two) times daily.   cholecalciferol (VITAMIN D) 1000 units tablet Take 1,000 Units by mouth daily.   cyanocobalamin 1000 MCG tablet Take 2,000 mcg by mouth daily.    docusate sodium (COLACE) 100 MG capsule Take 100 mg by mouth 2 (two) times daily.   empagliflozin (JARDIANCE) 10 MG TABS tablet Take 1 tablet (10 mg total) by mouth daily before breakfast.   feeding supplement, ENSURE ENLIVE, (ENSURE ENLIVE) LIQD Take 237 mLs by mouth 3 (three) times daily between meals.   Ferrous Sulfate (IRON) 325 (65 Fe) MG TABS Take by mouth. Take 1 tablet by mouth daily   finasteride (PROSCAR) 5 MG tablet Take 5 mg by mouth daily.   furosemide (LASIX) 20 MG tablet TAKE 1 TABLET BY MOUTH EVERY DAY   losartan (COZAAR) 25 MG tablet TAKE 0.5 TABLETS (12.5 MG TOTAL) BY MOUTH AT BEDTIME. NEEDS APPT FOR FURTHER REFILLS   Multiple Vitamins-Minerals (ICAPS AREDS 2 PO) Take 1 tablet by mouth 2 (two) times daily.    nitroGLYCERIN (NITROSTAT) 0.4 MG SL tablet Place 1 tablet (0.4 mg total) under the tongue every 5 (five) minutes x 3 doses as needed for chest pain. Take only as needed for chest pain if your blood pressure is greater than 100/80.   rosuvastatin (CRESTOR) 20 MG tablet Take 1 tablet (20 mg total) by mouth daily.   spironolactone (ALDACTONE) 25 MG tablet Take 0.5 tablets (12.5 mg total) by mouth daily.   No facility-administered encounter medications on file as of 02/13/2021.    HOSPICE ELIGIBILITY/DIAGNOSIS: TBD  PAST MEDICAL HISTORY:  Past Medical History:  Diagnosis Date   AICD (automatic cardioverter/defibrillator) present 09/13/2012   Anemia    CAD (coronary artery disease)    a. s/p remote CABG, b. AL STEMI c/b VF arrest in 05/2012 => LHC 05/10/12: Severe 3v CAD, S-OM1/2 ok, SVG-Dx ok,  SVG-RCA occluded, LIMA-LAD atretic, proximal LAD occluded. EF 20% with anterior apical HK=> salvage PCI with POBA and thrombectomy of the occluded LAD;  c. 01/2017: atretic LIMA_LAD and known occluded SVG-RCA. Patent LAD stent and 70% proxRCA stenosis (FFR 0.88).    Chronic systolic heart failure ([place])    a. Echo 12/13: EF 20-25%, anteroseptal, inferior, apical HK, mildly reduced RV function, trivial pericardial effusion;   b.  Echo 3/14:  Ant, septal, apical AK, EF 25%   Clotting disorder ([place])    Depression    "years ago" (02/28/2018)   History of kidney stones    HLD (hyperlipidemia)    Hypertension    "Reid've never had high blood pressure; on RX for other reasons" (02/28/2018)   Inguinal hernia    "have one now on my left side" (09/13/2012); (02/28/2018)   Ischemic cardiomyopathy    Myocardial infarction [facility]) 1996;  05/2012   Osteoporosis    Paroxysmal atrial fibrillation ([place]) 03/17/2016   noted on device interrogation,  will follow burden   Pectus excavatum    Pulmonary embolism ([place])    a. after adhesiolysis for SBO in 04/2012 => coumadin for 6 mos   Renal cyst    SBO (small bowel obstruction) [facility]) November 2013   s/p lysis of adhesions   Sinoatrial node dysfunction ([place])    Skin cancer    "left ear; face" (02/28/2018)   Ventricular fibrillation ([place]) 05/2012   . in setting of AL STEMI 12/13 => EF 20-25% =>  d/c on LifeVest (repeat echo planned in 08/2012)    Recent Labs  Lab 02/07/21 1227  NA 142  K 4.8  CL 105  CO2 22  BUN 27  CREATININE 1.70*  GLUCOSE 85    ALLERGIES: No Known Allergies    Thank you for the opportunity to participate in the care of Louis Reid Please call our office at [phone] if we can be of additional assistance.  Note: Portions of this note were generated with Lobbyist. Dictation errors may occur despite best attempts at proofreading.  Teodoro Spray, NP

## 2021-02-17 ENCOUNTER — Other Ambulatory Visit (HOSPITAL_COMMUNITY): Payer: Medicare Other

## 2021-02-18 ENCOUNTER — Emergency Department (HOSPITAL_COMMUNITY): Payer: Medicare Other

## 2021-02-18 ENCOUNTER — Inpatient Hospital Stay (HOSPITAL_COMMUNITY)
Admission: EM | Admit: 2021-02-18 | Discharge: 2021-02-25 | DRG: 640 | Disposition: A | Discharge status: Hospice | Payer: Medicare Other | Source: Skilled Nursing Facility | Attending: Internal Medicine | Admitting: Internal Medicine

## 2021-02-18 ENCOUNTER — Other Ambulatory Visit: Payer: Self-pay

## 2021-02-18 DIAGNOSIS — I5032 Chronic diastolic (congestive) heart failure: Secondary | ICD-10-CM | POA: Diagnosis present

## 2021-02-18 DIAGNOSIS — Z85828 Personal history of other malignant neoplasm of skin: Secondary | ICD-10-CM

## 2021-02-18 DIAGNOSIS — I255 Ischemic cardiomyopathy: Secondary | ICD-10-CM | POA: Diagnosis present

## 2021-02-18 DIAGNOSIS — Z20822 Contact with and (suspected) exposure to covid-19: Secondary | ICD-10-CM | POA: Diagnosis present

## 2021-02-18 DIAGNOSIS — Z7984 Long term (current) use of oral hypoglycemic drugs: Secondary | ICD-10-CM

## 2021-02-18 DIAGNOSIS — Z95810 Presence of automatic (implantable) cardiac defibrillator: Secondary | ICD-10-CM

## 2021-02-18 DIAGNOSIS — Z86711 Personal history of pulmonary embolism: Secondary | ICD-10-CM

## 2021-02-18 DIAGNOSIS — R627 Adult failure to thrive: Secondary | ICD-10-CM | POA: Diagnosis not present

## 2021-02-18 DIAGNOSIS — Z9079 Acquired absence of other genital organ(s): Secondary | ICD-10-CM

## 2021-02-18 DIAGNOSIS — E860 Dehydration: Secondary | ICD-10-CM | POA: Diagnosis not present

## 2021-02-18 DIAGNOSIS — E43 Unspecified severe protein-calorie malnutrition: Secondary | ICD-10-CM | POA: Insufficient documentation

## 2021-02-18 DIAGNOSIS — K5900 Constipation, unspecified: Secondary | ICD-10-CM | POA: Diagnosis present

## 2021-02-18 DIAGNOSIS — Z681 Body mass index (BMI) 19 or less, adult: Secondary | ICD-10-CM

## 2021-02-18 DIAGNOSIS — Z7901 Long term (current) use of anticoagulants: Secondary | ICD-10-CM

## 2021-02-18 DIAGNOSIS — N1831 Chronic kidney disease, stage 3a: Secondary | ICD-10-CM | POA: Diagnosis present

## 2021-02-18 DIAGNOSIS — Z955 Presence of coronary angioplasty implant and graft: Secondary | ICD-10-CM

## 2021-02-18 DIAGNOSIS — Q676 Pectus excavatum: Secondary | ICD-10-CM

## 2021-02-18 DIAGNOSIS — I252 Old myocardial infarction: Secondary | ICD-10-CM

## 2021-02-18 DIAGNOSIS — E785 Hyperlipidemia, unspecified: Secondary | ICD-10-CM | POA: Diagnosis present

## 2021-02-18 DIAGNOSIS — I480 Paroxysmal atrial fibrillation: Secondary | ICD-10-CM | POA: Diagnosis present

## 2021-02-18 DIAGNOSIS — Z87891 Personal history of nicotine dependence: Secondary | ICD-10-CM

## 2021-02-18 DIAGNOSIS — R300 Dysuria: Secondary | ICD-10-CM | POA: Diagnosis present

## 2021-02-18 DIAGNOSIS — I130 Hypertensive heart and chronic kidney disease with heart failure and stage 1 through stage 4 chronic kidney disease, or unspecified chronic kidney disease: Secondary | ICD-10-CM | POA: Diagnosis present

## 2021-02-18 DIAGNOSIS — I2510 Atherosclerotic heart disease of native coronary artery without angina pectoris: Secondary | ICD-10-CM | POA: Diagnosis present

## 2021-02-18 DIAGNOSIS — Z66 Do not resuscitate: Secondary | ICD-10-CM | POA: Diagnosis present

## 2021-02-18 DIAGNOSIS — I959 Hypotension, unspecified: Secondary | ICD-10-CM | POA: Diagnosis present

## 2021-02-18 DIAGNOSIS — Z8249 Family history of ischemic heart disease and other diseases of the circulatory system: Secondary | ICD-10-CM

## 2021-02-18 DIAGNOSIS — I495 Sick sinus syndrome: Secondary | ICD-10-CM | POA: Diagnosis present

## 2021-02-18 DIAGNOSIS — R531 Weakness: Principal | ICD-10-CM

## 2021-02-18 DIAGNOSIS — N179 Acute kidney failure, unspecified: Secondary | ICD-10-CM | POA: Diagnosis present

## 2021-02-18 DIAGNOSIS — Z8674 Personal history of sudden cardiac arrest: Secondary | ICD-10-CM

## 2021-02-18 DIAGNOSIS — N400 Enlarged prostate without lower urinary tract symptoms: Secondary | ICD-10-CM | POA: Diagnosis present

## 2021-02-18 DIAGNOSIS — Z79899 Other long term (current) drug therapy: Secondary | ICD-10-CM

## 2021-02-18 DIAGNOSIS — D631 Anemia in chronic kidney disease: Secondary | ICD-10-CM | POA: Diagnosis present

## 2021-02-18 DIAGNOSIS — Z951 Presence of aortocoronary bypass graft: Secondary | ICD-10-CM

## 2021-02-18 DIAGNOSIS — I5084 End stage heart failure: Secondary | ICD-10-CM | POA: Diagnosis present

## 2021-02-18 LAB — URINALYSIS, ROUTINE W REFLEX MICROSCOPIC
Bacteria, UA: NONE SEEN
Bilirubin Urine: NEGATIVE
Glucose, UA: >=500 mg/dL — AB
Ketones, ur: 5 mg/dL — AB
Leukocytes,Ua: NEGATIVE
Nitrite: NEGATIVE
Protein, ur: 30 mg/dL — AB
Specific Gravity, Urine: 1.012 (ref 1.005–1.030)
pH: 5 (ref 5.0–8.0)

## 2021-02-18 LAB — CBC WITH DIFFERENTIAL/PLATELET
Abs Immature Granulocytes: 0.02 10*3/uL (ref 0.00–0.07)
Basophils Absolute: 0 10*3/uL (ref 0.0–0.1)
Basophils Relative: 0 %
Eosinophils Absolute: 0 10*3/uL (ref 0.0–0.5)
Eosinophils Relative: 0 %
HCT: 44.6 % (ref 39.0–52.0)
Hemoglobin: 14.7 g/dL (ref 13.0–17.0)
Immature Granulocytes: 0 %
Lymphocytes Relative: 16 %
Lymphs Abs: 1.1 10*3/uL (ref 0.7–4.0)
MCH: 31.6 pg (ref 26.0–34.0)
MCHC: 33 g/dL (ref 30.0–36.0)
MCV: 95.9 fL (ref 80.0–100.0)
Monocytes Absolute: 0.5 10*3/uL (ref 0.1–1.0)
Monocytes Relative: 7 %
Neutro Abs: 5.3 10*3/uL (ref 1.7–7.7)
Neutrophils Relative %: 77 %
Platelets: 132 10*3/uL — ABNORMAL LOW (ref 150–400)
RBC: 4.65 MIL/uL (ref 4.22–5.81)
RDW: 14.6 % (ref 11.5–15.5)
WBC: 7 10*3/uL (ref 4.0–10.5)
nRBC: 0 % (ref 0.0–0.2)

## 2021-02-18 LAB — COMPREHENSIVE METABOLIC PANEL
ALT: 22 U/L (ref 0–44)
AST: 26 U/L (ref 15–41)
Albumin: 3.5 g/dL (ref 3.5–5.0)
Alkaline Phosphatase: 53 U/L (ref 38–126)
Anion gap: 11 (ref 5–15)
BUN: 34 mg/dL — ABNORMAL HIGH (ref 8–23)
CO2: 20 mmol/L — ABNORMAL LOW (ref 22–32)
Calcium: 9.6 mg/dL (ref 8.9–10.3)
Chloride: 111 mmol/L (ref 98–111)
Creatinine, Ser: 1.91 mg/dL — ABNORMAL HIGH (ref 0.61–1.24)
GFR, Estimated: 34 mL/min — ABNORMAL LOW (ref >60)
Glucose, Bld: 85 mg/dL (ref 70–99)
Potassium: 4.3 mmol/L (ref 3.5–5.1)
Sodium: 142 mmol/L (ref 135–145)
Total Bilirubin: 1.9 mg/dL — ABNORMAL HIGH (ref 0.3–1.2)
Total Protein: 5.7 g/dL — ABNORMAL LOW (ref 6.5–8.1)

## 2021-02-18 LAB — TROPONIN I (HIGH SENSITIVITY)
Troponin I (High Sensitivity): 30 ng/L — ABNORMAL HIGH (ref <18)
Troponin I (High Sensitivity): 30 ng/L — ABNORMAL HIGH (ref <18)

## 2021-02-18 LAB — LIPASE, BLOOD: Lipase: 58 U/L — ABNORMAL HIGH (ref 11–51)

## 2021-02-18 MED ORDER — APIXABAN 2.5 MG PO TABS
2.5000 mg | ORAL_TABLET | Freq: Two times a day (BID) | ORAL | Status: DC
  Administered 2021-02-18 – 2021-02-25 (×14): 2.5 mg via ORAL
  Filled 2021-02-18 (×17): qty 1

## 2021-02-18 MED ORDER — ENSURE ENLIVE PO LIQD
237.0000 mL | Freq: Three times a day (TID) | ORAL | Status: DC
  Administered 2021-02-19 – 2021-02-25 (×15): 237 mL via ORAL
  Filled 2021-02-18 (×2): qty 237

## 2021-02-18 MED ORDER — NITROGLYCERIN 0.4 MG SL SUBL: 0.4000 mg | SUBLINGUAL_TABLET | SUBLINGUAL | Status: DC | PRN

## 2021-02-18 MED ORDER — FUROSEMIDE 20 MG PO TABS
10.0000 mg | ORAL_TABLET | Freq: Every day | ORAL | Status: DC
  Administered 2021-02-18 – 2021-02-25 (×8): 10 mg via ORAL
  Filled 2021-02-18 (×8): qty 1

## 2021-02-18 MED ORDER — ACETAMINOPHEN 650 MG RE SUPP: 650.0000 mg | Freq: Four times a day (QID) | RECTAL | Status: DC | PRN

## 2021-02-18 MED ORDER — SPIRONOLACTONE 12.5 MG HALF TABLET
12.5000 mg | ORAL_TABLET | Freq: Every day | ORAL | Status: DC
  Filled 2021-02-18: qty 1

## 2021-02-18 MED ORDER — BISACODYL 5 MG PO TBEC: 5.0000 mg | DELAYED_RELEASE_TABLET | Freq: Every day | ORAL | Status: DC | PRN

## 2021-02-18 MED ORDER — SODIUM CHLORIDE 0.9 % IV BOLUS
250.0000 mL | Freq: Once | INTRAVENOUS | Status: AC
  Administered 2021-02-18: 250 mL via INTRAVENOUS

## 2021-02-18 MED ORDER — LOSARTAN POTASSIUM 25 MG PO TABS
12.5000 mg | ORAL_TABLET | Freq: Every day | ORAL | Status: DC
  Administered 2021-02-18: 12.5 mg via ORAL
  Filled 2021-02-18: qty 0.5

## 2021-02-18 MED ORDER — ACETAMINOPHEN 325 MG PO TABS
650.0000 mg | ORAL_TABLET | Freq: Four times a day (QID) | ORAL | Status: DC | PRN
  Administered 2021-02-20 – 2021-02-22 (×2): 650 mg via ORAL
  Filled 2021-02-18 (×2): qty 2

## 2021-02-18 MED ORDER — FINASTERIDE 5 MG PO TABS
5.0000 mg | ORAL_TABLET | Freq: Every day | ORAL | Status: DC
  Administered 2021-02-19 – 2021-02-25 (×7): 5 mg via ORAL
  Filled 2021-02-18 (×7): qty 1

## 2021-02-18 NOTE — ED Triage Notes (Signed)
PT BIB EMS due to weakness that has been going on for 2 weeks and getting progressively worse. Pt lives in independent living at [place]. Pt is a hospice pt with [facility]. Pt is axox4. VSS.

## 2021-02-18 NOTE — Progress Notes (Signed)
CSW has been on hold with Nanine Means and unable to get through to anyone.

## 2021-02-18 NOTE — Progress Notes (Signed)
CSW spoke with [place] the RN at [facility] who confirmed patient is currently in their assisted living program.

## 2021-02-18 NOTE — ED Provider Notes (Signed)
  4:22 PM Patient signed out to me by previous ED physician.  87 yo male, palliative care, presenting for sob and generalized weakness. No hypoxia. Hospice consulted for FTT. PT consult.   Pt evaluated and recommended for admission for failure to thrive while awaiting transition to hospice care and for physical therapy. I spoke with hospitalist who agrees to accept patient. Pt agreeable to plan.    Lianne Cure, DO [phone]

## 2021-02-18 NOTE — Progress Notes (Signed)
Manufacturing engineer [facility]) Community Based Palliative Care       This patient is currently followed by palliative care services in the community.  Pt was recently referred by [facility] palliative NP for hospice services; pt is pending transition to hospice at this time.  [facility] will continue to follow for any discharge planning needs and to coordinate continuation of palliative care or admission to hospice services as appropriate.   Thank you for the opportunity to participate in this patient's care.     Domenic Moras, BSN, RN [facility] Liaison [phone] (24h on call)

## 2021-02-18 NOTE — H&P (Signed)
History and Physical    Louis Reid [id number] DOB: 1933/01/10 DOA: 02/18/2021 PCP: [name], MD  Chief Complaint: weakness Historian: patient HPI:  Louis Reid is a 87 y.o. male with a PMH significant for end stage heart failure, HLD, CAD, HTN, anemia, h/o PE, ICD in place. They presented from ALF to the ED on 02/18/2021 with worsened generalized weakness x2 weeks that surpasses the care at ALF. He has had lack of appetite for a long time with severe weight loss. He is very pleasant but has several concerns currently. His right lower ribs hurt from the bed and is finding it hard to get in a comfortable position, requests a pillow. He would also like to be untangled from the telemetry wires, and describes burning urination starting today, and he would like some ice water. He has a h/o enlarged prostate but has not had problems with urine initiation until today. He currently takes finasteride and lasix. In the ED, it was found that they had failure to thrive due to worsening of chronic disease state and inability to tolerate PO foods and elevated glucose in urine. He is otherwise stable. Troponins stable at 30 x2.  They were treated with pillow, discontinue continuous telemetry, PO intake, flomax. Palliative is already following and recommended PT consult which was done in ED.   Patient was admitted to medicine service for further workup and management of failure to thrive as outlined in detail below.  Review of Systems: As per HPI otherwise 10 point review of systems negative.   Past Medical History:  Diagnosis Date   AICD (automatic cardioverter/defibrillator) present 09/13/2012   Anemia    CAD (coronary artery disease)    a. s/p remote CABG, b. AL STEMI c/b VF arrest in 05/2012 => LHC 05/10/12: Severe 3v CAD, S-OM1/2 ok, SVG-Dx ok, SVG-RCA occluded, LIMA-LAD atretic, proximal LAD occluded. EF 20% with anterior apical HK=> salvage PCI with POBA and thrombectomy of the  occluded LAD;  c. 01/2017: atretic LIMA_LAD and known occluded SVG-RCA. Patent LAD stent and 70% proxRCA stenosis (FFR 0.88).    Chronic systolic heart failure ([place])    a. Echo 12/13: EF 20-25%, anteroseptal, inferior, apical HK, mildly reduced RV function, trivial pericardial effusion;   b.  Echo 3/14:  Ant, septal, apical AK, EF 25%   Clotting disorder ([place])    Depression    "years ago" (02/28/2018)   History of kidney stones    HLD (hyperlipidemia)    Hypertension    "I've never had high blood pressure; on RX for other reasons" (02/28/2018)   Inguinal hernia    "have one now on my left side" (09/13/2012); (02/28/2018)   Ischemic cardiomyopathy    Myocardial infarction [facility]) 1996;  05/2012   Osteoporosis    Paroxysmal atrial fibrillation ([place]) 03/17/2016   noted on device interrogation,  will follow burden   Pectus excavatum    Pulmonary embolism ([place])    a. after adhesiolysis for SBO in 04/2012 => coumadin for 6 mos   Renal cyst    SBO (small bowel obstruction) [facility]) November 2013   s/p lysis of adhesions   Sinoatrial node dysfunction ([place])    Skin cancer    "left ear; face" (02/28/2018)   Ventricular fibrillation ([place]) 05/2012   . in setting of AL STEMI 12/13 => EF 20-25% => d/c on LifeVest (repeat echo planned in 08/2012)    Past Surgical History:  Procedure Laterality Date   CATARACT EXTRACTION W/ INTRAOCULAR LENS  IMPLANT, BILATERAL Bilateral    COLONOSCOPY     CORONARY ANGIOPLASTY WITH STENT PLACEMENT     "I've got 2 or 3 stents" (02/28/2018)   CORONARY ARTERY BYPASS GRAFT  1996   "CABG X5" (09/13/2012)   CYSTOSCOPY/RETROGRADE/URETEROSCOPY/STONE EXTRACTION WITH BASKET  1990's   ESOPHAGOGASTRODUODENOSCOPY  05/08/2012   Procedure: ESOPHAGOGASTRODUODENOSCOPY (EGD);  Surgeon: Juanita Craver, MD;  Location: [facility] ENDOSCOPY;  Service: Endoscopy;  Laterality: N/A;  Nevin Bloodgood put on for Dr. Collene Mares , Dr. Collene Mares will call if she wants another time   IMPLANTABLE CARDIOVERTER DEFIBRILLATOR IMPLANT N/A  09/13/2012   SJM Ellipse ER implanted by Dr Rayann Heman for secondary prevention   INGUINAL HERNIA REPAIR Right 1996   INTRAVASCULAR PRESSURE WIRE/FFR STUDY N/A 01/20/2017   Procedure: INTRAVASCULAR PRESSURE WIRE/FFR STUDY;  Surgeon: Wellington Hampshire, MD;  Location: [place] CV LAB;  Service: Cardiovascular;  Laterality: N/A;   LAPAROTOMY  04/27/2012   Procedure: EXPLORATORY LAPAROTOMY;  Surgeon: Gwenyth Ober, MD;  Location: [place];  Service: General;  Laterality: N/A;   LEFT HEART CATH AND CORS/GRAFTS ANGIOGRAPHY N/A 01/20/2017   Procedure: LEFT HEART CATH AND CORS/GRAFTS ANGIOGRAPHY;  Surgeon: Wellington Hampshire, MD;  Location: [place] CV LAB;  Service: Cardiovascular;  Laterality: N/A;   LEFT HEART CATHETERIZATION WITH CORONARY ANGIOGRAM N/A 05/10/2012   Procedure: LEFT HEART CATHETERIZATION WITH CORONARY ANGIOGRAM;  Surgeon: Burnell Blanks, MD;  Location: [facility] CATH LAB;  Service: Cardiovascular;  Laterality: N/A;   PTCA     percutaneous transluminal coronary intervention and brachy therapy, Bruce R. Olevia Perches, MD. EF 60%   RIGHT HEART CATH N/A 03/01/2018   Procedure: RIGHT HEART CATH;  Surgeon: Jolaine Artist, MD;  Location: [place] CV LAB;  Service: Cardiovascular;  Laterality: N/A;   SKIN CANCER EXCISION     "left ear; face" (02/28/2018)   TRANSURETHRAL RESECTION OF PROSTATE       reports that he has quit smoking. His smoking use included cigarettes. He has never used smokeless tobacco. He reports that he does not drink alcohol and does not use drugs.  No Known Allergies  Family History  Problem Relation Age of Onset   Heart disease Father    Other Mother        brain tumor   Colon cancer Brother    Cancer Brother        Liver   Heart disease Brother     Prior to Admission medications   Medication Sig Start Date End Date Taking? Authorizing Provider  apixaban (ELIQUIS) 2.5 MG TABS tablet Take 1 tablet (2.5 mg total) by mouth 2 (two) times daily. 07/09/20  Yes  Bensimhon, Shaune Pascal, MD  cholecalciferol (VITAMIN D) 1000 units tablet Take 2,000 Units by mouth daily.   Yes [provider]  docusate sodium (COLACE) 100 MG capsule Take 100 mg by mouth 2 (two) times daily.   Yes [provider]  empagliflozin (JARDIANCE) 10 MG TABS tablet Take 1 tablet (10 mg total) by mouth daily before breakfast. 02/03/21  Yes Crenshaw, Denice Bors, MD  Ferrous Sulfate (IRON) 325 (65 Fe) MG TABS Take 65 mg by mouth at bedtime.   Yes [provider]  finasteride (PROSCAR) 5 MG tablet Take 5 mg by mouth daily. 08/30/19  Yes [provider]  furosemide (LASIX) 20 MG tablet TAKE 1 TABLET BY MOUTH EVERY DAY Patient taking differently: Take 10 mg by mouth daily. 01/20/21  Yes Lelon Perla, MD  losartan (COZAAR) 25 MG tablet TAKE 0.5  TABLETS (12.5 MG TOTAL) BY MOUTH AT BEDTIME. NEEDS APPT FOR FURTHER REFILLS Patient taking differently: Take 12.5 mg by mouth at bedtime. 12/25/20  Yes Bensimhon, Shaune Pascal, MD  nitroGLYCERIN (NITROSTAT) 0.4 MG SL tablet Place 1 tablet (0.4 mg total) under the tongue every 5 (five) minutes x 3 doses as needed for chest pain. Take only as needed for chest pain if your blood pressure is greater than 100/80. 01/22/17  Yes Carlota Raspberry, Tiffany, [name]  rosuvastatin (CRESTOR) 20 MG tablet Take 1 tablet (20 mg total) by mouth daily. 04/08/20 02/18/21 Yes Lelon Perla, MD  spironolactone (ALDACTONE) 25 MG tablet Take 0.5 tablets (12.5 mg total) by mouth daily. 01/27/21 04/27/21 Yes Lelon Perla, MD  feeding supplement, ENSURE ENLIVE, (ENSURE ENLIVE) LIQD Take 237 mLs by mouth 3 (three) times daily between meals. Patient not taking: No sig reported 03/03/18   Georgiana Shore, NP   I have personally, briefly reviewed patient's prior medical records in [place]  Physical Exam: Vitals:   02/18/21 2000 02/18/21 2015 02/18/21 2030 02/18/21 2045  BP: (!) 85/66 (!) 101/58  107/66  Pulse: 74 100 73 64  Resp: 16 (!) '23 17 16   '$ Temp:      TempSrc:      SpO2: 98% 100% 100% 99%   Constitutional: NAD, calm, comfortable HEENT: lids and conjunctivae normal Mucous membranes are dry. Posterior pharynx clear of any exudate or lesions.Normal dentition.  Neck: normal, supple, no masses, no thyromegaly Respiratory: CTAB, no wheezing, no crackles. Normal respiratory effort.  Cardiovascular: RRR. No extremity edema. 2+ pedal pulses. no clubbing / cyanosis. Severe pectus excavatum  Abdomen: soft, scaphoid  Musculoskeletal: No joint deformity upper and lower extremities. Muscle wasting diffusely  Skin: dry, intact, normal color, normal temperature Neurologic: Alert and oriented x 2. Thought it was breakfast time. Normal speech.  Grossly non-focal exam. Psychiatric: Normal mood. Congruent affect.  Labs on Admission: I have personally reviewed following labs and imaging studies  CBC: Recent Labs  Lab 02/18/21 1214  WBC 7.0  NEUTROABS 5.3  HGB 14.7  HCT 44.6  MCV 95.9  PLT [id number]*   Basic Metabolic Panel: Recent Labs  Lab 02/18/21 1214  NA 142  K 4.3  CL 111  CO2 20*  GLUCOSE 85  BUN 34*  CREATININE 1.91*  CALCIUM 9.6   GFR: CrCl cannot be calculated (Unknown ideal weight.). Liver Function Tests: Recent Labs  Lab 02/18/21 1214  AST 26  ALT 22  ALKPHOS 53  BILITOT 1.9*  PROT 5.7*  ALBUMIN 3.5   Recent Labs  Lab 02/18/21 1214  LIPASE 58*   Urine analysis:    Component Value Date/Time   COLORURINE YELLOW 02/18/2021 2010   APPEARANCEUR CLEAR 02/18/2021 2010   LABSPEC 1.012 02/18/2021 2010   PHURINE 5.0 02/18/2021 2010   GLUCOSEU >=500 (A) 02/18/2021 2010   HGBUR SMALL (A) 02/18/2021 2010   BILIRUBINUR NEGATIVE 02/18/2021 2010   KETONESUR 5 (A) 02/18/2021 2010   PROTEINUR 30 (A) 02/18/2021 2010   UROBILINOGEN 1.0 05/24/2012 1330   NITRITE NEGATIVE 02/18/2021 2010   LEUKOCYTESUR NEGATIVE 02/18/2021 2010    Radiological Exams on Admission: DG Chest Port 1 View  Result Date:  02/18/2021 CLINICAL DATA:  Shortness of breath. EXAM: PORTABLE CHEST 1 VIEW COMPARISON:  February 28, 2018. FINDINGS: The heart size and mediastinal contours are within normal limits. Status post coronary bypass graft. Left-sided defibrillator is unchanged in position. Both lungs are clear. The visualized skeletal structures are unremarkable.  IMPRESSION: No active disease. Electronically Signed   By: Marijo Conception M.D.   On: 02/18/2021 12:14    EKG: Independently reviewed. Low volume, NSR with PACs- similar compared to previous.  Assessment/Plan Active Problems:   Failure to thrive in adult   Failure to thrive- unable to tolerate PO. Symptomatic heart failure at rest. Already in discussions with palliative for transitioning to hospice residential care and possible comfort care measures. He is unable to return to his ALF at current state and requires higher level of assistance currently. At this time, patient wishes to continue his regular medications. He wishes to proceed with hospice.  - regular diet  - ensure supplements - discontinue telemetry - f/u palliative - deescalate medications as able - PT/OT consulted - limit labs   HFrEF- hypovolemic on exam, no congestion on chest xray. Patient wishes to continue his home medications - continue lasix '10mg'$ , losartan 12.'5mg'$ , spironalactone 12.'5mg'$   Dysuria- negative for overt infection on urinalysis or retention. Consider glucosuria or worsened BPH.  - continue finasteride - encourage PO hydration - consider antibiotic for continued dysuria  DVT prophylaxis: apixaban (ELIQUIS) tablet 2.5 mg Start: 02/18/21 2200 apixaban (ELIQUIS) tablet 2.5 mg   Code Status: DNR  Family Communication: son, [name] spoke with palliative provider  Disposition Plan: likely residential hospice, [facility] consulted  Consults called: palliative   Richarda Osmond, DO [facility]  02/18/2021, 10:00 PM    To contact the appropriate [facility] Attending or  Consulting provider: Check the care team in [facility] for a) attending/consulting [facility] provider name and b) the [facility] team listed Log into [email or web address] and use [place]'s universal password to access. If you do not have the password, please contact the hospital operator. Locate the [facility] provider you are looking for under [facility] and page to a number that you can be directly reached. (Text pages appreciated) If you still have difficulty reaching the provider, please page the [facility] (Director on Call) for the Hospitalists listed on amion for assistance.

## 2021-02-18 NOTE — ED Provider Notes (Signed)
[facility] EMERGENCY DEPARTMENT Provider Note   CSN: [id number] Arrival date & time: 02/18/21  1132     History Chief Complaint  Patient presents with   Weakness    Louis Reid is a 87 y.o. male.  He is presenting by ambulance from his home in ?assisted living.  He has significant congestive heart failure and is currently on hospice.  Complaining of 2 weeks of increased fatigue, poor appetite, weight loss.  No chest pain although he says have some right axillary chest soreness depending on the the way he lays.  Becomes very fatigued even with the most minimal of effort.  No fevers or chills.  No abdominal pain vomiting or diarrhea.  No recent falls  The history is provided by the patient and the EMS personnel.  Weakness Severity:  Severe Onset quality:  Gradual Duration:  2 weeks Timing:  Constant Progression:  Worsening Chronicity:  New Relieved by:  Nothing Worsened by:  Activity Ineffective treatments:  Drinking fluids and rest Associated symptoms: chest pain, difficulty walking and shortness of breath   Associated symptoms: no abdominal pain, no cough, no dysuria, no falls, no fever, no foul-smelling urine, no headaches, no loss of consciousness, no vision change and no vomiting   Risk factors: congestive heart failure       Past Medical History:  Diagnosis Date   AICD (automatic cardioverter/defibrillator) present 09/13/2012   Anemia    CAD (coronary artery disease)    a. s/p remote CABG, b. AL STEMI c/b VF arrest in 05/2012 => LHC 05/10/12: Severe 3v CAD, S-OM1/2 ok, SVG-Dx ok, SVG-RCA occluded, LIMA-LAD atretic, proximal LAD occluded. EF 20% with anterior apical HK=> salvage PCI with POBA and thrombectomy of the occluded LAD;  c. 01/2017: atretic LIMA_LAD and known occluded SVG-RCA. Patent LAD stent and 70% proxRCA stenosis (FFR 0.88).    Chronic systolic heart failure ([place])    a. Echo 12/13: EF 20-25%, anteroseptal, inferior, apical HK, mildly  reduced RV function, trivial pericardial effusion;   b.  Echo 3/14:  Ant, septal, apical AK, EF 25%   Clotting disorder ([place])    Depression    "years ago" (02/28/2018)   History of kidney stones    HLD (hyperlipidemia)    Hypertension    "I've never had high blood pressure; on RX for other reasons" (02/28/2018)   Inguinal hernia    "have one now on my left side" (09/13/2012); (02/28/2018)   Ischemic cardiomyopathy    Myocardial infarction [facility]) 1996;  05/2012   Osteoporosis    Paroxysmal atrial fibrillation ([place]) 03/17/2016   noted on device interrogation,  will follow burden   Pectus excavatum    Pulmonary embolism ([place])    a. after adhesiolysis for SBO in 04/2012 => coumadin for 6 mos   Renal cyst    SBO (small bowel obstruction) [facility]) November 2013   s/p lysis of adhesions   Sinoatrial node dysfunction ([place])    Skin cancer    "left ear; face" (02/28/2018)   Ventricular fibrillation ([place]) 05/2012   . in setting of AL STEMI 12/13 => EF 20-25% => d/c on LifeVest (repeat echo planned in 08/2012)    Patient Active Problem List   Diagnosis Date Noted   Palliative care encounter    Goals of care, counseling/discussion    Encounter for hospice care discussion    Heart failure ([place]) 02/28/2018   Decreased cardiac ejection fraction 01/10/2018   Nonspecific chest pain    Chronic combined  systolic and diastolic heart failure (HCC)    Unstable angina ([place]):  (01/20/2017) a. 3-v CAD w/ patent SVG to diag and patent sequential SVG to OM 2 and OM 3. Patent prox LAD stent. LIMA to LAD and SVG to RCA known occl. stable prox RCA sten  01/19/2017   Claudication ([place]) 01/22/2014   Chronic kidney disease 12/26/2013   ICD (implantable cardioverter-defibrillator) in place 02/16/2013   Ischemic cardiomyopathy 09/09/2012   Postop check 07/05/2012   STEMI (ST elevation myocardial infarction) ([place]) 05/18/2012   Anemia in chronic kidney disease(285.21) 05/17/2012   PE (pulmonary embolism) 05/17/2012    Physical deconditioning 05/17/2012   Anorexia 05/17/2012   Cardiogenic shock ([place]) 05/16/2012   Arterial hypotension 05/16/2012   Ventricular fibrillation arrest 05/10/2012   Cardiac arrest ([place]) 05/10/2012   Acute respiratory failure with hypoxia 2/2 cardiac arrest 05/10/2012   Acute encephalopathy 2/2 cardiac arrest 05/10/2012   Hyperglycemia 05/10/2012   Odynophagia 05/07/2012   Coronary artery disease 07/09/2011   History of coronary artery bypass surgery 07/09/2011   DYSPNEA 06/04/2010   HLD (hyperlipidemia) 04/17/2009   Essential hypertension 04/17/2009   Asymptomatic old MI (myocardial infarction) 04/17/2009   TOBACCO USE, QUIT 04/17/2009    Past Surgical History:  Procedure Laterality Date   CATARACT EXTRACTION W/ INTRAOCULAR LENS  IMPLANT, BILATERAL Bilateral    COLONOSCOPY     CORONARY ANGIOPLASTY WITH STENT PLACEMENT     "I've got 2 or 3 stents" (02/28/2018)   CORONARY ARTERY BYPASS GRAFT  1996   "CABG X5" (09/13/2012)   CYSTOSCOPY/RETROGRADE/URETEROSCOPY/STONE EXTRACTION WITH BASKET  1990's   ESOPHAGOGASTRODUODENOSCOPY  05/08/2012   Procedure: ESOPHAGOGASTRODUODENOSCOPY (EGD);  Surgeon: Juanita Craver, MD;  Location[facility] ENDOSCOPY;  Service: Endoscopy;  Laterality: N/A;  Nevin Bloodgood put on for Dr. Collene Mares , Dr. Collene Mares will call if she wants another time   IMPLANTABLE CARDIOVERTER DEFIBRILLATOR IMPLANT N/A 09/13/2012   SJM Ellipse ER implanted by Dr Rayann Heman for secondary prevention   INGUINAL HERNIA REPAIR Right 1996   INTRAVASCULAR PRESSURE WIRE/FFR STUDY N/A 01/20/2017   Procedure: INTRAVASCULAR PRESSURE WIRE/FFR STUDY;  Surgeon: Wellington Hampshire, MD;  Location: [place] CV LAB;  Service: Cardiovascular;  Laterality: N/A;   LAPAROTOMY  04/27/2012   Procedure: EXPLORATORY LAPAROTOMY;  Surgeon: Gwenyth Ober, MD;  Location: [place] [facility];  Service: General;  Laterality: N/A;   LEFT HEART CATH AND CORS/GRAFTS ANGIOGRAPHY N/A 01/20/2017   Procedure: LEFT HEART CATH AND CORS/GRAFTS ANGIOGRAPHY;   Surgeon: Wellington Hampshire, MD;  Location: [place] CV LAB;  Service: Cardiovascular;  Laterality: N/A;   LEFT HEART CATHETERIZATION WITH CORONARY ANGIOGRAM N/A 05/10/2012   Procedure: LEFT HEART CATHETERIZATION WITH CORONARY ANGIOGRAM;  Surgeon: Burnell Blanks, MD;  Location: [facility] CATH LAB;  Service: Cardiovascular;  Laterality: N/A;   PTCA     percutaneous transluminal coronary intervention and brachy therapy, Bruce R. Olevia Perches, MD. EF 60%   RIGHT HEART CATH N/A 03/01/2018   Procedure: RIGHT HEART CATH;  Surgeon: Jolaine Artist, MD;  Location: [place] CV LAB;  Service: Cardiovascular;  Laterality: N/A;   SKIN CANCER EXCISION     "left ear; face" (02/28/2018)   TRANSURETHRAL RESECTION OF PROSTATE         Family History  Problem Relation Age of Onset   Heart disease Father    Other Mother        brain tumor   Colon cancer Brother    Cancer Brother        Liver   Heart  disease Brother     Social History   Tobacco Use   Smoking status: Former    Types: Cigarettes   Smokeless tobacco: Never   Tobacco comments:    09/14/2011; 02/28/2018  "quit smoking when I was ~ 17; probably didn't smoke 10 packs in my life"  Vaping Use   Vaping Use: Never used  Substance Use Topics   Alcohol use: Never   Drug use: Never    Home Medications Prior to Admission medications   Medication Sig Start Date End Date Taking? Authorizing Provider  apixaban (ELIQUIS) 2.5 MG TABS tablet Take 1 tablet (2.5 mg total) by mouth 2 (two) times daily. 07/09/20   Bensimhon, Shaune Pascal, MD  cholecalciferol (VITAMIN D) 1000 units tablet Take 1,000 Units by mouth daily.    [provider]  cyanocobalamin 1000 MCG tablet Take 2,000 mcg by mouth daily.     [provider]  docusate sodium (COLACE) 100 MG capsule Take 100 mg by mouth 2 (two) times daily.    [provider]  empagliflozin (JARDIANCE) 10 MG TABS tablet Take 1 tablet (10 mg total) by mouth daily before breakfast.  02/03/21   Lelon Perla, MD  feeding supplement, ENSURE ENLIVE, (ENSURE ENLIVE) LIQD Take 237 mLs by mouth 3 (three) times daily between meals. 03/03/18   Georgiana Shore, NP  Ferrous Sulfate (IRON) 325 (65 Fe) MG TABS Take by mouth. Take 1 tablet by mouth daily    [provider]  finasteride (PROSCAR) 5 MG tablet Take 5 mg by mouth daily. 08/30/19   [provider]  furosemide (LASIX) 20 MG tablet TAKE 1 TABLET BY MOUTH EVERY DAY 01/20/21   Lelon Perla, MD  losartan (COZAAR) 25 MG tablet TAKE 0.5 TABLETS (12.5 MG TOTAL) BY MOUTH AT BEDTIME. NEEDS APPT FOR FURTHER REFILLS 12/25/20   Bensimhon, Shaune Pascal, MD  Multiple Vitamins-Minerals (ICAPS AREDS 2 PO) Take 1 tablet by mouth 2 (two) times daily.     [provider]  nitroGLYCERIN (NITROSTAT) 0.4 MG SL tablet Place 1 tablet (0.4 mg total) under the tongue every 5 (five) minutes x 3 doses as needed for chest pain. Take only as needed for chest pain if your blood pressure is greater than 100/80. 01/22/17   Carlota Raspberry, Tiffany, [name]  rosuvastatin (CRESTOR) 20 MG tablet Take 1 tablet (20 mg total) by mouth daily. 04/08/20 01/27/21  Lelon Perla, MD  spironolactone (ALDACTONE) 25 MG tablet Take 0.5 tablets (12.5 mg total) by mouth daily. 01/27/21 04/27/21  Lelon Perla, MD    Allergies    Patient has no known allergies.  Review of Systems   Review of Systems  Constitutional:  Positive for activity change, appetite change and fatigue. Negative for fever.  HENT:  Negative for sore throat.   Eyes:  Negative for visual disturbance.  Respiratory:  Positive for shortness of breath. Negative for cough.   Cardiovascular:  Positive for chest pain.  Gastrointestinal:  Negative for abdominal pain and vomiting.  Genitourinary:  Negative for dysuria.  Musculoskeletal:  Positive for gait problem. Negative for falls.  Skin:  Negative for rash.  Neurological:  Positive for weakness. Negative for loss of consciousness and  headaches.   Physical Exam Updated Vital Signs Pulse 67   Temp (!) 97.4 F (36.3 C) (Oral)   Resp (!) 31   SpO2 100%   Physical Exam Vitals and nursing note reviewed.  Constitutional:      Appearance: Normal appearance. He is  well-developed.  HENT:     Head: Normocephalic and atraumatic.  Eyes:     Conjunctiva/sclera: Conjunctivae normal.  Cardiovascular:     Rate and Rhythm: Normal rate and regular rhythm.     Heart sounds: No murmur heard.    Comments: Pectus excavatum Pulmonary:     Effort: Pulmonary effort is normal. No respiratory distress.     Breath sounds: Normal breath sounds.  Abdominal:     Palpations: Abdomen is soft.     Tenderness: There is no abdominal tenderness. There is no guarding or rebound.  Musculoskeletal:        General: No deformity or signs of injury. Normal range of motion.     Cervical back: Neck supple.  Skin:    General: Skin is warm and dry.  Neurological:     General: No focal deficit present.     Mental Status: He is alert and oriented to person, place, and time.    ED Results / Procedures / Treatments   Labs (all labs ordered are listed, but only abnormal results are displayed) Labs Reviewed  COMPREHENSIVE METABOLIC PANEL - Abnormal; Notable for the following components:      Result Value   CO2 20 (*)    BUN 34 (*)    Creatinine, Ser 1.91 (*)    Total Protein 5.7 (*)    Total Bilirubin 1.9 (*)    GFR, Estimated 34 (*)    All other components within normal limits  LIPASE, BLOOD - Abnormal; Notable for the following components:   Lipase 58 (*)    All other components within normal limits  CBC WITH DIFFERENTIAL/PLATELET - Abnormal; Notable for the following components:   Platelets 132 (*)    All other components within normal limits  TROPONIN I (HIGH SENSITIVITY) - Abnormal; Notable for the following components:   Troponin I (High Sensitivity) 30 (*)    All other components within normal limits  TROPONIN I (HIGH SENSITIVITY) -  Abnormal; Notable for the following components:   Troponin I (High Sensitivity) 30 (*)    All other components within normal limits  URINALYSIS, ROUTINE W REFLEX MICROSCOPIC    EKG EKG Interpretation  Date/Time:  Tuesday February 18 2021 11:34:31 EDT Ventricular Rate:  70 PR Interval:  179 QRS Duration: 89 QT Interval:  506 QTC Calculation: 547 R Axis:   -50 Text Interpretation: Sinus rhythm Atrial premature complexes Probable left atrial enlargement Left anterior fascicular block Borderline low voltage, extremity leads Abnormal T, consider ischemia, lateral leads Prolonged QT interval QT not as long as computer recorded No significant change since prior 9/19 Confirmed by Aletta Edouard [phone]) on 02/18/2021 11:39:05 AM  Radiology DG Chest Port 1 View  Result Date: 02/18/2021 CLINICAL DATA:  Shortness of breath. EXAM: PORTABLE CHEST 1 VIEW COMPARISON:  February 28, 2018. FINDINGS: The heart size and mediastinal contours are within normal limits. Status post coronary bypass graft. Left-sided defibrillator is unchanged in position. Both lungs are clear. The visualized skeletal structures are unremarkable. IMPRESSION: No active disease. Electronically Signed   By: Marijo Conception M.D.   On: 02/18/2021 12:14    Procedures Procedures   Medications Ordered in ED Medications  sodium chloride 0.9 % bolus 250 mL (0 mLs Intravenous Stopped 02/18/21 1315)    ED Course  I have reviewed the triage vital signs and the nursing notes.  Pertinent labs & imaging results that were available during my care of the patient were reviewed by me and considered in  my medical decision making (see chart for details).  Clinical Course as of 02/18/21 1808  Tue Feb 18, 2021  1156 Discussed with Chrislyn Edison Pace from [place] [MB]  1218 Chest x-ray interpreted by me as no acute infiltrates. [MB]  [id number] Hospice states he is actually not under hospice care at the moment.  He was referred to hospice from  palliative and has not been admitted to the hospice team yet.  She is going to reach out to family to find out what their goals are. [MB]    Clinical Course User Index [MB] Hayden Rasmussen, MD   MDM Rules/Calculators/A&P                          This patient complains of generalized weakness failure to thrive poor p.o. intake; this involves an extensive number of treatment Options and is a complaint that carries with it a high risk of complications and Morbidity. The differential includes weakness, dehydration, metabolic derangement, anemia ACS  I ordered, reviewed and interpreted labs, which included CBC with normal white count normal hemoglobin, chemistries with low bicarb elevated creatinine, troponins mildly elevated but flat in the lipase mildly elevated I ordered medication IV fluids with some improvement in his symptoms I ordered imaging studies which included chest x-ray and I independently    visualized and interpreted imaging which showed no acute infiltrates  Previous records obtained and reviewed in epic including prior cardiology notes I consulted transitions of care and hospice and discussed lab and imaging findings  Critical Interventions: None  After the interventions stated above, I reevaluated the patient and found patient to be hemodynamically stable.  I do not think he would be able to manage his situation in assisted living.  Signed out to oncoming provider Dr. Pearline Cables to discuss with admitting team regarding possible admission.  If unable to admit patient will need to board in the ED until physical therapy and social work can try to place in SNF or proceed with hospice plan   Final Clinical Impression(s) / ED Diagnoses Final diagnoses:  Generalized weakness  FTT (failure to thrive) in adult  Dehydration    Rx / DC Orders ED Discharge Orders     None        Hayden Rasmussen, MD 02/18/21 1811

## 2021-02-18 NOTE — ED Notes (Signed)
Per Dr's request to take pt off monitor. Dr states pt is on hospice care and will write an order for it.

## 2021-02-19 DIAGNOSIS — R531 Weakness: Secondary | ICD-10-CM | POA: Diagnosis not present

## 2021-02-19 DIAGNOSIS — R627 Adult failure to thrive: Secondary | ICD-10-CM | POA: Diagnosis not present

## 2021-02-19 NOTE — Evaluation (Signed)
Physical Therapy Evaluation Patient Details Name: Louis Reid MRN: [id number] DOB: [birth year] Today's Date: 02/19/2021  History of Present Illness  Pt is an 87 y/o male admitted from ALF on 9/13 secondary to increased weakness. PMH includes HTN, CAD, PE, s/p ICD, and CHF.  Clinical Impression  Pt admitted secondary to problem above with deficits below. Pt fatiguing very easily and only able to tolerate side steps at EOB. Required min A for mobility tasks this session. Pt becoming tearful during session about current situation and reports multiple times he did not want to go back to his current facility. Reports he feels like he is not getting the help he needs. Per notes, possibly looking to transition to hospice, but unsure at this time. If pt should not transition to hospice services, would likely benefit from SNF level care at d/c as he reports he feels he needs increase in services. Will continue to follow acutely to maximize functional mobility independence and safety.        Recommendations for follow up therapy are one component of a multi-disciplinary discharge planning process, led by the attending physician.  Recommendations may be updated based on patient status, additional functional criteria and insurance authorization.  Follow Up Recommendations Other (comment) (SNF vs follow up with hospice services)    Equipment Recommendations  Wheelchair cushion (measurements PT);Wheelchair (measurements PT) (Needs WC with leg rests)    Recommendations for Other Services       Precautions / Restrictions Precautions Precautions: Fall Restrictions Weight Bearing Restrictions: No      Mobility  Bed Mobility Overal bed mobility: Needs Assistance Bed Mobility: Supine to Sit;Sit to Supine     Supine to sit: Min assist Sit to supine: Min assist   General bed mobility comments: Required assist for trunk and LE assist.    Transfers Overall transfer level: Needs  assistance Equipment used: 1 person hand held assist Transfers: Sit to/from Stand Sit to Stand: Min assist         General transfer comment: Min A for lift assist and steadying.  Ambulation/Gait Ambulation/Gait assistance: Min assist   Assistive device: 1 person hand held assist       General Gait Details: Pt holding to PT arms for support. Min A for steadying to take side steps at EOB. Pt with increased fatigue and unable to tolerate further mobility.  Stairs            Wheelchair Mobility    Modified Rankin (Stroke Patients Only)       Balance Overall balance assessment: Needs assistance Sitting-balance support: No upper extremity supported;Feet supported Sitting balance-Leahy Scale: Fair     Standing balance support: Bilateral upper extremity supported Standing balance-Leahy Scale: Poor Standing balance comment: Reliant on UE support                             Pertinent Vitals/Pain Pain Assessment: No/denies pain    Home Living Family/patient expects to be discharged to:: Assisted living               Home Equipment: Wheelchair - manual Additional Comments: Reports his wheelchair does not have the leg rests on the. Requesting not to return back to his current ALF.    Prior Function Level of Independence: Needs assistance   Gait / Transfers Assistance Needed: Reports he used WC for longer distance mobility to dining hall and staff assisted with propulsion. Was only able to ambulate a  few feet before becoming very fatigued.  ADL's / Homemaking Assistance Needed: Reports he needed assist with ADLs, but reports he did not feel staff provided necessary assist.        Hand Dominance        Extremity/Trunk Assessment   Upper Extremity Assessment Upper Extremity Assessment: Generalized weakness    Lower Extremity Assessment Lower Extremity Assessment: Generalized weakness    Cervical / Trunk Assessment Cervical / Trunk  Assessment: Kyphotic  Communication   Communication: No difficulties  Cognition Arousal/Alertness: Awake/alert Behavior During Therapy: Anxious Overall Cognitive Status: Within Functional Limits for tasks assessed                                 General Comments: Pt very anxious about current situation. REports he feels financially and mentally strained. Reports multiple times he does not want to go back to his current facility.      General Comments General comments (skin integrity, edema, etc.): No family present during session.    Exercises     Assessment/Plan    PT Assessment Patient needs continued PT services  PT Problem List Decreased strength;Decreased activity tolerance;Decreased balance;Decreased mobility;Cardiopulmonary status limiting activity       PT Treatment Interventions Gait training;Functional mobility training;Therapeutic activities;Therapeutic exercise;Balance training;Patient/family education    PT Goals (Current goals can be found in the Care Plan section)  Acute Rehab PT Goals Patient Stated Goal: to not go back to current facility PT Goal Formulation: With patient Time For Goal Achievement: 03/05/21 Potential to Achieve Goals: Fair    Frequency Min 2X/week   Barriers to discharge        Co-evaluation               AM-PAC PT "6 Clicks" Mobility  Outcome Measure Help needed turning from your back to your side while in a flat bed without using bedrails?: A Little Help needed moving from lying on your back to sitting on the side of a flat bed without using bedrails?: A Little Help needed moving to and from a bed to a chair (including a wheelchair)?: A Little Help needed standing up from a chair using your arms (e.g., wheelchair or bedside chair)?: A Little Help needed to walk in hospital room?: A Lot Help needed climbing 3-5 steps with a railing? : Total 6 Click Score: 15    End of Session Equipment Utilized During  Treatment: Gait belt Activity Tolerance: Patient limited by fatigue Patient left: in bed;with call bell/phone within reach (on stretcher in ED) Nurse Communication: Mobility status PT Visit Diagnosis: Unsteadiness on feet (R26.81);Muscle weakness (generalized) (M62.81)    Time: [id number] PT Time Calculation (min) (ACUTE ONLY): 23 min   Charges:   PT Evaluation $PT Eval Moderate Complexity: 1 Mod PT Treatments $Therapeutic Activity: 8-22 mins        [name], DPT  Acute Rehabilitation Services  Pager: [phone] Office: [phone]   [name] 02/19/2021, 11:04 AM

## 2021-02-19 NOTE — ED Notes (Signed)
Pt reports burning sensation with urination

## 2021-02-19 NOTE — Progress Notes (Addendum)
PROGRESS NOTE    Louis Reid  [id number] DOB: 1932/10/19 DOA: 02/18/2021 PCP: Louis Downing, MD  Brief Narrative: 87 year old chronically ill elderly male with advanced cardiomyopathy, chronic systolic CHF, EF of 123456, ICD, CAD, hypertension, history of chronic anemia, history of Afib on eliquis presented from his facility with worsening generalized weakness, ongoing weight loss, poor p.o. intake. -He is followed by hospice at [facility] independent living   Assessment & Plan:   Adult failure to thrive Hypotension Generalized weakness, poor appetite, weight loss -I suspect this is secondary to his cardiomyopathy, cardiac cachexia  -Hypotension also likely contributing, will hold losartan and Aldactone -Palliative care note from 9/8 with comment regarding patient requesting hospice services on account of worsening functional status, however at this time patient indicates wanting work-up/treatment -Monitor blood pressure, PT OT eval -I think he will need a higher level of care, Residential Hospice vs SNF -Was unable to contact his son today will reattempt later  Advanced cardiomyopathy, chronic systolic CHF EF 123456 with ICD -On low-dose Lasix 10 mg daily now, clinically appears euvolemic  CKD 3 a -Creatinine relatively stable  H/o P.Afib on eliquis  Severe protein calorie malnutrition  BPH -Continue Proscar   DVT prophylaxis:eliquis Code Status: DNR Family Communication: No family at bedside, attempted to reach patient's son Louis Reid x2 today without success, left message Disposition Plan:  Status is: Observation  The patient will require care spanning > 2 midnights and should be moved to inpatient because: Inpatient level of care appropriate due to severity of illness  Dispo: The patient is from: Home              Anticipated d/c is to: TBD              Patient currently is not medically stable to d/c.   Difficult to place patient No         Consultants:    Procedures:   Antimicrobials:    Subjective: -Complains of generalized weakness, denies any shortness of breath, also has poor appetite, ongoing weight loss  Objective: Vitals:   02/19/21 1215 02/19/21 1230 02/19/21 1245 02/19/21 1300  BP: (!) 124/92 113/63 101/70 100/64  Pulse: 81 69 (!) 102 (!) 51  Resp: '19 18 19 '$ (!) 21  Temp:      TempSrc:      SpO2: 100% 100% 98% 96%   No intake or output data in the 24 hours ending 02/19/21 1352 There were no vitals filed for this visit.  Examination:  General exam: Appears calm and comfortable  Respiratory system: Clear to auscultation Cardiovascular system: S1 & S2 heard, RRR.  Abd: nondistended, soft and nontender.Normal bowel sounds heard. Central nervous system: Alert and oriented. No focal neurological deficits. Extremities: no edema Skin: No rashes Psychiatry: Judgement and insight appear normal. Mood & affect appropriate.     Data Reviewed:   CBC: Recent Labs  Lab 02/18/21 1214  WBC 7.0  NEUTROABS 5.3  HGB 14.7  HCT 44.6  MCV 95.9  PLT [id number]*   Basic Metabolic Panel: Recent Labs  Lab 02/18/21 1214  NA 142  K 4.3  CL 111  CO2 20*  GLUCOSE 85  BUN 34*  CREATININE 1.91*  CALCIUM 9.6   GFR: CrCl cannot be calculated (Unknown ideal weight.). Liver Function Tests: Recent Labs  Lab 02/18/21 1214  AST 26  ALT 22  ALKPHOS 53  BILITOT 1.9*  PROT 5.7*  ALBUMIN 3.5   Recent Labs  Lab 02/18/21 1214  LIPASE 58*   No results for input(s): AMMONIA in the last 168 hours. Coagulation Profile: No results for input(s): INR, PROTIME in the last 168 hours. Cardiac Enzymes: No results for input(s): CKTOTAL, CKMB, CKMBINDEX, TROPONINI in the last 168 hours. BNP (last 3 results) No results for input(s): PROBNP in the last 8760 hours. HbA1C: No results for input(s): HGBA1C in the last 72 hours. CBG: No results for input(s): GLUCAP in the last 168 hours. Lipid Profile: No results for  input(s): CHOL, HDL, LDLCALC, TRIG, CHOLHDL, LDLDIRECT in the last 72 hours. Thyroid Function Tests: No results for input(s): TSH, T4TOTAL, FREET4, T3FREE, THYROIDAB in the last 72 hours. Anemia Panel: No results for input(s): VITAMINB12, FOLATE, FERRITIN, TIBC, IRON, RETICCTPCT in the last 72 hours. Urine analysis:    Component Value Date/Time   COLORURINE YELLOW 02/18/2021 2010   APPEARANCEUR CLEAR 02/18/2021 2010   LABSPEC 1.012 02/18/2021 2010   PHURINE 5.0 02/18/2021 2010   GLUCOSEU >=500 (A) 02/18/2021 2010   HGBUR SMALL (A) 02/18/2021 2010   BILIRUBINUR NEGATIVE 02/18/2021 2010   KETONESUR 5 (A) 02/18/2021 2010   PROTEINUR 30 (A) 02/18/2021 2010   UROBILINOGEN 1.0 05/24/2012 1330   NITRITE NEGATIVE 02/18/2021 2010   LEUKOCYTESUR NEGATIVE 02/18/2021 2010   Sepsis Labs: '@LABRCNTIP'$ (procalcitonin:4,lacticidven:4)  )No results found for this or any previous visit (from the past 240 hour(s)).       Radiology Studies: DG Chest Port 1 View  Result Date: 02/18/2021 CLINICAL DATA:  Shortness of breath. EXAM: PORTABLE CHEST 1 VIEW COMPARISON:  February 28, 2018. FINDINGS: The heart size and mediastinal contours are within normal limits. Status post coronary bypass graft. Left-sided defibrillator is unchanged in position. Both lungs are clear. The visualized skeletal structures are unremarkable. IMPRESSION: No active disease. Electronically Signed   By: Marijo Conception M.D.   On: 02/18/2021 12:14        Scheduled Meds:  apixaban  2.5 mg Oral BID   feeding supplement  237 mL Oral TID BM   finasteride  5 mg Oral Daily   furosemide  10 mg Oral Daily   Continuous Infusions:   LOS: 0 days    Time spent: 25mn   PDomenic Polite MD [facility]   02/19/2021, 1:52 PM

## 2021-02-19 NOTE — ED Notes (Signed)
Jacinta Shoe, MD at bedside.

## 2021-02-20 DIAGNOSIS — E860 Dehydration: Secondary | ICD-10-CM

## 2021-02-20 DIAGNOSIS — R531 Weakness: Secondary | ICD-10-CM | POA: Diagnosis not present

## 2021-02-20 DIAGNOSIS — R627 Adult failure to thrive: Secondary | ICD-10-CM | POA: Diagnosis not present

## 2021-02-20 LAB — CBC
HCT: 38.6 % — ABNORMAL LOW (ref 39.0–52.0)
Hemoglobin: 12.9 g/dL — ABNORMAL LOW (ref 13.0–17.0)
MCH: 31.9 pg (ref 26.0–34.0)
MCHC: 33.4 g/dL (ref 30.0–36.0)
MCV: 95.5 fL (ref 80.0–100.0)
Platelets: 116 10*3/uL — ABNORMAL LOW (ref 150–400)
RBC: 4.04 MIL/uL — ABNORMAL LOW (ref 4.22–5.81)
RDW: 14.4 % (ref 11.5–15.5)
WBC: 5.7 10*3/uL (ref 4.0–10.5)
nRBC: 0 % (ref 0.0–0.2)

## 2021-02-20 LAB — COMPREHENSIVE METABOLIC PANEL
ALT: 22 U/L (ref 0–44)
AST: 26 U/L (ref 15–41)
Albumin: 3 g/dL — ABNORMAL LOW (ref 3.5–5.0)
Alkaline Phosphatase: 48 U/L (ref 38–126)
Anion gap: 7 (ref 5–15)
BUN: 36 mg/dL — ABNORMAL HIGH (ref 8–23)
CO2: 24 mmol/L (ref 22–32)
Calcium: 8.8 mg/dL — ABNORMAL LOW (ref 8.9–10.3)
Chloride: 109 mmol/L (ref 98–111)
Creatinine, Ser: 2.02 mg/dL — ABNORMAL HIGH (ref 0.61–1.24)
GFR, Estimated: 32 mL/min — ABNORMAL LOW (ref >60)
Glucose, Bld: 166 mg/dL — ABNORMAL HIGH (ref 70–99)
Potassium: 3.9 mmol/L (ref 3.5–5.1)
Sodium: 140 mmol/L (ref 135–145)
Total Bilirubin: 1.3 mg/dL — ABNORMAL HIGH (ref 0.3–1.2)
Total Protein: 4.8 g/dL — ABNORMAL LOW (ref 6.5–8.1)

## 2021-02-20 LAB — SARS CORONAVIRUS 2 (TAT 6-24 HRS): SARS Coronavirus 2: NEGATIVE

## 2021-02-20 MED ORDER — DOCUSATE SODIUM 100 MG PO CAPS
100.0000 mg | ORAL_CAPSULE | Freq: Every day | ORAL | Status: DC
  Administered 2021-02-20: 100 mg via ORAL
  Filled 2021-02-20: qty 1

## 2021-02-20 MED ORDER — ONDANSETRON HCL 4 MG/2ML IJ SOLN: 4.0000 mg | Freq: Four times a day (QID) | INTRAMUSCULAR | Status: DC | PRN

## 2021-02-20 NOTE — Progress Notes (Signed)
Manufacturing engineer Louis Reid) Community Based Palliative Care       This patient is currently followed by palliative care services in the community.  Pt was recently referred by [facility] palliative NP for hospice services; pt is pending transition to hospice at this time.   Met with patient at bedside, he is very interested in hospice at discharge. Unsure at this time where pt will be discharge to. LTC facility with our hospice services may be needed, because the patient doesn't want to return to [place]. Son Louis Reid is involved in pt care and is also on board with hospice.  We will continue to follow this patient and help with any disposition plans for hospice.   Thank you for the opportunity to participate in this patient's care.     [name], BSN, RN [facility] Liaison [phone] (24h on call)

## 2021-02-20 NOTE — Plan of Care (Signed)
  Problem: Activity: Goal: Risk for activity intolerance will decrease Outcome: Progressing   Problem: Coping: Goal: Level of anxiety will decrease Outcome: Progressing   

## 2021-02-20 NOTE — Progress Notes (Signed)
PROGRESS NOTE    Louis Reid  [id number] DOB: [birth year] DOA: 02/18/2021 PCP: [name], MD  Brief Narrative: 87 year old chronically ill elderly male with advanced cardiomyopathy, chronic systolic CHF, EF of 123456, ICD, CAD, hypertension, history of chronic anemia, history of Afib on eliquis presented from his facility with worsening generalized weakness, ongoing weight loss, poor p.o. intake. -He is followed by hospice at [place]:   Adult failure to thrive Hypotension Generalized weakness, poor appetite, weight loss -I suspect this is secondary to his cardiomyopathy, cardiac cachexia  -Hypotension was also likely contributing, held losartan and Aldactone -Palliative care note from 9/8: recently restarted on hospice -BP stable now, creatinine slightly worse -po intake is poor, some nausea-add Iv zofran -TOC/Hospice CSW consult, unclear if he can go back to [place] with Hospice services and more supervision vs Long term SNF  Advanced cardiomyopathy, chronic systolic CHF EF 123456 with ICD -On low-dose Lasix 10 mg daily now, clinically appears euvolemic  AKi on CKD 3 a -Creatinine slightly worse -clinically euvolemic, BP better, ARB and Aldactone on hold -continue low dose lasix  H/o P.Afib on eliquis  Severe protein calorie malnutrition -advised protein supplements  BPH -Continue Proscar   DVT prophylaxis:eliquis Code Status: DNR Family Communication: No family at bedside, was finally able to reach patient's son Shaughn Bjorklund after multiple attempts Disposition Plan:  Status is: Observation  The patient will require care spanning > 2 midnights and should be moved to inpatient because: Inpatient level of care appropriate due to severity of illness, hemodynamic instability, unsafe discharge plan  Dispo: The patient is from: ALF              Anticipated d/c is to: TBD              Patient currently is not medically stable to  d/c.   Difficult to place patient No Consultants:    Procedures:   Antimicrobials:    Subjective: -A bit tired overall but feels a little better from yesterday, complains of nausea, p.o. intake is poor, denies any dyspnea or swelling Objective: Vitals:   02/20/21 0028 02/20/21 0433 02/20/21 1042 02/20/21 1251  BP: 136/66 (!) 107/55 120/72 125/69  Pulse: (!) 56 62 (!) 52 63  Resp: 20 19    Temp: 98 F (36.7 C) 97.8 F (36.6 C) 97.8 F (36.6 C) 98 F (36.7 C)  TempSrc: Oral Oral Oral Oral  SpO2: 97% 98% 98% 98%  Weight: 50.2 kg     Height: '5\' 11"'$  (1.803 m)       Intake/Output Summary (Last 24 hours) at 02/20/2021 1409 Last data filed at 02/20/2021 1253 Gross per 24 hour  Intake 360 ml  Output 400 ml  Net -40 ml   Filed Weights   02/20/21 0028  Weight: 50.2 kg    Examination:  General exam: Appears calm and comfortable  Respiratory system: Clear to auscultation Cardiovascular system: S1 & S2 heard, RRR.  Abd: nondistended, soft and nontender.Normal bowel sounds heard. Central nervous system: Alert and oriented. No focal neurological deficits. Extremities: no edema Skin: No rashes Psychiatry: Judgement and insight appear normal. Mood & affect appropriate.     Data Reviewed:   CBC: Recent Labs  Lab 02/18/21 1214 02/20/21 0238  WBC 7.0 5.7  NEUTROABS 5.3  --   HGB 14.7 12.9*  HCT 44.6 38.6*  MCV 95.9 95.5  PLT 132* [id number]*   Basic Metabolic Panel: Recent Labs  Lab 02/18/21 1214  02/20/21 0238  NA 142 140  K 4.3 3.9  CL 111 109  CO2 20* 24  GLUCOSE 85 166*  BUN 34* 36*  CREATININE 1.91* 2.02*  CALCIUM 9.6 8.8*   GFR: Estimated Creatinine Clearance: 18.6 mL/min (A) (by C-G formula based on SCr of 2.02 mg/dL (H)). Liver Function Tests: Recent Labs  Lab 02/18/21 1214 02/20/21 0238  AST 26 26  ALT 22 22  ALKPHOS 53 48  BILITOT 1.9* 1.3*  PROT 5.7* 4.8*  ALBUMIN 3.5 3.0*   Recent Labs  Lab 02/18/21 1214  LIPASE 58*   No results for  input(s): AMMONIA in the last 168 hours. Coagulation Profile: No results for input(s): INR, PROTIME in the last 168 hours. Cardiac Enzymes: No results for input(s): CKTOTAL, CKMB, CKMBINDEX, TROPONINI in the last 168 hours. BNP (last 3 results) No results for input(s): PROBNP in the last 8760 hours. HbA1C: No results for input(s): HGBA1C in the last 72 hours. CBG: No results for input(s): GLUCAP in the last 168 hours. Lipid Profile: No results for input(s): CHOL, HDL, LDLCALC, TRIG, CHOLHDL, LDLDIRECT in the last 72 hours. Thyroid Function Tests: No results for input(s): TSH, T4TOTAL, FREET4, T3FREE, THYROIDAB in the last 72 hours. Anemia Panel: No results for input(s): VITAMINB12, FOLATE, FERRITIN, TIBC, IRON, RETICCTPCT in the last 72 hours. Urine analysis:    Component Value Date/Time   COLORURINE YELLOW 02/18/2021 2010   APPEARANCEUR CLEAR 02/18/2021 2010   LABSPEC 1.012 02/18/2021 2010   PHURINE 5.0 02/18/2021 2010   GLUCOSEU >=500 (A) 02/18/2021 2010   HGBUR SMALL (A) 02/18/2021 2010   BILIRUBINUR NEGATIVE 02/18/2021 2010   KETONESUR 5 (A) 02/18/2021 2010   PROTEINUR 30 (A) 02/18/2021 2010   UROBILINOGEN 1.0 05/24/2012 1330   NITRITE NEGATIVE 02/18/2021 2010   LEUKOCYTESUR NEGATIVE 02/18/2021 2010   Sepsis Labs: '@LABRCNTIP'$ (procalcitonin:4,lacticidven:4)  ) Recent Results (from the past 240 hour(s))  SARS CORONAVIRUS 2 (TAT 6-24 HRS) Nasopharyngeal Nasopharyngeal Swab     Status: None   Collection Time: 02/19/21  9:16 PM   Specimen: Nasopharyngeal Swab  Result Value Ref Range Status   SARS Coronavirus 2 NEGATIVE NEGATIVE Final    Comment: (NOTE) SARS-CoV-2 target nucleic acids are NOT DETECTED.  The SARS-CoV-2 RNA is generally detectable in upper and lower respiratory specimens during the acute phase of infection. Negative results do not preclude SARS-CoV-2 infection, do not rule out co-infections with other pathogens, and should not be used as the sole basis  for treatment or other patient management decisions. Negative results must be combined with clinical observations, patient history, and epidemiological information. The expected result is Negative.  Fact Sheet for Patients: SugarRoll.be  Fact Sheet for Healthcare Providers: https://www.woods-mathews.com/  This test is not yet approved or cleared by the [place] FDA and  has been authorized for detection and/or diagnosis of SARS-CoV-2 by FDA under an Emergency Use Authorization (EUA). This EUA will remain  in effect (meaning this test can be used) for the duration of the COVID-19 declaration under Se ction 564(b)(1) of the Act, 21 U.S.C. section 360bbb-3(b)(1), unless the authorization is terminated or revoked sooner.  Performed at [facility], [place] [address] [place], [place] [address]     Scheduled Meds:  apixaban  2.5 mg Oral BID   docusate sodium  100 mg Oral QHS   feeding supplement  237 mL Oral TID BM   finasteride  5 mg Oral Daily   furosemide  10 mg Oral Daily   Continuous Infusions:  LOS: 0 days    Time spent: 78mn   PDomenic Polite MD [facility]   02/20/2021, 2:09 PM

## 2021-02-20 NOTE — Plan of Care (Signed)
  Problem: Education: Goal: Knowledge of General Education information will improve Description: Including pain rating scale, medication(s)/side effects and non-pharmacologic comfort measures Outcome: Progressing   Problem: Elimination: Goal: Will not experience complications related to urinary retention Outcome: Progressing   

## 2021-02-21 DIAGNOSIS — I255 Ischemic cardiomyopathy: Secondary | ICD-10-CM | POA: Diagnosis present

## 2021-02-21 DIAGNOSIS — D631 Anemia in chronic kidney disease: Secondary | ICD-10-CM | POA: Diagnosis present

## 2021-02-21 DIAGNOSIS — Z86711 Personal history of pulmonary embolism: Secondary | ICD-10-CM | POA: Diagnosis not present

## 2021-02-21 DIAGNOSIS — Z681 Body mass index (BMI) 19 or less, adult: Secondary | ICD-10-CM | POA: Diagnosis not present

## 2021-02-21 DIAGNOSIS — I252 Old myocardial infarction: Secondary | ICD-10-CM | POA: Diagnosis not present

## 2021-02-21 DIAGNOSIS — Q676 Pectus excavatum: Secondary | ICD-10-CM | POA: Diagnosis not present

## 2021-02-21 DIAGNOSIS — I5084 End stage heart failure: Secondary | ICD-10-CM | POA: Diagnosis present

## 2021-02-21 DIAGNOSIS — Z20822 Contact with and (suspected) exposure to covid-19: Secondary | ICD-10-CM | POA: Diagnosis present

## 2021-02-21 DIAGNOSIS — I2510 Atherosclerotic heart disease of native coronary artery without angina pectoris: Secondary | ICD-10-CM | POA: Diagnosis present

## 2021-02-21 DIAGNOSIS — I959 Hypotension, unspecified: Secondary | ICD-10-CM | POA: Diagnosis present

## 2021-02-21 DIAGNOSIS — E860 Dehydration: Secondary | ICD-10-CM | POA: Diagnosis present

## 2021-02-21 DIAGNOSIS — I5032 Chronic diastolic (congestive) heart failure: Secondary | ICD-10-CM | POA: Diagnosis present

## 2021-02-21 DIAGNOSIS — R627 Adult failure to thrive: Secondary | ICD-10-CM | POA: Diagnosis present

## 2021-02-21 DIAGNOSIS — R300 Dysuria: Secondary | ICD-10-CM | POA: Diagnosis present

## 2021-02-21 DIAGNOSIS — E785 Hyperlipidemia, unspecified: Secondary | ICD-10-CM | POA: Diagnosis present

## 2021-02-21 DIAGNOSIS — I495 Sick sinus syndrome: Secondary | ICD-10-CM | POA: Diagnosis present

## 2021-02-21 DIAGNOSIS — I480 Paroxysmal atrial fibrillation: Secondary | ICD-10-CM | POA: Diagnosis present

## 2021-02-21 DIAGNOSIS — E43 Unspecified severe protein-calorie malnutrition: Secondary | ICD-10-CM | POA: Diagnosis present

## 2021-02-21 DIAGNOSIS — N400 Enlarged prostate without lower urinary tract symptoms: Secondary | ICD-10-CM | POA: Diagnosis present

## 2021-02-21 DIAGNOSIS — N179 Acute kidney failure, unspecified: Secondary | ICD-10-CM | POA: Diagnosis present

## 2021-02-21 DIAGNOSIS — N1831 Chronic kidney disease, stage 3a: Secondary | ICD-10-CM | POA: Diagnosis present

## 2021-02-21 DIAGNOSIS — R531 Weakness: Secondary | ICD-10-CM | POA: Diagnosis not present

## 2021-02-21 DIAGNOSIS — I130 Hypertensive heart and chronic kidney disease with heart failure and stage 1 through stage 4 chronic kidney disease, or unspecified chronic kidney disease: Secondary | ICD-10-CM | POA: Diagnosis present

## 2021-02-21 DIAGNOSIS — Z66 Do not resuscitate: Secondary | ICD-10-CM | POA: Diagnosis present

## 2021-02-21 DIAGNOSIS — K5900 Constipation, unspecified: Secondary | ICD-10-CM | POA: Diagnosis present

## 2021-02-21 LAB — BASIC METABOLIC PANEL
Anion gap: 10 (ref 5–15)
BUN: 37 mg/dL — ABNORMAL HIGH (ref 8–23)
CO2: 23 mmol/L (ref 22–32)
Calcium: 9.4 mg/dL (ref 8.9–10.3)
Chloride: 107 mmol/L (ref 98–111)
Creatinine, Ser: 1.8 mg/dL — ABNORMAL HIGH (ref 0.61–1.24)
GFR, Estimated: 36 mL/min — ABNORMAL LOW (ref >60)
Glucose, Bld: 115 mg/dL — ABNORMAL HIGH (ref 70–99)
Potassium: 3.8 mmol/L (ref 3.5–5.1)
Sodium: 140 mmol/L (ref 135–145)

## 2021-02-21 MED ORDER — ADULT MULTIVITAMIN W/MINERALS CH
1.0000 | ORAL_TABLET | Freq: Every day | ORAL | Status: DC
  Administered 2021-02-21 – 2021-02-25 (×5): 1 via ORAL
  Filled 2021-02-21 (×5): qty 1

## 2021-02-21 MED ORDER — SENNOSIDES-DOCUSATE SODIUM 8.6-50 MG PO TABS
2.0000 | ORAL_TABLET | Freq: Every day | ORAL | Status: DC
  Administered 2021-02-21 – 2021-02-24 (×4): 2 via ORAL
  Filled 2021-02-21 (×4): qty 2

## 2021-02-21 NOTE — Progress Notes (Signed)
PT Cancellation Note  Patient Details Name: Louis Reid MRN: [id number] DOB: 10/10/1932   Cancelled Treatment:    Reason Eval/Treat Not Completed: Other (comment) Per 9/16 notes, pt plans to d/c with hospice services. No further skilled PT needs at this time. Will sign off. If needs change, please re-consult.   [name], DPT  Acute Rehabilitation Services  Pager: [phone] Office: [phone]    [name] 02/21/2021, 11:40 AM

## 2021-02-21 NOTE — Progress Notes (Signed)
Manufacturing engineer [facility])  [facility] Liaison Note  Met with patient at bedside and discussed that hospice eligibility has been confirmed at this time.   Hospital liaison will follow along and assist as able with any disposition plans.   Thank you for the opportunity to participate in this patient's care  Jhonnie Garner, BSN, RN, [facility] Liaison [phone]

## 2021-02-21 NOTE — Progress Notes (Signed)
Initial Nutrition Assessment  DOCUMENTATION CODES:   Severe malnutrition in context of chronic illness, Underweight  INTERVENTION:   Liberalize diet to REGULAR  Ensure Enlive po TID, each supplement provides 350 kcal and 20 grams of protein  MVI with Minerals  Encourage smaller, more frequent meals; encourage snacking on high protein/high calories snacks/supplements   NUTRITION DIAGNOSIS:   Severe Malnutrition related to chronic illness as evidenced by severe muscle depletion, severe fat depletion, percent weight loss, energy intake < 75% for > or equal to 1 month.   GOAL:   Patient will meet greater than or equal to 90% of their needs  MONITOR:   PO intake, Supplement acceptance, Weight trends  REASON FOR ASSESSMENT:   Malnutrition Screening Tool    ASSESSMENT:   87 yo male admitted with Adult FTT, generalized weakness with poor appetite and weight loss. Pt with advanced cardiomyopathy with chronic CHF, EF 20% with ICD, CKD, CAD  MD suspects FTT and malnutrition is related to severe CM and cardiac cachexia  Pt reports he has lost 80-90% of his taste and 75% of his appetite.  +early satiety; pt reports he can only take about 4 spoonfuls of food before he is full. Pt does not believe he needs to be on a diet but he will do whatever we think is best; pt is currently on 2g sodium diet. Pt reports he does not use the salt shaker and limits his saturated fat but other than that does not follow a diet.  Given extremely limited po intake, recommend liberalizing diet to Regular given presence malnutrition to optimize nutrition   Pt reports he takes a protein shake at home but struggles to get in 1 full shake on a good day. Pt agreeable to Ensure   Pt denies any problems chewing or swallowing. Pt reports he does not feel like he has what would be considered "true nausea" but reports his stomach never "feels good," always feels uncomfortable. Pt reports he feels like he was "born  constipated"-pt reports he takes a stool softener regularly at home and this usually treats his constipation  Pt acknowledges weight loss, reports he weighed 132 pounds 1 month ago. Current wt 110 pounds. Weight loss of 15%. Per weight encounters, pt weighed 125 pounds in August.   Labs: reviewed Meds: lasix, senna-docusate   NUTRITION - FOCUSED PHYSICAL EXAM:  Flowsheet Row Most Recent Value  Orbital Region Severe depletion  Upper Arm Region Severe depletion  Thoracic and Lumbar Region Severe depletion  Buccal Region Severe depletion  Temple Region Severe depletion  Clavicle Bone Region Severe depletion  Clavicle and Acromion Bone Region Severe depletion  Scapular Bone Region Severe depletion  Dorsal Hand Severe depletion  Patellar Region Severe depletion  Anterior Thigh Region Severe depletion  Posterior Calf Region Severe depletion  Edema (RD Assessment) None       Diet Order:  2g sodium diet   EDUCATION NEEDS:   Education needs have been addressed  Skin:  Skin Assessment: Reviewed RN Assessment  Last BM:  9/13  Height:   Ht Readings from Last 1 Encounters:  02/20/21 '5\' 11"'$  (1.803 m)    Weight:   Wt Readings from Last 1 Encounters:  02/20/21 50.2 kg    BMI:  Body mass index is 15.44 kg/m.  Estimated Nutritional Needs:   Kcal:  1700-1800 kcals  Protein:  75-85 g  Fluid:  >/= 1.7 L   Kerman Passey MS, RDN, LDN, CNSC Registered Dietitian III Clinical Nutrition RD  Comptroller Number Located in [place]

## 2021-02-21 NOTE — Progress Notes (Signed)
PROGRESS NOTE    Louis Reid  [id number] DOB: January 03, 1933 DOA: 02/18/2021 PCP: [name], MD  Brief Narrative: 87 year old chronically ill elderly male with advanced cardiomyopathy, chronic systolic CHF, EF of 123456, ICD, CAD, hypertension, history of chronic anemia, history of Afib on eliquis presented from his facility with worsening generalized weakness, ongoing weight loss, poor p.o. intake. -He is followed by hospice at [facility]:   Adult failure to thrive Hypotension Generalized weakness, poor appetite, weight loss -I suspect this is secondary to his severe cardiomyopathy, cardiac cachexia  -Hypotension was also likely contributing, held losartan and Aldactone -Palliative care note from 9/8: ?recently referred to hospice -BP stable now, creatinine worsened and then stabilized -Intermittent nausea, p.o. intake remains poor -TOC/Hospice CSW consult, disposition remains unclear, unsure if long-term care is an option versus back to ALF with more supervision and hospice services  Advanced cardiomyopathy, chronic systolic CHF EF 123456 with ICD -On low-dose Lasix 10 mg daily now, clinically appears euvolemic  AKi on CKD 3 a -Creatinine slightly worse -clinically euvolemic, BP better, ARB and Aldactone on hold -continue low dose lasix  H/o P.Afib on eliquis  Severe protein calorie malnutrition -advised protein supplements  BPH -Continue Proscar   DVT prophylaxis:eliquis Code Status: DNR Family Communication: No family at bedside, was finally able to reach patient's son Louis Reid after multiple attempts yesterday Disposition Plan:  Status is:  Inpatient level of care appropriate due to severity of illness, hemodynamic instability, unsafe discharge plan  Dispo: The patient is from: ALF              Anticipated d/c is to: TBD              Patient currently is not medically stable to d/c.   Difficult to place patient No Consultants:     Procedures:   Antimicrobials:    Subjective: -Feels okay overall, breathing is better, eating 50% of his meals Objective: Vitals:   02/20/21 1842 02/20/21 1951 02/21/21 0532 02/21/21 0750  BP: 1'15/63 92/64 94/66 '$ 122/80  Pulse: 63 82 66 75  Resp: '19 17 16 19  '$ Temp: 97.7 F (36.5 C) 98 F (36.7 C) 98.3 F (36.8 C) 98.1 F (36.7 C)  TempSrc: Oral Oral Oral Oral  SpO2: 90% 98% 98% 99%  Weight:      Height:        Intake/Output Summary (Last 24 hours) at 02/21/2021 1127 Last data filed at 02/21/2021 1039 Gross per 24 hour  Intake 600 ml  Output 650 ml  Net -50 ml   Filed Weights   02/20/21 0028  Weight: 50.2 kg    Examination:  General exam: Pleasant elderly male, laying in bed, AAOx3, no distress HEENT: No JVD Chest,: Anterior chest wall deformity noted CVS: S1-S2, regular rate rhythm Lungs: Clear bilaterally Abdomen: Soft, nontender, bowel sounds present Extremities: No edema Psych: Appropriate mood and affect  Data Reviewed:   CBC: Recent Labs  Lab 02/18/21 1214 02/20/21 0238  WBC 7.0 5.7  NEUTROABS 5.3  --   HGB 14.7 12.9*  HCT 44.6 38.6*  MCV 95.9 95.5  PLT 132* 99991111*   Basic Metabolic Panel: Recent Labs  Lab 02/18/21 1214 02/20/21 0238 02/21/21 0024  NA 142 140 140  K 4.3 3.9 3.8  CL 111 109 107  CO2 20* 24 23  GLUCOSE 85 166* 115*  BUN 34* 36* 37*  CREATININE 1.91* 2.02* 1.80*  CALCIUM 9.6 8.8* 9.4   GFR:  Estimated Creatinine Clearance: 20.9 mL/min (A) (by C-G formula based on SCr of 1.8 mg/dL (H)). Liver Function Tests: Recent Labs  Lab 02/18/21 1214 02/20/21 0238  AST 26 26  ALT 22 22  ALKPHOS 53 48  BILITOT 1.9* 1.3*  PROT 5.7* 4.8*  ALBUMIN 3.5 3.0*   Recent Labs  Lab 02/18/21 1214  LIPASE 58*   No results for input(s): AMMONIA in the last 168 hours. Coagulation Profile: No results for input(s): INR, PROTIME in the last 168 hours. Cardiac Enzymes: No results for input(s): CKTOTAL, CKMB, CKMBINDEX, TROPONINI in  the last 168 hours. BNP (last 3 results) No results for input(s): PROBNP in the last 8760 hours. HbA1C: No results for input(s): HGBA1C in the last 72 hours. CBG: No results for input(s): GLUCAP in the last 168 hours. Lipid Profile: No results for input(s): CHOL, HDL, LDLCALC, TRIG, CHOLHDL, LDLDIRECT in the last 72 hours. Thyroid Function Tests: No results for input(s): TSH, T4TOTAL, FREET4, T3FREE, THYROIDAB in the last 72 hours. Anemia Panel: No results for input(s): VITAMINB12, FOLATE, FERRITIN, TIBC, IRON, RETICCTPCT in the last 72 hours. Urine analysis:    Component Value Date/Time   COLORURINE YELLOW 02/18/2021 2010   APPEARANCEUR CLEAR 02/18/2021 2010   LABSPEC 1.012 02/18/2021 2010   PHURINE 5.0 02/18/2021 2010   GLUCOSEU >=500 (A) 02/18/2021 2010   HGBUR SMALL (A) 02/18/2021 2010   BILIRUBINUR NEGATIVE 02/18/2021 2010   KETONESUR 5 (A) 02/18/2021 2010   PROTEINUR 30 (A) 02/18/2021 2010   UROBILINOGEN 1.0 05/24/2012 1330   NITRITE NEGATIVE 02/18/2021 2010   LEUKOCYTESUR NEGATIVE 02/18/2021 2010   Sepsis Labs: '@LABRCNTIP'$ (procalcitonin:4,lacticidven:4)  ) Recent Results (from the past 240 hour(s))  SARS CORONAVIRUS 2 (TAT 6-24 HRS) Nasopharyngeal Nasopharyngeal Swab     Status: None   Collection Time: 02/19/21  9:16 PM   Specimen: Nasopharyngeal Swab  Result Value Ref Range Status   SARS Coronavirus 2 NEGATIVE NEGATIVE Final    Comment: (NOTE) SARS-CoV-2 target nucleic acids are NOT DETECTED.  The SARS-CoV-2 RNA is generally detectable in upper and lower respiratory specimens during the acute phase of infection. Negative results do not preclude SARS-CoV-2 infection, do not rule out co-infections with other pathogens, and should not be used as the sole basis for treatment or other patient management decisions. Negative results must be combined with clinical observations, patient history, and epidemiological information. The expected result is Negative.  Fact  Sheet for Patients: SugarRoll.be  Fact Sheet for Healthcare Providers: https://www.woods-mathews.com/  This test is not yet approved or cleared by the [place] FDA and  has been authorized for detection and/or diagnosis of SARS-CoV-2 by FDA under an Emergency Use Authorization (EUA). This EUA will remain  in effect (meaning this test can be used) for the duration of the COVID-19 declaration under Se ction 564(b)(1) of the Act, 21 U.S.C. section 360bbb-3(b)(1), unless the authorization is terminated or revoked sooner.  Performed at [facility], [place] [address] [place], [place] [address]     Scheduled Meds:  apixaban  2.5 mg Oral BID   feeding supplement  237 mL Oral TID BM   finasteride  5 mg Oral Daily   furosemide  10 mg Oral Daily   senna-docusate  2 tablet Oral QHS   Continuous Infusions:   LOS: 0 days    Time spent: 23mn   PDomenic Polite MD [facility]   02/21/2021, 11:27 AM

## 2021-02-22 DIAGNOSIS — R627 Adult failure to thrive: Secondary | ICD-10-CM | POA: Diagnosis not present

## 2021-02-22 DIAGNOSIS — E43 Unspecified severe protein-calorie malnutrition: Secondary | ICD-10-CM | POA: Insufficient documentation

## 2021-02-22 DIAGNOSIS — R531 Weakness: Secondary | ICD-10-CM | POA: Diagnosis not present

## 2021-02-22 LAB — BASIC METABOLIC PANEL
Anion gap: 10 (ref 5–15)
BUN: 32 mg/dL — ABNORMAL HIGH (ref 8–23)
CO2: 26 mmol/L (ref 22–32)
Calcium: 9.3 mg/dL (ref 8.9–10.3)
Chloride: 105 mmol/L (ref 98–111)
Creatinine, Ser: 1.56 mg/dL — ABNORMAL HIGH (ref 0.61–1.24)
GFR, Estimated: 43 mL/min — ABNORMAL LOW (ref >60)
Glucose, Bld: 156 mg/dL — ABNORMAL HIGH (ref 70–99)
Potassium: 3.5 mmol/L (ref 3.5–5.1)
Sodium: 141 mmol/L (ref 135–145)

## 2021-02-22 MED ORDER — POLYETHYLENE GLYCOL 3350 17 G PO PACK: 17.0000 g | PACK | Freq: Every day | ORAL | Status: DC | PRN

## 2021-02-22 NOTE — Progress Notes (Signed)
Manufacturing engineer [facility]) Hospital Liaison Note  Visited patient at bedside. Resting in bed without distress.   Patient awaiting placement at [facility] facility.   Hospital liaison will continue to follow patient for discharge disposition and initiate hospice services at LTC facility.  Please do not hesitate to call with any hospice related questions.   Thank you,   Bobbie "Loren Racer, [place], BSN [facility] Liaison [phone]

## 2021-02-22 NOTE — TOC Initial Note (Signed)
Transition of Care [facility]) - Initial/Assessment Note    Patient Details  Name: Louis Reid MRN: [id number] Date of Birth: Jul 04, 1932  Transition of Care [facility]) CM/SW Contact:    Joanne Chars, LCSW Phone Number: 02/22/2021, 4:00 PM  Clinical Narrative:    CSW met with pt to discuss DC plan.  Pt very animated.  Pt reports he lives alone but was briefly at [facility]F prior to this admission.  Pt reports he is not going back to [facility].  Pt shares that he has long term care insurance with [facility]  and he needs it for admission to a long term care facility but it is being denied.  He is in process of fighting the denial.  Pt states he wants to DC home with assistance from his son until this is resolved and then enter a LTC facility.  Pt reports he is active with [facility] hospice and wants to continue with them.  Permission given to speak with son [name].    CSW spoke with son [name] on the phone.  Son does not believe the LTC policy is a denial but there is an evaluation that needs to happen prior to him being approved to use the policy and this evaluation cannot occur in the hospital.  Son reports he is not able to care for his father at home.  Pt was at [facility] for 4 days and had some sort of conflict with the director there.             Expected Discharge Plan:  (Long term care vs ALF) Barriers to Discharge:  (pending bed)   Patient Goals and CMS Choice Patient states their goals for this hospitalization and ongoing recovery are:: get home      Expected Discharge Plan and Services Expected Discharge Plan:  (Long term care vs ALF) In-house Referral: Clinical Social Work   Post Acute Care Choice: Nursing Home Living arrangements for the past 2 months: [place]                                      Prior Living Arrangements/Services Living arrangements for the past 2 months: Single Family Home Lives with:: Self Patient language and need for  interpreter reviewed:: Yes Do you feel safe going back to the place where you live?: No   Pt states he does not want to return to [facility]  Need for Family Participation in Patient Care: Yes (Comment) Care giver support system in place?: Yes (comment) Current home services: Hospice Criminal Activity/Legal Involvement Pertinent to Current Situation/Hospitalization: No - Comment as needed  Activities of Daily Living Home Assistive Devices/Equipment: None ADL Screening (condition at time of admission) Patient's cognitive ability adequate to safely complete daily activities?: Yes Is the patient deaf or have difficulty hearing?: No Does the patient have difficulty seeing, even when wearing glasses/contacts?: No Does the patient have difficulty concentrating, remembering, or making decisions?: No Patient able to express need for assistance with ADLs?: Yes Does the patient have difficulty dressing or bathing?: No Independently performs ADLs?: Yes (appropriate for developmental age) Does the patient have difficulty walking or climbing stairs?: Yes Weakness of Legs: None Weakness of Arms/Hands: None  Permission Sought/Granted Permission sought to share information with : Family Supports Permission granted to share information with : Yes, Verbal Permission Granted  Share Information with NAME: son [name]  Emotional Assessment Appearance:: Appears stated age Attitude/Demeanor/Rapport: Engaged Affect (typically observed): Appropriate, Pleasant Orientation: :  (Not recorded, appears oriented) Alcohol / Substance Use: Not Applicable Psych Involvement: No (comment)  Admission diagnosis:  Dehydration [E86.0] Failure to thrive in adult [R62.7] FTT (failure to thrive) in adult [R62.7] Generalized weakness [R53.1] Patient Active Problem List   Diagnosis Date Noted   Protein-calorie malnutrition, severe 02/22/2021   Failure to thrive in adult 02/18/2021   Generalized weakness     Palliative care encounter    Goals of care, counseling/discussion    Encounter for hospice care discussion    Heart failure ([place]) 02/28/2018   Decreased cardiac ejection fraction 01/10/2018   Nonspecific chest pain    Chronic combined systolic and diastolic heart failure ([place])    Unstable angina ([place]):  (01/20/2017) a. 3-v CAD w/ patent SVG to diag and patent sequential SVG to OM 2 and OM 3. Patent prox LAD stent. LIMA to LAD and SVG to RCA known occl. stable prox RCA sten  01/19/2017   Claudication ([place]) 01/22/2014   Chronic kidney disease 12/26/2013   ICD (implantable cardioverter-defibrillator) in place 02/16/2013   Ischemic cardiomyopathy 09/09/2012   Postop check 07/05/2012   STEMI (ST elevation myocardial infarction) ([place]) 05/18/2012   Anemia in chronic kidney disease(285.21) 05/17/2012   PE (pulmonary embolism) 05/17/2012   Physical deconditioning 05/17/2012   Anorexia 05/17/2012   Cardiogenic shock ([place]) 05/16/2012   Arterial hypotension 05/16/2012   Ventricular fibrillation arrest 05/10/2012   Cardiac arrest ([place]) 05/10/2012   Acute respiratory failure with hypoxia 2/2 cardiac arrest 05/10/2012   Acute encephalopathy 2/2 cardiac arrest 05/10/2012   Hyperglycemia 05/10/2012   Odynophagia 05/07/2012   Coronary artery disease 07/09/2011   History of coronary artery bypass surgery 07/09/2011   DYSPNEA 06/04/2010   HLD (hyperlipidemia) 04/17/2009   Essential hypertension 04/17/2009   Asymptomatic old MI (myocardial infarction) 04/17/2009   TOBACCO USE, QUIT 04/17/2009   PCP:  [name], MD Pharmacy:   [id number] [address], [place] [address] Phone: [phone] Fax: [phone]  [facility]  ([place], [place] [facility] [address] [place] [address] Phone: [phone] Fax: [phone]     Social Determinants of Health  (SDOH) Interventions    Readmission Risk Interventions No flowsheet data found.

## 2021-02-22 NOTE — Plan of Care (Signed)
  Problem: Clinical Measurements: Goal: Ability to maintain clinical measurements within normal limits will improve Outcome: Progressing Goal: Will remain free from infection Outcome: Progressing Goal: Cardiovascular complication will be avoided Outcome: Progressing   

## 2021-02-22 NOTE — Progress Notes (Signed)
PROGRESS NOTE    Louis Reid  [id number] DOB: 01/17/1933 DOA: 02/18/2021 PCP: Louis Downing, MD  Brief Narrative: 87 year old chronically ill elderly male with advanced cardiomyopathy, chronic systolic CHF, EF of 123456, ICD, CAD, hypertension, history of chronic anemia, history of Afib on eliquis presented from his facility with worsening generalized weakness, ongoing weight loss, poor p.o. intake. -He is followed by hospice at [facility]:   Adult failure to thrive Hypotension Generalized weakness, poor appetite, weight loss -I suspect this is secondary to his severe cardiomyopathy, cardiac cachexia  -Hypotension was also likely contributing, held losartan and Aldactone -Palliative care note from 9/8: ?recently referred to hospice -BP stable now, creatinine worsened and then stabilized -Intermittent nausea, p.o. intake remains poor -TOC/Hospice CSW consulted, disposition remains unclear, unsure if long-term care is an option versus back to ALF with more supervision and hospice services -Await input from social work team  Advanced cardiomyopathy, chronic systolic CHF EF 123456 with ICD -On low-dose Lasix 10 mg daily now, clinically appears euvolemic  AKi on CKD 3 a -Creatinine slightly worse -clinically euvolemic, BP better, ARB and Aldactone on hold -continue low dose lasix  H/o P.Afib on eliquis  Severe protein calorie malnutrition -advised protein supplements  BPH -Continue Proscar  Constipation, -Continue Senokot, add MiraLAX as needed.   DVT prophylaxis:eliquis Code Status: DNR Family Communication: No family at bedside, was finally able to reach patient's son Louis Reid after multiple attempts yesterday Disposition Plan:  Status is:  Inpatient level of care appropriate due to severity of illness, hemodynamic instability, unsafe discharge plan  Dispo: The patient is from: ALF              Anticipated d/c is to: TBD               Patient currently is not medically stable to d/c.   Difficult to place patient No Consultants:    Procedures:   Antimicrobials:    Subjective: -Feels fair, breathing okay, eating one third of his meals, still constipated Objective: Vitals:   02/21/21 1625 02/21/21 1951 02/22/21 0547 02/22/21 1029  BP: 112/73 107/72 (!) 83/53 101/76  Pulse: (!) 47 62 64 76  Resp: '19 18 18 18  '$ Temp: 98 F (36.7 C) 97.9 F (36.6 C) (!) 97.5 F (36.4 C) 98.1 F (36.7 C)  TempSrc: Oral Oral Oral Oral  SpO2: 94% 97% 99% 99%  Weight:      Height:        Intake/Output Summary (Last 24 hours) at 02/22/2021 1138 Last data filed at 02/22/2021 1030 Gross per 24 hour  Intake 240 ml  Output 600 ml  Net -360 ml   Filed Weights   02/20/21 0028  Weight: 50.2 kg    Examination:  General exam: Pleasant elderly male, laying in bed, AAOx3, no distress HEENT: No JVD Chest wall, anterior chest wall deformity noted  CVS: S1-S2, RRR lungs: Clear bilaterally  abdomen: soft, NT Ext: no edema Psych: Appropriate mood and affect  Data Reviewed:   CBC: Recent Labs  Lab 02/18/21 1214 02/20/21 0238  WBC 7.0 5.7  NEUTROABS 5.3  --   HGB 14.7 12.9*  HCT 44.6 38.6*  MCV 95.9 95.5  PLT 132* 99991111*   Basic Metabolic Panel: Recent Labs  Lab 02/18/21 1214 02/20/21 0238 02/21/21 0024 02/22/21 0157  NA 142 140 140 141  K 4.3 3.9 3.8 3.5  CL 111 109 107 105  CO2 20* 24 23 26  GLUCOSE 85 166* 115* 156*  BUN 34* 36* 37* 32*  CREATININE 1.91* 2.02* 1.80* 1.56*  CALCIUM 9.6 8.8* 9.4 9.3   GFR: Estimated Creatinine Clearance: 24.1 mL/min (A) (by C-G formula based on SCr of 1.56 mg/dL (H)). Liver Function Tests: Recent Labs  Lab 02/18/21 1214 02/20/21 0238  AST 26 26  ALT 22 22  ALKPHOS 53 48  BILITOT 1.9* 1.3*  PROT 5.7* 4.8*  ALBUMIN 3.5 3.0*   Recent Labs  Lab 02/18/21 1214  LIPASE 58*   No results for input(s): AMMONIA in the last 168 hours. Coagulation Profile: No results for  input(s): INR, PROTIME in the last 168 hours. Cardiac Enzymes: No results for input(s): CKTOTAL, CKMB, CKMBINDEX, TROPONINI in the last 168 hours. BNP (last 3 results) No results for input(s): PROBNP in the last 8760 hours. HbA1C: No results for input(s): HGBA1C in the last 72 hours. CBG: No results for input(s): GLUCAP in the last 168 hours. Lipid Profile: No results for input(s): CHOL, HDL, LDLCALC, TRIG, CHOLHDL, LDLDIRECT in the last 72 hours. Thyroid Function Tests: No results for input(s): TSH, T4TOTAL, FREET4, T3FREE, THYROIDAB in the last 72 hours. Anemia Panel: No results for input(s): VITAMINB12, FOLATE, FERRITIN, TIBC, IRON, RETICCTPCT in the last 72 hours. Urine analysis:    Component Value Date/Time   COLORURINE YELLOW 02/18/2021 2010   APPEARANCEUR CLEAR 02/18/2021 2010   LABSPEC 1.012 02/18/2021 2010   PHURINE 5.0 02/18/2021 2010   GLUCOSEU >=500 (A) 02/18/2021 2010   HGBUR SMALL (A) 02/18/2021 2010   BILIRUBINUR NEGATIVE 02/18/2021 2010   KETONESUR 5 (A) 02/18/2021 2010   PROTEINUR 30 (A) 02/18/2021 2010   UROBILINOGEN 1.0 05/24/2012 1330   NITRITE NEGATIVE 02/18/2021 2010   LEUKOCYTESUR NEGATIVE 02/18/2021 2010   Sepsis Labs: '@LABRCNTIP'$ (procalcitonin:4,lacticidven:4)  ) Recent Results (from the past 240 hour(s))  SARS CORONAVIRUS 2 (TAT 6-24 HRS) Nasopharyngeal Nasopharyngeal Swab     Status: None   Collection Time: 02/19/21  9:16 PM   Specimen: Nasopharyngeal Swab  Result Value Ref Range Status   SARS Coronavirus 2 NEGATIVE NEGATIVE Final    Comment: (NOTE) SARS-CoV-2 target nucleic acids are NOT DETECTED.  The SARS-CoV-2 RNA is generally detectable in upper and lower respiratory specimens during the acute phase of infection. Negative results do not preclude SARS-CoV-2 infection, do not rule out co-infections with other pathogens, and should not be used as the sole basis for treatment or other patient management decisions. Negative results must be  combined with clinical observations, patient history, and epidemiological information. The expected result is Negative.  Fact Sheet for Patients: SugarRoll.be  Fact Sheet for Healthcare Providers: https://www.woods-mathews.com/  This test is not yet approved or cleared by the [place] FDA and  has been authorized for detection and/or diagnosis of SARS-CoV-2 by FDA under an Emergency Use Authorization (EUA). This EUA will remain  in effect (meaning this test can be used) for the duration of the COVID-19 declaration under Se ction 564(b)(1) of the Act, 21 U.S.C. section 360bbb-3(b)(1), unless the authorization is terminated or revoked sooner.  Performed at [facility], [place] [address], [place], [place] [address]     Scheduled Meds:  apixaban  2.5 mg Oral BID   feeding supplement  237 mL Oral TID BM   finasteride  5 mg Oral Daily   furosemide  10 mg Oral Daily   multivitamin with minerals  1 tablet Oral Daily   senna-docusate  2 tablet Oral QHS   Continuous Infusions:  LOS: 1 day    Time spent: 54mn  PDomenic Polite MD [facility]   02/22/2021, 11:38 AM

## 2021-02-23 ENCOUNTER — Encounter (HOSPITAL_COMMUNITY): Payer: Self-pay | Admitting: Student in an Organized Health Care Education/Training Program

## 2021-02-23 DIAGNOSIS — R531 Weakness: Secondary | ICD-10-CM | POA: Diagnosis not present

## 2021-02-23 DIAGNOSIS — R627 Adult failure to thrive: Secondary | ICD-10-CM | POA: Diagnosis not present

## 2021-02-23 NOTE — Progress Notes (Signed)
PROGRESS NOTE    Louis Reid  [id number] DOB: Nov 11, 1932 DOA: 02/18/2021 PCP: [name], MD  Brief Narrative: 87 year old chronically ill elderly male with advanced cardiomyopathy, chronic systolic CHF, EF of 123456, ICD, CAD, hypertension, history of chronic anemia, history of Afib on eliquis presented from his facility with worsening generalized weakness, ongoing weight loss, poor p.o. intake. -He is followed by hospice at [facility]:   Adult failure to thrive Hypotension Generalized weakness, poor appetite, weight loss -I suspect this is secondary to his severe cardiomyopathy, cardiac cachexia  -Hypotension was also likely contributing, held losartan and Aldactone -Palliative care note from 9/8: recently referred to hospice -BP stable now, creatinine worsened and then stabilized -Intermittent nausea, p.o. intake remains poor -TOC/Hospice CSW consulted, needs ALF vs Long term SNF, refuses to go back to [place], in the interim considering going home and hoping his son will help for  a few days, son to stop by today and clarify/discuss with pt today -ToC following, Home tomorrow if stable  Advanced cardiomyopathy, chronic systolic CHF EF 123456 with ICD -On low-dose Lasix 10 mg daily now, clinically appears euvolemic  AKi on CKD 3 a -Creatinine slightly worse -clinically euvolemic, BP better, ARB and Aldactone on hold -continue low dose lasix  H/o P.Afib on eliquis  Severe protein calorie malnutrition -advised protein supplements  BPH -Continue Proscar  Constipation, -Continue Senokot, add MiraLAX as needed.   DVT prophylaxis:eliquis Code Status: DNR Family Communication: No family at bedside, was finally able to reach patient's son Louis Reid after multiple attempts 9/16 Disposition Plan:  Status is:  Inpatient level of care appropriate due to severity of illness, hemodynamic instability, unsafe discharge plan  Dispo: The  patient is from: ALF              Anticipated d/c is to: likely home with Hospice              Patient currently is not medically stable to d/c.   Difficult to place patient No Consultants:    Procedures:   Antimicrobials:    Subjective: -asking to stay in hospital for few days and ambulate a bit more before he can go home and then sort out ALF vs SNF after payor source details are ironed out  Objective: Vitals:   02/22/21 1536 02/22/21 2125 02/23/21 0400 02/23/21 0717  BP: 107/69 (!) 101/57 126/87 94/66  Pulse: 60 70 90 69  Resp: '16 18 18 18  '$ Temp: 97.8 F (36.6 C) 98.2 F (36.8 C) 97.6 F (36.4 C) 98.2 F (36.8 C)  TempSrc: Oral Oral Oral Oral  SpO2: 100% 100% 98% 99%  Weight:      Height:        Intake/Output Summary (Last 24 hours) at 02/23/2021 1045 Last data filed at 02/23/2021 0400 Gross per 24 hour  Intake 340 ml  Output 350 ml  Net -10 ml   Filed Weights   02/20/21 0028  Weight: 50.2 kg    Examination:  General exam: pleasant elderly male, laying in bed, AAOx3, no distress HEENt: no JVD Chest wall, anterior chest wall deformity noted  CVS: S1S2/RRR Lungs: clear bilaterally Abd: soft, NT, Bs present Ext: no edema Psych: Appropriate mood and affect  Data Reviewed:   CBC: Recent Labs  Lab 02/18/21 1214 02/20/21 0238  WBC 7.0 5.7  NEUTROABS 5.3  --   HGB 14.7 12.9*  HCT 44.6 38.6*  MCV 95.9 95.5  PLT 132* 116*  Basic Metabolic Panel: Recent Labs  Lab 02/18/21 1214 02/20/21 0238 02/21/21 0024 02/22/21 0157  NA 142 140 140 141  K 4.3 3.9 3.8 3.5  CL 111 109 107 105  CO2 20* '24 23 26  '$ GLUCOSE 85 166* 115* 156*  BUN 34* 36* 37* 32*  CREATININE 1.91* 2.02* 1.80* 1.56*  CALCIUM 9.6 8.8* 9.4 9.3   GFR: Estimated Creatinine Clearance: 24.1 mL/min (A) (by C-G formula based on SCr of 1.56 mg/dL (H)). Liver Function Tests: Recent Labs  Lab 02/18/21 1214 02/20/21 0238  AST 26 26  ALT 22 22  ALKPHOS 53 48  BILITOT 1.9* 1.3*  PROT  5.7* 4.8*  ALBUMIN 3.5 3.0*   Recent Labs  Lab 02/18/21 1214  LIPASE 58*   No results for input(s): AMMONIA in the last 168 hours. Coagulation Profile: No results for input(s): INR, PROTIME in the last 168 hours. Cardiac Enzymes: No results for input(s): CKTOTAL, CKMB, CKMBINDEX, TROPONINI in the last 168 hours. BNP (last 3 results) No results for input(s): PROBNP in the last 8760 hours. HbA1C: No results for input(s): HGBA1C in the last 72 hours. CBG: No results for input(s): GLUCAP in the last 168 hours. Lipid Profile: No results for input(s): CHOL, HDL, LDLCALC, TRIG, CHOLHDL, LDLDIRECT in the last 72 hours. Thyroid Function Tests: No results for input(s): TSH, T4TOTAL, FREET4, T3FREE, THYROIDAB in the last 72 hours. Anemia Panel: No results for input(s): VITAMINB12, FOLATE, FERRITIN, TIBC, IRON, RETICCTPCT in the last 72 hours. Urine analysis:    Component Value Date/Time   COLORURINE YELLOW 02/18/2021 2010   APPEARANCEUR CLEAR 02/18/2021 2010   LABSPEC 1.012 02/18/2021 2010   PHURINE 5.0 02/18/2021 2010   GLUCOSEU >=500 (A) 02/18/2021 2010   HGBUR SMALL (A) 02/18/2021 2010   BILIRUBINUR NEGATIVE 02/18/2021 2010   KETONESUR 5 (A) 02/18/2021 2010   PROTEINUR 30 (A) 02/18/2021 2010   UROBILINOGEN 1.0 05/24/2012 1330   NITRITE NEGATIVE 02/18/2021 2010   LEUKOCYTESUR NEGATIVE 02/18/2021 2010   Sepsis Labs: '@LABRCNTIP'$ (procalcitonin:4,lacticidven:4)  ) Recent Results (from the past 240 hour(s))  SARS CORONAVIRUS 2 (TAT 6-24 HRS) Nasopharyngeal Nasopharyngeal Swab     Status: None   Collection Time: 02/19/21  9:16 PM   Specimen: Nasopharyngeal Swab  Result Value Ref Range Status   SARS Coronavirus 2 NEGATIVE NEGATIVE Final    Comment: (NOTE) SARS-CoV-2 target nucleic acids are NOT DETECTED.  The SARS-CoV-2 RNA is generally detectable in upper and lower respiratory specimens during the acute phase of infection. Negative results do not preclude SARS-CoV-2 infection,  do not rule out co-infections with other pathogens, and should not be used as the sole basis for treatment or other patient management decisions. Negative results must be combined with clinical observations, patient history, and epidemiological information. The expected result is Negative.  Fact Sheet for Patients: SugarRoll.be  Fact Sheet for Healthcare Providers: https://www.woods-mathews.com/  This test is not yet approved or cleared by the [place] FDA and  has been authorized for detection and/or diagnosis of SARS-CoV-2 by FDA under an Emergency Use Authorization (EUA). This EUA will remain  in effect (meaning this test can be used) for the duration of the COVID-19 declaration under Se ction 564(b)(1) of the Act, 21 U.S.C. section 360bbb-3(b)(1), unless the authorization is terminated or revoked sooner.  Performed at [facility], [place] [address], [place], [place] [address]     Scheduled Meds:  apixaban  2.5 mg Oral BID   feeding supplement  237 mL Oral TID BM  finasteride  5 mg Oral Daily   furosemide  10 mg Oral Daily   multivitamin with minerals  1 tablet Oral Daily   senna-docusate  2 tablet Oral QHS   Continuous Infusions:   LOS: 2 days    Time spent: 48mn  PDomenic Polite MD [facility]   02/23/2021, 10:45 AM

## 2021-02-23 NOTE — Evaluation (Signed)
Physical Therapy Re-Evaluation Patient Details Name: Louis Reid MRN: [id number] DOB: Jan 07, 1933 Today's Date: 02/23/2021  History of Present Illness  Pt is an 87 y/o male admitted from ALF on 9/13 secondary to increased weakness. PMH includes HTN, CAD, PE, s/p ICD, and CHF.  Clinical Impression  PT reconsulted as patient wants to go home prior to ALF to settle financial situation.  Patient presents with dependencies in transfers and gait due to generalized weakness, decreased balance and decreased endurance.  Overall, patient min assist for mobility, short distances.  Agree with no long term support at home, patient would benefit from ALF.  Patient plans to return home with son for a few days to organize financial situation (has long term care insurance and needs to work with them to pay for ALF).  Will benefit from PT while in hospital to maximize potential for safe stay at home.  Will need intermittent supervision at home.       Recommendations for follow up therapy are one component of a multi-disciplinary discharge planning process, led by the attending physician.  Recommendations may be updated based on patient status, additional functional criteria and insurance authorization.  Follow Up Recommendations Home health PT (ALF)    Equipment Recommendations  Rolling walker with 5" wheels;Wheelchair (measurements PT);Wheelchair cushion (measurements PT)    Recommendations for Other Services OT consult     Precautions / Restrictions Precautions Precautions: Fall      Mobility  Bed Mobility Overal bed mobility: Modified Independent Bed Mobility: Supine to Sit     Supine to sit: Modified independent (Device/Increase time)     General bed mobility comments: used railing and head of bed elevated to get to EOB    Transfers Overall transfer level: Needs assistance Equipment used: Rolling walker (2 wheeled) Transfers: Sit to/from Stand Sit to Stand: Min assist          General transfer comment: min assist for lift assist and steadying  Ambulation/Gait Ambulation/Gait assistance: Min assist Gait Distance (Feet): 40 Feet Assistive device: Rolling walker (2 wheeled) Gait Pattern/deviations: Step-through pattern Gait velocity: decreased      Stairs            Wheelchair Mobility    Modified Rankin (Stroke Patients Only)       Balance Overall balance assessment: Needs assistance Sitting-balance support: No upper extremity supported;Feet supported Sitting balance-Leahy Scale: Good     Standing balance support: Bilateral upper extremity supported;During functional activity Standing balance-Leahy Scale: Poor Standing balance comment: reliant on RW for support                             Pertinent Vitals/Pain Pain Assessment: No/denies pain    Home Living Family/patient expects to be discharged to:: Private residence (came from ALF, wants to go to a different ALF; may need to go home first) Living Arrangements: Children (son can stay for a few days) Available Help at Discharge: Family Type of Home: House Home Access: Stairs to enter Entrance Stairs-Rails: Right;Left;Can reach both Entrance Stairs-Number of Steps: 5 Home Layout: Able to live on main level with bedroom/bathroom;Two level Home Equipment: Wheelchair - [employer] - 2 wheels      Prior Function Level of Independence: Needs assistance   Gait / Transfers Assistance Needed: Reports he used WC for longer distance mobility to dining hall and staff assisted with propulsion. Was only able to ambulate a few feet before becoming very fatigued.  ADL's /  Homemaking Assistance Needed: Reports he needed assist with ADLs, but reports he did not feel staff provided necessary assist.        Hand Dominance        Extremity/Trunk Assessment   Upper Extremity Assessment Upper Extremity Assessment: Generalized weakness         Cervical / Trunk  Assessment Cervical / Trunk Assessment: Kyphotic  Communication   Communication: No difficulties  Cognition Arousal/Alertness: Awake/alert Behavior During Therapy: WFL for tasks assessed/performed Overall Cognitive Status: Within Functional Limits for tasks assessed                                 General Comments: Pt very anxious about current situation. REports he feels financially and mentally strained. Reports multiple times he does not want to go back to his current facility.      General Comments      Exercises     Assessment/Plan    PT Assessment Patient needs continued PT services  PT Problem List Decreased strength;Decreased activity tolerance;Decreased balance;Decreased mobility;Cardiopulmonary status limiting activity       PT Treatment Interventions Gait training;Functional mobility training;Therapeutic activities;Therapeutic exercise;Balance training;Patient/family education    PT Goals (Current goals can be found in the Care Plan section)  Acute Rehab PT Goals Patient Stated Goal: get home to get my case opened for ALF PT Goal Formulation: With patient Time For Goal Achievement: 03/02/21 Potential to Achieve Goals: Good    Frequency Min 3X/week   Barriers to discharge        Co-evaluation               AM-PAC PT "6 Clicks" Mobility  Outcome Measure Help needed turning from your back to your side while in a flat bed without using bedrails?: A Little Help needed moving from lying on your back to sitting on the side of a flat bed without using bedrails?: A Little Help needed moving to and from a bed to a chair (including a wheelchair)?: A Little Help needed standing up from a chair using your arms (e.g., wheelchair or bedside chair)?: A Little Help needed to walk in hospital room?: A Little Help needed climbing 3-5 steps with a railing? : A Lot 6 Click Score: 17    End of Session Equipment Utilized During Treatment: Gait  belt Activity Tolerance: Patient tolerated treatment well Patient left: in chair;with chair alarm set;with call bell/phone within reach   PT Visit Diagnosis: Unsteadiness on feet (R26.81);Muscle weakness (generalized) (M62.81)    Time: [id number] PT Time Calculation (min) (ACUTE ONLY): 29 min   Charges:   PT Evaluation $PT Re-evaluation: 1 Re-eval PT Treatments $Gait Training: 8-22 mins        02/23/2021 Margie, PT Acute Rehabilitation Services Pager:  [phone] Office:  [place], [place] 02/23/2021, 2:33 PM

## 2021-02-23 NOTE — TOC Progression Note (Signed)
Transition of Care [facility]) - Progression Note    Patient Details  Name: TAYLOR BRODERSEN MRN: [id number] Date of Birth: [birth year]  [facility]) CM/SW Contact  Joanne Chars, LCSW Phone Number: 02/23/2021, 10:37 AM  Clinical Narrative: CSW spoke with pt in room and discussed options.  Pt reports that he knows his long term choice is between a nursing home and a ALF but his preference is ALF.  CSW discussed that CSW can make referrals for LTC at nursing home from the hospital but cannot make ALF referral.  Pt aware and said he wants to see an ALF facility before he commits anyways.  Discussed what to do in the mean time between hospital and finding an ALF, pt maintains he wants to go home.  CSW brought up temporarily back to [place] and pt said absolutely not.  Pt believes he can manage at home temporarily until his insurance issue is resolved and he finds ALF with support of his son.  Hospice also involved at home.    CSW spoke with son [name], updated him, emailed him copy of ALF listing.  Son reports he will support and can stay with pt for several days but not indefinitely.    MD updated.      Expected Discharge Plan:  (Long term care vs ALF) Barriers to Discharge:  (pending bed)  Expected Discharge Plan and Services Expected Discharge Plan:  (Long term care vs ALF) In-house Referral: Clinical Social Work   Post Acute Care Choice: Nursing Home Living arrangements for the past 2 months: Single Family Home                                       Social Determinants of Health (SDOH) Interventions    Readmission Risk Interventions No flowsheet data found.

## 2021-02-24 DIAGNOSIS — R627 Adult failure to thrive: Secondary | ICD-10-CM | POA: Diagnosis not present

## 2021-02-24 DIAGNOSIS — R531 Weakness: Secondary | ICD-10-CM | POA: Diagnosis not present

## 2021-02-24 LAB — BASIC METABOLIC PANEL
Anion gap: 11 (ref 5–15)
BUN: 29 mg/dL — ABNORMAL HIGH (ref 8–23)
CO2: 26 mmol/L (ref 22–32)
Calcium: 9.1 mg/dL (ref 8.9–10.3)
Chloride: 102 mmol/L (ref 98–111)
Creatinine, Ser: 1.55 mg/dL — ABNORMAL HIGH (ref 0.61–1.24)
GFR, Estimated: 43 mL/min — ABNORMAL LOW (ref >60)
Glucose, Bld: 139 mg/dL — ABNORMAL HIGH (ref 70–99)
Potassium: 3.8 mmol/L (ref 3.5–5.1)
Sodium: 139 mmol/L (ref 135–145)

## 2021-02-24 LAB — CBC
HCT: 40.6 % (ref 39.0–52.0)
Hemoglobin: 13.3 g/dL (ref 13.0–17.0)
MCH: 31.7 pg (ref 26.0–34.0)
MCHC: 32.8 g/dL (ref 30.0–36.0)
MCV: 96.7 fL (ref 80.0–100.0)
Platelets: 116 10*3/uL — ABNORMAL LOW (ref 150–400)
RBC: 4.2 MIL/uL — ABNORMAL LOW (ref 4.22–5.81)
RDW: 14.5 % (ref 11.5–15.5)
WBC: 6.1 10*3/uL (ref 4.0–10.5)
nRBC: 0 % (ref 0.0–0.2)

## 2021-02-24 MED ORDER — DOCUSATE SODIUM 100 MG PO CAPS: 200.0000 mg | ORAL_CAPSULE | Freq: Every day | ORAL | 0 refills | Status: DC

## 2021-02-24 NOTE — TOC Progression Note (Addendum)
Transition of Care [facility]) - Progression Note    Patient Details  Name: Louis Reid MRN: [id number] Date of Birth: Mar 20, 1933  Transition of Care [facility]) CM/SW Contact  Jacalyn Lefevre Edson Snowball, RN Phone Number: 02/24/2021, 10:14 AM  Clinical Narrative:    See previous TOC note. NCM spoke to patient at bedside. Patient confirmed he is going home at [place]. Confirmed face sheet address and phone.   Patient states he was active with [facility] for hospice then palliative and now they are following him under hospice.   NCM left voicemail for Audrea Muscat with [facility].   PT recommending walker and wheel chair. Patient already has a walker and states his home is to small for a wheel chair , once he "gets insurance straight" and moves to an ALF he will get a wheelchair. NCM explained hospice can order wheel chair under his hospice benefit. Patient voiced understanding.   Patient states he just spoke with his son on the phone. Son lives out of town and is on his way to the hospital today. Son plans to stay with patient while they work on ALF.     Spoke to Audrea Muscat with [facility], patient is active with them for palliative care but has been approved for hospice. If patient discharges today they will admit to hospice tomorrow. Audrea Muscat aware of above, their social worker can also assist patient with placement.   Expected Discharge Plan:  (Long term care vs ALF) Barriers to Discharge:  (pending bed)  Expected Discharge Plan and Services Expected Discharge Plan:  (Long term care vs ALF) In-house Referral: Clinical Social Work   Post Acute Care Choice: Nursing Home Living arrangements for the past 2 months: Single Family Home                                       Social Determinants of Health (SDOH) Interventions    Readmission Risk Interventions No flowsheet data found.

## 2021-02-24 NOTE — Progress Notes (Signed)
Physical Therapy Treatment Patient Details Name: Louis Reid MRN: [id number] DOB: August 01, 1932 Today's Date: 02/24/2021   History of Present Illness 87 y/o male admitted from ALF on 9/13 secondary to increased weakness. PMH includes HTN, CAD, PE, s/p ICD, and CHF.    PT Comments    Pt was seen for mobility on RW in his room, due to feeling tired and unable to push too much.  He requested to return to bed and did review a bit of LE exercise to help with mobility.  Pt is perseverating on his LTC insurance, reporting it is not set up for him to get to ALF yet.  Will continue to pursue greater walking distances within his tolerance, but per pt is under the care of hospice.  Follow along for gait and strengthening within reasonable medical limits.   Recommendations for follow up therapy are one component of a multi-disciplinary discharge planning process, led by the attending physician.  Recommendations may be updated based on patient status, additional functional criteria and insurance authorization.  Follow Up Recommendations  Home health PT     Equipment Recommendations  Rolling walker with 5" wheels;Wheelchair (measurements PT);Wheelchair cushion (measurements PT)    Recommendations for Other Services OT consult     Precautions / Restrictions Precautions Precautions: Fall Restrictions Weight Bearing Restrictions: No     Mobility  Bed Mobility Overal bed mobility: Modified Independent Bed Mobility: Supine to Sit;Sit to Supine     Supine to sit: Supervision          Transfers Overall transfer level: Needs assistance Equipment used: Rolling walker (2 wheeled) Transfers: Sit to/from Stand Sit to Stand: Min guard         General transfer comment: to get control of initial standing balance  Ambulation/Gait Ambulation/Gait assistance: Min guard Gait Distance (Feet): 40 Feet Assistive device: Rolling walker (2 wheeled) Gait Pattern/deviations: Step-through  pattern Gait velocity: decreased   General Gait Details: pt used RW for stability and note his mildly impulsive turning and direction changes   Stairs             Wheelchair Mobility    Modified Rankin (Stroke Patients Only)       Balance Overall balance assessment: Needs assistance Sitting-balance support: Feet supported Sitting balance-Leahy Scale: Good     Standing balance support: Bilateral upper extremity supported;During functional activity Standing balance-Leahy Scale: Fair Standing balance comment: less than fair dynamically                            Cognition Arousal/Alertness: Awake/alert Behavior During Therapy: WFL for tasks assessed/performed Overall Cognitive Status: Within Functional Limits for tasks assessed                                 General Comments: pt is functionally not too limited but discussion about LTC insurance was most of the conversation today      Exercises General Exercises - Lower Extremity Ankle Circles/Pumps: AAROM;5 reps Quad Sets: AROM;10 reps    General Comments General comments (skin integrity, edema, etc.): pt was assisted to side of bed with pt taking the initiative to get there.  requires a lot of redirection for his distraction of LTC insurance not being set up yet for ALF      Pertinent Vitals/Pain Pain Assessment: Faces Pain Score: 0-No pain    Home Living  Prior Function            PT Goals (current goals can now be found in the care plan section) Acute Rehab PT Goals Patient Stated Goal: get home to get my case opened for ALF Progress towards PT goals: Progressing toward goals    Frequency    Min 3X/week      PT Plan Current plan remains appropriate    Co-evaluation              AM-PAC PT "6 Clicks" Mobility   Outcome Measure  Help needed turning from your back to your side while in a flat bed without using bedrails?: A  Little Help needed moving from lying on your back to sitting on the side of a flat bed without using bedrails?: A Little Help needed moving to and from a bed to a chair (including a wheelchair)?: A Little Help needed standing up from a chair using your arms (e.g., wheelchair or bedside chair)?: A Little Help needed to walk in hospital room?: A Little Help needed climbing 3-5 steps with a railing? : A Little 6 Click Score: 18    End of Session Equipment Utilized During Treatment: Gait belt Activity Tolerance: Patient tolerated treatment well Patient left: in chair;with chair alarm set;with call bell/phone within reach Nurse Communication: Mobility status PT Visit Diagnosis: Unsteadiness on feet (R26.81);Muscle weakness (generalized) (M62.81)     Time: [id number] PT Time Calculation (min) (ACUTE ONLY): 18 min  Charges:  $Gait Training: 8-22 mins             Ramond Dial 02/24/2021, 4:34 PM  Mee Hives, PT MS Acute Rehab Dept. Number: [place] [facility] [place]

## 2021-02-24 NOTE — Discharge Summary (Signed)
Physician Discharge Summary  Louis Reid LOV:[id number] DOB: 03/07/1933 DOA: 02/18/2021  PCP: [name], MD  Admit date: 02/18/2021 Discharge date: 02/24/2021  Time spent: 35 minutes  Recommendations for Outpatient Follow-up:  Discharge home with hospice services, patient plans to eventually transition to ALF versus long-term care SNF after his long-term care insurance with [employer] is sorted out   Discharge Diagnoses:  Active Problems:   Failure to thrive in adult   Protein-calorie malnutrition, severe Chronic diastolic CHF AKI on CKD 3 A Paroxysmal atrial fibrillation on Eliquis BPH History of constipation  Discharge Condition: Stable  Diet recommendation: Low-sodium, heart healthy  Filed Weights   02/20/21 0028  Weight: 50.2 kg    History of present illness:  87 year old chronically ill elderly male with advanced cardiomyopathy, chronic systolic CHF, EF of 88%, ICD, CAD, hypertension, history of chronic anemia, history of Afib on eliquis presented from his facility with worsening generalized weakness, ongoing weight loss, poor p.o. intake.  He was recently referred to hospice  Hospital Course:   Adult failure to thrive Hypotension Generalized weakness, poor appetite, weight loss -I suspect this is secondary to his severe cardiomyopathy, cardiac cachexia  -Hypotension was also likely contributing, held losartan and Aldactone this admission -Palliative care note from 9/8: recently referred to hospice -BP stable now, creatinine worsened and then stabilized -P.o. intake generally poor but overall improved -TOC/Hospice CSW consulted, ideally needs ALF vs Long term SNF, refuses to go back to [facility], in the interim considering going home with hospice services with help from his son, eventually will need ALF versus long-term SNF once his long-term care insurance/payer source details are ironed out    Advanced cardiomyopathy, chronic systolic CHF EF  41% with ICD -On low-dose Lasix 10 mg daily now, clinically appears euvolemic -Continue same, losartan and Aldactone discontinued on account of hypotension contributing to weakness, failure to thrive   AKi on CKD 3 a -Creatinine worsened this admission, blood pressure was soft as well initially -clinically euvolemic, BP better now, ARB and Aldactone stopped -continue low dose lasix   H/o P.Afib on eliquis   Severe protein calorie malnutrition -advised protein supplements   BPH -Continue Proscar   Constipation, -Continue Senokot, add MiraLAX as needed.   Goals of care: DNR, previously followed by hospice and then palliative care, back to hospice now, will restart hospice services at home  Discharge Exam: Vitals:   02/24/21 0847 02/24/21 0922  BP: 93/68 107/72  Pulse: 83   Resp: 16   Temp: 97.9 F (36.6 C)   SpO2: 99%     General: AAOx3, no distress Cardiovascular: S1-S2, regular rate rhythm Respiratory: Clear  Discharge Instructions    Allergies as of 02/24/2021   No Known Allergies      Medication List     STOP taking these medications    empagliflozin 10 MG Tabs tablet Commonly known as: Jardiance   losartan 25 MG tablet Commonly known as: COZAAR   spironolactone 25 MG tablet Commonly known as: ALDACTONE       TAKE these medications    apixaban 2.5 MG Tabs tablet Commonly known as: Eliquis Take 1 tablet (2.5 mg total) by mouth 2 (two) times daily.   cholecalciferol 1000 units tablet Commonly known as: VITAMIN D Take 2,000 Units by mouth daily.   docusate sodium 100 MG capsule Commonly known as: COLACE Take 100 mg by mouth 2 (two) times daily.   feeding supplement Liqd Take 237 mLs by mouth 3 (  three) times daily between meals.   finasteride 5 MG tablet Commonly known as: PROSCAR Take 5 mg by mouth daily.   furosemide 20 MG tablet Commonly known as: LASIX TAKE 1 TABLET BY MOUTH EVERY DAY What changed: how much to take   Iron 325  (65 Fe) MG Tabs Take 65 mg by mouth at bedtime.   nitroGLYCERIN 0.4 MG SL tablet Commonly known as: NITROSTAT Place 1 tablet (0.4 mg total) under the tongue every 5 (five) minutes x 3 doses as needed for chest pain. Take only as needed for chest pain if your blood pressure is greater than 100/80.   rosuvastatin 20 MG tablet Commonly known as: CRESTOR Take 1 tablet (20 mg total) by mouth daily.       No Known Allergies  Follow-up Information     [facility] Follow up.   Specialty: Hospice and Palliative Medicine Contact information: [place] [phone]                 The results of significant diagnostics from this hospitalization (including imaging, microbiology, ancillary and laboratory) are listed below for reference.    Significant Diagnostic Studies: DG Chest Port 1 View  Result Date: 02/18/2021 CLINICAL DATA:  Shortness of breath. EXAM: PORTABLE CHEST 1 VIEW COMPARISON:  February 28, 2018. FINDINGS: The heart size and mediastinal contours are within normal limits. Status post coronary bypass graft. Left-sided defibrillator is unchanged in position. Both lungs are clear. The visualized skeletal structures are unremarkable. IMPRESSION: No active disease. Electronically Signed   By: Marijo Conception M.D.   On: 02/18/2021 12:14    Microbiology: Recent Results (from the past 240 hour(s))  SARS CORONAVIRUS 2 (TAT 6-24 HRS) Nasopharyngeal Nasopharyngeal Swab     Status: None   Collection Time: 02/19/21  9:16 PM   Specimen: Nasopharyngeal Swab  Result Value Ref Range Status   SARS Coronavirus 2 NEGATIVE NEGATIVE Final    Comment: (NOTE) SARS-CoV-2 target nucleic acids are NOT DETECTED.  The SARS-CoV-2 RNA is generally detectable in upper and lower respiratory specimens during the acute phase of infection. Negative results do not preclude SARS-CoV-2 infection, do not rule out co-infections with other pathogens, and  should not be used as the sole basis for treatment or other patient management decisions. Negative results must be combined with clinical observations, patient history, and epidemiological information. The expected result is Negative.  Fact Sheet for Patients: SugarRoll.be  Fact Sheet for Healthcare Providers: https://www.woods-mathews.com/  This test is not yet approved or cleared by the [place] FDA and  has been authorized for detection and/or diagnosis of SARS-CoV-2 by FDA under an Emergency Use Authorization (EUA). This EUA will remain  in effect (meaning this test can be used) for the duration of the COVID-19 declaration under Se ction 564(b)(1) of the Act, 21 U.S.C. section 360bbb-3(b)(1), unless the authorization is terminated or revoked sooner.  Performed at [facility], [place] [address], [place], [place] [address]      Labs: Basic Metabolic Panel: Recent Labs  Lab 02/18/21 1214 02/20/21 0238 02/21/21 0024 02/22/21 0157 02/24/21 0121  NA 142 140 140 141 139  K 4.3 3.9 3.8 3.5 3.8  CL 111 109 107 105 102  CO2 20* 24 23 26 26   GLUCOSE 85 166* 115* 156* 139*  BUN 34* 36* 37* 32* 29*  CREATININE 1.91* 2.02* 1.80* 1.56* 1.55*  CALCIUM 9.6 8.8* 9.4 9.3 9.1   Liver Function Tests: Recent Labs  Lab 02/18/21 1214 02/20/21 0238  AST 26 26  ALT 22 22  ALKPHOS 53 48  BILITOT 1.9* 1.3*  PROT 5.7* 4.8*  ALBUMIN 3.5 3.0*   Recent Labs  Lab 02/18/21 1214  LIPASE 58*   No results for input(s): AMMONIA in the last 168 hours. CBC: Recent Labs  Lab 02/18/21 1214 02/20/21 0238 02/24/21 0121  WBC 7.0 5.7 6.1  NEUTROABS 5.3  --   --   HGB 14.7 12.9* 13.3  HCT 44.6 38.6* 40.6  MCV 95.9 95.5 96.7  PLT 132* 116* 116*   Cardiac Enzymes: No results for input(s): CKTOTAL, CKMB, CKMBINDEX, TROPONINI in the last 168 hours. BNP: BNP (last 3 results) No results for input(s): BNP in the last 8760 hours.  ProBNP  (last 3 results) No results for input(s): PROBNP in the last 8760 hours.  CBG: No results for input(s): GLUCAP in the last 168 hours.     Signed:  Domenic Polite MD.  [facility] 02/24/2021, 11:51 AM

## 2021-02-24 NOTE — Care Management Important Message (Signed)
Important Message  Patient Details  Name: Louis Reid MRN: [id number] Date of Birth: 07/31/1932   Medicare Important Message Given:  Yes     Memory Argue 02/24/2021, 1:27 PM

## 2021-02-24 NOTE — Care Management Important Message (Signed)
Important Message  Patient Details  Name: Louis Reid MRN: [id number] Date of Birth: [birth year]   Medicare Important Message Given:  Yes     Orbie Pyo 02/24/2021, 4:05 PM

## 2021-02-24 NOTE — Evaluation (Signed)
Occupational Therapy Evaluation Patient Details Name: Louis Reid DOBIN MRN: [id number] DOB: [birth year] Today's Date: 02/24/2021   History of Present Illness Pt is an 87 y/o male admitted from ALF on 9/13 secondary to increased weakness. PMH includes HTN, CAD, PE, s/p ICD, and CHF.   Clinical Impression   PTA, pt was living at an ALF and needing assistance with ADLs. Pt not forthcoming with how much specific assistance he needed with ADLs when asked, he did report using a RW for functional mobility. Pt has a son who plans to stay with him post-d/c (information from chart review). Pt currently requiring set up-supervision for UB ADLs, Min A for LB ADLs, and Min guard for functional mobility with RW. Pt with decreased endurance, functional strength, and balance limiting ability to complete ADLs safely and independently. Pt would benefit from continued OT in the acute setting to address occupational performance problems. Pt planning to discharge home with hospice care. Recommending HHOT with 24 hr supervision assistance upon d/c home due to current level of function and to address home safety needs. Will continue to follow in the acute setting.     Recommendations for follow up therapy are one component of a multi-disciplinary discharge planning process, led by the attending physician.  Recommendations may be updated based on patient status, additional functional criteria and insurance authorization.   Follow Up Recommendations  Home Health OT;24 hour supervision assistance.   Equipment Recommendations  None recommended by OT    Recommendations for Other Services       Precautions / Restrictions Precautions Precautions: Fall Restrictions Weight Bearing Restrictions: No      Mobility Bed Mobility Overal bed mobility: Modified Independent Bed Mobility: Supine to Sit     Supine to sit: Modified independent (Device/Increase time) Sit to supine: Modified independent (Device/Increase time)    General bed mobility comments: used railing and head of bed elevated to get to EOB    Transfers Overall transfer level: Needs assistance Equipment used: Rolling walker (2 wheeled) Transfers: Sit to/from Stand Sit to Stand: Min guard         General transfer comment: Min guard for safety    Balance Overall balance assessment: Needs assistance Sitting-balance support: No upper extremity supported;Feet supported Sitting balance-Leahy Scale: Good Sitting balance - Comments: Pt able to sit EOB in static sitting with no UE support   Standing balance support: Bilateral upper extremity supported;During functional activity Standing balance-Leahy Scale: Poor Standing balance comment: reliant on RW for support                           ADL either performed or assessed with clinical judgement   ADL Overall ADL's : Needs assistance/impaired Eating/Feeding: Modified independent   Grooming: Sitting;Set up;Wash/dry face Grooming Details (indicate cue type and reason): Pt required set up to retreive wet wash cloth to wash face in sitting Upper Body Bathing: Set up;Sitting   Lower Body Bathing: Sit to/from stand;Minimal assistance   Upper Body Dressing : Set up;Sitting   Lower Body Dressing: Minimal assistance;Sit to/from stand   Toilet Transfer: Min guard;RW;Cueing for Office manager Details (indicate cue type and reason): Pt required min guard for safety and min verbal cues to wait until therapist was ready and adusted gait belt Toileting- Clothing Manipulation and Hygiene: Min guard;Sit to/from stand   Tub/ Shower Transfer: Tub transfer;Ambulation;Rolling walker   Functional mobility during ADLs: Min guard;Rolling walker General ADL Comments: Pt with decreased activity tolerance  and functional strength limiting ability to complete ADLs safely and independently.     Vision Baseline Vision/History: 1 Wears glasses Ability to See in Adequate Light: 1  Impaired Patient Visual Report: No change from baseline Vision Assessment?: Yes Additional Comments: Pt wears glasses for "long distances only"     Perception     Praxis      Pertinent Vitals/Pain Pain Assessment: Faces Faces Pain Scale: No hurt     Hand Dominance Right   Extremity/Trunk Assessment Upper Extremity Assessment Upper Extremity Assessment: Generalized weakness   Lower Extremity Assessment Lower Extremity Assessment: Defer to PT evaluation   Cervical / Trunk Assessment Cervical / Trunk Assessment: Kyphotic   Communication Communication Communication: No difficulties   Cognition Arousal/Alertness: Awake/alert Behavior During Therapy: WFL for tasks assessed/performed Overall Cognitive Status: Within Functional Limits for tasks assessed                                 General Comments: Pt perseverating on topics and repeating self during conversation, possible age-appropriate cognitive deficits.   General Comments       Exercises     Shoulder Instructions      Home Living Family/patient expects to be discharged to:: Private residence Living Arrangements: Children Available Help at Discharge: Family Type of Home: House Home Access: Stairs to enter Technical brewer of Steps: 5 Entrance Stairs-Rails: Right;Left;Can reach both Home Layout: Able to live on main level with bedroom/bathroom;Two level Alternate Level Stairs-Number of Steps: 14 Alternate Level Stairs-Rails: Right Bathroom Shower/Tub: Teacher, early years/pre: Standard Bathroom Accessibility: Yes   Home Equipment: Wheelchair - manual;Walker - 2 wheels   Additional Comments: Requesting not to return back to his current ALF.      Prior Functioning/Environment Level of Independence: Needs assistance    ADL's / Homemaking Assistance Needed: Reports he needed assist with ADLs, but reports he did not feel staff provided necessary assist.    Comments: Pt not  forthcoming when asked about how much assistance was needed with bathing and dressing. Reports he was using a RW at [facility].        OT Problem List: Decreased strength;Decreased activity tolerance;Impaired balance (sitting and/or standing);Decreased knowledge of use of DME or AE      OT Treatment/Interventions: Self-care/ADL training;Therapeutic exercise;DME and/or AE instruction;Therapeutic activities;Patient/family education    OT Goals(Current goals can be found in the care plan section) Acute Rehab OT Goals Patient Stated Goal: get home to get my case opened for ALF OT Goal Formulation: With patient Time For Goal Achievement: 03/10/21 Potential to Achieve Goals: Good  OT Frequency: Min 2X/week   Barriers to D/C:         Co-evaluation              AM-PAC OT "6 Clicks" Daily Activity     Outcome Measure Help from another person eating meals?: None Help from another person taking care of personal grooming?: A Little Help from another person toileting, which includes using toliet, bedpan, or urinal?: A Little Help from another person bathing (including washing, rinsing, drying)?: A Little Help from another person to put on and taking off regular upper body clothing?: A Little Help from another person to put on and taking off regular lower body clothing?: A Little 6 Click Score: 19   End of Session Equipment Utilized During Treatment: Gait belt;Rolling walker Nurse Communication: Mobility status  Activity Tolerance: Patient tolerated treatment  well Patient left: in chair;with chair alarm set;with call bell/phone within reach  OT Visit Diagnosis: Unsteadiness on feet (R26.81);Muscle weakness (generalized) (M62.81)                Time: [id number] OT Time Calculation (min): 19 min Charges:  OT General Charges $OT Visit: 1 Visit OT Evaluation $OT Eval Low Complexity: 1 Low  Jackquline Denmark, OTS Acute Rehab Office: [phone]   Iann Rodier 02/24/2021, 12:53 PM

## 2021-02-24 NOTE — Plan of Care (Signed)
  Problem: Education: Goal: Knowledge of General Education information will improve Description Including pain rating scale, medication(s)/side effects and non-pharmacologic comfort measures Outcome: Progressing   

## 2021-02-25 MED ORDER — ONDANSETRON 4 MG PO TBDP: 4.0000 mg | ORAL_TABLET | Freq: Three times a day (TID) | ORAL | 0 refills | Status: DC | PRN

## 2021-02-25 NOTE — Progress Notes (Signed)
Discharge instructions (including medications) discussed with and copy provided to patient/caregiver 

## 2021-02-25 NOTE — Plan of Care (Signed)
  Problem: Acute Rehab PT Goals(only PT should resolve) Goal: Patient Will Transfer Sit To/From Stand Outcome: Adequate for Discharge Goal: Pt Will Ambulate Outcome: Adequate for Discharge Goal: Pt/caregiver will Perform Home Exercise Program Outcome: Adequate for Discharge

## 2021-03-03 ENCOUNTER — Other Ambulatory Visit (HOSPITAL_COMMUNITY): Payer: Medicare Other

## 2021-03-03 ENCOUNTER — Telehealth (HOSPITAL_COMMUNITY): Payer: Self-pay | Admitting: Cardiology

## 2021-03-03 NOTE — Telephone Encounter (Signed)
Patient called and cancelled echocardiogram for reason below:  03/03/2021 9:25 AM LT:KCXWN, SELENA  Cancel Rsn: Patient (states he is moving into a nursiing home, and has a lot going on)  Order will be removed from the echo WQ and if patient calls back to reschedule we will reinstate the order. Thank you

## 2021-03-10 ENCOUNTER — Encounter (HOSPITAL_COMMUNITY): Payer: Medicare Other | Admitting: Internal Medicine

## 2021-03-11 ENCOUNTER — Emergency Department

## 2021-03-11 ENCOUNTER — Other Ambulatory Visit: Payer: Self-pay

## 2021-03-11 ENCOUNTER — Encounter: Payer: Self-pay | Admitting: Radiology

## 2021-03-11 ENCOUNTER — Inpatient Hospital Stay
Admission: EM | Admit: 2021-03-11 | Discharge: 2021-03-15 | DRG: 641 | Disposition: A | Discharge status: Skilled Nursing Facility | Attending: Internal Medicine | Admitting: Internal Medicine

## 2021-03-11 DIAGNOSIS — Z20822 Contact with and (suspected) exposure to covid-19: Secondary | ICD-10-CM | POA: Diagnosis present

## 2021-03-11 DIAGNOSIS — D649 Anemia, unspecified: Secondary | ICD-10-CM | POA: Diagnosis present

## 2021-03-11 DIAGNOSIS — Z8674 Personal history of sudden cardiac arrest: Secondary | ICD-10-CM

## 2021-03-11 DIAGNOSIS — K5904 Chronic idiopathic constipation: Secondary | ICD-10-CM | POA: Diagnosis not present

## 2021-03-11 DIAGNOSIS — Z800 Family history of malignant neoplasm of digestive organs: Secondary | ICD-10-CM

## 2021-03-11 DIAGNOSIS — R627 Adult failure to thrive: Secondary | ICD-10-CM | POA: Diagnosis present

## 2021-03-11 DIAGNOSIS — R54 Age-related physical debility: Secondary | ICD-10-CM | POA: Diagnosis present

## 2021-03-11 DIAGNOSIS — I495 Sick sinus syndrome: Secondary | ICD-10-CM | POA: Diagnosis present

## 2021-03-11 DIAGNOSIS — Z862 Personal history of diseases of the blood and blood-forming organs and certain disorders involving the immune mechanism: Secondary | ICD-10-CM

## 2021-03-11 DIAGNOSIS — E860 Dehydration: Secondary | ICD-10-CM | POA: Diagnosis present

## 2021-03-11 DIAGNOSIS — K5641 Fecal impaction: Secondary | ICD-10-CM | POA: Diagnosis present

## 2021-03-11 DIAGNOSIS — N1832 Chronic kidney disease, stage 3b: Secondary | ICD-10-CM | POA: Diagnosis present

## 2021-03-11 DIAGNOSIS — R64 Cachexia: Secondary | ICD-10-CM | POA: Diagnosis present

## 2021-03-11 DIAGNOSIS — Z789 Other specified health status: Secondary | ICD-10-CM | POA: Diagnosis present

## 2021-03-11 DIAGNOSIS — Z681 Body mass index (BMI) 19 or less, adult: Secondary | ICD-10-CM

## 2021-03-11 DIAGNOSIS — I130 Hypertensive heart and chronic kidney disease with heart failure and stage 1 through stage 4 chronic kidney disease, or unspecified chronic kidney disease: Secondary | ICD-10-CM | POA: Diagnosis present

## 2021-03-11 DIAGNOSIS — I5022 Chronic systolic (congestive) heart failure: Secondary | ICD-10-CM | POA: Diagnosis present

## 2021-03-11 DIAGNOSIS — I5042 Chronic combined systolic (congestive) and diastolic (congestive) heart failure: Secondary | ICD-10-CM | POA: Diagnosis present

## 2021-03-11 DIAGNOSIS — I2510 Atherosclerotic heart disease of native coronary artery without angina pectoris: Secondary | ICD-10-CM | POA: Diagnosis present

## 2021-03-11 DIAGNOSIS — Z85828 Personal history of other malignant neoplasm of skin: Secondary | ICD-10-CM

## 2021-03-11 DIAGNOSIS — E46 Unspecified protein-calorie malnutrition: Secondary | ICD-10-CM | POA: Diagnosis present

## 2021-03-11 DIAGNOSIS — I255 Ischemic cardiomyopathy: Secondary | ICD-10-CM | POA: Diagnosis present

## 2021-03-11 DIAGNOSIS — Z66 Do not resuscitate: Secondary | ICD-10-CM | POA: Diagnosis present

## 2021-03-11 DIAGNOSIS — Z7901 Long term (current) use of anticoagulants: Secondary | ICD-10-CM

## 2021-03-11 DIAGNOSIS — Z86711 Personal history of pulmonary embolism: Secondary | ICD-10-CM

## 2021-03-11 DIAGNOSIS — M810 Age-related osteoporosis without current pathological fracture: Secondary | ICD-10-CM | POA: Diagnosis present

## 2021-03-11 DIAGNOSIS — I252 Old myocardial infarction: Secondary | ICD-10-CM

## 2021-03-11 DIAGNOSIS — K760 Fatty (change of) liver, not elsewhere classified: Secondary | ICD-10-CM | POA: Diagnosis present

## 2021-03-11 DIAGNOSIS — D735 Infarction of spleen: Secondary | ICD-10-CM | POA: Diagnosis not present

## 2021-03-11 DIAGNOSIS — I25810 Atherosclerosis of coronary artery bypass graft(s) without angina pectoris: Secondary | ICD-10-CM | POA: Diagnosis present

## 2021-03-11 DIAGNOSIS — Z955 Presence of coronary angioplasty implant and graft: Secondary | ICD-10-CM

## 2021-03-11 DIAGNOSIS — I700 Atherosclerosis of aorta: Secondary | ICD-10-CM | POA: Diagnosis present

## 2021-03-11 DIAGNOSIS — I480 Paroxysmal atrial fibrillation: Secondary | ICD-10-CM | POA: Diagnosis present

## 2021-03-11 DIAGNOSIS — K7689 Other specified diseases of liver: Secondary | ICD-10-CM | POA: Diagnosis present

## 2021-03-11 DIAGNOSIS — Z8249 Family history of ischemic heart disease and other diseases of the circulatory system: Secondary | ICD-10-CM

## 2021-03-11 DIAGNOSIS — K5900 Constipation, unspecified: Secondary | ICD-10-CM | POA: Diagnosis present

## 2021-03-11 DIAGNOSIS — I10 Essential (primary) hypertension: Secondary | ICD-10-CM | POA: Diagnosis present

## 2021-03-11 DIAGNOSIS — Z95810 Presence of automatic (implantable) cardiac defibrillator: Secondary | ICD-10-CM | POA: Diagnosis not present

## 2021-03-11 DIAGNOSIS — Z87442 Personal history of urinary calculi: Secondary | ICD-10-CM

## 2021-03-11 DIAGNOSIS — R531 Weakness: Secondary | ICD-10-CM | POA: Diagnosis present

## 2021-03-11 DIAGNOSIS — E785 Hyperlipidemia, unspecified: Secondary | ICD-10-CM | POA: Diagnosis present

## 2021-03-11 DIAGNOSIS — K769 Liver disease, unspecified: Secondary | ICD-10-CM

## 2021-03-11 LAB — URINALYSIS, COMPLETE (UACMP) WITH MICROSCOPIC
Bilirubin Urine: NEGATIVE
Glucose, UA: NEGATIVE mg/dL
Hgb urine dipstick: NEGATIVE
Ketones, ur: NEGATIVE mg/dL
Nitrite: NEGATIVE
Protein, ur: 30 mg/dL — AB
Specific Gravity, Urine: 1.012 (ref 1.005–1.030)
pH: 5 (ref 5.0–8.0)

## 2021-03-11 LAB — BASIC METABOLIC PANEL
Anion gap: 9 (ref 5–15)
BUN: 36 mg/dL — ABNORMAL HIGH (ref 8–23)
CO2: 27 mmol/L (ref 22–32)
Calcium: 9.7 mg/dL (ref 8.9–10.3)
Chloride: 105 mmol/L (ref 98–111)
Creatinine, Ser: 1.74 mg/dL — ABNORMAL HIGH (ref 0.61–1.24)
GFR, Estimated: 38 mL/min — ABNORMAL LOW (ref >60)
Glucose, Bld: 94 mg/dL (ref 70–99)
Potassium: 3.5 mmol/L (ref 3.5–5.1)
Sodium: 141 mmol/L (ref 135–145)

## 2021-03-11 LAB — HEPATIC FUNCTION PANEL
ALT: 24 U/L (ref 0–44)
AST: 30 U/L (ref 15–41)
Albumin: 4.1 g/dL (ref 3.5–5.0)
Alkaline Phosphatase: 82 U/L (ref 38–126)
Bilirubin, Direct: 0.3 mg/dL — ABNORMAL HIGH (ref 0.0–0.2)
Indirect Bilirubin: 1 mg/dL — ABNORMAL HIGH (ref 0.3–0.9)
Total Bilirubin: 1.3 mg/dL — ABNORMAL HIGH (ref 0.3–1.2)
Total Protein: 6.7 g/dL (ref 6.5–8.1)

## 2021-03-11 LAB — CBC
HCT: 43.2 % (ref 39.0–52.0)
Hemoglobin: 15 g/dL (ref 13.0–17.0)
MCH: 33.3 pg (ref 26.0–34.0)
MCHC: 34.7 g/dL (ref 30.0–36.0)
MCV: 95.8 fL (ref 80.0–100.0)
Platelets: 144 10*3/uL — ABNORMAL LOW (ref 150–400)
RBC: 4.51 MIL/uL (ref 4.22–5.81)
RDW: 14.9 % (ref 11.5–15.5)
WBC: 7.5 10*3/uL (ref 4.0–10.5)
nRBC: 0 % (ref 0.0–0.2)

## 2021-03-11 LAB — RESP PANEL BY RT-PCR (FLU A&B, COVID) ARPGX2
Influenza A by PCR: NEGATIVE
Influenza B by PCR: NEGATIVE
SARS Coronavirus 2 by RT PCR: NEGATIVE

## 2021-03-11 LAB — TROPONIN I (HIGH SENSITIVITY)
Troponin I (High Sensitivity): 29 ng/L — ABNORMAL HIGH (ref <18)
Troponin I (High Sensitivity): 28 ng/L — ABNORMAL HIGH (ref <18)

## 2021-03-11 MED ORDER — ROSUVASTATIN CALCIUM 20 MG PO TABS
20.0000 mg | ORAL_TABLET | Freq: Every day | ORAL | Status: DC
  Filled 2021-03-11: qty 1

## 2021-03-11 MED ORDER — IOHEXOL 350 MG/ML SOLN
50.0000 mL | Freq: Once | INTRAVENOUS | Status: AC | PRN
  Administered 2021-03-11: 50 mL via INTRAVENOUS

## 2021-03-11 MED ORDER — ACETAMINOPHEN 650 MG RE SUPP
650.0000 mg | Freq: Four times a day (QID) | RECTAL | Status: DC | PRN
Start: 2021-03-11 — End: 2021-03-12

## 2021-03-11 MED ORDER — ONDANSETRON HCL 4 MG/2ML IJ SOLN: 4.0000 mg | Freq: Four times a day (QID) | INTRAMUSCULAR | Status: DC | PRN

## 2021-03-11 MED ORDER — ONDANSETRON HCL 4 MG PO TABS: 4.0000 mg | ORAL_TABLET | Freq: Four times a day (QID) | ORAL | Status: DC | PRN

## 2021-03-11 MED ORDER — ENSURE ENLIVE PO LIQD
237.0000 mL | Freq: Three times a day (TID) | ORAL | Status: DC
  Administered 2021-03-12 – 2021-03-15 (×8): 237 mL via ORAL

## 2021-03-11 MED ORDER — LACTATED RINGERS IV BOLUS
500.0000 mL | Freq: Once | INTRAVENOUS | Status: AC
  Administered 2021-03-11: 500 mL via INTRAVENOUS

## 2021-03-11 MED ORDER — ONDANSETRON HCL 4 MG PO TABS: 4.0000 mg | ORAL_TABLET | Freq: Three times a day (TID) | ORAL | 0 refills | Status: DC | PRN

## 2021-03-11 MED ORDER — APIXABAN 2.5 MG PO TABS
2.5000 mg | ORAL_TABLET | Freq: Two times a day (BID) | ORAL | Status: DC
  Administered 2021-03-11 – 2021-03-15 (×8): 2.5 mg via ORAL
  Filled 2021-03-11 (×9): qty 1

## 2021-03-11 MED ORDER — SENNA 8.6 MG PO TABS
1.0000 | ORAL_TABLET | Freq: Every evening | ORAL | Status: DC | PRN
  Administered 2021-03-11: 8.6 mg via ORAL
  Filled 2021-03-11: qty 1

## 2021-03-11 MED ORDER — ACETAMINOPHEN 325 MG PO TABS
650.0000 mg | ORAL_TABLET | Freq: Four times a day (QID) | ORAL | Status: DC | PRN
  Administered 2021-03-12 – 2021-03-14 (×4): 650 mg via ORAL
  Filled 2021-03-11 (×4): qty 2

## 2021-03-11 MED ORDER — LIDOCAINE HCL URETHRAL/MUCOSAL 2 % EX GEL
1.0000 "application " | Freq: Once | CUTANEOUS | Status: DC
  Filled 2021-03-11: qty 10

## 2021-03-11 MED ORDER — POLYETHYLENE GLYCOL 3350 17 G PO PACK
17.0000 g | PACK | Freq: Every day | ORAL | Status: DC
  Administered 2021-03-11: 17 g via ORAL
  Filled 2021-03-11: qty 1

## 2021-03-11 MED ORDER — BISACODYL 10 MG RE SUPP
10.0000 mg | Freq: Every day | RECTAL | Status: DC | PRN
  Filled 2021-03-11: qty 1

## 2021-03-11 NOTE — ED Triage Notes (Signed)
Pt brought to th ER via family from home, pt states that he has had worsening weakness, lack of appetite, no energy, nausea. Pt reports constipation for the past few days. Pt is under hospice care for advanced heart failure. Pt states that he can't even drink water which is abnormal for him, pt denies any chest pain

## 2021-03-11 NOTE — H&P (Signed)
History and Physical    LEGION DISCHER UXN:[id number] DOB: January 14, 1933 DOA: 03/11/2021  PCP: [name], MD   Patient coming from: home  I have personally briefly reviewed patient's old medical records in [place]  Chief Complaint: weakness  HPI: Louis Reid is a 87 y.o. male with medical history significant for Chronically ill male with CAD, advanced ischemic cardiomyopathy, EF 20% s/p ICD, PAF on Eliquis, CKD 3B,  protein calorie malnutrition with failure to thrive and chronic constipation, hospitalized from 9/13-9/19 during which he had a palliative care consult and discharged home with hospice where he lives alone who presents with protracted weakness, inability to manage on his own at home with shortness of breath on minimal exertion and inadequate nutrition due to his symptoms.  Patient states that ideally he would like to go to SNF however review of discharge summary reveals that he had difficulty with placement during his recent stay.  He denies chest pain, nausea vomiting, cough fever or chills.  His main complaint is constipation stating he has not had a bowel movement in several days.  He denies falls.  ED course: On arrival vitals within normal limits except for mild bradycardia of 50 Blood work mostly unremarkable and at baseline.  Creatinine 1.74 which is baseline.  He did have mild bilirubin elevation of 1.3.  Troponin 29/28.  CBC and BMP unremarkable  EKG, personally viewed and interpreted: NSR at 89 with no acute ST-T wave changes  Imaging: CT abdomen and pelvis with marked hepatic steatosis, left hepatic lobe lesions, suggestion of splenic infarct, 8 cm rectal stool ball  The ED provider offered stool disimpaction but patient declined opting to try oral laxatives first.  He received an IV fluid bolus.  Hospitalist consulted for admission.  Review of Systems: As per HPI otherwise all other systems on review of systems negative.    Past Medical History:   Diagnosis Date   AICD (automatic cardioverter/defibrillator) present 09/13/2012   Anemia    CAD (coronary artery disease)    a. s/p remote CABG, b. AL STEMI c/b VF arrest in 05/2012 => LHC 05/10/12: Severe 3v CAD, S-OM1/2 ok, SVG-Dx ok, SVG-RCA occluded, LIMA-LAD atretic, proximal LAD occluded. EF 20% with anterior apical HK=> salvage PCI with POBA and thrombectomy of the occluded LAD;  c. 01/2017: atretic LIMA_LAD and known occluded SVG-RCA. Patent LAD stent and 70% proxRCA stenosis (FFR 0.88).    Chronic systolic heart failure ([place])    a. Echo 12/13: EF 20-25%, anteroseptal, inferior, apical HK, mildly reduced RV function, trivial pericardial effusion;   b.  Echo 3/14:  Ant, septal, apical AK, EF 25%   Clotting disorder ([place])    Depression    "years ago" (02/28/2018)   History of kidney stones    HLD (hyperlipidemia)    Hypertension    "I've never had high blood pressure; on RX for other reasons" (02/28/2018)   Inguinal hernia    "have one now on my left side" (09/13/2012); (02/28/2018)   Ischemic cardiomyopathy    Myocardial infarction [facility]) 1996;  05/2012   Osteoporosis    Paroxysmal atrial fibrillation ([place]) 03/17/2016   noted on device interrogation,  will follow burden   Pectus excavatum    Pulmonary embolism ([place])    a. after adhesiolysis for SBO in 04/2012 => coumadin for 6 mos   Renal cyst    SBO (small bowel obstruction) ([place]) November 2013   s/p lysis of adhesions   Sinoatrial node dysfunction ([place])  Skin cancer    "left ear; face" (02/28/2018)   Ventricular fibrillation ([place]) 05/2012   . in setting of AL STEMI 12/13 => EF 20-25% => d/c on LifeVest (repeat echo planned in 08/2012)    Past Surgical History:  Procedure Laterality Date   CATARACT EXTRACTION W/ INTRAOCULAR LENS  IMPLANT, BILATERAL Bilateral    COLONOSCOPY     CORONARY ANGIOPLASTY WITH STENT PLACEMENT     "I've got 2 or 3 stents" (02/28/2018)   [place]   "CABG X5" (09/13/2012)    CYSTOSCOPY/RETROGRADE/URETEROSCOPY/STONE EXTRACTION WITH BASKET  1990's   ESOPHAGOGASTRODUODENOSCOPY  05/08/2012   Procedure: ESOPHAGOGASTRODUODENOSCOPY (EGD);  Surgeon: Juanita Craver, MD;  Location: [facility] ENDOSCOPY;  Service: Endoscopy;  Laterality: N/A;  Nevin Bloodgood put on for Dr. Collene Mares , Dr. Collene Mares will call if she wants another time   IMPLANTABLE CARDIOVERTER DEFIBRILLATOR IMPLANT N/A 09/13/2012   SJM Ellipse ER implanted by Dr Rayann Heman for secondary prevention   INGUINAL HERNIA REPAIR Right 1996   INTRAVASCULAR PRESSURE WIRE/FFR STUDY N/A 01/20/2017   Procedure: INTRAVASCULAR PRESSURE WIRE/FFR STUDY;  Surgeon: Wellington Hampshire, MD;  Location: [place] CV LAB;  Service: Cardiovascular;  Laterality: N/A;   LAPAROTOMY  04/27/2012   Procedure: EXPLORATORY LAPAROTOMY;  Surgeon: Gwenyth Ober, MD;  Location: [place];  Service: General;  Laterality: N/A;   LEFT HEART CATH AND CORS/GRAFTS ANGIOGRAPHY N/A 01/20/2017   Procedure: LEFT HEART CATH AND CORS/GRAFTS ANGIOGRAPHY;  Surgeon: Wellington Hampshire, MD;  Location: [place] CV LAB;  Service: Cardiovascular;  Laterality: N/A;   LEFT HEART CATHETERIZATION WITH CORONARY ANGIOGRAM N/A 05/10/2012   Procedure: LEFT HEART CATHETERIZATION WITH CORONARY ANGIOGRAM;  Surgeon: Burnell Blanks, MD;  Location[facility] CATH LAB;  Service: Cardiovascular;  Laterality: N/A;   PTCA     percutaneous transluminal coronary intervention and brachy therapy, Bruce R. Olevia Perches, MD. EF 60%   RIGHT HEART CATH N/A 03/01/2018   Procedure: RIGHT HEART CATH;  Surgeon: Jolaine Artist, MD;  Location: [place] CV LAB;  Service: Cardiovascular;  Laterality: N/A;   SKIN CANCER EXCISION     "left ear; face" (02/28/2018)   TRANSURETHRAL RESECTION OF PROSTATE       reports that he has quit smoking. His smoking use included cigarettes. He has never used smokeless tobacco. He reports that he does not drink alcohol and does not use drugs.  No Known Allergies  Family History  Problem Relation  Age of Onset   Heart disease Father    Other Mother        brain tumor   Colon cancer Brother    Cancer Brother        Liver   Heart disease Brother       Prior to Admission medications   Medication Sig Start Date End Date Taking? Authorizing Provider  ondansetron (ZOFRAN) 4 MG tablet Take 1 tablet (4 mg total) by mouth every 8 (eight) hours as needed for up to 10 doses for nausea or vomiting. 03/11/21  Yes Lucrezia Starch, MD  apixaban (ELIQUIS) 2.5 MG TABS tablet Take 1 tablet (2.5 mg total) by mouth 2 (two) times daily. 07/09/20   Bensimhon, Shaune Pascal, MD  cholecalciferol (VITAMIN D) 1000 units tablet Take 2,000 Units by mouth daily.    [provider]  docusate sodium (COLACE) 100 MG capsule Take 2 capsules (200 mg total) by mouth at bedtime. 02/24/21   Domenic Polite, MD  feeding supplement, ENSURE ENLIVE, (ENSURE ENLIVE) LIQD Take  237 mLs by mouth 3 (three) times daily between meals. Patient not taking: No sig reported 03/03/18   Georgiana Shore, NP  Ferrous Sulfate (IRON) 325 (65 Fe) MG TABS Take 65 mg by mouth at bedtime.    [provider]  finasteride (PROSCAR) 5 MG tablet Take 5 mg by mouth daily. 08/30/19   [provider]  furosemide (LASIX) 20 MG tablet TAKE 1 TABLET BY MOUTH EVERY DAY Patient taking differently: Take 10 mg by mouth daily. 01/20/21   Lelon Perla, MD  nitroGLYCERIN (NITROSTAT) 0.4 MG SL tablet Place 1 tablet (0.4 mg total) under the tongue every 5 (five) minutes x 3 doses as needed for chest pain. Take only as needed for chest pain if your blood pressure is greater than 100/80. 01/22/17   Carlota Raspberry, Tiffany, [name]  rosuvastatin (CRESTOR) 20 MG tablet Take 1 tablet (20 mg total) by mouth daily. 04/08/20 02/18/21  Lelon Perla, MD    Physical Exam: Vitals:   03/11/21 1520 03/11/21 1521 03/11/21 1827 03/11/21 1902  BP:  (!) 147/79  125/72  Pulse:  95 69 (!) 53  Resp:  16 18 18   Temp:      TempSrc:      SpO2: 96% 98% 99% 98%   Weight:      Height:         Vitals:   03/11/21 1520 03/11/21 1521 03/11/21 1827 03/11/21 1902  BP:  (!) 147/79  125/72  Pulse:  95 69 (!) 53  Resp:  16 18 18   Temp:      TempSrc:      SpO2: 96% 98% 99% 98%  Weight:      Height:          Constitutional: Alert and oriented x 3 . Not in any apparent distress HEENT:      Head: Normocephalic and atraumatic.         Eyes: PERLA, EOMI, Conjunctivae are normal. Sclera is non-icteric.       Mouth/Throat: Mucous membranes are moist.       Neck: Supple with no signs of meningismus. Cardiovascular: Regular rate and rhythm. No murmurs, gallops, or rubs. 2+ symmetrical distal pulses are present . No JVD. No LE edema Respiratory: Respiratory effort normal .Lungs sounds clear bilaterally. No wheezes, crackles, or rhonchi.  Gastrointestinal: Soft, non tender, and non distended with positive bowel sounds.  Genitourinary: No CVA tenderness. Musculoskeletal: Nontender with normal range of motion in all extremities. No cyanosis, or erythema of extremities. Neurologic:  Face is symmetric. Moving all extremities. No gross focal neurologic deficits . Skin: Skin is warm, dry.  No rash or ulcers Psychiatric: Mood and affect are normal    Labs on Admission: I have personally reviewed following labs and imaging studies  CBC: Recent Labs  Lab 03/11/21 1347  WBC 7.5  HGB 15.0  HCT 43.2  MCV 95.8  PLT 297*   Basic Metabolic Panel: Recent Labs  Lab 03/11/21 1347  NA 141  K 3.5  CL 105  CO2 27  GLUCOSE 94  BUN 36*  CREATININE 1.74*  CALCIUM 9.7   GFR: Estimated Creatinine Clearance: 20.5 mL/min (A) (by C-G formula based on SCr of 1.74 mg/dL (H)). Liver Function Tests: Recent Labs  Lab 03/11/21 1512  AST 30  ALT 24  ALKPHOS 82  BILITOT 1.3*  PROT 6.7  ALBUMIN 4.1   No results for input(s): LIPASE, AMYLASE in the last 168 hours. No results for input(s): AMMONIA in the  last 168 hours. Coagulation Profile: No results for  input(s): INR, PROTIME in the last 168 hours. Cardiac Enzymes: No results for input(s): CKTOTAL, CKMB, CKMBINDEX, TROPONINI in the last 168 hours. BNP (last 3 results) No results for input(s): PROBNP in the last 8760 hours. HbA1C: No results for input(s): HGBA1C in the last 72 hours. CBG: No results for input(s): GLUCAP in the last 168 hours. Lipid Profile: No results for input(s): CHOL, HDL, LDLCALC, TRIG, CHOLHDL, LDLDIRECT in the last 72 hours. Thyroid Function Tests: No results for input(s): TSH, T4TOTAL, FREET4, T3FREE, THYROIDAB in the last 72 hours. Anemia Panel: No results for input(s): VITAMINB12, FOLATE, FERRITIN, TIBC, IRON, RETICCTPCT in the last 72 hours. Urine analysis:    Component Value Date/Time   COLORURINE YELLOW (A) 03/11/2021 1347   APPEARANCEUR HAZY (A) 03/11/2021 1347   LABSPEC 1.012 03/11/2021 1347   PHURINE 5.0 03/11/2021 1347   GLUCOSEU NEGATIVE 03/11/2021 1347   HGBUR NEGATIVE 03/11/2021 1347   BILIRUBINUR NEGATIVE 03/11/2021 1347   KETONESUR NEGATIVE 03/11/2021 1347   PROTEINUR 30 (A) 03/11/2021 1347   UROBILINOGEN 1.0 05/24/2012 1330   NITRITE NEGATIVE 03/11/2021 1347   LEUKOCYTESUR TRACE (A) 03/11/2021 1347    Radiological Exams on Admission: CT ABDOMEN PELVIS W CONTRAST  Result Date: 03/11/2021 CLINICAL DATA:  Abdominal pain, acute, nonlocalized. constipation EXAM: CT ABDOMEN AND PELVIS WITH CONTRAST TECHNIQUE: Multidetector CT imaging of the abdomen and pelvis was performed using the standard protocol following bolus administration of intravenous contrast. CONTRAST:  62mL OMNIPAQUE IOHEXOL 350 MG/ML SOLN COMPARISON:  CT abdomen pelvis 06/08/2019. FINDINGS: Lower chest: No acute abnormality. Hepatobiliary: The hepatic parenchyma is diffusely hypodense compared to the splenic parenchyma consistent with fatty infiltration. Heterogeneity of the left hepatic lobe with at least a couple of hyperdense lesions measuring 2.2 and 0.9 cm (2:25). No gallstones,  gallbladder wall thickening, or pericholecystic fluid. No biliary dilatation. Pancreas: No focal lesion. Normal pancreatic contour. No surrounding inflammatory changes. No main pancreatic ductal dilatation. Spleen: Couple of wedge-shaped hypodense peripheral densities within the splenic parenchyma suggestive of splenic infarctions. A splenule is noted. Adrenals/Urinary Tract: No adrenal nodule bilaterally. Bilateral kidneys enhance symmetrically. Bilateral renal cortical scarring. Mild prominence of the left collecting system. No right hydronephrosis. No hydroureter. The urinary bladder is unremarkable. Stomach/Bowel: Stomach is within normal limits. No evidence of bowel wall thickening or dilatation. Rectal stool ball measuring up to 8 cm. Remainder of the colon is under distended and difficult to evaluate. Appendix appears normal. Vascular/Lymphatic: The main portal, splenic, superior mesenteric veins are patent. No abdominal aorta or iliac aneurysm. Severe atherosclerotic plaque of the aorta and its branches. No abdominal, pelvic, or inguinal lymphadenopathy. Reproductive: Prominent prostate measuring 4.5 cm. Other: Trace volume free fluid within the pelvis. No intraperitoneal free gas. No organized fluid collection. Musculoskeletal: No abdominal wall hernia or abnormality. No suspicious lytic or blastic osseous lesions. No acute displaced fracture. Dextroscoliosis centered at the L1-L2 level. IMPRESSION: 1. Marked hepatic steatosis with heterogeneity of the left hepatic lobe with at least a couple of hepatic lesions measuring 2.2 and 0.9 cm. Recommend nonemergent MRI liver protocol for further evaluation. When the patient is clinically stable and able to follow directions and hold their breath (preferably as an outpatient) further evaluation with dedicated abdominal MRI should be considered. 2. Suggestion of splenic infarction. 3. An 8 cm rectal stool ball with no definite findings of stercoral colitis.  Remainder of the colon is under distended and difficult to evaluate. 4. Mild prominence of the left  collecting system of unclear etiology. 5. Prominent prostate. 6.  Aortic Atherosclerosis (ICD10-I70.0). Electronically Signed   By: Iven Finn M.D.   On: 03/11/2021 16:32     Assessment/Plan 87 year old male male with CAD, advanced ischemic cardiomyopathy, EF 20% s/p ICD, PAF on Eliquis, CKD 3B,  protein calorie malnutrition, recently hospitalized, presenting with protracted weakness, inability to manage on his own at home     Generalized weakness   Unable to care for self   Failure to thrive in adult/protein calorie malnutrition -Generalized weakness likely related to chronic medical conditions, malnutrition and physical deconditioning. - No stigmata of infection or acute exacerbation of chronic medical conditions seen on work-up - Patient unable to manage at home where he lives alone and has preference for SNF placement versus home with hospice - PT OT dietary and TOC consult    Constipation - 8 cm rectal stool ball seen on CT abdomen and pelvis - Scheduled and as needed laxatives - Patient declined manual disimpaction offered by ED provider  Abnormal CT abdomen and pelvis -Left hepatic lobe lesions, suggestion of splenic infarct - Patient already on Eliquis regarding splenic infarct - Given advanced age and frailty, uncertain benefit of pursuing diagnostic investigation, and if so, cardiology clearance will be needed.  Coronary artery disease Chronic combined systolic and diastolic heart failure  Ischemic cardiomyopathy s/p AICD - All chronic and stable - Continue rosuvastatin, nitroglycerin, Lasix    Paroxysmal atrial fibrillation (HCC) - Continue apixaban.  Not currently on rate control agents    Stage 3b chronic kidney disease (HCC) - Chronic and stable    Essential hypertension - Chronic and stable    DVT prophylaxis: Eliquis Code Status: DNR  Family Communication:   none  Disposition Plan: Back to previous home environment Consults called: none  Status:At the time of admission, it appears that the appropriate admission status for this patient is INPATIENT. This is judged to be reasonable and necessary in order to provide the required intensity of service to ensure the patient's safety given the presenting symptoms, physical exam findings, and initial radiographic and laboratory data in the context of their  Comorbid conditions.   Patient requires inpatient status due to high intensity of service, high risk for further deterioration and high frequency of surveillance required.   I certify that at the point of admission it is my clinical judgment that the patient will require inpatient hospital care spanning beyond [place] MD [facility]     03/11/2021, 9:17 PM

## 2021-03-11 NOTE — ED Notes (Signed)
This RN and Tamala Julian, MD started to prepare for fecal disimpaction. Pt refused and stated he wanted to try laxative first.

## 2021-03-11 NOTE — ED Provider Notes (Signed)
[facility] Emergency Department Provider Note  ____________________________________________   Event Date/Time   First MD Initiated Contact with Patient 03/11/21 1456     (approximate)  I have reviewed the triage vital signs and the nursing notes.   HISTORY  Chief Complaint Weakness and Constipation   HPI Louis Reid is a 87 y.o. male advanced cardiomyopathy, chronic systolic CHF, EF of 46%, ICD, CAD, hypertension, history of chronic anemia, history of Afib on eliquis and recent admission 9/13-9/19 for failure to thrive generalized weakness poor appetite and weight loss having been discharged back home after he was unable to get discharged to SNF presenting accompanied by son for assessment of worsening decreased appetite, nausea, some crampiness in the right abdomen and constipation.  Patient states he cannot remember the last time he had a bowel movement has been at least several days.  States he has been taking stool softeners but this does not help.  States he has had no appetite and has hardly had anything to drink.  He denies any other abdominal pain, back pain, chest pain, cough, shortness of breath except with exertion, headache, earache, sore throat, rash or acute extremity pain.  No other acute concerns at this time.         Past Medical History:  Diagnosis Date   AICD (automatic cardioverter/defibrillator) present 09/13/2012   Anemia    CAD (coronary artery disease)    a. s/p remote CABG, b. AL STEMI c/b VF arrest in 05/2012 => LHC 05/10/12: Severe 3v CAD, S-OM1/2 ok, SVG-Dx ok, SVG-RCA occluded, LIMA-LAD atretic, proximal LAD occluded. EF 20% with anterior apical HK=> salvage PCI with POBA and thrombectomy of the occluded LAD;  c. 01/2017: atretic LIMA_LAD and known occluded SVG-RCA. Patent LAD stent and 70% proxRCA stenosis (FFR 0.88).    Chronic systolic heart failure ([place])    a. Echo 12/13: EF 20-25%, anteroseptal, inferior, apical HK, mildly  reduced RV function, trivial pericardial effusion;   b.  Echo 3/14:  Ant, septal, apical AK, EF 25%   Clotting disorder ([place])    Depression    "years ago" (02/28/2018)   History of kidney stones    HLD (hyperlipidemia)    Hypertension    "I've never had high blood pressure; on RX for other reasons" (02/28/2018)   Inguinal hernia    "have one now on my left side" (09/13/2012); (02/28/2018)   Ischemic cardiomyopathy    Myocardial infarction [facility]) 1996;  05/2012   Osteoporosis    Paroxysmal atrial fibrillation ([place]) 03/17/2016   noted on device interrogation,  will follow burden   Pectus excavatum    Pulmonary embolism ([place])    a. after adhesiolysis for SBO in 04/2012 => coumadin for 6 mos   Renal cyst    SBO (small bowel obstruction) [facility]) November 2013   s/p lysis of adhesions   Sinoatrial node dysfunction ([place])    Skin cancer    "left ear; face" (02/28/2018)   Ventricular fibrillation ([place]) 05/2012   . in setting of AL STEMI 12/13 => EF 20-25% => d/c on LifeVest (repeat echo planned in 08/2012)    Patient Active Problem List   Diagnosis Date Noted   Stage 3b chronic kidney disease ([place]) 03/11/2021   Unable to care for self 03/11/2021   Constipation 03/11/2021   Failure to thrive in adult 03/11/2021   Protein-calorie malnutrition, severe 02/22/2021   FTT (failure to thrive) in adult 02/18/2021   Generalized weakness    Palliative care  encounter    Goals of care, counseling/discussion    Encounter for hospice care discussion    Heart failure ([place]) 02/28/2018   Decreased cardiac ejection fraction 01/10/2018   Nonspecific chest pain    Chronic combined systolic and diastolic heart failure (HCC)    Unstable angina ([place]):  (01/20/2017) a. 3-v CAD w/ patent SVG to diag and patent sequential SVG to OM 2 and OM 3. Patent prox LAD stent. LIMA to LAD and SVG to RCA known occl. stable prox RCA sten  01/19/2017   Claudication ([place]) 01/22/2014   Chronic kidney disease 12/26/2013   ICD  (implantable cardioverter-defibrillator) in place 02/16/2013   Ischemic cardiomyopathy 09/09/2012   Postop check 07/05/2012   STEMI (ST elevation myocardial infarction) ([place]) 05/18/2012   Anemia in chronic kidney disease(285.21) 05/17/2012   PE (pulmonary embolism) 05/17/2012   Physical deconditioning 05/17/2012   Anorexia 05/17/2012   Cardiogenic shock ([place]) 05/16/2012   Arterial hypotension 05/16/2012   Ventricular fibrillation arrest 05/10/2012   Cardiac arrest ([place]) 05/10/2012   Acute respiratory failure with hypoxia 2/2 cardiac arrest 05/10/2012   Acute encephalopathy 2/2 cardiac arrest 05/10/2012   Hyperglycemia 05/10/2012   Odynophagia 05/07/2012   Coronary artery disease 07/09/2011   History of coronary artery bypass surgery 07/09/2011   DYSPNEA 06/04/2010   HLD (hyperlipidemia) 04/17/2009   Essential hypertension 04/17/2009   Asymptomatic old MI (myocardial infarction) 04/17/2009   TOBACCO USE, QUIT 04/17/2009    Past Surgical History:  Procedure Laterality Date   CATARACT EXTRACTION W/ INTRAOCULAR LENS  IMPLANT, BILATERAL Bilateral    COLONOSCOPY     CORONARY ANGIOPLASTY WITH STENT PLACEMENT     "I've got 2 or 3 stents" (02/28/2018)   CORONARY ARTERY BYPASS GRAFT  1996   "CABG X5" (09/13/2012)   CYSTOSCOPY/RETROGRADE/URETEROSCOPY/STONE EXTRACTION WITH BASKET  1990's   ESOPHAGOGASTRODUODENOSCOPY  05/08/2012   Procedure: ESOPHAGOGASTRODUODENOSCOPY (EGD);  Surgeon: Juanita Craver, MD;  Location: [facility] END[facility];  Service: Endoscopy;  Laterality: N/A;  Nevin Bloodgood put on for Dr. Collene Mares , Dr. Collene Mares will call if she wants another time   IMPLANTABLE CARDIOVERTER DEFIBRILLATOR IMPLANT N/A 09/13/2012   SJM [facility] ER implanted by Dr Rayann Heman for secondary prevention   INGUINAL HERNIA REPAIR Right 1996   INTRAVASCULAR PRESSURE WIRE/FFR STUDY N/A 01/20/2017   Procedure: INTRAVASCULAR PRESSURE WIRE/FFR STUDY;  Surgeon: Wellington Hampshire, MD;  Location: [place] CV LAB;  Service: Cardiovascular;   Laterality: N/A;   LAPAROTOMY  04/27/2012   Procedure: EXPLORATORY LAPAROTOMY;  Surgeon: Gwenyth Ober, MD;  Location: [place];  Service: General;  Laterality: N/A;   LEFT HEART CATH AND CORS/GRAFTS ANGIOGRAPHY N/A 01/20/2017   Procedure: LEFT HEART CATH AND CORS/GRAFTS ANGIOGRAPHY;  Surgeon: Wellington Hampshire, MD;  Location: [place] CV LAB;  Service: Cardiovascular;  Laterality: N/A;   LEFT HEART CATHETERIZATION WITH CORONARY ANGIOGRAM N/A 05/10/2012   Procedure: LEFT HEART CATHETERIZATION WITH CORONARY ANGIOGRAM;  Surgeon: Burnell Blanks, MD;  Location: [facility] CATH LAB;  Service: Cardiovascular;  Laterality: N/A;   PTCA     percutaneous transluminal coronary intervention and brachy therapy, Bruce R. Olevia Perches, MD. EF 60%   RIGHT HEART CATH N/A 03/01/2018   Procedure: RIGHT HEART CATH;  Surgeon: Jolaine Artist, MD;  Location: [place] CV LAB;  Service: Cardiovascular;  Laterality: N/A;   SKIN CANCER EXCISION     "left ear; face" (02/28/2018)   TRANSURETHRAL RESECTION OF PROSTATE      Prior to Admission medications   Medication Sig Start Date End  Date Taking? Authorizing Provider  ondansetron (ZOFRAN) 4 MG tablet Take 1 tablet (4 mg total) by mouth every 8 (eight) hours as needed for up to 10 doses for nausea or vomiting. 03/11/21  Yes Lucrezia Starch, MD  apixaban (ELIQUIS) 2.5 MG TABS tablet Take 1 tablet (2.5 mg total) by mouth 2 (two) times daily. 07/09/20   Bensimhon, Shaune Pascal, MD  cholecalciferol (VITAMIN D) 1000 units tablet Take 2,000 Units by mouth daily.    [provider]  docusate sodium (COLACE) 100 MG capsule Take 2 capsules (200 mg total) by mouth at bedtime. 02/24/21   Domenic Polite, MD  feeding supplement, ENSURE ENLIVE, (ENSURE ENLIVE) LIQD Take 237 mLs by mouth 3 (three) times daily between meals. Patient not taking: No sig reported 03/03/18   Georgiana Shore, NP  Ferrous Sulfate (IRON) 325 (65 Fe) MG TABS Take 65 mg by mouth at bedtime.    [provider]  finasteride (PROSCAR) 5 MG tablet Take 5 mg by mouth daily. 08/30/19   [provider]  furosemide (LASIX) 20 MG tablet TAKE 1 TABLET BY MOUTH EVERY DAY Patient taking differently: Take 10 mg by mouth daily. 01/20/21   Lelon Perla, MD  nitroGLYCERIN (NITROSTAT) 0.4 MG SL tablet Place 1 tablet (0.4 mg total) under the tongue every 5 (five) minutes x 3 doses as needed for chest pain. Take only as needed for chest pain if your blood pressure is greater than 100/80. 01/22/17   Carlota Raspberry, Tiffany, [name]  rosuvastatin (CRESTOR) 20 MG tablet Take 1 tablet (20 mg total) by mouth daily. 04/08/20 02/18/21  Lelon Perla, MD    Allergies Patient has no known allergies.  Family History  Problem Relation Age of Onset   Heart disease Father    Other Mother        brain tumor   Colon cancer Brother    Cancer Brother        Liver   Heart disease Brother     Social History Social History   Tobacco Use   Smoking status: Former    Types: Cigarettes   Smokeless tobacco: Never   Tobacco comments:    09/14/2011; 02/28/2018  "quit smoking when I was ~ 17; probably didn't smoke 10 packs in my life"  Vaping Use   Vaping Use: Never used  Substance Use Topics   Alcohol use: Never   Drug use: Never    Review of Systems  Review of Systems  Constitutional:  Positive for malaise/fatigue. Negative for chills and fever.  HENT:  Negative for sore throat.   Eyes:  Negative for pain.  Respiratory:  Negative for cough and stridor.   Cardiovascular:  Negative for chest pain.  Gastrointestinal:  Positive for constipation and nausea. Negative for vomiting.  Genitourinary:  Negative for dysuria.  Musculoskeletal:  Negative for myalgias.  Skin:  Negative for rash.  Neurological:  Negative for seizures, loss of consciousness and headaches.  Psychiatric/Behavioral:  Negative for suicidal ideas.   All other systems reviewed and are negative.     ____________________________________________   PHYSICAL EXAM:  VITAL SIGNS: ED Triage Vitals  Enc Vitals Group     BP 03/11/21 1342 129/65     Pulse Rate 03/11/21 1342 (!) 51     Resp 03/11/21 1342 16     Temp 03/11/21 1342 98 F (36.7 C)     Temp Source 03/11/21 1342 Oral     SpO2 03/11/21 1342 96 %  Weight 03/11/21 1343 105 lb (47.6 kg)     Height 03/11/21 1343 5\' 11"  (1.803 m)     Head Circumference --      Peak Flow --      Pain Score 03/11/21 1343 0     Pain Loc --      Pain Edu? --      Excl. in [place]? --    Vitals:   03/11/21 1827 03/11/21 1902  BP:  125/72  Pulse: 69 (!) 53  Resp: 18 18  Temp:    SpO2: 99% 98%   Physical Exam Vitals and nursing note reviewed.  Constitutional:      Appearance: He is cachectic.  HENT:     Head: Normocephalic and atraumatic.     Right Ear: External ear normal.     Left Ear: External ear normal.     Nose: Nose normal.     Mouth/Throat:     Mouth: Mucous membranes are dry.  Eyes:     Conjunctiva/sclera: Conjunctivae normal.  Cardiovascular:     Rate and Rhythm: Normal rate and regular rhythm.     Heart sounds: No murmur heard. Pulmonary:     Effort: Pulmonary effort is normal. No respiratory distress.     Breath sounds: Normal breath sounds.  Abdominal:     Palpations: Abdomen is soft.     Tenderness: There is abdominal tenderness.  Musculoskeletal:     Cervical back: Neck supple.  Skin:    General: Skin is warm and dry.     Capillary Refill: Capillary refill takes less than 2 seconds.  Neurological:     Mental Status: He is alert.  Psychiatric:        Mood and Affect: Mood normal.    Some very mild tenderness in the right-sided abdomen. ____________________________________________   LABS (all labs ordered are listed, but only abnormal results are displayed)  Labs Reviewed  BASIC METABOLIC PANEL - Abnormal; Notable for the following components:      Result Value   BUN 36 (*)    Creatinine, Ser 1.74  (*)    GFR, Estimated 38 (*)    All other components within normal limits  CBC - Abnormal; Notable for the following components:   Platelets 144 (*)    All other components within normal limits  URINALYSIS, COMPLETE (UACMP) WITH MICROSCOPIC - Abnormal; Notable for the following components:   Color, Urine YELLOW (*)    APPearance HAZY (*)    Protein, ur 30 (*)    Leukocytes,Ua TRACE (*)    Bacteria, UA RARE (*)    All other components within normal limits  HEPATIC FUNCTION PANEL - Abnormal; Notable for the following components:   Total Bilirubin 1.3 (*)    Bilirubin, Direct 0.3 (*)    Indirect Bilirubin 1.0 (*)    All other components within normal limits  TROPONIN I (HIGH SENSITIVITY) - Abnormal; Notable for the following components:   Troponin I (High Sensitivity) 29 (*)    All other components within normal limits  TROPONIN I (HIGH SENSITIVITY) - Abnormal; Notable for the following components:   Troponin I (High Sensitivity) 28 (*)    All other components within normal limits  RESP PANEL BY RT-PCR (FLU A&B, COVID) ARPGX2  URINE CULTURE  CBG MONITORING, ED   ____________________________________________  EKG  ECG shows sinus rhythm with PVC, ventricular rate of 89, nonspecific ST changes and T wave changes throughout with unremarkable intervals. ____________________________________________  RADIOLOGY  ED MD interpretation:  CT abdomen pelvis remarkable for hepatic steatosis and some nonspecific liver nodules as well as possible splenic infarct and 8 cm rectal stool ball without evidence of stercoral colitis, diverticulitis, kidney stone or other acute abdominal pelvic process.  Official radiology report(s): CT ABDOMEN PELVIS W CONTRAST  Result Date: 03/11/2021 CLINICAL DATA:  Abdominal pain, acute, nonlocalized. constipation EXAM: CT ABDOMEN AND PELVIS WITH CONTRAST TECHNIQUE: Multidetector CT imaging of the abdomen and pelvis was performed using the standard protocol  following bolus administration of intravenous contrast. CONTRAST:  72mL OMNIPAQUE IOHEXOL 350 MG/ML SOLN COMPARISON:  CT abdomen pelvis 06/08/2019. FINDINGS: Lower chest: No acute abnormality. Hepatobiliary: The hepatic parenchyma is diffusely hypodense compared to the splenic parenchyma consistent with fatty infiltration. Heterogeneity of the left hepatic lobe with at least a couple of hyperdense lesions measuring 2.2 and 0.9 cm (2:25). No gallstones, gallbladder wall thickening, or pericholecystic fluid. No biliary dilatation. Pancreas: No focal lesion. Normal pancreatic contour. No surrounding inflammatory changes. No main pancreatic ductal dilatation. Spleen: Couple of wedge-shaped hypodense peripheral densities within the splenic parenchyma suggestive of splenic infarctions. A splenule is noted. Adrenals/Urinary Tract: No adrenal nodule bilaterally. Bilateral kidneys enhance symmetrically. Bilateral renal cortical scarring. Mild prominence of the left collecting system. No right hydronephrosis. No hydroureter. The urinary bladder is unremarkable. Stomach/Bowel: Stomach is within normal limits. No evidence of bowel wall thickening or dilatation. Rectal stool ball measuring up to 8 cm. Remainder of the colon is under distended and difficult to evaluate. Appendix appears normal. Vascular/Lymphatic: The main portal, splenic, superior mesenteric veins are patent. No abdominal aorta or iliac aneurysm. Severe atherosclerotic plaque of the aorta and its branches. No abdominal, pelvic, or inguinal lymphadenopathy. Reproductive: Prominent prostate measuring 4.5 cm. Other: Trace volume free fluid within the pelvis. No intraperitoneal free gas. No organized fluid collection. Musculoskeletal: No abdominal wall hernia or abnormality. No suspicious lytic or blastic osseous lesions. No acute displaced fracture. Dextroscoliosis centered at the L1-L2 level. IMPRESSION: 1. Marked hepatic steatosis with heterogeneity of the left  hepatic lobe with at least a couple of hepatic lesions measuring 2.2 and 0.9 cm. Recommend nonemergent MRI liver protocol for further evaluation. When the patient is clinically stable and able to follow directions and hold their breath (preferably as an outpatient) further evaluation with dedicated abdominal MRI should be considered. 2. Suggestion of splenic infarction. 3. An 8 cm rectal stool ball with no definite findings of stercoral colitis. Remainder of the colon is under distended and difficult to evaluate. 4. Mild prominence of the left collecting system of unclear etiology. 5. Prominent prostate. 6.  Aortic Atherosclerosis (ICD10-I70.0). Electronically Signed   By: Iven Finn M.D.   On: 03/11/2021 16:32    ____________________________________________   PROCEDURES  Procedure(s) performed (including Critical Care):  .1-3 Lead EKG Interpretation Performed by: Lucrezia Starch, MD Authorized by: Lucrezia Starch, MD     Interpretation: non-specific     ECG rate assessment: normal     Rhythm: sinus rhythm     Ectopy: PVCs     ____________________________________________   INITIAL IMPRESSION / ASSESSMENT AND PLAN / ED COURSE      Patient presents with above-stated history exam for assessment of generalized weakness, decreased appetite, some nausea and constipation.  He also states has had some achiness in his right abdomen on and off for last couple days as well.  Denies any other clear acute associated sick symptoms.  Differential includes ACS, metabolic derangements, SBO, acute viral infection with subsequent decreased p.o. intake and  possible ileus versus constipation rehydration, and urinary tract infection.  BMP shows no significant electrolyte or metabolic derangements.  Kidney function is at baseline.  CBC shows no leukocytosis or acute anemia.  UA has some trace leukocyte esterase and rare bacteria but overall less suspicious for UTI at this time.  There are no nitrites.   Only 6-10 WBCs.  ECG and stable troponins at 29 and 28 are not suggestive of ACS.  COVID influenza PCR is negative.  Hepatic function panel is unremarkable.  CT abdomen pelvis remarkable for hepatic steatosis and some nonspecific liver nodules as well as possible splenic infarct and 8 cm rectal stool ball without evidence of stercoral colitis, diverticulitis, kidney stone or other acute abdominal pelvic process.  Seems he was recently hospitalized for similar and discharged with plan to go to long-term SNF eventually but for hospice even at home.  He states that he has been interviewed but that hospice could do anything for him and that he feels like he needs to be in a nursing home as he has been getting weaker and weaker every day and there is no one there to help his son's mother every day.  Mom emergency room patient was having very small bowel movement and they felt little better.  I did offer a disimpaction and he declined this.  However he does appear slightly dehydrated and given overall frailty with need for essentially a full bowel cleanout with think it is reasonable for him to be observed to be hydrated overnight.  I will admit to medicine service.   ____________________________________________   FINAL CLINICAL IMPRESSION(S) / ED DIAGNOSES  Final diagnoses:  Constipation, unspecified constipation type  Splenic infarct  Hepatic steatosis  Hepatic lesion  Aortic atherosclerosis (HCC)    Medications  lidocaine (XYLOCAINE) 2 % jelly 1 application (1 application Topical Not Given 03/11/21 1900)  iohexol (OMNIPAQUE) 350 MG/ML injection 50 mL (50 mLs Intravenous Contrast Given 03/11/21 1558)  lactated ringers bolus 500 mL (500 mLs Intravenous New Bag/Given 03/11/21 1826)     ED Discharge Orders          Ordered    ondansetron (ZOFRAN) 4 MG tablet  Every 8 hours PRN        03/11/21 1946             Note:  This document was prepared using Dragon voice recognition software  and may include unintentional dictation errors.    Lucrezia Starch, MD 03/11/21 2118

## 2021-03-12 DIAGNOSIS — D735 Infarction of spleen: Secondary | ICD-10-CM

## 2021-03-12 DIAGNOSIS — K5904 Chronic idiopathic constipation: Secondary | ICD-10-CM

## 2021-03-12 DIAGNOSIS — I5042 Chronic combined systolic (congestive) and diastolic (congestive) heart failure: Secondary | ICD-10-CM

## 2021-03-12 MED ORDER — FINASTERIDE 5 MG PO TABS
5.0000 mg | ORAL_TABLET | Freq: Every day | ORAL | Status: DC
  Administered 2021-03-13 – 2021-03-15 (×3): 5 mg via ORAL
  Filled 2021-03-12 (×5): qty 1

## 2021-03-12 MED ORDER — DOCUSATE SODIUM 100 MG PO CAPS
200.0000 mg | ORAL_CAPSULE | Freq: Every day | ORAL | Status: DC
  Administered 2021-03-12 – 2021-03-14 (×3): 200 mg via ORAL
  Filled 2021-03-12 (×3): qty 2

## 2021-03-12 MED ORDER — POLYETHYLENE GLYCOL 3350 17 G PO PACK
17.0000 g | PACK | Freq: Two times a day (BID) | ORAL | Status: DC
  Administered 2021-03-12 – 2021-03-15 (×4): 17 g via ORAL
  Filled 2021-03-12 (×7): qty 1

## 2021-03-12 MED ORDER — SENNOSIDES-DOCUSATE SODIUM 8.6-50 MG PO TABS
1.0000 | ORAL_TABLET | Freq: Two times a day (BID) | ORAL | Status: DC
  Administered 2021-03-12 – 2021-03-15 (×4): 1 via ORAL
  Filled 2021-03-12 (×7): qty 1

## 2021-03-12 NOTE — Progress Notes (Signed)
[place] [facility]) Hospitalized Hospice Patient Visit   Louis Reid is a current hospice patient with a terminal diagnosis of hypertensive heart and chronic kidney disease with heart failure. Attempted home visit with patient and was informed by family today that the patient had been admitted to [facility] due to increased weakness. Per family, patient is unable to care for himself at home and they would like to seek placement in a facility at discharge from the hospital. Per Dr. Gilford Rile with [facility], this is a related hospital admission.    Visited patient at bedside. Son Louis Reid present. Patient reports feeling a little better today than yesterday. Continues to state no appetite. Patient and son both state that they would like for patient to be placed in a facility once stable for discharge from the hospital. Report exchanged with hospital care team.    Patient remains inpatient appropriate in order to evaluate for increased weakness.    V/S: T-98, 108/69, 90, 14, 98% on RA   I/O: 500/100   Abnormal Labs:  Bilirubin, Direct: 0.3 (H) Indirect Bilirubin: 1.0 (H) Total Bilirubin: 1.3 (H) Troponin I (High Sensitivity): 29 (H) BUN: 36 (H) Creatinine: 1.74 (H) GFR, Estimated: 38 (L) Platelets: 144 (L)  Diagnostics:  EXAM: CT ABDOMEN AND PELVIS WITH CONTRAST   IMPRESSION: 1. Marked hepatic steatosis with heterogeneity of the left hepatic lobe with at least a couple of hepatic lesions measuring 2.2 and 0.9 cm. Recommend nonemergent MRI liver protocol for further evaluation. When the patient is clinically stable and able to follow directions and hold their breath (preferably as an outpatient) further evaluation with dedicated abdominal MRI should be considered. 2. Suggestion of splenic infarction. 3. An 8 cm rectal stool ball with no definite findings of stercoral colitis. Remainder of the colon is under distended and difficult to evaluate. 4. Mild  prominence of the left collecting system of unclear etiology. 5. Prominent prostate. 6.  Aortic Atherosclerosis (ICD10-I70.0).   IV/PRN:  lactated ringers bolus 500 mL Once IV   Problem List: Generalized weakness   Unable to care for self   Failure to thrive in adult/protein calorie malnutrition -Generalized weakness likely related to chronic medical conditions, malnutrition and physical deconditioning. - No stigmata of infection or acute exacerbation of chronic medical conditions seen on work-up - Patient unable to manage at home where he lives alone and has preference for SNF placement versus home with hospice - PT OT dietary and TOC consult     Constipation - 8 cm rectal stool ball seen on CT abdomen and pelvis - Scheduled and as needed laxatives - Patient declined manual disimpaction offered by ED provider   Abnormal CT abdomen and pelvis -Left hepatic lobe lesions, suggestion of splenic infarct - Patient already on Eliquis regarding splenic infarct - Given advanced age and frailty, uncertain benefit of pursuing diagnostic investigation, and if so, cardiology clearance will be needed.  Coronary artery disease Chronic combined systolic and diastolic heart failure  Ischemic cardiomyopathy s/p AICD - All chronic and stable - Continue rosuvastatin, nitroglycerin, Lasix     Paroxysmal atrial fibrillation (HCC) - Continue apixaban.  Not currently on rate control agents     Stage 3b chronic kidney disease ([place]) - Chronic and stable     Essential hypertension - Chronic and stable   Discharge Planning: Ongoing-family would like for patient to be placed in a facility upon discharge from the hospital.   Family Contact: Spoke with son Louis Reid at bedside.   IDT: Updated  Goals of Care: Clear-DNR   Please do not hesitate to call with any hospice related questions or concerns.    Thank you,    Bobbie "Loren Racer, [place], BSN [facility] Liaison [phone]

## 2021-03-12 NOTE — ED Notes (Signed)
Pt given water, crackers and peanut butter per request , pt tolerated po well

## 2021-03-12 NOTE — Evaluation (Signed)
Physical Therapy Evaluation Patient Details Name: Louis Reid MRN: [id number] DOB: 10-26-1932 Today's Date: 03/12/2021  History of Present Illness  Louis Reid is a 87 y.o. male with medical history significant for Chronically ill male with CAD, advanced ischemic cardiomyopathy, EF 20% s/p ICD, PAF on Eliquis, CKD 3B,  protein calorie malnutrition with failure to thrive and chronic constipation, hospitalized from 9/13-9/19 during which he had a palliative care consult and discharged home with hospice where he lives alone who presents with protracted weakness, inability to manage on his own at home with shortness of breath on minimal exertion and inadequate nutrition due to his symptoms.   Clinical Impression  Pt is a pleasant 87 year old male who presents to ED with progressive weakness and decreased ability to perform ADLs. Pt currently lives at home with Son who is unable to provide care for patient. Pt reports that he is a household ambulator with usage of Rollator. Currenlty, pt completed bed mobility and transfers with SPV + usage of RW. Pt observed to ambulate with OT with CGA + RW and endorses increased fatigue with ambulation. Pt demonstrating decreased strength, ROM, and functional endurance. Due to decreased caregiver support, recommending long term care placement with HHPT to improve functional endurance for ADLs. Pt will continue to benefit from skilled PT services to address deficits above.     Recommendations for follow up therapy are one component of a multi-disciplinary discharge planning process, led by the attending physician.  Recommendations may be updated based on patient status, additional functional criteria and insurance authorization.  Follow Up Recommendations Other (comment) (Long Term Care with HHPT)    Equipment Recommendations  Rolling walker with 5" wheels    Recommendations for Other Services       Precautions / Restrictions Precautions Precautions:  Fall Restrictions Weight Bearing Restrictions: No      Mobility  Bed Mobility Overal bed mobility: Modified Independent Bed Mobility: Supine to Sit;Sit to Supine     Supine to sit: Supervision Sit to supine: Supervision   General bed mobility comments: Cues to come to sitting able to complete without assistance    Transfers Overall transfer level: Needs assistance Equipment used: Rolling walker (2 wheeled) Transfers: Sit to/from Stand Sit to Stand: Supervision         General transfer comment: SPV from elevated surface with cues for usage of RW to support himself  Ambulation/Gait Ambulation/Gait assistance: Min guard Gait Distance (Feet): 80 Feet Assistive device: Rolling walker (2 wheeled)   Gait velocity: decreased   General Gait Details: Pt observed to ambulate with OT with CGA. Pt ambulates with forward flexed posture and crouched positioning. Pt able to take side steps at EOB with this author with RW + SBA.  Stairs            Wheelchair Mobility    Modified Rankin (Stroke Patients Only)       Balance Overall balance assessment: Needs assistance Sitting-balance support: No upper extremity supported;Feet supported Sitting balance-Leahy Scale: Good Sitting balance - Comments: Good static sitting balance at EOB   Standing balance support: Bilateral upper extremity supported;During functional activity Standing balance-Leahy Scale: Fair Standing balance comment: Unable to stand and stabilize without usage of B UEs                             Pertinent Vitals/Pain Pain Assessment: No/denies pain    Home Living Family/patient expects to be discharged to::  Private residence Living Arrangements: Children Available Help at Discharge: Available PRN/intermittently;Family Type of Home: House Home Access: Stairs to enter Entrance Stairs-Rails: Right;Left;Can reach both Entrance Stairs-Number of Steps: 5 Home Layout: Able to live on main  level with bedroom/bathroom;Two level Home Equipment: Grab bars - toilet;Walker - 4 wheels Additional Comments: Pt lives with son however, he is unable to care for patient due to medical complications.    Prior Function Level of Independence: Needs assistance   Gait / Transfers Assistance Needed: Reports that he has decreased endurance and strength. Household ambulator only  ADL's / Homemaking Assistance Needed: At baseline, pt reportings being able to dress self (while holind on to furniture while standing). Has been spongebathing recently. Independent with toilet transfers/hygiene and feeding. Son assists with IADLs.        Hand Dominance   Dominant Hand: Right    Extremity/Trunk Assessment   Upper Extremity Assessment Upper Extremity Assessment: Generalized weakness    Lower Extremity Assessment Lower Extremity Assessment: Generalized weakness;RLE deficits/detail;LLE deficits/detail RLE Deficits / Details: Decreased ROM of AROM knee extension and DF. Decreased hip flexor strenth 4-/5 RLE Sensation: WNL RLE Coordination: WNL LLE Deficits / Details: Decreased ROM of AROM knee extension and DF. Decreased hip flexor strenth 4-/5. LLE Sensation: WNL LLE Coordination: WNL    Cervical / Trunk Assessment Cervical / Trunk Assessment: Other exceptions;Kyphotic (Increased thoracic khyposis and decreased lumbar lordosis. Forward head posture)  Communication   Communication: No difficulties  Cognition Arousal/Alertness: Awake/alert Behavior During Therapy: WFL for tasks assessed/performed Overall Cognitive Status: Within Functional Limits for tasks assessed                                 General Comments: Pt A&O x4. Pt declined any walking prior to start of evaluation however, agreeable to begin with sitting at EOB      General Comments General comments (skin integrity, edema, etc.): HR 98, SpO2 98% following functional mobility    Exercises     Assessment/Plan     PT Assessment Patient needs continued PT services  PT Problem List Decreased strength;Decreased activity tolerance;Decreased range of motion;Decreased balance;Decreased mobility;Decreased knowledge of use of DME       PT Treatment Interventions Gait training;Stair training;DME instruction;Functional mobility training;Therapeutic activities;Therapeutic exercise;Balance training;Neuromuscular re-education;Patient/family education;Modalities;Manual techniques    PT Goals (Current goals can be found in the Care Plan section)  Acute Rehab PT Goals Patient Stated Goal: to improve strength and endurance to be more independent PT Goal Formulation: With patient Time For Goal Achievement: 03/26/21 Potential to Achieve Goals: Fair    Frequency Min 2X/week   Barriers to discharge Decreased caregiver support      Co-evaluation               AM-PAC PT "6 Clicks" Mobility  Outcome Measure Help needed turning from your back to your side while in a flat bed without using bedrails?: A Little Help needed moving from lying on your back to sitting on the side of a flat bed without using bedrails?: A Little Help needed moving to and from a bed to a chair (including a wheelchair)?: A Little Help needed standing up from a chair using your arms (e.g., wheelchair or bedside chair)?: A Little Help needed to walk in hospital room?: A Little Help needed climbing 3-5 steps with a railing? : Total 6 Click Score: 16    End of Session   Activity Tolerance:  Patient tolerated treatment well;Patient limited by fatigue Patient left: in bed;with call bell/phone within reach Nurse Communication: Mobility status PT Visit Diagnosis: Unsteadiness on feet (R26.81);Muscle weakness (generalized) (M62.81)    Time: 1610-9604 PT Time Calculation (min) (ACUTE ONLY): 13 min   Charges:   PT Evaluation $PT Eval Low Complexity: 1 Low          Andrey Campanile, SPT   Andrey Campanile 03/12/2021, 1:10  PM

## 2021-03-12 NOTE — Progress Notes (Signed)
Kitchen called for patient's request for bread. Request put in.

## 2021-03-12 NOTE — Evaluation (Signed)
Occupational Therapy Evaluation Patient Details Name: Louis Reid MRN: [id number] DOB: [birth year] Today's Date: 03/12/2021   History of Present Illness 87 y.o. male with medical history significant for CAD, advanced ischemic cardiomyopathy, EF 20% s/p ICD, PAF on Eliquis, CKD 3B,  protein calorie malnutrition with failure to thrive and chronic constipation, hospitalized from 9/13-9/19 during which he had a palliative care consult and discharged home with hospice. Now presents with protracted weakness, inability to manage on his own at home with shortness of breath on minimal exertion and inadequate nutrition due to his symptoms.  Patient states that ideally he would like to go to SNF however review of discharge summary reveals that he had difficulty with placement during his recent stay.  He denies chest pain, nausea vomiting, cough fever or chills.  His main complaint is constipation stating he has not had a bowel movement in several days.   Clinical Impression   Pt seen for OT evaluation this date. Upon arrival to room, pt awake and seated upright in bed. Pt A&Ox4, tangential regarding LTC insurance coverage, however agreeable to OT eval/tx. Prior to admission, pt was residing with son in a multi-level home. At baseline, pt reports he is independent with dressing, sponge-bathing, toilet transfers/hygiene, and feeding. Pt endorses decreased strength and endurance over past 3-4 weeks, with household mobility limited to walking to bathroom with rollator and supervision from son. Pt currently presents with decreased balance and activity tolerance. Due to these functional impairments, pt requires MIN GUARD for functional mobility of household distances (~17ft), MIN GUARD for toilet transfers, and SUPERVISION for standing grooming tasks. HR 98 & SpO2 98% on RA following functional mobility. Pt would benefit from additional skilled OT services to maximize return to PLOF and minimize risk of future falls,  injury, caregiver burden, and readmission. Upon discharge, recommend [place] services at a LTC facility d/t decreased caregiver support at home and current functional impairments.    Recommendations for follow up therapy are one component of a multi-disciplinary discharge planning process, led by the attending physician.  Recommendations may be updated based on patient status, additional functional criteria and insurance authorization.   Follow Up Recommendations  Home health OT;Other (comment) (at LTC facility)    Equipment Recommendations  None recommended by OT       Precautions / Restrictions Precautions Precautions: Fall Restrictions Weight Bearing Restrictions: No      Mobility Bed Mobility Overal bed mobility: Modified Independent Bed Mobility: Supine to Sit;Sit to Supine     Supine to sit: Supervision;HOB elevated Sit to supine: Supervision;HOB elevated        Transfers Overall transfer level: Needs assistance Equipment used: Rolling walker (2 wheeled) Transfers: Sit to/from Stand Sit to Stand: Min guard;Min assist         General transfer comment: MIN GUARD from bed, MIN A from toilet (d/t low toilet height)    Balance Overall balance assessment: Needs assistance Sitting-balance support: No upper extremity supported;Feet supported Sitting balance-Leahy Scale: Good Sitting balance - Comments: Good static sitting balance at EOB   Standing balance support: Bilateral upper extremity supported;During functional activity Standing balance-Leahy Scale: Fair Standing balance comment: with UE support from RW, able to walk 152ft MIN GUARD.                           ADL either performed or assessed with clinical judgement   ADL Overall ADL's : Needs assistance/impaired     Grooming: Wash/dry hands;Supervision/safety;Standing  Toilet Transfer: Cueing for safety;Minimal assistance;Grab bars;RW           Functional mobility  during ADLs: Min guard;Rolling walker        Pertinent Vitals/Pain Pain Assessment: No/denies pain     Hand Dominance Right   Extremity/Trunk Assessment Upper Extremity Assessment Upper Extremity Assessment: Generalized weakness   Lower Extremity Assessment Lower Extremity Assessment: Generalized weakness   Cervical / Trunk Assessment Cervical / Trunk Assessment: Kyphotic   Communication Communication Communication: No difficulties   Cognition Arousal/Alertness: Awake/alert Behavior During Therapy: WFL for tasks assessed/performed Overall Cognitive Status: Within Functional Limits for tasks assessed                                 General Comments: A&Ox4. Self-limiting at times however agreeable to OT session. Pt tangential regarding LTC insurance coverage   General Comments  HR 98, SpO2 98% following functional mobility            Home Living Family/patient expects to be discharged to:: Skilled nursing facility Living Arrangements: Children Available Help at Discharge: Family Type of Home: House Home Access: Stairs to enter [employer] of Steps: 5 Entrance Stairs-Rails: Right;Left;Can reach both Home Layout: Able to live on main level with bedroom/bathroom;Two level Alternate Level Stairs-Number of Steps: 14 Alternate Level Stairs-Rails: Right Bathroom Shower/Tub: Tub/shower unit         Home Equipment: Grab bars - toilet;Walker - 4 wheels   Additional Comments: Home set-up obtained from recent admission (02/2021)      Prior Functioning/Environment Level of Independence: Needs assistance  Gait / Transfers Assistance Needed: Pt endorses decreased strength and endurance over past 3-4 weeks. Limited household mobility to walking to bathroom with rollator with supervision ADL's / Homemaking Assistance Needed: At baseline, pt reports being able to dress self (while holding on to furniture while standing). Has been spongebathing  recently. Independent with toilet transfers/hygiene and feeding. Son assists with IADLs.            OT Problem List: Decreased strength;Decreased activity tolerance;Impaired balance (sitting and/or standing);Decreased knowledge of use of DME or AE      OT Treatment/Interventions: Self-care/ADL training;Therapeutic exercise;DME and/or AE instruction;Therapeutic activities;Patient/family education    OT Goals(Current goals can be found in the care plan section) Acute Rehab OT Goals Patient Stated Goal: to safely perform ADLs/functional mobility OT Goal Formulation: With patient Time For Goal Achievement: 03/10/21 Potential to Achieve Goals: Good ADL Goals Pt Will Perform Lower Body Bathing: with set-up;with supervision;sit to/from stand Pt Will Transfer to Toilet: with supervision;ambulating;regular height toilet Pt Will Perform Toileting - Clothing Manipulation and hygiene: with supervision;sit to/from stand  OT Frequency: Min 1X/week   Barriers to D/C: Decreased caregiver support (is not able to physically assist.)             AM-PAC OT "6 Clicks" Daily Activity     Outcome Measure Help from another person eating meals?: None Help from another person taking care of personal grooming?: A Little Help from another person toileting, which includes using toliet, bedpan, or urinal?: A Little Help from another person bathing (including washing, rinsing, drying)?: A Little Help from another person to put on and taking off regular upper body clothing?: A Little Help from another person to put on and taking off regular lower body clothing?: A Little 6 Click Score: 19   End of Session Equipment Utilized During Treatment: Gait belt;Rolling walker Nurse Communication:  Mobility status  Activity Tolerance: Patient tolerated treatment well Patient left: in bed;with call bell/phone within reach;with nursing/sitter in room  OT Visit Diagnosis: Unsteadiness on feet (R26.81);Muscle weakness  (generalized) (M62.81)                Time: 3832-9191 OT Time Calculation (min): 33 min Charges:  OT General Charges $OT Visit: 1 Visit OT Evaluation $OT Eval Moderate Complexity: 1 Mod OT Treatments $Self Care/Home Management : 23-37 mins  Fredirick Maudlin, OTR/L [place]

## 2021-03-12 NOTE — Progress Notes (Signed)
Louis Reid  AOZ:[id number] DOB: [birth year] DOA: 03/11/2021 PCP: [name], MD    Brief Narrative:  87 year old with a history of CAD, advanced ischemic cardiomyopathy with EF 20% status post AICD, PAF on Eliquis, CKD stage IIIb, chronic protein calorie malnutrition, chronic constipation, and persisting FTT who's hospitalization 9/13 > 9/19 led to a palliative care consultation after which he was discharged home with hospice.  He lives alone.  Patient presented to the ED 10/4 stating that he no longer wanted to live alone and requesting SNF placement.   Consultants:  None  Code Status: NO CODE BLUE  Antimicrobials:  None  DVT prophylaxis: Eliquis  Subjective: Resting comfortably in bed at time of my visit.  The patient is alert and quite pleasant.  He reports that he feels very weak in general.  He also feels "bound up" and clarifies that he is referring to his significant constipation.  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 and is already on Eliquis - no indication for further investigation   CAD Asymptomatic  Chronic combined systolic and diastolic congestive heart failure -ischemic cardiomyopathy Appears reasonably well compensated at present  Chronic paroxysmal atrial fibrillation Rate controlled  CKD stage IIIb Renal function stable  HTN Blood pressure stable   Family Communication: No family present at time of exam Status is: Inpatient  Remains inpatient appropriate because:Unsafe d/c plan  Dispo: The patient is from: Home              Anticipated d/c is to: SNF              Patient currently is  medically stable to d/c.   Difficult to place patient No    Objective: Blood pressure (!) 125/95, pulse 90, temperature 98 F (36.7 C), resp. rate 14, height 5\' 11"  (1.803 m), weight 47.6 kg, SpO2 98 %.  Intake/Output Summary (Last 24 hours) at 03/12/2021 0845 Last data filed at 03/12/2021 0526 Gross per 24 hour  Intake 500 ml  Output 100 ml  Net 400 ml   Filed Weights   03/11/21 1343  Weight: 47.6 kg    Examination: General: No acute respiratory distress Lungs: Clear to auscultation bilaterally without wheezes or crackles Cardiovascular: Regular rate and rhythm without murmur gallop or rub normal S1 and S2 Abdomen: Nontender, nondistended, soft, bowel sounds positive, no rebound, no ascites, no appreciable mass Extremities: No significant cyanosis, clubbing, or edema bilateral lower extremities  CBC: Recent Labs  Lab 03/11/21 1347  WBC 7.5  HGB 15.0  HCT 43.2  MCV 95.8  PLT 962*   Basic Metabolic Panel: Recent Labs  Lab 03/11/21 1347  NA 141  K 3.5  CL 105  CO2 27  GLUCOSE 94  BUN 36*  CREATININE 1.74*  CALCIUM 9.7   GFR: Estimated Creatinine Clearance: 20.5 mL/min (A) (by C-G formula based on SCr of 1.74 mg/dL (H)).  Liver Function Tests: Recent Labs  Lab 03/11/21 1512  AST 30  ALT 24  ALKPHOS 82  BILITOT 1.3*  PROT 6.7  ALBUMIN 4.1    HbA1C: Hgb A1c MFr Bld  Date/Time Value Ref Range Status  05/18/2012 04:10 AM 5.9 (H) <5.7 %  Final    Comment:    (NOTE)                                                                       According to the ADA Clinical Practice Recommendations for 2011, when HbA1c is used as a screening test:  >=6.5%   Diagnostic of Diabetes Mellitus           (if abnormal result is confirmed) 5.7-6.4%   Increased risk of developing Diabetes Mellitus References:Diagnosis and Classification of Diabetes Mellitus,Diabetes NIOE,7035,00(XFGHW 1):S62-S69 and Standards of Medical Care in         Diabetes - 2011,Diabetes  EXHB,7169,67 (Suppl 1):S11-S61.     Recent Results (from the past 240 hour(s))  Resp Panel by RT-PCR (Flu A&B, Covid) Nasopharyngeal Swab     Status: None   Collection Time: 03/11/21  3:27 PM   Specimen: Nasopharyngeal Swab; Nasopharyngeal(NP) swabs in vial transport medium  Result Value Ref Range Status   SARS Coronavirus 2 by RT PCR NEGATIVE NEGATIVE Final    Comment: (NOTE) SARS-CoV-2 target nucleic acids are NOT DETECTED.  The SARS-CoV-2 RNA is generally detectable in upper respiratory specimens during the acute phase of infection. The lowest concentration of SARS-CoV-2 viral copies this assay can detect is 138 copies/mL. A negative result does not preclude SARS-Cov-2 infection and should not be used as the sole basis for treatment or other patient management decisions. A negative result may occur with  improper specimen collection/handling, submission of specimen other than nasopharyngeal swab, presence of viral mutation(s) within the areas targeted by this assay, and inadequate number of viral copies(<138 copies/mL). A negative result must be combined with clinical observations, patient history, and epidemiological information. The expected result is Negative.  Fact Sheet for Patients:  EntrepreneurPulse.com.au  Fact Sheet for Healthcare Providers:  IncredibleEmployment.be  This test is no t yet approved or cleared by the [place] FDA and  has been authorized for detection and/or diagnosis of SARS-CoV-2 by FDA under an Emergency Use Authorization (EUA). This EUA will remain  in effect (meaning this test can be used) for the duration of the COVID-19 declaration under Section 564(b)(1) of the Act, 21 U.S.C.section 360bbb-3(b)(1), unless the authorization is terminated  or revoked sooner.       Influenza A by PCR NEGATIVE NEGATIVE Final   Influenza B by PCR NEGATIVE NEGATIVE Final    Comment: (NOTE) The Xpert Xpress  SARS-CoV-2/FLU/RSV plus assay is intended as an aid in the diagnosis of influenza from Nasopharyngeal swab specimens and should not be used as a sole basis for treatment. Nasal washings and aspirates are unacceptable for Xpert Xpress SARS-CoV-2/FLU/RSV testing.  Fact Sheet for Patients: EntrepreneurPulse.com.au  Fact Sheet for Healthcare Providers: IncredibleEmployment.be  This test is not yet approved or cleared by the [place] FDA and has been authorized for detection and/or diagnosis of SARS-CoV-2 by FDA under an Emergency Use Authorization (EUA). This EUA will remain in effect (meaning this test can be used) for the duration of the COVID-19 declaration under Section 564(b)(1) of the Act, 21 U.S.C. section 360bbb-3(b)(1), unless the authorization is terminated or revoked.  Performed at [facility], [address] [place], [place] [address]      Scheduled Meds:  apixaban  2.5 mg Oral BID   feeding supplement  237 mL Oral TID BM   lidocaine  1 application Topical Once   polyethylene glycol  17 g Oral Daily   rosuvastatin  20 mg Oral Daily      LOS: 1 day   Cherene Altes, MD [facility] Office  [phone] Pager - Text Page per Shea Evans  If 7PM-7AM, please contact night-coverage per Amion 03/12/2021, 8:45 AM

## 2021-03-12 NOTE — Progress Notes (Signed)
-  Patient frustrated by telemetry monitor and SPo2 monitor and wanted it taken off. Monitor removed from patient.  -Breakfast tray delivered to room.

## 2021-03-13 ENCOUNTER — Encounter: Payer: Self-pay | Admitting: Internal Medicine

## 2021-03-13 DIAGNOSIS — D735 Infarction of spleen: Secondary | ICD-10-CM | POA: Diagnosis present

## 2021-03-13 DIAGNOSIS — K5900 Constipation, unspecified: Secondary | ICD-10-CM

## 2021-03-13 DIAGNOSIS — K760 Fatty (change of) liver, not elsewhere classified: Secondary | ICD-10-CM | POA: Diagnosis present

## 2021-03-13 LAB — URINE CULTURE: Culture: 20000 — AB

## 2021-03-13 MED ORDER — ADULT MULTIVITAMIN W/MINERALS CH
1.0000 | ORAL_TABLET | Freq: Every day | ORAL | Status: DC
  Administered 2021-03-14 – 2021-03-15 (×2): 1 via ORAL
  Filled 2021-03-13 (×2): qty 1

## 2021-03-13 MED ORDER — LACTULOSE 10 GM/15ML PO SOLN: 30.0000 g | Freq: Two times a day (BID) | ORAL | Status: DC | PRN

## 2021-03-13 NOTE — TOC Initial Note (Signed)
Transition of Care [facility]) - Initial/Assessment Note    Patient Details  Name: Louis Reid MRN: [id number] Date of Birth: 29-Jun-1932  Transition of Care [facility]) CM/SW Contact:    Beverly Sessions, RN Phone Number: 03/13/2021, 3:21 PM  Clinical Narrative:                  Patient admitted from home with [facility] Patient and son wallace would like LTC placement private pay and continue hospice  Fl2 sent for signature Obtained PASRR Bed search initatied   Expected Discharge Plan: Skilled Nursing Facility Barriers to Discharge: Continued Medical Work up   Patient Goals and CMS Choice        Expected Discharge Plan and Services Expected Discharge Plan: [place]                                              Prior Living Arrangements/Services   Lives with:: Adult Children Patient language and need for interpreter reviewed:: Yes Do you feel safe going back to the place where you live?: No      Need for Family Participation in Patient Care: Yes (Comment) Care giver support system in place?: Yes (comment) Current home services: Hospice Criminal Activity/Legal Involvement Pertinent to Current Situation/Hospitalization: No - Comment as needed  Activities of Daily Living Home Assistive Devices/Equipment: None ADL Screening (condition at time of admission) Patient's cognitive ability adequate to safely complete daily activities?: Yes Is the patient deaf or have difficulty hearing?: No Does the patient have difficulty seeing, even when wearing glasses/contacts?: No Does the patient have difficulty concentrating, remembering, or making decisions?: No Patient able to express need for assistance with ADLs?: Yes Does the patient have difficulty dressing or bathing?: No Independently performs ADLs?: Yes (appropriate for developmental age) Does the patient have difficulty walking or climbing stairs?: Yes Weakness of Legs: Both Weakness of  Arms/Hands: None  Permission Sought/Granted                  Emotional Assessment              Admission diagnosis:  Aortic atherosclerosis ([place]) [I70.0] Hepatic steatosis [K76.0] Splenic infarct [D73.5] Failure to thrive in adult [R62.7] Hepatic lesion [K76.9] Constipation, unspecified constipation type [K59.00] Patient Active Problem List   Diagnosis Date Noted   Stage 3b chronic kidney disease ([place]) 03/11/2021   Unable to care for self 03/11/2021   Constipation 03/11/2021   Failure to thrive in adult 03/11/2021   Protein-calorie malnutrition, severe 02/22/2021   Generalized weakness    Palliative care encounter    Goals of care, counseling/discussion    Encounter for hospice care discussion    Heart failure ([place]) 02/28/2018   Decreased cardiac ejection fraction 01/10/2018   Nonspecific chest pain    Chronic combined systolic and diastolic heart failure ([place])    Unstable angina ([place]):  (01/20/2017) a. 3-v CAD w/ patent SVG to diag and patent sequential SVG to OM 2 and OM 3. Patent prox LAD stent. LIMA to LAD and SVG to RCA known occl. stable prox RCA sten  01/19/2017   Paroxysmal atrial fibrillation ([place]) 03/17/2016   Claudication ([place]) 01/22/2014   Chronic kidney disease 12/26/2013   ICD (implantable cardioverter-defibrillator) in place 02/16/2013   Ischemic cardiomyopathy 09/09/2012   Postop check 07/05/2012   STEMI (ST elevation myocardial infarction) ([place]) 05/18/2012  Anemia in chronic kidney disease(285.21) 05/17/2012   PE (pulmonary embolism) 05/17/2012   Physical deconditioning 05/17/2012   Anorexia 05/17/2012   Cardiogenic shock ([place]) 05/16/2012   Arterial hypotension 05/16/2012   Ventricular fibrillation arrest 05/10/2012   Cardiac arrest ([place]) 05/10/2012   Acute respiratory failure with hypoxia 2/2 cardiac arrest 05/10/2012   Acute encephalopathy 2/2 cardiac arrest 05/10/2012   Hyperglycemia 05/10/2012   Odynophagia 05/07/2012   Coronary artery  disease 07/09/2011   History of coronary artery bypass surgery 07/09/2011   DYSPNEA 06/04/2010   HLD (hyperlipidemia) 04/17/2009   Essential hypertension 04/17/2009   Asymptomatic old MI (myocardial infarction) 04/17/2009   TOBACCO USE, QUIT 04/17/2009   PCP:  [name], MD Pharmacy:   [id number] [place], [place] [address] [place] [address] Phone: [phone] Fax: [phone]  [facility]  ([place], [place] [facility] [address] [place] [address] Phone: [phone] Fax: [phone]     Social Determinants of Health (SDOH) Interventions    Readmission Risk Interventions No flowsheet data found.

## 2021-03-13 NOTE — Progress Notes (Signed)
[facility]) Hospitalized Hospice Patient Visit   Louis Reid is a current hospice patient with a terminal diagnosis of hypertensive heart and chronic kidney disease with heart failure. Attempted home visit with patient and was informed by family today that the patient had been admitted to [facility] due to increased weakness. Per family, patient is unable to care for himself at home and they would like to seek placement in a facility at discharge from the hospital. Per Dr. Gilford Rile with [facility], this is a related hospital admission.    Visited patient at bedside. Patient reports feeling about the same today as yesterday. Exchanged report with patient's hospital case manager and exchanged contact information with his [place] continues to be to look for placement in a LTC facility with Hospice to follow.    Patient remains inpatient appropriate in order to evaluate for increased weakness.    V/S: T- 97.7, HR 62, RR 16, BP 111/67, spO2 96   I/O: 240/240   Abnormal Labs: None new  Diagnostics: None new   IV/PRN:    Problem List: Generalized weakness   Unable to care for self   Failure to thrive in adult/protein calorie malnutrition -Generalized weakness likely related to chronic medical conditions, malnutrition and physical deconditioning. - No stigmata of infection or acute exacerbation of chronic medical conditions seen on work-up - Patient unable to manage at home where he lives alone and has preference for SNF placement versus home with hospice - PT OT dietary and TOC consult     Constipation - 8 cm rectal stool ball seen on CT abdomen and pelvis - Scheduled and as needed laxatives - Patient declined manual disimpaction offered by ED provider   Abnormal CT abdomen and pelvis -Left hepatic lobe lesions, suggestion of splenic infarct - Patient already on Eliquis regarding splenic infarct - Given advanced age and frailty, uncertain benefit of  pursuing diagnostic investigation, and if so, cardiology clearance will be needed.  Coronary artery disease Chronic combined systolic and diastolic heart failure  Ischemic cardiomyopathy s/p AICD - All chronic and stable - Continue rosuvastatin, nitroglycerin, Lasix     Paroxysmal atrial fibrillation (HCC) - Continue apixaban.  Not currently on rate control agents     Stage 3b chronic kidney disease ([place]) - Chronic and stable     Essential hypertension - Chronic and stable   Discharge Planning: Ongoing-family would like for patient to be placed in a facility upon discharge from the hospital.   Family Contact: Spoke with son [name] by telephone.    IDT: Updated   Goals of Care: Clear-DNR   Please do not hesitate to call with any hospice related questions or concerns.    Thank you, Jhonnie Garner, BSN, RN, [facility] hospital liaison [phone]

## 2021-03-13 NOTE — Progress Notes (Signed)
Initial Nutrition Assessment  DOCUMENTATION CODES:   Severe malnutrition in context of chronic illness  INTERVENTION:   Ensure Enlive po TID, each supplement provides 350 kcal and 20 grams of protein  Magic cup TID with meals, each supplement provides 290 kcal and 9 grams of protein  MVI po daily   Liberalize diet   Pt at high refeed risk; recommend monitor potassium, magnesium and phosphorus labs daily until stable  NUTRITION DIAGNOSIS:   Severe Malnutrition related to chronic illness (CHF, FTT, advanced age) as evidenced by 23 percent weight loss in 1 year, moderate fat depletion, severe muscle depletion.  GOAL:   Patient will meet greater than or equal to 90% of their needs  MONITOR:   PO intake, Supplement acceptance, Labs, Weight trends, Skin, I & O's  REASON FOR ASSESSMENT:   Consult Assessment of nutrition requirement/status  ASSESSMENT:   87 year old hospice patient with a history of CAD, HTN, advanced ischemic cardiomyopathy with EF 20% status post AICD, PAF on Eliquis, CKD stage IIIb, chronic protein calorie malnutrition, chronic constipation and persisting FTT.  Met with pt in room today. Pt in good spirits, sitting up and cracking jokes today. Pt reports poor appetite and oral intake at baseline. Pt reports that he eats best at breakfast (eggs and toast) but then he remains full for the rest of the day and will only eat about 4 bites of his lunch and dinner. Pt does report that he drinks chocolate Ensure at home. Pt documented to have eaten 100% of breakfast this morning. Pt reports only eating bites of lunch. Pt reports that his taste is about 80% gone. Pt enjoys the spaghetti and pot roast but reports that he can barely taste it. Pt loves rolls. Pt reports that he has lost over 100lbs over the past several years. Per chart, pt is down 31lbs(23%) over the past year; this is significant weight loss. RD will add supplements and MVI to help pt meet his estimated  needs. RD will also liberalize pt's diet as pt is not eating enough to exceed and nutrient limits. Pt is likely at refeed risk. Plan is for SNF with hospice at discharge.   Medications reviewed and include: colace, miralax, senokot  Labs reviewed: none recent  NUTRITION - FOCUSED PHYSICAL EXAM:  Flowsheet Row Most Recent Value  Orbital Region Severe depletion  Upper Arm Region Severe depletion  Thoracic and Lumbar Region Severe depletion  Buccal Region Severe depletion  Temple Region Severe depletion  Clavicle Bone Region Severe depletion  Clavicle and Acromion Bone Region Severe depletion  Scapular Bone Region Severe depletion  Dorsal Hand Severe depletion  Patellar Region Severe depletion  Anterior Thigh Region Severe depletion  Posterior Calf Region Severe depletion  Edema (RD Assessment) None  Hair Reviewed  Eyes Reviewed  Mouth Reviewed  Skin Reviewed  Nails Reviewed   Diet Order:   Diet Order             Diet regular Room service appropriate? Yes; Fluid consistency: Thin  Diet effective now                  EDUCATION NEEDS:   Education needs have been addressed  Skin:  Skin Assessment: Reviewed RN Assessment  Last BM:  10/5  Height:   Ht Readings from Last 1 Encounters:  03/11/21 $RemoveB'5\' 11"'KaiMxfZP$  (1.803 m)    Weight:   Wt Readings from Last 1 Encounters:  03/11/21 47.6 kg    Ideal Body Weight:  78 kg  BMI:  Body mass index is 14.64 kg/m.  Estimated Nutritional Needs:   Kcal:  1700-1900kcal/day  Protein:  85-95g/day  Fluid:  1.2-1.5L/day  Koleen Distance MS, RD, LDN Please refer to [facility] for RD and/or RD on-call/weekend/after hours pager

## 2021-03-13 NOTE — NC FL2 (Signed)
[place] LEVEL OF CARE SCREENING TOOL     IDENTIFICATION  Patient Name: Louis Reid Birthdate: [birth year] Sex: male Admission Date (Current Location): 03/11/2021  [facility] and [place] Number:  Engineering geologist and Address:         Provider Number: [id number]  Attending Physician Name and Address:  Cherene Altes, MD  Relative Name and Phone Number:       Current Level of Care: Hospital Recommended Level of Care: [place] Prior Approval Number:    Date Approved/Denied:   PASRR Number: [id number]  Discharge Plan: SNF    Current Diagnoses: Patient Active Problem List   Diagnosis Date Noted   Stage 3b chronic kidney disease ([place]) 03/11/2021   Unable to care for self 03/11/2021   Constipation 03/11/2021   Failure to thrive in adult 03/11/2021   Protein-calorie malnutrition, severe 02/22/2021   Generalized weakness    Palliative care encounter    Goals of care, counseling/discussion    Encounter for hospice care discussion    Heart failure ([place]) 02/28/2018   Decreased cardiac ejection fraction 01/10/2018   Nonspecific chest pain    Chronic combined systolic and diastolic heart failure ([place])    Unstable angina ([place]):  (01/20/2017) a. 3-v CAD w/ patent SVG to diag and patent sequential SVG to OM 2 and OM 3. Patent prox LAD stent. LIMA to LAD and SVG to RCA known occl. stable prox RCA sten  01/19/2017   Paroxysmal atrial fibrillation ([place]) 03/17/2016   Claudication ([place]) 01/22/2014   Chronic kidney disease 12/26/2013   ICD (implantable cardioverter-defibrillator) in place 02/16/2013   Ischemic cardiomyopathy 09/09/2012   Postop check 07/05/2012   STEMI (ST elevation myocardial infarction) ([place]) 05/18/2012   Anemia in chronic kidney disease(285.21) 05/17/2012   PE (pulmonary embolism) 05/17/2012   Physical deconditioning 05/17/2012   Anorexia 05/17/2012   Cardiogenic shock ([place]) 05/16/2012   Arterial hypotension 05/16/2012    Ventricular fibrillation arrest 05/10/2012   Cardiac arrest ([place]) 05/10/2012   Acute respiratory failure with hypoxia 2/2 cardiac arrest 05/10/2012   Acute encephalopathy 2/2 cardiac arrest 05/10/2012   Hyperglycemia 05/10/2012   Odynophagia 05/07/2012   Coronary artery disease 07/09/2011   History of coronary artery bypass surgery 07/09/2011   DYSPNEA 06/04/2010   HLD (hyperlipidemia) 04/17/2009   Essential hypertension 04/17/2009   Asymptomatic old MI (myocardial infarction) 04/17/2009   TOBACCO USE, QUIT 04/17/2009    Orientation RESPIRATION BLADDER Height & Weight     Self, Time, Place, Situation  Normal Continent Weight: 47.6 kg Height:  5\' 11"  (180.3 cm)  BEHAVIORAL SYMPTOMS/MOOD NEUROLOGICAL BOWEL NUTRITION STATUS      Incontinent Diet (regular)  AMBULATORY STATUS COMMUNICATION OF NEEDS Skin   Limited Assist Verbally Normal                       Personal Care Assistance Level of Assistance              Functional Limitations Info             SPECIAL CARE FACTORS FREQUENCY   (Hospice with [facility])                    Contractures Contractures Info: Not present    Additional Factors Info  Code Status, Allergies Code Status Info: DNR Allergies Info: NKDA           Current Medications (03/13/2021):  This is the current hospital active medication  list Current Facility-Administered Medications  Medication Dose Route Frequency Provider Last Rate Last Admin   acetaminophen (TYLENOL) tablet 650 mg  650 mg Oral Q6H PRN Athena Masse, MD   650 mg at 03/13/21 0936   apixaban (ELIQUIS) tablet 2.5 mg  2.5 mg Oral BID Judd Gaudier V, MD   2.5 mg at 03/13/21 0932   bisacodyl (DULCOLAX) suppository 10 mg  10 mg Rectal Daily PRN Athena Masse, MD       docusate sodium (COLACE) capsule 200 mg  200 mg Oral QHS Joette Catching T, MD   200 mg at 03/12/21 2036   feeding supplement (ENSURE ENLIVE / ENSURE PLUS) liquid 237 mL  237 mL Oral TID BM  Cherene Altes, MD   237 mL at 03/13/21 1339   finasteride (PROSCAR) tablet 5 mg  5 mg Oral Daily Cherene Altes, MD   5 mg at 03/13/21 0933   lidocaine (XYLOCAINE) 2 % jelly 1 application  1 application Topical Once Lucrezia Starch, MD       [START ON 03/14/2021] multivitamin with minerals tablet 1 tablet  1 tablet Oral Daily Cherene Altes, MD       ondansetron [facility]) tablet 4 mg  4 mg Oral Q6H PRN Athena Masse, MD       Or   ondansetron [facility]) injection 4 mg  4 mg Intravenous Q6H PRN Athena Masse, MD       polyethylene glycol (MIRALAX / GLYCOLAX) packet 17 g  17 g Oral BID Cherene Altes, MD   17 g at 03/13/21 5035   senna-docusate (Senokot-S) tablet 1 tablet  1 tablet Oral BID Cherene Altes, MD   1 tablet at 03/13/21 0932     Discharge Medications: Please see discharge summary for a list of discharge medications.  Relevant Imaging Results:  Relevant Lab Results:   Additional Information ss [id number]  Beverly Sessions, RN

## 2021-03-13 NOTE — Progress Notes (Signed)
Louis Reid  ATF:[id number] DOB: [birth year] DOA: 03/11/2021 PCP: [name], MD    Brief Narrative:  [phone] with a history of CAD, advanced ischemic cardiomyopathy with EF 20% status post AICD, PAF on Eliquis, CKD stage IIIb, chronic protein calorie malnutrition, chronic constipation, and persisting FTT who's hospitalization 9/13 > 9/19 led to a Palliative Care consultation after which he was discharged home with Hospice.  He lives alone.  Patient presented to the ED 10/4 stating that he no longer felt he was safe to live alone and requesting SNF placement. He was found to be suffering w/ a significant fecal impaction at presentation.   Consultants:  None  Code Status: NO CODE BLUE  Antimicrobials:  None  DVT prophylaxis: Eliquis  Subjective: Afebrile.  Vital signs stable.  Saturation 96% on room air.  Resting comfortably in bed.  Reports that he is beginning to have more frequent bowel movements, but has not yet had a large bowel movement.  Denies abdominal pain and cramps.  Reports ongoing poor appetite and limited intake.  Assessment & Plan:  Severe generalized weakness -unable to care for self -FTT -protein calorie malnutrition Patient has failed an attempt to live independently at home with assistance -attempt to place in SNF  Constipation CT abdomen/pelvis noted 8 cm stool impaction in rectum - declined manual disimpaction - increase bowel regimen and follow -having some effect thus far -continue  Splenic infarctions Has history of same and is already on Eliquis - no indication for further investigation   CAD Asymptomatic  Chronic combined systolic and diastolic congestive heart failure -ischemic cardiomyopathy Appears reasonably well compensated at present  Chronic paroxysmal atrial fibrillation Rate controlled  CKD stage IIIb Renal function stable  HTN Blood pressure stable   Family Communication: No family present at time of exam Status is:  Inpatient  Remains inpatient appropriate because:Unsafe d/c plan  Dispo: The patient is from: Home              Anticipated d/c is to: SNF              Patient currently is medically stable to d/c.   Difficult to place patient No    Objective: Blood pressure 111/67, pulse 62, temperature 97.7 F (36.5 C), resp. rate 16, height 5\' 11"  (1.803 m), weight 47.6 kg, SpO2 96 %.  Intake/Output Summary (Last 24 hours) at 03/13/2021 1045 Last data filed at 03/12/2021 2041 Gross per 24 hour  Intake 240 ml  Output 200 ml  Net 40 ml    Filed Weights   03/11/21 1343  Weight: 47.6 kg    Examination: General: No acute respiratory distress Lungs: Clear to auscultation bilaterally  Cardiovascular: RRR without murmur Abdomen: NT/ND, soft, BS positive, no rebound, no ascites Extremities: No significant edema bilateral lower extremities  CBC: Recent Labs  Lab 03/11/21 1347  WBC 7.5  HGB 15.0  HCT 43.2  MCV 95.8  PLT 144*    Basic Metabolic Panel: Recent Labs  Lab 03/11/21 1347  NA 141  K 3.5  CL 105  CO2 27  GLUCOSE 94  BUN 36*  CREATININE 1.74*  CALCIUM 9.7    GFR: Estimated Creatinine Clearance: 20.5 mL/min (A) (by C-G formula based on SCr of 1.74 mg/dL (H)).  Liver Function Tests: Recent Labs  Lab 03/11/21 1512  AST 30  ALT 24  ALKPHOS 82  BILITOT 1.3*  PROT 6.7  ALBUMIN 4.1     HbA1C: Hgb A1c MFr Bld  Date/Time  Value Ref Range Status  05/18/2012 04:10 AM 5.9 (H) <5.7 % Final    Comment:    (NOTE)                                                                       According to the ADA Clinical Practice Recommendations for 2011, when HbA1c is used as a screening test:  >=6.5%   Diagnostic of Diabetes Mellitus           (if abnormal result is confirmed) 5.7-6.4%   Increased risk of developing Diabetes Mellitus References:Diagnosis and Classification of Diabetes Mellitus,Diabetes OINO,6767,20(NOBSJ 1):S62-S69 and Standards of Medical Care in          Diabetes - 2011,Diabetes GGEZ,6629,47 (Suppl 1):S11-S61.     Recent Results (from the past 240 hour(s))  Urine Culture     Status: Abnormal   Collection Time: 03/11/21  1:47 PM   Specimen: Urine, Random  Result Value Ref Range Status   Specimen Description   Final    URINE, RANDOM Performed at [facility], [address] [place], [place] [address]    Special Requests   Final    NONE Performed at [facility], [place], [place] [address]    Culture 20,000 COLONIES/mL ENTEROCOCCUS FAECALIS (A)  Final   Report Status 03/13/2021 FINAL  Final   Organism ID, Bacteria ENTEROCOCCUS FAECALIS (A)  Final      Susceptibility   Enterococcus faecalis - MIC*    AMPICILLIN <=2 SENSITIVE Sensitive     NITROFURANTOIN <=16 SENSITIVE Sensitive     VANCOMYCIN 1 SENSITIVE Sensitive     * 20,000 COLONIES/mL ENTEROCOCCUS FAECALIS  Resp Panel by RT-PCR (Flu A&B, Covid) Nasopharyngeal Swab     Status: None   Collection Time: 03/11/21  3:27 PM   Specimen: Nasopharyngeal Swab; Nasopharyngeal(NP) swabs in vial transport medium  Result Value Ref Range Status   SARS Coronavirus 2 by RT PCR NEGATIVE NEGATIVE Final    Comment: (NOTE) SARS-CoV-2 target nucleic acids are NOT DETECTED.  The SARS-CoV-2 RNA is generally detectable in upper respiratory specimens during the acute phase of infection. The lowest concentration of SARS-CoV-2 viral copies this assay can detect is 138 copies/mL. A negative result does not preclude SARS-Cov-2 infection and should not be used as the sole basis for treatment or other patient management decisions. A negative result may occur with  improper specimen collection/handling, submission of specimen other than nasopharyngeal swab, presence of viral mutation(s) within the areas targeted by this assay, and inadequate number of viral copies(<138 copies/mL). A negative result must be combined with clinical observations, patient history, and  epidemiological information. The expected result is Negative.  Fact Sheet for Patients:  EntrepreneurPulse.com.au  Fact Sheet for Healthcare Providers:  IncredibleEmployment.be  This test is no t yet approved or cleared by the [place] FDA and  has been authorized for detection and/or diagnosis of SARS-CoV-2 by FDA under an Emergency Use Authorization (EUA). This EUA will remain  in effect (meaning this test can be used) for the duration of the COVID-19 declaration under Section 564(b)(1) of the Act, 21 U.S.C.section 360bbb-3(b)(1), unless the authorization is terminated  or revoked sooner.       Influenza A by PCR  NEGATIVE NEGATIVE Final   Influenza B by PCR NEGATIVE NEGATIVE Final    Comment: (NOTE) The Xpert Xpress SARS-CoV-2/FLU/RSV plus assay is intended as an aid in the diagnosis of influenza from Nasopharyngeal swab specimens and should not be used as a sole basis for treatment. Nasal washings and aspirates are unacceptable for Xpert Xpress SARS-CoV-2/FLU/RSV testing.  Fact Sheet for Patients: EntrepreneurPulse.com.au  Fact Sheet for Healthcare Providers: IncredibleEmployment.be  This test is not yet approved or cleared by the [place] FDA and has been authorized for detection and/or diagnosis of SARS-CoV-2 by FDA under an Emergency Use Authorization (EUA). This EUA will remain in effect (meaning this test can be used) for the duration of the COVID-19 declaration under Section 564(b)(1) of the Act, 21 U.S.C. section 360bbb-3(b)(1), unless the authorization is terminated or revoked.  Performed at [facility], [address] [place], [place] [address]      Scheduled Meds:  apixaban  2.5 mg Oral BID   docusate sodium  200 mg Oral QHS   feeding supplement  237 mL Oral TID BM   finasteride  5 mg Oral Daily   lidocaine  1 application Topical Once   polyethylene glycol  17  g Oral BID   senna-docusate  1 tablet Oral BID      LOS: 2 days   Cherene Altes, MD [facility] Office  [phone] Pager - Text Page per Shea Evans  If 7PM-7AM, please contact night-coverage per Amion 03/13/2021, 10:45 AM

## 2021-03-14 LAB — BASIC METABOLIC PANEL
Anion gap: 7 (ref 5–15)
BUN: 32 mg/dL — ABNORMAL HIGH (ref 8–23)
CO2: 26 mmol/L (ref 22–32)
Calcium: 8.6 mg/dL — ABNORMAL LOW (ref 8.9–10.3)
Chloride: 105 mmol/L (ref 98–111)
Creatinine, Ser: 1.23 mg/dL (ref 0.61–1.24)
GFR, Estimated: 57 mL/min — ABNORMAL LOW (ref >60)
Glucose, Bld: 90 mg/dL (ref 70–99)
Potassium: 3.6 mmol/L (ref 3.5–5.1)
Sodium: 138 mmol/L (ref 135–145)

## 2021-03-14 LAB — CBC
HCT: 35.3 % — ABNORMAL LOW (ref 39.0–52.0)
Hemoglobin: 11.9 g/dL — ABNORMAL LOW (ref 13.0–17.0)
MCH: 32.2 pg (ref 26.0–34.0)
MCHC: 33.7 g/dL (ref 30.0–36.0)
MCV: 95.4 fL (ref 80.0–100.0)
Platelets: 118 10*3/uL — ABNORMAL LOW (ref 150–400)
RBC: 3.7 MIL/uL — ABNORMAL LOW (ref 4.22–5.81)
RDW: 14.7 % (ref 11.5–15.5)
WBC: 4.8 10*3/uL (ref 4.0–10.5)
nRBC: 0 % (ref 0.0–0.2)

## 2021-03-14 LAB — RESP PANEL BY RT-PCR (FLU A&B, COVID) ARPGX2
Influenza A by PCR: NEGATIVE
Influenza B by PCR: NEGATIVE
SARS Coronavirus 2 by RT PCR: NEGATIVE

## 2021-03-14 MED ORDER — LACTULOSE 10 GM/15ML PO SOLN
30.0000 g | Freq: Two times a day (BID) | ORAL | Status: DC
  Administered 2021-03-14: 30 g via ORAL
  Filled 2021-03-14 (×3): qty 60

## 2021-03-14 MED ORDER — ACETAMINOPHEN 500 MG PO TABS: 500.0000 mg | ORAL_TABLET | Freq: Four times a day (QID) | ORAL | Status: DC | PRN

## 2021-03-14 MED ORDER — SODIUM CHLORIDE 0.9 % IV BOLUS
500.0000 mL | Freq: Once | INTRAVENOUS | Status: AC
  Administered 2021-03-14: 500 mL via INTRAVENOUS

## 2021-03-14 NOTE — Progress Notes (Addendum)
[facility]) Hospitalized Hospice Patient Visit   Louis Reid is a current hospice patient with a terminal diagnosis of hypertensive heart and chronic kidney disease with heart failure. Attempted home visit with patient and was informed by family today that the patient had been admitted to [facility] due to increased weakness. Per family, patient is unable to care for himself at home and they would like to seek placement in a facility at discharge from the hospital. Per Dr. Gilford Rile with [facility], this is a related hospital admission.    Visited patient at bedside. States he has no needs that are not being met by hospital care team. Plan remains for patient to transfer to skilled nursing facility once stable for hospital discharge. Spoke with son Louis Reid by phone. Hospice SW visited patient at bedside today. Report exchanged with hospital care team.    Patient remains inpatient appropriate in order to evaluate for increased weakness.    V/S: T-97.6, 116/80, 73, 20, 98% on RA   I/O: 240/650   Abnormal Labs:  BUN: 32 (H) Calcium: 8.6 (L) GFR, Estimated: 57 (L) RBC: 3.70 (L) Hemoglobin: 11.9 (L) HCT: 35.3 (L) Platelets: 118 (L)   Diagnostics: None   IV/PRN: None   Problem List: Severe generalized weakness -unable to care for self -FTT -protein calorie malnutrition Patient has failed an attempt to live independently at home with assistance -attempt to place in SNF   Constipation CT abdomen/pelvis noted 8 cm stool impaction in rectum - declined manual disimpaction - increase bowel regimen and follow -having some effect thus far -continue   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   Family Contact: Spoke with son Louis Reid by phone.   IDT: Updated   Goals of Care: Clear-DNR   Please do not hesitate to call with any hospice related questions or concerns.    Thank you,    Louis "Loren Racer, [place], BSN [facility] Liaison [phone]

## 2021-03-14 NOTE — Care Management Important Message (Signed)
Important Message  Patient Details  Name: Louis Reid MRN: [id number] Date of Birth: 05-18-1933   Medicare Important Message Given:  Other (see comment)  Current disposition is to discharge with hospice services.  Medicare IM withheld at this time.    Dannette Barbara 03/14/2021, 8:42 AM

## 2021-03-14 NOTE — Progress Notes (Signed)
Louis Reid  HQI:[id number] DOB: 04-28-1933 DOA: 03/11/2021 PCP: [name], MD    Brief Narrative:  [phone] with a history of CAD, advanced ischemic cardiomyopathy with EF 20% status post AICD, PAF on Eliquis, CKD stage IIIb, chronic protein calorie malnutrition, chronic constipation, and persisting FTT who's hospitalization 9/13 > 9/19 led to a Palliative Care consultation after which he was discharged home with Hospice.  He lives alone.  Patient presented to the ED 10/4 stating that he no longer felt he was safe to live alone and requesting SNF placement. He was found to be suffering w/ a significant fecal impaction at presentation.   Consultants:  None  Code Status: NO CODE BLUE  Antimicrobials:  None  DVT prophylaxis: Eliquis  Subjective: No acute events noted overnight.  Afebrile.  Vital signs stable.  Continues to report multiple small bowel movements.  Appetite improving.  No chest pain or shortness of breath.  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 and is already on Eliquis - no indication for further investigation   CAD Asymptomatic  Chronic combined systolic and diastolic congestive heart failure -ischemic cardiomyopathy Appears reasonably well compensated at present  Chronic paroxysmal atrial fibrillation Rate controlled  CKD stage IIIb Renal function stable/improved since admission  HTN Blood pressure stable   Family Communication: No family present at time of exam Status is: Inpatient  Remains inpatient  appropriate because:Unsafe d/c plan  Dispo: The patient is from: Home              Anticipated d/c is to: SNF              Patient currently is medically stable to d/c.   Difficult to place patient No    Objective: Blood pressure 116/80, pulse 73, temperature 97.6 F (36.4 C), temperature source Oral, resp. rate 20, height 5\' 11"  (1.803 m), weight 47.6 kg, SpO2 98 %.  Intake/Output Summary (Last 24 hours) at 03/14/2021 1006 Last data filed at 03/14/2021 0615 Gross per 24 hour  Intake 240 ml  Output 650 ml  Net -410 ml    Filed Weights   03/11/21 1343  Weight: 47.6 kg    Examination: General: No acute respiratory distress Lungs: Clear to auscultation bilaterally without wheezing Cardiovascular: RRR without murmur Abdomen: NT/ND, soft, BS positive, no rebound, no ascites Extremities: No edema BLE  CBC: Recent Labs  Lab 03/11/21 1347 03/14/21 0546  WBC 7.5 4.8  HGB 15.0 11.9*  HCT 43.2 35.3*  MCV 95.8 95.4  PLT 144* 118*    Basic Metabolic Panel: Recent Labs  Lab 03/11/21 1347 03/14/21 0546  NA 141 138  K 3.5 3.6  CL 105 105  CO2 27 26  GLUCOSE 94 90  BUN 36* 32*  CREATININE 1.74* 1.23  CALCIUM 9.7 8.6*    GFR: Estimated Creatinine Clearance: 29 mL/min (by C-G formula based on SCr of 1.23 mg/dL).  Liver Function Tests: Recent Labs  Lab 03/11/21 1512  AST 30  ALT 24  ALKPHOS 82  BILITOT 1.3*  PROT 6.7  ALBUMIN 4.1     HbA1C:  Hgb A1c MFr Bld  Date/Time Value Ref Range Status  05/18/2012 04:10 AM 5.9 (H) <5.7 % Final    Comment:    (NOTE)                                                                       According to the ADA Clinical Practice Recommendations for 2011, when HbA1c is used as a screening test:  >=6.5%   Diagnostic of Diabetes Mellitus           (if abnormal result is confirmed) 5.7-6.4%   Increased risk of developing Diabetes Mellitus References:Diagnosis and Classification of Diabetes  [place],Diabetes WERX,5400,86(PYPPJ 1):S62-S69 and Standards of Medical Care in         Diabetes - 2011,Diabetes KDTO,6712,45 (Suppl 1):S11-S61.     Recent Results (from the past 240 hour(s))  Urine Culture     Status: Abnormal   Collection Time: 03/11/21  1:47 PM   Specimen: Urine, Random  Result Value Ref Range Status   Specimen Description   Final    URINE, RANDOM Performed at [facility], [address], [place], [place] [address]    Special Requests   Final    NONE Performed at [facility], [place], [place] [address]    Culture 20,000 COLONIES/mL ENTEROCOCCUS FAECALIS (A)  Final   Report Status 03/13/2021 FINAL  Final   Organism ID, Bacteria ENTEROCOCCUS FAECALIS (A)  Final      Susceptibility   Enterococcus faecalis - MIC*    AMPICILLIN <=2 SENSITIVE Sensitive     NITROFURANTOIN <=16 SENSITIVE Sensitive     VANCOMYCIN 1 SENSITIVE Sensitive     * 20,000 COLONIES/mL ENTEROCOCCUS FAECALIS  Resp Panel by RT-PCR (Flu A&B, Covid) Nasopharyngeal Swab     Status: None   Collection Time: 03/11/21  3:27 PM   Specimen: Nasopharyngeal Swab; Nasopharyngeal(NP) swabs in vial transport medium  Result Value Ref Range Status   SARS Coronavirus 2 by RT PCR NEGATIVE NEGATIVE Final    Comment: (NOTE) SARS-CoV-2 target nucleic acids are NOT DETECTED.  The SARS-CoV-2 RNA is generally detectable in upper respiratory specimens during the acute phase of infection. The lowest concentration of SARS-CoV-2 viral copies this assay can detect is 138 copies/mL. A negative result does not preclude SARS-Cov-2 infection and should not be used as the sole basis for treatment or other patient management decisions. A negative result may occur with  improper specimen collection/handling, submission of specimen other than nasopharyngeal swab, presence of viral mutation(s) within the areas targeted by this assay, and inadequate number of viral copies(<138 copies/mL).  A negative result must be combined with clinical observations, patient history, and epidemiological information. The expected result is Negative.  Fact Sheet for Patients:  EntrepreneurPulse.com.au  Fact Sheet for Healthcare Providers:  IncredibleEmployment.be  This test is no t yet approved or cleared by the [place] FDA and  has been authorized for detection and/or diagnosis of SARS-CoV-2 by FDA under an Emergency Use Authorization (EUA). This EUA will remain  in effect (meaning this test can be used) for the duration of the COVID-19 declaration under Section 564(b)(1) of the Act, 21 U.S.C.section 360bbb-3(b)(1), unless the authorization is terminated  or revoked sooner.  Influenza A by PCR NEGATIVE NEGATIVE Final   Influenza B by PCR NEGATIVE NEGATIVE Final    Comment: (NOTE) The Xpert Xpress SARS-CoV-2/FLU/RSV plus assay is intended as an aid in the diagnosis of influenza from Nasopharyngeal swab specimens and should not be used as a sole basis for treatment. Nasal washings and aspirates are unacceptable for Xpert Xpress SARS-CoV-2/FLU/RSV testing.  Fact Sheet for Patients: EntrepreneurPulse.com.au  Fact Sheet for Healthcare Providers: IncredibleEmployment.be  This test is not yet approved or cleared by the [place] FDA and has been authorized for detection and/or diagnosis of SARS-CoV-2 by FDA under an Emergency Use Authorization (EUA). This EUA will remain in effect (meaning this test can be used) for the duration of the COVID-19 declaration under Section 564(b)(1) of the Act, 21 U.S.C. section 360bbb-3(b)(1), unless the authorization is terminated or revoked.  Performed at [facility], [address] [place], [place] [address]      Scheduled Meds:  apixaban  2.5 mg Oral BID   docusate sodium  200 mg Oral QHS   feeding supplement  237 mL Oral TID BM   finasteride   5 mg Oral Daily   multivitamin with minerals  1 tablet Oral Daily   polyethylene glycol  17 g Oral BID   senna-docusate  1 tablet Oral BID      LOS: 3 days   Cherene Altes, MD [facility] Office  [phone] Pager - Text Page per Shea Evans  If 7PM-7AM, please contact night-coverage per Amion 03/14/2021, 10:06 AM

## 2021-03-14 NOTE — Progress Notes (Signed)
Physical Therapy Treatment Patient Details Name: Louis Reid MRN: [id number] DOB: 04-Feb-1933 Today's Date: 03/14/2021   History of Present Illness Louis Reid is a 87 y.o. male with medical history significant for Chronically ill male with CAD, advanced ischemic cardiomyopathy, EF 20% s/p ICD, PAF on Eliquis, CKD 3B,  protein calorie malnutrition with failure to thrive and chronic constipation, hospitalized from 9/13-9/19 during which he had a palliative care consult and discharged home with hospice where he lives alone who presents with protracted weakness, inability to manage on his own at home with shortness of breath on minimal exertion and inadequate nutrition due to his symptoms.    PT Comments    Pt standing working with OT beginning of session w/ RW. Pt agreeable for OOB mobility. Overall MOD I for functional mobility, SUPV w/ RW for amb for cues in energy conversation and widening BOS. Pt notes SOB without pain with all activity, SpO2 98%, HR 85, pt edu provided on energy conservation during periods of fatigue or SOB. Progressed to standing balance activities with pt demonstrating slight instability with higher level tasks (tandem stance, SLS), but overall no LOB when positions held for short amount of time. Discharge recommendations remain Long term care w/ HHPT. Skilled PT intervention is indicated to address deficits in function, mobility, and to return to PLOF as able.    Recommendations for follow up therapy are one component of a multi-disciplinary discharge planning process, led by the attending physician.  Recommendations may be updated based on patient status, additional functional criteria and insurance authorization.  Follow Up Recommendations  Other (comment) (Long term care w/ HHPT)     Equipment Recommendations  Rolling walker with 5" wheels    Recommendations for Other Services       Precautions / Restrictions Precautions Precautions: Fall Restrictions Weight  Bearing Restrictions: No     Mobility  Bed Mobility Overal bed mobility: Independent Bed Mobility: Sit to Supine     Supine to sit: Independent Sit to supine: Modified independent (Device/Increase time)   General bed mobility comments: Pt standing working with OT beginning of session, MOD I returning to bed for increased time of task, no physical assist provided    Transfers Overall transfer level: Needs assistance Equipment used: Rolling walker (2 wheeled) Transfers: Sit to/from Stand Sit to Stand: Modified independent (Device/Increase time) Stand pivot transfers: Supervision       General transfer comment: able to stand from average seat height w/ correct handplacement on RW  Ambulation/Gait Ambulation/Gait assistance: Supervision Gait Distance (Feet): 65 Feet Assistive device: Rolling walker (2 wheeled) Gait Pattern/deviations: Step-through pattern;Decreased step length - right;Decreased step length - left;Trunk flexed Gait velocity: decreased   General Gait Details: Decreased cadence, pt noted SOB and weakness w/ amb (SpO2 98%) and denied further amb. No LOB or instability noted   Stairs             Wheelchair Mobility    Modified Rankin (Stroke Patients Only)       Balance Overall balance assessment: Needs assistance Sitting-balance support: No upper extremity supported;Feet supported Sitting balance-Leahy Scale: Good Sitting balance - Comments: Does not require UE support, able to sit and pivot to [facility]   Standing balance support: Bilateral upper extremity supported;During functional activity Standing balance-Leahy Scale: Good Standing balance comment: Able to stand without UE support naturally with wide BOS, able to stand feet closer together without LOB Single Leg Stance - Right Leg: 3 Single Leg Stance - Left Leg: 3  High Level Balance Comments: Supervision for tandem stance able to hold minimally; Single leg stance without PT assist  ~ 3 seconds L leg > R leg; Performed standing perturbations, no LOB noted w/ good overall core engagement to maintain upright standing. Pt does not feeling unstable with balance activites and can not hold positions for increased time.            Cognition Arousal/Alertness: Awake/alert Behavior During Therapy: WFL for tasks assessed/performed Overall Cognitive Status: Within Functional Limits for tasks assessed                                 General Comments: follows 1 step commands, overall cooperative and pleasant      Exercises Other Exercises Other Exercises: Pt edu: energy conservation during periods of SOB, utilizing RW    General Comments        Pertinent Vitals/Pain Pain Assessment: No/denies pain    Home Living                      Prior Function            PT Goals (current goals can now be found in the care plan section) Acute Rehab PT Goals Patient Stated Goal: to improve strength and endurance to be more independent Progress towards PT goals: Progressing toward goals    Frequency    Min 2X/week      PT Plan Current plan remains appropriate    Co-evaluation              AM-PAC PT "6 Clicks" Mobility   Outcome Measure  Help needed turning from your back to your side while in a flat bed without using bedrails?: None Help needed moving from lying on your back to sitting on the side of a flat bed without using bedrails?: None Help needed moving to and from a bed to a chair (including a wheelchair)?: None Help needed standing up from a chair using your arms (e.g., wheelchair or bedside chair)?: A Little Help needed to walk in hospital room?: A Little Help needed climbing 3-5 steps with a railing? : A Little 6 Click Score: 21    End of Session Equipment Utilized During Treatment: Gait belt Activity Tolerance: Patient tolerated treatment well Patient left: in bed;with call bell/phone within reach;with bed alarm  set Nurse Communication: Mobility status PT Visit Diagnosis: Unsteadiness on feet (R26.81);Muscle weakness (generalized) (M62.81)     Time: 6073-7106 PT Time Calculation (min) (ACUTE ONLY): 24 min  Charges:                        [employer], SPT

## 2021-03-14 NOTE — TOC Progression Note (Addendum)
Transition of Care [facility]) - Progression Note    Patient Details  Name: Louis Reid MRN: [id number] Date of Birth: Oct 23, 1932  Transition of Care [facility]) CM/SW [place], LCSW Phone Number: 03/14/2021, 12:01 PM  Clinical Narrative:   Confirmed with MD plan is for long term care with hospice, not short term rehab.  Awaiting bed offers.   2:34- Call from son Rosario Kushner. He stated they want patient to try short term rehab then transition to long term care at a SNF. He has spoken with [facility] who suggested this, and would like patient to go there. Explained this would have to be approved by insurance and he verbalized understanding. Confirmed with MD. Asked PT to see again when possible.   4:33- PT and OT have assessed patient again and he does not need SNF rehab level of care. Spoke to son who verbalized understanding, wants to proceed with plan for long term care with hospice. Patient has had both COVID vaccines and 2 boosters.  Updated Neoma Laming with [facility] who stated they should be able to take patient tomorrow. Just need COVID test.  Updated son [name] who is in agreement to DC to [facility] tomorrow.   Updated Care Team.     Expected Discharge Plan: Skilled Nursing Facility Barriers to Discharge: Continued Medical Work up  Expected Discharge Plan and Services Expected Discharge Plan: [place]                                               Social Determinants of Health (SDOH) Interventions    Readmission Risk Interventions No flowsheet data found.

## 2021-03-14 NOTE — Progress Notes (Signed)
Occupational Therapy Treatment Patient Details Name: Louis Reid MRN: [id number] DOB: May 18, 1933 Today's Date: 03/14/2021   History of present illness Louis Reid is a 87 y.o. male with medical history significant for Chronically ill male with CAD, advanced ischemic cardiomyopathy, EF 20% s/p ICD, PAF on Eliquis, CKD 3B,  protein calorie malnutrition with failure to thrive and chronic constipation, hospitalized from 9/13-9/19 during which he had a palliative care consult and discharged home with hospice where he lives alone who presents with protracted weakness, inability to manage on his own at home with shortness of breath on minimal exertion and inadequate nutrition due to his symptoms.   OT comments  Upon entering the room, pt supine in bed with RN present giving medications. Family member present but exits the room shortly after therapist arrives. Pt performs bed mobility independently from flat bed without bed rails. Pt stands without AD and no assist before taking several steps to sink for grooming tasks. Pt standing with UEs unassisted for grooming tasks at mod I level. Pt ambulating towards door to meet PT for hand off with pt utilizing RW for energy conservation with supervision.    Recommendations for follow up therapy are one component of a multi-disciplinary discharge planning process, led by the attending physician.  Recommendations may be updated based on patient status, additional functional criteria and insurance authorization.    Follow Up Recommendations  Home health OT;Supervision - Intermittent    Equipment Recommendations  None recommended by OT       Precautions / Restrictions Precautions Precautions: Fall       Mobility Bed Mobility Overal bed mobility: Independent Bed Mobility: Supine to Sit     Supine to sit: Independent     General bed mobility comments: from flat bed without use of rails    Transfers Overall transfer level: Needs  assistance Equipment used: None Transfers: Sit to/from Stand;Stand Pivot Transfers Sit to Stand: Modified independent (Device/Increase time) Stand pivot transfers: Supervision            Balance Overall balance assessment: Needs assistance Sitting-balance support: No upper extremity supported;Feet supported Sitting balance-Leahy Scale: Good     Standing balance support: Bilateral upper extremity supported;During functional activity Standing balance-Leahy Scale: Good                             ADL either performed or assessed with clinical judgement   ADL Overall ADL's : Needs assistance/impaired     Grooming: Wash/dry hands;Standing;Oral care;Supervision/safety;Modified independent Grooming Details (indicate cue type and reason): no physical assist                                     Vision Patient Visual Report: No change from baseline            Cognition Arousal/Alertness: Awake/alert Behavior During Therapy: WFL for tasks assessed/performed Overall Cognitive Status: Within Functional Limits for tasks assessed                                 General Comments: Pt A&O x4. Pt very pleasant and cooperative overall.                   Pertinent Vitals/ Pain       Pain Assessment: No/denies pain  Frequency  Min 2X/week        Progress Toward Goals  OT Goals(current goals can now be found in the care plan section)  Progress towards OT goals: Progressing toward goals  Acute Rehab OT Goals Patient Stated Goal: to improve strength and endurance to be more independent OT Goal Formulation: With patient Time For Goal Achievement: 03/26/21 Potential to Achieve Goals: Good  Plan Discharge plan remains appropriate;Frequency needs to be updated       AM-PAC OT "6 Clicks" Daily Activity     Outcome Measure   Help from another person eating meals?: None Help from another person taking care of personal  grooming?: None Help from another person toileting, which includes using toliet, bedpan, or urinal?: A Little Help from another person bathing (including washing, rinsing, drying)?: A Little Help from another person to put on and taking off regular upper body clothing?: None Help from another person to put on and taking off regular lower body clothing?: A Little 6 Click Score: 21    End of Session    OT Visit Diagnosis: Unsteadiness on feet (R26.81);Muscle weakness (generalized) (M62.81)   Activity Tolerance Patient tolerated treatment well   Patient Left  (pass off to PT)   Nurse Communication Mobility status        Time: 7588-3254 OT Time Calculation (min): 16 min  Charges: OT General Charges $OT Visit: 1 Visit OT Treatments $Self Care/Home Management : 8-22 mins  Darleen Crocker, MS, OTR/L , CBIS ascom [phone]  03/14/21, 3:28 PM

## 2021-03-15 DIAGNOSIS — R531 Weakness: Secondary | ICD-10-CM

## 2021-03-15 MED ORDER — ENSURE ENLIVE PO LIQD: 237.0000 mL | Freq: Three times a day (TID) | ORAL | 12 refills | Status: DC

## 2021-03-15 MED ORDER — LACTULOSE 10 GM/15ML PO SOLN: 30.0000 g | Freq: Two times a day (BID) | ORAL | 0 refills | Status: DC

## 2021-03-15 MED ORDER — ACETAMINOPHEN 500 MG PO TABS: 500.0000 mg | ORAL_TABLET | Freq: Four times a day (QID) | ORAL | 0 refills | Status: DC | PRN

## 2021-03-15 MED ORDER — BISACODYL 10 MG RE SUPP: 10.0000 mg | Freq: Every day | RECTAL | 0 refills | Status: DC | PRN

## 2021-03-15 MED ORDER — LACTULOSE 10 GM/15ML PO SOLN: 30.0000 g | Freq: Two times a day (BID) | ORAL | Status: DC | PRN

## 2021-03-15 MED ORDER — SENNOSIDES-DOCUSATE SODIUM 8.6-50 MG PO TABS: 1.0000 | ORAL_TABLET | Freq: Two times a day (BID) | ORAL | Status: DC

## 2021-03-15 MED ORDER — LACTULOSE 10 GM/15ML PO SOLN: 30.0000 g | Freq: Two times a day (BID) | ORAL | 0 refills | Status: DC | PRN

## 2021-03-15 MED ORDER — POLYETHYLENE GLYCOL 3350 17 G PO PACK: 17.0000 g | PACK | Freq: Two times a day (BID) | ORAL | 0 refills | Status: DC

## 2021-03-15 NOTE — Discharge Summary (Signed)
DISCHARGE SUMMARY  Louis Reid  MR#: [id number]  DOB:04/27/1933  Date of Admission: 03/11/2021 Date of Discharge: 03/15/2021  Attending Physician:Anielle Headrick Hennie Duos, MD  Patient's SAY:[id number], Louis Jews, MD  Consults: none   Disposition: D/C to SNF for long term care and Hospice    Follow-up Appts:  Follow-up Information     Attending of record at your chosen SNF Follow up.   Why: Ongoing care witll be provided by the physicain of record at your chosen SNF.                Tests Needing Follow-up: -monitor bowel regimen to assure pt does not develop recurrent constipation  -monitor volume status - lasix stopped during this admit due to dehydration   Discharge Diagnoses: Severe generalized weakness Unable to care for self / FTT Protein calorie malnutrition Constipation Splenic infarctions CAD Chronic combined systolic and diastolic congestive heart failure -ischemic cardiomyopathy Chronic paroxysmal atrial fibrillation CKD stage IIIb HTN NO CODE BLUE   Initial presentation: 87yo with a history of CAD, advanced ischemic cardiomyopathy with EF 20% status post AICD, PAF on Eliquis, CKD stage IIIb, chronic protein calorie malnutrition, chronic constipation, and persisting FTT who's hospitalization 9/13 > 9/19 led to a Palliative Care consultation after which he was discharged home with Hospice.  He lived alone.  Patient presented to the ED 10/4 stating that he no longer felt he was safe to live alone and requesting SNF placement. He was found to be suffering w/ a significant fecal impaction at presentation.   Hospital Course:  Severe generalized weakness -unable to care for self -FTT - protein calorie malnutrition Patient has failed an attempt to live independently at home with assistance - desires placement in a SNF for long term care w/ Hospice following - this has been arranged and the patient is otherwise medically stable    Constipation CT abdomen/pelvis  noted 8 cm stool impaction in rectum - declined manual disimpaction - continue bowel regimen - having slow but steady improvement in output w/ multiple bowel movements noted during this admission   Splenic infarctions Has history of same and is already on Eliquis - no indication for further investigation    CAD Asymptomatic   Chronic combined systolic and diastolic congestive heart failure -ischemic cardiomyopathy Has been well compensated during this admission   Chronic paroxysmal atrial fibrillation Rate controlled   CKD stage IIIb Renal function stable/improved since admission   HTN Blood pressure stable  Allergies as of 03/15/2021   No Known Allergies      Medication List     STOP taking these medications    cholecalciferol 1000 units tablet Commonly known as: VITAMIN D   furosemide 20 MG tablet Commonly known as: LASIX   Iron 325 (65 Fe) MG Tabs   nitroGLYCERIN 0.4 MG SL tablet Commonly known as: NITROSTAT   rosuvastatin 20 MG tablet Commonly known as: CRESTOR       TAKE these medications    acetaminophen 500 MG tablet Commonly known as: TYLENOL Take 1 tablet (500 mg total) by mouth every 6 (six) hours as needed for mild pain (or Fever >/= 101).   apixaban 2.5 MG Tabs tablet Commonly known as: Eliquis Take 1 tablet (2.5 mg total) by mouth 2 (two) times daily.   bisacodyl 10 MG suppository Commonly known as: DULCOLAX Place 1 suppository (10 mg total) rectally daily as needed for moderate constipation.   docusate sodium 100 MG capsule Commonly known as: COLACE Take 2 capsules (200  mg total) by mouth at bedtime.   feeding supplement Liqd Take 237 mLs by mouth 3 (three) times daily between meals.   finasteride 5 MG tablet Commonly known as: PROSCAR Take 5 mg by mouth daily.   ICAPS AREDS 2 PO Take 1 capsule by mouth 2 (two) times daily.   lactulose 10 GM/15ML solution Commonly known as: CHRONULAC Take 45 mLs (30 g total) by mouth 2 (two)  times daily as needed for moderate constipation.   ondansetron 4 MG tablet Commonly known as: Zofran Take 1 tablet (4 mg total) by mouth every 8 (eight) hours as needed for up to 10 doses for nausea or vomiting.   polyethylene glycol 17 g packet Commonly known as: MIRALAX / GLYCOLAX Take 17 g by mouth 2 (two) times daily.   senna-docusate 8.6-50 MG tablet Commonly known as: Senokot-S Take 1 tablet by mouth 2 (two) times daily.        Day of Discharge BP 110/68 (BP Location: Left Arm)   Pulse (!) 57   Temp 97.7 F (36.5 C) (Oral)   Resp 20   Ht 5\' 11"  (1.803 m)   Wt 47.6 kg   SpO2 99%   BMI 14.64 kg/m   Physical Exam: General: No acute respiratory distress Lungs: Clear to auscultation bilaterally without wheezes or crackles Cardiovascular: Regular rate and rhythm without murmur gallop or rub normal S1 and S2 Abdomen: Nontender, nondistended, soft, bowel sounds positive, no rebound, no ascites, no appreciable mass Extremities: No significant cyanosis, clubbing, or edema bilateral lower extremities  Basic Metabolic Panel: Recent Labs  Lab 03/11/21 1347 03/14/21 0546  NA 141 138  K 3.5 3.6  CL 105 105  CO2 27 26  GLUCOSE 94 90  BUN 36* 32*  CREATININE 1.74* 1.23  CALCIUM 9.7 8.6*    Liver Function Tests: Recent Labs  Lab 03/11/21 1512  AST 30  ALT 24  ALKPHOS 82  BILITOT 1.3*  PROT 6.7  ALBUMIN 4.1    CBC: Recent Labs  Lab 03/11/21 1347 03/14/21 0546  WBC 7.5 4.8  HGB 15.0 11.9*  HCT 43.2 35.3*  MCV 95.8 95.4  PLT 144* 118*    Recent Results (from the past 240 hour(s))  Urine Culture     Status: Abnormal   Collection Time: 03/11/21  1:47 PM   Specimen: Urine, Random  Result Value Ref Range Status   Specimen Description   Final    URINE, RANDOM Performed at [facility], [address], [place], [place] [address]    Special Requests   Final    NONE Performed at [facility], [address] [place], [place]  [address]    Culture 20,000 COLONIES/mL ENTEROCOCCUS FAECALIS (A)  Final   Report Status 03/13/2021 FINAL  Final   Organism ID, Bacteria ENTEROCOCCUS FAECALIS (A)  Final      Susceptibility   Enterococcus faecalis - MIC*    AMPICILLIN <=2 SENSITIVE Sensitive     NITROFURANTOIN <=16 SENSITIVE Sensitive     VANCOMYCIN 1 SENSITIVE Sensitive     * 20,000 COLONIES/mL ENTEROCOCCUS FAECALIS  Resp Panel by RT-PCR (Flu A&B, Covid) Nasopharyngeal Swab     Status: None   Collection Time: 03/11/21  3:27 PM   Specimen: Nasopharyngeal Swab; Nasopharyngeal(NP) swabs in vial transport medium  Result Value Ref Range Status   SARS Coronavirus 2 by RT PCR NEGATIVE NEGATIVE Final    Comment: (NOTE) SARS-CoV-2 target nucleic acids are NOT DETECTED.  The SARS-CoV-2 RNA is  generally detectable in upper respiratory specimens during the acute phase of infection. The lowest concentration of SARS-CoV-2 viral copies this assay can detect is 138 copies/mL. A negative result does not preclude SARS-Cov-2 infection and should not be used as the sole basis for treatment or other patient management decisions. A negative result may occur with  improper specimen collection/handling, submission of specimen other than nasopharyngeal swab, presence of viral mutation(s) within the areas targeted by this assay, and inadequate number of viral copies(<138 copies/mL). A negative result must be combined with clinical observations, patient history, and epidemiological information. The expected result is Negative.  Fact Sheet for Patients:  EntrepreneurPulse.com.au  Fact Sheet for Healthcare Providers:  IncredibleEmployment.be  This test is no t yet approved or cleared by the [place] FDA and  has been authorized for detection and/or diagnosis of SARS-CoV-2 by FDA under an Emergency Use Authorization (EUA). This EUA will remain  in effect (meaning this test can be used) for the duration  of the COVID-19 declaration under Section 564(b)(1) of the Act, 21 U.S.C.section 360bbb-3(b)(1), unless the authorization is terminated  or revoked sooner.       Influenza A by PCR NEGATIVE NEGATIVE Final   Influenza B by PCR NEGATIVE NEGATIVE Final    Comment: (NOTE) The Xpert Xpress SARS-CoV-2/FLU/RSV plus assay is intended as an aid in the diagnosis of influenza from Nasopharyngeal swab specimens and should not be used as a sole basis for treatment. Nasal washings and aspirates are unacceptable for Xpert Xpress SARS-CoV-2/FLU/RSV testing.  Fact Sheet for Patients: EntrepreneurPulse.com.au  Fact Sheet for Healthcare Providers: IncredibleEmployment.be  This test is not yet approved or cleared by the [place] FDA and has been authorized for detection and/or diagnosis of SARS-CoV-2 by FDA under an Emergency Use Authorization (EUA). This EUA will remain in effect (meaning this test can be used) for the duration of the COVID-19 declaration under Section 564(b)(1) of the Act, 21 U.S.C. section 360bbb-3(b)(1), unless the authorization is terminated or revoked.  Performed at [facility], [address] [place], [place] [address]   Resp Panel by RT-PCR (Flu A&B, Covid) Nasopharyngeal Swab     Status: None   Collection Time: 03/14/21  4:52 PM   Specimen: Nasopharyngeal Swab; Nasopharyngeal(NP) swabs in vial transport medium  Result Value Ref Range Status   SARS Coronavirus 2 by RT PCR NEGATIVE NEGATIVE Final    Comment: (NOTE) SARS-CoV-2 target nucleic acids are NOT DETECTED.  The SARS-CoV-2 RNA is generally detectable in upper respiratory specimens during the acute phase of infection. The lowest concentration of SARS-CoV-2 viral copies this assay can detect is 138 copies/mL. A negative result does not preclude SARS-Cov-2 infection and should not be used as the sole basis for treatment or other patient management decisions. A  negative result may occur with  improper specimen collection/handling, submission of specimen other than nasopharyngeal swab, presence of viral mutation(s) within the areas targeted by this assay, and inadequate number of viral copies(<138 copies/mL). A negative result must be combined with clinical observations, patient history, and epidemiological information. The expected result is Negative.  Fact Sheet for Patients:  EntrepreneurPulse.com.au  Fact Sheet for Healthcare Providers:  IncredibleEmployment.be  This test is no t yet approved or cleared by the [place] FDA and  has been authorized for detection and/or diagnosis of SARS-CoV-2 by FDA under an Emergency Use Authorization (EUA). This EUA will remain  in effect (meaning this test can be used) for the duration of the COVID-19 declaration under  Section 564(b)(1) of the Act, 21 U.S.C.section 360bbb-3(b)(1), unless the authorization is terminated  or revoked sooner.       Influenza A by PCR NEGATIVE NEGATIVE Final   Influenza B by PCR NEGATIVE NEGATIVE Final    Comment: (NOTE) The Xpert Xpress SARS-CoV-2/FLU/RSV plus assay is intended as an aid in the diagnosis of influenza from Nasopharyngeal swab specimens and should not be used as a sole basis for treatment. Nasal washings and aspirates are unacceptable for Xpert Xpress SARS-CoV-2/FLU/RSV testing.  Fact Sheet for Patients: EntrepreneurPulse.com.au  Fact Sheet for Healthcare Providers: IncredibleEmployment.be  This test is not yet approved or cleared by the [place] FDA and has been authorized for detection and/or diagnosis of SARS-CoV-2 by FDA under an Emergency Use Authorization (EUA). This EUA will remain in effect (meaning this test can be used) for the duration of the COVID-19 declaration under Section 564(b)(1) of the Act, 21 U.S.C. section 360bbb-3(b)(1), unless the authorization  is terminated or revoked.  Performed at [facility], [address] [place], [place] [address]       Time spent in discharge (includes decision making & examination of pt): 35 minutes  03/15/2021, 11:35 AM   Cherene Altes, MD [facility] Office  [phone]

## 2021-03-15 NOTE — TOC Transition Note (Signed)
Transition of Care [facility]) - CM/SW Discharge Note   Patient Details  Name: Louis Reid MRN: [id number] Date of Birth: [birth year]  [facility] CM/SW Contact:  Magnus Ivan, LCSW Phone Number: 03/15/2021, 2:34 PM   Clinical Narrative:   Discharge to [facility] today. Room 303. Notified Misty with [facility]. Confirmed with Admissions Worker Neoma Laming. Updated MD, RN, patient, and patient's son.  Asked RN to call report. EMS paperwork completed. EMS arranged with [facility].     Final next level of care: Skilled Nursing Facility Barriers to Discharge: Barriers Resolved   Patient Goals and CMS Choice Patient states their goals for this hospitalization and ongoing recovery are:: long term care with hospice CMS Medicare.gov Compare Post Acute Care list provided to:: Patient Choice offered to / list presented to : Patient, Adult Children  Discharge Placement              Patient chooses bed at: [facility] Patient to be transferred to facility by: [facility] Name of family member notified: [name] & patient notified Patient and family notified of of transfer: 03/15/21  Discharge Plan and Services                                     Social Determinants of Health (SDOH) Interventions     Readmission Risk Interventions No flowsheet data found.

## 2021-03-15 NOTE — Plan of Care (Signed)

## 2021-03-15 NOTE — Progress Notes (Signed)
Report given to Tarry Kos LPN of [facility], AVS placed with packet and DNR forn in envelope.

## 2021-03-17 ENCOUNTER — Encounter: Payer: Medicare Other | Admitting: Internal Medicine

## 2021-03-17 DIAGNOSIS — I255 Ischemic cardiomyopathy: Secondary | ICD-10-CM

## 2021-03-17 DIAGNOSIS — I10 Essential (primary) hypertension: Secondary | ICD-10-CM

## 2021-03-17 DIAGNOSIS — I4901 Ventricular fibrillation: Secondary | ICD-10-CM

## 2021-03-17 DIAGNOSIS — I5022 Chronic systolic (congestive) heart failure: Secondary | ICD-10-CM

## 2021-03-17 DIAGNOSIS — I480 Paroxysmal atrial fibrillation: Secondary | ICD-10-CM

## 2021-03-17 DIAGNOSIS — E785 Hyperlipidemia, unspecified: Secondary | ICD-10-CM

## 2021-03-22 ENCOUNTER — Other Ambulatory Visit: Payer: Self-pay | Admitting: Cardiology

## 2021-03-22 DIAGNOSIS — E785 Hyperlipidemia, unspecified: Secondary | ICD-10-CM

## 2021-03-31 ENCOUNTER — Encounter (HOSPITAL_COMMUNITY): Payer: Medicare Other | Admitting: Internal Medicine

## 2021-04-02 ENCOUNTER — Inpatient Hospital Stay (HOSPITAL_COMMUNITY)
Admission: EM | Admit: 2021-04-02 | Discharge: 2021-04-04 | DRG: 683 | Disposition: A | Discharge status: Home Health | Attending: Internal Medicine | Admitting: Internal Medicine

## 2021-04-02 ENCOUNTER — Other Ambulatory Visit: Payer: Self-pay

## 2021-04-02 ENCOUNTER — Encounter (HOSPITAL_COMMUNITY): Payer: Self-pay

## 2021-04-02 DIAGNOSIS — Z681 Body mass index (BMI) 19 or less, adult: Secondary | ICD-10-CM

## 2021-04-02 DIAGNOSIS — Z955 Presence of coronary angioplasty implant and graft: Secondary | ICD-10-CM

## 2021-04-02 DIAGNOSIS — I2510 Atherosclerotic heart disease of native coronary artery without angina pectoris: Secondary | ICD-10-CM | POA: Diagnosis present

## 2021-04-02 DIAGNOSIS — Z7901 Long term (current) use of anticoagulants: Secondary | ICD-10-CM

## 2021-04-02 DIAGNOSIS — N400 Enlarged prostate without lower urinary tract symptoms: Secondary | ICD-10-CM | POA: Diagnosis present

## 2021-04-02 DIAGNOSIS — D631 Anemia in chronic kidney disease: Secondary | ICD-10-CM | POA: Diagnosis present

## 2021-04-02 DIAGNOSIS — L600 Ingrowing nail: Secondary | ICD-10-CM | POA: Diagnosis present

## 2021-04-02 DIAGNOSIS — I255 Ischemic cardiomyopathy: Secondary | ICD-10-CM | POA: Diagnosis present

## 2021-04-02 DIAGNOSIS — N1832 Chronic kidney disease, stage 3b: Secondary | ICD-10-CM | POA: Diagnosis present

## 2021-04-02 DIAGNOSIS — E860 Dehydration: Secondary | ICD-10-CM | POA: Diagnosis present

## 2021-04-02 DIAGNOSIS — R64 Cachexia: Secondary | ICD-10-CM | POA: Diagnosis present

## 2021-04-02 DIAGNOSIS — Z66 Do not resuscitate: Secondary | ICD-10-CM | POA: Diagnosis present

## 2021-04-02 DIAGNOSIS — Z79899 Other long term (current) drug therapy: Secondary | ICD-10-CM

## 2021-04-02 DIAGNOSIS — I5042 Chronic combined systolic (congestive) and diastolic (congestive) heart failure: Secondary | ICD-10-CM | POA: Diagnosis present

## 2021-04-02 DIAGNOSIS — Z85828 Personal history of other malignant neoplasm of skin: Secondary | ICD-10-CM

## 2021-04-02 DIAGNOSIS — L8990 Pressure ulcer of unspecified site, unspecified stage: Secondary | ICD-10-CM | POA: Insufficient documentation

## 2021-04-02 DIAGNOSIS — D509 Iron deficiency anemia, unspecified: Secondary | ICD-10-CM | POA: Diagnosis present

## 2021-04-02 DIAGNOSIS — Z86711 Personal history of pulmonary embolism: Secondary | ICD-10-CM

## 2021-04-02 DIAGNOSIS — Z800 Family history of malignant neoplasm of digestive organs: Secondary | ICD-10-CM

## 2021-04-02 DIAGNOSIS — Z20822 Contact with and (suspected) exposure to covid-19: Secondary | ICD-10-CM | POA: Diagnosis present

## 2021-04-02 DIAGNOSIS — K5909 Other constipation: Secondary | ICD-10-CM | POA: Diagnosis present

## 2021-04-02 DIAGNOSIS — Z951 Presence of aortocoronary bypass graft: Secondary | ICD-10-CM

## 2021-04-02 DIAGNOSIS — I252 Old myocardial infarction: Secondary | ICD-10-CM

## 2021-04-02 DIAGNOSIS — I739 Peripheral vascular disease, unspecified: Secondary | ICD-10-CM

## 2021-04-02 DIAGNOSIS — N179 Acute kidney failure, unspecified: Secondary | ICD-10-CM | POA: Diagnosis not present

## 2021-04-02 DIAGNOSIS — M810 Age-related osteoporosis without current pathological fracture: Secondary | ICD-10-CM | POA: Diagnosis present

## 2021-04-02 DIAGNOSIS — R627 Adult failure to thrive: Secondary | ICD-10-CM | POA: Diagnosis present

## 2021-04-02 DIAGNOSIS — I10 Essential (primary) hypertension: Secondary | ICD-10-CM | POA: Diagnosis present

## 2021-04-02 DIAGNOSIS — R6251 Failure to thrive (child): Secondary | ICD-10-CM

## 2021-04-02 DIAGNOSIS — R531 Weakness: Secondary | ICD-10-CM

## 2021-04-02 DIAGNOSIS — L89151 Pressure ulcer of sacral region, stage 1: Secondary | ICD-10-CM | POA: Diagnosis present

## 2021-04-02 DIAGNOSIS — I480 Paroxysmal atrial fibrillation: Secondary | ICD-10-CM | POA: Diagnosis present

## 2021-04-02 DIAGNOSIS — E785 Hyperlipidemia, unspecified: Secondary | ICD-10-CM | POA: Diagnosis present

## 2021-04-02 DIAGNOSIS — E876 Hypokalemia: Secondary | ICD-10-CM | POA: Diagnosis present

## 2021-04-02 DIAGNOSIS — E46 Unspecified protein-calorie malnutrition: Secondary | ICD-10-CM | POA: Diagnosis present

## 2021-04-02 DIAGNOSIS — I130 Hypertensive heart and chronic kidney disease with heart failure and stage 1 through stage 4 chronic kidney disease, or unspecified chronic kidney disease: Secondary | ICD-10-CM | POA: Diagnosis present

## 2021-04-02 DIAGNOSIS — Z95810 Presence of automatic (implantable) cardiac defibrillator: Secondary | ICD-10-CM

## 2021-04-02 LAB — COMPREHENSIVE METABOLIC PANEL
ALT: 26 U/L (ref 0–44)
AST: 35 U/L (ref 15–41)
Albumin: 3.3 g/dL — ABNORMAL LOW (ref 3.5–5.0)
Alkaline Phosphatase: 78 U/L (ref 38–126)
Anion gap: 9 (ref 5–15)
BUN: 32 mg/dL — ABNORMAL HIGH (ref 8–23)
CO2: 22 mmol/L (ref 22–32)
Calcium: 8.9 mg/dL (ref 8.9–10.3)
Chloride: 109 mmol/L (ref 98–111)
Creatinine, Ser: 2.03 mg/dL — ABNORMAL HIGH (ref 0.61–1.24)
GFR, Estimated: 31 mL/min — ABNORMAL LOW (ref >60)
Glucose, Bld: 97 mg/dL (ref 70–99)
Potassium: 3.8 mmol/L (ref 3.5–5.1)
Sodium: 140 mmol/L (ref 135–145)
Total Bilirubin: 0.8 mg/dL (ref 0.3–1.2)
Total Protein: 5.5 g/dL — ABNORMAL LOW (ref 6.5–8.1)

## 2021-04-02 LAB — CBC WITH DIFFERENTIAL/PLATELET
Abs Immature Granulocytes: 0.03 10*3/uL (ref 0.00–0.07)
Basophils Absolute: 0 10*3/uL (ref 0.0–0.1)
Basophils Relative: 0 %
Eosinophils Absolute: 0 10*3/uL (ref 0.0–0.5)
Eosinophils Relative: 0 %
HCT: 39.3 % (ref 39.0–52.0)
Hemoglobin: 13.1 g/dL (ref 13.0–17.0)
Immature Granulocytes: 0 %
Lymphocytes Relative: 18 %
Lymphs Abs: 1.3 10*3/uL (ref 0.7–4.0)
MCH: 31.7 pg (ref 26.0–34.0)
MCHC: 33.3 g/dL (ref 30.0–36.0)
MCV: 95.2 fL (ref 80.0–100.0)
Monocytes Absolute: 0.6 10*3/uL (ref 0.1–1.0)
Monocytes Relative: 9 %
Neutro Abs: 5.2 10*3/uL (ref 1.7–7.7)
Neutrophils Relative %: 73 %
Platelets: 150 10*3/uL (ref 150–400)
RBC: 4.13 MIL/uL — ABNORMAL LOW (ref 4.22–5.81)
RDW: 15.6 % — ABNORMAL HIGH (ref 11.5–15.5)
WBC: 7.2 10*3/uL (ref 4.0–10.5)
nRBC: 0 % (ref 0.0–0.2)

## 2021-04-02 LAB — RESP PANEL BY RT-PCR (FLU A&B, COVID) ARPGX2
Influenza A by PCR: NEGATIVE
Influenza B by PCR: NEGATIVE
SARS Coronavirus 2 by RT PCR: NEGATIVE

## 2021-04-02 MED ORDER — SODIUM CHLORIDE 0.9 % IV SOLN: INTRAVENOUS | Status: DC

## 2021-04-02 MED ORDER — LACTATED RINGERS IV BOLUS
1000.0000 mL | Freq: Once | INTRAVENOUS | Status: AC
  Administered 2021-04-02: 1000 mL via INTRAVENOUS

## 2021-04-02 MED ORDER — ACETAMINOPHEN 650 MG RE SUPP: 650.0000 mg | Freq: Four times a day (QID) | RECTAL | Status: DC | PRN

## 2021-04-02 MED ORDER — ACETAMINOPHEN 325 MG PO TABS: 650.0000 mg | ORAL_TABLET | Freq: Four times a day (QID) | ORAL | Status: DC | PRN

## 2021-04-02 NOTE — H&P (Signed)
History and Physical    Louis Reid DOB: [birth year] DOA: 04/02/2021  PCP: [name], MD Patient coming from: Home  Chief Complaint: Generalized weakness  HPI: Louis Reid is a 87 y.o. male with medical history significant of CAD, advanced ischemic cardiomyopathy with EF 20% status post AICD, paroxysmal A. fib on Eliquis, CKD stage IIIb, BPH, hypertension, hyperlipidemia, splenic infarct, history of PE, chronic protein calorie malnutrition, chronic constipation, persistent FTT hospitalized 9/13-1/19 and palliative care consulted after which she was discharged home with hospice.  Presented again to the hospital on 10/4 as he did not feel safe living alone at home.  He was found to be suffering with significant fecal impaction which resolved with bowel regimen.  Lasix and statin discontinued.  He was discharged to SNF on 10/8.  He presents to the ED today complaining of generalized weakness.  Patient told ED physician that he left SNF 3 days ago as he did not like the food over there.  However, hospice RN told ED physician that patient left SNF because his long-term care insurance denied coverage of his stay.  Patient currently living at home alone.  In the ED, vital signs stable.  BUN 32, creatinine 2.0 (was 1.2 on 10/7).  Total protein 5.5, albumin 3.3.  No other significant lab abnormalities.  UA pending. Patient was given 1 L LR bolus.  Patient is very dissatisfied with the care he received at his current nursing facility.  States the only meal he was able to eat was breakfast but lunch and dinner were horrible.  Reports very poor appetite and generalized weakness.  Also he was having problems with his long-term care insurance which led him to leave his facility 3 days ago.  He is requesting placement to a different nursing facility as he is currently living alone at home.  His wife is deceased and son lives out of town.  He is not able to take care of himself at home  due to severe generalized weakness.  He is barely able to get up and walk.  Reports chronic constipation but now improving with his current bowel regimen and did have a bowel movement today.  Denies nausea, vomiting, or abdominal pain.  Denies cough, shortness of breath, or chest pain.  No other complaints.  Review of Systems:  All systems reviewed and apart from history of presenting illness, are negative.  Past Medical History:  Diagnosis Date   AICD (automatic cardioverter/defibrillator) present 09/13/2012   Anemia    CAD (coronary artery disease)    a. s/p remote CABG, b. AL STEMI c/b VF arrest in 05/2012 => LHC 05/10/12: Severe 3v CAD, S-OM1/2 ok, SVG-Dx ok, SVG-RCA occluded, LIMA-LAD atretic, proximal LAD occluded. EF 20% with anterior apical HK=> salvage PCI with POBA and thrombectomy of the occluded LAD;  c. 01/2017: atretic LIMA_LAD and known occluded SVG-RCA. Patent LAD stent and 70% proxRCA stenosis (FFR 0.88).    Chronic systolic heart failure ([place])    a. Echo 12/13: EF 20-25%, anteroseptal, inferior, apical HK, mildly reduced RV function, trivial pericardial effusion;   b.  Echo 3/14:  Ant, septal, apical AK, EF 25%   Clotting disorder (HCC)    Depression    "years ago" (02/28/2018)   History of kidney stones    HLD (hyperlipidemia)    Hypertension    "I've never had high blood pressure; on RX for other reasons" (02/28/2018)   Inguinal hernia    "have one now on my left  side" (09/13/2012); (02/28/2018)   Ischemic cardiomyopathy    Myocardial infarction [facility]) 1996;  05/2012   Osteoporosis    Paroxysmal atrial fibrillation ([place]) 03/17/2016   noted on device interrogation,  will follow burden   Pectus excavatum    Pulmonary embolism ([employer])    a. after adhesiolysis for SBO in 04/2012 => coumadin for 6 mos   Renal cyst    SBO (small bowel obstruction) [facility]) November 2013   s/p lysis of adhesions   Sinoatrial node dysfunction ([place])    Skin cancer    "left ear; face" (02/28/2018)    Ventricular fibrillation ([place]) 05/2012   . in setting of AL STEMI 12/13 => EF 20-25% => d/c on LifeVest (repeat echo planned in 08/2012)    Past Surgical History:  Procedure Laterality Date   CATARACT EXTRACTION W/ INTRAOCULAR LENS  IMPLANT, BILATERAL Bilateral    COLONOSCOPY     CORONARY ANGIOPLASTY WITH STENT PLACEMENT     "I've got 2 or 3 stents" (02/28/2018)   [place]   "CABG X5" (09/13/2012)   CYSTOSCOPY/RETROGRADE/URETEROSCOPY/STONE EXTRACTION WITH BASKET  1990's   ESOPHAGOGASTRODUODENOSCOPY  05/08/2012   Procedure: ESOPHAGOGASTRODUODENOSCOPY (EGD);  Surgeon: Juanita Craver, MD;  Location[facility] ENDOSCOPY;  Service: Endoscopy;  Laterality: N/A;  Nevin Bloodgood put on for Dr. Collene Mares , Dr. Collene Mares will call if she wants another time   IMPLANTABLE CARDIOVERTER DEFIBRILLATOR IMPLANT N/A 09/13/2012   SJM Ellipse ER implanted by Dr Rayann Heman for secondary prevention   INGUINAL HERNIA REPAIR Right 1996   INTRAVASCULAR PRESSURE WIRE/FFR STUDY N/A 01/20/2017   Procedure: INTRAVASCULAR PRESSURE WIRE/FFR STUDY;  Surgeon: Wellington Hampshire, MD;  Location: [place] CV LAB;  Service: Cardiovascular;  Laterality: N/A;   LAPAROTOMY  04/27/2012   Procedure: EXPLORATORY LAPAROTOMY;  Surgeon: Gwenyth Ober, MD;  Location: [place];  Service: General;  Laterality: N/A;   LEFT HEART CATH AND CORS/GRAFTS ANGIOGRAPHY N/A 01/20/2017   Procedure: LEFT HEART CATH AND CORS/GRAFTS ANGIOGRAPHY;  Surgeon: Wellington Hampshire, MD;  Location: [place] CV LAB;  Service: Cardiovascular;  Laterality: N/A;   LEFT HEART CATHETERIZATION WITH CORONARY ANGIOGRAM N/A 05/10/2012   Procedure: LEFT HEART CATHETERIZATION WITH CORONARY ANGIOGRAM;  Surgeon: Burnell Blanks, MD;  Location: [facility] CATH LAB;  Service: Cardiovascular;  Laterality: N/A;   PTCA     percutaneous transluminal coronary intervention and brachy therapy, Bruce R. Olevia Perches, MD. EF 60%   RIGHT HEART CATH N/A 03/01/2018   Procedure: RIGHT HEART CATH;  Surgeon:  Jolaine Artist, MD;  Location: [place] CV LAB;  Service: Cardiovascular;  Laterality: N/A;   SKIN CANCER EXCISION     "left ear; face" (02/28/2018)   TRANSURETHRAL RESECTION OF PROSTATE       reports that he has quit smoking. His smoking use included cigarettes. He has never used smokeless tobacco. He reports that he does not drink alcohol and does not use drugs.  No Known Allergies  Family History  Problem Relation Age of Onset   Heart disease Father    Other Mother        brain tumor   Colon cancer Brother    Cancer Brother        Liver   Heart disease Brother     Prior to Admission medications   Medication Sig Start Date End Date Taking? Authorizing Provider  acetaminophen (TYLENOL) 500 MG tablet Take 1 tablet (500 mg total) by mouth every 6 (six) hours as needed for mild pain (or  Fever >/= 101). 03/15/21   Cherene Altes, MD  apixaban (ELIQUIS) 2.5 MG TABS tablet Take 1 tablet (2.5 mg total) by mouth 2 (two) times daily. 07/09/20   Bensimhon, Shaune Pascal, MD  bisacodyl (DULCOLAX) 10 MG suppository Place 1 suppository (10 mg total) rectally daily as needed for moderate constipation. 03/15/21   Cherene Altes, MD  docusate sodium (COLACE) 100 MG capsule Take 2 capsules (200 mg total) by mouth at bedtime. 02/24/21   Domenic Polite, MD  feeding supplement (ENSURE ENLIVE / ENSURE PLUS) LIQD Take 237 mLs by mouth 3 (three) times daily between meals. 03/15/21   Cherene Altes, MD  finasteride (PROSCAR) 5 MG tablet Take 5 mg by mouth daily. 08/30/19   [provider]  lactulose (CHRONULAC) 10 GM/15ML solution Take 45 mLs (30 g total) by mouth 2 (two) times daily as needed for moderate constipation. 03/15/21   Cherene Altes, MD  Multiple Vitamins-Minerals (ICAPS AREDS 2 PO) Take 1 capsule by mouth 2 (two) times daily.    [provider]  ondansetron (ZOFRAN) 4 MG tablet Take 1 tablet (4 mg total) by mouth every 8 (eight) hours as needed for up to 10 doses  for nausea or vomiting. 03/11/21   Lucrezia Starch, MD  polyethylene glycol (MIRALAX / GLYCOLAX) 17 g packet Take 17 g by mouth 2 (two) times daily. 03/15/21   Cherene Altes, MD  senna-docusate (SENOKOT-S) 8.6-50 MG tablet Take 1 tablet by mouth 2 (two) times daily. 03/15/21   Cherene Altes, MD    Physical Exam: Vitals:   04/02/21 1900 04/02/21 1930 04/02/21 2000 04/02/21 2030  BP: 136/83 (!) 145/97 124/89 137/83  Pulse: 63 91 62 62  Resp:    17  Temp:      SpO2: 98% 99% 97% 99%  Weight:      Height:        Physical Exam Constitutional:      General: He is not in acute distress. HENT:     Head: Normocephalic and atraumatic.     Mouth/Throat:     Mouth: Mucous membranes are dry.  Eyes:     Extraocular Movements: Extraocular movements intact.     Conjunctiva/sclera: Conjunctivae normal.  Cardiovascular:     Rate and Rhythm: Normal rate and regular rhythm.     Pulses: Normal pulses.  Pulmonary:     Effort: Pulmonary effort is normal. No respiratory distress.     Breath sounds: Normal breath sounds. No wheezing or rales.  Abdominal:     General: Bowel sounds are normal. There is no distension.     Palpations: Abdomen is soft.     Tenderness: There is no abdominal tenderness.  Musculoskeletal:     Cervical back: Normal range of motion and neck supple.     Comments: Mild bilateral pedal edema  Skin:    General: Skin is warm and dry.  Neurological:     General: No focal deficit present.     Mental Status: He is alert and oriented to person, place, and time.     Labs on Admission: I have personally reviewed following labs and imaging studies  CBC: Recent Labs  Lab 04/02/21 1913  WBC 7.2  NEUTROABS 5.2  HGB 13.1  HCT 39.3  MCV 95.2  PLT 846   Basic Metabolic Panel: Recent Labs  Lab 04/02/21 1913  NA 140  K 3.8  CL 109  CO2 22  GLUCOSE 97  BUN 32*  CREATININE  2.03*  CALCIUM 8.9   GFR: Estimated Creatinine Clearance: 16.8 mL/min (A) (by C-G  formula based on SCr of 2.03 mg/dL (H)). Liver Function Tests: Recent Labs  Lab 04/02/21 1913  AST 35  ALT 26  ALKPHOS 78  BILITOT 0.8  PROT 5.5*  ALBUMIN 3.3*   No results for input(s): LIPASE, AMYLASE in the last 168 hours. No results for input(s): AMMONIA in the last 168 hours. Coagulation Profile: No results for input(s): INR, PROTIME in the last 168 hours. Cardiac Enzymes: No results for input(s): CKTOTAL, CKMB, CKMBINDEX, TROPONINI in the last 168 hours. BNP (last 3 results) No results for input(s): PROBNP in the last 8760 hours. HbA1C: No results for input(s): HGBA1C in the last 72 hours. CBG: No results for input(s): GLUCAP in the last 168 hours. Lipid Profile: No results for input(s): CHOL, HDL, LDLCALC, TRIG, CHOLHDL, LDLDIRECT in the last 72 hours. Thyroid Function Tests: No results for input(s): TSH, T4TOTAL, FREET4, T3FREE, THYROIDAB in the last 72 hours. Anemia Panel: No results for input(s): VITAMINB12, FOLATE, FERRITIN, TIBC, IRON, RETICCTPCT in the last 72 hours. Urine analysis:    Component Value Date/Time   COLORURINE YELLOW (A) 03/11/2021 1347   APPEARANCEUR HAZY (A) 03/11/2021 1347   LABSPEC 1.012 03/11/2021 1347   PHURINE 5.0 03/11/2021 1347   GLUCOSEU NEGATIVE 03/11/2021 1347   HGBUR NEGATIVE 03/11/2021 1347   BILIRUBINUR NEGATIVE 03/11/2021 1347   KETONESUR NEGATIVE 03/11/2021 1347   PROTEINUR 30 (A) 03/11/2021 1347   UROBILINOGEN 1.0 05/24/2012 1330   NITRITE NEGATIVE 03/11/2021 1347   LEUKOCYTESUR TRACE (A) 03/11/2021 1347    Radiological Exams on Admission: No results found.  EKG: Independently reviewed.  Sinus rhythm, borderline PR prolongation, borderline QT prolongation.  T wave abnormality in lateral leads similar to prior tracing from 02/18/2021.  Assessment/Plan Principal Problem:   AKI (acute kidney injury) ([place]) Active Problems:   Essential hypertension   Coronary artery disease   Generalized weakness   Paroxysmal atrial  fibrillation (HCC)   AKI on CKD stage IIIb Likely prerenal azotemia from dehydration given poor oral intake.  BUN 32, creatinine 2.0 (was 1.2 on 10/7). -Continue gentle IV fluid hydration.  Monitor renal function and urine output.  Avoid nephrotoxic agents and contrast.  Severe generalized weakness, failure to thrive, protein calorie malnutrition Symptoms likely secondary to severe cardiomyopathy/ cardiac cachexia.  Patient left SNF 3 days ago due to dissatisfaction with care and denial of coverage by his long-term care insurance.  Currently living at home alone and does not have any help.  Requesting placement to another SNF. -PT/OT eval, TOC consulted.  Dietitian consulted.  CAD Stable.  Not endorsing any anginal symptoms at present. -Continue to monitor  Chronic combined CHF s/p AICD EF 20-25% on echo done 01/08/2020.  Lasix stopped during hospitalization earlier this month due to concern for dehydration.  Has mild bilateral pedal edema but lungs clear on exam. -Receiving gentle IV fluid hydration due to AKI, monitor volume status closely  Paroxysmal A. Fib Stable. -Resume Eliquis after pharmacy med rec is done.  Chronic constipation Stable. -Resume home meds after pharmacy med rec is done.  Hypertension Stable.  Not on any antihypertensives. -Continue to monitor  BPH -Resume home medication after pharmacy med rec is done.  DVT prophylaxis: Resume Eliquis after pharmacy med rec is done. Code Status: DNR-confirmed with the patient Family Communication: No family available at this time. Disposition Plan: Status is: Observation  The patient remains OBS appropriate and will d/c before 2  midnights.  Level of care: Level of care: Med-Surg  The medical decision making on this patient was of high complexity and the patient is at high risk for clinical deterioration, therefore this is a level 3 visit.  Shela Leff MD [facility]  If 7PM-7AM, please contact  night-coverage [email or web address]  04/02/2021, 9:52 PM

## 2021-04-02 NOTE — ED Triage Notes (Signed)
Patient brought in via ems from home. Patient lives alone with hospice care who seen him today. Patient states he is wore out and fatigued for months. States he has a decreased appetite and some nausea. Thinks he needs fluids and placement

## 2021-04-02 NOTE — ED Provider Notes (Signed)
[facility] EMERGENCY DEPARTMENT Provider Note  CSN: [id number] Arrival date & time: 04/02/21 1839    History Chief Complaint  Patient presents with   Fatigue    BLEU MINERD is a 87 y.o. male with CHF, low EF, had recent admission for FTT in Sept, palliative care was consulted and he ultimately went home with Hospice where he did not do well. Re-admitted earlier this month and discharged to a SNF about 2 weeks ago. He did not like the food at his SNF so he checked himself out and went home 3 days ago, Hospice has still been coming to see him. Today he reports he has not had any appetite, generally weak. He came back to the ED to be placed in a different facility.He does not have any family in the area and was otherwise living at home alone. He denies any fever. No pain, no vomiting. Had a BM this morning.    Past Medical History:  Diagnosis Date   AICD (automatic cardioverter/defibrillator) present 09/13/2012   Anemia    CAD (coronary artery disease)    a. s/p remote CABG, b. AL STEMI c/b VF arrest in 05/2012 => LHC 05/10/12: Severe 3v CAD, S-OM1/2 ok, SVG-Dx ok, SVG-RCA occluded, LIMA-LAD atretic, proximal LAD occluded. EF 20% with anterior apical HK=> salvage PCI with POBA and thrombectomy of the occluded LAD;  c. 01/2017: atretic LIMA_LAD and known occluded SVG-RCA. Patent LAD stent and 70% proxRCA stenosis (FFR 0.88).    Chronic systolic heart failure ([place])    a. Echo 12/13: EF 20-25%, anteroseptal, inferior, apical HK, mildly reduced RV function, trivial pericardial effusion;   b.  Echo 3/14:  Ant, septal, apical AK, EF 25%   Clotting disorder ([place])    Depression    "years ago" (02/28/2018)   History of kidney stones    HLD (hyperlipidemia)    Hypertension    "I've never had high blood pressure; on RX for other reasons" (02/28/2018)   Inguinal hernia    "have one now on my left side" (09/13/2012); (02/28/2018)   Ischemic cardiomyopathy    Myocardial infarction [facility]) 1996;   05/2012   Osteoporosis    Paroxysmal atrial fibrillation ([place]) 03/17/2016   noted on device interrogation,  will follow burden   Pectus excavatum    Pulmonary embolism ([place])    a. after adhesiolysis for SBO in 04/2012 => coumadin for 6 mos   Renal cyst    SBO (small bowel obstruction) [facility]) November 2013   s/p lysis of adhesions   Sinoatrial node dysfunction ([place])    Skin cancer    "left ear; face" (02/28/2018)   Ventricular fibrillation ([place]) 05/2012   . in setting of AL STEMI 12/13 => EF 20-25% => d/c on LifeVest (repeat echo planned in 08/2012)    Past Surgical History:  Procedure Laterality Date   CATARACT EXTRACTION W/ INTRAOCULAR LENS  IMPLANT, BILATERAL Bilateral    COLONOSCOPY     CORONARY ANGIOPLASTY WITH STENT PLACEMENT     "I've got 2 or 3 stents" (02/28/2018)   [place]   "CABG X5" (09/13/2012)   CYSTOSCOPY/RETROGRADE/URETEROSCOPY/STONE EXTRACTION WITH BASKET  1990's   ESOPHAGOGASTRODUODENOSCOPY  05/08/2012   Procedure: ESOPHAGOGASTRODUODENOSCOPY (EGD);  Surgeon: Juanita Craver, MD;  Location: [facility] ENDOSCOPY;  Service: Endoscopy;  Laterality: N/A;  Nevin Bloodgood put on for Dr. Collene Mares , Dr. Collene Mares will call if she wants another time   IMPLANTABLE CARDIOVERTER DEFIBRILLATOR IMPLANT N/A 09/13/2012   SJM Ellipse ER  implanted by Dr Rayann Heman for secondary prevention   INGUINAL HERNIA REPAIR Right 1996   INTRAVASCULAR PRESSURE WIRE/FFR STUDY N/A 01/20/2017   Procedure: INTRAVASCULAR PRESSURE WIRE/FFR STUDY;  Surgeon: Wellington Hampshire, MD;  Location: [facility] CV LAB;  Service: Cardiovascular;  Laterality: N/A;   LAPAROTOMY  04/27/2012   Procedure: EXPLORATORY LAPAROTOMY;  Surgeon: Gwenyth Ober, MD;  Location: [facility];  Service: General;  Laterality: N/A;   LEFT HEART CATH AND CORS/GRAFTS ANGIOGRAPHY N/A 01/20/2017   Procedure: LEFT HEART CATH AND CORS/GRAFTS ANGIOGRAPHY;  Surgeon: Wellington Hampshire, MD;  Location: [facility] CV LAB;  Service: Cardiovascular;  Laterality: N/A;    LEFT HEART CATHETERIZATION WITH CORONARY ANGIOGRAM N/A 05/10/2012   Procedure: LEFT HEART CATHETERIZATION WITH CORONARY ANGIOGRAM;  Surgeon: Burnell Blanks, MD;  Location: [facility] CATH LAB;  Service: Cardiovascular;  Laterality: N/A;   PTCA     percutaneous transluminal coronary intervention and brachy therapy, Bruce R. Olevia Perches, MD. EF 60%   RIGHT HEART CATH N/A 03/01/2018   Procedure: RIGHT HEART CATH;  Surgeon: Jolaine Artist, MD;  Location: [place] CV LAB;  Service: Cardiovascular;  Laterality: N/A;   SKIN CANCER EXCISION     "left ear; face" (02/28/2018)   TRANSURETHRAL RESECTION OF PROSTATE      Family History  Problem Relation Age of Onset   Heart disease Father    Other Mother        brain tumor   Colon cancer Brother    Cancer Brother        Liver   Heart disease Brother     Social History   Tobacco Use   Smoking status: Former    Types: Cigarettes   Smokeless tobacco: Never   Tobacco comments:    09/14/2011; 02/28/2018  "quit smoking when I was ~ 17; probably didn't smoke 10 packs in my life"  Vaping Use   Vaping Use: Never used  Substance Use Topics   Alcohol use: Never   Drug use: Never     Home Medications Prior to Admission medications   Medication Sig Start Date End Date Taking? Authorizing Provider  acetaminophen (TYLENOL) 500 MG tablet Take 1 tablet (500 mg total) by mouth every 6 (six) hours as needed for mild pain (or Fever >/= 101). 03/15/21   Cherene Altes, MD  apixaban (ELIQUIS) 2.5 MG TABS tablet Take 1 tablet (2.5 mg total) by mouth 2 (two) times daily. 07/09/20   Bensimhon, Shaune Pascal, MD  bisacodyl (DULCOLAX) 10 MG suppository Place 1 suppository (10 mg total) rectally daily as needed for moderate constipation. 03/15/21   Cherene Altes, MD  docusate sodium (COLACE) 100 MG capsule Take 2 capsules (200 mg total) by mouth at bedtime. 02/24/21   Domenic Polite, MD  feeding supplement (ENSURE ENLIVE / ENSURE PLUS) LIQD Take 237 mLs by mouth  3 (three) times daily between meals. 03/15/21   Cherene Altes, MD  finasteride (PROSCAR) 5 MG tablet Take 5 mg by mouth daily. 08/30/19   [provider]  lactulose (CHRONULAC) 10 GM/15ML solution Take 45 mLs (30 g total) by mouth 2 (two) times daily as needed for moderate constipation. 03/15/21   Cherene Altes, MD  Multiple Vitamins-Minerals (ICAPS AREDS 2 PO) Take 1 capsule by mouth 2 (two) times daily.    [provider]  ondansetron (ZOFRAN) 4 MG tablet Take 1 tablet (4 mg total) by mouth every 8 (eight) hours as needed for up to 10 doses for nausea  or vomiting. 03/11/21   Lucrezia Starch, MD  polyethylene glycol (MIRALAX / GLYCOLAX) 17 g packet Take 17 g by mouth 2 (two) times daily. 03/15/21   Cherene Altes, MD  senna-docusate (SENOKOT-S) 8.6-50 MG tablet Take 1 tablet by mouth 2 (two) times daily. 03/15/21   Cherene Altes, MD     Allergies    Patient has no known allergies.   Review of Systems   Review of Systems A comprehensive review of systems was completed and negative except as noted in HPI.    Physical Exam BP 137/83   Pulse 62   Temp 97.6 F (36.4 C)   Resp 17   Ht 5\' 11"  (1.803 m)   Wt 45.4 kg   SpO2 99%   BMI 13.95 kg/m   Physical Exam Vitals and nursing note reviewed.  Constitutional:      Comments: cachectic  HENT:     Head: Normocephalic and atraumatic.     Nose: Nose normal.     Mouth/Throat:     Mouth: Mucous membranes are moist.  Eyes:     Extraocular Movements: Extraocular movements intact.     Conjunctiva/sclera: Conjunctivae normal.  Cardiovascular:     Rate and Rhythm: Normal rate.  Pulmonary:     Effort: Pulmonary effort is normal.     Breath sounds: Normal breath sounds.  Abdominal:     General: Abdomen is flat.     Palpations: Abdomen is soft.     Tenderness: There is no abdominal tenderness. There is no guarding.  Musculoskeletal:        General: No swelling. Normal range of motion.     Cervical back:  Neck supple.  Skin:    General: Skin is warm and dry.  Neurological:     General: No focal deficit present.     Mental Status: He is alert.  Psychiatric:        Mood and Affect: Mood normal.     ED Results / Procedures / Treatments   Labs (all labs ordered are listed, but only abnormal results are displayed) Labs Reviewed  COMPREHENSIVE METABOLIC PANEL - Abnormal; Notable for the following components:      Result Value   BUN 32 (*)    Creatinine, Ser 2.03 (*)    Total Protein 5.5 (*)    Albumin 3.3 (*)    GFR, Estimated 31 (*)    All other components within normal limits  CBC WITH DIFFERENTIAL/PLATELET - Abnormal; Notable for the following components:   RBC 4.13 (*)    RDW 15.6 (*)    All other components within normal limits  URINALYSIS, ROUTINE W REFLEX MICROSCOPIC    EKG EKG Interpretation  Date/Time:  Wednesday April 02 2021 20:47:51 EDT Ventricular Rate:  87 PR Interval:  218 QRS Duration: 93 QT Interval:  411 QTC Calculation: 495 R Axis:   105 Text Interpretation: Sinus arrhythmia Borderline prolonged PR interval Consider left atrial enlargement Right axis deviation Low voltage, extremity leads Abnormal R-wave progression, early transition Nonspecific T abnormalities, lateral leads Borderline prolonged QT interval Confirmed by Calvert Cantor [phone]) on 04/02/2021 8:52:34 PM  Radiology No results found.  Procedures Procedures  Medications Ordered in the ED Medications  lactated ringers bolus 1,000 mL (1,000 mLs Intravenous New Bag/Given 04/02/21 2055)     MDM Rules/Calculators/A&P MDM Patient with continued FTT at home, left SNF 3 days ago due to dissatisfaction with their care and food. He presents today with essentially the same presentation  as previously. Will check labs, give IVF and reassess.   ED Course  I have reviewed the triage vital signs and the nursing notes.  Pertinent labs & imaging results that were available during my care of the  patient were reviewed by me and considered in my medical decision making (see chart for details).  Clinical Course as of 04/02/21 2130  Wed Apr 02, 2021  1949 Attempted to contact Hospice without answer. I will call back again.  [CS]  1949 Spoke with Hospice RN who reports their notes indicate the patient left SNF because his long term care insurance denied coverage of his stay. He states that has been resolved now.  [CS]  2026 CBC is normal.  [CS]  2112 CMP with an AKI, worsened from most recent discharge.   [CS]    Clinical Course User Index [CS] Truddie Hidden, MD    Final Clinical Impression(s) / ED Diagnoses Final diagnoses:  AKI (acute kidney injury) ([place])  Failure to thrive (child)  FTT (failure to thrive) in adult    Rx / DC Orders ED Discharge Orders     None        Truddie Hidden, MD 04/02/21 2130

## 2021-04-03 DIAGNOSIS — E860 Dehydration: Secondary | ICD-10-CM | POA: Diagnosis present

## 2021-04-03 DIAGNOSIS — I252 Old myocardial infarction: Secondary | ICD-10-CM | POA: Diagnosis not present

## 2021-04-03 DIAGNOSIS — R64 Cachexia: Secondary | ICD-10-CM | POA: Diagnosis present

## 2021-04-03 DIAGNOSIS — I10 Essential (primary) hypertension: Secondary | ICD-10-CM

## 2021-04-03 DIAGNOSIS — Z66 Do not resuscitate: Secondary | ICD-10-CM | POA: Diagnosis present

## 2021-04-03 DIAGNOSIS — I480 Paroxysmal atrial fibrillation: Secondary | ICD-10-CM | POA: Diagnosis present

## 2021-04-03 DIAGNOSIS — M810 Age-related osteoporosis without current pathological fracture: Secondary | ICD-10-CM | POA: Diagnosis present

## 2021-04-03 DIAGNOSIS — R627 Adult failure to thrive: Secondary | ICD-10-CM | POA: Diagnosis present

## 2021-04-03 DIAGNOSIS — L89151 Pressure ulcer of sacral region, stage 1: Secondary | ICD-10-CM | POA: Diagnosis present

## 2021-04-03 DIAGNOSIS — N1832 Chronic kidney disease, stage 3b: Secondary | ICD-10-CM | POA: Diagnosis present

## 2021-04-03 DIAGNOSIS — E876 Hypokalemia: Secondary | ICD-10-CM | POA: Diagnosis present

## 2021-04-03 DIAGNOSIS — R531 Weakness: Secondary | ICD-10-CM | POA: Diagnosis not present

## 2021-04-03 DIAGNOSIS — I130 Hypertensive heart and chronic kidney disease with heart failure and stage 1 through stage 4 chronic kidney disease, or unspecified chronic kidney disease: Secondary | ICD-10-CM | POA: Diagnosis present

## 2021-04-03 DIAGNOSIS — D509 Iron deficiency anemia, unspecified: Secondary | ICD-10-CM | POA: Diagnosis present

## 2021-04-03 DIAGNOSIS — D631 Anemia in chronic kidney disease: Secondary | ICD-10-CM | POA: Diagnosis present

## 2021-04-03 DIAGNOSIS — N179 Acute kidney failure, unspecified: Secondary | ICD-10-CM | POA: Diagnosis present

## 2021-04-03 DIAGNOSIS — I25810 Atherosclerosis of coronary artery bypass graft(s) without angina pectoris: Secondary | ICD-10-CM

## 2021-04-03 DIAGNOSIS — K5909 Other constipation: Secondary | ICD-10-CM | POA: Diagnosis present

## 2021-04-03 DIAGNOSIS — Z681 Body mass index (BMI) 19 or less, adult: Secondary | ICD-10-CM | POA: Diagnosis not present

## 2021-04-03 DIAGNOSIS — I2510 Atherosclerotic heart disease of native coronary artery without angina pectoris: Secondary | ICD-10-CM | POA: Diagnosis present

## 2021-04-03 DIAGNOSIS — E46 Unspecified protein-calorie malnutrition: Secondary | ICD-10-CM | POA: Diagnosis present

## 2021-04-03 DIAGNOSIS — E785 Hyperlipidemia, unspecified: Secondary | ICD-10-CM | POA: Diagnosis present

## 2021-04-03 DIAGNOSIS — Z20822 Contact with and (suspected) exposure to covid-19: Secondary | ICD-10-CM | POA: Diagnosis present

## 2021-04-03 DIAGNOSIS — I255 Ischemic cardiomyopathy: Secondary | ICD-10-CM | POA: Diagnosis present

## 2021-04-03 DIAGNOSIS — N400 Enlarged prostate without lower urinary tract symptoms: Secondary | ICD-10-CM | POA: Diagnosis present

## 2021-04-03 DIAGNOSIS — I5042 Chronic combined systolic (congestive) and diastolic (congestive) heart failure: Secondary | ICD-10-CM | POA: Diagnosis present

## 2021-04-03 DIAGNOSIS — L600 Ingrowing nail: Secondary | ICD-10-CM | POA: Diagnosis present

## 2021-04-03 LAB — BASIC METABOLIC PANEL
Anion gap: 5 (ref 5–15)
BUN: 30 mg/dL — ABNORMAL HIGH (ref 8–23)
CO2: 24 mmol/L (ref 22–32)
Calcium: 8.3 mg/dL — ABNORMAL LOW (ref 8.9–10.3)
Chloride: 111 mmol/L (ref 98–111)
Creatinine, Ser: 1.76 mg/dL — ABNORMAL HIGH (ref 0.61–1.24)
GFR, Estimated: 37 mL/min — ABNORMAL LOW (ref >60)
Glucose, Bld: 82 mg/dL (ref 70–99)
Potassium: 3.2 mmol/L — ABNORMAL LOW (ref 3.5–5.1)
Sodium: 140 mmol/L (ref 135–145)

## 2021-04-03 LAB — URINALYSIS, ROUTINE W REFLEX MICROSCOPIC
Bilirubin Urine: NEGATIVE
Glucose, UA: NEGATIVE mg/dL
Hgb urine dipstick: NEGATIVE
Ketones, ur: NEGATIVE mg/dL
Leukocytes,Ua: NEGATIVE
Nitrite: NEGATIVE
Protein, ur: NEGATIVE mg/dL
Specific Gravity, Urine: 1.011 (ref 1.005–1.030)
pH: 5 (ref 5.0–8.0)

## 2021-04-03 LAB — CBG MONITORING, ED: Glucose-Capillary: 147 mg/dL — ABNORMAL HIGH (ref 70–99)

## 2021-04-03 MED ORDER — DOCUSATE SODIUM 100 MG PO CAPS
200.0000 mg | ORAL_CAPSULE | Freq: Every day | ORAL | Status: DC
  Administered 2021-04-03: 200 mg via ORAL
  Filled 2021-04-03: qty 2

## 2021-04-03 MED ORDER — FERROUS SULFATE 325 (65 FE) MG PO TABS
325.0000 mg | ORAL_TABLET | Freq: Every day | ORAL | Status: DC
  Administered 2021-04-04: 325 mg via ORAL
  Filled 2021-04-03: qty 1

## 2021-04-03 MED ORDER — APIXABAN 2.5 MG PO TABS
2.5000 mg | ORAL_TABLET | Freq: Two times a day (BID) | ORAL | Status: DC
  Administered 2021-04-03 – 2021-04-04 (×2): 2.5 mg via ORAL
  Filled 2021-04-03 (×2): qty 1

## 2021-04-03 MED ORDER — VITAMIN B-12 1000 MCG PO TABS
1000.0000 ug | ORAL_TABLET | Freq: Every day | ORAL | Status: DC
  Administered 2021-04-03 – 2021-04-04 (×2): 1000 ug via ORAL
  Filled 2021-04-03 (×2): qty 1

## 2021-04-03 MED ORDER — FINASTERIDE 5 MG PO TABS
5.0000 mg | ORAL_TABLET | Freq: Every day | ORAL | Status: DC
  Administered 2021-04-03 – 2021-04-04 (×2): 5 mg via ORAL
  Filled 2021-04-03 (×2): qty 1

## 2021-04-03 MED ORDER — SENNOSIDES-DOCUSATE SODIUM 8.6-50 MG PO TABS: 2.0000 | ORAL_TABLET | Freq: Every evening | ORAL | Status: DC | PRN

## 2021-04-03 MED ORDER — ENSURE ENLIVE PO LIQD
237.0000 mL | Freq: Three times a day (TID) | ORAL | Status: DC
  Administered 2021-04-03: 237 mL via ORAL

## 2021-04-03 MED ORDER — POTASSIUM CHLORIDE CRYS ER 20 MEQ PO TBCR
40.0000 meq | EXTENDED_RELEASE_TABLET | Freq: Once | ORAL | Status: AC
  Administered 2021-04-03: 40 meq via ORAL
  Filled 2021-04-03: qty 2

## 2021-04-03 MED ORDER — LACTULOSE 10 GM/15ML PO SOLN: 30.0000 g | Freq: Two times a day (BID) | ORAL | Status: DC | PRN

## 2021-04-03 MED ORDER — ONDANSETRON HCL 4 MG PO TABS: 4.0000 mg | ORAL_TABLET | Freq: Three times a day (TID) | ORAL | Status: DC | PRN

## 2021-04-03 MED ORDER — VITAMIN D 25 MCG (1000 UNIT) PO TABS
1000.0000 [IU] | ORAL_TABLET | Freq: Every day | ORAL | Status: DC
  Administered 2021-04-03 – 2021-04-04 (×2): 1000 [IU] via ORAL
  Filled 2021-04-03 (×2): qty 1

## 2021-04-03 MED ORDER — ACETAMINOPHEN 500 MG PO TABS: 500.0000 mg | ORAL_TABLET | Freq: Four times a day (QID) | ORAL | Status: DC | PRN

## 2021-04-03 MED ORDER — DEXTROSE-NACL 5-0.45 % IV SOLN: INTRAVENOUS | Status: DC

## 2021-04-03 NOTE — Progress Notes (Signed)
Attempted to call for report from [place], no answer, ED secretary asked to call back.

## 2021-04-03 NOTE — Progress Notes (Addendum)
PROGRESS NOTE    Louis Reid  ZES:[id number] DOB: [birth year] DOA: 04/02/2021 PCP: [name], MD    Brief Narrative:  Louis Reid was admitted to the hospital with the working diagnosis of AKI on CKD stage 32b  87 year old male past medical history for coronary disease, heart failure ejection fraction 20%, paroxysmal atrial fibrillation, chronic kidney disease stage IIIb, BPH, hypertension, dyslipidemia, PE, malnutrition who presented with generalized weakness.  Recent hospitalization 10/4 for fecal impaction and dehydration.  He was discharged to a skilled nursing facility 10/8.  He left skilled resident facility and has been living at home for the last 3 days, at home his health continued to decline rapidly.  On his initial physical examination blood pressure 136/83, heart rate 63, respiratory rate 17, oxygen saturation 99%, he had dry mucous membranes, his lungs were clear to auscultation, heart S1-S2, present, rhythmic, soft abdomen, no lower extremity edema.  Sodium 140, potassium 3.8, chloride 109, bicarb 22, glucose 97, BUN 32, creatinine 2.0, white count 7.2, hemoglobin 13.1, hematocrit 39.3, platelets 150. SARS COVID-19 negative.  Urinalysis specific gravity 1.011, negative nitrates.  EKG 87 bpm, rightward axis, normal intervals, sinus arrhythmia, low voltage, no significant ST segment or T wave changes.  Assessment & Plan:   Principal Problem:   AKI (acute kidney injury) ([place]) Active Problems:   Essential hypertension   Coronary artery disease   Generalized weakness   Paroxysmal atrial fibrillation ([facility])   AKI on CKD stage 3b, hypokalemia, Patient is tolerating IV fluids well, his renal function today has a serum cr of 1,76, K 3,2 and serum bicarbonate at 24. Cl 111  Plan to continue volume repletion with 0,45% saline to prevent hyper natremia and hyper chloremia. Continue to follow up renal function in am, avoid hypotension and nephrotoxic medications.   Consult nutrition, PT and OT.  40 KCl po x1 today.   2. Chronic systolic heart failure, HTN. No signs of heart failure exacerbation. Continue blood pressure monitoring.  Hold on furosemide for now.   3. Paroxysmal atrial fibrillation. Continue rate control.  Anticoagulation with apixaban.   Patient continue to be at high risk for worsening renal failure   4. Anemia of iron deficiency. Hgb stable, will continue with oral iron supplementation.   Status is: Inpatient  Remains inpatient appropriate because: IV fluids    DVT prophylaxis: Enoxaparin   Code Status:    DNR  Family Communication:  No family at the bedside       Subjective: Patient with nausea but not vomiting, feeling better, but not yet back to baseline, continue to be very weak and deconditioned, very poor oral intake.   Objective: Vitals:   04/03/21 1130 04/03/21 1230 04/03/21 1330 04/03/21 1430  BP: 100/68 127/73 127/75 104/66  Pulse: 71 68 64 70  Resp: 16 16 20 15   Temp:      TempSrc:      SpO2: 100% 98% 99% 99%  Weight:      Height:        Intake/Output Summary (Last 24 hours) at 04/03/2021 1502 Last data filed at 04/03/2021 1300 Gross per 24 hour  Intake 1000 ml  Output --  Net 1000 ml   Filed Weights   04/02/21 1855  Weight: 45.4 kg    Examination:   General: Not in pain or dyspnea. Deconditioned and ill looking appearing  Neurology: Awake and alert, non focal  E ENT: no pallor, no icterus, oral mucosa moist Cardiovascular: No JVD. S1-S2 present, rhythmic,  no gallops, rubs, or murmurs. No lower extremity edema. Pulmonary: positive breath sounds bilaterally, adequate air movement, no wheezing, rhonchi or rales. Gastrointestinal. Abdomen soft and non tender Skin. No rashes Musculoskeletal: pectus excavatum     Data Reviewed: I have personally reviewed following labs and imaging studies  CBC: Recent Labs  Lab 04/02/21 1913  WBC 7.2  NEUTROABS 5.2  HGB 13.1  HCT 39.3  MCV 95.2   PLT 638   Basic Metabolic Panel: Recent Labs  Lab 04/02/21 1913 04/03/21 0241  NA 140 140  K 3.8 3.2*  CL 109 111  CO2 22 24  GLUCOSE 97 82  BUN 32* 30*  CREATININE 2.03* 1.76*  CALCIUM 8.9 8.3*   GFR: Estimated Creatinine Clearance: 19.3 mL/min (A) (by C-G formula based on SCr of 1.76 mg/dL (H)). Liver Function Tests: Recent Labs  Lab 04/02/21 1913  AST 35  ALT 26  ALKPHOS 78  BILITOT 0.8  PROT 5.5*  ALBUMIN 3.3*   No results for input(s): LIPASE, AMYLASE in the last 168 hours. No results for input(s): AMMONIA in the last 168 hours. Coagulation Profile: No results for input(s): INR, PROTIME in the last 168 hours. Cardiac Enzymes: No results for input(s): CKTOTAL, CKMB, CKMBINDEX, TROPONINI in the last 168 hours. BNP (last 3 results) No results for input(s): PROBNP in the last 8760 hours. HbA1C: No results for input(s): HGBA1C in the last 72 hours. CBG: No results for input(s): GLUCAP in the last 168 hours. Lipid Profile: No results for input(s): CHOL, HDL, LDLCALC, TRIG, CHOLHDL, LDLDIRECT in the last 72 hours. Thyroid Function Tests: No results for input(s): TSH, T4TOTAL, FREET4, T3FREE, THYROIDAB in the last 72 hours. Anemia Panel: No results for input(s): VITAMINB12, FOLATE, FERRITIN, TIBC, IRON, RETICCTPCT in the last 72 hours.    Radiology Studies: I have reviewed all of the imaging during this hospital visit personally     Scheduled Meds:  Continuous Infusions:    LOS: 0 days        Shelagh Rayman Gerome Apley, MD

## 2021-04-03 NOTE — Evaluation (Signed)
Occupational Therapy Evaluation Patient Details Name: Louis Reid MRN: [id number] DOB: 06-25-1932 Today's Date: 04/03/2021   History of Present Illness 87 y.o. male with medical history significant of CAD, advanced ischemic cardiomyopathy with EF 20% status post AICD, paroxysmal A. fib on Eliquis, CKD stage IIIb, BPH, hypertension, hyperlipidemia, splenic infarct, history of PE, chronic protein calorie malnutrition, chronic constipation, persistent FTT hospitalized 9/13-1/19 and palliative care consulted.  Admitted for generalized weakness and an inability to care for himself at home.   Clinical Impression   Patient admitted for the above diagnosis.  Of note he has had multiple admissions for the above diagnosis.  His biggest complaint is progressing weakness, and difficulty caring for himself at home.  His son lives in [place], but due to ongoing health issues, is only able to help the patient a few days a week.  Deficits are listed below.  OT to follow in the acute care setting, but SNF for short term rehab, and disposition planning for LTC versus home with increased services needs to be discussed.  Currently he is needing close contact guard for ADL and mobility.        Recommendations for follow up therapy are one component of a multi-disciplinary discharge planning process, led by the attending physician.  Recommendations may be updated based on patient status, additional functional criteria and insurance authorization.   Follow Up Recommendations  Skilled nursing-short term rehab (<3 hours/day)    Assistance Recommended at Discharge Frequent or constant Supervision/Assistance  Functional Status Assessment  Patient has had a recent decline in their functional status and demonstrates the ability to make significant improvements in function in a reasonable and predictable amount of time.  Equipment Recommendations  BSC;Tub/shower seat    Recommendations for Other Services PT consult      Precautions / Restrictions Precautions Precautions: Fall Restrictions Weight Bearing Restrictions: No      Mobility Bed Mobility Overal bed mobility: Needs Assistance Bed Mobility: Sidelying to Sit;Sit to Sidelying     Supine to sit: Min guard Sit to supine: Supervision        Transfers Overall transfer level: Needs assistance   Transfers: Sit to/from Stand;Stand Pivot Transfers Sit to Stand: Min guard Stand pivot transfers: Min guard                Balance Overall balance assessment: Needs assistance Sitting-balance support: No upper extremity supported;Feet supported Sitting balance-Leahy Scale: Good     Standing balance support: Bilateral upper extremity supported;Reliant on assistive device for balance Standing balance-Leahy Scale: Fair                             ADL either performed or assessed with clinical judgement   ADL       Grooming: Wash/dry hands;Standing;Supervision/safety       Lower Body Bathing: Min guard;Sit to/from stand       Lower Body Dressing: Min guard;Sit to/from stand   Toilet Transfer: Min guard;Ambulation;Regular Toilet;Rolling walker (2 wheels)   Toileting- Clothing Manipulation and Hygiene: Min guard;Sitting/lateral lean       Functional mobility during ADLs: Min guard;Rolling walker (2 wheels)       Vision Patient Visual Report: No change from baseline       Perception Perception Perception: Not tested   Praxis Praxis Praxis: Not tested    Pertinent Vitals/Pain Pain Assessment: No/denies pain     Hand Dominance Right   Extremity/Trunk Assessment Upper Extremity Assessment  Upper Extremity Assessment: Generalized weakness   Lower Extremity Assessment Lower Extremity Assessment: Defer to PT evaluation   Cervical / Trunk Assessment Cervical / Trunk Assessment: Kyphotic   Communication Communication Communication: No difficulties   Cognition Arousal/Alertness: Awake/alert Behavior  During Therapy: WFL for tasks assessed/performed Overall Cognitive Status: Within Functional Limits for tasks assessed                                                        Home Living Family/patient expects to be discharged to:: Private residence Living Arrangements: Alone Available Help at Discharge: Available PRN/intermittently;Family (son lives in [place], and can stay with him a night or two during the week.)   Home Access: Stairs to enter [employer] of Steps: 5 Entrance Stairs-Rails: Right;Left;Can reach both Home Layout: Able to live on main level with bedroom/bathroom;Two level Alternate Level Stairs-Number of Steps: 14 Alternate Level Stairs-Rails: Right Bathroom Shower/Tub: Teacher, early years/pre: Standard Bathroom Accessibility: Yes How Accessible: Accessible via walker Home Equipment: Rollator (4 wheels);Grab bars - toilet          Prior Functioning/Environment Prior Level of Function : Needs assist       Physical Assist : Mobility (physical);ADLs (physical) Mobility (physical): Gait ADLs (physical): IADLs Mobility Comments: Patient with declining ability to walk household distances. ADLs Comments: Completes sink baths with increased time and effort.  Increasing difficulty with home management and meal prep.  Does not drive.        OT Problem List: Decreased strength;Decreased activity tolerance;Impaired balance (sitting and/or standing);Decreased knowledge of use of DME or AE      OT Treatment/Interventions: Self-care/ADL training;Therapeutic exercise;DME and/or AE instruction;Therapeutic activities;Patient/family education    OT Goals(Current goals can be found in the care plan section) Acute Rehab OT Goals Patient Stated Goal: I need to get stronger, I'm having problems at home OT Goal Formulation: With patient Time For Goal Achievement: 04/17/21 Potential to Achieve Goals: Good  OT Frequency: Min 2X/week    Barriers to D/C: Decreased caregiver support          Co-evaluation              AM-PAC OT "6 Clicks" Daily Activity     Outcome Measure Help from another person eating meals?: None Help from another person taking care of personal grooming?: None Help from another person toileting, which includes using toliet, bedpan, or urinal?: A Little Help from another person bathing (including washing, rinsing, drying)?: A Little Help from another person to put on and taking off regular upper body clothing?: None Help from another person to put on and taking off regular lower body clothing?: A Little 6 Click Score: 21   End of Session Equipment Utilized During Treatment: Gait belt;Rolling walker (2 wheels) Nurse Communication: Mobility status  Activity Tolerance: Patient tolerated treatment well Patient left: in bed;with call bell/phone within reach  OT Visit Diagnosis: Unsteadiness on feet (R26.81);Muscle weakness (generalized) (M62.81)                Time: 0814-4818 OT Time Calculation (min): 35 min Charges:  OT General Charges $OT Visit: 1 Visit OT Evaluation $OT Eval Moderate Complexity: 1 Mod OT Treatments $Self Care/Home Management : 8-22 mins  04/03/2021  RP, OTR/L  Acute Rehabilitation Services  Office:  [phone]   Janice Coffin  Jahmir Salo 04/03/2021, 9:00 AM

## 2021-04-03 NOTE — ED Notes (Signed)
Breakfast orders placed 

## 2021-04-03 NOTE — Progress Notes (Signed)
Manufacturing engineer [facility])     [name] is a active hospice patient with [facility], admitted with a terminal diagnosis of combined CHF.  [name] has been having increased weakness with poor intake at home, where he lives alone.  He transferred for to [facility] ED for assessment and is being admitted with a diagnosis of AKI.  Per Dr. Jewel Baize, [facility] MD, this is a related admission.    Visited at bedside.  Pt had received IV fluids and reported feeling much better. [name] Aox4 at baseline and engaged in discussion of challenges of living alone as well as the difficulty and frustrations of seeking adequate care in a facility.  Pt reports recently leaving [facility] where he felt he was getting poor value for his money. Pt reports having a long term care insurance plan.  Exchanged report with [place] as well as ED TOC [place].    Pt appropriate for inpatient level of care due to the need for IV fluids.  VS: 97.5 oral, 110/82 (91), HR 70, RR 15, 99% RA I/O: not charted Abnormal Labs: K 3.2, BUN 30, Creat 1.76,  GFR 37, Ca 8.3, Alb 3.3, total prot 5.5 Diagnostics: no new scans Active Problem Lists:  AKI on CKD stage 3b, hypokalemia, Patient is tolerating IV fluids well, his renal function today has a serum cr of 1,76, K 3,2 and serum bicarbonate at 24. Cl 111   Plan to continue volume repletion with 0,45% saline to prevent hyper natremia and hyper chloremia. Continue to follow up renal function in am, avoid hypotension and nephrotoxic medications.  Consult nutrition, PT and OT.  40 KCl po x1 today.    2. Chronic systolic heart failure, HTN. No signs of heart failure exacerbation. Continue blood pressure monitoring.  Hold on furosemide for now.    3. Paroxysmal atrial fibrillation. Continue rate control.  Anticoagulation with apixaban.    Patient continue to be at high risk for worsening renal failure    4. Anemia of iron deficiency. Hgb stable, will continue with oral iron  supplementation.    Goals of Care: "treat the treatable" Discharge Planning: unclear, possible placement with continued hospice support Family Update: Declined by Pt, Aox4 IDT: Updated  Thank you for the opportunity to participate in this patient's care.     Domenic Moras, BSN, RN [facility] Liaison [phone] (24h on call)

## 2021-04-03 NOTE — Evaluation (Signed)
Physical Therapy Evaluation Patient Details Name: Louis Reid MRN: [id number] DOB: July 12, 1932 Today's Date: 04/03/2021  History of Present Illness  87 y.o. male admitted10/26 for generalized weakness and an inability to care for himself at home. PMH significant of CAD, advanced ischemic cardiomyopathy with EF 20% status post AICD, paroxysmal A. fib on Eliquis, CKD stage IIIb, BPH, hypertension, splenic infarct, PE, chronic protein calorie malnutrition.  Clinical Impression  Pt admitted secondary to problem above with deficits below. Pt requiring min guard A to stand and take side steps at EOB. Pt reporting increased fatigue and requesting to defer further mobility. Pt reports he lives at home alone and is having increased difficulty caring for himself. Feel a ST SNF stay would be beneficial, but also need to start having long term care discussions as unsure of what follow up pt will qualify for being on hospice services. Will continue to follow acutely and update recommendations based on POC discussion and pt progress.         Recommendations for follow up therapy are one component of a multi-disciplinary discharge planning process, led by the attending physician.  Recommendations may be updated based on patient status, additional functional criteria and insurance authorization.  Follow Up Recommendations Skilled nursing-short term rehab (<3 hours/day)    Assistance Recommended at Discharge Frequent or constant Supervision/Assistance  Functional Status Assessment Patient has had a recent decline in their functional status and demonstrates the ability to make significant improvements in function in a reasonable and predictable amount of time.  Equipment Recommendations  Wheelchair cushion (measurements PT);Wheelchair (measurements PT)    Recommendations for Other Services       Precautions / Restrictions Precautions Precautions: Fall Restrictions Weight Bearing Restrictions: No       Mobility  Bed Mobility Overal bed mobility: Needs Assistance Bed Mobility: Supine to Sit;Sit to Supine     Supine to sit: Min guard Sit to supine: Min guard   General bed mobility comments: Min guard for safety.    Transfers Overall transfer level: Needs assistance Equipment used: 1 person hand held assist Transfers: Sit to/from Stand Sit to Stand: Min guard           General transfer comment: Min guard A to stand and take side steps at EOB. Pt reporting increased fatigue and requesting to defer further mobility.    Ambulation/Gait                Stairs            Wheelchair Mobility    Modified Rankin (Stroke Patients Only)       Balance Overall balance assessment: Needs assistance Sitting-balance support: No upper extremity supported Sitting balance-Leahy Scale: Fair     Standing balance support: Single extremity supported Standing balance-Leahy Scale: Poor Standing balance comment: Reliant on UE support                             Pertinent Vitals/Pain Pain Assessment: No/denies pain    Home Living Family/patient expects to be discharged to:: Private residence Living Arrangements: Alone Available Help at Discharge: Available PRN/intermittently;Family (son lives in [place]) Type of Home: House Home Access: Stairs to enter Entrance Stairs-Rails: Right;Left;Can reach both Technical brewer of Steps: 5 Alternate Level Stairs-Number of Steps: 14 Home Layout: Able to live on main level with bedroom/bathroom;Two level Home Equipment: Rollator (4 wheels);Grab bars - toilet      Prior Function Prior Level of Function :  Needs assist       Physical Assist : Mobility (physical);ADLs (physical) Mobility (physical): Gait ADLs (physical): IADLs Mobility Comments: Patient with declining ability to walk household distances. ADLs Comments: Completes sink baths with increased time and effort.  Increasing difficulty with home  management and meal prep.  Does not drive.     Hand Dominance        Extremity/Trunk Assessment   Upper Extremity Assessment Upper Extremity Assessment: Defer to OT evaluation    Lower Extremity Assessment Lower Extremity Assessment: Generalized weakness    Cervical / Trunk Assessment Cervical / Trunk Assessment: Kyphotic  Communication   Communication: No difficulties  Cognition Arousal/Alertness: Awake/alert Behavior During Therapy: WFL for tasks assessed/performed Overall Cognitive Status: No family/caregiver present to determine baseline cognitive functioning                                 General Comments: Pt with self reported memory deficits. Tended to repeat himself        General Comments      Exercises     Assessment/Plan    PT Assessment Patient needs continued PT services  PT Problem List Decreased strength;Decreased activity tolerance;Decreased range of motion;Decreased balance;Decreased mobility;Decreased knowledge of use of DME;Decreased cognition;Decreased safety awareness;Decreased knowledge of precautions;Cardiopulmonary status limiting activity       PT Treatment Interventions Gait training;Stair training;DME instruction;Functional mobility training;Therapeutic activities;Therapeutic exercise;Balance training;Neuromuscular re-education;Patient/family education    PT Goals (Current goals can be found in the Care Plan section)  Acute Rehab PT Goals Patient Stated Goal: To get more help after d/c PT Goal Formulation: With patient Time For Goal Achievement: 04/17/21 Potential to Achieve Goals: Fair    Frequency Min 2X/week   Barriers to discharge Decreased caregiver support      Co-evaluation               AM-PAC PT "6 Clicks" Mobility  Outcome Measure Help needed turning from your back to your side while in a flat bed without using bedrails?: A Little Help needed moving from lying on your back to sitting on the side  of a flat bed without using bedrails?: A Little Help needed moving to and from a bed to a chair (including a wheelchair)?: A Little Help needed standing up from a chair using your arms (e.g., wheelchair or bedside chair)?: A Little Help needed to walk in hospital room?: A Little Help needed climbing 3-5 steps with a railing? : A Lot 6 Click Score: 17    End of Session Equipment Utilized During Treatment: Gait belt Activity Tolerance: Patient tolerated treatment well Patient left: in bed;with call bell/phone within reach (on stretcher in ED) Nurse Communication: Mobility status PT Visit Diagnosis: Unsteadiness on feet (R26.81);Muscle weakness (generalized) (M62.81)    Time: 5027-7412 PT Time Calculation (min) (ACUTE ONLY): 13 min   Charges:   PT Evaluation $PT Eval Moderate Complexity: 1 Mod          Reuel Derby, PT, DPT  Acute Rehabilitation Services  Pager: [phone] Office: [phone]   [name] 04/03/2021, 3:26 PM

## 2021-04-04 DIAGNOSIS — N179 Acute kidney failure, unspecified: Secondary | ICD-10-CM | POA: Diagnosis not present

## 2021-04-04 DIAGNOSIS — L8990 Pressure ulcer of unspecified site, unspecified stage: Secondary | ICD-10-CM | POA: Insufficient documentation

## 2021-04-04 LAB — BASIC METABOLIC PANEL
Anion gap: 6 (ref 5–15)
BUN: 25 mg/dL — ABNORMAL HIGH (ref 8–23)
CO2: 25 mmol/L (ref 22–32)
Calcium: 8.4 mg/dL — ABNORMAL LOW (ref 8.9–10.3)
Chloride: 106 mmol/L (ref 98–111)
Creatinine, Ser: 1.28 mg/dL — ABNORMAL HIGH (ref 0.61–1.24)
GFR, Estimated: 55 mL/min — ABNORMAL LOW (ref >60)
Glucose, Bld: 106 mg/dL — ABNORMAL HIGH (ref 70–99)
Potassium: 3.9 mmol/L (ref 3.5–5.1)
Sodium: 137 mmol/L (ref 135–145)

## 2021-04-04 LAB — MAGNESIUM: Magnesium: 1.8 mg/dL (ref 1.7–2.4)

## 2021-04-04 MED ORDER — ADULT MULTIVITAMIN W/MINERALS CH
1.0000 | ORAL_TABLET | Freq: Every day | ORAL | Status: DC
  Administered 2021-04-04: 1 via ORAL
  Filled 2021-04-04: qty 1

## 2021-04-04 NOTE — Plan of Care (Signed)
°  Problem: Education: °Goal: Knowledge of General Education information will improve °Description: Including pain rating scale, medication(s)/side effects and non-pharmacologic comfort measures °Outcome: Progressing °  °Problem: Nutrition: °Goal: Adequate nutrition will be maintained °Outcome: Progressing °  °Problem: Skin Integrity: °Goal: Risk for impaired skin integrity will decrease °Outcome: Progressing °  °

## 2021-04-04 NOTE — Discharge Instructions (Signed)

## 2021-04-04 NOTE — Progress Notes (Signed)
Initial Nutrition Assessment  DOCUMENTATION CODES:   Underweight  INTERVENTION:   -Continue Ensure Enlive po TID, each supplement provides 350 kcal and 20 grams of protein  -MVI with minerals daily -Magic cup TID with meals, each supplement provides 290 kcal and 9 grams of protein   NUTRITION DIAGNOSIS:   Increased nutrient needs related to chronic illness (CHF) as evidenced by estimated needs.  GOAL:   Patient will meet greater than or equal to 90% of their needs  MONITOR:   PO intake, Supplement acceptance, Labs, Weight trends, Skin, I & O's  REASON FOR ASSESSMENT:   Consult, Malnutrition Screening Tool Assessment of nutrition requirement/status  ASSESSMENT:   Louis Reid is a 87 y.o. male with medical history significant of CAD, advanced ischemic cardiomyopathy with EF 20% status post AICD, paroxysmal A. fib on Eliquis, CKD stage IIIb, BPH, hypertension, hyperlipidemia, splenic infarct, history of PE, chronic protein calorie malnutrition, chronic constipation, persistent FTT hospitalized 9/13-1/19 and palliative care consulted after which she was discharged home with hospice.  Presented again to the hospital on 10/4 as he did not feel safe living alone at home.  Pt admitted with AKI on CKD stage 3b.  Reviewed I/O's: +2.1 L x 24 hours  UOP: 0 ml x 24 hours  Pt unavailable at time of visit. Attempted to speak with pt via call to hospital room phone, however, unable to reach. RD unable to obtain further nutrition-related history or complete nutrition-focused physical exam at this time.    Pt familiar to this RD from multiple prior admissions. Per chart review, pt is followed by hospice at home and has had poor oral intake PTA. He has also reports loss of taste in the past. Noted meal completion 0%. Pt accepts Ensure supplements well.  Reviewed wt hs; pt has experienced a 20.6% wt loss over the past 2 months, which is significant for time frame.   RD unable to identify  malnutrition at this time, however, suspect ongoing diagnosis of severe malnutrition, given prior diagnosis, history of poor oral intake, and significant recent wt loss.   Medications reviewed and include vitamin D3, colace, ferrous sulfate, vitamin B-12, and dextrose 5%-0-45% sodium chloride infusion @ 75 ml/hr.   Lab Results  Component Value Date   HGBA1C 5.9 (H) 05/18/2012   PTA DM medications are none.   Labs reviewed: K: 3.2, CBS: 147 (inpatient orders for glycemic control are none).    Diet Order:   Diet Order             Diet regular Room service appropriate? Yes; Fluid consistency: Thin  Diet effective now                   EDUCATION NEEDS:   No education needs have been identified at this time  Skin:  Skin Assessment: Skin Integrity Issues: Skin Integrity Issues:: Stage I Stage I: coccyx  Last BM:  Unknown  Height:   Ht Readings from Last 1 Encounters:  04/02/21 5\' 11"  (1.803 m)    Weight:   Wt Readings from Last 1 Encounters:  04/02/21 45.4 kg    Ideal Body Weight:  78.2 kg  BMI:  Body mass index is 13.95 kg/m.  Estimated Nutritional Needs:   Kcal:  1600-1800  Protein:  75-90 grams  Fluid:  > 1.6 L    Loistine Chance, RD, LDN, [place] Registered Dietitian II Certified Diabetes Care and Education Specialist Please refer to [facility] for RD and/or RD on-call/weekend/after hours pager

## 2021-04-04 NOTE — Progress Notes (Signed)
New Admission Note:    Arrival Method: Stretcher Mental Orientation: A&O X4 Telemetry: No Assessment: See flowsheet IV:   20 G Right forearm infusing Pain: Denies Tubes: N/A Safety Measures: Safety Fall Prevention Plan has been given Admission: Completed [facility] Orientation: Patient has been orientated to the room, unit and staff.  Family: none at bedside   Orders have been reviewed and implemented. Will continue to monitor the patient. Call light has been placed within reach and bed alarm has been activated.

## 2021-04-04 NOTE — TOC Transition Note (Signed)
Transition of Care [facility]) - CM/SW Discharge Note   Patient Details  Name: Louis Reid MRN: [id number] Date of Birth: Oct 19, 1932  [facility] CM/SW Contact:  Tom-Johnson, Renea Ee, RN Phone Number: 04/04/2021, 12:19 PM   Clinical Narrative:    CM spoke with patient at bedside about needs for post hospital transition. Patient is scheduled for discharge today. Patient states he lives alone at home. Has one son who is at bedside. Independent with care prior to hospitalization. States he does the best he can and his son visits him often. Son drives to and from appointments and does his errands. States when his son is not available, he has friends and other relatives that will assist. All his siblings are deceased. 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 Has a walker and cane at home. Gets shortness of breath upon exertion but not on oxygen at home. PCP is Claris Gower, MD and uses [facility] on [address]. Also uses [facility] for medication delivery. Active with [facility] in [place] for hospice services. Has [facility] Medicare. PT/OT recommended Home health and patient declined stating he is too frail and cannot walk to his mailbox without passing out and therefore cannot tolerate therapy. MD and nurse made aware. Son at bedside and will transport at discharge. No further TOC needs at this time.   Final next level of care: Home/Self Care Barriers to Discharge: No Barriers Identified   Patient Goals and CMS Choice Patient states their goals for this hospitalization and ongoing recovery are:: To go home CMS Medicare.gov Compare Post Acute Care list provided to:: Patient Choice offered to / list presented to : Patient  Discharge Placement                       Discharge Plan and Services                DME Arranged: Patient refused services, N/A DME Agency: NA       HH Arranged: NA HH Agency: NA        Social Determinants of Health (SDOH)  Interventions     Readmission Risk Interventions No flowsheet data found.

## 2021-04-04 NOTE — Progress Notes (Signed)
DISCHARGE NOTE HOME Louis Reid to be discharged Home per MD order. Discussed prescriptions and follow up appointments with the patient. Prescriptions given to patient; medication list explained in detail. Patient verbalized understanding.  Skin clean, dry and intact without evidence of skin break down, no evidence of skin tears noted. IV catheter discontinued intact. Site without signs and symptoms of complications. Dressing and pressure applied. Pt denies pain at the site currently. No complaints noted.  Patient free of lines, drains, and wounds.   An After Visit Summary (AVS) was printed and given to the patient. Patient escorted via wheelchair, and discharged home via private auto.  Vira Agar, RN

## 2021-04-04 NOTE — Discharge Summary (Signed)
Physician Discharge Summary  BETH SPACKMAN HGD:[id number] DOB: 1933-01-19 DOA: 04/02/2021  PCP: [name], MD  Admit date: 04/02/2021 Discharge date: 04/04/2021  Admitted From: Home Disposition: Home with home health  Recommendations for Outpatient Follow-up:  Follow up with PCP in 1-2 weeks Please obtain BMP/CBC in one week your next doctors visit.  Follow-up outpatient podiatry for ingrown toenails  Home Health: PT Equipment/Devices:None Discharge Condition: Stable CODE STATUS: DNR Diet recommendation: Cardiac  Brief/Interim Summary: 87 year old male past medical history for coronary disease, heart failure ejection fraction 20%, paroxysmal atrial fibrillation, chronic kidney disease stage IIIb, BPH, hypertension, dyslipidemia, PE, malnutrition who presented with generalized weakness.  Recent hospitalization 10/4 for fecal impaction and dehydration.  He was discharged to a skilled nursing facility 10/8.  He left skilled resident facility and has been living at home for the last 3 days, at home his health continued to decline rapidly.  On his initial physical examination blood pressure 136/83, heart rate 63, respiratory rate 17, oxygen saturation 99%, he had dry mucous membranes, his lungs were clear to auscultation, heart S1-S2, present, rhythmic, soft abdomen, no lower extremity edema.  He was found to have acute kidney injury therefore admitted to the hospital along with weakness.  During hospitalization he received IV fluids which improved his renal function and returned back to baseline of 1.2.  Patient was seen by PT but he had was adamant that he wanted to go home.  He also wanted podiatry referral for his ingrown toenails. I called his son on the day of discharge but no response therefore left a voicemail.  Patient is medically stable for discharge today.  Acute kidney injury on CKD stage IIIb - Admission creatinine 2.0, baseline creatinine 1.2.  Today after IV fluids  his creatinine is 1.28 at baseline.  Tolerating orals.  He is stable for discharge.  And will need outpatient follow-up with PCP  Generalized weakness - PT/OT.  Patient is adamant about going home therefore will arrange for home health.  Chronic systolic CHF with EF 96% - Currently appears to be compensated.  Resume home medications.  Paroxysmal A. fib - On Eliquis  Anemia of chronic disease and iron deficiency - Resume home meds.  Iron supplements and bowel regimen.  Body mass index is 13.95 kg/m.  Pressure Injury 04/03/21 Coccyx Stage 1 -  Intact skin with non-blanchable redness of a localized area usually over a bony prominence. (Active)  04/03/21 2126  Location: Coccyx  Location Orientation:   Staging: Stage 1 -  Intact skin with non-blanchable redness of a localized area usually over a bony prominence.  Wound Description (Comments):   Present on Admission: Yes        Discharge Diagnoses:  Principal Problem:   AKI (acute kidney injury) ([place]) Active Problems:   Essential hypertension   Coronary artery disease   Generalized weakness   Paroxysmal atrial fibrillation ([facility])   Pressure injury of skin      Subjective: Feeling great no complaints.  He is requesting toenail clipping for his ingrown toenails but have advised him that this will have to be done outpatient at the podiatrist office.  Discharge Exam: Vitals:   04/04/21 0859 04/04/21 1018  BP: 110/69 103/73  Pulse: 76 72  Resp: 20 18  Temp: 97.6 F (36.4 C) 98.2 F (36.8 C)  SpO2: 98% 100%   Vitals:   04/04/21 0100 04/04/21 0518 04/04/21 0859 04/04/21 1018  BP: (!) 96/58 96/64 110/69 103/73  Pulse: 65 60 76  72  Resp: 18 18 20 18   Temp: 97.7 F (36.5 C) 98 F (36.7 C) 97.6 F (36.4 C) 98.2 F (36.8 C)  TempSrc: Oral  Oral Oral  SpO2: 99% 99% 98% 100%  Weight:      Height:        General: Pt is alert, awake, not in acute distress Cardiovascular: RRR, S1/S2 +, no rubs, no  gallops Respiratory: CTA bilaterally, no wheezing, no rhonchi Abdominal: Soft, NT, ND, bowel sounds + Extremities: no edema, no cyanosis  Discharge Instructions  Discharge Instructions     Ambulatory referral to Podiatry   Complete by: As directed    Toe nail      Allergies as of 04/04/2021   No Known Allergies      Medication List     TAKE these medications    acetaminophen 500 MG tablet Commonly known as: TYLENOL Take 1 tablet (500 mg total) by mouth every 6 (six) hours as needed for mild pain (or Fever >/= 101).   apixaban 2.5 MG Tabs tablet Commonly known as: Eliquis Take 1 tablet (2.5 mg total) by mouth 2 (two) times daily.   bisacodyl 10 MG suppository Commonly known as: DULCOLAX Place 1 suppository (10 mg total) rectally daily as needed for moderate constipation.   D3-1000 25 MCG (1000 UT) capsule Generic drug: Cholecalciferol Take 1,000 Units by mouth daily.   docusate sodium 100 MG capsule Commonly known as: COLACE Take 2 capsules (200 mg total) by mouth at bedtime.   feeding supplement Liqd Take 237 mLs by mouth 3 (three) times daily between meals. What changed: when to take this   ferrous sulfate 325 (65 FE) MG tablet Take 325 mg by mouth daily with breakfast.   finasteride 5 MG tablet Commonly known as: PROSCAR Take 5 mg by mouth daily.   furosemide 20 MG tablet Commonly known as: LASIX Take 20 mg by mouth daily.   ICAPS AREDS 2 PO Take 1 capsule by mouth 2 (two) times daily.   lactulose 10 GM/15ML solution Commonly known as: CHRONULAC Take 45 mLs (30 g total) by mouth 2 (two) times daily as needed for moderate constipation.   ondansetron 4 MG tablet Commonly known as: Zofran Take 1 tablet (4 mg total) by mouth every 8 (eight) hours as needed for up to 10 doses for nausea or vomiting.   polyethylene glycol 17 g packet Commonly known as: MIRALAX / GLYCOLAX Take 17 g by mouth 2 (two) times daily.   senna-docusate 8.6-50 MG  tablet Commonly known as: Senokot-S Take 1 tablet by mouth 2 (two) times daily. What changed:  how much to take when to take this reasons to take this   vitamin B-12 1000 MCG tablet Commonly known as: CYANOCOBALAMIN Take 1,000 mcg by mouth daily.        Follow-up Information     [name], MD Follow up in 1 week(s).   Specialty: Family Medicine Contact information: [place] [address] [phone]         Lelon Perla, MD .   Specialty: Cardiology Contact information: [address] [place] [address] [phone]                No Known Allergies  You were cared for by a hospitalist during your hospital stay. If you have any questions about your discharge medications or the care you received while you were in the hospital after you are discharged, you can call  the unit and asked to speak with the hospitalist on call if the hospitalist that took care of you is not available. Once you are discharged, your primary care physician will handle any further medical issues. Please note that no refills for any discharge medications will be authorized once you are discharged, as it is imperative that you return to your primary care physician (or establish a relationship with a primary care physician if you do not have one) for your aftercare needs so that they can reassess your need for medications and monitor your lab values.   Procedures/Studies: CT ABDOMEN PELVIS W CONTRAST  Result Date: 03/11/2021 CLINICAL DATA:  Abdominal pain, acute, nonlocalized. constipation EXAM: CT ABDOMEN AND PELVIS WITH CONTRAST TECHNIQUE: Multidetector CT imaging of the abdomen and pelvis was performed using the standard protocol following bolus administration of intravenous contrast. CONTRAST:  35mL OMNIPAQUE IOHEXOL 350 MG/ML SOLN COMPARISON:  CT abdomen pelvis 06/08/2019. FINDINGS: Lower chest: No acute abnormality. Hepatobiliary: The  hepatic parenchyma is diffusely hypodense compared to the splenic parenchyma consistent with fatty infiltration. Heterogeneity of the left hepatic lobe with at least a couple of hyperdense lesions measuring 2.2 and 0.9 cm (2:25). No gallstones, gallbladder wall thickening, or pericholecystic fluid. No biliary dilatation. Pancreas: No focal lesion. Normal pancreatic contour. No surrounding inflammatory changes. No main pancreatic ductal dilatation. Spleen: Couple of wedge-shaped hypodense peripheral densities within the splenic parenchyma suggestive of splenic infarctions. A splenule is noted. Adrenals/Urinary Tract: No adrenal nodule bilaterally. Bilateral kidneys enhance symmetrically. Bilateral renal cortical scarring. Mild prominence of the left collecting system. No right hydronephrosis. No hydroureter. The urinary bladder is unremarkable. Stomach/Bowel: Stomach is within normal limits. No evidence of bowel wall thickening or dilatation. Rectal stool ball measuring up to 8 cm. Remainder of the colon is under distended and difficult to evaluate. Appendix appears normal. Vascular/Lymphatic: The main portal, splenic, superior mesenteric veins are patent. No abdominal aorta or iliac aneurysm. Severe atherosclerotic plaque of the aorta and its branches. No abdominal, pelvic, or inguinal lymphadenopathy. Reproductive: Prominent prostate measuring 4.5 cm. Other: Trace volume free fluid within the pelvis. No intraperitoneal free gas. No organized fluid collection. Musculoskeletal: No abdominal wall hernia or abnormality. No suspicious lytic or blastic osseous lesions. No acute displaced fracture. Dextroscoliosis centered at the L1-L2 level. IMPRESSION: 1. Marked hepatic steatosis with heterogeneity of the left hepatic lobe with at least a couple of hepatic lesions measuring 2.2 and 0.9 cm. Recommend nonemergent MRI liver protocol for further evaluation. When the patient is clinically stable and able to follow directions  and hold their breath (preferably as an outpatient) further evaluation with dedicated abdominal MRI should be considered. 2. Suggestion of splenic infarction. 3. An 8 cm rectal stool ball with no definite findings of stercoral colitis. Remainder of the colon is under distended and difficult to evaluate. 4. Mild prominence of the left collecting system of unclear etiology. 5. Prominent prostate. 6.  Aortic Atherosclerosis (ICD10-I70.0). Electronically Signed   By: Iven Finn M.D.   On: 03/11/2021 16:32     The results of significant diagnostics from this hospitalization (including imaging, microbiology, ancillary and laboratory) are listed below for reference.     Microbiology: Recent Results (from the past 240 hour(s))  Resp Panel by RT-PCR (Flu A&B, Covid) Nasopharyngeal Swab     Status: None   Collection Time: 04/02/21  9:54 PM   Specimen: Nasopharyngeal Swab; Nasopharyngeal(NP) swabs in vial transport medium  Result Value Ref Range Status   SARS Coronavirus 2 by RT  PCR NEGATIVE NEGATIVE Final    Comment: (NOTE) SARS-CoV-2 target nucleic acids are NOT DETECTED.  The SARS-CoV-2 RNA is generally detectable in upper respiratory specimens during the acute phase of infection. The lowest concentration of SARS-CoV-2 viral copies this assay can detect is 138 copies/mL. A negative result does not preclude SARS-Cov-2 infection and should not be used as the sole basis for treatment or other patient management decisions. A negative result may occur with  improper specimen collection/handling, submission of specimen other than nasopharyngeal swab, presence of viral mutation(s) within the areas targeted by this assay, and inadequate number of viral copies(<138 copies/mL). A negative result must be combined with clinical observations, patient history, and epidemiological information. The expected result is Negative.  Fact Sheet for Patients:  EntrepreneurPulse.com.au  Fact  Sheet for Healthcare Providers:  IncredibleEmployment.be  This test is no t yet approved or cleared by the [place] FDA and  has been authorized for detection and/or diagnosis of SARS-CoV-2 by FDA under an Emergency Use Authorization (EUA). This EUA will remain  in effect (meaning this test can be used) for the duration of the COVID-19 declaration under Section 564(b)(1) of the Act, 21 U.S.C.section 360bbb-3(b)(1), unless the authorization is terminated  or revoked sooner.       Influenza A by PCR NEGATIVE NEGATIVE Final   Influenza B by PCR NEGATIVE NEGATIVE Final    Comment: (NOTE) The Xpert Xpress SARS-CoV-2/FLU/RSV plus assay is intended as an aid in the diagnosis of influenza from Nasopharyngeal swab specimens and should not be used as a sole basis for treatment. Nasal washings and aspirates are unacceptable for Xpert Xpress SARS-CoV-2/FLU/RSV testing.  Fact Sheet for Patients: EntrepreneurPulse.com.au  Fact Sheet for Healthcare Providers: IncredibleEmployment.be  This test is not yet approved or cleared by the [place] FDA and has been authorized for detection and/or diagnosis of SARS-CoV-2 by FDA under an Emergency Use Authorization (EUA). This EUA will remain in effect (meaning this test can be used) for the duration of the COVID-19 declaration under Section 564(b)(1) of the Act, 21 U.S.C. section 360bbb-3(b)(1), unless the authorization is terminated or revoked.  Performed at [facility], [place] [address] [place], [place] [address]      Labs: BNP (last 3 results) No results for input(s): BNP in the last 8760 hours. Basic Metabolic Panel: Recent Labs  Lab 04/02/21 1913 04/03/21 0241 04/04/21 0954  NA 140 140 137  K 3.8 3.2* 3.9  CL 109 111 106  CO2 22 24 25   GLUCOSE 97 82 106*  BUN 32* 30* 25*  CREATININE 2.03* 1.76* 1.28*  CALCIUM 8.9 8.3* 8.4*  MG  --   --  1.8   Liver Function  Tests: Recent Labs  Lab 04/02/21 1913  AST 35  ALT 26  ALKPHOS 78  BILITOT 0.8  PROT 5.5*  ALBUMIN 3.3*   No results for input(s): LIPASE, AMYLASE in the last 168 hours. No results for input(s): AMMONIA in the last 168 hours. CBC: Recent Labs  Lab 04/02/21 1913  WBC 7.2  NEUTROABS 5.2  HGB 13.1  HCT 39.3  MCV 95.2  PLT 150   Cardiac Enzymes: No results for input(s): CKTOTAL, CKMB, CKMBINDEX, TROPONINI in the last 168 hours. BNP: Invalid input(s): POCBNP CBG: Recent Labs  Lab 04/03/21 2030  GLUCAP 147*   D-Dimer No results for input(s): DDIMER in the last 72 hours. Hgb A1c No results for input(s): HGBA1C in the last 72 hours. Lipid Profile No results for input(s): CHOL,  HDL, LDLCALC, TRIG, CHOLHDL, LDLDIRECT in the last 72 hours. Thyroid function studies No results for input(s): TSH, T4TOTAL, T3FREE, THYROIDAB in the last 72 hours.  Invalid input(s): FREET3 Anemia work up No results for input(s): VITAMINB12, FOLATE, FERRITIN, TIBC, IRON, RETICCTPCT in the last 72 hours. Urinalysis    Component Value Date/Time   COLORURINE YELLOW 04/03/2021 0606   APPEARANCEUR HAZY (A) 04/03/2021 0606   LABSPEC 1.011 04/03/2021 0606   PHURINE 5.0 04/03/2021 0606   GLUCOSEU NEGATIVE 04/03/2021 0606   HGBUR NEGATIVE 04/03/2021 0606   BILIRUBINUR NEGATIVE 04/03/2021 0606   KETONESUR NEGATIVE 04/03/2021 0606   PROTEINUR NEGATIVE 04/03/2021 0606   UROBILINOGEN 1.0 05/24/2012 1330   NITRITE NEGATIVE 04/03/2021 0606   LEUKOCYTESUR NEGATIVE 04/03/2021 0606   Sepsis Labs Invalid input(s): PROCALCITONIN,  WBC,  LACTICIDVEN Microbiology Recent Results (from the past 240 hour(s))  Resp Panel by RT-PCR (Flu A&B, Covid) Nasopharyngeal Swab     Status: None   Collection Time: 04/02/21  9:54 PM   Specimen: Nasopharyngeal Swab; Nasopharyngeal(NP) swabs in vial transport medium  Result Value Ref Range Status   SARS Coronavirus 2 by RT PCR NEGATIVE NEGATIVE Final    Comment:  (NOTE) SARS-CoV-2 target nucleic acids are NOT DETECTED.  The SARS-CoV-2 RNA is generally detectable in upper respiratory specimens during the acute phase of infection. The lowest concentration of SARS-CoV-2 viral copies this assay can detect is 138 copies/mL. A negative result does not preclude SARS-Cov-2 infection and should not be used as the sole basis for treatment or other patient management decisions. A negative result may occur with  improper specimen collection/handling, submission of specimen other than nasopharyngeal swab, presence of viral mutation(s) within the areas targeted by this assay, and inadequate number of viral copies(<138 copies/mL). A negative result must be combined with clinical observations, patient history, and epidemiological information. The expected result is Negative.  Fact Sheet for Patients:  EntrepreneurPulse.com.au  Fact Sheet for Healthcare Providers:  IncredibleEmployment.be  This test is no t yet approved or cleared by the [place] FDA and  has been authorized for detection and/or diagnosis of SARS-CoV-2 by FDA under an Emergency Use Authorization (EUA). This EUA will remain  in effect (meaning this test can be used) for the duration of the COVID-19 declaration under Section 564(b)(1) of the Act, 21 U.S.C.section 360bbb-3(b)(1), unless the authorization is terminated  or revoked sooner.       Influenza A by PCR NEGATIVE NEGATIVE Final   Influenza B by PCR NEGATIVE NEGATIVE Final    Comment: (NOTE) The Xpert Xpress SARS-CoV-2/FLU/RSV plus assay is intended as an aid in the diagnosis of influenza from Nasopharyngeal swab specimens and should not be used as a sole basis for treatment. Nasal washings and aspirates are unacceptable for Xpert Xpress SARS-CoV-2/FLU/RSV testing.  Fact Sheet for Patients: EntrepreneurPulse.com.au  Fact Sheet for Healthcare  Providers: IncredibleEmployment.be  This test is not yet approved or cleared by the [place] FDA and has been authorized for detection and/or diagnosis of SARS-CoV-2 by FDA under an Emergency Use Authorization (EUA). This EUA will remain in effect (meaning this test can be used) for the duration of the COVID-19 declaration under Section 564(b)(1) of the Act, 21 U.S.C. section 360bbb-3(b)(1), unless the authorization is terminated or revoked.  Performed at [facility], [place] [address], [place], [place] [address]      Time coordinating discharge:  I have spent 35 minutes face to face with the patient and on the ward discussing the patients  care, assessment, plan and disposition with other care givers. >50% of the time was devoted counseling the patient about the risks and benefits of treatment/Discharge disposition and coordinating care.   SIGNED:   Damita Lack, MD  [facility] 04/04/2021, 11:53 AM   If 7PM-7AM, please contact night-coverage

## 2021-04-11 ENCOUNTER — Encounter (HOSPITAL_COMMUNITY): Payer: Self-pay | Admitting: Radiology

## 2021-04-12 ENCOUNTER — Other Ambulatory Visit: Payer: Self-pay | Admitting: Cardiology

## 2021-04-12 DIAGNOSIS — E785 Hyperlipidemia, unspecified: Secondary | ICD-10-CM

## 2021-04-18 ENCOUNTER — Other Ambulatory Visit: Payer: Self-pay | Admitting: Cardiology

## 2021-04-21 ENCOUNTER — Telehealth: Payer: Self-pay | Admitting: *Deleted

## 2021-04-21 NOTE — Telephone Encounter (Signed)
Provider portion of the patient assistance form placed into the mail to the patient at his request.

## 2021-04-24 ENCOUNTER — Ambulatory Visit: Payer: Medicare Other | Admitting: Cardiology

## 2021-04-25 ENCOUNTER — Encounter: Payer: Medicare Other | Admitting: Internal Medicine

## 2021-05-12 ENCOUNTER — Ambulatory Visit (INDEPENDENT_AMBULATORY_CARE_PROVIDER_SITE_OTHER): Admitting: Internal Medicine

## 2021-05-12 ENCOUNTER — Other Ambulatory Visit: Payer: Self-pay

## 2021-05-12 VITALS — BP 132/84 | HR 78 | Ht 71.0 in | Wt 130.4 lb

## 2021-05-12 DIAGNOSIS — I5022 Chronic systolic (congestive) heart failure: Secondary | ICD-10-CM | POA: Diagnosis not present

## 2021-05-12 DIAGNOSIS — I10 Essential (primary) hypertension: Secondary | ICD-10-CM

## 2021-05-12 DIAGNOSIS — I480 Paroxysmal atrial fibrillation: Secondary | ICD-10-CM

## 2021-05-12 DIAGNOSIS — I4901 Ventricular fibrillation: Secondary | ICD-10-CM

## 2021-05-12 DIAGNOSIS — I255 Ischemic cardiomyopathy: Secondary | ICD-10-CM

## 2021-05-12 DIAGNOSIS — E785 Hyperlipidemia, unspecified: Secondary | ICD-10-CM

## 2021-05-12 NOTE — Progress Notes (Signed)
PCP: [name], MD Primary Cardiologist: Dr Stanford Breed CHF:  Bensim[place] Primary EP: Dr Tama High is a 87 y.o. male who presents today for routine electrophysiology followup.  Since last being seen in our clinic, the patient reports doing very well.  Today, he denies symptoms of palpitations, chest pain, shortness of breath,  lower extremity edema, dizziness, presyncope, syncope, or ICD shocks.  The patient is otherwise without complaint today.   Past Medical History:  Diagnosis Date   AICD (automatic cardioverter/defibrillator) present 09/13/2012   Anemia    CAD (coronary artery disease)    a. s/p remote CABG, b. AL STEMI c/b VF arrest in 05/2012 => LHC 05/10/12: Severe 3v CAD, S-OM1/2 ok, SVG-Dx ok, SVG-RCA occluded, LIMA-LAD atretic, proximal LAD occluded. EF 20% with anterior apical HK=> salvage PCI with POBA and thrombectomy of the occluded LAD;  c. 01/2017: atretic LIMA_LAD and known occluded SVG-RCA. Patent LAD stent and 70% proxRCA stenosis (FFR 0.88).    Chronic systolic heart failure ([place])    a. Echo 12/13: EF 20-25%, anteroseptal, inferior, apical HK, mildly reduced RV function, trivial pericardial effusion;   b.  Echo 3/14:  Ant, septal, apical AK, EF 25%   Clotting disorder ([place])    Depression    "years ago" (02/28/2018)   History of kidney stones    HLD (hyperlipidemia)    Hypertension    "I've never had high blood pressure; on RX for other reasons" (02/28/2018)   Inguinal hernia    "have one now on my left side" (09/13/2012); (02/28/2018)   Ischemic cardiomyopathy    Myocardial infarction [facility]) 1996;  05/2012   Osteoporosis    Paroxysmal atrial fibrillation ([place]) 03/17/2016   noted on device interrogation,  will follow burden   Pectus excavatum    Pulmonary embolism ([place])    a. after adhesiolysis for SBO in 04/2012 => coumadin for 6 mos   Renal cyst    SBO (small bowel obstruction) [facility]) November 2013   s/p lysis of adhesions   Sinoatrial node  dysfunction ([place])    Skin cancer    "left ear; face" (02/28/2018)   Ventricular fibrillation ([place]) 05/2012   . in setting of AL STEMI 12/13 => EF 20-25% => d/c on LifeVest (repeat echo planned in 08/2012)   Past Surgical History:  Procedure Laterality Date   CATARACT EXTRACTION W/ INTRAOCULAR LENS  IMPLANT, BILATERAL Bilateral    COLONOSCOPY     CORONARY ANGIOPLASTY WITH STENT PLACEMENT     "I've got 2 or 3 stents" (02/28/2018)   [place]   "CABG X5" (09/13/2012)   CYSTOSCOPY/RETROGRADE/URETEROSCOPY/STONE EXTRACTION WITH BASKET  1990's   ESOPHAGOGASTRODUODENOSCOPY  05/08/2012   Procedure: ESOPHAGOGASTRODUODENOSCOPY (EGD);  Surgeon: Juanita Craver, MD;  Location: [facility] ENDOSCOPY;  Service: Endoscopy;  Laterality: N/A;  Nevin Bloodgood put on for Dr. Collene Mares , Dr. Collene Mares will call if she wants another time   IMPLANTABLE CARDIOVERTER DEFIBRILLATOR IMPLANT N/A 09/13/2012   SJM Ellipse ER implanted by Dr Rayann Heman for secondary prevention   INGUINAL HERNIA REPAIR Right 1996   INTRAVASCULAR PRESSURE WIRE/FFR STUDY N/A 01/20/2017   Procedure: INTRAVASCULAR PRESSURE WIRE/FFR STUDY;  Surgeon: Wellington Hampshire, MD;  Location: [place] CV LAB;  Service: Cardiovascular;  Laterality: N/A;   LAPAROTOMY  04/27/2012   Procedure: EXPLORATORY LAPAROTOMY;  Surgeon: Gwenyth Ober, MD;  Location: [place];  Service: General;  Laterality: N/A;   LEFT HEART CATH AND CORS/GRAFTS ANGIOGRAPHY N/A 01/20/2017   Procedure:  LEFT HEART CATH AND CORS/GRAFTS ANGIOGRAPHY;  Surgeon: Wellington Hampshire, MD;  Location: [place] CV LAB;  Service: Cardiovascular;  Laterality: N/A;   LEFT HEART CATHETERIZATION WITH CORONARY ANGIOGRAM N/A 05/10/2012   Procedure: LEFT HEART CATHETERIZATION WITH CORONARY ANGIOGRAM;  Surgeon: Burnell Blanks, MD;  Location: [facility] CATH LAB;  Service: Cardiovascular;  Laterality: N/A;   PTCA     percutaneous transluminal coronary intervention and brachy therapy, Bruce R. Olevia Perches, MD. EF 60%   RIGHT  HEART CATH N/A 03/01/2018   Procedure: RIGHT HEART CATH;  Surgeon: Jolaine Artist, MD;  Location: [place] CV LAB;  Service: Cardiovascular;  Laterality: N/A;   SKIN CANCER EXCISION     "left ear; face" (02/28/2018)   TRANSURETHRAL RESECTION OF PROSTATE      ROS- all systems are reviewed and negative except as per HPI above  Current Outpatient Medications  Medication Sig Dispense Refill   acetaminophen (TYLENOL) 500 MG tablet Take 1 tablet (500 mg total) by mouth every 6 (six) hours as needed for mild pain (or Fever >/= 101). 30 tablet 0   apixaban (ELIQUIS) 2.5 MG TABS tablet Take 1 tablet (2.5 mg total) by mouth 2 (two) times daily. 60 tablet 5   Cholecalciferol (D3-1000) 25 MCG (1000 UT) capsule Take 1,000 Units by mouth daily.     feeding supplement (ENSURE ENLIVE / ENSURE PLUS) LIQD Take 237 mLs by mouth 3 (three) times daily between meals. 237 mL 12   ferrous sulfate 325 (65 FE) MG tablet Take 325 mg by mouth daily with breakfast.     finasteride (PROSCAR) 5 MG tablet Take 5 mg by mouth daily.     furosemide (LASIX) 20 MG tablet Take 10 mg by mouth daily.     lactulose (CHRONULAC) 10 GM/15ML solution Take 45 mLs (30 g total) by mouth 2 (two) times daily as needed for moderate constipation. 236 mL 0   Multiple Vitamins-Minerals (ICAPS AREDS 2 PO) Take 1 capsule by mouth 2 (two) times daily.     Multiple Vitamins-Minerals (ZINC PO) Take 1 capsule by mouth daily.     ondansetron (ZOFRAN) 4 MG tablet Take 1 tablet (4 mg total) by mouth every 8 (eight) hours as needed for up to 10 doses for nausea or vomiting. 10 tablet 0   rosuvastatin (CRESTOR) 20 MG tablet TAKE 1 TABLET BY MOUTH EVERY DAY 90 tablet 3   senna-docusate (SENOKOT-S) 8.6-50 MG tablet Take 1 tablet by mouth 2 (two) times daily.     vitamin B-12 (CYANOCOBALAMIN) 1000 MCG tablet Take 1,000 mcg by mouth daily.     No current facility-administered medications for this visit.    Physical Exam: Vitals:   05/12/21 0936   BP: 132/84  Pulse: 78  SpO2: 95%  Weight: 130 lb 6.4 oz (59.1 kg)  Height: 5\' 11"  (1.803 m)    GEN- The patient is elderly  appearing, alert and oriented x 3 today.   Head- normocephalic, atraumatic Eyes-  Sclera clear, conjunctiva pink Ears- hearing intact Oropharynx- clear Lungs- Clear to ausculation bilaterally, normal work of breathing Chest- ICD pocket is well healed Heart- Regular rate and rhythm  GI- soft, NT, ND, + BS Extremities- no clubbing, cyanosis, or edema  ICD interrogation- reviewed in detail today,  See PACEART report  ekg tracing ordered today is personally reviewed and shows sinus  Wt Readings from Last 3 Encounters:  05/12/21 130 lb 6.4 oz (59.1 kg)  04/02/21 100 lb (45.4 kg)  03/11/21 105 lb (  47.6 kg)    Assessment and Plan:  1.  Chronic systolic dysfunction/ ischemic CM/ prior VF arrest euvolemic today No ischemic symptoms Stable on an appropriate medical regimen Normal ICD function See Claudia Desanctis Art report He has known atrial lead noise which is reproducible again today with isometrics.  Previously we turned atrial sensing from auto to 0.7.  today, we turned auto back on and turned decay delay to post sense 30 msec at 62.5% and decay delay post pace 50 msec at 1.62mV. he is not device dependant today  Unfortunately he has poor cell reception in [place] where he lives.  We cannot follow his device remotely  We discussed option of turning ICD therapy off and also not replacing once ERI.  He is very clear at this time that he wishes to keep ICD therapies on and to proceed with ICD generator change once ERI.  2. Atrial fibrillation No afib by recent device interrogation He is on eliquis  3. HTN Stable No change required today    He is followed by [facility] (now graduated to palliative).  We discussed that at some point, we could opt to turn device therapies off if he has further clinical decline.  He wishes to keep every thing "as is" for  now.  Return to see EP APP in 6 months We discussed patient alert for battery ERI. We will need to follow him closer as he approaches ERI.  Will also need to have goals of care/ shared decision making discussion prior to generator change.  Thompson Grayer MD, [facility] 05/12/2021 9:49 AM

## 2021-05-12 NOTE — Patient Instructions (Addendum)
Medication Instructions:  Your physician recommends that you continue on your current medications as directed. Please refer to the Current Medication list given to you today. *If you need a refill on your cardiac medications before your next appointment, please call your pharmacy*  Lab Work: None. If you have labs (blood work) drawn today and your tests are completely normal, you will receive your results only by: [place] (if you have MyChart) OR A paper copy in the mail If you have any lab test that is abnormal or we need to change your treatment, we will call you to review the results.  Testing/Procedures: None.  Follow-Up: At [facility], you and your health needs are our priority.  As part of our continuing mission to provide you with exceptional heart care, we have created designated Provider Care Teams.  These Care Teams include your primary Cardiologist (physician) and Advanced Practice Providers (APPs -  Physician Assistants and Nurse Practitioners) who all work together to provide you with the care you need, when you need it.  Your physician wants you to follow-up in: 6 months with  one of the following Advanced Practice Providers on your designated Care Team:     Legrand Como "Jonni Sanger" Chalmers Cater, [place]   You will receive a reminder letter in the mail two months in advance. If you don't receive a letter, please call our office to schedule the follow-up appointment.  We recommend signing up for the patient portal called "MyChart".  Sign up information is provided on this After Visit Summary.  MyChart is used to connect with patients for Virtual Visits (Telemedicine).  Patients are able to view lab/test results, encounter notes, upcoming appointments, etc.  Non-urgent messages can be sent to your provider as well.   To learn more about what you can do with MyChart, go to [email or web address].    Any Other Special Instructions Will Be Listed Below (If Applicable).

## 2021-05-26 NOTE — Progress Notes (Deleted)
Cardiology Office Note   Date:  05/26/2021   ID:  Louis Reid, DOB 07-27-1932, MRN [id number]  PCP:  [name], MD  Cardiologist: Dr. Stanford Breed  No chief complaint on file.    History of Present Illness: Louis Reid is a 87 y.o. male who presents for ongoing assessment and management of coronary artery disease, history of coronary artery bypass grafting in 1996, anterior infarction on 2426 complicated by ventricular fibrillation arrest.  He did receive PCI to his LAD.  He had an ICD inserted on 4/14.  He also has a history of paroxysmal atrial fibrillation  He did have a repeat cardiac catheterization in August 2018 showing severe three-vessel coronary artery disease with patent SVG to diagonal, patent SVG to second and third marginals, patent LAD stent, atretic LIMA to LAD and occluded SVG to RCA.  This was followed by right heart catheterization on 9/19, which showed pulmonary capillary wedge pressure of 1, cardiac output of 4.1 and cardiac index of 2.5.  Echocardiogram in August 2021 showed an EF of 20% to 25% with mild left ventricular enlargement.  He is being followed by advanced heart failure clinic and is on hospice care.  He was last seen in the office by Coletta Memos, FNP, on 01/30/2021.  He was stable, continued on spironolactone and Jardiance.    Past Medical History:  Diagnosis Date   AICD (automatic cardioverter/defibrillator) present 09/13/2012   Anemia    CAD (coronary artery disease)    a. s/p remote CABG, b. AL STEMI c/b VF arrest in 05/2012 => LHC 05/10/12: Severe 3v CAD, S-OM1/2 ok, SVG-Dx ok, SVG-RCA occluded, LIMA-LAD atretic, proximal LAD occluded. EF 20% with anterior apical HK=> salvage PCI with POBA and thrombectomy of the occluded LAD;  c. 01/2017: atretic LIMA_LAD and known occluded SVG-RCA. Patent LAD stent and 70% proxRCA stenosis (FFR 0.88).    Chronic systolic heart failure ([place])    a. Echo 12/13: EF 20-25%, anteroseptal, inferior, apical HK,  mildly reduced RV function, trivial pericardial effusion;   b.  Echo 3/14:  Ant, septal, apical AK, EF 25%   Clotting disorder ([place])    Depression    "years ago" (02/28/2018)   History of kidney stones    HLD (hyperlipidemia)    Hypertension    "I've never had high blood pressure; on RX for other reasons" (02/28/2018)   Inguinal hernia    "have one now on my left side" (09/13/2012); (02/28/2018)   Ischemic cardiomyopathy    Myocardial infarction [facility]) 1996;  05/2012   Osteoporosis    Paroxysmal atrial fibrillation ([place]) 03/17/2016   noted on device interrogation,  will follow burden   Pectus excavatum    Pulmonary embolism ([place])    a. after adhesiolysis for SBO in 04/2012 => coumadin for 6 mos   Renal cyst    SBO (small bowel obstruction) [facility]) November 2013   s/p lysis of adhesions   Sinoatrial node dysfunction ([place])    Skin cancer    "left ear; face" (02/28/2018)   Ventricular fibrillation ([place]) 05/2012   . in setting of AL STEMI 12/13 => EF 20-25% => d/c on LifeVest (repeat echo planned in 08/2012)    Past Surgical History:  Procedure Laterality Date   CATARACT EXTRACTION W/ INTRAOCULAR LENS  IMPLANT, BILATERAL Bilateral    COLONOSCOPY     CORONARY ANGIOPLASTY WITH STENT PLACEMENT     "I've got 2 or 3 stents" (02/28/2018)   [place]   "  CABG X5" (09/13/2012)   CYSTOSCOPY/RETROGRADE/URETEROSCOPY/STONE EXTRACTION WITH BASKET  1990's   ESOPHAGOGASTRODUODENOSCOPY  05/08/2012   Procedure: ESOPHAGOGASTRODUODENOSCOPY (EGD);  Surgeon: Juanita Craver, MD;  Location: [facility] ENDOSCOPY;  Service: Endoscopy;  Laterality: N/A;  Nevin Bloodgood put on for Dr. Collene Mares , Dr. Collene Mares will call if she wants another time   IMPLANTABLE CARDIOVERTER DEFIBRILLATOR IMPLANT N/A 09/13/2012   SJM Ellipse ER implanted by Dr Rayann Heman for secondary prevention   INGUINAL HERNIA REPAIR Right 1996   INTRAVASCULAR PRESSURE WIRE/FFR STUDY N/A 01/20/2017   Procedure: INTRAVASCULAR PRESSURE WIRE/FFR STUDY;  Surgeon:  Wellington Hampshire, MD;  Location: [place] CV LAB;  Service: Cardiovascular;  Laterality: N/A;   LAPAROTOMY  04/27/2012   Procedure: EXPLORATORY LAPAROTOMY;  Surgeon: Gwenyth Ober, MD;  Location: [place];  Service: General;  Laterality: N/A;   LEFT HEART CATH AND CORS/GRAFTS ANGIOGRAPHY N/A 01/20/2017   Procedure: LEFT HEART CATH AND CORS/GRAFTS ANGIOGRAPHY;  Surgeon: Wellington Hampshire, MD;  Location: [place] CV LAB;  Service: Cardiovascular;  Laterality: N/A;   LEFT HEART CATHETERIZATION WITH CORONARY ANGIOGRAM N/A 05/10/2012   Procedure: LEFT HEART CATHETERIZATION WITH CORONARY ANGIOGRAM;  Surgeon: Burnell Blanks, MD;  Location: [facility] CATH LAB;  Service: Cardiovascular;  Laterality: N/A;   PTCA     percutaneous transluminal coronary intervention and brachy therapy, Bruce R. Olevia Perches, MD. EF 60%   RIGHT HEART CATH N/A 03/01/2018   Procedure: RIGHT HEART CATH;  Surgeon: Jolaine Artist, MD;  Location: [place] CV LAB;  Service: Cardiovascular;  Laterality: N/A;   SKIN CANCER EXCISION     "left ear; face" (02/28/2018)   TRANSURETHRAL RESECTION OF PROSTATE       Current Outpatient Medications  Medication Sig Dispense Refill   acetaminophen (TYLENOL) 500 MG tablet Take 1 tablet (500 mg total) by mouth every 6 (six) hours as needed for mild pain (or Fever >/= 101). 30 tablet 0   apixaban (ELIQUIS) 2.5 MG TABS tablet Take 1 tablet (2.5 mg total) by mouth 2 (two) times daily. 60 tablet 5   Cholecalciferol (D3-1000) 25 MCG (1000 UT) capsule Take 1,000 Units by mouth daily.     feeding supplement (ENSURE ENLIVE / ENSURE PLUS) LIQD Take 237 mLs by mouth 3 (three) times daily between meals. 237 mL 12   ferrous sulfate 325 (65 FE) MG tablet Take 325 mg by mouth daily with breakfast.     finasteride (PROSCAR) 5 MG tablet Take 5 mg by mouth daily.     furosemide (LASIX) 20 MG tablet Take 10 mg by mouth daily.     lactulose (CHRONULAC) 10 GM/15ML solution Take 45 mLs (30 g total) by mouth 2  (two) times daily as needed for moderate constipation. 236 mL 0   Multiple Vitamins-Minerals (ICAPS AREDS 2 PO) Take 1 capsule by mouth 2 (two) times daily.     Multiple Vitamins-Minerals (ZINC PO) Take 1 capsule by mouth daily.     ondansetron (ZOFRAN) 4 MG tablet Take 1 tablet (4 mg total) by mouth every 8 (eight) hours as needed for up to 10 doses for nausea or vomiting. 10 tablet 0   rosuvastatin (CRESTOR) 20 MG tablet TAKE 1 TABLET BY MOUTH EVERY DAY 90 tablet 3   senna-docusate (SENOKOT-S) 8.6-50 MG tablet Take 1 tablet by mouth 2 (two) times daily.     vitamin B-12 (CYANOCOBALAMIN) 1000 MCG tablet Take 1,000 mcg by mouth daily.     No current facility-administered medications for this visit.    Allergies:  Patient has no known allergies.    Social History:  The patient  reports that he has quit smoking. His smoking use included cigarettes. He has never used smokeless tobacco. He reports that he does not drink alcohol and does not use drugs.   Family History:  The patient's family history includes Cancer in his brother; Colon cancer in his brother; Heart disease in his brother and father; Other in his mother.    ROS: All other systems are reviewed and negative. Unless otherwise mentioned in H&P    PHYSICAL EXAM: VS:  There were no vitals taken for this visit. , BMI There is no height or weight on file to calculate BMI. GEN: Well nourished, well developed, in no acute distress HEENT: normal Neck: no JVD, carotid bruits, or masses Cardiac: ***RRR; no murmurs, rubs, or gallops,no edema  Respiratory:  Clear to auscultation bilaterally, normal work of breathing GI: soft, nontender, nondistended, + BS MS: no deformity or atrophy Skin: warm and dry, no rash Neuro:  Strength and sensation are intact Psych: euthymic mood, full affect   EKG:  EKG {ACTION; IS/IS [id number]} ordered today. The ekg ordered today demonstrates ***   Recent Labs: 01/27/2021: TSH 4.490 04/02/2021:  ALT 26; Hemoglobin 13.1; Platelets 150 04/04/2021: BUN 25; Creatinine, Ser 1.28; Magnesium 1.8; Potassium 3.9; Sodium 137    Lipid Panel    Component Value Date/Time   CHOL 89 (L) 08/24/2016 0931   TRIG 65 08/24/2016 0931   HDL 37 (L) 08/24/2016 0931   CHOLHDL 2.4 08/24/2016 0931   CHOLHDL 2.4 07/29/2015 0837   VLDL 15 07/29/2015 0837   LDLCALC 39 08/24/2016 0931      Wt Readings from Last 3 Encounters:  05/12/21 130 lb 6.4 oz (59.1 kg)  04/02/21 100 lb (45.4 kg)  03/11/21 105 lb (47.6 kg)      Other studies Reviewed: Additional studies/ records that were reviewed today include: ***. Review of the above records demonstrates: ***   ASSESSMENT AND PLAN:  1.  ***   Current medicines are reviewed at length with the patient today.  I have spent *** dedicated to the care of this patient on the date of this encounter to include pre-visit review of records, assessment, management and diagnostic testing,with shared decision making.  Labs/ tests ordered today include: *** Phill Myron. [place] Pugh, ANP, AACC   05/26/2021 4:15 PM    [place] [address] Office [phone] Fax [phone]  Notice: This dictation was prepared with Dragon dictation along with smaller phrase technology. Any transcriptional errors that result from this process are unintentional and may not be corrected upon review.

## 2021-05-30 ENCOUNTER — Ambulatory Visit: Payer: Medicare Other | Admitting: Adult Health

## 2021-06-05 ENCOUNTER — Telehealth: Payer: Self-pay

## 2021-06-05 NOTE — Telephone Encounter (Signed)
Attempted to contact patient's son [name] to schedule a Palliative Care consult appointment. No answer land unable to leave a message voicemail is full.

## 2021-06-09 NOTE — Progress Notes (Signed)
-- Patient did not show for appt. Note left for templating purposes only --  Advanced Heart Failure Clinic Note  Date:  06/09/2021   ID:  Louis Reid, DOB [birth year], MRN [id number]  Location: Home  Provider location: [facility] Type of Visit: Established patient  PCP:  [name], MD  Cardiologist:  Kirk Ruths, MD Primary HF: Terik Haughey  Chief Complaint: Heart Failure follow-up   History of Present Illness:  Louis Reid is an 88 y.o. male with CAD s/p CABG in 1996 with STEMI in 05/2012 and cath showing patent SVG-OM1-OM2 and SVG-D1 with occluded SVG-RCA and atretic LIMA-LAD with thrombectomy of proximal LAD occlusion, no significant changes on cath in 01/2017), HTN, HLD, ICM (s/p ICD placement in 2014), and chronic combined systolic and diastolic CHF.     Admitted from clinic 02/28/18 with concerns for low output HF. RHC as below showed low filling pressures and mildly reduced CO. Given gentle IVF. Palliative consulted as pt refused milrinone. Ultimately, decided for home with hospice with IC Started on Eliquis for new Afib.  He continues with palliative care after graduating hospice. Lisinopril stopped due to fatigue.   Saw Bunnie Domino in 10/02/19 and was stable. Referred by back to HF Clinic.EP but unable to follow his device remotely with bad cell phone service in [place].    Echo 8/21 EF 20-25% RV ok .  Admitted in 10/22 with fecal impaction and weakness. Discharged to SNF. Readmitted shortly thereafter with volume depletion and AKI as well as sacral decubiti. Patient refused to go back to SNF and d/c'd home.   Ws seen by EP in 12/22 and wanted to have ICD left on. No AF on device interrogation.    Past Medical History:  Diagnosis Date   AICD (automatic cardioverter/defibrillator) present 09/13/2012   Anemia    CAD (coronary artery disease)    a. s/p remote CABG, b. AL STEMI c/b VF arrest in 05/2012 => LHC  05/10/12: Severe 3v CAD, S-OM1/2 ok, SVG-Dx ok, SVG-RCA occluded, LIMA-LAD atretic, proximal LAD occluded. EF 20% with anterior apical HK=> salvage PCI with POBA and thrombectomy of the occluded LAD;  c. 01/2017: atretic LIMA_LAD and known occluded SVG-RCA. Patent LAD stent and 70% proxRCA stenosis (FFR 0.88).    Chronic systolic heart failure ([place])    a. Echo 12/13: EF 20-25%, anteroseptal, inferior, apical HK, mildly reduced RV function, trivial pericardial effusion;   b.  Echo 3/14:  Ant, septal, apical AK, EF 25%   Clotting disorder ([place])    Depression    "years ago" (02/28/2018)   History of kidney stones    HLD (hyperlipidemia)    Hypertension    "I've never had high blood pressure; on RX for other reasons" (02/28/2018)   Inguinal hernia    "have one now on my left side" (09/13/2012); (02/28/2018)   Ischemic cardiomyopathy    Myocardial infarction [facility]) 1996;  05/2012   Osteoporosis    Paroxysmal atrial fibrillation ([place]) 03/17/2016   noted on device interrogation,  will follow burden   Pectus excavatum    Pulmonary embolism ([place])    a. after adhesiolysis for SBO in 04/2012 => coumadin for 6 mos   Renal cyst    SBO (small bowel obstruction) [facility]) November 2013   s/p lysis of adhesions   Sinoatrial node dysfunction ([place])    Skin cancer    "left ear; face" (02/28/2018)   Ventricular fibrillation ([place]) 05/2012   .  in setting of AL STEMI 12/13 => EF 20-25% => d/c on LifeVest (repeat echo planned in 08/2012)   Past Surgical History:  Procedure Laterality Date   CATARACT EXTRACTION W/ INTRAOCULAR LENS  IMPLANT, BILATERAL Bilateral    COLONOSCOPY     CORONARY ANGIOPLASTY WITH STENT PLACEMENT     "I've got 2 or 3 stents" (02/28/2018)   [place]   "CABG X5" (09/13/2012)   CYSTOSCOPY/RETROGRADE/URETEROSCOPY/STONE EXTRACTION WITH BASKET  1990's   ESOPHAGOGASTRODUODENOSCOPY  05/08/2012   Procedure: ESOPHAGOGASTRODUODENOSCOPY (EGD);  Surgeon: Juanita Craver, MD;  Location:  [facility] ENDOSCOPY;  Service: Endoscopy;  Laterality: N/A;  Nevin Bloodgood put on for Dr. Collene Mares , Dr. Collene Mares will call if she wants another time   IMPLANTABLE CARDIOVERTER DEFIBRILLATOR IMPLANT N/A 09/13/2012   SJM Ellipse ER implanted by Dr Rayann Heman for secondary prevention   INGUINAL HERNIA REPAIR Right 1996   INTRAVASCULAR PRESSURE WIRE/FFR STUDY N/A 01/20/2017   Procedure: INTRAVASCULAR PRESSURE WIRE/FFR STUDY;  Surgeon: Wellington Hampshire, MD;  Location: [place] CV LAB;  Service: Cardiovascular;  Laterality: N/A;   LAPAROTOMY  04/27/2012   Procedure: EXPLORATORY LAPAROTOMY;  Surgeon: Gwenyth Ober, MD;  Location: [place];  Service: General;  Laterality: N/A;   LEFT HEART CATH AND CORS/GRAFTS ANGIOGRAPHY N/A 01/20/2017   Procedure: LEFT HEART CATH AND CORS/GRAFTS ANGIOGRAPHY;  Surgeon: Wellington Hampshire, MD;  Location: [place] CV LAB;  Service: Cardiovascular;  Laterality: N/A;   LEFT HEART CATHETERIZATION WITH CORONARY ANGIOGRAM N/A 05/10/2012   Procedure: LEFT HEART CATHETERIZATION WITH CORONARY ANGIOGRAM;  Surgeon: Burnell Blanks, MD;  Location: [facility] CATH LAB;  Service: Cardiovascular;  Laterality: N/A;   PTCA     percutaneous transluminal coronary intervention and brachy therapy, Bruce R. Olevia Perches, MD. EF 60%   RIGHT HEART CATH N/A 03/01/2018   Procedure: RIGHT HEART CATH;  Surgeon: Jolaine Artist, MD;  Location: [place] CV LAB;  Service: Cardiovascular;  Laterality: N/A;   SKIN CANCER EXCISION     "left ear; face" (02/28/2018)   TRANSURETHRAL RESECTION OF PROSTATE       Current Outpatient Medications  Medication Sig Dispense Refill   acetaminophen (TYLENOL) 500 MG tablet Take 1 tablet (500 mg total) by mouth every 6 (six) hours as needed for mild pain (or Fever >/= 101). 30 tablet 0   apixaban (ELIQUIS) 2.5 MG TABS tablet Take 1 tablet (2.5 mg total) by mouth 2 (two) times daily. 60 tablet 5   Cholecalciferol (D3-1000) 25 MCG (1000 UT) capsule Take 1,000 Units by mouth daily.     feeding  supplement (ENSURE ENLIVE / ENSURE PLUS) LIQD Take 237 mLs by mouth 3 (three) times daily between meals. 237 mL 12   ferrous sulfate 325 (65 FE) MG tablet Take 325 mg by mouth daily with breakfast.     finasteride (PROSCAR) 5 MG tablet Take 5 mg by mouth daily.     furosemide (LASIX) 20 MG tablet Take 10 mg by mouth daily.     lactulose (CHRONULAC) 10 GM/15ML solution Take 45 mLs (30 g total) by mouth 2 (two) times daily as needed for moderate constipation. 236 mL 0   Multiple Vitamins-Minerals (ICAPS AREDS 2 PO) Take 1 capsule by mouth 2 (two) times daily.     Multiple Vitamins-Minerals (ZINC PO) Take 1 capsule by mouth daily.     ondansetron (ZOFRAN) 4 MG tablet Take 1 tablet (4 mg total) by mouth every 8 (eight) hours as needed for up to 10 doses  for nausea or vomiting. 10 tablet 0   rosuvastatin (CRESTOR) 20 MG tablet TAKE 1 TABLET BY MOUTH EVERY DAY 90 tablet 3   senna-docusate (SENOKOT-S) 8.6-50 MG tablet Take 1 tablet by mouth 2 (two) times daily.     vitamin B-12 (CYANOCOBALAMIN) 1000 MCG tablet Take 1,000 mcg by mouth daily.     No current facility-administered medications for this encounter.    Allergies:   Patient has no known allergies.   Social History:  The patient  reports that he has quit smoking. His smoking use included cigarettes. He has never used smokeless tobacco. He reports that he does not drink alcohol and does not use drugs.   Family History:  The patient's family history includes Cancer in his brother; Colon cancer in his brother; Heart disease in his brother and father; Other in his mother.   ROS:  Please see the history of present illness.   All other systems are personally reviewed and negative.  There were no vitals filed for this visit.     Exam:   General:  Elderly. Weak  appearing. No resp difficulty HEENT: normal Neck: supple. no JVD. Carotids 2+ bilat; no bruits. No lymphadenopathy or thryomegaly appreciated. Cor: PMI nondisplaced. Irregular rate &  rhythm. No rubs, gallops or murmurs. Lungs: clear Abdomen: soft, nontender, nondistended. No hepatosplenomegaly. No bruits or masses. Good bowel sounds. Extremities: no cyanosis, clubbing, rash, edema Neuro: alert & orientedx3, cranial nerves grossly intact. moves all 4 extremities w/o difficulty. Affect pleasant   Recent Labs: 01/27/2021: TSH 4.490 04/02/2021: ALT 26; Hemoglobin 13.1; Platelets 150 04/04/2021: BUN 25; Creatinine, Ser 1.28; Magnesium 1.8; Potassium 3.9; Sodium 137  Personally reviewed   Wt Readings from Last 3 Encounters:  05/12/21 59.1 kg (130 lb 6.4 oz)  04/02/21 45.4 kg (100 lb)  03/11/21 47.6 kg (105 lb)      ASSESSMENT AND PLAN:  1. Chronic systolic CHF, ICM - Echo 7/32/20 LVEF 15%, Grade 1 DD, Trivial MR, Mildly reduced RV function, PA peak pressure 32 mm Hg.  - s/p St. Jude ICD. He wants to keep tachytherapies on for now. - EF dropped from 20-25% -> 15% from 01/2017 -> 2019.  - RHC 03/01/18 with low filling pressures and mildly reduced CO. - Echo 8/21 EF 20-25% RV ok . - He is doing better than I expected.  Stable NYHA III symptoms - Volume status ok  - Continue lasix 20 mg daily for now.  - Failed SGLT2i with AKI - No BB with for low output.  - Digoxin stopped due to high level in 2019 - Off lisinopril due to low BP and fatigue. Failed re-challenge with losartan 12.5 mg qhs  - Followed by [facility] > now graduated to palliative.  - ICD interrogated in clinic. Stable function.   2. CAD s/p CABG - Most recent cath 01/2017 with patent SVG to diagonal, patent SVG to OM 2, and OM - Patent proximal LAD stent. LIMA to LAD and SVG to RCA known occluded.  - No s/s of ischemia  - Continue statin.  - BB stopped with concern for low output. No ASA now that he is on Eliquis.   3. PAF - <1% burden by ICD interrogation.  - Continue Eliquis 2.5 bid. No bleeding   4. HLD - Continue statin. No change.     Signed, Glori Bickers, MD   06/09/2021 5:21 PM  Advanced Heart Failure [facility] [address] Heart and [place]  [address] [phone] (office) [phone] (fax)

## 2021-06-10 ENCOUNTER — Inpatient Hospital Stay (HOSPITAL_COMMUNITY)
Admission: RE | Admit: 2021-06-10 | Discharge: 2021-06-10 | Disposition: A | Discharge status: Home / Self Care | Payer: Medicare Other | Source: Ambulatory Visit | Attending: Internal Medicine | Admitting: Internal Medicine

## 2021-06-10 DIAGNOSIS — I5022 Chronic systolic (congestive) heart failure: Secondary | ICD-10-CM

## 2021-06-13 ENCOUNTER — Telehealth: Payer: Self-pay

## 2021-06-13 NOTE — Telephone Encounter (Signed)
Spoke with patient's son [name] and scheduled an in-person Palliative Consult for 06/25/21 @ 9AM  COVID screening was negative. No pets in home. Patient lives alone.   Consent obtained; updated Outlook/Netsmart/Team List and Epic.   Family is aware they may be receiving a call from provider the day before or day of to confirm appointment.

## 2021-06-19 ENCOUNTER — Telehealth: Payer: Self-pay | Admitting: *Deleted

## 2021-06-19 NOTE — Telephone Encounter (Signed)
Patient assistance application, MD portion, faxed to the company.

## 2021-06-25 ENCOUNTER — Other Ambulatory Visit: Payer: Self-pay

## 2021-06-25 ENCOUNTER — Other Ambulatory Visit: Payer: Medicare Other | Admitting: Hospice

## 2021-06-25 DIAGNOSIS — I5022 Chronic systolic (congestive) heart failure: Secondary | ICD-10-CM

## 2021-06-25 DIAGNOSIS — K5901 Slow transit constipation: Secondary | ICD-10-CM

## 2021-06-25 DIAGNOSIS — Z515 Encounter for palliative care: Secondary | ICD-10-CM

## 2021-06-25 NOTE — Progress Notes (Signed)
[facility] Consult Note Telephone: [phone]  Fax: [phone]  PATIENT NAME: Louis Reid DOB: 08/19/1932 MRN: [id number]  PRIMARY CARE PROVIDER:   [name], MD [name], MD [address] [place],  [place] [address]  REFERRING PROVIDER: [name], MD [name], MD [place],  [place] [address]  RESPONSIBLE PARTY:  [id number]     Name Relation Home Work Mobile   Louis, Reid   [phone]       Visit is to build trust and highlight Palliative Medicine as specialized medical care for people living with serious illness, aimed at facilitating better quality of life through symptoms relief, assisting with advance care planning and complex medical decision making. This is a follow up visit. Patient is recently discharged from hospice service because of steady improvement in his functional status.  RECOMMENDATIONS/PLAN: CODE STATUS: Ramifications and implications of code status discussed. Patient affirmed he is a Do Not Resuscitate.   Goals of Care: Goals of care include to maximize quality of life and symptom management.    Visit consisted of counseling and education dealing with the complex and emotionally intense issues of symptom management and palliative care in the setting of serious and potentially life-threatening illness. Palliative care team will continue to support patient, patient's family, and medical team.  I spent 20 minutes providing this consultation; this includes time spent with patient/family. More than 50% of the time in this consultation was spent on counseling and coordinating communication  ---------------------------------------------------------------------------------------------------------- Symptom management/Plan:  CHF:  no exacerbation at this time. Recently discharged from hospice service. No shortness  of breath, dizziness, palpitations, no edema.  Continue Lasix as ordered, reduced salt.  Follow up visit with PCP scheduled for next week. Routine CBC CMP Patient is followed by a Cardiologist. Constipation: Managed with OTC Senna S Afib: Followed by Cardiologist. Patient has ICD, continue on Eliquis. Follow up: Palliative care will continue to follow for complex medical decision making, advance care planning, and clarification of goals. Return 6 weeks or prn. Encouraged to call provider sooner with any concerns.  CHIEF COMPLAINT: Palliative follow up  HISTORY OF PRESENT ILLNESS:  Louis Reid a 88 y.o. male with multiple medical problems including systolic congestive heart failure, no exacerbation. Patient reports feeling better than before, not short of breath as before, in no pain or discomfort.  History of CAD, cardiomyopathy, unstable angina, HFrEF, edema,  ICD check 09/17/2020 showed normal device function. History obtained from review of EMR, discussion with primary team, family and/or patient. Records reviewed and summarized above. All 10 point systems reviewed and are negative except as documented in history of present illness above  Review and summarization of Epic records shows history from other than patient.   Palliative Care was asked to follow this patient o help address complex decision making in the context of advance care planning and goals of care clarification.   PHYSICAL EXAM: 118/70 R 18 P56 02 98% RA P59 Height/Weight: 5 feet 11 inches/130 Ibs up from 120 Ib about 6 months ago.  General: In no acute distress, appropriately dressed Cardiovascular: regular rate and rhythm; no edema in BLE Pulmonary: no cough, no increased work of breathing, normal respiratory effort Abdomen: soft, non tender, no guarding, positive bowel sounds in all quadrants GU:  no suprapubic tenderness Eyes: Normal lids, no discharge ENMT: Moist mucous membranes Musculoskeletal:  weakness, moderate  sarcopenia Skin: no  rash to visible skin, warm without cyanosis,  Psych: non-anxious affect Neurological: Weakness but otherwise non focal   PERTINENT MEDICATIONS:  Outpatient Encounter Medications as of 06/25/2021  Medication Sig   acetaminophen (TYLENOL) 500 MG tablet Take 1 tablet (500 mg total) by mouth every 6 (six) hours as needed for mild pain (or Fever >/= 101).   apixaban (ELIQUIS) 2.5 MG TABS tablet Take 1 tablet (2.5 mg total) by mouth 2 (two) times daily.   Cholecalciferol (D3-1000) 25 MCG (1000 UT) capsule Take 1,000 Units by mouth daily.   feeding supplement (ENSURE ENLIVE / ENSURE PLUS) LIQD Take 237 mLs by mouth 3 (three) times daily between meals.   ferrous sulfate 325 (65 FE) MG tablet Take 325 mg by mouth daily with breakfast.   finasteride (PROSCAR) 5 MG tablet Take 5 mg by mouth daily.   furosemide (LASIX) 20 MG tablet Take 10 mg by mouth daily.   lactulose (CHRONULAC) 10 GM/15ML solution Take 45 mLs (30 g total) by mouth 2 (two) times daily as needed for moderate constipation.   Multiple Vitamins-Minerals (ICAPS AREDS 2 PO) Take 1 capsule by mouth 2 (two) times daily.   Multiple Vitamins-Minerals (ZINC PO) Take 1 capsule by mouth daily.   ondansetron (ZOFRAN) 4 MG tablet Take 1 tablet (4 mg total) by mouth every 8 (eight) hours as needed for up to 10 doses for nausea or vomiting.   rosuvastatin (CRESTOR) 20 MG tablet TAKE 1 TABLET BY MOUTH EVERY DAY   senna-docusate (SENOKOT-S) 8.6-50 MG tablet Take 1 tablet by mouth 2 (two) times daily.   vitamin B-12 (CYANOCOBALAMIN) 1000 MCG tablet Take 1,000 mcg by mouth daily.   No facility-administered encounter medications on file as of 06/25/2021.    HOSPICE ELIGIBILITY/DIAGNOSIS: TBD  PAST MEDICAL HISTORY:  Past Medical History:  Diagnosis Date   AICD (automatic cardioverter/defibrillator) present 09/13/2012   Anemia    CAD (coronary artery disease)    a. s/p remote CABG, b. AL STEMI c/b VF arrest in 05/2012 => LHC  05/10/12: Severe 3v CAD, S-OM1/2 ok, SVG-Dx ok, SVG-RCA occluded, LIMA-LAD atretic, proximal LAD occluded. EF 20% with anterior apical HK=> salvage PCI with POBA and thrombectomy of the occluded LAD;  c. 01/2017: atretic LIMA_LAD and known occluded SVG-RCA. Patent LAD stent and 70% proxRCA stenosis (FFR 0.88).    Chronic systolic heart failure ([place])    a. Echo 12/13: EF 20-25%, anteroseptal, inferior, apical HK, mildly reduced RV function, trivial pericardial effusion;   b.  Echo 3/14:  Ant, septal, apical AK, EF 25%   Clotting disorder ([place])    Depression    "years ago" (02/28/2018)   History of kidney stones    HLD (hyperlipidemia)    Hypertension    "I've never had high blood pressure; on RX for other reasons" (02/28/2018)   Inguinal hernia    "have one now on my left side" (09/13/2012); (02/28/2018)   Ischemic cardiomyopathy    Myocardial infarction [facility]) 1996;  05/2012   Osteoporosis    Paroxysmal atrial fibrillation ([place]) 03/17/2016   noted on device interrogation,  will follow burden   Pectus excavatum    Pulmonary embolism ([place])    a. after adhesiolysis for SBO in 04/2012 => coumadin for 6 mos   Renal cyst    SBO (small bowel obstruction) [facility]) November 2013   s/p lysis of adhesions   Sinoatrial node dysfunction ([place])    Skin cancer    "left ear; face" (02/28/2018)   Ventricular fibrillation ([place])  05/2012   . in setting of AL STEMI 12/13 => EF 20-25% => d/c on LifeVest (repeat echo planned in 08/2012)     ALLERGIES: No Known Allergies    Thank you for the opportunity to participate in the care of JUNIE ENGRAM Please call our office at [phone] if we can be of additional assistance.  Note: Portions of this note were generated with L[employer]. Dictation errors may occur despite best attempts at proofreading.  Teodoro Spray, NP

## 2021-06-27 ENCOUNTER — Telehealth: Payer: Self-pay | Admitting: Cardiology

## 2021-06-27 NOTE — Telephone Encounter (Signed)
Pt c/o medication issue:  1. Name of Medication: apixaban (ELIQUIS) 2.5 MG TABS tablet  2. How are you currently taking this medication (dosage and times per day)? Take 1 tablet (2.5 mg total) by mouth 2 (two) times daily.  3. Are you having a reaction (difficulty breathing--STAT)? no  4. What is your medication issue? Patient ask that the nurse get in contact Louis Reid to get him help with getting his medication. Please advise

## 2021-06-27 NOTE — Telephone Encounter (Signed)
Spoke with patient regarding patient assistance application for Eliquis and the process. Mailed form to him.

## 2021-07-10 ENCOUNTER — Other Ambulatory Visit: Payer: Medicare Other | Admitting: *Deleted

## 2021-07-10 ENCOUNTER — Other Ambulatory Visit: Payer: Self-pay

## 2021-07-10 DIAGNOSIS — Z515 Encounter for palliative care: Secondary | ICD-10-CM

## 2021-07-15 ENCOUNTER — Other Ambulatory Visit: Payer: Medicare Other | Admitting: *Deleted

## 2021-07-15 ENCOUNTER — Other Ambulatory Visit: Payer: Self-pay

## 2021-07-15 ENCOUNTER — Other Ambulatory Visit: Payer: Medicare Other | Admitting: Hospice

## 2021-07-15 VITALS — BP 118/83 | HR 60 | Temp 97.8°F | Resp 18

## 2021-07-15 DIAGNOSIS — I5022 Chronic systolic (congestive) heart failure: Secondary | ICD-10-CM

## 2021-07-15 DIAGNOSIS — Z515 Encounter for palliative care: Secondary | ICD-10-CM

## 2021-07-15 DIAGNOSIS — R630 Anorexia: Secondary | ICD-10-CM

## 2021-07-15 DIAGNOSIS — K5901 Slow transit constipation: Secondary | ICD-10-CM

## 2021-07-15 NOTE — Progress Notes (Signed)
[facility] Palliative Care Consult Note Telephone: [phone]  Fax: [phone]  PATIENT NAME: Louis Reid DOB: 06/11/1932 MRN: [id number]  PRIMARY CARE PROVIDER:   [name], MD [name], MD [address] [place],  [place] [address]  REFERRING PROVIDER: [name], MD [name], MD [place],  [place] [address]  RESPONSIBLE PARTY:  Self Homer     Name Relation Home Work [address]   [place], [place]   [phone]      TELEHEALTH VISIT STATEMENT Due to the COVID-19 crisis, this visit was done via telemedicine from my office and it was initiated and consent by this patient and or family. Video-audio (telehealth) contact was unable to be done due to technical barriers from the patients side. I connected with patient OR PROXY by a telephone  and verified that I am speaking with the correct person. I discussed the limitations of evaluation and management by telemedicine. The patient expressed understanding and agreed to proceed. Palliative Care was asked to follow this patient to address advance care planning, complex medical decision making and goals of care clarification.    Visit is to build trust and highlight Palliative Medicine as specialized medical care for people living with serious illness, aimed at facilitating better quality of life through symptoms relief, assisting with advance care planning and complex medical decision making.  Clendenin Malachy Mood is with patient during visit.  This is a follow up visit.  Patient is recently discharged from hospice.  He continues to look into the prospect of going to a skilled nursing facility- [facility].   RECOMMENDATIONS/PLAN: CODE STATUS: Patient is a DO NOT RESUSCITATE  Goals of Care: Goals of care include to maximize quality of life and symptom management.    Visit consisted of counseling and education dealing  with the complex and emotionally intense issues of symptom management and palliative care in the setting of serious and potentially life-threatening illness. Palliative care team will continue to support patient, patient's family, and medical team.  Symptom management/Plan:  Poor appetite: Chronic poor appetite with associated low energy and fatigue.  Appetite fluctuates, sometimes, just bites and at other times he eats fairly good; but drinking more fluid than before.  Discussed the benefits of having the foods he enjoys on hand; having 4-6 small meals a day instead of 2 big meals a day.  It was agreed he will get some peanut butter, banana for quick sandwich which he enjoys.  Incorporate fruits and vegetables for balanced diet.  He likes Ensure, currently taking 1 bottle - 284ml daily.  Take Ensure 237 ml  twice  daily for nutritional supplementation.  It was agreed to try this for the next 2 weeks failing which, patient will be started on mirtazapine 7.5 mg at bedtime. Insomnia: Sleep hygiene.  5 mg po at daily night time -1 hour to bedtime CHF:  no exacerbation at this time.  Nursing reports no shortness of breath, dizziness, palpitations, no edema, vital signs within normal range.  Continue Lasix as ordered, reduced salt.  Follow up visit with PCP scheduled for next week. Routine CBC CMP Patient is followed by a Cardiologist. Constipation: Managed with OTC Senna S Afib: Followed by Cardiologist. Patient has ICD, continue on Eliquis. Follow up: Palliative care will continue to follow for complex medical decision making, advance care planning, and clarification of goals. Return 6 weeks or prn. Encouraged to call provider sooner  with any concerns.  CHIEF COMPLAINT: Palliative follow up  HISTORY OF PRESENT ILLNESS:  Louis Reid a 88 y.o. male with multiple medical problems including poor appetite, worsening with associated fatigue which impairs his activities of daily living; poor appetite  fluctuates between having a few bites and sometimes having a fairly good meal; oral intake of fluids is optimal.  Patient reports he is currently on Ensure which he enjoys and seems seems to help with his energy.  History of systolic congestive heart failure, no exacerbation; n no pain or discomfort.  History of CAD, cardiomyopathy, unstable angina, HFrEF, edema,  ICD check 09/17/2020 showed normal device function. History obtained from review of EMR, discussion with primary team, family and/or patient. Records reviewed and summarized above. All 10 point systems reviewed and are negative except as documented in history of present illness above  Review and summarization of Epic records shows history from other than patient.   Palliative Care was asked to follow this patient o help address complex decision making in the context of advance care planning and goals of care clarification.   PERTINENT MEDICATIONS:  Outpatient Encounter Medications as of 07/15/2021  Medication Sig   acetaminophen (TYLENOL) 500 MG tablet Take 1 tablet (500 mg total) by mouth every 6 (six) hours as needed for mild pain (or Fever >/= 101).   apixaban (ELIQUIS) 2.5 MG TABS tablet Take 1 tablet (2.5 mg total) by mouth 2 (two) times daily.   Cholecalciferol (D3-1000) 25 MCG (1000 UT) capsule Take 1,000 Units by mouth daily.   feeding supplement (ENSURE ENLIVE / ENSURE PLUS) LIQD Take 237 mLs by mouth 3 (three) times daily between meals.   ferrous sulfate 325 (65 FE) MG tablet Take 325 mg by mouth daily with breakfast.   finasteride (PROSCAR) 5 MG tablet Take 5 mg by mouth daily.   furosemide (LASIX) 20 MG tablet Take 10 mg by mouth daily.   lactulose (CHRONULAC) 10 GM/15ML solution Take 45 mLs (30 g total) by mouth 2 (two) times daily as needed for moderate constipation.   Multiple Vitamins-Minerals (ICAPS AREDS 2 PO) Take 1 capsule by mouth 2 (two) times daily.   Multiple Vitamins-Minerals (ZINC PO) Take 1 capsule by mouth daily.    ondansetron (ZOFRAN) 4 MG tablet Take 1 tablet (4 mg total) by mouth every 8 (eight) hours as needed for up to 10 doses for nausea or vomiting.   rosuvastatin (CRESTOR) 20 MG tablet TAKE 1 TABLET BY MOUTH EVERY DAY   senna-docusate (SENOKOT-S) 8.6-50 MG tablet Take 1 tablet by mouth 2 (two) times daily.   vitamin B-12 (CYANOCOBALAMIN) 1000 MCG tablet Take 1,000 mcg by mouth daily.   No facility-administered encounter medications on file as of 07/15/2021.    HOSPICE ELIGIBILITY/DIAGNOSIS: TBD  PAST MEDICAL HISTORY:  Past Medical History:  Diagnosis Date   AICD (automatic cardioverter/defibrillator) present 09/13/2012   Anemia    CAD (coronary artery disease)    a. s/p remote CABG, b. AL STEMI c/b VF arrest in 05/2012 => LHC 05/10/12: Severe 3v CAD, S-OM1/2 ok, SVG-Dx ok, SVG-RCA occluded, LIMA-LAD atretic, proximal LAD occluded. EF 20% with anterior apical HK=> salvage PCI with POBA and thrombectomy of the occluded LAD;  c. 01/2017: atretic LIMA_LAD and known occluded SVG-RCA. Patent LAD stent and 70% proxRCA stenosis (FFR 0.88).    Chronic systolic heart failure ([place])    a. Echo 12/13: EF 20-25%, anteroseptal, inferior, apical HK, mildly reduced RV function, trivial pericardial effusion;   b.  Echo  3/14:  Ant, septal, apical AK, EF 25%   Clotting disorder ([place])    Depression    "years ago" (02/28/2018)   History of kidney stones    HLD (hyperlipidemia)    Hypertension    "I've never had high blood pressure; on RX for other reasons" (02/28/2018)   Inguinal hernia    "have one now on my left side" (09/13/2012); (02/28/2018)   Ischemic cardiomyopathy    Myocardial infarction [facility]) 1996;  05/2012   Osteoporosis    Paroxysmal atrial fibrillation ([place]) 03/17/2016   noted on device interrogation,  will follow burden   Pectus excavatum    Pulmonary embolism ([place])    a. after adhesiolysis for SBO in 04/2012 => coumadin for 6 mos   Renal cyst    SBO (small bowel obstruction) [facility]) November 2013    s/p lysis of adhesions   Sinoatrial node dysfunction ([place])    Skin cancer    "left ear; face" (02/28/2018)   Ventricular fibrillation ([place]) 05/2012   . in setting of AL STEMI 12/13 => EF 20-25% => d/c on LifeVest (repeat echo planned in 08/2012)     ALLERGIES: No Known Allergies    Thank you for the opportunity to participate in the care of SALEM LEMBKE Please call our office at [phone] if we can be of additional assistance.  Note: Portions of this note were generated with Lobbyist. Dictation errors may occur despite best attempts at proofreading.  Teodoro Spray, NP

## 2021-07-15 NOTE — Progress Notes (Signed)
[facility] PALLIATIVE CARE RN NOTE  PATIENT NAME: Louis Reid DOB: May 02, 1933 MRN: [id number]  PRIMARY CARE PROVIDER: [name], MD  RESPONSIBLE PARTY: Wonda Horner (son) Acct ID - Guarantor Home Phone Work Phone Relationship Acct Type  [id number] SEARCY, MIYOSHI* [phone]  Self P/F     [place], [place], [place] [address]   Covid-19 Pre-screening Negative  RN visit completed with patient in his home. NP Laverda Sorenson brought in to visit via telehealth via secured link. See NP note for further details regarding visit.   PHYSICAL EXAM:   VITALS: Today's Vitals   07/15/21 1300  BP: 118/83  Pulse: 60  Resp: 18  Temp: 97.8 F (36.6 C)  TempSrc: Temporal  SpO2: 97%  PainSc: 0-No pain    LUNGS: clear to auscultation  CARDIAC: Cor RRR EXTREMITIES: Trace bilateral lower extremity edema SKIN:  Exposed skin is dry and intact   NEURO:  Alert and oriented x 3, increased generalized weakness, ambulatory   (Duration of visit and documentation 70 minutes)    Daryl Eastern, RN BSN

## 2021-07-16 ENCOUNTER — Other Ambulatory Visit: Payer: Self-pay

## 2021-07-16 ENCOUNTER — Other Ambulatory Visit: Payer: Medicare Other

## 2021-07-16 DIAGNOSIS — Z515 Encounter for palliative care: Secondary | ICD-10-CM

## 2021-07-18 ENCOUNTER — Emergency Department: Payer: Medicare Other

## 2021-07-18 ENCOUNTER — Inpatient Hospital Stay
Admission: EM | Admit: 2021-07-18 | Discharge: 2021-07-25 | DRG: 177 | Disposition: A | Discharge status: Home Health | Payer: Medicare Other | Attending: Internal Medicine | Admitting: Internal Medicine

## 2021-07-18 ENCOUNTER — Other Ambulatory Visit: Payer: Self-pay

## 2021-07-18 DIAGNOSIS — K5900 Constipation, unspecified: Secondary | ICD-10-CM | POA: Diagnosis not present

## 2021-07-18 DIAGNOSIS — N400 Enlarged prostate without lower urinary tract symptoms: Secondary | ICD-10-CM | POA: Diagnosis present

## 2021-07-18 DIAGNOSIS — U071 COVID-19: Secondary | ICD-10-CM | POA: Diagnosis not present

## 2021-07-18 DIAGNOSIS — Z955 Presence of coronary angioplasty implant and graft: Secondary | ICD-10-CM

## 2021-07-18 DIAGNOSIS — I480 Paroxysmal atrial fibrillation: Secondary | ICD-10-CM | POA: Diagnosis present

## 2021-07-18 DIAGNOSIS — Z87891 Personal history of nicotine dependence: Secondary | ICD-10-CM

## 2021-07-18 DIAGNOSIS — I252 Old myocardial infarction: Secondary | ICD-10-CM

## 2021-07-18 DIAGNOSIS — L8990 Pressure ulcer of unspecified site, unspecified stage: Secondary | ICD-10-CM

## 2021-07-18 DIAGNOSIS — Z95810 Presence of automatic (implantable) cardiac defibrillator: Secondary | ICD-10-CM

## 2021-07-18 DIAGNOSIS — I10 Essential (primary) hypertension: Secondary | ICD-10-CM | POA: Diagnosis present

## 2021-07-18 DIAGNOSIS — Z66 Do not resuscitate: Secondary | ICD-10-CM | POA: Diagnosis present

## 2021-07-18 DIAGNOSIS — I130 Hypertensive heart and chronic kidney disease with heart failure and stage 1 through stage 4 chronic kidney disease, or unspecified chronic kidney disease: Secondary | ICD-10-CM | POA: Diagnosis present

## 2021-07-18 DIAGNOSIS — I255 Ischemic cardiomyopathy: Secondary | ICD-10-CM | POA: Diagnosis present

## 2021-07-18 DIAGNOSIS — Z85828 Personal history of other malignant neoplasm of skin: Secondary | ICD-10-CM

## 2021-07-18 DIAGNOSIS — N179 Acute kidney failure, unspecified: Secondary | ICD-10-CM | POA: Diagnosis present

## 2021-07-18 DIAGNOSIS — I2510 Atherosclerotic heart disease of native coronary artery without angina pectoris: Secondary | ICD-10-CM | POA: Diagnosis present

## 2021-07-18 DIAGNOSIS — Z86711 Personal history of pulmonary embolism: Secondary | ICD-10-CM

## 2021-07-18 DIAGNOSIS — R627 Adult failure to thrive: Secondary | ICD-10-CM

## 2021-07-18 DIAGNOSIS — Z515 Encounter for palliative care: Secondary | ICD-10-CM

## 2021-07-18 DIAGNOSIS — R531 Weakness: Secondary | ICD-10-CM | POA: Diagnosis not present

## 2021-07-18 DIAGNOSIS — E43 Unspecified severe protein-calorie malnutrition: Secondary | ICD-10-CM | POA: Diagnosis present

## 2021-07-18 DIAGNOSIS — D696 Thrombocytopenia, unspecified: Secondary | ICD-10-CM | POA: Diagnosis present

## 2021-07-18 DIAGNOSIS — M810 Age-related osteoporosis without current pathological fracture: Secondary | ICD-10-CM | POA: Diagnosis present

## 2021-07-18 DIAGNOSIS — Z951 Presence of aortocoronary bypass graft: Secondary | ICD-10-CM

## 2021-07-18 DIAGNOSIS — Z681 Body mass index (BMI) 19 or less, adult: Secondary | ICD-10-CM

## 2021-07-18 DIAGNOSIS — I5022 Chronic systolic (congestive) heart failure: Secondary | ICD-10-CM | POA: Diagnosis present

## 2021-07-18 DIAGNOSIS — L89152 Pressure ulcer of sacral region, stage 2: Secondary | ICD-10-CM | POA: Diagnosis present

## 2021-07-18 DIAGNOSIS — N1832 Chronic kidney disease, stage 3b: Secondary | ICD-10-CM | POA: Diagnosis present

## 2021-07-18 DIAGNOSIS — Z79899 Other long term (current) drug therapy: Secondary | ICD-10-CM

## 2021-07-18 DIAGNOSIS — E860 Dehydration: Secondary | ICD-10-CM | POA: Diagnosis present

## 2021-07-18 DIAGNOSIS — E785 Hyperlipidemia, unspecified: Secondary | ICD-10-CM | POA: Diagnosis present

## 2021-07-18 DIAGNOSIS — Z7901 Long term (current) use of anticoagulants: Secondary | ICD-10-CM

## 2021-07-18 DIAGNOSIS — Z8249 Family history of ischemic heart disease and other diseases of the circulatory system: Secondary | ICD-10-CM

## 2021-07-18 LAB — HEPATIC FUNCTION PANEL
ALT: 35 U/L (ref 0–44)
AST: 42 U/L — ABNORMAL HIGH (ref 15–41)
Albumin: 3.5 g/dL (ref 3.5–5.0)
Alkaline Phosphatase: 93 U/L (ref 38–126)
Bilirubin, Direct: 0.3 mg/dL — ABNORMAL HIGH (ref 0.0–0.2)
Indirect Bilirubin: 0.6 mg/dL (ref 0.3–0.9)
Total Bilirubin: 0.9 mg/dL (ref 0.3–1.2)
Total Protein: 6 g/dL — ABNORMAL LOW (ref 6.5–8.1)

## 2021-07-18 LAB — BASIC METABOLIC PANEL
Anion gap: 10 (ref 5–15)
BUN: 37 mg/dL — ABNORMAL HIGH (ref 8–23)
CO2: 21 mmol/L — ABNORMAL LOW (ref 22–32)
Calcium: 9.2 mg/dL (ref 8.9–10.3)
Chloride: 110 mmol/L (ref 98–111)
Creatinine, Ser: 1.52 mg/dL — ABNORMAL HIGH (ref 0.61–1.24)
GFR, Estimated: 44 mL/min — ABNORMAL LOW (ref >60)
Glucose, Bld: 170 mg/dL — ABNORMAL HIGH (ref 70–99)
Potassium: 3.8 mmol/L (ref 3.5–5.1)
Sodium: 141 mmol/L (ref 135–145)

## 2021-07-18 LAB — CBC
HCT: 42.5 % (ref 39.0–52.0)
Hemoglobin: 13.6 g/dL (ref 13.0–17.0)
MCH: 31.7 pg (ref 26.0–34.0)
MCHC: 32 g/dL (ref 30.0–36.0)
MCV: 99.1 fL (ref 80.0–100.0)
Platelets: 116 10*3/uL — ABNORMAL LOW (ref 150–400)
RBC: 4.29 MIL/uL (ref 4.22–5.81)
RDW: 15.1 % (ref 11.5–15.5)
WBC: 7 10*3/uL (ref 4.0–10.5)
nRBC: 0 % (ref 0.0–0.2)

## 2021-07-18 LAB — T4, FREE: Free T4: 1.01 ng/dL (ref 0.61–1.12)

## 2021-07-18 LAB — RESP PANEL BY RT-PCR (FLU A&B, COVID) ARPGX2
Influenza A by PCR: NEGATIVE
Influenza B by PCR: NEGATIVE
SARS Coronavirus 2 by RT PCR: POSITIVE — AB

## 2021-07-18 LAB — TROPONIN I (HIGH SENSITIVITY): Troponin I (High Sensitivity): 32 ng/L — ABNORMAL HIGH (ref <18)

## 2021-07-18 LAB — BRAIN NATRIURETIC PEPTIDE: B Natriuretic Peptide: 371.8 pg/mL — ABNORMAL HIGH (ref 0.0–100.0)

## 2021-07-18 LAB — TSH: TSH: 5.639 u[IU]/mL — ABNORMAL HIGH (ref 0.350–4.500)

## 2021-07-18 MED ORDER — SENNA 8.6 MG PO TABS
1.0000 | ORAL_TABLET | Freq: Two times a day (BID) | ORAL | Status: DC
  Administered 2021-07-18 – 2021-07-19 (×3): 8.6 mg via ORAL
  Filled 2021-07-18 (×3): qty 1

## 2021-07-18 MED ORDER — SODIUM CHLORIDE 0.9 % IV SOLN
200.0000 mg | Freq: Once | INTRAVENOUS | Status: AC
  Administered 2021-07-18: 200 mg via INTRAVENOUS
  Filled 2021-07-18: qty 40

## 2021-07-18 MED ORDER — FINASTERIDE 5 MG PO TABS
5.0000 mg | ORAL_TABLET | Freq: Every day | ORAL | Status: DC
  Administered 2021-07-19 – 2021-07-25 (×7): 5 mg via ORAL
  Filled 2021-07-18 (×7): qty 1

## 2021-07-18 MED ORDER — ROSUVASTATIN CALCIUM 20 MG PO TABS
20.0000 mg | ORAL_TABLET | Freq: Every day | ORAL | Status: DC
  Administered 2021-07-18 – 2021-07-24 (×7): 20 mg via ORAL
  Filled 2021-07-18 (×9): qty 1

## 2021-07-18 MED ORDER — ENSURE ENLIVE PO LIQD
237.0000 mL | Freq: Three times a day (TID) | ORAL | Status: DC
  Administered 2021-07-18 – 2021-07-22 (×10): 237 mL via ORAL

## 2021-07-18 MED ORDER — SODIUM CHLORIDE 0.9 % IV BOLUS
500.0000 mL | Freq: Once | INTRAVENOUS | Status: AC
  Administered 2021-07-18: 500 mL via INTRAVENOUS

## 2021-07-18 MED ORDER — APIXABAN 2.5 MG PO TABS
2.5000 mg | ORAL_TABLET | Freq: Two times a day (BID) | ORAL | Status: DC
  Administered 2021-07-18 – 2021-07-25 (×14): 2.5 mg via ORAL
  Filled 2021-07-18 (×15): qty 1

## 2021-07-18 MED ORDER — SODIUM CHLORIDE 0.9 % IV SOLN
100.0000 mg | Freq: Every day | INTRAVENOUS | Status: AC
  Administered 2021-07-19 – 2021-07-22 (×4): 100 mg via INTRAVENOUS
  Filled 2021-07-18 (×4): qty 100

## 2021-07-18 MED ORDER — FERROUS SULFATE 325 (65 FE) MG PO TABS
325.0000 mg | ORAL_TABLET | Freq: Every day | ORAL | Status: DC
  Administered 2021-07-19 – 2021-07-25 (×6): 325 mg via ORAL
  Filled 2021-07-18 (×7): qty 1

## 2021-07-18 MED ORDER — ACETAMINOPHEN 325 MG PO TABS: 650.0000 mg | ORAL_TABLET | Freq: Four times a day (QID) | ORAL | Status: DC | PRN

## 2021-07-18 MED ORDER — VITAMIN D3 25 MCG (1000 UNIT) PO TABS
1000.0000 [IU] | ORAL_TABLET | Freq: Every day | ORAL | Status: DC
  Administered 2021-07-19 – 2021-07-25 (×7): 1000 [IU] via ORAL
  Filled 2021-07-18 (×13): qty 1

## 2021-07-18 MED ORDER — MELATONIN 5 MG PO TABS
5.0000 mg | ORAL_TABLET | Freq: Once | ORAL | Status: AC
  Administered 2021-07-18: 5 mg via ORAL
  Filled 2021-07-18: qty 1

## 2021-07-18 MED ORDER — ACETAMINOPHEN 650 MG RE SUPP: 650.0000 mg | Freq: Four times a day (QID) | RECTAL | Status: DC | PRN

## 2021-07-18 MED ORDER — POLYETHYLENE GLYCOL 3350 17 G PO PACK: 17.0000 g | PACK | Freq: Every day | ORAL | Status: DC | PRN

## 2021-07-18 MED ORDER — MIRTAZAPINE 15 MG PO TABS
15.0000 mg | ORAL_TABLET | Freq: Every day | ORAL | Status: DC
  Administered 2021-07-18 – 2021-07-24 (×7): 15 mg via ORAL
  Filled 2021-07-18 (×7): qty 1

## 2021-07-18 NOTE — Progress Notes (Signed)
Cross Cover Dose of melatonin ordered for patient reports unable to sleep for past 3-4 nights

## 2021-07-18 NOTE — ED Triage Notes (Signed)
Pt to ED via POV from home. Pt on hospice care but states wants tx. Pt reports loss of appetite, increased weakness and SOB. Pt also reports "gas" pains in his abdomen. Pt with hx CHF and has defibrillator in place.

## 2021-07-18 NOTE — ED Notes (Signed)
Patient transported to X-ray 

## 2021-07-18 NOTE — H&P (Addendum)
History and Physical    Patient: Louis Reid JSE:[id number] DOB: August 21, 1932 DOA: 07/18/2021 DOS: the patient was seen and examined on 07/18/2021 PCP: [name], MD  Patient coming from: Home  Chief Complaint:  Chief Complaint  Patient presents with   Weakness    HPI: JWAN HORNBAKER is a 88 y.o. male with medical history significant of coronary disease, heart failure ejection fraction 20%, paroxysmal atrial fibrillation, chronic kidney disease stage IIIb, BPH, hypertension, dyslipidemia, PE, malnutrition who presented with generalized weakness and poor appetite.  Multiple recent hospitalizations with similar symptoms and failure to thrive. Apparently patient lives alone and unable to take care of himself.  He was in hospice patient but revoked his hospice and not currently following with outpatient palliative care.  He has been discharged to rehab few times, offered rehab during most recent admission in October 2022 but wanted to go home with home health.  Patient denies any recent illnesses.  No chest pain or shortness of breath, stating that he just have no energy.  Very poor appetite which fluctuates.  No fever or chills.  No recent change in bowel habits.  No urinary symptoms.  Patient was hemodynamically stable in ED with labs only pertinent for mild AKI With creatinine of 1.52 and BUN of 37, baseline around 1.2 in October 2022.  Mildly elevated TSH at 5.6 with normal free T4, BNP at 371 which is close to his baseline, COVID PCR pending, chest x-ray with trace bilateral pleural effusions, CT head with no acute findings.   TRH was consulted for admission secondary to failure to thrive and placement.  Addendum.  Received message and after hours that COVID PCR came back positive, patient pretty much asymptomatic except worsening weakness.  Patient is high risk so decided to give him remdesivir.  Review of Systems: As mentioned in the history of present illness. All other  systems reviewed and are negative. Past Medical History:  Diagnosis Date   AICD (automatic cardioverter/defibrillator) present 09/13/2012   Anemia    CAD (coronary artery disease)    a. s/p remote CABG, b. AL STEMI c/b VF arrest in 05/2012 => LHC 05/10/12: Severe 3v CAD, S-OM1/2 ok, SVG-Dx ok, SVG-RCA occluded, LIMA-LAD atretic, proximal LAD occluded. EF 20% with anterior apical HK=> salvage PCI with POBA and thrombectomy of the occluded LAD;  c. 01/2017: atretic LIMA_LAD and known occluded SVG-RCA. Patent LAD stent and 70% proxRCA stenosis (FFR 0.88).    Chronic systolic heart failure ([place])    a. Echo 12/13: EF 20-25%, anteroseptal, inferior, apical HK, mildly reduced RV function, trivial pericardial effusion;   b.  Echo 3/14:  Ant, septal, apical AK, EF 25%   Clotting disorder ([place])    Depression    "years ago" (02/28/2018)   History of kidney stones    HLD (hyperlipidemia)    Hypertension    "I've never had high blood pressure; on RX for other reasons" (02/28/2018)   Inguinal hernia    "have one now on my left side" (09/13/2012); (02/28/2018)   Ischemic cardiomyopathy    Myocardial infarction [facility]) 1996;  05/2012   Osteoporosis    Paroxysmal atrial fibrillation ([place]) 03/17/2016   noted on device interrogation,  will follow burden   Pectus excavatum    Pulmonary embolism ([place])    a. after adhesiolysis for SBO in 04/2012 => coumadin for 6 mos   Renal cyst    SBO (small bowel obstruction) ([place]) November 2013   s/p lysis of adhesions  Sinoatrial node dysfunction ([facility])    Skin cancer    "left ear; face" (02/28/2018)   Ventricular fibrillation ([place]) 05/2012   . in setting of AL STEMI 12/13 => EF 20-25% => d/c on LifeVest (repeat echo planned in 08/2012)   Past Surgical History:  Procedure Laterality Date   CATARACT EXTRACTION W/ INTRAOCULAR LENS  IMPLANT, BILATERAL Bilateral    COLONOSCOPY     CORONARY ANGIOPLASTY WITH STENT PLACEMENT     "I've got 2 or 3 stents" (02/28/2018)   [place]   "CABG X5" (09/13/2012)   CYSTOSCOPY/RETROGRADE/URETEROSCOPY/STONE EXTRACTION WITH BASKET  1990's   ESOPHAGOGASTRODUODENOSCOPY  05/08/2012   Procedure: ESOPHAGOGASTRODUODENOSCOPY (EGD);  Surgeon: Juanita Craver, MD;  Location: [facility] ENDOSCOPY;  Service: Endoscopy;  Laterality: N/A;  Nevin Bloodgood put on for Dr. Collene Mares , Dr. Collene Mares will call if she wants another time   IMPLANTABLE CARDIOVERTER DEFIBRILLATOR IMPLANT N/A 09/13/2012   SJM Ellipse ER implanted by Dr Rayann Heman for secondary prevention   INGUINAL HERNIA REPAIR Right 1996   INTRAVASCULAR PRESSURE WIRE/FFR STUDY N/A 01/20/2017   Procedure: INTRAVASCULAR PRESSURE WIRE/FFR STUDY;  Surgeon: Wellington Hampshire, MD;  Location: [facility] CV LAB;  Service: Cardiovascular;  Laterality: N/A;   LAPAROTOMY  04/27/2012   Procedure: EXPLORATORY LAPAROTOMY;  Surgeon: Gwenyth Ober, MD;  Location: [facility];  Service: General;  Laterality: N/A;   LEFT HEART CATH AND CORS/GRAFTS ANGIOGRAPHY N/A 01/20/2017   Procedure: LEFT HEART CATH AND CORS/GRAFTS ANGIOGRAPHY;  Surgeon: Wellington Hampshire, MD;  Location: [facility] CV LAB;  Service: Cardiovascular;  Laterality: N/A;   LEFT HEART CATHETERIZATION WITH CORONARY ANGIOGRAM N/A 05/10/2012   Procedure: LEFT HEART CATHETERIZATION WITH CORONARY ANGIOGRAM;  Surgeon: Burnell Blanks, MD;  Location[facility] CATH LAB;  Service: Cardiovascular;  Laterality: N/A;   PTCA     percutaneous transluminal coronary intervention and brachy therapy, Bruce R. Olevia Perches, MD. EF 60%   RIGHT HEART CATH N/A 03/01/2018   Procedure: RIGHT HEART CATH;  Surgeon: Jolaine Artist, MD;  Location: [place] [facility] CV LAB;  Service: Cardiovascular;  Laterality: N/A;   SKIN CANCER EXCISION     "left ear; face" (02/28/2018)   TRANSURETHRAL RESECTION OF PROSTATE     Social History:  reports that he has quit smoking. His smoking use included cigarettes. He has never used smokeless tobacco. He reports that he does not drink alcohol and does not use  drugs.  No Known Allergies  Family History  Problem Relation Age of Onset   Heart disease Father    Other Mother        brain tumor   Colon cancer Brother    Cancer Brother        Liver   Heart disease Brother     Prior to Admission medications   Medication Sig Start Date End Date Taking? Authorizing Provider  acetaminophen (TYLENOL) 500 MG tablet Take 1 tablet (500 mg total) by mouth every 6 (six) hours as needed for mild pain (or Fever >/= 101). 03/15/21   Cherene Altes, MD  apixaban (ELIQUIS) 2.5 MG TABS tablet Take 1 tablet (2.5 mg total) by mouth 2 (two) times daily. 07/09/20   Bensimhon, Shaune Pascal, MD  Cholecalciferol (D3-1000) 25 MCG (1000 UT) capsule Take 1,000 Units by mouth daily.    [provider]  feeding supplement (ENSURE ENLIVE / ENSURE PLUS) LIQD Take 237 mLs by mouth 3 (three) times daily between meals. 03/15/21   Cherene Altes, MD  ferrous sulfate 325 (65 FE) MG tablet Take 325 mg by mouth daily with breakfast.    [provider]  finasteride (PROSCAR) 5 MG tablet Take 5 mg by mouth daily. 08/30/19   [provider]  furosemide (LASIX) 20 MG tablet Take 10 mg by mouth daily.    [provider]  lactulose (CHRONULAC) 10 GM/15ML solution Take 45 mLs (30 g total) by mouth 2 (two) times daily as needed for moderate constipation. 03/15/21   Cherene Altes, MD  Multiple Vitamins-Minerals (ICAPS AREDS 2 PO) Take 1 capsule by mouth 2 (two) times daily.    [provider]  Multiple Vitamins-Minerals (ZINC PO) Take 1 capsule by mouth daily.    [provider]  ondansetron (ZOFRAN) 4 MG tablet Take 1 tablet (4 mg total) by mouth every 8 (eight) hours as needed for up to 10 doses for nausea or vomiting. 03/11/21   Lucrezia Starch, MD  rosuvastatin (CRESTOR) 20 MG tablet TAKE 1 TABLET BY MOUTH EVERY DAY 04/14/21   Lelon Perla, MD  senna-docusate (SENOKOT-S) 8.6-50 MG tablet Take 1 tablet by mouth 2 (two) times daily.  03/15/21   Cherene Altes, MD  vitamin B-12 (CYANOCOBALAMIN) 1000 MCG tablet Take 1,000 mcg by mouth daily.    [provider]    Physical Exam: Vitals:   07/18/21 1342 07/18/21 1348 07/18/21 1721  BP: 138/73  128/68  Pulse: 83  80  Resp: 17  16  Temp: 98.4 F (36.9 C)    TempSrc: Oral    SpO2: 98%  99%  Weight:  59 kg   Height:  5\' 11"  (1.803 m)    Vitals:   07/18/21 1342 07/18/21 1348 07/18/21 1721  BP: 138/73  128/68  Pulse: 83  80  Resp: 17  16  Temp: 98.4 F (36.9 C)    TempSrc: Oral    SpO2: 98%  99%  Weight:  59 kg   Height:  5\' 11"  (1.803 m)    General: Vital signs reviewed.  Malnourished elderly man, in no acute distress and cooperative with exam.  Head: Normocephalic and atraumatic. Eyes: EOMI, conjunctivae normal, no scleral icterus.  Neck: Supple, trachea midline, normal ROM,  Cardiovascular: RRR, S1 normal, S2 normal, no murmurs, gallops, or rubs. Pulmonary/Chest: Clear to auscultation bilaterally, no wheezes, rales, or rhonchi. Abdominal: Soft, non-tender, non-distended, BS +,  Extremities: No lower extremity edema bilaterally,  pulses symmetric and intact bilaterally. No cyanosis or clubbing. Neurological: A&O x3, Strength is normal and symmetric bilaterally, cranial nerve II-XII are grossly intact, no focal motor deficit, sensory intact to light touch bilaterally.  Psychiatric: Normal mood and affect. speech and behavior is normal. Cognition and memory are normal.   Data Reviewed:  Labs and imaging was reviewed which was only pertinent for mild AKI With creatinine of 1.52 and BUN of 37, baseline around 1.2 in October 2022.  Mildly elevated TSH at 5.6 with normal free T4, BNP at 371 which is close to his baseline, chest x-ray with trace bilateral pleural effusions, CT head with no acute findings.  EKG revealed sinus rhythm, few premature atrial contractions and T wave inversion in lateral leads which were present on prior EKGs  Assessment and  Plan: Failure to thrive.  Multiple presentations for similar picture.  Patient lives alone and unable to take care of himself.  Poor appetite, stating that he can only take couple of bites at a time. -Try Remeron 15 mg at bedtime -PT/OT evaluation -Dietitian  consult -TOC consult -Palliative care consult  AKI with CKD stage IIIb.  Most likely secondary to poor p.o. intake. Received 500 cc IV fluid in ED. -Encourage p.o. hydration, will try to avoid further IV fluids due to very low EF.  Chronic combined CHF s/p AICD EF 20-25% on echo done 01/08/2020.  He was on a very low-dose Lasix at 10 mg daily at home due to multiple presentations with dehydration. -Monitor volume status -Keep holding Lasix  Paroxysmal atrial fibrillation.  Currently in sinus rhythm. -Continue home Eliquis  Hypertension.  Mentioned in problem but patient is not on any antihypertensives.  Blood pressure within goal. -Continue to monitor  BPH. -Continue home finasteride  Advance Care Planning:   Code Status: Prior DNR  Consults: Palliative care  Family Communication: Discussed with son at bedside.  He would like him to go to a facility near him.  He lives out of town about hour and a half away in [place].  Severity of Illness: The appropriate patient status for this patient is OBSERVATION. Observation status is judged to be reasonable and necessary in order to provide the required intensity of service to ensure the patient's safety. The patient's presenting symptoms, physical exam findings, and initial radiographic and laboratory data in the context of their medical condition is felt to place them at decreased risk for further clinical deterioration. Furthermore, it is anticipated that the patient will be medically stable for discharge from the hospital within 2 midnights of admission.   Author: Lorella Nimrod, MD 07/18/2021 5:43 PM  For on call review [email or web address].

## 2021-07-18 NOTE — ED Notes (Signed)
First Nurse Note:  Pt to ED via POV, pt states that he is a hospice patient and is currently under palliative care. Pt reports that he has lack of energy and loss of taste. Pt is speaking in complete sentences and is in NAD.

## 2021-07-18 NOTE — Consult Note (Signed)
Remdesivir - Pharmacy Brief Note   O:  ALT: 35 CXR: Trace bilateral pleural effusions. SpO2: 99% on RA   A/P:  Remdesivir 200 mg IVPB once followed by 100 mg IVPB daily x 4 days.   Darnelle Bos, PharmD 07/18/2021 7:35 PM

## 2021-07-18 NOTE — Progress Notes (Signed)
[facility] ED11 [employer] [facility]) Hosptial Liaison note:  This patient is currently enrolled in [facility] outpatient-based Palliative Care. Will continue to follow for disposition.  Please call with any outpatient palliative questions or concerns.  Thank you, Lorelee Market, LPN [facility] Liaison [phone]

## 2021-07-18 NOTE — ED Provider Notes (Signed)
[facility] Provider Note    Event Date/Time   First MD Initiated Contact with Patient 07/18/21 1549     (approximate)   History   Weakness   HPI  Louis Reid is a 88 y.o. male with coronary disease, heart failure with an EF of 20%, paroxysmal A-fib, CKD, who comes in for weakness.  Patient has chronic history of poor appetite and decreasing energy.  He reports feeling very tired even with small amounts of exertion.  He denies any falls or hitting his head.  He reports being out of his Eliquis for the past week but denies any other concerns.  States that he is now too weak to take care of himself at home and that typically he has to come in to get IV fluids and then he would like to go to a rehab facility to get stronger.  I reviewed patient's discharge summary from 10/26 to 10/28.  At that time he come in for weakness as well.  At that time he was admitted for AKI and discharged after some fluids.  I reviewed patient's palliative care note from 07/15/2021 where patient was recently taken off hospice and family was trying to get him into SNF.   Physical Exam   Triage Vital Signs: ED Triage Vitals  Enc Vitals Group     BP 07/18/21 1342 138/73     Pulse Rate 07/18/21 1342 83     Resp 07/18/21 1342 17     Temp 07/18/21 1342 98.4 F (36.9 C)     Temp Source 07/18/21 1342 Oral     SpO2 07/18/21 1342 98 %     Weight 07/18/21 1348 130 lb 1.1 oz (59 kg)     Height 07/18/21 1348 5\' 11"  (1.803 m)     Head Circumference --      Peak Flow --      Pain Score 07/18/21 1347 0     Pain Loc --      Pain Edu? --      Excl. in [place]? --     Most recent vital signs: Vitals:   07/18/21 1342  BP: 138/73  Pulse: 83  Resp: 17  Temp: 98.4 F (36.9 C)  SpO2: 98%     General: Awake, no distress.  CV:  Good peripheral perfusion.  Resp:  Normal effort.  Abd:  No distention.  Soft and nontender Other:  Equal strength in arms and legs.   ED Results / Procedures  / Treatments   Labs (all labs ordered are listed, but only abnormal results are displayed) Labs Reviewed  BASIC METABOLIC PANEL - Abnormal; Notable for the following components:      Result Value   CO2 21 (*)    Glucose, Bld 170 (*)    BUN 37 (*)    Creatinine, Ser 1.52 (*)    GFR, Estimated 44 (*)    All other components within normal limits  CBC - Abnormal; Notable for the following components:   Platelets 116 (*)    All other components within normal limits  URINALYSIS, ROUTINE W REFLEX MICROSCOPIC  CBG MONITORING, ED     EKG  My interpretation of EKG:  Normal sinus rate of 83 without any ST elevation or T wave inversions, normal intervals  RADIOLOGY I have reviewed the xray personally and agree with radiology read trace bilateral pleural effusions   PROCEDURES:  Critical Care performed: No  .1-3 Lead EKG Interpretation Performed by: Marjean Donna  E, MD Authorized by: Vanessa [place]in[place], MD     Interpretation: normal     ECG rate:  80   ECG rate assessment: normal     Rhythm: sinus rhythm     Ectopy: none     Conduction: normal     MEDICATIONS ORDERED IN ED: Medications  acetaminophen (TYLENOL) tablet 650 mg (has no administration in time range)    Or  acetaminophen (TYLENOL) suppository 650 mg (has no administration in time range)  senna (SENOKOT) tablet 8.6 mg (has no administration in time range)  polyethylene glycol (MIRALAX / GLYCOLAX) packet 17 g (has no administration in time range)  apixaban (ELIQUIS) tablet 2.5 mg (has no administration in time range)  Cholecalciferol 1,000 Units (has no administration in time range)  feeding supplement (ENSURE ENLIVE / ENSURE PLUS) liquid 237 mL (has no administration in time range)  ferrous sulfate tablet 325 mg (has no administration in time range)  finasteride (PROSCAR) tablet 5 mg (has no administration in time range)  rosuvastatin (CRESTOR) tablet 20 mg (has no administration in time range)  mirtazapine (REMERON)  tablet 15 mg (has no administration in time range)  sodium chloride 0.9 % bolus 500 mL (500 mLs Intravenous New Bag/Given 07/18/21 1804)     IMPRESSION / MDM / ASSESSMENT AND PLAN / ED COURSE  I reviewed the triage vital signs and the nursing notes.  Patient with complex medical history who comes in with concern for failure to thrive, dehydration Differential diagnosis includes, but is not limited to, Electra abnormalities, AKI.  We will get CT head to make sure no evidence of intercranial hemorrhage or hydrocephalus.  BMP slightly uptrending from CR baseline of 1.2-1.5.  We will give a very small dose of IV fluid given patient's poor EF I do not want to fluid overload him but he is adamant that typically when he gets IV fluids he feels better.  Thyroid normal  CBC no anemia  I discussed with the hospital team for admission for failure to thrive, AKI and need for placement.  After patient was admitted patient did have a positive COVID test which could also explain his weakness.  I did message the attending doctor and placed patient under contact and airborne precautions.   The patient is on the cardiac monitor to evaluate for evidence of arrhythmia and/or significant heart rate changes.       FINAL CLINICAL IMPRESSION(S) / ED DIAGNOSES   Final diagnoses:  Weakness  COVID  AKI (acute kidney injury) ([place])  Failure to thrive in adult     Rx / DC Orders   ED Discharge Orders     None        Note:  This document was prepared using Dragon voice recognition software and may include unintentional dictation errors.   Vanessa Roland, MD 07/18/21 [phone]

## 2021-07-19 DIAGNOSIS — U071 COVID-19: Secondary | ICD-10-CM | POA: Diagnosis not present

## 2021-07-19 DIAGNOSIS — I5022 Chronic systolic (congestive) heart failure: Secondary | ICD-10-CM | POA: Diagnosis not present

## 2021-07-19 DIAGNOSIS — R627 Adult failure to thrive: Secondary | ICD-10-CM | POA: Diagnosis not present

## 2021-07-19 LAB — BASIC METABOLIC PANEL
Anion gap: 7 (ref 5–15)
BUN: 35 mg/dL — ABNORMAL HIGH (ref 8–23)
CO2: 25 mmol/L (ref 22–32)
Calcium: 8.8 mg/dL — ABNORMAL LOW (ref 8.9–10.3)
Chloride: 111 mmol/L (ref 98–111)
Creatinine, Ser: 1.28 mg/dL — ABNORMAL HIGH (ref 0.61–1.24)
GFR, Estimated: 55 mL/min — ABNORMAL LOW (ref >60)
Glucose, Bld: 91 mg/dL (ref 70–99)
Potassium: 3.8 mmol/L (ref 3.5–5.1)
Sodium: 143 mmol/L (ref 135–145)

## 2021-07-19 MED ORDER — ADULT MULTIVITAMIN W/MINERALS CH
1.0000 | ORAL_TABLET | Freq: Every day | ORAL | Status: DC
  Administered 2021-07-19 – 2021-07-25 (×6): 1 via ORAL
  Filled 2021-07-19 (×7): qty 1

## 2021-07-19 MED ORDER — FUROSEMIDE 20 MG PO TABS
10.0000 mg | ORAL_TABLET | Freq: Every day | ORAL | Status: DC
  Administered 2021-07-19 – 2021-07-25 (×7): 10 mg via ORAL
  Filled 2021-07-19 (×7): qty 1

## 2021-07-19 MED ORDER — JUVEN PO PACK
1.0000 | PACK | Freq: Two times a day (BID) | ORAL | Status: DC
  Administered 2021-07-19 – 2021-07-21 (×3): 1 via ORAL

## 2021-07-19 NOTE — Evaluation (Signed)
Occupational Therapy Evaluation Patient Details Name: Louis Reid MRN: [id number] DOB: [birth year] Today's Date: 07/19/2021   History of Present Illness The patient is an 88 yo male that presented to the ED for weakness, poor appetite, inability to care for himself. PMH of recent hospital admissions, coronary disease, heart failure ejection fraction 20%, paroxysmal atrial fibrillation, chronic kidney disease stage IIIb, BPH, hypertension, dyslipidemia, PE, was on hospice but transitioned to palliative, MI, AICD.   Clinical Impression   Pt was seen for OT evaluation this date. Prior to hospital admission, pt was generally independent with ADL and denies use of AD at home 2/2 small spaces. Pt endorses increased fatigue with limited exertion and verbalizes surprise at how he got Covid-19 as he has done his part to minimize contact and get vaccinated. Pt performs transfers with SBA-CGA and negotiates obstacles in room with RW and PRN VC for safety/RW mgt. Pt able to doff/don socks while seated in recliner without direct assist, but does endorse increased effort to perform. Currently pt demonstrates impairments as described below (See OT problem list) which functionally limit his ability to perform ADL/self-care tasks. Pt would benefit from skilled OT services to address noted impairments and functional limitations (see below for any additional details) in order to maximize safety and independence while minimizing falls risk and caregiver burden. Upon hospital discharge, recommend STR to maximize pt safety and return to PLOF at this time. Will re-assess discharge recommendation pending additional progress while hospitalized.    Recommendations for follow up therapy are one component of a multi-disciplinary discharge planning process, led by the attending physician.  Recommendations may be updated based on patient status, additional functional criteria and insurance authorization.   Follow Up  Recommendations  Skilled nursing-short term rehab (<3 hours/day)    Assistance Recommended at Discharge Intermittent Supervision/Assistance  Patient can return home with the following A little help with walking and/or transfers;A little help with bathing/dressing/bathroom;Assistance with cooking/housework;Assist for transportation;Help with stairs or ramp for entrance    Functional Status Assessment  Patient has had a recent decline in their functional status and demonstrates the ability to make significant improvements in function in a reasonable and predictable amount of time.  Equipment Recommendations  BSC/3in1    Recommendations for Other Services       Precautions / Restrictions Precautions Precautions: Fall Restrictions Weight Bearing Restrictions: No      Mobility Bed Mobility               General bed mobility comments: NT, up in recliner at start and end of session    Transfers Overall transfer level: Needs assistance Equipment used: Rolling walker (2 wheels) Transfers: Sit to/from Stand Sit to Stand: Supervision           General transfer comment: SBA for ADL transfers +RW, negotiates obstacles from recliner to door and back with SBA and PRN VC for safety/RW mgt      Balance Overall balance assessment: Needs assistance Sitting-balance support: Feet supported Sitting balance-Leahy Scale: Good     Standing balance support: Bilateral upper extremity supported Standing balance-Leahy Scale: Fair                             ADL either performed or assessed with clinical judgement   ADL Overall ADL's : Needs assistance/impaired  General ADL Comments: Pt able to doff/don socks seated in recliner without direct assist. Requires CGA in standing to complete LB dressing over hips. Assist to reposition clip pt has on his pajama pants to make them tighter to minimize risk of falling off (too  big for pt).     Vision         Perception     Praxis      Pertinent Vitals/Pain Pain Assessment Pain Assessment: No/denies pain     Hand Dominance Right   Extremity/Trunk Assessment Upper Extremity Assessment Upper Extremity Assessment: Generalized weakness   Lower Extremity Assessment Lower Extremity Assessment: Generalized weakness   Cervical / Trunk Assessment Cervical / Trunk Assessment: Kyphotic   Communication Communication Communication: No difficulties   Cognition Arousal/Alertness: Awake/alert Behavior During Therapy: WFL for tasks assessed/performed Overall Cognitive Status: No family/caregiver present to determine baseline cognitive functioning                                 General Comments: alert and oriented, VC for safety/RW mgt during ADL mobility to minimize falls risk     General Comments       Exercises Other Exercises Other Exercises: Pt educated in falls prevention strategies, home/routines modifications   Shoulder Instructions      Home Living Family/patient expects to be discharged to:: Private residence Living Arrangements: Alone Available Help at Discharge: Available PRN/intermittently;Family Type of Home: House Home Access: Stairs to enter [employer] of Steps: 5 Entrance Stairs-Rails: Right;Left;Can reach both Home Layout: Able to live on main level with bedroom/bathroom;Two level Alternate Level Stairs-Number of Steps: 14 Alternate Level Stairs-Rails: Right Bathroom Shower/Tub: Teacher, early years/pre: Standard Bathroom Accessibility: Yes   Home Equipment: Grab bars - toilet;Cane - single point;Rolling Walker (2 wheels)   Additional Comments: Pt lives with son however, he is unable to care for patient due to medical complications.      Prior Functioning/Environment Prior Level of Function : Independent/Modified Independent             Mobility Comments: per pt up until recently  he was doing okay, but in the last week wasn't even able to make it to the mailbox while walking. able to drive and go to [employer] as needed ADLs Comments: Pt reports indep with basic ADL at baseline        OT Problem List: Decreased strength;Decreased activity tolerance;Impaired balance (sitting and/or standing);Decreased safety awareness;Decreased knowledge of use of DME or AE      OT Treatment/Interventions: Self-care/ADL training;Therapeutic exercise;Therapeutic activities;DME and/or AE instruction;Patient/family education;Balance training    OT Goals(Current goals can be found in the care plan section) Acute Rehab OT Goals Patient Stated Goal: get stronger OT Goal Formulation: With patient Time For Goal Achievement: 08/02/21 Potential to Achieve Goals: Good ADL Goals Pt Will Perform Lower Body Dressing: with modified independence;sit to/from stand Pt Will Transfer to Toilet: with modified independence;ambulating (elevated commode, LRAD) Additional ADL Goal #1: Pt will perform seated sponge bath with remote supervision for safety.  OT Frequency: Min 2X/week    Co-evaluation              AM-PAC OT "6 Clicks" Daily Activity     Outcome Measure Help from another person eating meals?: None Help from another person taking care of personal grooming?: A Little Help from another person toileting, which includes using toliet, bedpan, or urinal?: A Little Help from  another person bathing (including washing, rinsing, drying)?: A Little Help from another person to put on and taking off regular upper body clothing?: None Help from another person to put on and taking off regular lower body clothing?: A Little 6 Click Score: 20   End of Session    Activity Tolerance: Patient tolerated treatment well Patient left: in chair;with call bell/phone within reach;with chair alarm set  OT Visit Diagnosis: Other abnormalities of gait and mobility (R26.89);Muscle weakness (generalized)  (M62.81)                Time: 1202-1220 OT Time Calculation (min): 18 min Charges:  OT General Charges $OT Visit: 1 Visit OT Evaluation $OT Eval Moderate Complexity: 1 Mod  Ardeth Perfect., MPH, MS, OTR/L ascom [phone] 07/19/21, 1:19 PM

## 2021-07-19 NOTE — Progress Notes (Signed)
PROGRESS NOTE    Louis Reid  ZOX:[id number] DOB: Sep 19, 1932 DOA: 07/18/2021 PCP: [name], MD    Brief Narrative:   Louis Reid is a 88 y.o. male with medical history significant of coronary disease, heart failure ejection fraction 20%, paroxysmal atrial fibrillation, chronic kidney disease stage IIIb, BPH, hypertension, dyslipidemia, PE, malnutrition who presented with generalized weakness and poor appetite.  Multiple recent hospitalizations with similar symptoms and failure to thrive. He was able to live at home independently prior to admission, he was found to have a COVID at admission.  Assessment & Plan:   Principal Problem:   Failure to thrive in adult Active Problems:   Essential hypertension   Ischemic cardiomyopathy   Chronic systolic CHF (congestive heart failure) (HCC)   Protein-calorie malnutrition, severe   Paroxysmal atrial fibrillation (HCC)   Pressure injury of skin   COVID-19 virus infection  COVID virus infection. Failure to thrive. Generalized weakness secondary to COVID infection. Severe protein calorie malnutrition. Patient had a functional decline over the last 2 or 3 days, this is a presumably secondary to COVID infection.  He is currently receiving remdesivir. Continue PT/OT. Patient may be only able to go back home living independently in 1 to 2 days.  Patient to continue followed by palliative care  Start a protein supplement.  Chronic kidney disease stage IIIb Acute kidney injury ruled out. Reviewed the previous labs, patient renal function is not a much worsened than baseline.  To follow.  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  Ejection fraction 20 to 25%. Patient is no longer dehydrated.  We will restart 20 mg Lasix daily.  Paroxysmal atrial fibrillation. Continue Eliquis.  Chronic thrombocytopenia. Stable.  Essential hypertension.      DVT prophylaxis: Eliquis Code Status:  DNR Family Communication:  Disposition Plan:    Status is: Observation     I/O last 3 completed shifts: In: 670 [P.O.:420; IV Piggyback:250] Out: 125 [Urine:125] No intake/output data recorded.     Consultants:  None  Procedures: None  Antimicrobials: None  Subjective: Patient is still complaining of generalized weakness, he has a poor appetite but no nausea vomiting. He denies any short of breath, no hypoxia.  Objective: Vitals:   07/19/21 0043 07/19/21 0423 07/19/21 0424 07/19/21 0819  BP: 117/66 115/75  107/72  Pulse: 65 (!) 58  (!) 50  Resp: 16 16  16   Temp: (!) 97.5 F (36.4 C) 97.6 F (36.4 C)  97.7 F (36.5 C)  TempSrc: Oral Oral  Oral  SpO2: 98% 98%  100%  Weight:   54 kg   Height:        Intake/Output Summary (Last 24 hours) at 07/19/2021 1229 Last data filed at 07/19/2021 0600 Gross per 24 hour  Intake 670 ml  Output 125 ml  Net 545 ml   Filed Weights   07/18/21 1348 07/18/21 2041 07/19/21 0424  Weight: 59 kg 51.9 kg 54 kg    Examination:  General exam: Appears calm and comfortable, severe malnourished with severe muscle atrophy Respiratory system: Clear to auscultation. Respiratory effort normal. Cardiovascular system: S1 & S2 heard, RRR. No JVD, murmurs, rubs, gallops or clicks. No pedal edema. Gastrointestinal system: Abdomen is nondistended, soft and nontender. No organomegaly or masses felt. Normal bowel sounds heard. Central nervous system: Alert and oriented x3. No focal neurological deficits. Extremities: Symmetric 5 x 5 power. Skin: No rashes, lesions or ulcers Psychiatry: Judgement and insight appear normal. Mood & affect appropriate.  Data Reviewed: I have personally reviewed following labs and imaging studies  CBC: Recent Labs  Lab 07/18/21 1351  WBC 7.0  HGB 13.6  HCT 42.5  MCV 99.1  PLT 161*   Basic Metabolic Panel: Recent Labs  Lab 07/18/21 1351 07/19/21 0552  NA 141 143  K 3.8 3.8  CL 110 111  CO2 21*  25  GLUCOSE 170* 91  BUN 37* 35*  CREATININE 1.52* 1.28*  CALCIUM 9.2 8.8*   GFR: Estimated Creatinine Clearance: 31.6 mL/min (A) (by C-G formula based on SCr of 1.28 mg/dL (H)). Liver Function Tests: Recent Labs  Lab 07/18/21 1351  AST 42*  ALT 35  ALKPHOS 93  BILITOT 0.9  PROT 6.0*  ALBUMIN 3.5   No results for input(s): LIPASE, AMYLASE in the last 168 hours. No results for input(s): AMMONIA in the last 168 hours. Coagulation Profile: No results for input(s): INR, PROTIME in the last 168 hours. Cardiac Enzymes: No results for input(s): CKTOTAL, CKMB, CKMBINDEX, TROPONINI in the last 168 hours. BNP (last 3 results) No results for input(s): PROBNP in the last 8760 hours. HbA1C: No results for input(s): HGBA1C in the last 72 hours. CBG: No results for input(s): GLUCAP in the last 168 hours. Lipid Profile: No results for input(s): CHOL, HDL, LDLCALC, TRIG, CHOLHDL, LDLDIRECT in the last 72 hours. Thyroid Function Tests: Recent Labs    07/18/21 1351  TSH 5.639*  FREET4 1.01   Anemia Panel: No results for input(s): VITAMINB12, FOLATE, FERRITIN, TIBC, IRON, RETICCTPCT in the last 72 hours. Sepsis Labs: No results for input(s): PROCALCITON, LATICACIDVEN in the last 168 hours.  Recent Results (from the past 240 hour(s))  Resp Panel by RT-PCR (Flu A&B, Covid) Nasopharyngeal Swab     Status: Abnormal   Collection Time: 07/18/21  3:51 PM   Specimen: Nasopharyngeal Swab; Nasopharyngeal(NP) swabs in vial transport medium  Result Value Ref Range Status   SARS Coronavirus 2 by RT PCR POSITIVE (A) NEGATIVE Final    Comment: (NOTE) SARS-CoV-2 target nucleic acids are DETECTED.  The SARS-CoV-2 RNA is generally detectable in upper respiratory specimens during the acute phase of infection. Positive results are indicative of the presence of the identified virus, but do not rule out bacterial infection or co-infection with other pathogens not detected by the test. Clinical  correlation with patient history and other diagnostic information is necessary to determine patient infection status. The expected result is Negative.  Fact Sheet for Patients: EntrepreneurPulse.com.au  Fact Sheet for Healthcare Providers: IncredibleEmployment.be  This test is not yet approved or cleared by the [place] FDA and  has been authorized for detection and/or diagnosis of SARS-CoV-2 by FDA under an Emergency Use Authorization (EUA).  This EUA will remain in effect (meaning this test can be used) for the duration of  the COVID-19 declaration under Section 564(b)(1) of the A ct, 21 U.S.C. section 360bbb-3(b)(1), unless the authorization is terminated or revoked sooner.     Influenza A by PCR NEGATIVE NEGATIVE Final   Influenza B by PCR NEGATIVE NEGATIVE Final    Comment: (NOTE) The Xpert Xpress SARS-CoV-2/FLU/RSV plus assay is intended as an aid in the diagnosis of influenza from Nasopharyngeal swab specimens and should not be used as a sole basis for treatment. Nasal washings and aspirates are unacceptable for Xpert Xpress SARS-CoV-2/FLU/RSV testing.  Fact Sheet for Patients: EntrepreneurPulse.com.au  Fact Sheet for Healthcare Providers: IncredibleEmployment.be  This test is not yet approved or cleared by the [place] FDA and has been  authorized for detection and/or diagnosis of SARS-CoV-2 by FDA under an Emergency Use Authorization (EUA). This EUA will remain in effect (meaning this test can be used) for the duration of the COVID-19 declaration under Section 564(b)(1) of the Act, 21 U.S.C. section 360bbb-3(b)(1), unless the authorization is terminated or revoked.  Performed at [facility], [address] [place], [place] [address]          Radiology Studies: DG Chest 2 View  Result Date: 07/18/2021 CLINICAL DATA:  Shortness of breath EXAM: CHEST - 2 VIEW  COMPARISON:  02/18/2021 FINDINGS: Left-sided implanted cardiac device. Post CABG changes. Stable mild cardiomegaly. Hyperinflated lungs. Trace bilateral pleural effusions. No pneumothorax. IMPRESSION: Trace bilateral pleural effusions. Electronically Signed   By: Davina Poke D.O.   On: 07/18/2021 16:19   CT HEAD WO CONTRAST (5MM)  Result Date: 07/18/2021 CLINICAL DATA:  Altered mental status EXAM: CT HEAD WITHOUT CONTRAST TECHNIQUE: Contiguous axial images were obtained from the base of the skull through the vertex without intravenous contrast. RADIATION DOSE REDUCTION: This exam was performed according to the departmental dose-optimization program which includes automated exposure control, adjustment of the mA and/or kV according to patient size and/or use of iterative reconstruction technique. COMPARISON:  05/12/2012 FINDINGS: Brain: No acute intracranial findings are seen. Cortical sulci are prominent. There is decreased density in the periventricular white matter. There are no epidural or subdural fluid collections. Vascular: Scattered arterial calcifications are seen. Skull: Unremarkable. Sinuses/Orbits: Unremarkable. Other: None IMPRESSION: No acute intracranial findings are seen in noncontrast CT brain. Atrophy. Small-vessel disease. Electronically Signed   By: Elmer Picker M.D.   On: 07/18/2021 17:00        Scheduled Meds:  apixaban  2.5 mg Oral BID   cholecalciferol  1,000 Units Oral Daily   feeding supplement  237 mL Oral TID BM   ferrous sulfate  325 mg Oral Q breakfast   finasteride  5 mg Oral Daily   mirtazapine  15 mg Oral QHS   rosuvastatin  20 mg Oral QHS   senna  1 tablet Oral BID   Continuous Infusions:  remdesivir 100 mg in NS 100 mL 100 mg (07/19/21 1000)     LOS: 0 days    Time spent: 28 minutes    Sharen Hones, MD [facility]   To contact the attending provider between 7A-7P or the covering provider during after hours 7P-7A, please log into  the web site [email or web address] and access using universal [place] password for that web site. If you do not have the password, please call the hospital operator.  07/19/2021, 12:29 PM

## 2021-07-19 NOTE — Evaluation (Signed)
Physical Therapy Evaluation Patient Details Name: Louis Reid MRN: [id number] DOB: 12/23/1932 Today's Date: 07/19/2021  History of Present Illness  The patient is an 88 yo male that presented to the ED for weakness, poor appetite, inability to care for himself. PMH of recent hospital admissions, coronary disease, heart failure ejection fraction 20%, paroxysmal atrial fibrillation, chronic kidney disease stage IIIb, BPH, hypertension, dyslipidemia, PE, was on hospice but transitioned to palliative, MI, AICD.   Clinical Impression  The patient was alert, oriented x4, reported a decline in mobility in the last week or so. Stated he lives at home alone, does have a son that could come PRN for a day or two, but lives  out of town. Reported prior to feeling weaker, modI/I for ADLs, IADLs (pt able to drive to [employer] for groceries). Stated he has a [place] he would use for community ambulation as needed.  The patient was able to perform supine to sit with supervision. Good sitting balance noted, able to sit for a few minutes with supervision as well. Sit <> stand with handheld assist, CGA, and was able to transfer to recliner with step pivot. Declined further ambulation at this time due to fatigue.  Overall the patient demonstrated deficits (see "PT Problem List") that impede the patient's functional abilities, safety, and mobility and would benefit from skilled PT intervention. Recommendation at this time is SNF due to current deconditioning to maximize safety, independence, and function.        Recommendations for follow up therapy are one component of a multi-disciplinary discharge planning process, led by the attending physician.  Recommendations may be updated based on patient status, additional functional criteria and insurance authorization.  Follow Up Recommendations Skilled nursing-short term rehab (<3 hours/day)    Assistance Recommended at Discharge    Patient can return home with the  following  A little help with bathing/dressing/bathroom;A little help with walking and/or transfers;Assistance with cooking/housework;Direct supervision/assist for financial management;Assist for transportation;Direct supervision/assist for medications management;Help with stairs or ramp for entrance    Equipment Recommendations None recommended by PT  Recommendations for Other Services       Functional Status Assessment Patient has had a recent decline in their functional status and demonstrates the ability to make significant improvements in function in a reasonable and predictable amount of time.     Precautions / Restrictions Precautions Precautions: Fall Restrictions Weight Bearing Restrictions: No      Mobility  Bed Mobility Overal bed mobility: Needs Assistance Bed Mobility: Supine to Sit     Supine to sit: Supervision, HOB elevated          Transfers Overall transfer level: Needs assistance Equipment used: 1 person hand held assist Transfers: Sit to/from Stand, Bed to chair/wheelchair/BSC Sit to Stand: Min guard   Step pivot transfers: Supervision       General transfer comment: pt reported fatigue after transfer declined further mobility    Ambulation/Gait                  Stairs            Wheelchair Mobility    Modified Rankin (Stroke Patients Only)       Balance Overall balance assessment: Needs assistance Sitting-balance support: Feet supported Sitting balance-Leahy Scale: Good     Standing balance support: Single extremity supported Standing balance-Leahy Scale: Fair  Pertinent Vitals/Pain Pain Assessment Pain Assessment: No/denies pain    Home Living Family/patient expects to be discharged to:: Private residence Living Arrangements: Alone Available Help at Discharge: Available PRN/intermittently;Family Type of Home: House Home Access: Stairs to enter Entrance Stairs-Rails:  Right;Left;Can reach both Entrance Stairs-Number of Steps: 5 Alternate Level Stairs-Number of Steps: 14 Home Layout: Able to live on main level with bedroom/bathroom;Two level Home Equipment: Grab bars - toilet;Cane - single point;Rolling Walker (2 wheels) Additional Comments: Pt lives with son however, he is unable to care for patient due to medical complications.    Prior Function Prior Level of Function : Independent/Modified Independent             Mobility Comments: per pt up until recently he was doing okay, but in the last week wasn't even able to make it to the mailbox while walking. able to drive and go to [employer] as needed       Hand Dominance   Dominant Hand: Right    Extremity/Trunk Assessment   Upper Extremity Assessment Upper Extremity Assessment: Generalized weakness    Lower Extremity Assessment Lower Extremity Assessment: Generalized weakness    Cervical / Trunk Assessment Cervical / Trunk Assessment: Kyphotic  Communication   Communication: No difficulties  Cognition Arousal/Alertness: Awake/alert Behavior During Therapy: WFL for tasks assessed/performed Overall Cognitive Status: Within Functional Limits for tasks assessed                                          General Comments      Exercises     Assessment/Plan    PT Assessment Patient needs continued PT services  PT Problem List Decreased strength;Decreased mobility;Decreased range of motion;Decreased activity tolerance;Decreased balance;Decreased knowledge of use of DME       PT Treatment Interventions DME instruction;Therapeutic exercise;Gait training;Balance training;Stair training;Neuromuscular re-education;Functional mobility training;Therapeutic activities;Patient/family education    PT Goals (Current goals can be found in the Care Plan section)  Acute Rehab PT Goals Patient Stated Goal: to go home PT Goal Formulation: With patient Time For Goal  Achievement: 08/02/21 Potential to Achieve Goals: Good    Frequency Min 2X/week     Co-evaluation               AM-PAC PT "6 Clicks" Mobility  Outcome Measure Help needed turning from your back to your side while in a flat bed without using bedrails?: A Little Help needed moving from lying on your back to sitting on the side of a flat bed without using bedrails?: A Little Help needed moving to and from a bed to a chair (including a wheelchair)?: A Little Help needed standing up from a chair using your arms (e.g., wheelchair or bedside chair)?: A Little Help needed to walk in hospital room?: A Little Help needed climbing 3-5 steps with a railing? : A Little 6 Click Score: 18    End of Session   Activity Tolerance: Patient tolerated treatment well;Patient limited by fatigue Patient left: in chair;with chair alarm set;with call bell/phone within reach Nurse Communication: Mobility status PT Visit Diagnosis: Other abnormalities of gait and mobility (R26.89);Difficulty in walking, not elsewhere classified (R26.2);Muscle weakness (generalized) (M62.81)    Time: 1040-1107 PT Time Calculation (min) (ACUTE ONLY): 27 min   Charges:   PT Evaluation $PT Eval Low Complexity: 1 Low PT Treatments $Therapeutic Activity: 23-37 mins  Lieutenant Diego PT, DPT 12:56 PM,07/19/21

## 2021-07-19 NOTE — Progress Notes (Signed)
Initial Nutrition Assessment RD working remotely.   DOCUMENTATION CODES:   Underweight  INTERVENTION:  - will order 1 packet Juven BID, each packet provides 95 calories, 2.5 grams of protein (collagen), and 9.8 grams of carbohydrate (3 grams sugar); also contains 7 grams of L-arginine and L-glutamine, 300 mg vitamin C, 15 mg vitamin E, 1.2 mcg vitamin B-12, 9.5 mg zinc, 200 mg calcium, and 1.5 g  Calcium Beta-hydroxy-Beta-methylbutyrate to support wound healing. - continue Ensure Plus High Protein TID, each supplement provides 350 kcal and 20 grams of protein. - will order multivitamin with minerals daily - complete NFPE when feasible.    NUTRITION DIAGNOSIS:   Increased nutrient needs related to acute illness, catabolic illness (COVID infection) as evidenced by estimated needs.  GOAL:   Patient will meet greater than or equal to 90% of their needs  MONITOR:   PO intake, Supplement acceptance, Labs, Weight trends, Skin  REASON FOR ASSESSMENT:   Consult Assessment of nutrition requirement/status  ASSESSMENT:   88 y.o. male with medical history of CAD, heart failure ejection fraction 20%, afib, stage 3 CKD, BPH, HTN, dyslipidemia, PE, and malnutrition. He presented to the ED due to generalized weakness and poor appetite. He has had multiple recent hospitalizations for the same. In the ED he was found to be COVID positive.  Patient was last assessed by a [place] RD on 04/04/21. At that time, patient was out of his room and RD was unable to complete a NFPE. She attempted to call patient but was unable to reach him. Patient unable to be reach during attempt this afternoon.   No meal completion percentages documented in the chart.   Weight today is 119 lb and weight has fluctuated often over the past 1 year. Non-pitting edema to BLE documented in the edema section of flow sheet.   Suspect patient meets criteria for malnutrition given frequent hospitalizations and BMI of 16.6,  but unable to confirm at this time.   MD note states that patient reported persistent poor appetite, no N/V. Patient dx with end-stage CHF. Palliative Care following.  He is currently Observation status.    Labs reviewed; creatinine: 1.28 mg/dl, GFR: 55 ml/min.   Medications reviewed; 1000 units cholecalciferol/day, 325 mg ferrous sulfate/day, 10 mg oral lasix/day, 200 mg IV remdesivir x1 dose 2/10, 100 mg IV remdesivir x1 dose/day (2/11-2/14), 1 tablet senokot BID.    NUTRITION - FOCUSED PHYSICAL EXAM:  RD working remotely.  Diet Order:   Diet Order             Diet Heart Room service appropriate? Yes; Fluid consistency: Thin  Diet effective now                   EDUCATION NEEDS:   Not appropriate for education at this time  Skin:  Skin Assessment: Skin Integrity Issues: Skin Integrity Issues:: Stage II Stage II: sacrum  Last BM:  PTA/unknown  Height:   Ht Readings from Last 1 Encounters:  07/18/21 5\' 11"  (1.803 m)    Weight:   Wt Readings from Last 1 Encounters:  07/19/21 54 kg     BMI:  Body mass index is 16.6 kg/m.   Estimated Nutritional Needs:  Kcal:  1800-2000 kcal Protein:  90-105 grams Fluid:  >/= 2.2 L/day      Jarome Matin, MS, RD, LDN Inpatient Clinical Dietitian RD pager # available in [place]  After hours/weekend pager # available in [facility]

## 2021-07-20 DIAGNOSIS — I255 Ischemic cardiomyopathy: Secondary | ICD-10-CM | POA: Diagnosis present

## 2021-07-20 DIAGNOSIS — E860 Dehydration: Secondary | ICD-10-CM | POA: Diagnosis present

## 2021-07-20 DIAGNOSIS — Z951 Presence of aortocoronary bypass graft: Secondary | ICD-10-CM | POA: Diagnosis not present

## 2021-07-20 DIAGNOSIS — I252 Old myocardial infarction: Secondary | ICD-10-CM | POA: Diagnosis not present

## 2021-07-20 DIAGNOSIS — U071 COVID-19: Secondary | ICD-10-CM | POA: Diagnosis present

## 2021-07-20 DIAGNOSIS — L89152 Pressure ulcer of sacral region, stage 2: Secondary | ICD-10-CM | POA: Diagnosis present

## 2021-07-20 DIAGNOSIS — Z95810 Presence of automatic (implantable) cardiac defibrillator: Secondary | ICD-10-CM | POA: Diagnosis not present

## 2021-07-20 DIAGNOSIS — Z85828 Personal history of other malignant neoplasm of skin: Secondary | ICD-10-CM | POA: Diagnosis not present

## 2021-07-20 DIAGNOSIS — R627 Adult failure to thrive: Secondary | ICD-10-CM | POA: Diagnosis present

## 2021-07-20 DIAGNOSIS — Z66 Do not resuscitate: Secondary | ICD-10-CM | POA: Diagnosis present

## 2021-07-20 DIAGNOSIS — I5022 Chronic systolic (congestive) heart failure: Secondary | ICD-10-CM | POA: Diagnosis present

## 2021-07-20 DIAGNOSIS — Z86711 Personal history of pulmonary embolism: Secondary | ICD-10-CM | POA: Diagnosis not present

## 2021-07-20 DIAGNOSIS — I130 Hypertensive heart and chronic kidney disease with heart failure and stage 1 through stage 4 chronic kidney disease, or unspecified chronic kidney disease: Secondary | ICD-10-CM | POA: Diagnosis present

## 2021-07-20 DIAGNOSIS — M810 Age-related osteoporosis without current pathological fracture: Secondary | ICD-10-CM | POA: Diagnosis present

## 2021-07-20 DIAGNOSIS — E43 Unspecified severe protein-calorie malnutrition: Secondary | ICD-10-CM | POA: Diagnosis present

## 2021-07-20 DIAGNOSIS — I2510 Atherosclerotic heart disease of native coronary artery without angina pectoris: Secondary | ICD-10-CM | POA: Diagnosis present

## 2021-07-20 DIAGNOSIS — N400 Enlarged prostate without lower urinary tract symptoms: Secondary | ICD-10-CM | POA: Diagnosis present

## 2021-07-20 DIAGNOSIS — Z681 Body mass index (BMI) 19 or less, adult: Secondary | ICD-10-CM | POA: Diagnosis not present

## 2021-07-20 DIAGNOSIS — R531 Weakness: Secondary | ICD-10-CM | POA: Diagnosis present

## 2021-07-20 DIAGNOSIS — I480 Paroxysmal atrial fibrillation: Secondary | ICD-10-CM | POA: Diagnosis present

## 2021-07-20 DIAGNOSIS — Z7189 Other specified counseling: Secondary | ICD-10-CM | POA: Diagnosis not present

## 2021-07-20 DIAGNOSIS — Z515 Encounter for palliative care: Secondary | ICD-10-CM | POA: Diagnosis not present

## 2021-07-20 DIAGNOSIS — E785 Hyperlipidemia, unspecified: Secondary | ICD-10-CM | POA: Diagnosis present

## 2021-07-20 DIAGNOSIS — N179 Acute kidney failure, unspecified: Secondary | ICD-10-CM | POA: Diagnosis present

## 2021-07-20 DIAGNOSIS — N1832 Chronic kidney disease, stage 3b: Secondary | ICD-10-CM | POA: Diagnosis present

## 2021-07-20 DIAGNOSIS — D696 Thrombocytopenia, unspecified: Secondary | ICD-10-CM | POA: Diagnosis present

## 2021-07-20 MED ORDER — SENNOSIDES-DOCUSATE SODIUM 8.6-50 MG PO TABS
2.0000 | ORAL_TABLET | Freq: Two times a day (BID) | ORAL | Status: DC
  Administered 2021-07-20 – 2021-07-25 (×8): 2 via ORAL
  Filled 2021-07-20 (×10): qty 2

## 2021-07-20 MED ORDER — SODIUM CHLORIDE 0.9 % IV SOLN: INTRAVENOUS | Status: DC | PRN

## 2021-07-20 MED ORDER — POLYETHYLENE GLYCOL 3350 17 G PO PACK
17.0000 g | PACK | Freq: Every day | ORAL | Status: DC
  Administered 2021-07-20: 11:00:00 17 g via ORAL
  Filled 2021-07-20 (×6): qty 1

## 2021-07-20 NOTE — Progress Notes (Signed)
COMMUNITY PALLIATIVE CARE SW NOTE  PATIENT NAME: Louis Reid DOB: 19-Aug-1932 MRN: [id number]  PRIMARY CARE PROVIDER: [name], MD  RESPONSIBLE PARTY:  Acct ID - Guarantor Home Phone Work Phone Relationship Acct Type  [id number] [name], [name] [phone]  Self P/F     [address], [place], [place] [id number]   Due to the COVID-19 crisis, this virtual check-in visit was done via telephone from my office and it was initiated and consent by this patient and or family  [place] (4:19 pm-4:30 pm)  PC SW completed a telephonic visit with patient to provide education and support to him as he is interested in going to a LTC facility. Patient explained that he knows he will need more care. He is only interested in going to [facility] and wants SW to call to find out if they have a bed available. SW provided education on the process for LTC/SNF placement to include FL2 and finances. Patient stated that he wanted to talk with his son, who he was expecting to come over later about placement. SW provided him her contact information and advised him to call if he decide to move forward with placement and SW could assist him.  No other concerns noted.    [address] [place], [place]

## 2021-07-20 NOTE — Progress Notes (Signed)
PROGRESS NOTE    Louis Reid  ION:[id number] DOB: 1933-03-01 DOA: 07/18/2021 PCP: [name], MD    Brief Narrative:  CHRLES SELLEY is a 88 y.o. male with medical history significant of coronary disease, heart failure ejection fraction 20%, paroxysmal atrial fibrillation, chronic kidney disease stage IIIb, BPH, hypertension, dyslipidemia, PE, malnutrition who presented with generalized weakness and poor appetite.  Multiple recent hospitalizations with similar symptoms and failure to thrive. He was able to live at home independently prior to admission, he was found to have a COVID at admission.   Assessment & Plan:   Principal Problem:   Failure to thrive in adult Active Problems:   Essential hypertension   Ischemic cardiomyopathy   Chronic systolic CHF (congestive heart failure) (HCC)   Protein-calorie malnutrition, severe   Paroxysmal atrial fibrillation (HCC)   Pressure injury of skin   COVID-19 virus infection   COVID virus infection. Failure to thrive. Generalized weakness secondary to COVID infection. Severe protein calorie malnutrition. Patient had a functional decline over the last 2 or 3 days, this is a presumably secondary to COVID infection.  He is currently receiving remdesivir. Patient is evaluated by PT/OT, recommended nursing home placement.  However, patient preferred to go home.  We will continue treating COVID, reassess on Monday if patient can be discharged home.  Chronic kidney disease stage IIIb Acute kidney injury ruled out. Conditions are stable.   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  Ejection fraction 20 to 25%. Continue lower dose Lasix  Paroxysmal atrial fibrillation. Continue Eliquis.   Chronic thrombocytopenia. Stable.  Essential hypertension.           DVT prophylaxis: Eliquis Code Status: DNR Family Communication:  Disposition Plan:      Status is: Observation      I/O last 3  completed shifts: In: 670 [P.O.:420; IV Piggyback:250] Out: 750 [Urine:750] No intake/output data recorded.    Subjective: Patient still complaining of significant weakness.  He is also constipated, started scheduled senna and MiraLAX. No abdominal pain or nausea vomiting. No fever or chills. He does not have any short of breath.  Objective: Vitals:   07/19/21 2038 07/20/21 0331 07/20/21 0445 07/20/21 0900  BP: 107/64 110/70  119/70  Pulse: 71 60  80  Resp: 16 18  16   Temp: 98 F (36.7 C) 98 F (36.7 C)  98.4 F (36.9 C)  TempSrc: Oral Oral    SpO2: 98% 98%  99%  Weight:   52 kg   Height:        Intake/Output Summary (Last 24 hours) at 07/20/2021 1122 Last data filed at 07/20/2021 0444 Gross per 24 hour  Intake --  Output 625 ml  Net -625 ml   Filed Weights   07/18/21 2041 07/19/21 0424 07/20/21 0445  Weight: 51.9 kg 54 kg 52 kg    Examination:  General exam: Appears calm and comfortable  Respiratory system: Clear to auscultation. Respiratory effort normal. Cardiovascular system: S1 & S2 heard, RRR. No JVD, murmurs, rubs, gallops or clicks. No pedal edema. Gastrointestinal system: Abdomen is nondistended, soft and nontender. No organomegaly or masses felt. Normal bowel sounds heard. Central nervous system: Alert and oriented. No focal neurological deficits. Extremities: Symmetric 5 x 5 power. Skin: No rashes, lesions or ulcers Psychiatry: Judgement and insight appear normal. Mood & affect appropriate.     Data Reviewed: I have personally reviewed following labs and imaging studies  CBC: Recent Labs  Lab 07/18/21 1351  WBC 7.0  HGB 13.6  HCT 42.5  MCV 99.1  PLT 621*   Basic Metabolic Panel: Recent Labs  Lab 07/18/21 1351 07/19/21 0552  NA 141 143  K 3.8 3.8  CL 110 111  CO2 21* 25  GLUCOSE 170* 91  BUN 37* 35*  CREATININE 1.52* 1.28*  CALCIUM 9.2 8.8*   GFR: Estimated Creatinine Clearance: 30.5 mL/min (A) (by C-G formula based on SCr of  1.28 mg/dL (H)). Liver Function Tests: Recent Labs  Lab 07/18/21 1351  AST 42*  ALT 35  ALKPHOS 93  BILITOT 0.9  PROT 6.0*  ALBUMIN 3.5   No results for input(s): LIPASE, AMYLASE in the last 168 hours. No results for input(s): AMMONIA in the last 168 hours. Coagulation Profile: No results for input(s): INR, PROTIME in the last 168 hours. Cardiac Enzymes: No results for input(s): CKTOTAL, CKMB, CKMBINDEX, TROPONINI in the last 168 hours. BNP (last 3 results) No results for input(s): PROBNP in the last 8760 hours. HbA1C: No results for input(s): HGBA1C in the last 72 hours. CBG: No results for input(s): GLUCAP in the last 168 hours. Lipid Profile: No results for input(s): CHOL, HDL, LDLCALC, TRIG, CHOLHDL, LDLDIRECT in the last 72 hours. Thyroid Function Tests: Recent Labs    07/18/21 1351  TSH 5.639*  FREET4 1.01   Anemia Panel: No results for input(s): VITAMINB12, FOLATE, FERRITIN, TIBC, IRON, RETICCTPCT in the last 72 hours. Sepsis Labs: No results for input(s): PROCALCITON, LATICACIDVEN in the last 168 hours.  Recent Results (from the past 240 hour(s))  Resp Panel by RT-PCR (Flu A&B, Covid) Nasopharyngeal Swab     Status: Abnormal   Collection Time: 07/18/21  3:51 PM   Specimen: Nasopharyngeal Swab; Nasopharyngeal(NP) swabs in vial transport medium  Result Value Ref Range Status   SARS Coronavirus 2 by RT PCR POSITIVE (A) NEGATIVE Final    Comment: (NOTE) SARS-CoV-2 target nucleic acids are DETECTED.  The SARS-CoV-2 RNA is generally detectable in upper respiratory specimens during the acute phase of infection. Positive results are indicative of the presence of the identified virus, but do not rule out bacterial infection or co-infection with other pathogens not detected by the test. Clinical correlation with patient history and other diagnostic information is necessary to determine patient infection status. The expected result is Negative.  Fact Sheet for  Patients: EntrepreneurPulse.com.au  Fact Sheet for Healthcare Providers: IncredibleEmployment.be  This test is not yet approved or cleared by the [place] FDA and  has been authorized for detection and/or diagnosis of SARS-CoV-2 by FDA under an Emergency Use Authorization (EUA).  This EUA will remain in effect (meaning this test can be used) for the duration of  the COVID-19 declaration under Section 564(b)(1) of the A ct, 21 U.S.C. section 360bbb-3(b)(1), unless the authorization is terminated or revoked sooner.     Influenza A by PCR NEGATIVE NEGATIVE Final   Influenza B by PCR NEGATIVE NEGATIVE Final    Comment: (NOTE) The Xpert Xpress SARS-CoV-2/FLU/RSV plus assay is intended as an aid in the diagnosis of influenza from Nasopharyngeal swab specimens and should not be used as a sole basis for treatment. Nasal washings and aspirates are unacceptable for Xpert Xpress SARS-CoV-2/FLU/RSV testing.  Fact Sheet for Patients: EntrepreneurPulse.com.au  Fact Sheet for Healthcare Providers: IncredibleEmployment.be  This test is not yet approved or cleared by the [place] FDA and has been authorized for detection and/or diagnosis of SARS-CoV-2 by FDA under an Emergency Use Authorization (EUA). This EUA will remain in  effect (meaning this test can be used) for the duration of the COVID-19 declaration under Section 564(b)(1) of the Act, 21 U.S.C. section 360bbb-3(b)(1), unless the authorization is terminated or revoked.  Performed at [facility], [address] [place], [place] [address]          Radiology Studies: DG Chest 2 View  Result Date: 07/18/2021 CLINICAL DATA:  Shortness of breath EXAM: CHEST - 2 VIEW COMPARISON:  02/18/2021 FINDINGS: Left-sided implanted cardiac device. Post CABG changes. Stable mild cardiomegaly. Hyperinflated lungs. Trace bilateral pleural effusions. No  pneumothorax. IMPRESSION: Trace bilateral pleural effusions. Electronically Signed   By: Davina Poke D.O.   On: 07/18/2021 16:19   CT HEAD WO CONTRAST (5MM)  Result Date: 07/18/2021 CLINICAL DATA:  Altered mental status EXAM: CT HEAD WITHOUT CONTRAST TECHNIQUE: Contiguous axial images were obtained from the base of the skull through the vertex without intravenous contrast. RADIATION DOSE REDUCTION: This exam was performed according to the departmental dose-optimization program which includes automated exposure control, adjustment of the mA and/or kV according to patient size and/or use of iterative reconstruction technique. COMPARISON:  05/12/2012 FINDINGS: Brain: No acute intracranial findings are seen. Cortical sulci are prominent. There is decreased density in the periventricular white matter. There are no epidural or subdural fluid collections. Vascular: Scattered arterial calcifications are seen. Skull: Unremarkable. Sinuses/Orbits: Unremarkable. Other: None IMPRESSION: No acute intracranial findings are seen in noncontrast CT brain. Atrophy. Small-vessel disease. Electronically Signed   By: Elmer Picker M.D.   On: 07/18/2021 17:00        Scheduled Meds:  apixaban  2.5 mg Oral BID   cholecalciferol  1,000 Units Oral Daily   feeding supplement  237 mL Oral TID BM   ferrous sulfate  325 mg Oral Q breakfast   finasteride  5 mg Oral Daily   furosemide  10 mg Oral Daily   mirtazapine  15 mg Oral QHS   multivitamin with minerals  1 tablet Oral Daily   nutrition supplement (JUVEN)  1 packet Oral BID BM   polyethylene glycol  17 g Oral Daily   rosuvastatin  20 mg Oral QHS   senna-docusate  2 tablet Oral BID   Continuous Infusions:  sodium chloride 10 mL/hr at 07/20/21 1035   remdesivir 100 mg in NS 100 mL 100 mg (07/20/21 1037)     LOS: 0 days    Time spent: 28 minutes    Sharen Hones, MD [facility]   To contact the attending provider between 7A-7P or the  covering provider during after hours 7P-7A, please log into the web site [email or web address] and access using universal [place] password for that web site. If you do not have the password, please call the hospital operator.  07/20/2021, 11:22 AM

## 2021-07-20 NOTE — TOC Initial Note (Addendum)
Transition of Care [facility]) - Initial/Assessment Note    Patient Details  Name: Louis Reid MRN: [id number] Date of Birth: [birth year]  Transition of Care [facility]) CM/SW Contact:    Magnus Ivan, LCSW Phone Number: 07/20/2021, 10:02 AM  Clinical Narrative:                 Spoke with patient via phone due to airborne isolation.  Patient lives alone. Son Louis Reid lives in [place], [place] but is involved and supportive. Louis Reid provides transportation. Son could stay with patient 1-2 days at home if needed per patient. PCP is Dr. Arelia Sneddon. Pharmacy is [facility]. Patient has a cane which he only uses in the community. Patient went to [facility] in the past, and does not want to go back.  Patient stated he was on hospice services, but had been switched to Palliative care. He thinks it was through [facility], but is not sure. CSW confirmed with Santiago Glad with [facility] that patient is active with them as a PALLIATIVE patient now.  CSW explained SNF rec. Patient stated he is not sure that he wants to go to rehab. Patient also refuses HH at this time. Patient stated he wants to see how he does before determining his DC plan. He understands that he would not be able to go to rehab until 10 days after his positive COVID test (2/10).  Patient stated TOC can also contact his son Louis Reid about DC planning as needed. Per rounds PT to reeval tomorrow, TOC to follow.  Expected Discharge Plan: Skilled Nursing Facility Barriers to Discharge: Continued Medical Work up   Patient Goals and CMS Choice Patient states their goals for this hospitalization and ongoing recovery are:: still deciding on what he wants CMS Medicare.gov Compare Post Acute Care list provided to:: Patient Choice offered to / list presented to : Patient  Expected Discharge Plan and Services Expected Discharge Plan: [place]       Living arrangements for the past 2 months: Single Family Home                                       Prior Living Arrangements/Services Living arrangements for the past 2 months: Single Family Home Lives with:: Self Patient language and need for interpreter reviewed:: Yes Do you feel safe going back to the place where you live?: Yes      Need for Family Participation in Patient Care: Yes (Comment) Care giver support system in place?: Yes (comment) Current home services: Hospice, DME Criminal Activity/Legal Involvement Pertinent to Current Situation/Hospitalization: No - Comment as needed  Activities of Daily Living Home Assistive Devices/Equipment: Cane (specify quad or straight) ADL Screening (condition at time of admission) Patient's cognitive ability adequate to safely complete daily activities?: Yes Is the patient deaf or have difficulty hearing?: No Does the patient have difficulty seeing, even when wearing glasses/contacts?: No Does the patient have difficulty concentrating, remembering, or making decisions?: Yes Patient able to express need for assistance with ADLs?: Yes Does the patient have difficulty dressing or bathing?: No Independently performs ADLs?: Yes (appropriate for developmental age) Does the patient have difficulty walking or climbing stairs?: No Weakness of Legs: Both Weakness of Arms/Hands: None  Permission Sought/Granted Permission sought to share information with : Facility Sport and exercise psychologist, Family Supports Permission granted to share information with : Yes, Verbal Permission Granted  Share Information with NAME:  Louis Reid - son  Permission granted to share info w AGENCY: Hospice, SNF        Emotional Assessment       Orientation: : Oriented to Self, Oriented to Place, Oriented to  Time, Oriented to Situation Alcohol / Substance Use: Not Applicable Psych Involvement: No (comment)  Admission diagnosis:  Weakness [R53.1] Failure to thrive in adult [R62.7] AKI (acute kidney injury) ([place]) [N17.9] COVID  [U07.1] Patient Active Problem List   Diagnosis Date Noted   COVID-19 virus infection 07/19/2021   Pressure injury of skin 04/04/2021   AKI (acute kidney injury) ([place]) 04/02/2021   Splenic infarct    Hepatic steatosis    Stage 3b chronic kidney disease ([place]) 03/11/2021   Unable to care for self 03/11/2021   Constipation 03/11/2021   Failure to thrive in adult 03/11/2021   Protein-calorie malnutrition, severe 02/22/2021   Generalized weakness    Palliative care encounter    Goals of care, counseling/discussion    Encounter for hospice care discussion    Heart failure ([place]) 02/28/2018   Decreased cardiac ejection fraction 01/10/2018   Nonspecific chest pain    Chronic systolic CHF (congestive heart failure) ([place])    Unstable angina ([place]):  (01/20/2017) a. 3-v CAD w/ patent SVG to diag and patent sequential SVG to OM 2 and OM 3. Patent prox LAD stent. LIMA to LAD and SVG to RCA known occl. stable prox RCA sten  01/19/2017   Paroxysmal atrial fibrillation ([place]) 03/17/2016   Claudication ([place]) 01/22/2014   Chronic kidney disease 12/26/2013   ICD (implantable cardioverter-defibrillator) in place 02/16/2013   Ischemic cardiomyopathy 09/09/2012   Postop check 07/05/2012   STEMI (ST elevation myocardial infarction) ([place]) 05/18/2012   Anemia in chronic kidney disease(285.21) 05/17/2012   PE (pulmonary embolism) 05/17/2012   Physical deconditioning 05/17/2012   Anorexia 05/17/2012   Cardiogenic shock ([place]) 05/16/2012   Arterial hypotension 05/16/2012   Ventricular fibrillation arrest 05/10/2012   Cardiac arrest ([place]) 05/10/2012   Acute respiratory failure with hypoxia 2/2 cardiac arrest 05/10/2012   Acute encephalopathy 2/2 cardiac arrest 05/10/2012   Hyperglycemia 05/10/2012   Odynophagia 05/07/2012   Coronary artery disease 07/09/2011   History of coronary artery bypass surgery 07/09/2011   DYSPNEA 06/04/2010   HLD (hyperlipidemia) 04/17/2009   Essential hypertension 04/17/2009    Asymptomatic old MI (myocardial infarction) 04/17/2009   TOBACCO USE, QUIT 04/17/2009   PCP:  [name], MD Pharmacy:   [id number] [address], [place] [address] Phone: [phone] Fax: [phone]  [facility] ([place], [facility] [address] [place] [address] Phone: [phone] Fax: [phone]     Social Determinants of Health (SDOH) Interventions    Readmission Risk Interventions No flowsheet data found.

## 2021-07-20 NOTE — Care Management Obs Status (Signed)
MEDICARE OBSERVATION STATUS NOTIFICATION   Patient Details  Name: Louis Reid MRN: [id number] Date of Birth: 25-Jun-1932   Medicare Observation Status Notification Given:  Yes    Chere Babson E Tesslyn Baumert, LCSW 07/20/2021, 10:00 AM

## 2021-07-21 ENCOUNTER — Encounter: Payer: Self-pay | Admitting: Internal Medicine

## 2021-07-21 DIAGNOSIS — Z7189 Other specified counseling: Secondary | ICD-10-CM

## 2021-07-21 DIAGNOSIS — Z515 Encounter for palliative care: Secondary | ICD-10-CM

## 2021-07-21 MED ORDER — ZINC SULFATE 220 (50 ZN) MG PO CAPS
220.0000 mg | ORAL_CAPSULE | Freq: Every day | ORAL | Status: DC
  Administered 2021-07-21 – 2021-07-25 (×5): 220 mg via ORAL
  Filled 2021-07-21 (×5): qty 1

## 2021-07-21 MED ORDER — ASCORBIC ACID 500 MG PO TABS
500.0000 mg | ORAL_TABLET | Freq: Two times a day (BID) | ORAL | Status: DC
  Administered 2021-07-21 – 2021-07-25 (×8): 500 mg via ORAL
  Filled 2021-07-21 (×8): qty 1

## 2021-07-21 NOTE — NC FL2 (Signed)
[place] LEVEL OF CARE SCREENING TOOL     IDENTIFICATION  Patient Name: Louis Reid Birthdate: [birth year] Sex: male Admission Date (Current Location): 07/18/2021  [facility] and [place] Number:  Engineering geologist and Address:  [facility], [address], [place], [place] [address]      Provider Number: [id number]  Attending Physician Name and Address:  Sharen Hones, MD  Relative Name and Phone Number:       Current Level of Care: Hospital Recommended Level of Care: [place] Prior Approval Number:    Date Approved/Denied:   PASRR Number: [id number]  Discharge Plan: SNF    Current Diagnoses: Patient Active Problem List   Diagnosis Date Noted   COVID-19 virus infection 07/19/2021   Pressure injury of skin 04/04/2021   AKI (acute kidney injury) ([place]) 04/02/2021   Splenic infarct    Hepatic steatosis    Stage 3b chronic kidney disease ([place]) 03/11/2021   Unable to care for self 03/11/2021   Constipation 03/11/2021   Failure to thrive in adult 03/11/2021   Protein-calorie malnutrition, severe 02/22/2021   Generalized weakness    Palliative care encounter    Goals of care, counseling/discussion    Encounter for hospice care discussion    Heart failure ([place]) 02/28/2018   Decreased cardiac ejection fraction 01/10/2018   Nonspecific chest pain    Chronic systolic CHF (congestive heart failure) ([place])    Unstable angina ([place]):  (01/20/2017) a. 3-v CAD w/ patent SVG to diag and patent sequential SVG to OM 2 and OM 3. Patent prox LAD stent. LIMA to LAD and SVG to RCA known occl. stable prox RCA sten  01/19/2017   Paroxysmal atrial fibrillation ([facility]) 03/17/2016   Claudication ([place]) 01/22/2014   Chronic kidney disease 12/26/2013   ICD (implantable cardioverter-defibrillator) in place 02/16/2013   Ischemic cardiomyopathy 09/09/2012   Postop check 07/05/2012   STEMI (ST elevation myocardial infarction) ([place]) 05/18/2012    Anemia in chronic kidney disease(285.21) 05/17/2012   PE (pulmonary embolism) 05/17/2012   Physical deconditioning 05/17/2012   Anorexia 05/17/2012   Cardiogenic shock ([place]) 05/16/2012   Arterial hypotension 05/16/2012   Ventricular fibrillation arrest 05/10/2012   Cardiac arrest ([place]) 05/10/2012   Acute respiratory failure with hypoxia 2/2 cardiac arrest 05/10/2012   Acute encephalopathy 2/2 cardiac arrest 05/10/2012   Hyperglycemia 05/10/2012   Odynophagia 05/07/2012   Coronary artery disease 07/09/2011   History of coronary artery bypass surgery 07/09/2011   DYSPNEA 06/04/2010   HLD (hyperlipidemia) 04/17/2009   Essential hypertension 04/17/2009   Asymptomatic old MI (myocardial infarction) 04/17/2009   TOBACCO USE, QUIT 04/17/2009    Orientation RESPIRATION BLADDER Height & Weight     Self, Time, Situation, Place  Normal Continent Weight: 114 lb 10.2 oz (52 kg) Height:  5\' 11"  (180.3 cm)  BEHAVIORAL SYMPTOMS/MOOD NEUROLOGICAL BOWEL NUTRITION STATUS      Continent Diet (heart healthy, thin liquids)  AMBULATORY STATUS COMMUNICATION OF NEEDS Skin   Limited Assist Verbally PU Stage and Appropriate Care   PU Stage 2 Dressing:  (Sacrum, foam dressing)                   Personal Care Assistance Level of Assistance  Dressing, Feeding, Bathing Bathing Assistance: Limited assistance Feeding assistance: Independent Dressing Assistance: Limited assistance     Functional Limitations Info  Sight, Hearing, Speech Sight Info: Adequate Hearing Info: Adequate Speech Info: Adequate    SPECIAL CARE FACTORS FREQUENCY  PT (By  licensed PT), OT (By licensed OT)     PT Frequency: 5x OT Frequency: 5x            Contractures Contractures Info: Not present    Additional Factors Info  Code Status, Allergies Code Status Info: DNR Allergies Info: no known allergies           Current Medications (07/21/2021):  This is the current hospital active medication list Current  Facility-Administered Medications  Medication Dose Route Frequency Provider Last Rate Last Admin   0.9 %  sodium chloride infusion   Intravenous PRN Sharen Hones, MD   Stopped at 07/20/21 1215   acetaminophen (TYLENOL) tablet 650 mg  650 mg Oral Q6H PRN Lorella Nimrod, MD       Or   acetaminophen (TYLENOL) suppository 650 mg  650 mg Rectal Q6H PRN Lorella Nimrod, MD       apixaban Arne [place]) tablet 2.5 mg  2.5 mg Oral BID Lorella Nimrod, MD   2.5 mg at 07/20/21 2126   cholecalciferol (VITAMIN D) tablet 1,000 Units  1,000 Units Oral Daily Lorella Nimrod, MD   1,000 Units at 07/20/21 1039   feeding supplement (ENSURE ENLIVE / ENSURE PLUS) liquid 237 mL  237 mL Oral TID BM Lorella Nimrod, MD   237 mL at 07/20/21 1500   ferrous sulfate tablet 325 mg  325 mg Oral Q breakfast Lorella Nimrod, MD   325 mg at 07/20/21 1038   finasteride (PROSCAR) tablet 5 mg  5 mg Oral Daily Lorella Nimrod, MD   5 mg at 07/20/21 1038   furosemide (LASIX) tablet 10 mg  10 mg Oral Daily Sharen Hones, MD   10 mg at 07/20/21 1038   mirtazapine (REMERON) tablet 15 mg  15 mg Oral QHS Lorella Nimrod, MD   15 mg at 07/20/21 2126   multivitamin with minerals tablet 1 tablet  1 tablet Oral Daily Sharen Hones, MD   1 tablet at 07/20/21 1038   nutrition supplement (JUVEN) (JUVEN) powder packet 1 packet  1 packet Oral BID BM Sharen Hones, MD   1 packet at 07/20/21 1224   polyethylene glycol (MIRALAX / GLYCOLAX) packet 17 g  17 g Oral Daily Sharen Hones, MD   17 g at 07/20/21 1039   remdesivir 100 mg in sodium chloride 0.9 % 100 mL IVPB  100 mg Intravenous Daily Darnelle Bos, [place] at 07/20/21 1107   rosuvastatin (CRESTOR) tablet 20 mg  20 mg Oral QHS Lorella Nimrod, MD   20 mg at 07/20/21 2125   senna-docusate (Senokot-S) tablet 2 tablet  2 tablet Oral BID Sharen Hones, MD   2 tablet at 07/20/21 2126     Discharge Medications: Please see discharge summary for a list of discharge medications.  Relevant Imaging  Results:  Relevant Lab Results:   Additional Information [id number]  Gerrianne Scale Geraldean Walen, LCSW

## 2021-07-21 NOTE — Progress Notes (Addendum)
Nutrition Follow-up  DOCUMENTATION CODES:   Severe malnutrition in context of chronic illness, Underweight  INTERVENTION:   -500 mg vitamin C BID -220 mg zinc sulfate daily x 14 days -Ensure Enlive po TID, each supplement provides 350 kcal and 20 grams of protein.  -MVI with minerals daily -Liberalize diet to regular -D/c Juven  NUTRITION DIAGNOSIS:   Severe Malnutrition related to chronic illness (CHF) as evidenced by percent weight loss, severe fat depletion, severe muscle depletion.  Ongoing  GOAL:   Patient will meet greater than or equal to 90% of their needs  Progressing   MONITOR:   PO intake, Supplement acceptance, Labs, Weight trends, Skin, I & O's  REASON FOR ASSESSMENT:   Consult Assessment of nutrition requirement/status  ASSESSMENT:   88 y.o. male with medical history of CAD, heart failure ejection fraction 20%, afib, stage 3 CKD, BPH, HTN, dyslipidemia, PE, and malnutrition. He presented to the ED due to generalized weakness and poor appetite. He has had multiple recent hospitalizations for the same. In the ED he was found to be COVID positive.  Reviewed I/O's: -613 ml x 24 hours and -693 ml since admission  UUP: 825 ml x 24 hours  Spoke with pt at bedside, who was pleasant and in good spirits today. Pt smiling and joking with this RD calling this RD "the deputy sheriff" and his "girlfriend". He reports that he has had a decreased appetite secondary to loss of taste and smell, which occurred over the past few weeks. He shares "I had COVID and didn't even know it, but I think there are other factors that made me lose my taste". Per pt, highly seasoned foods, such as sweets, are most palatable to him, but he reports he is unable to eat much at one time and feels full after drinking water, soup, and Ensure.   Pt shares that he has lost weight. Reviewed wt hx; pt has experienced a 12% wt loss over the past 3 months, which is significant for time frame.    Palliative care following. Pt shares he feels like he is "at a rock and a hard place" when trying to determine discharge disposition. He shares that he is already followed by hospice. RD provided emotional support and reflective listening. Pt very appreciative of RD visit.   Discussed importance of good meal and supplement intake to promote healing. Encouraged pt to drink Ensure with med pass. RD also liberalized diet to regular for widest variety of meal selections. Pt shares "I know I'm not eating enough to keep a mouse alive" and was disappointed not to be able to get a biscuit this morning at breakfast. RD gave permission for pt to liberalize diet. Pt expressed appreciation for this RD to "open up the bread lines" for him.   Medications reviewed and include vitamin C, vitamin D, ferrous sulfate, lasix, remeron, miralax, senokot, zinc, and remdesivir.   Labs reviewed.   NUTRITION - FOCUSED PHYSICAL EXAM:  Flowsheet Row Most Recent Value  Orbital Region Severe depletion  Upper Arm Region Severe depletion  Thoracic and Lumbar Region Severe depletion  Buccal Region Severe depletion  Temple Region Severe depletion  Clavicle Bone Region Severe depletion  Clavicle and Acromion Bone Region Severe depletion  Scapular Bone Region Severe depletion  Dorsal Hand Severe depletion  Patellar Region Severe depletion  Anterior Thigh Region Severe depletion  Posterior Calf Region Severe depletion  Edema (RD Assessment) None  Hair Reviewed  Eyes Reviewed  Mouth Reviewed  Skin Reviewed  Nails Reviewed       Diet Order:   Diet Order             Diet regular Room service appropriate? Yes; Fluid consistency: Thin  Diet effective now                   EDUCATION NEEDS:   Education needs have been addressed  Skin:  Skin Assessment: Skin Integrity Issues: Skin Integrity Issues:: Stage II Stage II: sacrum  Last BM:  07/21/21  Height:   Ht Readings from Last 1 Encounters:   07/18/21 5\' 11"  (1.803 m)    Weight:   Wt Readings from Last 1 Encounters:  07/20/21 52 kg    Ideal Body Weight:  78.2 kg  BMI:  Body mass index is 15.99 kg/m.  Estimated Nutritional Needs:   Kcal:  1900-2100  Protein:  90-105 grams  Fluid:  > 1.9 L    Loistine Chance, RD, LDN, [place] Registered Dietitian II Certified Diabetes Care and Education Specialist Please refer to [facility] for RD and/or RD on-call/weekend/after hours pager

## 2021-07-21 NOTE — Progress Notes (Unsigned)
Office Visit    Patient Name: Louis Reid Date of Encounter: 07/21/2021  Primary Care Provider:  [name], MD Primary Cardiologist:  Kirk Ruths, MD  Chief Complaint    88 year old male with a history of   Past Medical History    Past Medical History:  Diagnosis Date   AICD (automatic cardioverter/defibrillator) present 09/13/2012   Anemia    CAD (coronary artery disease)    a. s/p remote CABG, b. AL STEMI c/b VF arrest in 05/2012 => LHC 05/10/12: Severe 3v CAD, S-OM1/2 ok, SVG-Dx ok, SVG-RCA occluded, LIMA-LAD atretic, proximal LAD occluded. EF 20% with anterior apical HK=> salvage PCI with POBA and thrombectomy of the occluded LAD;  c. 01/2017: atretic LIMA_LAD and known occluded SVG-RCA. Patent LAD stent and 70% proxRCA stenosis (FFR 0.88).    Chronic systolic heart failure ([place])    a. Echo 12/13: EF 20-25%, anteroseptal, inferior, apical HK, mildly reduced RV function, trivial pericardial effusion;   b.  Echo 3/14:  Ant, septal, apical AK, EF 25%   Clotting disorder ([place])    Depression    "years ago" (02/28/2018)   History of kidney stones    HLD (hyperlipidemia)    Hypertension    "I've never had high blood pressure; on RX for other reasons" (02/28/2018)   Inguinal hernia    "have one now on my left side" (09/13/2012); (02/28/2018)   Ischemic cardiomyopathy    Myocardial infarction [facility]) 1996;  05/2012   Osteoporosis    Paroxysmal atrial fibrillation ([place]) 03/17/2016   noted on device interrogation,  will follow burden   Pectus excavatum    Pulmonary embolism ([place])    a. after adhesiolysis for SBO in 04/2012 => coumadin for 6 mos   Renal cyst    SBO (small bowel obstruction) [facility]) November 2013   s/p lysis of adhesions   Sinoatrial node dysfunction ([place])    Skin cancer    "left ear; face" (02/28/2018)   Ventricular fibrillation ([place]) 05/2012   . in setting of AL STEMI 12/13 => EF 20-25% => d/c on LifeVest (repeat echo planned in 08/2012)   Past  Surgical History:  Procedure Laterality Date   CATARACT EXTRACTION W/ INTRAOCULAR LENS  IMPLANT, BILATERAL Bilateral    COLONOSCOPY     CORONARY ANGIOPLASTY WITH STENT PLACEMENT     "I've got 2 or 3 stents" (02/28/2018)   [place]   "CABG X5" (09/13/2012)   CYSTOSCOPY/RETROGRADE/URETEROSCOPY/STONE EXTRACTION WITH BASKET  1990's   ESOPHAGOGASTRODUODENOSCOPY  05/08/2012   Procedure: ESOPHAGOGASTRODUODENOSCOPY (EGD);  Surgeon: Juanita Craver, MD;  Location: [facility] ENDOSCOPY;  Service: Endoscopy;  Laterality: N/A;  Nevin Bloodgood put on for Dr. Collene Mares , Dr. Collene Mares will call if she wants another time   IMPLANTABLE CARDIOVERTER DEFIBRILLATOR IMPLANT N/A 09/13/2012   SJM Ellipse ER implanted by Dr Rayann Heman for secondary prevention   INGUINAL HERNIA REPAIR Right 1996   INTRAVASCULAR PRESSURE WIRE/FFR STUDY N/A 01/20/2017   Procedure: INTRAVASCULAR PRESSURE WIRE/FFR STUDY;  Surgeon: Wellington Hampshire, MD;  Location: [place] CV LAB;  Service: Cardiovascular;  Laterality: N/A;   LAPAROTOMY  04/27/2012   Procedure: EXPLORATORY LAPAROTOMY;  Surgeon: Gwenyth Ober, MD;  Location: [place];  Service: General;  Laterality: N/A;   LEFT HEART CATH AND CORS/GRAFTS ANGIOGRAPHY N/A 01/20/2017   Procedure: LEFT HEART CATH AND CORS/GRAFTS ANGIOGRAPHY;  Surgeon: Wellington Hampshire, MD;  Location: [place] CV LAB;  Service: Cardiovascular;  Laterality: N/A;   LEFT HEART  CATHETERIZATION WITH CORONARY ANGIOGRAM N/A 05/10/2012   Procedure: LEFT HEART CATHETERIZATION WITH CORONARY ANGIOGRAM;  Surgeon: Burnell Blanks, MD;  Location: [facility] CATH LAB;  Service: Cardiovascular;  Laterality: N/A;   PTCA     percutaneous transluminal coronary intervention and brachy therapy, Bruce R. Olevia Perches, MD. EF 60%   RIGHT HEART CATH N/A 03/01/2018   Procedure: RIGHT HEART CATH;  Surgeon: Jolaine Artist, MD;  Location: [place] CV LAB;  Service: Cardiovascular;  Laterality: N/A;   SKIN CANCER EXCISION     "left ear; face"  (02/28/2018)   TRANSURETHRAL RESECTION OF PROSTATE      Allergies  No Known Allergies  History of Present Illness    ***  Home Medications    No current facility-administered medications for this visit.   No current outpatient medications on file.   Facility-Administered Medications Ordered in Other Visits  Medication Dose Route Frequency Provider Last Rate Last Admin   0.9 %  sodium chloride infusion   Intravenous PRN Sharen Hones, MD   Stopped at 07/20/21 1215   acetaminophen (TYLENOL) tablet 650 mg  650 mg Oral Q6H PRN Lorella Nimrod, MD       Or   acetaminophen (TYLENOL) suppository 650 mg  650 mg Rectal Q6H PRN Lorella Nimrod, MD       apixaban (ELIQUIS) tablet 2.5 mg  2.5 mg Oral BID Lorella Nimrod, MD   2.5 mg at 07/21/21 1151   cholecalciferol (VITAMIN D) tablet 1,000 Units  1,000 Units Oral Daily Lorella Nimrod, MD   1,000 Units at 07/21/21 1153   feeding supplement (ENSURE ENLIVE / ENSURE PLUS) liquid 237 mL  237 mL Oral TID BM Lorella Nimrod, MD   237 mL at 07/21/21 1153   ferrous sulfate tablet 325 mg  325 mg Oral Q breakfast Lorella Nimrod, MD   325 mg at 07/21/21 1151   finasteride (PROSCAR) tablet 5 mg  5 mg Oral Daily Lorella Nimrod, MD   5 mg at 07/21/21 1151   furosemide (LASIX) tablet 10 mg  10 mg Oral Daily Sharen Hones, MD   10 mg at 07/21/21 1152   mirtazapine (REMERON) tablet 15 mg  15 mg Oral QHS Lorella Nimrod, MD   15 mg at 07/20/21 2126   multivitamin with minerals tablet 1 tablet  1 tablet Oral Daily Sharen Hones, MD   1 tablet at 07/21/21 1151   nutrition supplement (JUVEN) (JUVEN) powder packet 1 packet  1 packet Oral BID BM Sharen Hones, MD   1 packet at 07/21/21 1159   polyethylene glycol (MIRALAX / GLYCOLAX) packet 17 g  17 g Oral Daily Sharen Hones, MD   17 g at 07/20/21 1039   remdesivir 100 mg in sodium chloride 0.9 % 100 mL IVPB  100 mg Intravenous Daily Darnelle Bos, RPH 200 mL/hr at 07/21/21 1151 100 mg at 07/21/21 1151   rosuvastatin (CRESTOR)  tablet 20 mg  20 mg Oral QHS Lorella Nimrod, MD   20 mg at 07/20/21 2125   senna-docusate (Senokot-S) tablet 2 tablet  2 tablet Oral BID Sharen Hones, MD   2 tablet at 07/21/21 1152     Review of Systems    ***.  All other systems reviewed and are otherwise negative except as noted above.    Physical Exam    VS:  There were no vitals taken for this visit. , BMI There is no height or weight on file to calculate BMI.     GEN: Well nourished,  well developed, in no acute distress. HEENT: normal. Neck: Supple, no JVD, carotid bruits, or masses. Cardiac: RRR, no murmurs, rubs, or gallops. No clubbing, cyanosis, edema.  Radials/DP/PT 2+ and equal bilaterally.  Respiratory:  Respirations regular and unlabored, clear to auscultation bilaterally. GI: Soft, nontender, nondistended, BS + x 4. MS: no deformity or atrophy. Skin: warm and dry, no rash. Neuro:  Strength and sensation are intact. Psych: Normal affect.  Accessory Clinical Findings    ECG personally reviewed by me today - *** - no acute changes.  Lab Results  Component Value Date   WBC 7.0 07/18/2021   HGB 13.6 07/18/2021   HCT 42.5 07/18/2021   MCV 99.1 07/18/2021   PLT 116 (L) 07/18/2021   Lab Results  Component Value Date   CREATININE 1.28 (H) 07/19/2021   BUN 35 (H) 07/19/2021   NA 143 07/19/2021   K 3.8 07/19/2021   CL 111 07/19/2021   CO2 25 07/19/2021   Lab Results  Component Value Date   ALT 35 07/18/2021   AST 42 (H) 07/18/2021   ALKPHOS 93 07/18/2021   BILITOT 0.9 07/18/2021   Lab Results  Component Value Date   CHOL 89 (L) 08/24/2016   HDL 37 (L) 08/24/2016   LDLCALC 39 08/24/2016   TRIG 65 08/24/2016   CHOLHDL 2.4 08/24/2016    Lab Results  Component Value Date   HGBA1C 5.9 (H) 05/18/2012    Assessment & Plan    1.  ***   Lenna Sciara, NP 07/21/2021, 12:18 PM

## 2021-07-21 NOTE — Consult Note (Signed)
Consultation Note Date: 07/21/2021   Patient Name: Louis Reid  DOB: 1932-12-24  MRN: [id number]  Age / Sex: 88 y.o., male  PCP: Louis Downing, MD Referring Physician: Sharen Hones, MD  Reason for Consultation: Establishing goals of care  HPI/Patient Profile: 88 y.o. male  with past medical history of coronary artery disease, heart failure with an EF of 20%, paroxysmal A-fib, CKD 3, BPH, HTN/HLD, PE, malnutrition with BMI of 16 admitted on 07/18/2021 with failure to thrive, COVID-positive.   Clinical Assessment and Goals of Care: I have reviewed medical records including EPIC notes, labs and imaging, received report from RN, assessed the patient.  Louis Reid is sitting up in the bed in his room.  He greets me, making and mostly keeping eye contact.  He is alert and oriented, able to make his basic needs known.  There is no family at bedside at this time.    We meet at bedside to discuss diagnosis prognosis, GOC, EOL wishes, disposition and options. I introduced Palliative Medicine as specialized medical care for people living with serious illness. It focuses on providing relief from the symptoms and stress of a serious illness. The goal is to improve quality of life for both the patient and the family.  Louis Reid has been seen by inpatient palliative services in the past.  He was active with [facility] hospice care but has recently been transition to palliative care.  We discussed a brief life review of the patient.  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  He has 1 child, son Louis Reid.  We then focused on their current illness.  We talked about Louis Reid acute and chronic health concerns.  We talked about the treatment plan.  Overall, his medical conditions are stable, he is experiencing weakness, poor  appetite, failure to thrive.  Louis Reid tells me that he is living independently, but he is having difficulty even walking to the mailbox.  He is very clearly torn about discharge.  He states that he would not want anyone coming into his house, declines PT, but also does not feel that he wants to go to short-term rehab.  He asks for a few more days in the hospital to see how he is able to recover his strength.  He is being followed by registered dietitian.  The natural disease trajectory and expectations at EOL were discussed.  Louis Reid tells me that he has long-term care policy with [facility].  He shares that it has been difficult to implement the services.  He also tells me that he is working with his son, Louis Reid, for admittance to [place] home.  When I ask if this is for short-term or long-term he tells me that he is unsure.    Advanced directives, concepts specific to code status, were not discussed today as Louis Reid is well-established DNR.  Hospice and Palliative Care  services outpatient were explained and offered.  It seems that Louis Reid has been active with [facility] palliative/hospice care since May 2021.  He tells me that he was recently transitioned from hospice services to palliative services.  Louis Reid tells me that he would like to return to hospice services if/when he qualifies.  After chart review it seems that Louis Reid has always been active with [facility] palliative care, not hospice.  Discussed the importance of continued conversation with family and the medical providers regarding overall plan of care and treatment options, ensuring decisions are within the context of the patients values and GOCs.  Questions and concerns were addressed.  The patient/family was encouraged to call with questions or concerns.  PMT will continue to support holistically.  Conference with attending, bedside nursing staff, transition of care team, [facility] hospice representative related to patient condition,  needs, goals of care, disposition.   HCPOA NEXT OF KIN -son, Louis Reid, is Louis Reid surrogate decision maker.    SUMMARY OF RECOMMENDATIONS   At this point continue to treat the treatable but no CPR or intubation. Considering short-term rehab versus going home Active with [facility] outpatient palliative services, recently released from hospice care States he would like to go back to hospice care if qualified   Code Status/Advance Care Planning: DNR  Symptom Management:  Per hospitalist, no additional needs at this time.  Palliative Prophylaxis:  Frequent Pain Assessment and Oral Care  Additional Recommendations (Limitations, Scope, Preferences): Continue to treat the treatable but no CPR or intubation  Psycho-social/Spiritual:  Desire for further Chaplaincy support:no Additional Recommendations: Caregiving  Support/Resources and Referral to Community Resources   Prognosis:  Unable to determine, based on outcomes.  6 months or less would not be surprising based on chronic illness burden, 4 hospital visits in the last 6 months.  Discharge Planning:  To be determined.  Louis Reid is considering short-term rehab versus going home.       Primary Diagnoses: Present on Admission:  Failure to thrive in adult  Protein-calorie malnutrition, severe  Paroxysmal atrial fibrillation (HCC)  Pressure injury of skin  Ischemic cardiomyopathy  Essential hypertension   I have reviewed the medical record, interviewed the patient and family, and examined the patient. The following aspects are pertinent.  Past Medical History:  Diagnosis Date   AICD (automatic cardioverter/defibrillator) present 09/13/2012   Anemia    CAD (coronary artery disease)    a. s/p remote CABG, b. AL STEMI c/b VF arrest in 05/2012 => LHC 05/10/12: Severe 3v CAD, S-OM1/2 ok, SVG-Dx ok, SVG-RCA occluded, LIMA-LAD atretic, proximal LAD occluded. EF 20% with anterior apical HK=> salvage PCI with POBA and thrombectomy  of the occluded LAD;  c. 01/2017: atretic LIMA_LAD and known occluded SVG-RCA. Patent LAD stent and 70% proxRCA stenosis (FFR 0.88).    Chronic systolic heart failure ([place])    a. Echo 12/13: EF 20-25%, anteroseptal, inferior, apical HK, mildly reduced RV function, trivial pericardial effusion;   b.  Echo 3/14:  Ant, septal, apical AK, EF 25%   Clotting disorder ([place])    Depression    "years ago" (02/28/2018)   History of kidney stones    HLD (hyperlipidemia)    Hypertension    "I've never had high blood pressure; on RX for other reasons" (02/28/2018)   Inguinal hernia    "have one now on my left side" (09/13/2012); (02/28/2018)   Ischemic cardiomyopathy    Myocardial infarction [facility]) 1996;  05/2012   Osteoporosis  Paroxysmal atrial fibrillation ([place]) 03/17/2016   noted on device interrogation,  will follow burden   Pectus excavatum    Pulmonary embolism ([place])    a. after adhesiolysis for SBO in 04/2012 => coumadin for 6 mos   Renal cyst    SBO (small bowel obstruction) [facility]) November 2013   s/p lysis of adhesions   Sinoatrial node dysfunction ([place])    Skin cancer    "left ear; face" (02/28/2018)   Ventricular fibrillation ([place]) 05/2012   . in setting of AL STEMI 12/13 => EF 20-25% => d/c on LifeVest (repeat echo planned in 08/2012)   Social History   Socioeconomic History   Marital status: Widowed    Spouse name: Not on file   Number of children: 1   Years of education: Not on file   Highest education level: Not on file  Occupational History   Occupation: [name]    Employer: RETIRED  Tobacco Use   Smoking status: Former    Types: Cigarettes   Smokeless tobacco: Never   Tobacco comments:    09/14/2011; 02/28/2018  "quit smoking when I was ~ 17; probably didn't smoke 10 packs in my life"  Vaping Use   Vaping Use: Never used  Substance and Sexual Activity   Alcohol use: Never   Drug use: Never   Sexual activity: Not Currently  Other Topics Concern   Not on file   Social History Narrative   Not on file   Social Determinants of Health   Financial Resource Strain: Low Risk    Difficulty of Paying Living Expenses: Not very hard  Food Insecurity: No Food Insecurity   Worried About Running Out of Food in the Last Year: Never true   [place] in the Last Year: Never true  Transportation Needs: No Transportation Needs   Lack of Transportation (Medical): No   Lack of Transportation (Non-Medical): No  Physical Activity: Not on file  Stress: Not on file  Social Connections: Not on file   Family History  Problem Relation Age of Onset   Heart disease Father    Other Mother        brain tumor   Colon cancer Brother    Cancer Brother        Liver   Heart disease Brother    Scheduled Meds:  apixaban  2.5 mg Oral BID   cholecalciferol  1,000 Units Oral Daily   feeding supplement  237 mL Oral TID BM   ferrous sulfate  325 mg Oral Q breakfast   finasteride  5 mg Oral Daily   furosemide  10 mg Oral Daily   mirtazapine  15 mg Oral QHS   multivitamin with minerals  1 tablet Oral Daily   nutrition supplement (JUVEN)  1 packet Oral BID BM   polyethylene glycol  17 g Oral Daily   rosuvastatin  20 mg Oral QHS   senna-docusate  2 tablet Oral BID   Continuous Infusions:  sodium chloride Stopped (07/20/21 1215)   remdesivir 100 mg in NS 100 mL 100 mg (07/21/21 1151)   PRN Meds:.sodium chloride, acetaminophen **OR** acetaminophen Medications Prior to Admission:  Prior to Admission medications   Medication Sig Start Date End Date Taking? Authorizing Provider  acetaminophen (TYLENOL) 500 MG tablet Take 1 tablet (500 mg total) by mouth every 6 (six) hours as needed for mild pain (or Fever >/= 101). 03/15/21  Yes Cherene Altes, MD  apixaban (ELIQUIS) 2.5 MG  TABS tablet Take 1 tablet (2.5 mg total) by mouth 2 (two) times daily. 07/09/20  Yes Bensimhon, Shaune Pascal, MD  Cholecalciferol (D3-1000) 25 MCG (1000 UT) capsule Take 1,000 Units by mouth daily.    Yes [provider]  feeding supplement (ENSURE ENLIVE / ENSURE PLUS) LIQD Take 237 mLs by mouth 3 (three) times daily between meals. 03/15/21  Yes Cherene Altes, MD  ferrous sulfate 325 (65 FE) MG tablet Take 325 mg by mouth daily with breakfast.   Yes [provider]  finasteride (PROSCAR) 5 MG tablet Take 5 mg by mouth daily. 08/30/19  Yes [provider]  furosemide (LASIX) 20 MG tablet Take 10 mg by mouth daily.   Yes [provider]  Multiple Vitamins-Minerals (ICAPS AREDS 2 PO) Take 1 capsule by mouth 2 (two) times daily.   Yes [provider]  rosuvastatin (CRESTOR) 20 MG tablet TAKE 1 TABLET BY MOUTH EVERY DAY 04/14/21  Yes Crenshaw, Denice Bors, MD  senna-docusate (SENOKOT-S) 8.6-50 MG tablet Take 1 tablet by mouth 2 (two) times daily. 03/15/21  Yes Cherene Altes, MD  zinc sulfate 220 (50 Zn) MG capsule Take 220 mg by mouth daily.   Yes [provider]   No Known Allergies Review of Systems  Unable to perform ROS: Age   Physical Exam Vitals and nursing note reviewed.  Constitutional:      General: He is not in acute distress.    Appearance: He is ill-appearing.  HENT:     Mouth/Throat:     Mouth: Mucous membranes are moist.  Cardiovascular:     Rate and Rhythm: Normal rate.  Pulmonary:     Effort: Pulmonary effort is normal. No respiratory distress.  Musculoskeletal:        General: No swelling.     Comments: Frail and thin, muscle wasting  Neurological:     Mental Status: He is alert and oriented to person, place, and time.  Psychiatric:        Mood and Affect: Mood normal.        Behavior: Behavior normal.    Vital Signs: BP 117/79 (BP Location: Right Arm)    Pulse (!) 50    Temp 98.1 F (36.7 C) (Oral)    Resp 16    Ht 5\' 11"  (1.803 m)    Wt 52 kg    SpO2 94%    BMI 15.99 kg/m  Pain Scale: 0-10   Pain Score: 0-No pain   SpO2: SpO2: 94 % O2 Device:SpO2: 94 % O2 Flow Rate: .   IO: Intake/output  summary:  Intake/Output Summary (Last 24 hours) at 07/21/2021 1239 Last data filed at 07/20/2021 2300 Gross per 24 hour  Intake 211.6 ml  Output 825 ml  Net -613.4 ml    LBM: Last BM Date:  (Per patient last BM 2-3 days ago. Miralax and senokot given.) Baseline Weight: Weight: 59 kg Most recent weight: Weight: 52 kg     Palliative Assessment/Data:   Flowsheet Rows    Flowsheet Row Most Recent Value  Intake Tab   Referral Department Hospitalist  Unit at Time of Referral Med/Surg Unit  Palliative Care Primary Diagnosis Other (Comment)  Date Notified 07/18/21  Palliative Care Type Return patient Palliative Care  Reason for referral Clarify Goals of Care  Date of Admission 07/18/21  Date first seen by Palliative Care 07/21/21  # of days Palliative referral response time 3 Day(s)  # of days IP prior to Palliative referral  0  Clinical Assessment   Palliative Performance Scale Score 30%  Pain Max last 24 hours Not able to report  Pain Min Last 24 hours Not able to report  Dyspnea Max Last 24 Hours Not able to report  Dyspnea Min Last 24 hours Not able to report  Psychosocial & Spiritual Assessment   Palliative Care Outcomes        Time In: 0855 Time Out: 1010 Time Total: 75 minutes  Greater than 50%  of this time was spent counseling and coordinating care related to the above assessment and plan.  Signed by: Drue Novel, NP   Please contact Palliative Medicine Team phone at [phone] for questions and concerns.  For individual provider: See Shea Evans

## 2021-07-21 NOTE — Progress Notes (Signed)
PROGRESS NOTE    Louis Reid  IHK:[id number] DOB: 1932/12/19 DOA: 07/18/2021 PCP: [name], MD   Chief complaint.  Generalized weakness. Brief Narrative:   Louis Reid is a 88 y.o. male with medical history significant of coronary disease, heart failure ejection fraction 20%, paroxysmal atrial fibrillation, chronic kidney disease stage IIIb, BPH, hypertension, dyslipidemia, PE, malnutrition who presented with generalized weakness and poor appetite.  Multiple recent hospitalizations with similar symptoms and failure to thrive. He was able to live at home independently prior to admission, he was found to have a COVID at admission.  Assessment & Plan:   Principal Problem:   Failure to thrive in adult Active Problems:   Essential hypertension   Ischemic cardiomyopathy   Chronic systolic CHF (congestive heart failure) (HCC)   Protein-calorie malnutrition, severe   Paroxysmal atrial fibrillation (HCC)   Pressure injury of skin   COVID-19 virus infection  COVID virus infection. Failure to thrive. Generalized weakness secondary to COVID infection. Severe protein calorie malnutrition. Patient had a functional decline over the last 2 or 3 days, this is presumably secondary to COVID infection.  He is currently receiving remdesivir. We will complete her remdesivir.  He is not requiring oxygen. He still has second weakness, PT had recommended nursing home placement.  Chronic kidney disease stage IIIb Acute kidney injury ruled out. Conditions are stable.   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  Ejection fraction 20 to 25%. Continue lower dose Lasix  Paroxysmal atrial fibrillation. Continue Eliquis.   Chronic thrombocytopenia. Stable.  Essential hypertension.     DVT prophylaxis: Eliquis Code Status: DNR Family Communication:  Disposition Plan:      Status is: Inpatient due to severity of disease, IV treatment.    I/O  last 3 completed shifts: In: 211.6 [I.V.:11.6; IV Piggyback:200] Out: 7564 [Urine:1450] No intake/output data recorded.   Subjective: Patient still feels weak, but was able to stand up and walk for a few steps. He had a multiple stools after given stool softener. No abdominal pain nausea vomiting pain No short of breath or cough.  Objective: Vitals:   07/20/21 2020 07/20/21 2356 07/21/21 0534 07/21/21 0841  BP: 100/80 106/72 (!) 104/57 117/79  Pulse: 77 72 76 (!) 50  Resp: 18 16 18 16   Temp: 98.1 F (36.7 C) 97.8 F (36.6 C) 97.7 F (36.5 C) 98.1 F (36.7 C)  TempSrc:  Oral  Oral  SpO2: 98% 98% 97% 94%  Weight:      Height:        Intake/Output Summary (Last 24 hours) at 07/21/2021 1005 Last data filed at 07/20/2021 2300 Gross per 24 hour  Intake 211.6 ml  Output 825 ml  Net -613.4 ml   Filed Weights   07/18/21 2041 07/19/21 0424 07/20/21 0445  Weight: 51.9 kg 54 kg 52 kg    Examination:  General exam: Appears calm and comfortable  Respiratory system: Clear to auscultation. Respiratory effort normal. Cardiovascular system: S1 & S2 heard, RRR. No JVD, murmurs, rubs, gallops or clicks. No pedal edema. Gastrointestinal system: Abdomen is nondistended, soft and nontender. No organomegaly or masses felt. Normal bowel sounds heard. Central nervous system: Alert and oriented. No focal neurological deficits. Extremities: Symmetric 5 x 5 power. Skin: No rashes, lesions or ulcers Psychiatry: Judgement and insight appear normal. Mood & affect appropriate.     Data Reviewed: I have personally reviewed following labs and imaging studies  CBC: Recent Labs  Lab 07/18/21 1351  WBC  7.0  HGB 13.6  HCT 42.5  MCV 99.1  PLT 027*   Basic Metabolic Panel: Recent Labs  Lab 07/18/21 1351 07/19/21 0552  NA 141 143  K 3.8 3.8  CL 110 111  CO2 21* 25  GLUCOSE 170* 91  BUN 37* 35*  CREATININE 1.52* 1.28*  CALCIUM 9.2 8.8*   GFR: Estimated Creatinine Clearance: 30.5  mL/min (A) (by C-G formula based on SCr of 1.28 mg/dL (H)). Liver Function Tests: Recent Labs  Lab 07/18/21 1351  AST 42*  ALT 35  ALKPHOS 93  BILITOT 0.9  PROT 6.0*  ALBUMIN 3.5   No results for input(s): LIPASE, AMYLASE in the last 168 hours. No results for input(s): AMMONIA in the last 168 hours. Coagulation Profile: No results for input(s): INR, PROTIME in the last 168 hours. Cardiac Enzymes: No results for input(s): CKTOTAL, CKMB, CKMBINDEX, TROPONINI in the last 168 hours. BNP (last 3 results) No results for input(s): PROBNP in the last 8760 hours. HbA1C: No results for input(s): HGBA1C in the last 72 hours. CBG: No results for input(s): GLUCAP in the last 168 hours. Lipid Profile: No results for input(s): CHOL, HDL, LDLCALC, TRIG, CHOLHDL, LDLDIRECT in the last 72 hours. Thyroid Function Tests: Recent Labs    07/18/21 1351  TSH 5.639*  FREET4 1.01   Anemia Panel: No results for input(s): VITAMINB12, FOLATE, FERRITIN, TIBC, IRON, RETICCTPCT in the last 72 hours. Sepsis Labs: No results for input(s): PROCALCITON, LATICACIDVEN in the last 168 hours.  Recent Results (from the past 240 hour(s))  Resp Panel by RT-PCR (Flu A&B, Covid) Nasopharyngeal Swab     Status: Abnormal   Collection Time: 07/18/21  3:51 PM   Specimen: Nasopharyngeal Swab; Nasopharyngeal(NP) swabs in vial transport medium  Result Value Ref Range Status   SARS Coronavirus 2 by RT PCR POSITIVE (A) NEGATIVE Final    Comment: (NOTE) SARS-CoV-2 target nucleic acids are DETECTED.  The SARS-CoV-2 RNA is generally detectable in upper respiratory specimens during the acute phase of infection. Positive results are indicative of the presence of the identified virus, but do not rule out bacterial infection or co-infection with other pathogens not detected by the test. Clinical correlation with patient history and other diagnostic information is necessary to determine patient infection status. The expected  result is Negative.  Fact Sheet for Patients: EntrepreneurPulse.com.au  Fact Sheet for Healthcare Providers: IncredibleEmployment.be  This test is not yet approved or cleared by the [place] FDA and  has been authorized for detection and/or diagnosis of SARS-CoV-2 by FDA under an Emergency Use Authorization (EUA).  This EUA will remain in effect (meaning this test can be used) for the duration of  the COVID-19 declaration under Section 564(b)(1) of the A ct, 21 U.S.C. section 360bbb-3(b)(1), unless the authorization is terminated or revoked sooner.     Influenza A by PCR NEGATIVE NEGATIVE Final   Influenza B by PCR NEGATIVE NEGATIVE Final    Comment: (NOTE) The Xpert Xpress SARS-CoV-2/FLU/RSV plus assay is intended as an aid in the diagnosis of influenza from Nasopharyngeal swab specimens and should not be used as a sole basis for treatment. Nasal washings and aspirates are unacceptable for Xpert Xpress SARS-CoV-2/FLU/RSV testing.  Fact Sheet for Patients: EntrepreneurPulse.com.au  Fact Sheet for Healthcare Providers: IncredibleEmployment.be  This test is not yet approved or cleared by the [place] FDA and has been authorized for detection and/or diagnosis of SARS-CoV-2 by FDA under an Emergency Use Authorization (EUA). This EUA will remain in effect (  meaning this test can be used) for the duration of the COVID-19 declaration under Section 564(b)(1) of the Act, 21 U.S.C. section 360bbb-3(b)(1), unless the authorization is terminated or revoked.  Performed at [facility], [address], [place], [place] [address]          Radiology Studies: No results found.      Scheduled Meds:  apixaban  2.5 mg Oral BID   cholecalciferol  1,000 Units Oral Daily   feeding supplement  237 mL Oral TID BM   ferrous sulfate  325 mg Oral Q breakfast   finasteride  5 mg Oral Daily    furosemide  10 mg Oral Daily   mirtazapine  15 mg Oral QHS   multivitamin with minerals  1 tablet Oral Daily   nutrition supplement (JUVEN)  1 packet Oral BID BM   polyethylene glycol  17 g Oral Daily   rosuvastatin  20 mg Oral QHS   senna-docusate  2 tablet Oral BID   Continuous Infusions:  sodium chloride Stopped (07/20/21 1215)   remdesivir 100 mg in NS 100 mL Stopped (07/20/21 1107)     LOS: 1 day    Time spent: 27 minutes    Sharen Hones, MD [facility]   To contact the attending provider between 7A-7P or the covering provider during after hours 7P-7A, please log into the web site [email or web address] and access using universal [place] password for that web site. If you do not have the password, please call the hospital operator.  07/21/2021, 10:05 AM

## 2021-07-21 NOTE — TOC Progression Note (Signed)
Transition of Care [facility]) - Progression Note    Patient Details  Name: [name] MRN: [id number] Date of Birth: 1933/04/04  Transition of Care [facility]) CM/SW [place], LCSW Phone Number: 07/21/2021, 9:08 AM  Clinical Narrative:  CSW spoke with pt and e states he doesn't know what tomorrow brings he said he can't decide yet if he wants to go to rehab. CSW multiple times explained that if pt declines snf pt could potentially dc home sooner then later and pt states he isn't ready to go home. Pt continued to states he doesn't know what his dc plan will be. Pt did give CSW verbal permission to call his son.    Expected Discharge Plan: [place] Barriers to Discharge: Continued Medical Work up  Expected Discharge Plan and Services Expected Discharge Plan: [place] arrangements for the past 2 months: Single Family Home                                       Social Determinants of Health (SDOH) Interventions    Readmission Risk Interventions No flowsheet data found.

## 2021-07-22 ENCOUNTER — Ambulatory Visit: Payer: Medicare Other | Admitting: Physician Assistant

## 2021-07-22 NOTE — Progress Notes (Signed)
PROGRESS NOTE    Louis Reid  JJK:[id number] DOB: 1933-05-07 DOA: 07/18/2021 PCP: [name], MD    Brief Narrative:  Louis Reid is a 88 y.o. male with medical history significant of coronary disease, heart failure ejection fraction 20%, paroxysmal atrial fibrillation, chronic kidney disease stage IIIb, BPH, hypertension, dyslipidemia, PE, malnutrition who presented with generalized weakness and poor appetite.  Multiple recent hospitalizations with similar symptoms and failure to thrive. He was able to live at home independently prior to admission, he was found to have a COVID at admission.   Assessment & Plan:   Principal Problem:   Failure to thrive in adult Active Problems:   Essential hypertension   Ischemic cardiomyopathy   Chronic systolic CHF (congestive heart failure) (HCC)   Protein-calorie malnutrition, severe   Paroxysmal atrial fibrillation (HCC)   Pressure injury of skin   COVID-19 virus infection  COVID virus infection. Failure to thrive. Generalized weakness secondary to COVID infection. Severe protein calorie malnutrition. Patient had a functional decline over the last 2 or 3 days, this is presumably secondary to COVID infection.   He does not have acute hypoxemia, he has completed remdesivir. Patient has been seen by PT/OT, recommended nursing home placement.   Chronic kidney disease stage IIIb Acute kidney injury ruled out. Conditions are stable.   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  Ejection fraction 20 to 25%. Continue lower dose Lasix  Paroxysmal atrial fibrillation. Continue Eliquis.   Chronic thrombocytopenia. Stable.  Essential hypertension.       DVT prophylaxis: Eliquis Code Status: DNR Family Communication:  Disposition Plan:      Status is: Inpatient due to unsafe discharge.       I/O last 3 completed shifts: In: -  Out: 2175 [Urine:2175] No intake/output data  recorded.    Subjective: Patient was able to walk a few steps by himself, but still with significant weakness in general. Denies any short of breath or cough.  Objective: Vitals:   07/22/21 0009 07/22/21 0531 07/22/21 0828 07/22/21 1223  BP: 119/71 126/82 129/86 129/82  Pulse: 80 70 90 88  Resp: 16 16 18 17   Temp: 98.1 F (36.7 C) 98 F (36.7 C) 97.9 F (36.6 C) 98.1 F (36.7 C)  TempSrc: Oral Oral Oral Oral  SpO2: 98% 100% 98% 98%  Weight:      Height:        Intake/Output Summary (Last 24 hours) at 07/22/2021 1239 Last data filed at 07/22/2021 0532 Gross per 24 hour  Intake --  Output 1675 ml  Net -1675 ml   Filed Weights   07/18/21 2041 07/19/21 0424 07/20/21 0445  Weight: 51.9 kg 54 kg 52 kg    Examination:  General exam: Appears calm and comfortable  Respiratory system: Clear to auscultation. Respiratory effort normal. Cardiovascular system: S1 & S2 heard, RRR. No JVD, murmurs, rubs, gallops or clicks. No pedal edema. Gastrointestinal system: Abdomen is nondistended, soft and nontender. No organomegaly or masses felt. Normal bowel sounds heard. Central nervous system: Alert and oriented. No focal neurological deficits. Extremities: Symmetric 5 x 5 power. Skin: No rashes, lesions or ulcers Psychiatry: Judgement and insight appear normal. Mood & affect appropriate.     Data Reviewed: I have personally reviewed following labs and imaging studies  CBC: Recent Labs  Lab 07/18/21 1351  WBC 7.0  HGB 13.6  HCT 42.5  MCV 99.1  PLT 371*   Basic Metabolic Panel: Recent Labs  Lab 07/18/21 1351  07/19/21 0552  NA 141 143  K 3.8 3.8  CL 110 111  CO2 21* 25  GLUCOSE 170* 91  BUN 37* 35*  CREATININE 1.52* 1.28*  CALCIUM 9.2 8.8*   GFR: Estimated Creatinine Clearance: 30.5 mL/min (A) (by C-G formula based on SCr of 1.28 mg/dL (H)). Liver Function Tests: Recent Labs  Lab 07/18/21 1351  AST 42*  ALT 35  ALKPHOS 93  BILITOT 0.9  PROT 6.0*  ALBUMIN  3.5   No results for input(s): LIPASE, AMYLASE in the last 168 hours. No results for input(s): AMMONIA in the last 168 hours. Coagulation Profile: No results for input(s): INR, PROTIME in the last 168 hours. Cardiac Enzymes: No results for input(s): CKTOTAL, CKMB, CKMBINDEX, TROPONINI in the last 168 hours. BNP (last 3 results) No results for input(s): PROBNP in the last 8760 hours. HbA1C: No results for input(s): HGBA1C in the last 72 hours. CBG: No results for input(s): GLUCAP in the last 168 hours. Lipid Profile: No results for input(s): CHOL, HDL, LDLCALC, TRIG, CHOLHDL, LDLDIRECT in the last 72 hours. Thyroid Function Tests: No results for input(s): TSH, T4TOTAL, FREET4, T3FREE, THYROIDAB in the last 72 hours. Anemia Panel: No results for input(s): VITAMINB12, FOLATE, FERRITIN, TIBC, IRON, RETICCTPCT in the last 72 hours. Sepsis Labs: No results for input(s): PROCALCITON, LATICACIDVEN in the last 168 hours.  Recent Results (from the past 240 hour(s))  Resp Panel by RT-PCR (Flu A&B, Covid) Nasopharyngeal Swab     Status: Abnormal   Collection Time: 07/18/21  3:51 PM   Specimen: Nasopharyngeal Swab; Nasopharyngeal(NP) swabs in vial transport medium  Result Value Ref Range Status   SARS Coronavirus 2 by RT PCR POSITIVE (A) NEGATIVE Final    Comment: (NOTE) SARS-CoV-2 target nucleic acids are DETECTED.  The SARS-CoV-2 RNA is generally detectable in upper respiratory specimens during the acute phase of infection. Positive results are indicative of the presence of the identified virus, but do not rule out bacterial infection or co-infection with other pathogens not detected by the test. Clinical correlation with patient history and other diagnostic information is necessary to determine patient infection status. The expected result is Negative.  Fact Sheet for Patients: EntrepreneurPulse.com.au  Fact Sheet for Healthcare  Providers: IncredibleEmployment.be  This test is not yet approved or cleared by the [place] FDA and  has been authorized for detection and/or diagnosis of SARS-CoV-2 by FDA under an Emergency Use Authorization (EUA).  This EUA will remain in effect (meaning this test can be used) for the duration of  the COVID-19 declaration under Section 564(b)(1) of the A ct, 21 U.S.C. section 360bbb-3(b)(1), unless the authorization is terminated or revoked sooner.     Influenza A by PCR NEGATIVE NEGATIVE Final   Influenza B by PCR NEGATIVE NEGATIVE Final    Comment: (NOTE) The Xpert Xpress SARS-CoV-2/FLU/RSV plus assay is intended as an aid in the diagnosis of influenza from Nasopharyngeal swab specimens and should not be used as a sole basis for treatment. Nasal washings and aspirates are unacceptable for Xpert Xpress SARS-CoV-2/FLU/RSV testing.  Fact Sheet for Patients: EntrepreneurPulse.com.au  Fact Sheet for Healthcare Providers: IncredibleEmployment.be  This test is not yet approved or cleared by the [place] FDA and has been authorized for detection and/or diagnosis of SARS-CoV-2 by FDA under an Emergency Use Authorization (EUA). This EUA will remain in effect (meaning this test can be used) for the duration of the COVID-19 declaration under Section 564(b)(1) of the Act, 21 U.S.C. section 360bbb-3(b)(1), unless the  authorization is terminated or revoked.  Performed at [facility], [address], [place],  [address]          Radiology Studies: No results found.      Scheduled Meds:  apixaban  2.5 mg Oral BID   vitamin C  500 mg Oral BID   cholecalciferol  1,000 Units Oral Daily   feeding supplement  237 mL Oral TID BM   ferrous sulfate  325 mg Oral Q breakfast   finasteride  5 mg Oral Daily   furosemide  10 mg Oral Daily   mirtazapine  15 mg Oral QHS   multivitamin with minerals  1  tablet Oral Daily   polyethylene glycol  17 g Oral Daily   rosuvastatin  20 mg Oral QHS   senna-docusate  2 tablet Oral BID   zinc sulfate  220 mg Oral Daily   Continuous Infusions:  sodium chloride Stopped (07/20/21 1215)     LOS: 2 days    Time spent: 25 minutes    Sharen Hones, MD [facility]   To contact the attending provider between 7A-7P or the covering provider during after hours 7P-7A, please log into the web site [email or web address] and access using universal [place] password for that web site. If you do not have the password, please call the hospital operator.  07/22/2021, 12:39 PM

## 2021-07-22 NOTE — TOC Progression Note (Addendum)
Transition of Care [facility]) - Progression Note    Patient Details  Name: Louis Reid MRN: [id number] Date of Birth: [birth year]  Transition of Care [facility]) CM/SW [place], LCSW Phone Number: 07/22/2021, 9:18 AM  Clinical Narrative:  [facility] will accept pt once quarantine is over. Can't admit to SNF until 10 days post positive test (2/10). Will notify pt and pt's Son.    Expected Discharge Plan: [place] Barriers to Discharge: Continued Medical Work up  Expected Discharge Plan and Services Expected Discharge Plan: [place] arrangements for the past 2 months: Single Family Home                                       Social Determinants of Health (SDOH) Interventions    Readmission Risk Interventions No flowsheet data found.

## 2021-07-22 NOTE — Progress Notes (Signed)
Physical Therapy Treatment Patient Details Name: Louis Reid MRN: [id number] DOB: Oct 09, 1932 Today's Date: 07/22/2021   History of Present Illness The patient is an 88 yo male that presented to the ED for weakness, poor appetite, inability to care for himself. PMH of recent hospital admissions, coronary disease, heart failure ejection fraction 20%, paroxysmal atrial fibrillation, chronic kidney disease stage IIIb, BPH, hypertension, dyslipidemia, PE, was on hospice but transitioned to palliative, MI, AICD.    PT Comments    Pt received in bed, anxious to get moving. Pt completed supine<>sit with supervision. Transfers from various surfaces with supervision. Gait training in room with support of RW x 92ft with CGA for safety. Pt completed seated strengthening exercises for B LE's with good tolerance. Might attempt gait training outside of room next visit if able. Pt continues to make great progress with PT.    Recommendations for follow up therapy are one component of a multi-disciplinary discharge planning process, led by the attending physician.  Recommendations may be updated based on patient status, additional functional criteria and insurance authorization.  Follow Up Recommendations  Skilled nursing-short term rehab (<3 hours/day)     Assistance Recommended at Discharge Intermittent Supervision/Assistance  Patient can return home with the following A little help with walking and/or transfers   Equipment Recommendations  None recommended by PT    Recommendations for Other Services       Precautions / Restrictions Precautions Precautions: Fall Restrictions Weight Bearing Restrictions: No     Mobility  Bed Mobility Overal bed mobility: Needs Assistance Bed Mobility: Supine to Sit, Sit to Supine     Supine to sit: Supervision, HOB elevated Sit to supine: Supervision        Transfers Overall transfer level: Needs assistance Equipment used: None Transfers: Sit  to/from Stand Sit to Stand: Supervision Stand pivot transfers: Supervision         General transfer comment: Good demonstration of safety awareness    Ambulation/Gait Ambulation/Gait assistance: Min guard Gait Distance (Feet): 60 Feet Assistive device: Rolling walker (2 wheels) Gait Pattern/deviations: WFL(Within Functional Limits)       General Gait Details:  (Pt ambulated in room in very tight spaces with multiple turns involved without LOB)   Stairs             Wheelchair Mobility    Modified Rankin (Stroke Patients Only)       Balance Overall balance assessment: Needs assistance Sitting-balance support: Feet supported Sitting balance-Leahy Scale: Good     Standing balance support: Bilateral upper extremity supported Standing balance-Leahy Scale: Fair Standing balance comment:  (Pt able to ambulate safely with RW +/or support of IV pole)                            Cognition Arousal/Alertness: Awake/alert Behavior During Therapy: WFL for tasks assessed/performed Overall Cognitive Status: Within Functional Limits for tasks assessed                                 General Comments: Very pleasant and motivated to return to [facility]        Exercises General Exercises - Lower Extremity Ankle Circles/Pumps: AROM, Both, 20 reps Long Arc Quad: AROM, Both, 10 reps Hip Flexion/Marching: AROM, Both, 10 reps    General Comments General comments (skin integrity, edema, etc.): Pt educated on POC and importance of OOB activity. Pt is very  motivated to increase strength and mobility daily in order to return home instead of SNF      Pertinent Vitals/Pain Pain Assessment Pain Assessment: No/denies pain    Home Living                          Prior Function            PT Goals (current goals can now be found in the care plan section) Acute Rehab PT Goals Patient Stated Goal: to go home Progress towards PT goals:  Progressing toward goals    Frequency    Min 2X/week      PT Plan Current plan remains appropriate    Co-evaluation              AM-PAC PT "6 Clicks" Mobility   Outcome Measure  Help needed turning from your back to your side while in a flat bed without using bedrails?: A Little Help needed moving from lying on your back to sitting on the side of a flat bed without using bedrails?: A Little Help needed moving to and from a bed to a chair (including a wheelchair)?: A Little Help needed standing up from a chair using your arms (e.g., wheelchair or bedside chair)?: A Little Help needed to walk in hospital room?: A Little Help needed climbing 3-5 steps with a railing? : A Little 6 Click Score: 18    End of Session Equipment Utilized During Treatment: Gait belt Activity Tolerance: Patient tolerated treatment well Patient left: in bed;with call bell/phone within reach Nurse Communication: Mobility status PT Visit Diagnosis: Other abnormalities of gait and mobility (R26.89);Difficulty in walking, not elsewhere classified (R26.2);Muscle weakness (generalized) (M62.81)     Time: 1315-1400 PT Time Calculation (min) (ACUTE ONLY): 45 min  Charges:  $Gait Training: 8-22 mins $Therapeutic Exercise: 8-22 mins $Therapeutic Activity: 8-22 mins                    Mikel Cella, PTA   Josie Dixon 07/22/2021, 2:16 PM

## 2021-07-22 NOTE — Progress Notes (Addendum)
Occupational Therapy Treatment Patient Details Name: Louis Reid MRN: [id number] DOB: [birth year] Today's Date: 07/22/2021   History of present illness The patient is an 88 yo male that presented to the ED for weakness, poor appetite, inability to care for himself. PMH of recent hospital admissions, coronary disease, heart failure ejection fraction 20%, paroxysmal atrial fibrillation, chronic kidney disease stage IIIb, BPH, hypertension, dyslipidemia, PE, was on hospice but transitioned to palliative, MI, AICD.   OT comments  Mr Lamadrid was seen for OT treatment on this date. Upon arrival to room pt reclined in bed, cites fatigue however agreeable to tx. Pt requires CGA + HHA for walk in shower t/f and tooth brushing standing sinkside - pt tolerates ~5 min prior to requesting need to sit. MIN cues for locating hygiene items on counter. Pt making good progress toward goals. Pt continues to benefit from skilled OT services, will continue to follow POC. Discharge recommendation remains appropriate as pt demonstrates poor tolerance and has limited assistance at home. Plan to re-assess next session. Pt would be safe to d/c home with increased supervision.     Recommendations for follow up therapy are one component of a multi-disciplinary discharge planning process, led by the attending physician.  Recommendations may be updated based on patient status, additional functional criteria and insurance authorization.    Follow Up Recommendations  Skilled nursing-short term rehab (<3 hours/day)    Assistance Recommended at Discharge Intermittent Supervision/Assistance  Patient can return home with the following  A little help with walking and/or transfers;A little help with bathing/dressing/bathroom;Assistance with cooking/housework;Assist for transportation;Help with stairs or ramp for entrance   Equipment Recommendations  BSC/3in1    Recommendations for Other Services      Precautions / Restrictions  Precautions Precautions: Fall Restrictions Weight Bearing Restrictions: No       Mobility Bed Mobility Overal bed mobility: Needs Assistance Bed Mobility: Supine to Sit, Sit to Supine     Supine to sit: Supervision, HOB elevated Sit to supine: Supervision        Transfers Overall transfer level: Needs assistance Equipment used: None Transfers: Sit to/from Stand Sit to Stand: Supervision                 Balance Overall balance assessment: Needs assistance Sitting-balance support: Feet supported Sitting balance-Leahy Scale: Good     Standing balance support: Single extremity supported, During functional activity Standing balance-Leahy Scale: Fair                             ADL either performed or assessed with clinical judgement   ADL Overall ADL's : Needs assistance/impaired                                       General ADL Comments: CGA + HHA for walk in shower t/f and tooth brushing standing sinkside - pt toelrates ~5 min prior to requesting need to sit.      Cognition Arousal/Alertness: Awake/alert Behavior During Therapy: WFL for tasks assessed/performed Overall Cognitive Status: Within Functional Limits for tasks assessed                                 General Comments: Very pleasant and motivated to return to PLOF  General Comments Pt educated on POC and importance of OOB activity. Pt is very motivated to increase strength and mobility daily in order to return home instead of SNF    Pertinent Vitals/ Pain       Pain Assessment Pain Assessment: No/denies pain   Frequency  Min 2X/week        Progress Toward Goals  OT Goals(current goals can now be found in the care plan section)  Progress towards OT goals: Progressing toward goals;OT to reassess next treatment  Acute Rehab OT Goals Patient Stated Goal: to improve OT Goal Formulation: With patient Time For Goal Achievement:  08/02/21 Potential to Achieve Goals: Good ADL Goals Pt Will Perform Lower Body Dressing: with modified independence;sit to/from stand Pt Will Transfer to Toilet: with modified independence;ambulating Additional ADL Goal #1: Pt will perform seated sponge bath with remote supervision for safety.  Plan Discharge plan remains appropriate;Frequency remains appropriate    Co-evaluation                 AM-PAC OT "6 Clicks" Daily Activity     Outcome Measure   Help from another person eating meals?: None Help from another person taking care of personal grooming?: A Little Help from another person toileting, which includes using toliet, bedpan, or urinal?: A Little Help from another person bathing (including washing, rinsing, drying)?: A Little Help from another person to put on and taking off regular upper body clothing?: None Help from another person to put on and taking off regular lower body clothing?: A Little 6 Click Score: 20    End of Session    OT Visit Diagnosis: Other abnormalities of gait and mobility (R26.89);Muscle weakness (generalized) (M62.81)   Activity Tolerance Patient tolerated treatment well   Patient Left in bed;with call bell/phone within reach;with bed alarm set   Nurse Communication          Time: 4403-4742 OT Time Calculation (min): 24 min  Charges: OT General Charges $OT Visit: 1 Visit OT Treatments $Self Care/Home Management : 23-37 mins  Dessie Coma, M.S. OTR/L  07/22/21, 3:43 PM  ascom [phone]

## 2021-07-22 NOTE — Progress Notes (Signed)
Palliative:  Conference with transition of care team related to patient condition, needs, goals of care, disposition.  Plan: At this point continue to treat the treatable but no CPR or intubation.  [place] for short-term rehab after quarantine, 10 days postpositive test which was 2/10.   Continue with [facility] outpatient palliative care.  No charge  Quinn Axe, NP  Palliative Medicine Team  Team phone [phone] Greater than 50% of this time was spent counseling and coordinating care related to the above assessment and plan.

## 2021-07-23 ENCOUNTER — Other Ambulatory Visit: Payer: Medicare Other | Admitting: Hospice

## 2021-07-23 DIAGNOSIS — I10 Essential (primary) hypertension: Secondary | ICD-10-CM

## 2021-07-23 DIAGNOSIS — R531 Weakness: Secondary | ICD-10-CM

## 2021-07-23 DIAGNOSIS — I480 Paroxysmal atrial fibrillation: Secondary | ICD-10-CM

## 2021-07-23 DIAGNOSIS — N1832 Chronic kidney disease, stage 3b: Secondary | ICD-10-CM

## 2021-07-23 DIAGNOSIS — E43 Unspecified severe protein-calorie malnutrition: Secondary | ICD-10-CM

## 2021-07-23 DIAGNOSIS — E785 Hyperlipidemia, unspecified: Secondary | ICD-10-CM

## 2021-07-23 NOTE — Assessment & Plan Note (Addendum)
Did better with occupational therapy yesterday afternoon and both physical therapy and Occupational Therapy changed their recommendations to home with home health.  As the patient is COVID-positive will take a few days to come and to see.  Son able to come and take him home today.

## 2021-07-23 NOTE — Assessment & Plan Note (Signed)
Continue low-dose Lasix

## 2021-07-23 NOTE — Care Management Important Message (Signed)
Important Message  Patient Details  Name: Louis Reid MRN: [id number] Date of Birth: 23-Apr-1933   Medicare Important Message Given:  Yes     Juliann Pulse A Journey Ratterman 07/23/2021, 8:24 AM

## 2021-07-23 NOTE — Assessment & Plan Note (Addendum)
Last creatinine 1.36

## 2021-07-23 NOTE — Progress Notes (Addendum)
Physical Therapy Treatment Patient Details Name: AMARIE TARTE MRN: [id number] DOB: 18-Jul-1932 Today's Date: 07/23/2021   History of Present Illness The patient is an 88 yo male that presented to the ED for weakness, poor appetite, inability to care for himself. PMH of recent hospital admissions, coronary disease, heart failure ejection fraction 20%, paroxysmal atrial fibrillation, chronic kidney disease stage IIIb, BPH, hypertension, dyslipidemia, PE, was on hospice but transitioned to palliative, MI, AICD.    PT Comments    Pt awake and alert resting in bed upon PT entrance into room. He is very welcoming to PT today, and denies/has no complaints of pain today. He is able to perform all bed mobility w/ SUPERVISION and the HOB elevated. Once seated at EOB Pt is able to perform sit to stand w/ SUPERVISION, but utilizes pushing through bed w/ UE to help stand. Once standing Pt was able to ambulate ~30ft w/ CGA using RW and then returned to EOB to perform 5x sit <> stand. This cycle was performed two times total w/ ~45sec rest once 5x S<>S was completed before 2nd bout of ambulation. Pt reported some increased in fatigue following the completion of both cycles. Before exiting room, he expressed w/ PT that his goal is "to go home and be over this COVID, so that my son can come visit me". Pt will benefit from continued skilled PT to improve LE strength, mobility, gait, and restore PLOF. Current discharge recommendation update to either HHPT or Outpatient PT is appropriate, due to improved current functional status and level of assistance required by the patient to ensure safety and improve overall function.    Recommendations for follow up therapy are one component of a multi-disciplinary discharge planning process, led by the attending physician.  Recommendations may be updated based on patient status, additional functional criteria and insurance authorization.  Follow Up Recommendations    HHPT/outpatient PT as able      Assistance Recommended at Discharge Intermittent Supervision/Assistance  Patient can return home with the following A little help with walking and/or transfers;Assistance with cooking/housework   Equipment Recommendations  None recommended by PT    Recommendations for Other Services       Precautions / Restrictions Precautions Precautions: Fall Restrictions Weight Bearing Restrictions: No     Mobility  Bed Mobility Overal bed mobility: Needs Assistance Bed Mobility: Supine to Sit, Sit to Supine     Supine to sit: Supervision, HOB elevated Sit to supine: Supervision        Transfers Overall transfer level: Needs assistance Equipment used: Rolling walker (2 wheels) Transfers: Sit to/from Stand Sit to Stand: Supervision                Ambulation/Gait Ambulation/Gait assistance: Min guard Gait Distance (Feet): 40 Feet (2 bouts of ~59ft w/ 5x sit <> stand inbetween) Assistive device: Rolling walker (2 wheels) Gait Pattern/deviations: WFL(Within Functional Limits) Gait velocity: decreased         Stairs             Wheelchair Mobility    Modified Rankin (Stroke Patients Only)       Balance Overall balance assessment: Needs assistance Sitting-balance support: Feet supported Sitting balance-Leahy Scale: Good     Standing balance support: Single extremity supported, During functional activity Standing balance-Leahy Scale: Fair                              Cognition Arousal/Alertness: Awake/alert Behavior  During Therapy: WFL for tasks assessed/performed Overall Cognitive Status: Within Functional Limits for tasks assessed                                 General Comments: Very pleasant and motivated to return to [facility]        Exercises Other Exercises Other Exercises: 5x sit <> stand inbetween bouts of ambulation, w/ ~45sec rest after.    General Comments        Pertinent  Vitals/Pain Pain Assessment Pain Assessment: No/denies pain    Home Living                          Prior Function            PT Goals (current goals can now be found in the care plan section) Progress towards PT goals: Progressing toward goals    Frequency    Min 2X/week      PT Plan Current plan remains appropriate    Co-evaluation              AM-PAC PT "6 Clicks" Mobility   Outcome Measure  Help needed turning from your back to your side while in a flat bed without using bedrails?: A Little Help needed moving from lying on your back to sitting on the side of a flat bed without using bedrails?: A Little Help needed moving to and from a bed to a chair (including a wheelchair)?: A Little Help needed standing up from a chair using your arms (e.g., wheelchair or bedside chair)?: A Little Help needed to walk in hospital room?: A Little Help needed climbing 3-5 steps with a railing? : A Little 6 Click Score: 18    End of Session Equipment Utilized During Treatment: Gait belt Activity Tolerance: Patient tolerated treatment well Patient left: in bed;with call bell/phone within reach Nurse Communication: Mobility status PT Visit Diagnosis: Other abnormalities of gait and mobility (R26.89);Difficulty in walking, not elsewhere classified (R26.2);Muscle weakness (generalized) (M62.81)     Time: 0973-5329 PT Time Calculation (min) (ACUTE ONLY): 29 min  Charges:                         Jonnie Kind, SPT 07/23/2021, 11:36 AM

## 2021-07-23 NOTE — Assessment & Plan Note (Addendum)
Continue Eliquis for anticoagulation and stroke prevention.  (Prescription written for Eliquis.)

## 2021-07-23 NOTE — Progress Notes (Addendum)
°  Progress Note   Patient: Louis Reid JTT:[id number] DOB: [birth year] DOA: 07/18/2021     3 DOS: the patient was seen and examined on 07/23/2021    Assessment and Plan: * COVID-19 virus infection Patient diagnosed with COVID on 07/18/2020.  Patient completed remdesivir.  Generalized weakness Physical therapy recommending rehab  Failure to thrive in adult- (present on admission) Encouraged eating  Protein-calorie malnutrition, severe- (present on admission) Encouraged eating  Chronic systolic CHF (congestive heart failure) (HCC) Last EF 20 to 25%.  Patient on low-dose Lasix.  No current signs of heart failure  Essential hypertension- (present on admission) Continue low-dose Lasix  Paroxysmal atrial fibrillation ([place])- (present on admission) Continue Eliquis for anticoagulation and stroke prevention.  Stage 3b chronic kidney disease ([place])- (present on admission) Last creatinine 1.28.  Decubital ulcer Stage II sacrum, present on admission with partial thickness loss of dermis presenting as a shallow open injury with pink wound bed without slough.  Very small area of broken skin.  Hyperlipidemia, unspecified Continue Crestor        Subjective: Patient interested in going home.  Physical Exam: Vitals:   07/22/21 2016 07/23/21 0423 07/23/21 0500 07/23/21 0800  BP: 101/76 115/76  111/68  Pulse: 61 90  88  Resp: 17 14  15   Temp: (!) 97.5 F (36.4 C) 98.1 F (36.7 C)  98 F (36.7 C)  TempSrc:  Oral  Oral  SpO2: 98% 98%  97%  Weight:   50.9 kg   Height:       Physical Exam HENT:     Head: Normocephalic.     Mouth/Throat:     Pharynx: No oropharyngeal exudate.  Eyes:     General: Lids are normal.     Conjunctiva/sclera: Conjunctivae normal.  Cardiovascular:     Rate and Rhythm: Normal rate and regular rhythm.     Heart sounds: Normal heart sounds, S1 normal and S2 normal.  Pulmonary:     Breath sounds: Normal breath sounds. No decreased breath sounds,  wheezing, rhonchi or rales.  Abdominal:     Palpations: Abdomen is soft.     Tenderness: There is no abdominal tenderness.  Musculoskeletal:     Right lower leg: No swelling.     Left lower leg: No swelling.  Skin:    General: Skin is warm.     Comments: Chronic lower extremity skin discoloration  Neurological:     Mental Status: He is alert and oriented to person, place, and time.     Data Reviewed: Laboratory and radiological data reviewed during the hospital course.  Last creatinine 1.28.  COVID-positive on 07/18/2021.  Family Communication: Updated son on the phone  Disposition: Status is: Inpatient Remains inpatient appropriate because: Patient is still in [place] isolation  Planned Discharge Destination: Rehab The patient is wanting to go home but physical therapy still recommending rehab.  The patient will either need to get stronger while here or go out to rehab once COVID isolation ends.  Patient did a little bit better today with physical therapy.  Author: Loletha Grayer, MD 07/23/2021 2:19 PM  For on call review [email or web address].

## 2021-07-23 NOTE — Assessment & Plan Note (Signed)
Stage II sacrum, present on admission with partial thickness loss of dermis presenting as a shallow open injury with pink wound bed without slough.  Very small area of broken skin.

## 2021-07-23 NOTE — Assessment & Plan Note (Signed)
Encouraged eating. 

## 2021-07-23 NOTE — Progress Notes (Signed)
Occupational Therapy Treatment Patient Details Name: Louis Reid MRN: [id number] DOB: [birth year] Today's Date: 07/23/2021   History of present illness The patient is an 88 yo male that presented to the ED for weakness, poor appetite, inability to care for himself. PMH of recent hospital admissions, coronary disease, heart failure ejection fraction 20%, paroxysmal atrial fibrillation, chronic kidney disease stage IIIb, BPH, hypertension, dyslipidemia, PE, was on hospice but transitioned to palliative, MI, AICD.   OT comments  Louis Reid was seen for OT treatment on this date. Upon arrival to room pt reclined in bed, agreeable to tx. Pt MOD I don/doff B socks at bed level. SUPERVISION for in room mobility, no AD use to clean trash in room - including squatting x2 to pick trash up from floor with single UE support. SBA + RW for ~80 ft mobility in hallway. Extensive conversation on home routine/modifications. Pt making good progress toward goals. Pt continues to benefit from skilled OT services to maximize return to PLOF and minimize risk of future falls, injury, caregiver burden, and readmission. Will continue to follow POC. Discharge recommendation remains updated to [facility] to reflect pt progress.     Recommendations for follow up therapy are one component of a multi-disciplinary discharge planning process, led by the attending physician.  Recommendations may be updated based on patient status, additional functional criteria and insurance authorization.    Follow Up Recommendations  Home health OT    Assistance Recommended at Discharge Intermittent Supervision/Assistance  Patient can return home with the following  Assistance with cooking/housework;Help with stairs or ramp for entrance;Assist for transportation   Equipment Recommendations  BSC/3in1    Recommendations for Other Services      Precautions / Restrictions Precautions Precautions: Fall Restrictions Weight Bearing Restrictions:  No       Mobility Bed Mobility Overal bed mobility: Independent                  Transfers Overall transfer level: Needs assistance Equipment used: None Transfers: Sit to/from Stand Sit to Stand: Supervision                 Balance Overall balance assessment: Needs assistance Sitting-balance support: Feet supported Sitting balance-Leahy Scale: Good     Standing balance support: Single extremity supported, During functional activity Standing balance-Leahy Scale: Good                             ADL either performed or assessed with clinical judgement   ADL Overall ADL's : Needs assistance/impaired                                       General ADL Comments: MOD I don/doff B socks at bed level. SUPERVISION for in room mobility, no AD use to clean trash in room - including squatting x2 to pick trash up from floor with single UE support. SBA + RW for ~80 ft mobility in hallway.      Cognition Arousal/Alertness: Awake/alert Behavior During Therapy: WFL for tasks assessed/performed Overall Cognitive Status: Within Functional Limits for tasks assessed                                 General Comments: requires extensive education on protocol for isolation pcns  Pertinent Vitals/ Pain       Pain Assessment Pain Assessment: No/denies pain   Frequency  Min 2X/week        Progress Toward Goals  OT Goals(current goals can now be found in the care plan section)  Progress towards OT goals: Progressing toward goals  Acute Rehab OT Goals Patient Stated Goal: to go home OT Goal Formulation: With patient Time For Goal Achievement: 08/02/21 Potential to Achieve Goals: Good ADL Goals Pt Will Perform Lower Body Dressing: with modified independence;sit to/from stand Pt Will Transfer to Toilet: with modified independence;ambulating Additional ADL Goal #1: Pt will perform seated sponge bath with  remote supervision for safety.  Plan Discharge plan needs to be updated;Frequency remains appropriate    Co-evaluation                 AM-PAC OT "6 Clicks" Daily Activity     Outcome Measure   Help from another person eating meals?: None Help from another person taking care of personal grooming?: A Little Help from another person toileting, which includes using toliet, bedpan, or urinal?: A Little Help from another person bathing (including washing, rinsing, drying)?: A Little Help from another person to put on and taking off regular upper body clothing?: None Help from another person to put on and taking off regular lower body clothing?: None 6 Click Score: 21    End of Session    OT Visit Diagnosis: Other abnormalities of gait and mobility (R26.89);Muscle weakness (generalized) (M62.81)   Activity Tolerance Patient tolerated treatment well   Patient Left in bed;with call bell/phone within reach;with bed alarm set   Nurse Communication          Time: 9292-4462 OT Time Calculation (min): 29 min  Charges: OT General Charges $OT Visit: 1 Visit OT Treatments $Self Care/Home Management : 23-37 mins  Dessie Coma, M.S. OTR/L  07/23/21, 3:37 PM  ascom [phone]

## 2021-07-23 NOTE — Assessment & Plan Note (Signed)
Patient diagnosed with COVID on 07/18/2020.  Patient completed remdesivir.

## 2021-07-23 NOTE — Assessment & Plan Note (Signed)
Continue Crestor 

## 2021-07-23 NOTE — Assessment & Plan Note (Signed)
Last EF 20 to 25%.  Patient on low-dose Lasix.  No current signs of heart failure

## 2021-07-23 NOTE — Progress Notes (Signed)
Nutrition Follow-up  DOCUMENTATION CODES:   Severe malnutrition in context of chronic illness, Underweight  INTERVENTION:   -Continue with regular diet -Continue 500 mg vitamin C BID -Continue 220 mg zinc sulfate daily x 14 days -Continue Ensure Enlive po TID, each supplement provides 350 kcal and 20 grams of protein.  -Continue MVI with minerals daily -Mighty Shake TID, each supplement provides 220 kcals and 6 grams protein  NUTRITION DIAGNOSIS:   Severe Malnutrition related to chronic illness (CHF) as evidenced by percent weight loss, severe fat depletion, severe muscle depletion.  Ongoing  GOAL:   Patient will meet greater than or equal to 90% of their needs  Unmet  MONITOR:   PO intake, Supplement acceptance, Labs, Weight trends, Skin, I & O's  REASON FOR ASSESSMENT:   Consult Assessment of nutrition requirement/status  ASSESSMENT:   88 y.o. male with medical history of CAD, heart failure ejection fraction 20%, afib, stage 3 CKD, BPH, HTN, dyslipidemia, PE, and malnutrition. He presented to the ED due to generalized weakness and poor appetite. He has had multiple recent hospitalizations for the same. In the ED he was found to be COVID positive.  Reviewed I/O's: -200 ml x 24 hours and -2.6 L since admission  UOP: 200 ml x 24 hours   Pt unavailable at time of visit. Attempted to speak with pt via call to hospital room phone, however, unable to reach.   Pt remains with poor oral intake. Noted meal completions 20%. Pt diet was liberalized to regular last visit due to malnutrition and poor oral intake. Pt is accepting 1-2 Ensure supplements per day.   Per TOC notes, pt is debating going home with home health vs discharge to SNF.   Medications reviewed and include lasix, remeron, miralax, and senokot.   Labs reviewed.   Diet Order:   Diet Order             Diet regular Room service appropriate? Yes; Fluid consistency: Thin  Diet effective now                    EDUCATION NEEDS:   Education needs have been addressed  Skin:  Skin Assessment: Skin Integrity Issues: Skin Integrity Issues:: Stage II Stage II: sacrum  Last BM:  07/21/21  Height:   Ht Readings from Last 1 Encounters:  07/18/21 5\' 11"  (1.803 m)    Weight:   Wt Readings from Last 1 Encounters:  07/23/21 50.9 kg    Ideal Body Weight:  78.2 kg  BMI:  Body mass index is 15.65 kg/m.  Estimated Nutritional Needs:   Kcal:  1900-2100  Protein:  90-105 grams  Fluid:  > 1.9 L    Loistine Chance, RD, LDN, [place] Registered Dietitian II Certified Diabetes Care and Education Specialist Please refer to [facility] for RD and/or RD on-call/weekend/after hours pager

## 2021-07-23 NOTE — Progress Notes (Signed)
° °  Progress Note   Date: 07/23/2021  Patient Name: Louis Reid        MRN#: [id number]   Clarification of the diagnosis of pressure ulcer(s):  Documented in today's note.

## 2021-07-24 DIAGNOSIS — L89152 Pressure ulcer of sacral region, stage 2: Secondary | ICD-10-CM

## 2021-07-24 LAB — CBC
HCT: 37.3 % — ABNORMAL LOW (ref 39.0–52.0)
Hemoglobin: 12.2 g/dL — ABNORMAL LOW (ref 13.0–17.0)
MCH: 30.7 pg (ref 26.0–34.0)
MCHC: 32.7 g/dL (ref 30.0–36.0)
MCV: 94 fL (ref 80.0–100.0)
Platelets: 106 10*3/uL — ABNORMAL LOW (ref 150–400)
RBC: 3.97 MIL/uL — ABNORMAL LOW (ref 4.22–5.81)
RDW: 14.7 % (ref 11.5–15.5)
WBC: 4.8 10*3/uL (ref 4.0–10.5)
nRBC: 0 % (ref 0.0–0.2)

## 2021-07-24 LAB — BASIC METABOLIC PANEL
Anion gap: 5 (ref 5–15)
BUN: 38 mg/dL — ABNORMAL HIGH (ref 8–23)
CO2: 25 mmol/L (ref 22–32)
Calcium: 8.9 mg/dL (ref 8.9–10.3)
Chloride: 111 mmol/L (ref 98–111)
Creatinine, Ser: 1.36 mg/dL — ABNORMAL HIGH (ref 0.61–1.24)
GFR, Estimated: 51 mL/min — ABNORMAL LOW (ref >60)
Glucose, Bld: 79 mg/dL (ref 70–99)
Potassium: 3.8 mmol/L (ref 3.5–5.1)
Sodium: 141 mmol/L (ref 135–145)

## 2021-07-24 NOTE — TOC Progression Note (Signed)
Transition of Care [facility]) - Progression Note    Patient Details  Name: Louis Reid MRN: [id number] Date of Birth: 09-Jul-1932  [facility]) CM/SW Contact  Eileen Stanford, LCSW Phone Number: 07/24/2021, 11:28 AM  Clinical Narrative:   Plan is now for pt to dc home with hh.  [facility] can not take. [facility] is reviewing. [facility] can not take. [facility] can not take.     Expected Discharge Plan: [place] Barriers to Discharge: Continued Medical Work up  Expected Discharge Plan and Services Expected Discharge Plan: [place] arrangements for the past 2 months: Single Family Home                                       Social Determinants of Health (SDOH) Interventions    Readmission Risk Interventions No flowsheet data found.

## 2021-07-24 NOTE — TOC Progression Note (Signed)
Transition of Care [facility]) - Progression Note    Patient Details  Name: Louis Reid MRN: [id number] Date of Birth: [birth year]  Transition of Care [facility]) CM/SW [place], LCSW Phone Number: 07/24/2021, 3:23 PM  Clinical Narrative:   [place] will service pt. Anticipated dc tomorrow.    Expected Discharge Plan: [place] Barriers to Discharge: Continued Medical Work up  Expected Discharge Plan and Services Expected Discharge Plan: [place] arrangements for the past 2 months: Single Family Home                                       Social Determinants of Health (SDOH) Interventions    Readmission Risk Interventions No flowsheet data found.

## 2021-07-24 NOTE — Progress Notes (Signed)
Progress Note   Patient: Louis Reid XJO:[id number] DOB: 05-02-1933 DOA: 07/18/2021     4 DOS: the patient was seen and examined on 07/24/2021    Assessment and Plan: * COVID-19 virus infection Patient diagnosed with COVID on 07/18/2020.  Patient completed remdesivir.  Generalized weakness Did better with occupational therapy yesterday afternoon and both physical therapy and Occupational Therapy changed their recommendations to home with home health.  As the patient is COVID-positive will take a few days to come and to see.  Hesitant on sending the patient home alone today.  His son will come into town tomorrow and take him home from the hospital tomorrow afternoon  Failure to thrive in adult- (present on admission) Encouraged eating  Protein-calorie malnutrition, severe- (present on admission) Encouraged eating  Chronic systolic CHF (congestive heart failure) (HCC) Last EF 20 to 25%.  Patient on low-dose Lasix.  No current signs of heart failure  Essential hypertension- (present on admission) Continue low-dose Lasix  Paroxysmal atrial fibrillation ([place])- (present on admission) Continue Eliquis for anticoagulation and stroke prevention.  Stage 3b chronic kidney disease ([place])- (present on admission) Today's creatinine 1.36  Decubital ulcer Stage II sacrum, present on admission with partial thickness loss of dermis presenting as a shallow open injury with pink wound bed without slough.  Very small area of broken skin.  Hyperlipidemia, unspecified Continue Crestor       Subjective: Patient feeling little bit better little stronger every day.  Initially came in with weakness and COVID-19 infection.  Physical Exam: Vitals:   07/23/21 1746 07/23/21 2110 07/24/21 0422 07/24/21 0842  BP: 102/79 107/64 (!) 116/56 126/79  Pulse: 79 67 65 68  Resp: 16 18 18 15   Temp: 98.1 F (36.7 C) 97.9 F (36.6 C) 98.6 F (37 C) 98.1 F (36.7 C)  TempSrc: Oral Oral Oral   SpO2: 98%  98% 98% 99%  Weight:      Height:       Physical Exam HENT:     Head: Normocephalic.     Mouth/Throat:     Pharynx: No oropharyngeal exudate.  Eyes:     General: Lids are normal.     Conjunctiva/sclera: Conjunctivae normal.  Cardiovascular:     Rate and Rhythm: Normal rate and regular rhythm.     Heart sounds: Normal heart sounds, S1 normal and S2 normal.  Pulmonary:     Breath sounds: Normal breath sounds. No decreased breath sounds, wheezing, rhonchi or rales.  Abdominal:     Palpations: Abdomen is soft.     Tenderness: There is no abdominal tenderness.  Musculoskeletal:     Right lower leg: No swelling.     Left lower leg: No swelling.  Skin:    General: Skin is warm.     Comments: Chronic lower extremity skin discoloration  Neurological:     Mental Status: He is alert and oriented to person, place, and time.     Data Reviewed: Today's creatinine up at 1.36 GFR 51.  Family Communication: Spoke with patient's son on the phone.  He will not be able to come into town until tomorrow afternoon because he has himself medical issues and needs to see the doctors today and tomorrow.  Disposition: Status is: Inpatient Remains inpatient appropriate because unsafe disposition home alone today.  We will send home tomorrow when son is available to help out.  Transitional care team working on trying to set up home health.  Since the patient does have COVID-19 infection he  likely will come out this week.  Planned Discharge Destination: Home with Home Health   Author: Loletha Grayer, MD 07/24/2021 12:54 PM  For on call review [email or web address].

## 2021-07-25 LAB — URINALYSIS, ROUTINE W REFLEX MICROSCOPIC
Bacteria, UA: NONE SEEN
Bilirubin Urine: NEGATIVE
Glucose, UA: NEGATIVE mg/dL
Hgb urine dipstick: NEGATIVE
Ketones, ur: NEGATIVE mg/dL
Leukocytes,Ua: NEGATIVE
Nitrite: NEGATIVE
Protein, ur: 30 mg/dL — AB
Specific Gravity, Urine: 1.017 (ref 1.005–1.030)
pH: 5 (ref 5.0–8.0)

## 2021-07-25 MED ORDER — ONDANSETRON HCL 4 MG PO TABS: 4.0000 mg | ORAL_TABLET | Freq: Three times a day (TID) | ORAL | 0 refills | Status: DC | PRN

## 2021-07-25 MED ORDER — ASCORBIC ACID 500 MG PO TABS: 500.0000 mg | ORAL_TABLET | Freq: Every day | ORAL | 0 refills | Status: DC

## 2021-07-25 MED ORDER — APIXABAN 2.5 MG PO TABS
2.5000 mg | ORAL_TABLET | Freq: Two times a day (BID) | ORAL | 0 refills | Status: DC
Start: 2021-07-25 — End: 2022-02-03

## 2021-07-25 MED ORDER — MIRTAZAPINE 15 MG PO TABS
15.0000 mg | ORAL_TABLET | Freq: Every day | ORAL | 0 refills | Status: DC
Start: 2021-07-25 — End: 2021-12-15

## 2021-07-25 NOTE — Progress Notes (Signed)
Pt discharged home.

## 2021-07-25 NOTE — Care Management Important Message (Signed)
Important Message  Patient Details  Name: Louis Reid MRN: [id number] Date of Birth: [birth year]   Medicare Important Message Given:  Yes  Reviewed with patient via room phone due to isolation status.  Copy of Medicare IM mailed to home address on file.    Dannette Barbara 07/25/2021, 8:52 AM

## 2021-07-25 NOTE — TOC Transition Note (Signed)
Transition of Care [facility]) - CM/SW Discharge Note   Patient Details  Name: Louis Reid MRN: [id number] Date of Birth: 05/01/1933  Transition of Care [facility]) CM/SW Contact:  Eileen Stanford, LCSW Phone Number: 07/25/2021, 11:10 AM   Clinical Narrative:    Confirmed with Corene Cornea with [facility] start of care will be 2/20. Pt's son is on his way from [place] to pick the pt up and take him home. No additional needs at this time.    Final next level of care: [employer] Barriers to Discharge: No Barriers Identified   Patient Goals and CMS Choice Patient states their goals for this hospitalization and ongoing recovery are:: still deciding on what he wants CMS Medicare.gov Compare Post Acute Care list provided to:: Patient Choice offered to / list presented to : Patient  Discharge Placement                Patient to be transferred to facility by: Son Name of family member notified: [name] Patient and family notified of of transfer: 07/25/21  Discharge Plan and Services                                     Social Determinants of Health (SDOH) Interventions     Readmission Risk Interventions No flowsheet data found.

## 2021-07-25 NOTE — Discharge Summary (Signed)
Physician Discharge Summary   Patient: Louis Reid MRN: [id number] DOB: December 30, 1932  Admit date:     07/18/2021  Discharge date: 07/25/21  Discharge Physician: Loletha Grayer   PCP: [name], MD   Recommendations at discharge:   Follow-up PCP 5 days  Discharge Diagnoses: Principal Problem:   COVID-19 virus infection Active Problems:   Generalized weakness   Protein-calorie malnutrition, severe   Failure to thrive in adult   Chronic systolic CHF (congestive heart failure) (HCC)   Essential hypertension   Stage 3b chronic kidney disease (HCC)   Paroxysmal atrial fibrillation ([place])   Hyperlipidemia, unspecified   Decubital ulcer    Hospital Course: The patient was admitted to the hospital on 07/18/2020 with weakness and found to have a COVID-19 virus infection.  He was not eating very much.  Initially physical therapy recommended rehab.  The patient wanted to go home.  He was able to get stronger during the hospital course and physical therapy recommended home with home health.  Home health set up.  Patient's son was able to come into town today to take him home.  The patient states that he is also followed by hospice at home.  Assessment and Plan: * COVID-19 virus infection Patient diagnosed with COVID on 07/18/2020.  Patient completed remdesivir.  Generalized weakness Did better with occupational therapy yesterday afternoon and both physical therapy and Occupational Therapy changed their recommendations to home with home health.  As the patient is COVID-positive will take a few days to come and to see.  Son able to come and take him home today.  Failure to thrive in adult- (present on admission) Encouraged eating  Protein-calorie malnutrition, severe- (present on admission) Encouraged eating  Chronic systolic CHF (congestive heart failure) (HCC) Last EF 20 to 25%.  Patient on low-dose Lasix.  No current signs of heart failure  Essential hypertension-  (present on admission) Continue low-dose Lasix  Paroxysmal atrial fibrillation ([place])- (present on admission) Continue Eliquis for anticoagulation and stroke prevention.  (Prescription written for Eliquis.)  Stage 3b chronic kidney disease ([place])- (present on admission) Last creatinine 1.36  Decubital ulcer Stage II sacrum, present on admission with partial thickness loss of dermis presenting as a shallow open injury with pink wound bed without slough.  Very small area of broken skin.  Hyperlipidemia, unspecified Continue Crestor           Consultants: Physical therapy Procedures performed: None Disposition: Home health Diet recommendation:  Cardiac diet  DISCHARGE MEDICATION: Allergies as of 07/25/2021   No Known Allergies      Medication List     TAKE these medications    acetaminophen 500 MG tablet Commonly known as: TYLENOL Take 1 tablet (500 mg total) by mouth every 6 (six) hours as needed for mild pain (or Fever >/= 101).   apixaban 2.5 MG Tabs tablet Commonly known as: Eliquis Take 1 tablet (2.5 mg total) by mouth 2 (two) times daily.   ascorbic acid 500 MG tablet Commonly known as: VITAMIN C Take 1 tablet (500 mg total) by mouth daily.   D3-1000 25 MCG (1000 UT) capsule Generic drug: Cholecalciferol Take 1,000 Units by mouth daily.   feeding supplement Liqd Take 237 mLs by mouth 3 (three) times daily between meals.   ferrous sulfate 325 (65 FE) MG tablet Take 325 mg by mouth daily with breakfast.   finasteride 5 MG tablet Commonly known as: PROSCAR Take 5 mg by mouth daily.   furosemide 20 MG tablet  Commonly known as: LASIX Take 10 mg by mouth daily.   ICAPS AREDS 2 PO Take 1 capsule by mouth 2 (two) times daily.   mirtazapine 15 MG tablet Commonly known as: REMERON Take 1 tablet (15 mg total) by mouth at bedtime.   ondansetron 4 MG tablet Commonly known as: Zofran Take 1 tablet (4 mg total) by mouth every 8 (eight) hours as needed  for nausea or vomiting.   rosuvastatin 20 MG tablet Commonly known as: CRESTOR TAKE 1 TABLET BY MOUTH EVERY DAY   senna-docusate 8.6-50 MG tablet Commonly known as: Senokot-S Take 1 tablet by mouth 2 (two) times daily.   zinc sulfate 220 (50 Zn) MG capsule Take 220 mg by mouth daily.        Follow-up Information     [name], MD Follow up in 5 day(s).   Specialty: Family Medicine Contact information: [place] [address] [phone]         [name], MD .   Specialty: [facility] Medicine Contact information: [place]  [phone]                 Discharge Exam: Danley Danker Weights   07/20/21 0445 07/23/21 0500 07/25/21 0500  Weight: 52 kg 50.9 kg 49.1 kg   Physical Exam HENT:     Head: Normocephalic.     Mouth/Throat:     Pharynx: No oropharyngeal exudate.  Eyes:     General: Lids are normal.     Conjunctiva/sclera: Conjunctivae normal.  Cardiovascular:     Rate and Rhythm: Normal rate and regular rhythm.     Heart sounds: Normal heart sounds, S1 normal and S2 normal.  Pulmonary:     Breath sounds: Normal breath sounds. No decreased breath sounds, wheezing, rhonchi or rales.  Abdominal:     Palpations: Abdomen is soft.     Tenderness: There is no abdominal tenderness.  Musculoskeletal:     Right lower leg: No swelling.     Left lower leg: No swelling.  Skin:    General: Skin is warm.     Comments: Chronic lower extremity skin discoloration  Neurological:     Mental Status: He is alert and oriented to person, place, and time.     Condition at discharge: stable  The results of significant diagnostics from this hospitalization (including imaging, microbiology, ancillary and laboratory) are listed below for reference.   Imaging Studies: DG Chest 2 View  Result Date: 07/18/2021 CLINICAL DATA:  Shortness of breath EXAM: CHEST - 2 VIEW COMPARISON:  02/18/2021 FINDINGS:  Left-sided implanted cardiac device. Post CABG changes. Stable mild cardiomegaly. Hyperinflated lungs. Trace bilateral pleural effusions. No pneumothorax. IMPRESSION: Trace bilateral pleural effusions. Electronically Signed   By: Davina Poke D.O.   On: 07/18/2021 16:19   CT HEAD WO CONTRAST (5MM)  Result Date: 07/18/2021 CLINICAL DATA:  Altered mental status EXAM: CT HEAD WITHOUT CONTRAST TECHNIQUE: Contiguous axial images were obtained from the base of the skull through the vertex without intravenous contrast. RADIATION DOSE REDUCTION: This exam was performed according to the departmental dose-optimization program which includes automated exposure control, adjustment of the mA and/or kV according to patient size and/or use of iterative reconstruction technique. COMPARISON:  05/12/2012 FINDINGS: Brain: No acute intracranial findings are seen. Cortical sulci are prominent. There is decreased density in the periventricular white matter. There are no epidural or subdural fluid collections. Vascular: Scattered arterial calcifications are seen. Skull: Unremarkable. Sinuses/Orbits: Unremarkable.  Other: None IMPRESSION: No acute intracranial findings are seen in noncontrast CT brain. Atrophy. Small-vessel disease. Electronically Signed   By: Elmer Picker M.D.   On: 07/18/2021 17:00    Microbiology: Results for orders placed or performed during the hospital encounter of 07/18/21  Resp Panel by RT-PCR (Flu A&B, Covid) Nasopharyngeal Swab     Status: Abnormal   Collection Time: 07/18/21  3:51 PM   Specimen: Nasopharyngeal Swab; Nasopharyngeal(NP) swabs in vial transport medium  Result Value Ref Range Status   SARS Coronavirus 2 by RT PCR POSITIVE (A) NEGATIVE Final    Comment: (NOTE) SARS-CoV-2 target nucleic acids are DETECTED.  The SARS-CoV-2 RNA is generally detectable in upper respiratory specimens during the acute phase of infection. Positive results are indicative of the presence of the  identified virus, but do not rule out bacterial infection or co-infection with other pathogens not detected by the test. Clinical correlation with patient history and other diagnostic information is necessary to determine patient infection status. The expected result is Negative.  Fact Sheet for Patients: EntrepreneurPulse.com.au  Fact Sheet for Healthcare Providers: IncredibleEmployment.be  This test is not yet approved or cleared by the [place] FDA and  has been authorized for detection and/or diagnosis of SARS-CoV-2 by FDA under an Emergency Use Authorization (EUA).  This EUA will remain in effect (meaning this test can be used) for the duration of  the COVID-19 declaration under Section 564(b)(1) of the A ct, 21 U.S.C. section 360bbb-3(b)(1), unless the authorization is terminated or revoked sooner.     Influenza A by PCR NEGATIVE NEGATIVE Final   Influenza B by PCR NEGATIVE NEGATIVE Final    Comment: (NOTE) The Xpert Xpress SARS-CoV-2/FLU/RSV plus assay is intended as an aid in the diagnosis of influenza from Nasopharyngeal swab specimens and should not be used as a sole basis for treatment. Nasal washings and aspirates are unacceptable for Xpert Xpress SARS-CoV-2/FLU/RSV testing.  Fact Sheet for Patients: EntrepreneurPulse.com.au  Fact Sheet for Healthcare Providers: IncredibleEmployment.be  This test is not yet approved or cleared by the [place] FDA and has been authorized for detection and/or diagnosis of SARS-CoV-2 by FDA under an Emergency Use Authorization (EUA). This EUA will remain in effect (meaning this test can be used) for the duration of the COVID-19 declaration under Section 564(b)(1) of the Act, 21 U.S.C. section 360bbb-3(b)(1), unless the authorization is terminated or revoked.  Performed at [facility], [address] [place], [place] [address]      Labs: CBC: Recent Labs  Lab 07/24/21 0512  WBC 4.8  HGB 12.2*  HCT 37.3*  MCV 94.0  PLT 644*   Basic Metabolic Panel: Recent Labs  Lab 07/19/21 0552 07/24/21 0512  NA 143 141  K 3.8 3.8  CL 111 111  CO2 25 25  GLUCOSE 91 79  BUN 35* 38*  CREATININE 1.28* 1.36*  CALCIUM 8.8* 8.9     Discharge time spent: greater than 30 minutes.  Signed: Loletha Grayer, MD [facility] 07/25/2021

## 2021-07-27 NOTE — Progress Notes (Signed)
[facility] RN NOTE  PATIENT NAME: Louis Reid DOB: 22-Aug-1932 MRN: [id number]  PRIMARY CARE PROVIDER: [name], MD  RESPONSIBLE PARTY: Wonda Horner (son) Acct ID - Guarantor Home Phone Work Phone Relationship Acct Type  [id number] DONNEL, VENUTO* [phone]  Self P/F     [address], [place], [place] [address]   Due to the COVID-19 crisis, this virtual check-in visit was done via telephone from my office and it was initiated and consent by this patient and or family.  RN telephonic encounter completed with patient's son-Wallace. Son is concerned that patient is on a decline. He is noticing that patient is much weaker and having difficulties performing ADLs. Son reports that patient will say that he is bathing himself and changing clothes but he suspects patient is not doing this. He remains ambulatory, but tires very easily. [name] states that patient had an incident in the [place] last week as patient told his son that the employees there had to assist him to a chair. He is not sure of all the details as the son lives out of town. Patient is also only taking in bites and sips of meals. He is wondering if it may be time for patient to go back on hospice. In person visit scheduled for 07/15/21 @ 12p. Updated provided to his palliative care NP, L. Eshiet.    (Duration of visit and documentation 15 minutes)    Daryl Eastern, RN

## 2021-07-29 ENCOUNTER — Emergency Department: Payer: Medicare Other

## 2021-07-29 ENCOUNTER — Emergency Department
Admission: EM | Admit: 2021-07-29 | Discharge: 2021-08-01 | Disposition: A | Discharge status: Skilled Nursing Facility | Payer: Medicare Other | Attending: Emergency Medicine | Admitting: Emergency Medicine

## 2021-07-29 ENCOUNTER — Other Ambulatory Visit: Payer: Self-pay

## 2021-07-29 DIAGNOSIS — M6281 Muscle weakness (generalized): Secondary | ICD-10-CM | POA: Insufficient documentation

## 2021-07-29 DIAGNOSIS — Z8616 Personal history of COVID-19: Secondary | ICD-10-CM | POA: Insufficient documentation

## 2021-07-29 DIAGNOSIS — Z20822 Contact with and (suspected) exposure to covid-19: Secondary | ICD-10-CM | POA: Diagnosis not present

## 2021-07-29 DIAGNOSIS — R778 Other specified abnormalities of plasma proteins: Secondary | ICD-10-CM | POA: Insufficient documentation

## 2021-07-29 DIAGNOSIS — Z7901 Long term (current) use of anticoagulants: Secondary | ICD-10-CM | POA: Insufficient documentation

## 2021-07-29 DIAGNOSIS — R7989 Other specified abnormal findings of blood chemistry: Secondary | ICD-10-CM | POA: Diagnosis not present

## 2021-07-29 DIAGNOSIS — R531 Weakness: Secondary | ICD-10-CM | POA: Diagnosis present

## 2021-07-29 DIAGNOSIS — D696 Thrombocytopenia, unspecified: Secondary | ICD-10-CM | POA: Insufficient documentation

## 2021-07-29 DIAGNOSIS — R600 Localized edema: Secondary | ICD-10-CM | POA: Diagnosis present

## 2021-07-29 DIAGNOSIS — N189 Chronic kidney disease, unspecified: Secondary | ICD-10-CM | POA: Insufficient documentation

## 2021-07-29 LAB — BASIC METABOLIC PANEL
Anion gap: 8 (ref 5–15)
BUN: 33 mg/dL — ABNORMAL HIGH (ref 8–23)
CO2: 23 mmol/L (ref 22–32)
Calcium: 9.2 mg/dL (ref 8.9–10.3)
Chloride: 110 mmol/L (ref 98–111)
Creatinine, Ser: 1.5 mg/dL — ABNORMAL HIGH (ref 0.61–1.24)
GFR, Estimated: 45 mL/min — ABNORMAL LOW (ref >60)
Glucose, Bld: 110 mg/dL — ABNORMAL HIGH (ref 70–99)
Potassium: 4.1 mmol/L (ref 3.5–5.1)
Sodium: 141 mmol/L (ref 135–145)

## 2021-07-29 LAB — CBC
HCT: 48.1 % (ref 39.0–52.0)
Hemoglobin: 15.4 g/dL (ref 13.0–17.0)
MCH: 30.7 pg (ref 26.0–34.0)
MCHC: 32 g/dL (ref 30.0–36.0)
MCV: 95.8 fL (ref 80.0–100.0)
Platelets: 146 10*3/uL — ABNORMAL LOW (ref 150–400)
RBC: 5.02 MIL/uL (ref 4.22–5.81)
RDW: 14.8 % (ref 11.5–15.5)
WBC: 9.1 10*3/uL (ref 4.0–10.5)
nRBC: 0 % (ref 0.0–0.2)

## 2021-07-29 LAB — TROPONIN I (HIGH SENSITIVITY)
Troponin I (High Sensitivity): 22 ng/L — ABNORMAL HIGH (ref <18)
Troponin I (High Sensitivity): 23 ng/L — ABNORMAL HIGH (ref <18)

## 2021-07-29 LAB — PROTIME-INR
INR: 1.3 — ABNORMAL HIGH (ref 0.8–1.2)
Prothrombin Time: 16.4 seconds — ABNORMAL HIGH (ref 11.4–15.2)

## 2021-07-29 LAB — RESP PANEL BY RT-PCR (FLU A&B, COVID) ARPGX2
Influenza A by PCR: NEGATIVE
Influenza B by PCR: NEGATIVE
SARS Coronavirus 2 by RT PCR: NEGATIVE

## 2021-07-29 LAB — LACTIC ACID, PLASMA: Lactic Acid, Venous: 1.9 mmol/L (ref 0.5–1.9)

## 2021-07-29 LAB — APTT: aPTT: 33 seconds (ref 24–36)

## 2021-07-29 LAB — BRAIN NATRIURETIC PEPTIDE: B Natriuretic Peptide: 466.1 pg/mL — ABNORMAL HIGH (ref 0.0–100.0)

## 2021-07-29 MED ORDER — ASCORBIC ACID 500 MG PO TABS
500.0000 mg | ORAL_TABLET | Freq: Every day | ORAL | Status: DC
  Administered 2021-07-30 – 2021-08-01 (×3): 500 mg via ORAL
  Filled 2021-07-29 (×3): qty 1

## 2021-07-29 MED ORDER — APIXABAN 2.5 MG PO TABS
2.5000 mg | ORAL_TABLET | Freq: Two times a day (BID) | ORAL | Status: DC
  Administered 2021-07-30 – 2021-08-01 (×5): 2.5 mg via ORAL
  Filled 2021-07-29 (×7): qty 1

## 2021-07-29 MED ORDER — SENNOSIDES-DOCUSATE SODIUM 8.6-50 MG PO TABS
1.0000 | ORAL_TABLET | Freq: Two times a day (BID) | ORAL | Status: DC
  Administered 2021-07-30 – 2021-08-01 (×5): 1 via ORAL
  Filled 2021-07-29 (×6): qty 1

## 2021-07-29 MED ORDER — FERROUS SULFATE 325 (65 FE) MG PO TABS
325.0000 mg | ORAL_TABLET | Freq: Every day | ORAL | Status: DC
  Administered 2021-07-30 – 2021-08-01 (×3): 325 mg via ORAL
  Filled 2021-07-29 (×3): qty 1

## 2021-07-29 MED ORDER — ZINC SULFATE 220 (50 ZN) MG PO CAPS
220.0000 mg | ORAL_CAPSULE | Freq: Every day | ORAL | Status: DC
  Administered 2021-07-30 – 2021-08-01 (×3): 220 mg via ORAL
  Filled 2021-07-29 (×3): qty 1

## 2021-07-29 MED ORDER — FUROSEMIDE 20 MG PO TABS
10.0000 mg | ORAL_TABLET | Freq: Every day | ORAL | Status: DC
Start: 2021-07-30 — End: 2021-08-01
  Administered 2021-07-30 – 2021-08-01 (×3): 10 mg via ORAL
  Filled 2021-07-29 (×3): qty 0.5

## 2021-07-29 MED ORDER — FINASTERIDE 5 MG PO TABS
5.0000 mg | ORAL_TABLET | Freq: Every day | ORAL | Status: DC
  Administered 2021-07-30 – 2021-08-01 (×3): 5 mg via ORAL
  Filled 2021-07-29 (×3): qty 1

## 2021-07-29 MED ORDER — VITAMIN D 25 MCG (1000 UNIT) PO TABS
2000.0000 [IU] | ORAL_TABLET | Freq: Every day | ORAL | Status: DC
  Administered 2021-07-30 – 2021-08-01 (×3): 2000 [IU] via ORAL
  Filled 2021-07-29 (×3): qty 2

## 2021-07-29 MED ORDER — ACETAMINOPHEN 500 MG PO TABS: 500.0000 mg | ORAL_TABLET | Freq: Four times a day (QID) | ORAL | Status: DC | PRN

## 2021-07-29 MED ORDER — ENSURE ENLIVE PO LIQD
237.0000 mL | Freq: Three times a day (TID) | ORAL | Status: DC
  Administered 2021-07-30 – 2021-07-31 (×5): 237 mL via ORAL

## 2021-07-29 MED ORDER — ROSUVASTATIN CALCIUM 20 MG PO TABS
20.0000 mg | ORAL_TABLET | Freq: Every day | ORAL | Status: DC
Start: 2021-07-30 — End: 2021-08-01
  Administered 2021-07-30: 20 mg via ORAL
  Filled 2021-07-29 (×4): qty 1

## 2021-07-29 MED ORDER — MIRTAZAPINE 15 MG PO TABS
15.0000 mg | ORAL_TABLET | Freq: Every day | ORAL | Status: DC
  Administered 2021-07-30 – 2021-07-31 (×2): 15 mg via ORAL
  Filled 2021-07-29 (×3): qty 1

## 2021-07-29 MED ORDER — ONDANSETRON HCL 4 MG PO TABS: 4.0000 mg | ORAL_TABLET | Freq: Three times a day (TID) | ORAL | Status: DC | PRN

## 2021-07-29 NOTE — ED Triage Notes (Signed)
Pt here with weakness. Pt was recently admitted for same. Hx of CHF and on hospice. PT states he has gotten worse and he usually is able to walk without difficulty. Pt in NAD in triage.

## 2021-07-29 NOTE — ED Notes (Signed)
Pts son at [facility].

## 2021-07-29 NOTE — ED Notes (Signed)
Resumed care from [facility] rn   pt sleeping

## 2021-07-29 NOTE — ED Notes (Signed)
1 set of cultures sent to lab

## 2021-07-29 NOTE — ED Provider Triage Note (Signed)
Emergency Medicine Provider Triage Evaluation Note  Louis Reid , a 88 y.o. male  was evaluated in triage.  Pt complains of continued weakness.  Patient was recently diagnosed with COVID and admitted to the hospital.  Stayed for 3 days and was discharged approximately 1 week ago.  Patient states the weakness has increased.  Son states that the lower extremities have swelling on and off but seem to be better today..  Review of Systems  Positive: Weakness Negative: Headache, stroke symptoms  Physical Exam  BP 103/70 (BP Location: Left Arm)    Pulse 91    Temp 99.1 F (37.3 C) (Oral)    Resp 16    Ht 5\' 11"  (1.803 m)    Wt 49.1 kg    SpO2 97%    BMI 15.10 kg/m  Gen:   Awake, no distress   Resp:  Normal effort, lungs CTA MSK:   Moves extremities without difficulty  Other:    Medical Decision Making  Medically screening exam initiated at 1:03 PM.  Appropriate orders placed.  JAHAAN VANWAGNER was informed that the remainder of the evaluation will be completed by another provider, this initial triage assessment does not replace that evaluation, and the importance of remaining in the ED until their evaluation is complete.  Due to patient's recent admission along with covid history we will do undifferentiated septic work-up due to the amount of weakness and low-grade temp noted in triage.   Versie Starks, [name] 07/29/21 1304

## 2021-07-29 NOTE — ED Notes (Signed)
1 set of blood cultures 1 green top tube 1 blue top tube  sent to the lab by this RN

## 2021-07-29 NOTE — ED Provider Notes (Signed)
[facility] Provider Note    Event Date/Time   First MD Initiated Contact with Patient 07/29/21 1733     (approximate)   History   Weakness   HPI  Louis Reid is a 88 y.o. male with recent COVID-84 on 07/18/2020 and went home with home health on 2/17, A-fib on Eliquis, CKD, decubital ulcer who comes in for weakness.  Patient reports that he is try to go home but he is unable to take care of himself even with home health coming in home health recommended he come back to the ER to get placed to a nursing home.  Patient states that he did not realize that he was go to be ready but now he is ready to be placed into a nursing home.  He denies any falls, hitting his head or any other concerns.  I was the one who previously saw patient with the similar complaints was noted to be COVID-positive on 2/10.  He is now out of the 10-day quarantine.     Physical Exam   Triage Vital Signs: ED Triage Vitals [07/29/21 1257]  Enc Vitals Group     BP 103/70     Pulse Rate 91     Resp 16     Temp 99.1 F (37.3 C)     Temp Source Oral     SpO2 97 %     Weight 108 lb 3.9 oz (49.1 kg)     Height 5\' 11"  (1.803 m)     Head Circumference      Peak Flow      Pain Score 0     Pain Loc      Pain Edu?      Excl. in [place]?     Most recent vital signs: Vitals:   07/29/21 1534 07/29/21 1636  BP: 110/87 103/87  Pulse: (!) 37 99  Resp: 18 16  Temp:    SpO2: 98% 100%     General: Awake, no distress.  CV:  Good peripheral perfusion.  Resp:  Normal effort.  Abd:  No distention.  Other:  No swelling in legs    ED Results / Procedures / Treatments   Labs (all labs ordered are listed, but only abnormal results are displayed) Labs Reviewed  BASIC METABOLIC PANEL - Abnormal; Notable for the following components:      Result Value   Glucose, Bld 110 (*)    BUN 33 (*)    Creatinine, Ser 1.50 (*)    GFR, Estimated 45 (*)    All other components within normal limits   CBC - Abnormal; Notable for the following components:   Platelets 146 (*)    All other components within normal limits  BRAIN NATRIURETIC PEPTIDE - Abnormal; Notable for the following components:   B Natriuretic Peptide 466.1 (*)    All other components within normal limits  TROPONIN I (HIGH SENSITIVITY) - Abnormal; Notable for the following components:   Troponin I (High Sensitivity) 22 (*)    All other components within normal limits  TROPONIN I (HIGH SENSITIVITY) - Abnormal; Notable for the following components:   Troponin I (High Sensitivity) 23 (*)    All other components within normal limits  CULTURE, BLOOD (ROUTINE X 2)  CULTURE, BLOOD (ROUTINE X 2)  URINE CULTURE  LACTIC ACID, PLASMA  URINALYSIS, ROUTINE W REFLEX MICROSCOPIC  LACTIC ACID, PLASMA  URINALYSIS, COMPLETE (UACMP) WITH MICROSCOPIC  PROTIME-INR  APTT  CBG MONITORING, ED  EKG  My interpretation of EKG:  Normal sinus rate of 82 without any elevation, T wave inversion in V6, normal intervals except for prolonged QTc at 500  RADIOLOGY I have reviewed the xray personally and see minimal left pleural effusion  PROCEDURES:  Critical Care performed: No  Procedures   MEDICATIONS ORDERED IN ED: Medications - No data to display   IMPRESSION / MDM / [place] / ED COURSE  I reviewed the triage vital signs and the nursing notes.   Differential diagnosis includes, but is not limited to, worsening weakness in the setting of recent admission.  I saw patient previously and I had patient admitted due to COVID and weakness with a plan to go to SNF but patient was discharged home stating that he felt like he could do it but now stating that he cannot take care of himself.  I was the one who previously saw patient with the similar complaints was noted to be COVID-positive on 2/10.  He is now out of the 10-day quarantine.  Troponin is elevated at 22 but upon repeat is stable at 23 BMP is slightly  elevated creatinine from 5 days ago CBC shows stably low platelets but normal white count Lactate was normal BNP is slightly elevated  UA is still pending but will discuss with social work, PT and place patient as a border.   FINAL CLINICAL IMPRESSION(S) / ED DIAGNOSES   Final diagnoses:  Weakness     Rx / DC Orders   ED Discharge Orders     None        Note:  This document was prepared using Dragon voice recognition software and may include unintentional dictation errors.   Vanessa [place], MD 07/29/21 2132

## 2021-07-29 NOTE — ED Notes (Signed)
This RN asked pt again if he can provide a urine sample. Pt states he cannot at this time.

## 2021-07-29 NOTE — ED Notes (Signed)
Pt states he is here b/c he is too weak to care for himself at home. Pt reports decreased appetite, generalized weakness x months. Pt states he lives alone & is unable to do basic ADLs to care for himself at this point.

## 2021-07-29 NOTE — ED Notes (Signed)
This RN asked pt to provide a urine sample. Pt unable to provide one at this time, but understands that he does need to provide one.

## 2021-07-30 LAB — URINALYSIS, COMPLETE (UACMP) WITH MICROSCOPIC
Bacteria, UA: NONE SEEN
Bilirubin Urine: NEGATIVE
Glucose, UA: NEGATIVE mg/dL
Hgb urine dipstick: NEGATIVE
Ketones, ur: NEGATIVE mg/dL
Leukocytes,Ua: NEGATIVE
Nitrite: NEGATIVE
Protein, ur: 100 mg/dL — AB
Specific Gravity, Urine: 1.019 (ref 1.005–1.030)
Squamous Epithelial / HPF: NONE SEEN (ref 0–5)
pH: 5 (ref 5.0–8.0)

## 2021-07-30 NOTE — ED Notes (Signed)
Case manager at bedside 

## 2021-07-30 NOTE — ED Notes (Signed)
Pt sleeping. 

## 2021-07-30 NOTE — Evaluation (Signed)
Physical Therapy Evaluation Patient Details Name: Louis Reid MRN: [id number] DOB: 06-Aug-1932 Today's Date: 07/30/2021  History of Present Illness  Louis Reid is a 88 y.o. male with medical history significant of coronary disease, heart failure ejection fraction 20%, paroxysmal atrial fibrillation, chronic kidney disease stage IIIb, BPH, hypertension, dyslipidemia, PE, malnutrition who presented with generalized weakness and poor appetite.  Multiple recent hospitalizations with similar symptoms and failure to thrive.  Apparently patient lives alone and unable to take care of himself.  He was in hospice patient but revoked his hospice and not currently following with outpatient palliative care.  Clinical Impression  Patient resting in bed upon arrival to session; alert and oriented to basic information, but very limited short-term memory and recall of new information evident.  Denies pain, but continues to endorse generalized weakness.  Bilat UE/LE strength and ROM grossly symmetrical and WFL for basic transfers and gait, though globally weak and deconditioned.  Able to complete bed mobility with mod indep; sit/stand, basic transfers and gait (25') with RW, cga/close sup.  Demonstrates very forward flexed, kyphotic posture; short, shuffling steps.  Poor activity tolerance, distance limited as result.  Do recommend continued use of RW for all gait trials. Of note, patient with noted symptomatic orthostasis during transition to upright (see vitals flowsheet for results), noted 40-point drop in SBP from supine to sit.  Unable to obtain full measurement in standing.  Patient endorses symptoms as "weakness in legs and tightness in chest".  Fully resolves with return to supine.  Will continue to monitor with progressive mobility in subsequent sessions. Would benefit from skilled PT to address above deficits and promote optimal return to PLOF.; Recommend transition to [place] upon discharge from acute  hospitalization as medically appropriate.      Recommendations for follow up therapy are one component of a multi-disciplinary discharge planning process, led by the attending physician.  Recommendations may be updated based on patient status, additional functional criteria and insurance authorization.  Follow Up Recommendations Home health PT    Assistance Recommended at Discharge Intermittent Supervision/Assistance  Patient can return home with the following  A little help with walking and/or transfers;Assistance with cooking/housework;A little help with bathing/dressing/bathroom    Equipment Recommendations Rolling walker (2 wheels)  Recommendations for Other Services       Functional Status Assessment Patient has had a recent decline in their functional status and demonstrates the ability to make significant improvements in function in a reasonable and predictable amount of time.     Precautions / Restrictions Precautions Precautions: Fall Restrictions Weight Bearing Restrictions: No      Mobility  Bed Mobility Overal bed mobility: Modified Independent                  Transfers Overall transfer level: Needs assistance Equipment used: Rolling walker (2 wheels) Transfers: Sit to/from Stand Sit to Stand: Min guard           General transfer comment: cuing for hand placement; does fatigue quickly, suspect significant orthostasis    Ambulation/Gait Ambulation/Gait assistance: Min guard Gait Distance (Feet): 25 Feet Assistive device: Rolling walker (2 wheels)         General Gait Details: very forward flexed, kyphotic posture; short, shuffling steps.  Poor activity tolerance, distance limited as result.  Do recommend continued use of RW for all gait trials.  Stairs            Wheelchair Mobility    Modified Rankin (Stroke Patients Only)  Balance Overall balance assessment: Needs assistance Sitting-balance support: No upper extremity  supported, Feet supported Sitting balance-Leahy Scale: Fair Sitting balance - Comments: list posteriorally with fatigue, corrects with cuing; functional seated reach >8" with good control, good awareness of limits of stability   Standing balance support: Bilateral upper extremity supported Standing balance-Leahy Scale: Fair Standing balance comment: requires bilat UE support for optimal safety                             Pertinent Vitals/Pain Pain Assessment Pain Assessment: No/denies pain    Home Living Family/patient expects to be discharged to:: Private residence Living Arrangements: Alone Available Help at Discharge: Available PRN/intermittently;Family Type of Home: House Home Access: Stairs to enter Entrance Stairs-Rails: Right;Left;Can reach both Entrance Stairs-Number of Steps: 5 Alternate Level Stairs-Number of Steps: 14 Home Layout: Able to live on main level with bedroom/bathroom;Two level Home Equipment: Grab bars - toilet;Cane - single point;Rolling Walker (2 wheels) Additional Comments: Pt lives with son however, he is unable to care for patient due to medical complications.    Prior Function Prior Level of Function : Independent/Modified Independent             Mobility Comments: At baseline, indep with SPC for ADLs, household mobilization; does endorse recent functional decline, generalized fatigue-was actively participating with [facility] services.  Denies fall history. ADLs Comments: Pt reports indep with basic ADL at baseline     Hand Dominance   Dominant Hand: Right    Extremity/Trunk Assessment   Upper Extremity Assessment Upper Extremity Assessment: Generalized weakness    Lower Extremity Assessment Lower Extremity Assessment: Generalized weakness (grossly 4-/5 throughout; no focal weakness appreciated)    Cervical / Trunk Assessment Cervical / Trunk Assessment: Kyphotic (very forward head, forward flexed posture)  Communication    Communication: No difficulties  Cognition Arousal/Alertness: Awake/alert Behavior During Therapy: WFL for tasks assessed/performed Overall Cognitive Status: Within Functional Limits for tasks assessed                                 General Comments: limited short-term recall; does tend to repeat information multiple times throughout session        General Comments      Exercises Other Exercises Other Exercises: Reviewed role of PT and progressive mobility, safety needs with mobility, awareness of signs/symptoms of fatigue and symptomatic orthostasis; will continue to reinforce throughout stay. Other Exercises: Orthostatic assessment--significant drop in BP from supine to sit (symptomatic); does resolve with return to supine.  RN informed/aware; activity limited as result.   Assessment/Plan    PT Assessment Patient needs continued PT services  PT Problem List Decreased strength;Decreased mobility;Decreased range of motion;Decreased activity tolerance;Decreased balance;Decreased knowledge of use of DME;Decreased safety awareness;Decreased knowledge of precautions       PT Treatment Interventions DME instruction;Therapeutic exercise;Gait training;Balance training;Stair training;Neuromuscular re-education;Functional mobility training;Therapeutic activities;Patient/family education    PT Goals (Current goals can be found in the Care Plan section)  Acute Rehab PT Goals Patient Stated Goal: to go home PT Goal Formulation: With patient Time For Goal Achievement: 08/13/21 Potential to Achieve Goals: Good    Frequency Min 2X/week     Co-evaluation               AM-PAC PT "6 Clicks" Mobility  Outcome Measure Help needed turning from your back to your side while in a flat bed  without using bedrails?: None Help needed moving from lying on your back to sitting on the side of a flat bed without using bedrails?: None Help needed moving to and from a bed to a chair  (including a wheelchair)?: A Little Help needed standing up from a chair using your arms (e.g., wheelchair or bedside chair)?: A Little Help needed to walk in hospital room?: A Little Help needed climbing 3-5 steps with a railing? : A Little 6 Click Score: 20    End of Session Equipment Utilized During Treatment: Gait belt Activity Tolerance: Patient tolerated treatment well;Treatment limited secondary to medical complications (Comment) Patient left: in bed;with call bell/phone within reach Nurse Communication: Mobility status PT Visit Diagnosis: Other abnormalities of gait and mobility (R26.89);Difficulty in walking, not elsewhere classified (R26.2);Muscle weakness (generalized) (M62.81)    Time: 4034-7425 PT Time Calculation (min) (ACUTE ONLY): 33 min   Charges:   PT Evaluation $PT Eval Moderate Complexity: 1 Mod PT Treatments $Therapeutic Activity: 8-22 mins        Donielle Kaigler H. Owens Shark, PT, DPT, NCS 07/30/21, 12:41 PM [phone]

## 2021-07-30 NOTE — NC FL2 (Signed)
[place] LEVEL OF CARE SCREENING TOOL     IDENTIFICATION  Patient Name: Louis Reid Birthdate: Jul 13, 1932 Sex: male Admission Date (Current Location): 07/29/2021  [facility] and [place] Number:  Engineering geologist and Address:  [facility], [address], [place], [place] [address]      Provider Number: [id number]  Attending Physician Name and Address:  No att. providers found  Relative Name and Phone Number:  Louis Reid - son- [phone]    Current Level of Care: Hospital Recommended Level of Care: Assisted Living Facility Prior Approval Number:    Date Approved/Denied:   PASRR Number:    Discharge Plan: Domiciliary (Rest home) (ALF)    Current Diagnoses: Patient Active Problem List   Diagnosis Date Noted   COVID-19 virus infection 07/19/2021   Decubital ulcer 04/04/2021   AKI (acute kidney injury) ([place]) 04/02/2021   Splenic infarct    Hepatic steatosis    Stage 3b chronic kidney disease ([place]) 03/11/2021   Unable to care for self 03/11/2021   Constipation 03/11/2021   Failure to thrive in adult 03/11/2021   Protein-calorie malnutrition, severe 02/22/2021   Generalized weakness    Palliative care encounter    Goals of care, counseling/discussion    Encounter for hospice care discussion    Heart failure ([place]) 02/28/2018   Decreased cardiac ejection fraction 01/10/2018   Nonspecific chest pain    Chronic systolic CHF (congestive heart failure) ([place])    Unstable angina ([place]):  (01/20/2017) a. 3-v CAD w/ patent SVG to diag and patent sequential SVG to OM 2 and OM 3. Patent prox LAD stent. LIMA to LAD and SVG to RCA known occl. 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) 05/10/2012   Acute respiratory failure with hypoxia 2/2 cardiac arrest 05/10/2012   Acute encephalopathy 2/2 cardiac arrest 05/10/2012   Hyperglycemia 05/10/2012   Odynophagia 05/07/2012   Coronary artery disease 07/09/2011   History of coronary artery bypass surgery 07/09/2011   DYSPNEA 06/04/2010   Hyperlipidemia, unspecified 04/17/2009   Essential hypertension 04/17/2009   Asymptomatic old MI (myocardial infarction) 04/17/2009   TOBACCO USE, QUIT 04/17/2009    Orientation RESPIRATION BLADDER Height & Weight     Self, Time, Situation, Place  Normal Continent Weight: 49.1 kg Height:  5\' 11"  (180.3 cm)  BEHAVIORAL SYMPTOMS/MOOD NEUROLOGICAL BOWEL NUTRITION STATUS      Continent Diet (Carb Modified- 1600-2000 cal / day)  AMBULATORY STATUS COMMUNICATION OF NEEDS Skin   Limited Assist Verbally PU Stage and Appropriate Care   PU Stage 2 Dressing:  (sacrum foam dressing)                   Personal Care Assistance Level of Assistance  Bathing, Feeding, Dressing Bathing Assistance: Limited assistance Feeding assistance: Independent Dressing Assistance: Limited assistance     Functional Limitations Info  Sight, Hearing, Speech Sight Info: Adequate Hearing Info: Adequate Speech Info: Adequate    SPECIAL CARE FACTORS FREQUENCY  PT (By licensed  PT), OT (By licensed OT)     PT Frequency: home health OT Frequency: home health            Contractures Contractures Info: Not present    Additional Factors Info  Code Status, Allergies Code Status Info: DNR Allergies Info: NKA           Current Medications (07/30/2021):  This is the current hospital active medication  list Current Facility-Administered Medications  Medication Dose Route Frequency Provider Last Rate Last Admin   acetaminophen (TYLENOL) tablet 500 mg  500 mg Oral Q6H PRN Vanessa [place], MD       apixaban Arne [place]) tablet 2.5 mg  2.5 mg Oral BID Vanessa [place], MD   2.5 mg at 07/30/21 9924   ascorbic acid (VITAMIN C) tablet 500 mg  500 mg Oral Daily Vanessa [place], MD   500 mg at 07/30/21 2683   cholecalciferol (VITAMIN D3) tablet 2,000 Units  2,000 Units Oral Daily Vanessa [place], MD   2,000 Units at 07/30/21 4196   feeding supplement (ENSURE ENLIVE / ENSURE PLUS) liquid 237 mL  237 mL Oral TID BM Vanessa [place]al[place], MD   237 mL at 07/30/21 1022   ferrous sulfate tablet 325 mg  325 mg Oral Q breakfast Vanessa [place], MD   325 mg at 07/30/21 2229   finasteride (PROSCAR) tablet 5 mg  5 mg Oral Daily Vanessa [place], MD   5 mg at 07/30/21 7989   furosemide (LASIX) tablet 10 mg  10 mg Oral Daily Vanessa [place], MD   10 mg at 07/30/21 2119   mirtazapine (REMERON) tablet 15 mg  15 mg Oral QHS Vanessa [place], MD       ondansetron [facility]) tablet 4 mg  4 mg Oral Q8H PRN Vanessa [place], MD       rosuvastatin (CRESTOR) tablet 20 mg  20 mg Oral Q2000 Vanessa [place], MD       senna-docusate (Senokot-S) tablet 1 tablet  1 tablet Oral BID Vanessa [place], MD   1 tablet at 07/30/21 0940   zinc sulfate capsule 220 mg  220 mg Oral Daily Vanessa [place], MD   220 mg at 07/30/21 4174   Current Outpatient Medications  Medication Sig Dispense Refill   apixaban (ELIQUIS) 2.5 MG TABS tablet Take 1 tablet (2.5 mg total) by mouth 2 (two) times daily. 60 tablet 0   ascorbic acid (VITAMIN C) 500 MG tablet Take 1 tablet (500 mg total) by mouth daily. 30 tablet 0   Cholecalciferol (D3-1000) 25 MCG (1000 UT) capsule Take 2,000 Units by mouth daily.     ferrous sulfate 325 (65 FE) MG tablet Take 325 mg by mouth daily with breakfast.     finasteride (PROSCAR) 5 MG tablet Take 5 mg by mouth daily.     furosemide (LASIX) 20 MG tablet Take 10 mg  by mouth daily.     Multiple Vitamins-Minerals (ICAPS AREDS 2 PO) Take 1 capsule by mouth 2 (two) times daily.     rosuvastatin (CRESTOR) 20 MG tablet TAKE 1 TABLET BY MOUTH EVERY DAY 90 tablet 3   senna-docusate (SENOKOT-S) 8.6-50 MG tablet Take 1 tablet by mouth 2 (two) times daily.     zinc sulfate 220 (50 Zn) MG capsule Take 220 mg by mouth daily.     acetaminophen (TYLENOL) 500 MG tablet Take 1 tablet (500 mg total) by mouth every 6 (six) hours as needed for mild  pain (or Fever >/= 101). 30 tablet 0   feeding supplement (ENSURE ENLIVE / ENSURE PLUS) LIQD Take 237 mLs by mouth 3 (three) times daily between meals. 237 mL 12   mirtazapine (REMERON) 15 MG tablet Take 1 tablet (15 mg total) by mouth at bedtime. 30 tablet 0   ondansetron (ZOFRAN) 4 MG tablet Take 1 tablet (4 mg total) by mouth every 8 (eight) hours as needed for nausea or vomiting. 20 tablet 0     Discharge Medications: Please see discharge summary for a list of discharge medications.  Relevant Imaging Results:  Relevant Lab Results:   Additional Information [id number]  Shelbie Hutching, RN

## 2021-07-30 NOTE — ED Notes (Signed)
Pt given dinner tray and bedside cleaned.

## 2021-07-30 NOTE — ED Notes (Signed)
Dietary called for breakfast tray.

## 2021-07-30 NOTE — ED Notes (Signed)
Pt moved to room 1.  Report off to alycia rn

## 2021-07-30 NOTE — TOC Initial Note (Signed)
Transition of Care [facility]) - Initial/Assessment Note    Patient Details  Name: Louis Reid MRN: [id number] Date of Birth: May 08, 1933  Transition of Care [facility]) CM/SW Contact:    Shelbie Hutching, RN Phone Number: 07/30/2021, 1:09 PM  Clinical Narrative:                 Patient comes to the emergency room with reports of weakness and inability to care for himself at home.  Patient lives alone, his son was staying with him up until yesterday and he brought home him to the ER then he went back home to [facility].  RNCM met with patient at the bedside and spoke with son via phone.  Patient does not currently qualify for short term rehab, therapy is recommending home health.  Patient reports that he has a long term care policy with [facility] but he also says he cannot afford to private pay any until the insurance kicks in. Patient's son [name], says that patient does have the money he just doesn't want to spend it.   [name] has access to his checking account as they have a joint account and will gladly pay for ALF.  [facility] beds available.  RNCM reached out to [facility] with [facility], she will speak with patient and son about ALF.    [name] reports that patient has been to [facility] in [place] before but left an went back home because he did not like Clinical biochemist of the facility.  Will see how patient feels about [facility] in [place] if he does not agree, Catering manager with [employer] can look at other facilities.   Expected Discharge Plan: Assisted Living Barriers to Discharge: Unsafe home situation   Patient Goals and CMS Choice Patient states their goals for this hospitalization and ongoing recovery are:: Patient wants skilled care- he says that he can't take care of himself CMS Medicare.gov Compare Post Acute Care list provided to:: Patient Choice offered to / list presented to : Patient  Expected Discharge Plan and Services Expected Discharge Plan: Assisted Living    Discharge Planning Services: CM Consult   Living arrangements for the past 2 months: Single Family Home                 DME Arranged: N/A DME Agency: NA       HH Arranged: NA HH Agency: NA        Prior Living Arrangements/Services Living arrangements for the past 2 months: Single Family Home Lives with:: Self Patient language and need for interpreter reviewed:: Yes Do you feel safe going back to the place where you live?: Yes      Need for Family Participation in Patient Care: Yes (Comment) Care giver support system in place?: Yes (comment) Current home services: DME, Home PT, Home RN Criminal Activity/Legal Involvement Pertinent to Current Situation/Hospitalization: No - Comment as needed  Activities of Daily Living      Permission Sought/Granted Permission sought to share information with : Family Supports, Customer service manager, Case Optician, dispensing granted to share information with : Yes, Verbal Permission Granted  Share Information with NAME: Gerrell Tabet  Permission granted to share info w AGENCY: ALF  Permission granted to share info w Relationship: son  Permission granted to share info w Contact Information: [phone]  Emotional Assessment Appearance:: Appears stated age Attitude/Demeanor/Rapport: Engaged Affect (typically observed): Accepting Orientation: : Oriented to Self, Oriented to Place, Oriented to  Time, Oriented to Situation Alcohol / Substance Use:  Not Applicable Psych Involvement: No (comment)  Admission diagnosis:  general weakness Patient Active Problem List   Diagnosis Date Noted   COVID-19 virus infection 07/19/2021   Decubital ulcer 04/04/2021   AKI (acute kidney injury) ([place]) 04/02/2021   Splenic infarct    Hepatic steatosis    Stage 3b chronic kidney disease ([place]) 03/11/2021   Unable to care for self 03/11/2021   Constipation 03/11/2021   Failure to thrive in adult 03/11/2021   Protein-calorie malnutrition, severe  02/22/2021   Generalized weakness    Palliative care encounter    Goals of care, counseling/discussion    Encounter for hospice care discussion    Heart failure ([place]) 02/28/2018   Decreased cardiac ejection fraction 01/10/2018   Nonspecific chest pain    Chronic systolic CHF (congestive heart failure) ([place])    Unstable angina ([place]):  (01/20/2017) a. 3-v CAD w/ patent SVG to diag and patent sequential SVG to OM 2 and OM 3. Patent prox LAD stent. LIMA to LAD and SVG to RCA known occl. stable prox RCA sten  01/19/2017   Paroxysmal atrial fibrillation ([place]) 03/17/2016   Claudication ([place]) 01/22/2014   Chronic kidney disease 12/26/2013   ICD (implantable cardioverter-defibrillator) in place 02/16/2013   Postop check 07/05/2012   STEMI (ST elevation myocardial infarction) ([place]) 05/18/2012   Anemia in chronic kidney disease(285.21) 05/17/2012   PE (pulmonary embolism) 05/17/2012   Physical deconditioning 05/17/2012   Anorexia 05/17/2012   Cardiogenic shock ([place]) 05/16/2012   Arterial hypotension 05/16/2012   Ventricular fibrillation arrest 05/10/2012   Cardiac arrest ([place]) 05/10/2012   Acute respiratory failure with hypoxia 2/2 cardiac arrest 05/10/2012   Acute encephalopathy 2/2 cardiac arrest 05/10/2012   Hyperglycemia 05/10/2012   Odynophagia 05/07/2012   Coronary artery disease 07/09/2011   History of coronary artery bypass surgery 07/09/2011   DYSPNEA 06/04/2010   Hyperlipidemia, unspecified 04/17/2009   Essential hypertension 04/17/2009   Asymptomatic old MI (myocardial infarction) 04/17/2009   TOBACCO USE, QUIT 04/17/2009   PCP:  [name], MD Pharmacy:   [id number]: [phone]: [phone] [facility]Phone: [phone]:  [phone]    Social Determinants of Health (SDOH) Interventions    Readmission Risk Interventions No flowsheet data found.

## 2021-07-30 NOTE — TOC Progression Note (Signed)
Transition of Care [facility]) - Progression Note    Patient Details  Name: CORTAVIUS MONTESINOS MRN: [id number] Date of Birth: [birth year]  Transition of Care [facility]) CM/SW Contact  Shelbie Hutching, RN Phone Number: 07/30/2021, 2:31 PM  Clinical Narrative:    Patient does not want to go to ALF but wants to go for long term care Sn[facility].  First Preference is [facility] and second is [facility]  Bed request sent.    Expected Discharge Plan: Assisted Living Barriers to Discharge: Unsafe home situation  Expected Discharge Plan and Services Expected Discharge Plan: Assisted Living   Discharge Planning Services: CM Consult   Living arrangements for the past 2 months: Single Family Home                 DME Arranged: N/A DME Agency: NA       HH Arranged: NA HH Agency: NA         Social Determinants of Health (SDOH) Interventions    Readmission Risk Interventions No flowsheet data found.

## 2021-07-30 NOTE — Evaluation (Signed)
Occupational Therapy Evaluation Patient Details Name: Louis Reid MRN: [id number] DOB: 1932-08-17 Today's Date: 07/30/2021   History of Present Illness Louis Reid is a 88 y.o. male with medical history significant of coronary disease, heart failure ejection fraction 20%, paroxysmal atrial fibrillation, chronic kidney disease stage IIIb, BPH, hypertension, dyslipidemia, PE, malnutrition who presented with generalized weakness and poor appetite.  Multiple recent hospitalizations with similar symptoms and failure to thrive.  Apparently patient lives alone and unable to take care of himself.  He was in hospice patient but revoked his hospice and not currently following with outpatient palliative care.   Clinical Impression   Upon entering the room, pt supine in bed and agreeable to OT intervention. Pt with recent discharge from hospital with recommendation for supervision at discharge. Pt reports being at home for several days with son staying with him to assist with IADL tasks. Pt reports he has had no falls since being home and is able to complete ADL tasks but fatigues quickly. Pt reporting ambulation with SPC at baseline and living at home alone prior to initially hospital admission with covid (~ 2 weeks ago). This session pt performing bed mobility without assistance and ambulation without use of AD at supervision level to bathroom for standing grooming tasks. Staff also reporting supervision for toileting needs prior to therapist arrival. Pt appears to be close to baseline physically other than reports of fatigue with activity. OT will continue to work with pt in hospital to maintain current level and educate on energy conservation strategies with self care tasks.OT recommends pt return home with supervision for safety at discharge.      Recommendations for follow up therapy are one component of a multi-disciplinary discharge planning process, led by the attending physician.  Recommendations may  be updated based on patient status, additional functional criteria and insurance authorization.   Follow Up Recommendations  Home health OT    Assistance Recommended at Discharge Frequent or constant Supervision/Assistance  Patient can return home with the following Assistance with cooking/housework;Help with stairs or ramp for entrance;Assist for transportation    Functional Status Assessment  Patient has had a recent decline in their functional status and demonstrates the ability to make significant improvements in function in a reasonable and predictable amount of time.  Equipment Recommendations  None recommended by OT       Precautions / Restrictions Precautions Precautions: Fall Restrictions Weight Bearing Restrictions: No      Mobility Bed Mobility Overal bed mobility: Independent Bed Mobility: Supine to Sit, Sit to Supine     Supine to sit: Modified independent (Device/Increase time) Sit to supine: Modified independent (Device/Increase time)   General bed mobility comments: from stretcher bed in ED    Transfers Overall transfer level: Needs assistance Equipment used: None Transfers: Sit to/from Stand Sit to Stand: Supervision Stand pivot transfers: Supervision         General transfer comment: supervision for safety but no physical assistance provided      Balance Overall balance assessment: Needs assistance Sitting-balance support: Feet supported Sitting balance-Leahy Scale: Good     Standing balance support: Single extremity supported, During functional activity Standing balance-Leahy Scale: Good Standing balance comment: no LOB                           ADL either performed or assessed with clinical judgement   ADL Overall ADL's : Needs assistance/impaired  General ADL Comments: supervision overall for self care tasks within the room in standing without use of AD     Vision  Patient Visual Report: No change from baseline              Pertinent Vitals/Pain Pain Assessment Pain Assessment: No/denies pain     Hand Dominance Right   Extremity/Trunk Assessment Upper Extremity Assessment Upper Extremity Assessment: Overall WFL for tasks assessed;Generalized weakness   Lower Extremity Assessment Lower Extremity Assessment: Overall WFL for tasks assessed;Generalized weakness       Communication Communication Communication: No difficulties   Cognition Arousal/Alertness: Awake/alert Behavior During Therapy: WFL for tasks assessed/performed Overall Cognitive Status: Within Functional Limits for tasks assessed                                                  Home Living Family/patient expects to be discharged to:: Private residence Living Arrangements: Alone Available Help at Discharge: Available PRN/intermittently;Family Type of Home: House Home Access: Stairs to enter [employer] of Steps: 5 Entrance Stairs-Rails: Right;Left;Can reach both Home Layout: Able to live on main level with bedroom/bathroom;Two level Alternate Level Stairs-Number of Steps: 14 Alternate Level Stairs-Rails: Right Bathroom Shower/Tub: Teacher, early years/pre: Standard Bathroom Accessibility: Yes   Home Equipment: Grab bars - toilet;Cane - single point;Rolling Walker (2 wheels)          Prior Functioning/Environment Prior Level of Function : Independent/Modified Independent                        OT Problem List: Decreased strength;Decreased activity tolerance;Impaired balance (sitting and/or standing);Decreased safety awareness;Decreased knowledge of use of DME or AE      OT Treatment/Interventions: Self-care/ADL training;Therapeutic exercise;Therapeutic activities;DME and/or AE instruction;Patient/family education;Balance training;Energy conservation    OT Goals(Current goals can be found in the care plan  section) Acute Rehab OT Goals Patient Stated Goal: "to go to nursing home" OT Goal Formulation: With patient Time For Goal Achievement: 08/13/21 Potential to Achieve Goals: Good ADL Goals Pt Will Perform Grooming: (P) with modified independence Pt Will Perform Lower Body Dressing: (P) with modified independence Pt Will Transfer to Toilet: (P) with modified independence Pt Will Perform Toileting - Clothing Manipulation and hygiene: (P) with modified independence Pt Will Perform Tub/Shower Transfer: (P) with supervision  OT Frequency: Min 2X/week       AM-PAC OT "6 Clicks" Daily Activity     Outcome Measure Help from another person eating meals?: None Help from another person taking care of personal grooming?: None Help from another person toileting, which includes using toliet, bedpan, or urinal?: A Little Help from another person bathing (including washing, rinsing, drying)?: A Little Help from another person to put on and taking off regular upper body clothing?: None Help from another person to put on and taking off regular lower body clothing?: A Little 6 Click Score: 21   End of Session Nurse Communication: Mobility status  Activity Tolerance: Patient tolerated treatment well;Patient limited by fatigue Patient left: in bed;with call bell/phone within reach;with bed alarm set  OT Visit Diagnosis: Other abnormalities of gait and mobility (R26.89);Muscle weakness (generalized) (M62.81)                Time: 8828-0034 OT Time Calculation (min): 21 min Charges:  OT General Charges $OT Visit: 1  Visit OT Evaluation $OT Eval Low Complexity: 1 Low OT Treatments $Self Care/Home Management : 8-22 mins Darleen Crocker, MS, OTR/L , CBIS ascom [phone]  07/30/21, 10:06 AM

## 2021-07-30 NOTE — ED Notes (Signed)
OT at bedside. 

## 2021-07-30 NOTE — ED Notes (Signed)
PT at bedside, orthostatics taken and PT states pt was orthostatic+ from laying to sitting.

## 2021-07-30 NOTE — ED Notes (Signed)
Pt resting comfortably at this time. Pt ambulated to restroom to have a BM, pt educated on call bell next to toilet for safety reasons. Toilet call bell in reach.

## 2021-07-31 LAB — BLOOD CULTURE ID PANEL (REFLEXED) - BCID2

## 2021-07-31 LAB — CBC
HCT: 39 % (ref 39.0–52.0)
Hemoglobin: 12.7 g/dL — ABNORMAL LOW (ref 13.0–17.0)
MCH: 30.9 pg (ref 26.0–34.0)
MCHC: 32.6 g/dL (ref 30.0–36.0)
MCV: 94.9 fL (ref 80.0–100.0)
Platelets: 112 10*3/uL — ABNORMAL LOW (ref 150–400)
RBC: 4.11 MIL/uL — ABNORMAL LOW (ref 4.22–5.81)
RDW: 14.6 % (ref 11.5–15.5)
WBC: 5.8 10*3/uL (ref 4.0–10.5)
nRBC: 0 % (ref 0.0–0.2)

## 2021-07-31 LAB — LACTIC ACID, PLASMA: Lactic Acid, Venous: 1.1 mmol/L (ref 0.5–1.9)

## 2021-07-31 MED ORDER — CEFTRIAXONE SODIUM 2 G IJ SOLR
2.0000 g | INTRAMUSCULAR | Status: DC
Start: 2021-07-31 — End: 2021-08-01
  Administered 2021-07-31: 2 g via INTRAVENOUS
  Filled 2021-07-31: qty 20

## 2021-07-31 NOTE — ED Provider Notes (Signed)
----------------------------------------- °  11:32 PM on 07/31/2021 ----------------------------------------- Patient was signed out to me by Dr. Merdis Delay.  Dr. Cheri Fowler states the patient did have 1 out of 4 blood cultures result positive.  I would order a CBC and a lactic.  Both the CBC and the lactate resulted largely within normal limits with a normal lactate of 1.1 and normal WBC of 5.8.  Patient has no obvious infectious symptoms.  He is afebrile reassuring vitals.  Urine culture resulted positive for 4000 colonies.  Dr. Cheri Fowler did start the patient on IV antibiotics.  I have ordered 2 new sets of blood cultures.  As the patient is lacking any obvious infectious source or symptoms we will maintain the plan for social work evaluation and placement.  If the patient's repeat blood cultures result positive we will reassess possible admission.   Harvest Dark, MD 07/31/21 [phone]

## 2021-07-31 NOTE — TOC Progression Note (Signed)
Transition of Care [facility]) - Progression Note    Patient Details  Name: Louis Reid MRN: [id number] Date of Birth: 09-Feb-1933  [facility] Care [facility]) CM/SW Contact  Shelbie Hutching, RN Phone Number: 07/31/2021, 8:51 AM  Clinical Narrative:    [facility] offered a bed- bed [facility] accepted at [facility].  Patient's son [name] updated on plan of care.  Olivia Mackie with admissions over at [facility] will be contacting [name] to discuss financials and they can take patient tomorrow.     Expected Discharge Plan: [place] Barriers to Discharge: Unsafe home situation  Expected Discharge Plan and Services Expected Discharge Plan: [place]   Discharge Planning Services: CM Consult Post Acute Care Choice: [place] Living arrangements for the past 2 months: Single Family Home                 DME Arranged: N/A DME Agency: NA       HH Arranged: NA HH Agency: NA         Social Determinants of Health (SDOH) Interventions    Readmission Risk Interventions No flowsheet data found.

## 2021-07-31 NOTE — Progress Notes (Signed)
PHARMACY - PHYSICIAN COMMUNICATION CRITICAL VALUE ALERT - BLOOD CULTURE IDENTIFICATION (BCID)  Louis Reid is an 88 y.o. male who presented to [facility] on 07/29/2021 with a chief complaint of weakness following recent admission  Assessment:  2/21 blood culture with GNR in anaerobic bottle on one set.  BCID did not dectect anything. Source unknown.  Not clear why blood cx ordered.    Name of physician (or Provider) Contacted: Dr Cheri Fowler  Current antibiotics: none  Changes to prescribed antibiotics recommended:  Recommendations accepted by provider - start ceftriaxone pending identification  Results for orders placed or performed during the hospital encounter of 07/29/21  Blood Culture ID Panel (Reflexed) (Collected: 07/29/2021 12:59 PM)  Result Value Ref Range   Enterococcus faecalis NOT DETECTED NOT DETECTED   Enterococcus Faecium NOT DETECTED NOT DETECTED   Listeria monocytogenes NOT DETECTED NOT DETECTED   Staphylococcus species NOT DETECTED NOT DETECTED   Staphylococcus aureus (BCID) NOT DETECTED NOT DETECTED   Staphylococcus epidermidis NOT DETECTED NOT DETECTED   Staphylococcus lugdunensis NOT DETECTED NOT DETECTED   Streptococcus species NOT DETECTED NOT DETECTED   Streptococcus agalactiae NOT DETECTED NOT DETECTED   Streptococcus pneumoniae NOT DETECTED NOT DETECTED   Streptococcus pyogenes NOT DETECTED NOT DETECTED   A.calcoaceticus-baumannii NOT DETECTED NOT DETECTED   Bacteroides fragilis NOT DETECTED NOT DETECTED   Enterobacterales NOT DETECTED NOT DETECTED   Enterobacter cloacae complex NOT DETECTED NOT DETECTED   Escherichia coli NOT DETECTED NOT DETECTED   Klebsiella aerogenes NOT DETECTED NOT DETECTED   Klebsiella oxytoca NOT DETECTED NOT DETECTED   Klebsiella pneumoniae NOT DETECTED NOT DETECTED   Proteus species NOT DETECTED NOT DETECTED   Salmonella species NOT DETECTED NOT DETECTED   Serratia marcescens NOT DETECTED NOT DETECTED   Haemophilus influenzae  NOT DETECTED NOT DETECTED   Neisseria meningitidis NOT DETECTED NOT DETECTED   Pseudomonas aeruginosa NOT DETECTED NOT DETECTED   Stenotrophomonas maltophilia NOT DETECTED NOT DETECTED   Candida albicans NOT DETECTED NOT DETECTED   Candida auris NOT DETECTED NOT DETECTED   Candida glabrata NOT DETECTED NOT DETECTED   Candida krusei NOT DETECTED NOT DETECTED   Candida parapsilosis NOT DETECTED NOT DETECTED   Candida tropicalis NOT DETECTED NOT DETECTED   Cryptococcus neoformans/gattii NOT DETECTED NOT DETECTED    Doreene Eland, PharmD, BCPS, BCIDP Work Cell: [phone] 07/31/2021 3:04 PM

## 2021-07-31 NOTE — Progress Notes (Signed)
[facility]) Hospital Liaison note:  This patient is currently enrolled in [facility] outpatient-based Palliative Care. Will continue to follow for disposition.  Please call with any outpatient palliative questions or concerns.  Thank you, Lorelee Market, LPN [facility] Liaison [phone]

## 2021-07-31 NOTE — ED Notes (Signed)
RN unable to get IV access for anbx and labs.  IV team consulted.

## 2021-07-31 NOTE — ED Notes (Signed)
IV team at bedside 

## 2021-08-01 LAB — URINE CULTURE: Culture: 4000 — AB

## 2021-08-01 NOTE — NC FL2 (Signed)
[place] LEVEL OF CARE SCREENING TOOL     IDENTIFICATION  Patient Name: Louis Reid Birthdate: July 05, 1932 Sex: male Admission Date (Current Location): 07/29/2021  [facility] and [place] Number:  Engineering geologist and Address:  [facility], [address] [place], [place] [address]      Provider Number: [id number]  Attending Physician Name and Address:  No att. providers found  Relative Name and Phone Number:  Christapher Gillian - son- [phone]    Current Level of Care: Hospital Recommended Level of Care: Impact Prior Approval Number:    Date Approved/Denied:   PASRR Number:    Discharge Plan: SNF    Current Diagnoses: Patient Active Problem List   Diagnosis Date Noted   COVID-19 virus infection 07/19/2021   Decubital ulcer 04/04/2021   AKI (acute kidney injury) ([place]) 04/02/2021   Splenic infarct    Hepatic steatosis    Stage 3b chronic kidney disease ([place]) 03/11/2021   Unable to care for self 03/11/2021   Constipation 03/11/2021   Failure to thrive in adult 03/11/2021   Protein-calorie malnutrition, severe 02/22/2021   Generalized weakness    Palliative care encounter    Goals of care, counseling/discussion    Encounter for hospice care discussion    Heart failure ([place]) 02/28/2018   Decreased cardiac ejection fraction 01/10/2018   Nonspecific chest pain    Chronic systolic CHF (congestive heart failure) ([place])    Unstable angina ([place]):  (01/20/2017) a. 3-v CAD w/ patent SVG to diag and patent sequential SVG to OM 2 and OM 3. Patent prox LAD stent. LIMA to LAD and SVG to RCA known occl. stable prox RCA sten  01/19/2017   Paroxysmal atrial fibrillation (HCC) 03/17/2016   Claudication ([place]) 01/22/2014   Chronic kidney disease 12/26/2013   ICD (implantable cardioverter-defibrillator) in place 02/16/2013   Postop check 07/05/2012   STEMI (ST elevation myocardial infarction) ([place]) 05/18/2012   Anemia in  chronic kidney disease(285.21) 05/17/2012   PE (pulmonary embolism) 05/17/2012   Physical deconditioning 05/17/2012   Anorexia 05/17/2012   Cardiogenic shock ([place]) 05/16/2012   Arterial hypotension 05/16/2012   Ventricular fibrillation arrest 05/10/2012   Cardiac arrest ([place]) 05/10/2012   Acute respiratory failure with hypoxia 2/2 cardiac arrest 05/10/2012   Acute encephalopathy 2/2 cardiac arrest 05/10/2012   Hyperglycemia 05/10/2012   Odynophagia 05/07/2012   Coronary artery disease 07/09/2011   History of coronary artery bypass surgery 07/09/2011   DYSPNEA 06/04/2010   Hyperlipidemia, unspecified 04/17/2009   Essential hypertension 04/17/2009   Asymptomatic old MI (myocardial infarction) 04/17/2009   TOBACCO USE, QUIT 04/17/2009    Orientation RESPIRATION BLADDER Height & Weight     Self, Time, Situation, Place  Normal Continent Weight: 49.1 kg Height:  5\' 11"  (180.3 cm)  BEHAVIORAL SYMPTOMS/MOOD NEUROLOGICAL BOWEL NUTRITION STATUS      Continent Diet (Carb Modified- 1600-2000 cal / day)  AMBULATORY STATUS COMMUNICATION OF NEEDS Skin   Limited Assist Verbally PU Stage and Appropriate Care   PU Stage 2 Dressing:  (sacrum foam dressing)                   Personal Care Assistance Level of Assistance  Bathing, Feeding, Dressing Bathing Assistance: Limited assistance Feeding assistance: Independent Dressing Assistance: Limited assistance     Functional Limitations Info  Sight, Hearing, Speech Sight Info: Adequate Hearing Info: Adequate Speech Info: Adequate    SPECIAL CARE FACTORS FREQUENCY  PT (By licensed PT), OT (By  licensed OT)     PT Frequency: as determined at facility OT Frequency: as determined at facility            Contractures Contractures Info: Not present    Additional Factors Info  Code Status, Allergies Code Status Info: DNR Allergies Info: NKA           Current Medications (08/01/2021):  This is the current hospital active  medication list Current Facility-Administered Medications  Medication Dose Route Frequency Provider Last Rate Last Admin   acetaminophen (TYLENOL) tablet 500 mg  500 mg Oral Q6H PRN Vanessa [place], MD       apixaban Arne [place]) tablet 2.5 mg  2.5 mg Oral BID Vanessa [place], MD   2.5 mg at 08/01/21 4163   ascorbic acid (VITAMIN C) tablet 500 mg  500 mg Oral Daily Vanessa [place], MD   500 mg at 08/01/21 8453   cefTRIAXone (ROCEPHIN) 2 g in sodium chloride 0.9 % 100 mL IVPB  2 g Intravenous Q24H Naaman Plummer, MD   Stopped at 07/31/21 2116   cholecalciferol (VITAMIN D3) tablet 2,000 Units  2,000 Units Oral Daily Vanessa [place], MD   2,000 Units at 08/01/21 6468   feeding supplement (ENSURE ENLIVE / ENSURE PLUS) liquid 237 mL  237 mL Oral TID BM Vanessa [place], MD   237 mL at 07/31/21 2004   ferrous sulfate tablet 325 mg  325 mg Oral Q breakfast Vanessa [place], MD   325 mg at 08/01/21 0321   finasteride (PROSCAR) tablet 5 mg  5 mg Oral Daily Vanessa [place], MD   5 mg at 08/01/21 2248   furosemide (LASIX) tablet 10 mg  10 mg Oral Daily Vanessa C[place], MD   10 mg at 08/01/21 2500   mirtazapine (REMERON) tablet 15 mg  15 mg Oral QHS Vanessa [place], MD   15 mg at 07/31/21 2156   ondansetron (ZOFRAN) tablet 4 mg  4 mg Oral Q8H PRN Vanessa [place], MD       rosuvastatin (CRESTOR) tablet 20 mg  20 mg Oral Q2000 Vanessa [place], MD   20 mg at 07/30/21 2144   senna-docusate (Senokot-S) tablet 1 tablet  1 tablet Oral BID Vanessa [place], MD   1 tablet at 08/01/21 3704   zinc sulfate capsule 220 mg  220 mg Oral Daily Vanessa , MD   220 mg at 08/01/21 0911   Current Outpatient Medications  Medication Sig Dispense Refill   apixaban (ELIQUIS) 2.5 MG TABS tablet Take 1 tablet (2.5 mg total) by mouth 2 (two) times daily. 60 tablet 0   ascorbic acid (VITAMIN C) 500 MG tablet Take 1 tablet (500 mg total) by mouth daily. 30 tablet 0   Cholecalciferol (D3-1000) 25 MCG (1000 UT) capsule Take 2,000 Units by mouth daily.      ferrous sulfate 325 (65 FE) MG tablet Take 325 mg by mouth daily with breakfast.     finasteride (PROSCAR) 5 MG tablet Take 5 mg by mouth daily.     furosemide (LASIX) 20 MG tablet Take 10 mg by mouth daily.     Multiple Vitamins-Minerals (ICAPS AREDS 2 PO) Take 1 capsule by mouth 2 (two) times daily.     rosuvastatin (CRESTOR) 20 MG tablet TAKE 1 TABLET BY MOUTH EVERY DAY 90 tablet 3   senna-docusate (SENOKOT-S) 8.6-50 MG tablet Take 1 tablet by mouth 2 (two) times daily.     zinc sulfate  220 (50 Zn) MG capsule Take 220 mg by mouth daily.     acetaminophen (TYLENOL) 500 MG tablet Take 1 tablet (500 mg total) by mouth every 6 (six) hours as needed for mild pain (or Fever >/= 101). 30 tablet 0   feeding supplement (ENSURE ENLIVE / ENSURE PLUS) LIQD Take 237 mLs by mouth 3 (three) times daily between meals. 237 mL 12   mirtazapine (REMERON) 15 MG tablet Take 1 tablet (15 mg total) by mouth at bedtime. 30 tablet 0   ondansetron (ZOFRAN) 4 MG tablet Take 1 tablet (4 mg total) by mouth every 8 (eight) hours as needed for nausea or vomiting. 20 tablet 0     Discharge Medications: Please see discharge summary for a list of discharge medications.  Relevant Imaging Results:  Relevant Lab Results:   Additional Information [id number]  Shelbie Hutching, RN

## 2021-08-01 NOTE — ED Provider Notes (Signed)
Today's Vitals   07/30/21 2141 07/31/21 0525 07/31/21 0700 07/31/21 0951  BP: 105/72 117/74  120/74  Pulse: 61 73  72  Resp: 20 19  19   Temp: 98 F (36.7 C) 97.9 F (36.6 C)  98 F (36.7 C)  TempSrc: Oral Oral  Oral  SpO2: 98% 99%  99%  Weight:      Height:      PainSc:   Asleep    Body mass index is 15.1 kg/m.   Patient resting comfortably.  No acute events overnight.  Awaiting social work disposition.   Dorinda Stehr, Delice Bison, DO 08/01/21 [phone]

## 2021-08-01 NOTE — TOC Transition Note (Signed)
Transition of Care [facility]) - CM/SW Discharge Note   Patient Details  Name: Louis Reid MRN: [id number] Date of Birth: Oct 25, 1932  Transition of Care [facility]) CM/SW Contact:  Shelbie Hutching, RN Phone Number: 08/01/2021, 9:21 AM   Clinical Narrative:    Patient will discharge to [facility] in [place].  Patient will be going to room [place] has arranged for [facility] to transport.  ER RN will call report to [phone].   Final next level of care: Skilled Nursing Facility Barriers to Discharge: Barriers Resolved   Patient Goals and CMS Choice Patient states their goals for this hospitalization and ongoing recovery are:: Patient wants skilled care- he says that he can't take care of himself CMS Medicare.gov Compare Post Acute Care list provided to:: Patient Choice offered to / list presented to : Patient  Discharge Placement   Existing PASRR number confirmed : 07/30/21          Patient chooses bed at: [facility] Patient to be transferred to facility by: [facility] Name of family member notified: [name] Patient and family notified of of transfer: 08/01/21  Discharge Plan and Services   Discharge Planning Services: CM Consult Post Acute Care Choice: [facility]          DME Arranged: N/A DME Agency: NA       HH Arranged: NA HH Agency: NA        Social Determinants of Health (SDOH) Interventions     Readmission Risk Interventions No flowsheet data found.

## 2021-08-01 NOTE — ED Notes (Signed)
Patient bed wet. Liens and patient pants changed at this time. Warm blankets given and urinal emptied. No needs expressed to RN. Call light within reach.

## 2021-08-01 NOTE — ED Notes (Signed)
Patient sleeping at this time. Respirations even and unlabored. NAD noted. Breakfast tray placed at bedside.

## 2021-08-03 LAB — CULTURE, BLOOD (ROUTINE X 2): Culture: NO GROWTH

## 2021-08-13 LAB — MISC LABCORP TEST (SEND OUT)
LabCorp test name: 182345
Labcorp test code: 182345

## 2021-08-15 ENCOUNTER — Non-Acute Institutional Stay: Payer: Medicare Other | Admitting: Hospice

## 2021-08-15 ENCOUNTER — Other Ambulatory Visit: Payer: Self-pay

## 2021-08-15 DIAGNOSIS — I5042 Chronic combined systolic (congestive) and diastolic (congestive) heart failure: Secondary | ICD-10-CM

## 2021-08-15 DIAGNOSIS — R5381 Other malaise: Secondary | ICD-10-CM

## 2021-08-15 DIAGNOSIS — E43 Unspecified severe protein-calorie malnutrition: Secondary | ICD-10-CM

## 2021-08-15 DIAGNOSIS — Z515 Encounter for palliative care: Secondary | ICD-10-CM

## 2021-08-15 NOTE — Progress Notes (Signed)
? ?  ? ? ?Manufacturing engineer ?Community Palliative Care Consult Note ?Telephone: [phone]  ?Fax: [phone] ? ?PATIENT NAME: Louis Reid ?DOB: Jan 02, 1933 ?MRN: [id number] ? ?PRIMARY CARE PROVIDER:   [name], MD ?Louis Downing, MD ?[address] ?[place],  [place] [address] ? ?REFERRING PROVIDER: [name], MD ?Louis Downing, MD ?[address] ?[place],  [place] [address] ? ?RESPONSIBLE PARTY:  Louis Reid ?Contact Information   ? ? Name Relation Home Work Mobile  ? Reid,Louis E Son   [phone]  ? ?  ? ? ?Visit is to build trust and highlight Palliative Medicine as specialized medical care for people living with serious illness, aimed at facilitating better quality of life through symptoms relief, assisting with advance care planning and complex medical decision making. This is a follow up visit. ? ?RECOMMENDATIONS/PLAN: ?Discussion with patient and Louis Reid on de-escalation of care from disease focused to comfort measures. ? ?CODE STATUS: Discussion with patient and with Louis Reid on code status. They elect DNR.  Discussed with Louis Reid facility SW on change from Full Code status to DNR; also discussed with nurse Louis Reid to update their records per facility protocol.  ? ? ?Goals of Care: Goals of care include to maximize quality of life and symptom management.  Patient requesting hospice service due to worsening decline in functional status. NP discussed this wish with [place] and with the SW. Facility will fax in order for hospice service to [facility].  ? ?Visit consisted of counseling and education dealing with the complex and emotionally intense issues of symptom management and palliative care in the setting of serious and potentially life-threatening illness. Palliative care team will continue to support patient, patient's family, and medical team. ? ?I spent 46 minutes providing this consultation; this includes time spent with  patient/family. More than 50% of the time in this consultation was spent on counseling and coordinating communication  ?---------------------------------------------------------------------------------------------------------- ?Symptom management/Plan:  ?Protein caloric malnutrition:  He is cachetic, height 48fet 11 inches, weight is 102 Pounds down from 120 Ibs 6 months ago.  ?Debility: related to decompensated combined diastolic and systolic CHF with associated chronic fatigue/weakness, poor appetite, weight loss. Two hospitalization last month for weakness. Family requesting hospice service. Continue ongoing supportive care.  ?CHF: Patient with decompensated combined systolic and diastolic CHF, poor response to treatment with diuretic, vasodilator and ARB. Last echocardiogram August 2021 showed ejection fraction 20 to 25%.  He has worsening fatigue, weight loss, decreased appetite and worsening dyspnea on exertion. He is no longer able to carry out minimal physical activity without symptoms of HF.  He reports shortness of breath with air hunger on minimal exertion; Oe supplementation 2L/Min prn dyspnea.  ?Follow up: Palliative care will continue to follow for complex medical decision making, advance care planning, and clarification of goals. Return 6 weeks or prn. Encouraged to call provider sooner with any concerns. ? ?CHIEF COMPLAINT: Palliative follow up ? ?HISTORY OF PRESENT ILLNESS:  Louis BEITLERa 88y.o. male with Multiple morbidities requiring close monitoring/management with high risk of complications and morbidity: combined diastolic and systolic congestive heart failure, hypertensive heart disease with systolic CHF, debility, protein caloric malnutrition.  Patient reports overall decline in his functional status-increasing weakness/fatigue, increasing shortness of breath on minimal exertion, decreasing appetite and increasing weight loss.  History obtained from review of EMR, discussion with primary  team, family and/or patient. Records reviewed and summarized above. All 10 point systems reviewed and are negative except as  documented in history of present illness above ? ?Review and summarization of Epic records shows history from other than patient.  ? ?Palliative Care was asked to follow this patient o help address complex decision making in the context of advance care planning and goals of care clarification.  ? ? ?PERTINENT MEDICATIONS:  ?Outpatient Encounter Medications as of 08/15/2021  ?Medication Sig  ? acetaminophen (TYLENOL) 500 MG tablet Take 1 tablet (500 mg total) by mouth every 6 (six) hours as needed for mild pain (or Fever >/= 101).  ? apixaban (ELIQUIS) 2.5 MG TABS tablet Take 1 tablet (2.5 mg total) by mouth 2 (two) times daily.  ? ascorbic acid (VITAMIN C) 500 MG tablet Take 1 tablet (500 mg total) by mouth daily.  ? Cholecalciferol (D3-1000) 25 MCG (1000 UT) capsule Take 2,000 Units by mouth daily.  ? feeding supplement (ENSURE ENLIVE / ENSURE PLUS) LIQD Take 237 mLs by mouth 3 (three) times daily between meals.  ? ferrous sulfate 325 (65 FE) MG tablet Take 325 mg by mouth daily with breakfast.  ? finasteride (PROSCAR) 5 MG tablet Take 5 mg by mouth daily.  ? furosemide (LASIX) 20 MG tablet Take 10 mg by mouth daily.  ? mirtazapine (REMERON) 15 MG tablet Take 1 tablet (15 mg total) by mouth at bedtime.  ? Multiple Vitamins-Minerals (ICAPS AREDS 2 PO) Take 1 capsule by mouth 2 (two) times daily.  ? ondansetron (ZOFRAN) 4 MG tablet Take 1 tablet (4 mg total) by mouth every 8 (eight) hours as needed for nausea or vomiting.  ? rosuvastatin (CRESTOR) 20 MG tablet TAKE 1 TABLET BY MOUTH EVERY DAY  ? senna-docusate (SENOKOT-S) 8.6-50 MG tablet Take 1 tablet by mouth 2 (two) times daily.  ? zinc sulfate 220 (50 Zn) MG capsule Take 220 mg by mouth daily.  ? ?No facility-administered encounter medications on file as of 08/15/2021.  ? ? ?HOSPICE ELIGIBILITY/DIAGNOSIS: TBD ? ?PAST MEDICAL HISTORY:  ?Past  Medical History:  ?Diagnosis Date  ? AICD (automatic cardioverter/defibrillator) present 09/13/2012  ? Anemia   ? CAD (coronary artery disease)   ? a. s/p remote CABG, b. AL STEMI c/b VF arrest in 05/2012 => LHC 05/10/12: Severe 3v CAD, S-OM1/2 ok, SVG-Dx ok, SVG-RCA occluded, LIMA-LAD atretic, proximal LAD occluded. EF 20% with anterior apical HK=> salvage PCI with POBA and thrombectomy of the occluded LAD;  c. 01/2017: atretic LIMA_LAD and known occluded SVG-RCA. Patent LAD stent and 70% proxRCA stenosis (FFR 0.88).   ? Chronic systolic heart failure ([place])   ? a. Echo 12/13: EF 20-25%, anteroseptal, inferior, apical HK, mildly reduced RV function, trivial pericardial effusion;   b.  Echo 3/14:  Ant, septal, apical AK, EF 25%  ? Clotting disorder ([place])   ? Depression   ? "years ago" (02/28/2018)  ? History of kidney stones   ? HLD (hyperlipidemia)   ? Hypertension   ? "I've never had high blood pressure; on RX for other reasons" (02/28/2018)  ? Inguinal hernia   ? "have one now on my left side" (09/13/2012); (02/28/2018)  ? Ischemic cardiomyopathy   ? Myocardial infarction [facility]) 1996;  05/2012  ? Osteoporosis   ? Paroxysmal atrial fibrillation ([place]) 03/17/2016  ? noted on device interrogation,  will follow burden  ? Pectus excavatum   ? Pulmonary embolism ([place])   ? a. after adhesiolysis for SBO in 04/2012 => coumadin for 6 mos  ? Renal cyst   ? SBO (small bowel obstruction) [facility]) November 2013  ?  s/p lysis of adhesions  ? Sinoatrial node dysfunction (H[facility])   ? Skin cancer   ? "left ear; face" (02/28/2018)  ? Ventricular fibrillation ([place]) 05/2012  ? Marland Kitchen in setting of AL STEMI 12/13 => EF 20-25% => d/c on LifeVest (repeat echo planned in 08/2012)  ?  ? ?ALLERGIES: No Known Allergies   ? ?Thank you for the opportunity to participate in the care of KAYDENN MCLEAR Please call our office at [phone] if we can be of additional assistance. ? ?Note: Portions of this note were generated with Lobbyist. Dictation  errors may occur despite best attempts at proofreading. ? ?Teodoro Spray, NP ? ?  ?

## 2021-08-25 LAB — ORGANISM ID BY MALDI

## 2021-08-25 LAB — ANAEROBIC ID BY MALDI

## 2021-09-30 ENCOUNTER — Non-Acute Institutional Stay: Payer: Medicare Other | Admitting: Hospice

## 2021-09-30 DIAGNOSIS — E43 Unspecified severe protein-calorie malnutrition: Secondary | ICD-10-CM

## 2021-09-30 DIAGNOSIS — Z515 Encounter for palliative care: Secondary | ICD-10-CM

## 2021-09-30 DIAGNOSIS — I5022 Chronic systolic (congestive) heart failure: Secondary | ICD-10-CM

## 2021-09-30 NOTE — Progress Notes (Signed)
?  ? ? ?Manufacturing engineer ?[facility] Consult Note ?Telephone: [phone]  ?Fax: [phone] ? ?PATIENT NAME: Louis Reid ?DOB: 09-06-1932 ?MRN: [id number] ? ?PRIMARY CARE PROVIDER:   [name], MD ?[name], MD ?[address] ?[place],  [place] [address] ? ?REFERRING PROVIDER: [name], MD ?[name], MD ?[address] ?[place],  [place] [address] ? ?RESPONSIBLE PARTY:  Louis Reid ?Contact Information   ? ? Name Relation Home Work Mobile  ? Soutar,Louis Reid   [phone]  ? ?  ? ? ?Visit is to build trust and highlight Palliative Medicine as specialized medical care for people living with serious illness, aimed at facilitating better quality of life through symptoms relief, assisting with advance care planning and complex medical decision making. This is a follow up visit. ?Patient was recently discharged from hospice service 09/29/21. ?NP called Louis Reid and updated him on visit.  Discussion on advance care planning and clarification of goals. ? ?RECOMMENDATIONS/PLAN: ? ?CODE STATUS: Patient is a Do Not Resuscitate ? ? ?Goals of Care: Goals of care include to maximize quality of life and symptom management.  ? ?Visit consisted of counseling and education dealing with the complex and emotionally intense issues of symptom management and palliative care in the setting of serious and potentially life-threatening illness. Palliative care team will continue to support patient, patient's family, and medical team. ? ?I spent 16 minutes providing this consultation; this includes time spent with patient/family. More than 50% of the time in this consultation was spent on counseling and coordinating communication  ?---------------------------------------------------------------------------------------------------------- ?Symptom management/Plan:  ?Protein caloric malnutrition:  Patient reports improving appetite; weight gain and more energy.   Current albumin 3.5.  Continue Boost BID, medpass. Continue with Mirtazapine as ordered to help boost appetite.  Routine CBC CMP. ?CHF: Managed with Lasix. Elevate BLE. Monitor for edema, check weight closely and report weight gain of 2 Ib in a day or 5 Ib in a week.  ? ?Follow up: Palliative care will continue to follow for complex medical decision making, advance care planning, and clarification of goals. Return 6 weeks or prn. Encouraged to call provider sooner with any concerns. ? ?CHIEF COMPLAINT: Palliative follow up ? ?HISTORY OF PRESENT ILLNESS:  Louis Reid a 87 y.o. male with Multiple morbidities requiring close monitoring/management with high risk of complications and morbidity: combined diastolic and systolic congestive heart failure, hypertensive heart disease with systolic CHF, debility, protein caloric malnutrition.  Patient denies pain/discomfort, no shortness of breath, currently off oxygen supplementation.  History obtained from review of EMR, discussion with primary team, family and/or patient. Records reviewed and summarized above. All 10 point systems reviewed and are negative except as documented in history of present illness above ? ?Review and summarization of Epic records shows history from other than patient.  ? ?Palliative Care was asked to follow this patient o help address complex decision making in the context of advance care planning and goals of care clarification.  ? ?Physical Exam: ?Height/Weight: 5 feet 11 inches/111.6 Ibs up from 102 Ib last month.  ?Constitutional: NAD ?General: Well groomed, cooperative ?EYES: anicteric sclera, lids intact, no discharge  ?ENMT: Moist mucous membrane ?CV: S1 S2, RRR, no LE edema ?Pulmonary: LCTA, no increased work of breathing, no cough, ?Abdomen: active BS + 4 quadrants, soft and non tender ?GU: no suprapubic tenderness ?MSK: sarcopenia, ambulatory with rolling walker ?Skin: warm and dry, no rashes or wounds on visible skin ?Neuro:  weakness,  otherwise non focal ?Psych: non-anxious affect ?Hem/lymph/immuno: no widespread bruising ?PERTINENT MEDICATIONS:  ?Outpatient Encounter Medications as of 09/30/2021  ?Medication Sig  ? acetaminophen (TYLENOL) 500 MG tablet Take 1 tablet (500 mg total) by mouth every 6 (six) hours as needed for mild pain (or Fever >/= 101).  ? apixaban (ELIQUIS) 2.5 MG TABS tablet Take 1 tablet (2.5 mg total) by mouth 2 (two) times daily.  ? ascorbic acid (VITAMIN C) 500 MG tablet Take 1 tablet (500 mg total) by mouth daily.  ? Cholecalciferol (D3-1000) 25 MCG (1000 UT) capsule Take 2,000 Units by mouth daily.  ? feeding supplement (ENSURE ENLIVE / ENSURE PLUS) LIQD Take 237 mLs by mouth 3 (three) times daily between meals.  ? ferrous sulfate 325 (65 FE) MG tablet Take 325 mg by mouth daily with breakfast.  ? finasteride (PROSCAR) 5 MG tablet Take 5 mg by mouth daily.  ? furosemide (LASIX) 20 MG tablet Take 10 mg by mouth daily.  ? mirtazapine (REMERON) 15 MG tablet Take 1 tablet (15 mg total) by mouth at bedtime.  ? Multiple Vitamins-Minerals (ICAPS AREDS 2 PO) Take 1 capsule by mouth 2 (two) times daily.  ? ondansetron (ZOFRAN) 4 MG tablet Take 1 tablet (4 mg total) by mouth every 8 (eight) hours as needed for nausea or vomiting.  ? rosuvastatin (CRESTOR) 20 MG tablet TAKE 1 TABLET BY MOUTH EVERY DAY  ? senna-docusate (SENOKOT-S) 8.6-50 MG tablet Take 1 tablet by mouth 2 (two) times daily.  ? zinc sulfate 220 (50 Zn) MG capsule Take 220 mg by mouth daily.  ? ?No facility-administered encounter medications on file as of 09/30/2021.  ? ? ?HOSPICE ELIGIBILITY/DIAGNOSIS: TBD ? ?PAST MEDICAL HISTORY:  ?Past Medical History:  ?Diagnosis Date  ? AICD (automatic cardioverter/defibrillator) present 09/13/2012  ? Anemia   ? CAD (coronary artery disease)   ? a. s/p remote CABG, b. AL STEMI c/b VF arrest in 05/2012 => LHC 05/10/12: Severe 3v CAD, S-OM1/2 ok, SVG-Dx ok, SVG-RCA occluded, LIMA-LAD atretic, proximal LAD occluded. EF 20% with  anterior apical HK=> salvage PCI with POBA and thrombectomy of the occluded LAD;  c. 01/2017: atretic LIMA_LAD and known occluded SVG-RCA. Patent LAD stent and 70% proxRCA stenosis (FFR 0.88).   ? Chronic systolic heart failure ([place])   ? a. Echo 12/13: EF 20-25%, anteroseptal, inferior, apical HK, mildly reduced RV function, trivial pericardial effusion;   b.  Echo 3/14:  Ant, septal, apical AK, EF 25%  ? Clotting disorder ([place])   ? Depression   ? "years ago" (02/28/2018)  ? History of kidney stones   ? HLD (hyperlipidemia)   ? Hypertension   ? "I've never had high blood pressure; on RX for other reasons" (02/28/2018)  ? Inguinal hernia   ? "have one now on my left side" (09/13/2012); (02/28/2018)  ? Ischemic cardiomyopathy   ? Myocardial infarction [facility]) 1996;  05/2012  ? Osteoporosis   ? Paroxysmal atrial fibrillation ([place]) 03/17/2016  ? noted on device interrogation,  will follow burden  ? Pectus excavatum   ? Pulmonary embolism ([place])   ? a. after adhesiolysis for SBO in 04/2012 => coumadin for 6 mos  ? Renal cyst   ? SBO (small bowel obstruction) [facility]) November 2013  ? s/p lysis of adhesions  ? Sinoatrial node dysfunction (H[facility])   ? Skin cancer   ? "left ear; face" (02/28/2018)  ? Ventricular fibrillation ([place]) 05/2012  ? [place] Kitchen in setting of AL STEMI 12/13 => EF 20-25% =>  d/c on LifeVest (repeat echo planned in 08/2012)  ?  ?ALLERGIES: No Known Allergies   ? ?Thank you for the opportunity to participate in the care of Louis Reid Please call our office at [phone] if we can be of additional assistance. ? ?Note: Portions of this note were generated with Lobbyist. Dictation errors may occur despite best attempts at proofreading. ? ?Teodoro Spray, NP ? ?  ?

## 2021-10-31 ENCOUNTER — Telehealth: Payer: Self-pay | Admitting: Hospice

## 2021-10-31 NOTE — Telephone Encounter (Signed)
Attempted to contact patient to schedule Palliative f/u visit (post discharge from [facility]), no answer - left message with reason for call along with my name and call back number.  I then called and spoke with son Santina Evans and I explained why I was calling and told him that I had called and left message with patient and he said that he doesn't know how to check his voicemail.  He said that we need to contact the neighbors if he doesn't answer, so they can let him know to call us.

## 2021-11-06 ENCOUNTER — Telehealth: Payer: Self-pay

## 2021-11-06 NOTE — Telephone Encounter (Signed)
Spoke with patient and scheduled a in person Palliative Consult for 11/14/21 @ 11:30 AM.   Consent obtained; updated Netsmart, Team List and Epic.

## 2021-11-13 LAB — CULTURE, BLOOD (ROUTINE X 2)

## 2021-11-14 ENCOUNTER — Other Ambulatory Visit: Payer: Medicare Other | Admitting: Hospice

## 2021-11-14 DIAGNOSIS — R630 Anorexia: Secondary | ICD-10-CM

## 2021-11-14 DIAGNOSIS — Z515 Encounter for palliative care: Secondary | ICD-10-CM

## 2021-11-14 DIAGNOSIS — E43 Unspecified severe protein-calorie malnutrition: Secondary | ICD-10-CM

## 2021-11-14 DIAGNOSIS — I5042 Chronic combined systolic (congestive) and diastolic (congestive) heart failure: Secondary | ICD-10-CM

## 2021-11-14 NOTE — Progress Notes (Signed)
[facility] Consult Note Telephone: [phone]  Fax: [phone]  PATIENT NAME: Louis Reid DOB: 16-Mar-1933 MRN: [id number]  PRIMARY CARE PROVIDER:   [name], MD Louis Downing, MD [address] [place],  [place] [address]  REFERRING PROVIDER: [name], MD Louis Downing, MD [address] [place],  [place] [address]  RESPONSIBLE PARTY:  Louis Reid [phone] Contact Information     Name Relation Home Work Mobile   Louis, Reid   [phone]       Visit is to build trust and highlight Palliative Medicine as specialized medical care for people living with serious illness, aimed at facilitating better quality of life through symptoms relief, assisting with advance care planning and complex medical decision making. This is a follow up visit.  Patient was recently discharged from hospice May 2023 for improving in his weight and functional status.   RECOMMENDATIONS/PLAN:  CODE STATUS: Reviewed CODE STATUS, discussing the ramifications and implications of CODE STATUS.  Patient affirmed he is a Do Not Resuscitate  Goals of Care: Goals of care include to maximize quality of life and symptom management. Patient wishes to stay strong and continue to live in his home.   Visit consisted of counseling and education dealing with the complex and emotionally intense issues of symptom management and palliative care in the setting of serious and potentially life-threatening illness. Palliative care team will continue to support patient, patient's family, and medical team.  I spent 16 minutes providing this consultation; this includes time spent with patient/family. More than 50% of the time in this consultation was spent on counseling and coordinating communication   ---------------------------------------------------------------------------------------------------------- Symptom management/Plan:  Protein caloric malnutrition:  current weight 119 pounds up from 111.6 pounds April 2023, height 5 feet 11 inches. Current albumin 3.5.  Continue Boost BID. Routine CBC CMP. Poor appetite: Continue with Mirtazapine as ordered to help boost appetite.   CHF: Managed with Lasix.  Adhere to salt and fluid limits.  Elevate BLE. Monitor for edema, check weight closely and report weight gain of 2 Ib in a day or 5 Ib in a week.   Follow up: Palliative care will continue to follow for complex medical decision making, advance care planning, and clarification of goals. Return 6 weeks or prn. Encouraged to call provider sooner with any concerns.  CHIEF COMPLAINT: Palliative follow up  HISTORY OF PRESENT ILLNESS:  Louis Reid a 88 y.o. male with Multiple morbidities requiring close monitoring/management with high risk of complications and morbidity: combined diastolic and systolic congestive heart failure, hypertensive heart disease with systolic CHF, debility, protein caloric malnutrition.  Patient recently discharged from hospice service.  In no acute distress. History obtained from review of EMR, discussion with primary team, family and/or patient. Records reviewed and summarized above. All 10 point systems reviewed and are negative except as documented in history of present illness above  Review and summarization of Epic records shows history from other than patient.   Palliative Care was asked to follow this patient o help address complex decision making in the context of advance care planning and goals of care clarification.   Physical Exam: Constitutional: NAD General: Well groomed, cooperative EYES: anicteric sclera, lids intact, no discharge  ENMT: Moist mucous membrane CV: S1 S2, RRR, no LE edema Pulmonary: LCTA, no increased work of breathing, no  cough, Abdomen: active BS + 4 quadrants, soft and non tender GU: no suprapubic tenderness  MSK: sarcopenia, ambulatory with rolling walker Skin: warm and dry, no rashes or wounds on visible skin Neuro:  weakness, otherwise non focal, alert and oriented x3 Psych: non-anxious affect Hem/lymph/immuno: no widespread bruising PERTINENT MEDICATIONS:  Outpatient Encounter Medications as of 11/14/2021  Medication Sig   acetaminophen (TYLENOL) 500 MG tablet Take 1 tablet (500 mg total) by mouth every 6 (six) hours as needed for mild pain (or Fever >/= 101).   apixaban (ELIQUIS) 2.5 MG TABS tablet Take 1 tablet (2.5 mg total) by mouth 2 (two) times daily.   ascorbic acid (VITAMIN C) 500 MG tablet Take 1 tablet (500 mg total) by mouth daily.   Cholecalciferol (D3-1000) 25 MCG (1000 UT) capsule Take 2,000 Units by mouth daily.   feeding supplement (ENSURE ENLIVE / ENSURE PLUS) LIQD Take 237 mLs by mouth 3 (three) times daily between meals.   ferrous sulfate 325 (65 FE) MG tablet Take 325 mg by mouth daily with breakfast.   finasteride (PROSCAR) 5 MG tablet Take 5 mg by mouth daily.   furosemide (LASIX) 20 MG tablet Take 10 mg by mouth daily.   mirtazapine (REMERON) 15 MG tablet Take 1 tablet (15 mg total) by mouth at bedtime.   Multiple Vitamins-Minerals (ICAPS AREDS 2 PO) Take 1 capsule by mouth 2 (two) times daily.   ondansetron (ZOFRAN) 4 MG tablet Take 1 tablet (4 mg total) by mouth every 8 (eight) hours as needed for nausea or vomiting.   rosuvastatin (CRESTOR) 20 MG tablet TAKE 1 TABLET BY MOUTH EVERY DAY   senna-docusate (SENOKOT-S) 8.6-50 MG tablet Take 1 tablet by mouth 2 (two) times daily.   zinc sulfate 220 (50 Zn) MG capsule Take 220 mg by mouth daily.   No facility-administered encounter medications on file as of 11/14/2021.    HOSPICE ELIGIBILITY/DIAGNOSIS: TBD  PAST MEDICAL HISTORY:  Past Medical History:  Diagnosis Date   AICD (automatic cardioverter/defibrillator) present 09/13/2012    Anemia    CAD (coronary artery disease)    a. s/p remote CABG, b. AL STEMI c/b VF arrest in 05/2012 => LHC 05/10/12: Severe 3v CAD, S-OM1/2 ok, SVG-Dx ok, SVG-RCA occluded, LIMA-LAD atretic, proximal LAD occluded. EF 20% with anterior apical HK=> salvage PCI with POBA and thrombectomy of the occluded LAD;  c. 01/2017: atretic LIMA_LAD and known occluded SVG-RCA. Patent LAD stent and 70% proxRCA stenosis (FFR 0.88).    Chronic systolic heart failure ([place])    a. Echo 12/13: EF 20-25%, anteroseptal, inferior, apical HK, mildly reduced RV function, trivial pericardial effusion;   b.  Echo 3/14:  Ant, septal, apical AK, EF 25%   Clotting disorder ([place])    Depression    "years ago" (02/28/2018)   History of kidney stones    HLD (hyperlipidemia)    Hypertension    "I've never had high blood pressure; on RX for other reasons" (02/28/2018)   Inguinal hernia    "have one now on my left side" (09/13/2012); (02/28/2018)   Ischemic cardiomyopathy    Myocardial infarction [facility]) 1996;  05/2012   Osteoporosis    Paroxysmal atrial fibrillation ([place]) 03/17/2016   noted on device interrogation,  will follow burden   Pectus excavatum    Pulmonary embolism ([place])    a. after adhesiolysis for SBO in 04/2012 => coumadin for 6 mos   Renal cyst    SBO (small bowel obstruction) [facility]) November 2013   s/p lysis of adhesions   Sinoatrial node dysfunction ([facility])    Skin cancer    "  left ear; face" (02/28/2018)   Ventricular fibrillation ([place]) 05/2012   . in setting of AL STEMI 12/13 => EF 20-25% => d/c on LifeVest (repeat echo planned in 08/2012)    ALLERGIES: No Known Allergies    Thank you for the opportunity to participate in the care of TYLERJAMES HOGLUND Please call our office at [phone] if we can be of additional assistance.  Note: Portions of this note were generated with Lobbyist. Dictation errors may occur despite best attempts at proofreading.  Teodoro Spray, NP

## 2021-12-13 NOTE — Progress Notes (Unsigned)
Cardiology Office Note:    Date:  12/13/2021   ID:  Louis Reid, DOB 1932-11-03, MRN [id number]  PCP:  [name], MD   [place] Providers Cardiologist:  Kirk Ruths, MD Cardiology APP:  Georgiana Shore, NP (Inactive) { Click to update primary MD,subspecialty MD or APP then REFRESH:1}    Referring MD: [name], *   No chief complaint on file. ***  History of Present Illness:    Louis Reid is a 88 y.o. male with a hx of CAD s/p CABG 1996, STEMI 05/2012 with patent SVG-OM1-OM 2, SVG-D1, and occluded SVG-RCA and atretic LIMA-LAD with thrombectomy of proximal LAD occlusion, no significant changes on cath in 01/2017.  He has a history of ischemic cardiomyopathy s/p ICD placement 2014, chronic combined systolic and diastolic heart failure, hypertension, and hyperlipidemia.  He has a history of PAF on Eliquis, CKD, and has been on palliative care.   He was admitted from clinic 02/28/2018 with concerns for low output heart failure.  RHC showed low filling pressures and mildly reduced CO.  He was given gentle IVF.  Patient refused milrinone and palliative care was consulted.  He was discharged from hospice and has continued palliative care.  Lisinopril stopped due to fatigue.  Patient lives in [place] and [place] has been unable to follow his device remotely due to bad cell phone service.  Echocardiogram 01/2020 with an LVEF of 20 to 25%, normal RV, and no significant valvular disease.  He was admitted 03/2021 with fecal impaction and weakness.  He was discharged to a SNF.  Unfortunately he was readmitted shortly thereafter with volume depletion and AKI with sacral decubiti.  Patient refused to go back to his SNF and [place] home.  He was seen by EP in 05/2021 and wanted to have ICD left on.  No A-fib on device interrogation.  He was admitted 07/18/2021 with COVID-19 infection and discharged 07/25/21 to home with home health..  He was seen back in the ER in 07/29/21 and  reported weakness and inability to perform most ADLs and was ready for SNF.  Palliative care has continue to follow.  He presents today for routine follow-up.    CAD s/p CABG Ischemic cardiomyopathy Chronic systolic and diastolic heart failure ICD in place CKD stage IIIb GDMT: Crestor, 10 mg Lasix   PAF Chronic anticoagulation Continue 2.5 mg Eliquis twice daily   FTT Palliative following    Past Medical History:  Diagnosis Date   AICD (automatic cardioverter/defibrillator) present 09/13/2012   Anemia    CAD (coronary artery disease)    a. s/p remote CABG, b. AL STEMI c/b VF arrest in 05/2012 => LHC 05/10/12: Severe 3v CAD, S-OM1/2 ok, SVG-Dx ok, SVG-RCA occluded, LIMA-LAD atretic, proximal LAD occluded. EF 20% with anterior apical HK=> salvage PCI with POBA and thrombectomy of the occluded LAD;  c. 01/2017: atretic LIMA_LAD and known occluded SVG-RCA. Patent LAD stent and 70% proxRCA stenosis (FFR 0.88).    Chronic systolic heart failure ([place])    a. Echo 12/13: EF 20-25%, anteroseptal, inferior, apical HK, mildly reduced RV function, trivial pericardial effusion;   b.  Echo 3/14:  Ant, septal, apical AK, EF 25%   Clotting disorder (HCC)    Depression    "years ago" (02/28/2018)   History of kidney stones    HLD (hyperlipidemia)    Hypertension    "I've never had high blood pressure; on RX for other reasons" (02/28/2018)   Inguinal hernia    "  have one now on my left side" (09/13/2012); (02/28/2018)   Ischemic cardiomyopathy    Myocardial infarction [facility]) 1996;  05/2012   Osteoporosis    Paroxysmal atrial fibrillation ([place]) 03/17/2016   noted on device interrogation,  will follow burden   Pectus excavatum    Pulmonary embolism ([place])    a. after adhesiolysis for SBO in 04/2012 => coumadin for 6 mos   Renal cyst    SBO (small bowel obstruction) [facility]) November 2013   s/p lysis of adhesions   Sinoatrial node dysfunction ([place])    Skin cancer    "left ear; face" (02/28/2018)    Ventricular fibrillation ([place]) 05/2012   . in setting of AL STEMI 12/13 => EF 20-25% => d/c on LifeVest (repeat echo planned in 08/2012)    Past Surgical History:  Procedure Laterality Date   CATARACT EXTRACTION W/ INTRAOCULAR LENS  IMPLANT, BILATERAL Bilateral    COLONOSCOPY     CORONARY ANGIOPLASTY WITH STENT PLACEMENT     "I've got 2 or 3 stents" (02/28/2018)   [place]   "CABG X5" (09/13/2012)   CYSTOSCOPY/RETROGRADE/URETEROSCOPY/STONE EXTRACTION WITH BASKET  1990's   ESOPHAGOGASTRODUODENOSCOPY  05/08/2012   Procedure: ESOPHAGOGASTRODUODENOSCOPY (EGD);  Surgeon: Juanita Craver, MD;  Location: [facility] ENDOSCOPY;  Service: Endoscopy;  Laterality: N/A;  Nevin Bloodgood put on for Dr. Collene Mares , Dr. Collene Mares will call if she wants another time   IMPLANTABLE CARDIOVERTER DEFIBRILLATOR IMPLANT N/A 09/13/2012   SJM Ellipse ER implanted by Dr Rayann Heman for secondary prevention   INGUINAL HERNIA REPAIR Right 1996   INTRAVASCULAR PRESSURE WIRE/FFR STUDY N/A 01/20/2017   Procedure: INTRAVASCULAR PRESSURE WIRE/FFR STUDY;  Surgeon: Wellington Hampshire, MD;  Location: [place] CV LAB;  Service: Cardiovascular;  Laterality: N/A;   LAPAROTOMY  04/27/2012   Procedure: EXPLORATORY LAPAROTOMY;  Surgeon: Gwenyth Ober, MD;  Location: [place];  Service: General;  Laterality: N/A;   LEFT HEART CATH AND CORS/GRAFTS ANGIOGRAPHY N/A 01/20/2017   Procedure: LEFT HEART CATH AND CORS/GRAFTS ANGIOGRAPHY;  Surgeon: Wellington Hampshire, MD;  Location: [place] CV LAB;  Service: Cardiovascular;  Laterality: N/A;   LEFT HEART CATHETERIZATION WITH CORONARY ANGIOGRAM N/A 05/10/2012   Procedure: LEFT HEART CATHETERIZATION WITH CORONARY ANGIOGRAM;  Surgeon: Burnell Blanks, MD;  Location: [facility] CATH LAB;  Service: Cardiovascular;  Laterality: N/A;   PTCA     percutaneous transluminal coronary intervention and brachy therapy, Bruce R. Olevia Perches, MD. EF 60%   RIGHT HEART CATH N/A 03/01/2018   Procedure: RIGHT HEART CATH;  Surgeon:  Jolaine Artist, MD;  Location: [place] CV LAB;  Service: Cardiovascular;  Laterality: N/A;   SKIN CANCER EXCISION     "left ear; face" (02/28/2018)   TRANSURETHRAL RESECTION OF PROSTATE      Current Medications: No outpatient medications have been marked as taking for the 12/15/21 encounter (Appointment) with Ledora Bottcher, [place].     Allergies:   Patient has no known allergies.   Social History   Socioeconomic History   Marital status: Widowed    Spouse name: Not on file   Number of children: 1   Years of education: Not on file   Highest education level: Not on file  Occupational History   Occupation: [name]    Employer: RETIRED  Tobacco Use   Smoking status: Former    Types: Cigarettes   Smokeless tobacco: Never   Tobacco comments:    09/14/2011; 02/28/2018  "quit smoking when I was ~  17; probably didn't smoke 10 packs in my life"  Vaping Use   Vaping Use: Never used  Substance and Sexual Activity   Alcohol use: Never   Drug use: Never   Sexual activity: Not Currently  Other Topics Concern   Not on file  Social History Narrative   Not on file   Social Determinants of Health   Financial Resource Strain: Low Risk  (01/29/2021)   Overall Financial Resource Strain (CARDIA)    Difficulty of Paying Living Expenses: Not very hard  Food Insecurity: No Food Insecurity (01/27/2021)   Hunger Vital Sign    Worried About Running Out of Food in the Last Year: Never true    [place] in the Last Year: Never true  Transportation Needs: No Transportation Needs (01/27/2021)   PRAPARE - Hydrologist (Medical): No    Lack of Transportation (Non-Medical): No  Physical Activity: Not on file  Stress: Not on file  Social Connections: Not on file     Family History: The patient's ***family history includes Cancer in his brother; Colon cancer in his brother; Heart disease in his brother and father; Other in his mother.  ROS:    Please see the history of present illness.    *** All other systems reviewed and are negative.  EKGs/Labs/Other Studies Reviewed:    The following studies were reviewed today:   Echo 01/2020   1. Left ventricular ejection fraction, by estimation, is 20 to 25%. The  left ventricle has severely decreased function. The left ventricle has no  regional wall motion abnormalities. The left ventricular internal cavity  size was mildly dilated. Left  ventricular diastolic parameters are indeterminate.   2. Right ventricular systolic function is normal. The right ventricular  size is normal. There is normal pulmonary artery systolic pressure.   3. The mitral valve is normal in structure. Trivial mitral valve  regurgitation. No evidence of mitral stenosis.   4. The aortic valve is normal in structure. Aortic valve regurgitation is  not visualized. No aortic stenosis is present.   5. The inferior vena cava is dilated in size with <50% respiratory  variability, suggesting right atrial pressure of 15 mmHg.   EKG:  EKG is *** ordered today.  The ekg ordered today demonstrates ***  Recent Labs: 04/04/2021: Magnesium 1.8 07/18/2021: ALT 35; TSH 5.639 07/29/2021: B Natriuretic Peptide 466.1; BUN 33; Creatinine, Ser 1.50; Potassium 4.1; Sodium 141 07/31/2021: Hemoglobin 12.7; Platelets 112  Recent Lipid Panel    Component Value Date/Time   CHOL 89 (L) 08/24/2016 0931   TRIG 65 08/24/2016 0931   HDL 37 (L) 08/24/2016 0931   CHOLHDL 2.4 08/24/2016 0931   CHOLHDL 2.4 07/29/2015 0837   VLDL 15 07/29/2015 0837   LDLCALC 39 08/24/2016 0931     Risk Assessment/Calculations:   {Does this patient have ATRIAL FIBRILLATION?[phone]}       Physical Exam:    VS:  There were no vitals taken for this visit.    Wt Readings from Last 3 Encounters:  07/29/21 108 lb 3.9 oz (49.1 kg)  07/25/21 108 lb 3.9 oz (49.1 kg)  05/12/21 130 lb 6.4 oz (59.1 kg)     GEN: *** Well nourished, well developed in  no acute distress HEENT: Normal NECK: No JVD; No carotid bruits LYMPHATICS: No lymphadenopathy CARDIAC: ***RRR, no murmurs, rubs, gallops RESPIRATORY:  Clear to auscultation without rales, wheezing or rhonchi  ABDOMEN: Soft, non-tender, non-distended MUSCULOSKELETAL:  No  edema; No deformity  SKIN: Warm and dry NEUROLOGIC:  Alert and oriented x 3 PSYCHIATRIC:  Normal affect   ASSESSMENT:    No diagnosis found. PLAN:    In order of problems listed above:  ***      {Are you ordering a CV Procedure (e.g. stress test, cath, DCCV, TEE, etc)?   Press F2        :[id number]}    Medication Adjustments/Labs and Tests Ordered: Current medicines are reviewed at length with the patient today.  Concerns regarding medicines are outlined above.  No orders of the defined types were placed in this encounter.  No orders of the defined types were placed in this encounter.   There are no Patient Instructions on file for this visit.   Signed, [place], PA  12/13/2021 2:12 PM    [facility]

## 2021-12-15 ENCOUNTER — Encounter: Payer: Self-pay | Admitting: Physician Assistant

## 2021-12-15 ENCOUNTER — Ambulatory Visit: Payer: Medicare Other | Admitting: Physician Assistant

## 2021-12-15 VITALS — BP 152/81 | HR 88 | Ht 71.0 in | Wt 140.2 lb

## 2021-12-15 DIAGNOSIS — M25461 Effusion, right knee: Secondary | ICD-10-CM

## 2021-12-15 DIAGNOSIS — I5022 Chronic systolic (congestive) heart failure: Secondary | ICD-10-CM | POA: Diagnosis not present

## 2021-12-15 DIAGNOSIS — I2510 Atherosclerotic heart disease of native coronary artery without angina pectoris: Secondary | ICD-10-CM

## 2021-12-15 DIAGNOSIS — M25462 Effusion, left knee: Secondary | ICD-10-CM

## 2021-12-15 DIAGNOSIS — Z95810 Presence of automatic (implantable) cardiac defibrillator: Secondary | ICD-10-CM

## 2021-12-15 DIAGNOSIS — Z951 Presence of aortocoronary bypass graft: Secondary | ICD-10-CM

## 2021-12-15 DIAGNOSIS — I480 Paroxysmal atrial fibrillation: Secondary | ICD-10-CM

## 2021-12-15 DIAGNOSIS — N1832 Chronic kidney disease, stage 3b: Secondary | ICD-10-CM

## 2021-12-15 DIAGNOSIS — Z7901 Long term (current) use of anticoagulants: Secondary | ICD-10-CM

## 2021-12-15 DIAGNOSIS — I255 Ischemic cardiomyopathy: Secondary | ICD-10-CM

## 2021-12-15 NOTE — Patient Instructions (Signed)
Medication Instructions:  No Changes *If you need a refill on your cardiac medications before your next appointment, please call your pharmacy*   Lab Work: No Labs If you have labs (blood work) drawn today and your tests are completely normal, you will receive your results only by: [place] (if you have MyChart) OR A paper copy in the mail If you have any lab test that is abnormal or we need to change your treatment, we will call you to review the results.   Testing/Procedures: No Testing   Follow-Up: At [facility], you and your health needs are our priority.  As part of our continuing mission to provide you with exceptional heart care, we have created designated Provider Care Teams.  These Care Teams include your primary Cardiologist (physician) and Advanced Practice Providers (APPs -  Physician Assistants and Nurse Practitioners) who all work together to provide you with the care you need, when you need it.  We recommend signing up for the patient portal called "MyChart".  Sign up information is provided on this After Visit Summary.  MyChart is used to connect with patients for Virtual Visits (Telemedicine).  Patients are able to view lab/test results, encounter notes, upcoming appointments, etc.  Non-urgent messages can be sent to your provider as well.   To learn more about what you can do with MyChart, go to [email or web address].    Your next appointment:   4-5 month(s)  The format for your next appointment:   In Person  Provider:   Kirk Ruths, MD     Other Instructions Recommend Thigh High Compression Stockings.  Important Information About [place]

## 2022-01-01 ENCOUNTER — Other Ambulatory Visit: Payer: Medicare Other | Admitting: *Deleted

## 2022-01-01 DIAGNOSIS — Z515 Encounter for palliative care: Secondary | ICD-10-CM

## 2022-01-01 NOTE — Progress Notes (Signed)
[place] PALLIATIVE CARE RN NOTE  PATIENT NAME: Louis Reid DOB: 1932-07-29 MRN: [id number]  PRIMARY CARE PROVIDER: [name], MD  RESPONSIBLE PARTY: Wonda Horner (son) Acct ID - Guarantor Home Phone Work Phone Relationship Acct Type  [id number] CAMRIN, GEARHEART* [phone]  Self P/F     [place], [place], [place] [address]   RN telephonic encounter completed with patient's son [name] today. He is requesting a welfare check on his dad. He reports that the minister was visiting with the patient yesterday and patient informed the minister that he had a fall on his screened in back porch. He was scooting some furniture from the porch towards the door so he could prop it open to get more air. Son doesn't understand why patient wanted to do this because he has a fully operational air conditioning unit in his home. He hit his arm and head during the fall. Son says patient is ok and nothing is fractured, however he has some bruising to his arm. Patient is still driving. Palliative care team will visit patient early next week to follow up. Son feels it is ok to wait until then to visit. He just wanted to keep [place] informed. Update provided to Laverda Sorenson NP.   Daryl Eastern, RN BSN

## 2022-01-02 ENCOUNTER — Other Ambulatory Visit: Payer: Medicare Other | Admitting: *Deleted

## 2022-01-02 DIAGNOSIS — Z515 Encounter for palliative care: Secondary | ICD-10-CM

## 2022-01-02 NOTE — Progress Notes (Deleted)
Electrophysiology Office Note Date: 01/02/2022  ID:  [name], [name] 1933/02/05, MRN [id number]  PCP: [name], MD Primary Cardiologist: Kirk Ruths, MD Electrophysiologist: Thompson Grayer, MD ***  CC: Routine ICD follow-up  Louis Reid is a 88 y.o. male seen today for Thompson Grayer, MD for {Blank single:19197::"cardiac clearance","post hospital follow up","acute visit due to ***","routine electrophysiology followup"}.  Since {Blank single:19197::"last being seen in our clinic","discharge from hospital"} the patient reports doing ***.  he denies chest pain, palpitations, dyspnea, PND, orthopnea, nausea, vomiting, dizziness, syncope, edema, weight gain, or early satiety. {He/she (caps):30048} has not had ICD shocks.   Device History: St. Jude Dual Chamber ICD implanted 09/2012 for chronic systolic CHF/ICM History of appropriate therapy: No History of AAD therapy: No  Past Medical History:  Diagnosis Date   AICD (automatic cardioverter/defibrillator) present 09/13/2012   Anemia    CAD (coronary artery disease)    a. s/p remote CABG, b. AL STEMI c/b VF arrest in 05/2012 => LHC 05/10/12: Severe 3v CAD, S-OM1/2 ok, SVG-Dx ok, SVG-RCA occluded, LIMA-LAD atretic, proximal LAD occluded. EF 20% with anterior apical HK=> salvage PCI with POBA and thrombectomy of the occluded LAD;  c. 01/2017: atretic LIMA_LAD and known occluded SVG-RCA. Patent LAD stent and 70% proxRCA stenosis (FFR 0.88).    Chronic systolic heart failure ([place])    a. Echo 12/13: EF 20-25%, anteroseptal, inferior, apical HK, mildly reduced RV function, trivial pericardial effusion;   b.  Echo 3/14:  Ant, septal, apical AK, EF 25%   Clotting disorder ([place])    Depression    "years ago" (02/28/2018)   History of kidney stones    HLD (hyperlipidemia)    Hypertension    "I've never had high blood pressure; on RX for other reasons" (02/28/2018)   Inguinal hernia    "have one now on my left side" (09/13/2012);  (02/28/2018)   Ischemic cardiomyopathy    Myocardial infarction [facility]) 1996;  05/2012   Osteoporosis    Paroxysmal atrial fibrillation ([place]) 03/17/2016   noted on device interrogation,  will follow burden   Pectus excavatum    Pulmonary embolism ([place])    a. after adhesiolysis for SBO in 04/2012 => coumadin for 6 mos   Renal cyst    SBO (small bowel obstruction) [facility]) November 2013   s/p lysis of adhesions   Sinoatrial node dysfunction ([place])    Skin cancer    "left ear; face" (02/28/2018)   Ventricular fibrillation ([place]) 05/2012   . in setting of AL STEMI 12/13 => EF 20-25% => d/c on LifeVest (repeat echo planned in 08/2012)   Past Surgical History:  Procedure Laterality Date   CATARACT EXTRACTION W/ INTRAOCULAR LENS  IMPLANT, BILATERAL Bilateral    COLONOSCOPY     CORONARY ANGIOPLASTY WITH STENT PLACEMENT     "I've got 2 or 3 stents" (02/28/2018)   [place]   "CABG X5" (09/13/2012)   CYSTOSCOPY/RETROGRADE/URETEROSCOPY/STONE EXTRACTION WITH BASKET  1990's   ESOPHAGOGASTRODUODENOSCOPY  05/08/2012   Procedure: ESOPHAGOGASTRODUODENOSCOPY (EGD);  Surgeon: Juanita Craver, MD;  Location: [facility] ENDOSCOPY;  Service: Endoscopy;  Laterality: N/A;  Nevin Bloodgood put on for Dr. Collene Mares , Dr. Collene Mares will call if she wants another time   IMPLANTABLE CARDIOVERTER DEFIBRILLATOR IMPLANT N/A 09/13/2012   SJM Ellipse ER implanted by Dr Rayann Heman for secondary prevention   INGUINAL HERNIA REPAIR Right 1996   INTRAVASCULAR PRESSURE WIRE/FFR STUDY N/A 01/20/2017   Procedure: INTRAVASCULAR PRESSURE WIRE/FFR STUDY;  Surgeon:  Wellington Hampshire, MD;  Location: [place] CV LAB;  Service: Cardiovascular;  Laterality: N/A;   LAPAROTOMY  04/27/2012   Procedure: EXPLORATORY LAPAROTOMY;  Surgeon: Gwenyth Ober, MD;  Location: [place];  Service: General;  Laterality: N/A;   LEFT HEART CATH AND CORS/GRAFTS ANGIOGRAPHY N/A 01/20/2017   Procedure: LEFT HEART CATH AND CORS/GRAFTS ANGIOGRAPHY;  Surgeon: Wellington Hampshire,  MD;  Location: [place] CV LAB;  Service: Cardiovascular;  Laterality: N/A;   LEFT HEART CATHETERIZATION WITH CORONARY ANGIOGRAM N/A 05/10/2012   Procedure: LEFT HEART CATHETERIZATION WITH CORONARY ANGIOGRAM;  Surgeon: Burnell Blanks, MD;  Location[facility] C[facility] LAB;  Service: Cardiovascular;  Laterality: N/A;   PTCA     percutaneous transluminal coronary intervention and brachy therapy, Bruce R. Olevia Perches, MD. EF 60%   RIGHT HEART CATH N/A 03/01/2018   Procedure: RIGHT HEART CATH;  Surgeon: Jolaine Artist, MD;  Location: [place] CV LAB;  Service: Cardiovascular;  Laterality: N/A;   SKIN CANCER EXCISION     "left ear; face" (02/28/2018)   TRANSURETHRAL RESECTION OF PROSTATE      Current Outpatient Medications  Medication Sig Dispense Refill   acetaminophen (TYLENOL) 500 MG tablet Take 1 tablet (500 mg total) by mouth every 6 (six) hours as needed for mild pain (or Fever >/= 101). 30 tablet 0   apixaban (ELIQUIS) 2.5 MG TABS tablet Take 1 tablet (2.5 mg total) by mouth 2 (two) times daily. 60 tablet 0   ascorbic acid (VITAMIN C) 500 MG tablet Take 1 tablet (500 mg total) by mouth daily. 30 tablet 0   Cholecalciferol (D3-1000) 25 MCG (1000 UT) capsule Take 2,000 Units by mouth daily.     feeding supplement (ENSURE ENLIVE / ENSURE PLUS) LIQD Take 237 mLs by mouth 3 (three) times daily between meals. 237 mL 12   ferrous sulfate 325 (65 FE) MG tablet Take 325 mg by mouth daily with breakfast.     finasteride (PROSCAR) 5 MG tablet Take 5 mg by mouth daily.     furosemide (LASIX) 20 MG tablet Take 10 mg by mouth daily.     Multiple Vitamins-Minerals (ICAPS AREDS 2 PO) Take 1 capsule by mouth 2 (two) times daily.     senna-docusate (SENOKOT-S) 8.6-50 MG tablet Take 1 tablet by mouth 2 (two) times daily.     zinc sulfate 220 (50 Zn) MG capsule Take 220 mg by mouth daily.     No current facility-administered medications for this visit.    Allergies:   Patient has no active allergies.    Social History: Social History   Socioeconomic History   Marital status: Widowed    Spouse name: Not on file   Number of children: 1   Years of education: Not on file   Highest education level: Not on file  Occupational History   Occupation: [name]    Employer: RETIRED  Tobacco Use   Smoking status: Former    Types: Cigarettes   Smokeless tobacco: Never   Tobacco comments:    09/14/2011; 02/28/2018  "quit smoking when I was ~ 17; probably didn't smoke 10 packs in my life"  Vaping Use   Vaping Use: Never used  Substance and Sexual Activity   Alcohol use: Never   Drug use: Never   Sexual activity: Not Currently  Other Topics Concern   Not on file  Social History Narrative   Not on file   Social Determinants of Health   Financial Resource  Strain: Low Risk  (01/29/2021)   Overall Financial Resource Strain (CARDIA)    Difficulty of Paying Living Expenses: Not very hard  Food Insecurity: No Food Insecurity (01/27/2021)   Hunger Vital Sign    Worried About Running Out of Food in the Last Year: Never true    [place] in the Last Year: Never true  Transportation Needs: No Transportation Needs (01/27/2021)   PRAPARE - Hydrologist (Medical): No    Lack of Transportation (Non-Medical): No  Physical Activity: Not on file  Stress: Not on file  Social Connections: Not on file  Intimate Partner Violence: Not on file    Family History: Family History  Problem Relation Age of Onset   Heart disease Father    Other Mother        brain tumor   Colon cancer Brother    Cancer Brother        Liver   Heart disease Brother     Review of Systems: All other systems reviewed and are otherwise negative except as noted above.   Physical Exam: There were no vitals filed for this visit.   GEN- The patient is well appearing, alert and oriented x 3 today.   HEENT: normocephalic, atraumatic; sclera clear, conjunctiva pink; hearing  intact; oropharynx clear; neck supple, no JVP Lymph- no cervical lymphadenopathy Lungs- Clear to ausculation bilaterally, normal work of breathing.  No wheezes, rales, rhonchi Heart- Regular rate and rhythm, no murmurs, rubs or gallops, PMI not laterally displaced GI- soft, non-tender, non-distended, bowel sounds present, no hepatosplenomegaly Extremities- no clubbing or cyanosis. No edema; DP/PT/radial pulses 2+ bilaterally MS- no significant deformity or atrophy Skin- warm and dry, no rash or lesion; ICD pocket well healed Psych- euthymic mood, full affect Neuro- strength and sensation are intact  ICD interrogation- reviewed in detail today,  See PACEART report  EKG:  EKG is not ordered today. Personal review of EKG ordered  07/2021  shows ***  Recent Labs: 04/04/2021: Magnesium 1.8 07/18/2021: ALT 35; TSH 5.639 07/29/2021: B Natriuretic Peptide 466.1; BUN 33; Creatinine, Ser 1.50; Potassium 4.1; Sodium 141 07/31/2021: Hemoglobin 12.7; Platelets 112   Wt Readings from Last 3 Encounters:  12/15/21 140 lb 3.2 oz (63.6 kg)  07/29/21 108 lb 3.9 oz (49.1 kg)  07/25/21 108 lb 3.9 oz (49.1 kg)     Other studies Reviewed: Additional studies/ records that were reviewed today include: Previous EP office notes.   Assessment and Plan:  1.  Chronic systolic dysfunction s/p St. Jude dual chamber ICD  euvolemic today Stable on an appropriate medical regimen Normal ICD function See Pace Art report No changes today  2. HTN Stable on current regimen   3. AF Continue Eliquis 2.5 mg BID  4. Left knee swelling/dependent edema  Current medicines are reviewed at length with the patient today.   =  Labs/ tests ordered today include: *** No orders of the defined types were placed in this encounter.    Disposition:   Follow up with {Blank single:19197::"Dr. Allred","Dr. Arlan Organ. Klein","Dr. Camnitz","Dr. Lambert","EP APP"} in {Blank single:19197::"2 weeks","4 weeks","3 months","6  months","12 months","as usual post gen change"}    Signed, Shirley Friar, [name]  01/02/2022 1:35 PM  [address] [place] [address] [phone] (office) [phone] (fax)

## 2022-01-06 ENCOUNTER — Encounter: Payer: Medicare Other | Admitting: Student

## 2022-01-06 DIAGNOSIS — I255 Ischemic cardiomyopathy: Secondary | ICD-10-CM

## 2022-01-06 DIAGNOSIS — I2510 Atherosclerotic heart disease of native coronary artery without angina pectoris: Secondary | ICD-10-CM

## 2022-01-06 DIAGNOSIS — I480 Paroxysmal atrial fibrillation: Secondary | ICD-10-CM

## 2022-01-06 DIAGNOSIS — I5022 Chronic systolic (congestive) heart failure: Secondary | ICD-10-CM

## 2022-01-06 NOTE — Progress Notes (Signed)
[place] PALLIATIVE CARE RN NOTE  PATIENT NAME: Louis Reid DOB: 1932-12-28 MRN: [id number]  PRIMARY CARE PROVIDER: [name], MD  RESPONSIBLE PARTY: Wonda Horner (son) Acct ID - Guarantor Home Phone Work Phone Relationship Acct Type  [id number] Louis Reid, Louis Reid* [phone]  Self P/F     [place], [place], [place] [address]    RN telephonic encounter completed with patient. Patient spoke about his recent fall on his screened in back porch. He sustained a large skin tear on his arm and hit the side of his head. He says there was a large amount of bleeding coming from the head laceration, which was small. He did go to the doctor and was treated. They cleaned the area and placed a tegaderm on his skin tear and he was instructed to keep this on his arm. Head laceration has scabbed over. He was also given a tetanus shot. I advised that I wanted to make an in person visit to follow up. He states that he has a doctors appointment on Monday and some family coming in to see him from out of town next week. He requests a call in about a week to schedule visit. Palliative care will continue to follow.   Daryl Eastern, RN BSN

## 2022-01-13 ENCOUNTER — Other Ambulatory Visit: Payer: Medicare Other

## 2022-01-14 ENCOUNTER — Other Ambulatory Visit: Payer: Medicare Other

## 2022-01-14 ENCOUNTER — Other Ambulatory Visit: Payer: Medicare Other | Admitting: *Deleted

## 2022-01-14 VITALS — BP 132/78 | HR 73 | Temp 97.6°F | Resp 18

## 2022-01-14 DIAGNOSIS — Z515 Encounter for palliative care: Secondary | ICD-10-CM

## 2022-01-14 NOTE — Progress Notes (Signed)
[facility] COMMUNITY PALLIATIVE CARE RN NOTE  PATIENT NAME: Louis Reid DOB: 17-Mar-1933 MRN: [id number]  PRIMARY CARE PROVIDER: [name], MD  RESPONSIBLE PARTY: Wonda Horner (son) Acct ID - Guarantor Home Phone Work Phone Relationship Acct Type  [id number] YANIXAN, MELLINGER* [phone]  Self P/F     [place], [place], [place] [address]   RN/SW follow up visit completed with patient in his home.  Upon arrival, patient is sitting up in his recliner awake and alert. He is alert and oriented x 3. He verbalizes feeling stressed and worried and says he doesn't want to become depressed. Having difficulty sleeping. He is getting weaker and having more difficulties performing his ADLs independently. He is ambulatory without the use of assistive devices, but gait is unsteady at times. He still drives. He had a fall 2 weeks ago. Skin tear is almost completely healed on his right wrist/forearm, only 3 small scabs noted. Area on the right side of his head has healed.   He used to reside at [facility] but signed himself out AMA as he wanted to return back home. He is questioning whether or not he should go back. It is difficult for him to leave his home and the things he has worked hard for. Options discussed such as hiring in home caregivers or moving into an assisted living. He has refused both options. He feels the cost of skilled care is too much. Explained when money runs low/out he could apply for Medicaid.This option was also a no. Palliative care team will continue to follow.   CODE STATUS: DNR ADVANCED DIRECTIVES: Y MOST FORM: yes PPS: 60%   PHYSICAL EXAM:   VITALS: Today's Vitals   01/14/22 0945  BP: 132/78  Pulse: 73  Resp: 18  Temp: 97.6 F (36.4 C)  TempSrc: Temporal  SpO2: 97%  PainSc: 0-No pain    LUNGS: clear to auscultation  CARDIAC: Cor RRR EXTREMITIES: No edema SKIN:  See above note   NEURO:  Alert and oriented x 3, tearful at times, forgetful,  ambulatory   Daryl Eastern, RN BSN

## 2022-01-14 NOTE — Progress Notes (Signed)
COMMUNITY PALLIATIVE CARE SW NOTE  PATIENT NAME: Louis Reid DOB: March 03, 1933 MRN: [id number]  PRIMARY CARE PROVIDER: [name], MD  RESPONSIBLE PARTY:  Acct ID - Guarantor Home Phone Work Phone Relationship Acct Type  [id number] [name], [name]* [phone]  Self P/F     [place], [place], [place] [address]   SOCIAL WORK ENCOUNTER  PC SW and RN-M. Nadara Mustard completed a face-to-face visit with patient at his home. He ambulated to the door independently. He sat and talked with the team, providing an update on himself. Patient denied pain. He reported that he is having increased fatigue and weakness. Patient report fatigue when activity such as walking to his mailbox. He stated that he often feels depressed. Patient report that he knows he needs more help and will probably need to go into a nursing home, but had several things to get into place first. However, his goal is to remain as independent as possible. He declined SW assistance with getting in-home caregivers or completing a referral for mobile meals. Patient stated he desired to be independent, while on the other hand, he states he needs more care. Patient reluctance to accept assistance centers around his desire to keep his home and his belongings he feels he has worked hard for. Patient became tearful as he expressed his desire to have better communication with his son. SW empathized with him and encouraged him to reach out to his son to resolve any issues they may have, but they both should remain open to hearing each other. The team attempted to contact patient's son during this visit, but was unable to do so. A message requesting a return call was left for him.  Patient requested follow-up in 4-6 weeks as he remains open to ongoing palliative care visits and support.  CODE STATUS: DNR ADVANCED DIRECTIVES: Yes MOST FORM COMPLETE:  Yes HOSPICE EDUCATION PROVIDED: No  Duration of visit and documentation: 60 minutes   Breeanne Oblinger, LCSW

## 2022-01-15 ENCOUNTER — Other Ambulatory Visit: Payer: Medicare Other | Admitting: *Deleted

## 2022-01-15 ENCOUNTER — Telehealth: Payer: Self-pay

## 2022-01-15 DIAGNOSIS — Z515 Encounter for palliative care: Secondary | ICD-10-CM

## 2022-01-15 NOTE — Telephone Encounter (Signed)
425 pm.  Message received to return call to patient.  Called patient but no answer and unable to leave a message.

## 2022-01-16 ENCOUNTER — Other Ambulatory Visit: Payer: Self-pay

## 2022-01-16 ENCOUNTER — Emergency Department (HOSPITAL_COMMUNITY)
Admission: EM | Admit: 2022-01-16 | Discharge: 2022-01-16 | Disposition: A | Discharge status: Home / Self Care | Payer: Medicare Other | Attending: Emergency Medicine | Admitting: Emergency Medicine

## 2022-01-16 DIAGNOSIS — Z79899 Other long term (current) drug therapy: Secondary | ICD-10-CM | POA: Diagnosis not present

## 2022-01-16 DIAGNOSIS — Z951 Presence of aortocoronary bypass graft: Secondary | ICD-10-CM | POA: Diagnosis not present

## 2022-01-16 DIAGNOSIS — M6281 Muscle weakness (generalized): Secondary | ICD-10-CM | POA: Diagnosis not present

## 2022-01-16 DIAGNOSIS — I2510 Atherosclerotic heart disease of native coronary artery without angina pectoris: Secondary | ICD-10-CM | POA: Diagnosis not present

## 2022-01-16 DIAGNOSIS — N189 Chronic kidney disease, unspecified: Secondary | ICD-10-CM | POA: Diagnosis not present

## 2022-01-16 DIAGNOSIS — I5042 Chronic combined systolic (congestive) and diastolic (congestive) heart failure: Secondary | ICD-10-CM | POA: Diagnosis not present

## 2022-01-16 DIAGNOSIS — I130 Hypertensive heart and chronic kidney disease with heart failure and stage 1 through stage 4 chronic kidney disease, or unspecified chronic kidney disease: Secondary | ICD-10-CM | POA: Diagnosis not present

## 2022-01-16 DIAGNOSIS — Z7901 Long term (current) use of anticoagulants: Secondary | ICD-10-CM | POA: Diagnosis not present

## 2022-01-16 DIAGNOSIS — R531 Weakness: Secondary | ICD-10-CM

## 2022-01-16 LAB — URINALYSIS, ROUTINE W REFLEX MICROSCOPIC
Bacteria, UA: NONE SEEN
Bilirubin Urine: NEGATIVE
Glucose, UA: NEGATIVE mg/dL
Hgb urine dipstick: NEGATIVE
Ketones, ur: NEGATIVE mg/dL
Leukocytes,Ua: NEGATIVE
Nitrite: NEGATIVE
Protein, ur: NEGATIVE mg/dL
Specific Gravity, Urine: 1.009 (ref 1.005–1.030)
pH: 5 (ref 5.0–8.0)

## 2022-01-16 LAB — CBC
HCT: 36.4 % — ABNORMAL LOW (ref 39.0–52.0)
Hemoglobin: 11.6 g/dL — ABNORMAL LOW (ref 13.0–17.0)
MCH: 31.6 pg (ref 26.0–34.0)
MCHC: 31.9 g/dL (ref 30.0–36.0)
MCV: 99.2 fL (ref 80.0–100.0)
Platelets: 129 10*3/uL — ABNORMAL LOW (ref 150–400)
RBC: 3.67 MIL/uL — ABNORMAL LOW (ref 4.22–5.81)
RDW: 14.1 % (ref 11.5–15.5)
WBC: 4.6 10*3/uL (ref 4.0–10.5)
nRBC: 0 % (ref 0.0–0.2)

## 2022-01-16 LAB — BASIC METABOLIC PANEL
Anion gap: 8 (ref 5–15)
BUN: 26 mg/dL — ABNORMAL HIGH (ref 8–23)
CO2: 24 mmol/L (ref 22–32)
Calcium: 9.1 mg/dL (ref 8.9–10.3)
Chloride: 111 mmol/L (ref 98–111)
Creatinine, Ser: 1.23 mg/dL (ref 0.61–1.24)
GFR, Estimated: 57 mL/min — ABNORMAL LOW (ref >60)
Glucose, Bld: 104 mg/dL — ABNORMAL HIGH (ref 70–99)
Potassium: 4 mmol/L (ref 3.5–5.1)
Sodium: 143 mmol/L (ref 135–145)

## 2022-01-16 MED ORDER — SODIUM CHLORIDE 0.9 % IV BOLUS
500.0000 mL | Freq: Once | INTRAVENOUS | Status: AC
  Administered 2022-01-16: 500 mL via INTRAVENOUS

## 2022-01-16 NOTE — ED Notes (Signed)
Pt ambulated from EMS stretcher to hospital stretcher with assistance. Pt on blood thinner eliquis.

## 2022-01-16 NOTE — ED Triage Notes (Signed)
Pt bib [facility] from home c/o weakness x 3 weeks. Pt lives at home alone, recently d/c from [facility] facility. Pt having trouble with refilling medication and getting food. Pt believed "I could take care of myself on my own." Denies CP and SOB. Pt hx CHF, and afib.   EMS vitals 148/84 BP 72 HR 14 RR 97% O2 120 CBG

## 2022-01-16 NOTE — Progress Notes (Signed)
CSW spoke with patient who stated that if he is stabilized in the ED then he will go back home. Patient told CSW that he is independent. Patient stated if he is not stabilized that he will need placement. CSW stated that he would need long term care insurance coverage to go anywhere. Patient stated he has [place] long term insurance. CSW also told patient about applying for medicaid. Patient told CSW that he was not going to apply for that. CSW asked patient why he didn't want in-home caregivers and he stated he had no interest in that or assisted living. CSW told patient that she would speak with the ED provider and get back with him.     CSW contacted Olivia Mackie in admissions at [facility] to find out what happened with patient when he was there. CSW was told that patients son was not making payments and they had to issue a 30 day discharge. Patients was there from 08/01/21-5/23-23. CSW did confirm that patient was using his long term care insurance but the son wasn't paying. CSW was told the money for the policy goes to the family and then its the families responsibility to pay the facility which was not happening. Olivia Mackie also told CSW that patient does not qualify for Medicaid at this time because he has 10 classic cars that he refuses to sell. Patient refuses to spend down his assets. CSW was told patient could not come back to [facility].    CSW spoke with [facility] who also told CSW that they have tried to get patient to apply for medicaid but he refuses to spend down his money. Patients has also been offered in home caregivers, assisted living, and meals on wheels which patient has declined. CSW was also told they spoke with the son who stated that he no longer has the long term care insurance anymore due to what occurred at [facility] and that patient would have to apply for medicaid now for any type of long term care. CSW told [facility] that patient has refused to apply for medicaid. CSW informed [facility]  that patient will be discharging back home.    CSW attempted to call [place],[name] (Son) [phone] [facility]) and phone goes straight to voicemail

## 2022-01-16 NOTE — Discharge Instructions (Signed)
Please continue to work with your palliative team in terms of getting as much help as you can at home.  Discussed with them what options are available as far as care from your long-term insurance.  Call your long-term insurance to see if the policy still valid and what benefits you have from it.

## 2022-01-16 NOTE — ED Provider Notes (Signed)
[facility] EMERGENCY DEPARTMENT Provider Note   CSN: [id number] Arrival date & time: 01/16/22  2836     History  Chief Complaint  Patient presents with   Weakness    Louis Reid is a 88 y.o. male.  HPI     88 y.o. male with a hx of CAD s/p CABG 1996 with ischemic cardiomyopathy s/p ICD placement 2014, chronic combined systolic and diastolic heart failure, hypertension, and hyperlipidemia.  He has a history of PAF on Eliquis, CKD, and has been on palliative care for the last few months.  Patient states that he has been living home after his wife has passed away.  He was admitted to collapse between February and May.  He went home and has been having difficulty taking care of himself.  He gets short of breath when he tries to get to the mailbox.  He has exertional shortness of breath with cleaning in his home.  He denies any fainting or chest pain.  Review of systems negative for any nausea, vomiting, fevers, chills.  He came to the ER because he wants the hospital to stabilize him so that he wants to be admitted to nursing home.  He however states that he has long-term health insurance and does not want to pay out-of-pocket.  He does not know what policy he has or if it is active.  He has been offered home health, but he has declined and does not want anyone coming in and out of his home.   Home Medications Prior to Admission medications   Medication Sig Start Date End Date Taking? Authorizing Provider  acetaminophen (TYLENOL) 500 MG tablet Take 1 tablet (500 mg total) by mouth every 6 (six) hours as needed for mild pain (or Fever >/= 101). 03/15/21   Cherene Altes, MD  apixaban (ELIQUIS) 2.5 MG TABS tablet Take 1 tablet (2.5 mg total) by mouth 2 (two) times daily. 07/25/21   Loletha Grayer, MD  ascorbic acid (VITAMIN C) 500 MG tablet Take 1 tablet (500 mg total) by mouth daily. 07/25/21   Loletha Grayer, MD  Cholecalciferol (D3-1000) 25 MCG (1000 UT)  capsule Take 2,000 Units by mouth daily.    [provider]  feeding supplement (ENSURE ENLIVE / ENSURE PLUS) LIQD Take 237 mLs by mouth 3 (three) times daily between meals. 03/15/21   Cherene Altes, MD  ferrous sulfate 325 (65 FE) MG tablet Take 325 mg by mouth daily with breakfast.    [provider]  finasteride (PROSCAR) 5 MG tablet Take 5 mg by mouth daily. 08/30/19   [provider]  furosemide (LASIX) 20 MG tablet Take 10 mg by mouth daily.    [provider]  Multiple Vitamins-Minerals (ICAPS AREDS 2 PO) Take 1 capsule by mouth 2 (two) times daily.    [provider]  senna-docusate (SENOKOT-S) 8.6-50 MG tablet Take 1 tablet by mouth 2 (two) times daily. 03/15/21   Cherene Altes, MD  zinc sulfate 220 (50 Zn) MG capsule Take 220 mg by mouth daily.    [provider]      Allergies    Patient has no active allergies.    Review of Systems   Review of Systems  All other systems reviewed and are negative.   Physical Exam Updated Vital Signs BP (!) 146/95   Pulse 79   Temp 98.4 F (36.9 C) (Oral)   Resp 17   Ht '5\' 11"'$  (1.803 m)  Wt 63.6 kg   SpO2 98%   BMI 19.56 kg/m  Physical Exam Vitals and nursing note reviewed.  Constitutional:      Appearance: He is well-developed.  HENT:     Head: Atraumatic.     Nose: Nose normal.  Cardiovascular:     Rate and Rhythm: Normal rate.  Pulmonary:     Effort: Pulmonary effort is normal.     Breath sounds: No rales.  Abdominal:     Tenderness: There is no abdominal tenderness.  Musculoskeletal:     Cervical back: Neck supple.     Right lower leg: No edema.     Left lower leg: No edema.  Skin:    General: Skin is warm.  Neurological:     Mental Status: He is alert and oriented to person, place, and time.     ED Results / Procedures / Treatments   Labs (all labs ordered are listed, but only abnormal results are displayed) Labs Reviewed  BASIC METABOLIC PANEL -  Abnormal; Notable for the following components:      Result Value   Glucose, Bld 104 (*)    BUN 26 (*)    GFR, Estimated 57 (*)    All other components within normal limits  CBC - Abnormal; Notable for the following components:   RBC 3.67 (*)    Hemoglobin 11.6 (*)    HCT 36.4 (*)    Platelets 129 (*)    All other components within normal limits  URINALYSIS, ROUTINE W REFLEX MICROSCOPIC - Abnormal; Notable for the following components:   Color, Urine STRAW (*)    All other components within normal limits    EKG EKG Interpretation  Date/Time:  Friday January 16 2022 09:33:16 EDT Ventricular Rate:  75 PR Interval:  213 QRS Duration: 93 QT Interval:  403 QTC Calculation: 451 R Axis:   8 Text Interpretation: Sinus arrhythmia Borderline prolonged PR interval Low voltage, extremity leads Probable LVH with secondary repol abnrm No acute changes TWI in the lateral leads Confirmed by Varney Biles [phone]) on 01/16/2022 9:47:42 AM  Radiology No results found.  Procedures Procedures    Medications Ordered in ED Medications  sodium chloride 0.9 % bolus 500 mL (500 mLs Intravenous New Bag/Given 01/16/22 1025)    ED Course/ Medical Decision Making/ A&P                           Medical Decision Making Amount and/or Complexity of Data Reviewed Labs: ordered.   88 year old patient comes in with chief complaint of generalized weakness.  He is requesting that he be admitted for stabilization or placed at nursing home, but he cannot afford to pay for it and his long-term insurance should take care of it.  Patient's exam is reassuring.  He is AOx3.  There is no SIRS criteria.  Patient has already ambulated per nursing staff with assistance.  He has no evidence of any volume overload.  He does not appear dry.  History is not concerning for infection.  I reviewed patient's chart.  He is followed by [facility] care.  In fact they saw him 2 days ago.  There was conversation about increased  help at home, patient has declined.  I did ask if he has pursued being placed to nursing home by a toric care.  He states that he wants to spend as much time as home, therefore he came to the ER to see if he  can stabilize him so that he can be at home.  However, he also is thinking that he is having difficulty taking care of himself at home, and if needed he will be amenable to admission to a specific skilled nursing facility, however, he does not have funds to pay for it and his long-term insurance should take care of everything.  I consulted social work.  Social work team has assessed the patient, discussed the case with the counterpart with palliative medicine.  It appears that patient has too many assets and wants to continue to be independent during their assessments.  They are confused why he came to the ER, as just 2 days ago they had asked him if they can help with placement.  They will be happy to continue to see him.  Patient's blood work is reassuring.  We do not have any medical reason to admit him.  No evidence of severe dehydration, AKI, severe electrolyte abnormality, CHF that needs admission.  No concerns for infection.  Patient states that he does not want to go to assisted living.  He still does not want home health.  At this time, he is stable for discharge from the ER.  Final Clinical Impression(s) / ED Diagnoses Final diagnoses:  Generalized weakness    Rx / DC Orders ED Discharge Orders     None         Varney Biles, MD 01/16/22 1142

## 2022-01-16 NOTE — Progress Notes (Signed)
[facility]) Hospital Liaison note:  This patient is currently enrolled in [facility] outpatient-based Palliative Care. Will continue to follow for disposition.  Please call with any outpatient palliative questions or concerns.  Thank you, Lorelee Market, LPN [facility] Liaison [phone]

## 2022-01-19 NOTE — Progress Notes (Signed)
[place] PALLIATIVE CARE RN NOTE  PATIENT NAME: Louis Reid DOB: 04-Jun-1933 MRN: [id number]  PRIMARY CARE PROVIDER: [name], MD  RESPONSIBLE PARTY: Wonda Horner (son) Acct ID - Guarantor Home Phone Work Phone Relationship Acct Type  [id number] CONLAN, MICELI* [phone]  Self P/F     [place], [place], [place] [address]    RN telephonic encounter completed with patient's son Louis Reid. He reports that patient's minister contacted him today to advise that patient wants to go to the hospital due to weakness. Advised that myself and LCSW visited with patient yesterday and did not see a reason for him needing an ED visit. Vitals were stable and he remains ambulatory and drives. His biggest concern and stressor was related to bills and whether he needed to go back to a facility for placement due to weakness. Louis Reid appreciated this information and explained what happened when patient was at [facility] and wanted to return home. He reports that patient will not allow him to sell anything. I advised him that our SW tried to explore all options from assisted living placement, SNF placement for long term care, hired caregivers or applying for Medicaid to pay for facility placement along with meals on wheels and patient was not agreeable to any of this. Explained that we will continue to check on patient but not sure what else to do since he will not agree to above options. Son states that patient also will not allow him to sell any of his assets to help pay for outstanding bills. He feels that he is at a loss. Palliative care team will continue to follow.    Daryl Eastern, RN BSN

## 2022-01-20 ENCOUNTER — Other Ambulatory Visit: Payer: Medicare Other

## 2022-01-20 ENCOUNTER — Telehealth: Payer: Self-pay | Admitting: Cardiology

## 2022-01-20 DIAGNOSIS — Z515 Encounter for palliative care: Secondary | ICD-10-CM

## 2022-01-20 NOTE — Telephone Encounter (Signed)
Patient called to talk with nurse in regards to his BP. Please call back to discuss

## 2022-01-20 NOTE — Telephone Encounter (Signed)
Attempted to reach, went to voicemail that is full,unable to leach message.

## 2022-01-21 NOTE — Telephone Encounter (Signed)
Attempted to reach the patient. Voicemail was full.  

## 2022-01-22 NOTE — Progress Notes (Signed)
COMMUNITY PALLIATIVE CARE SW NOTE  PATIENT NAME: Louis Reid DOB: November 20, 1932 MRN: [id number]  PRIMARY CARE PROVIDER: [name], MD  RESPONSIBLE PARTY:  Acct ID - Guarantor Home Phone Work Phone Relationship Acct Type  [id number] MARSHEL, GOLUBSKI* [phone]  Self P/F     [place], [place], [place] [address]   SOCIAL WORK TELEPHONIC ENCOUNTER (11:40 am-12:00 pm)  PC SW completed a follow-up call to patient after he called in to say he wanted someone to call him back. Patient stated that he is needing more help and wanted someone to come out and talk with him about his options. He stated that he has reached out to a SNF that did not  have any available beds, but advised him he needed to consider assisted living. SW reminded patient that care options were discussed on previous visit and he declined mobile meals, assistance with getting in-home care or assisted living. He states he can't afford to pay out anymore money. He advised her had a LTC policy that could help pay for placement. SW advised him that he may have to pay a little out of pocket also. He stated He could not afford that. SW advised him to discuss his concerns with his son, who could help him make some decisions. SW provided him several assisted living facilities to call, and  also followed this up with a text of the facilities.  Patient verbalized no other concerns. SW to follow-up with patient in 1-2 weeks to assess needs and provide support.   [address] [place], [place]

## 2022-01-25 ENCOUNTER — Encounter: Payer: Self-pay | Admitting: Emergency Medicine

## 2022-01-25 ENCOUNTER — Emergency Department: Payer: Medicare Other

## 2022-01-25 ENCOUNTER — Inpatient Hospital Stay
Admission: EM | Admit: 2022-01-25 | Discharge: 2022-02-01 | DRG: 291 | Disposition: A | Discharge status: Home Health | Payer: Medicare Other | Attending: Internal Medicine | Admitting: Internal Medicine

## 2022-01-25 ENCOUNTER — Other Ambulatory Visit: Payer: Self-pay

## 2022-01-25 DIAGNOSIS — E43 Unspecified severe protein-calorie malnutrition: Secondary | ICD-10-CM | POA: Diagnosis present

## 2022-01-25 DIAGNOSIS — Z681 Body mass index (BMI) 19 or less, adult: Secondary | ICD-10-CM

## 2022-01-25 DIAGNOSIS — R64 Cachexia: Secondary | ICD-10-CM | POA: Diagnosis present

## 2022-01-25 DIAGNOSIS — M810 Age-related osteoporosis without current pathological fracture: Secondary | ICD-10-CM | POA: Diagnosis present

## 2022-01-25 DIAGNOSIS — N400 Enlarged prostate without lower urinary tract symptoms: Secondary | ICD-10-CM | POA: Diagnosis present

## 2022-01-25 DIAGNOSIS — N182 Chronic kidney disease, stage 2 (mild): Secondary | ICD-10-CM

## 2022-01-25 DIAGNOSIS — Z20822 Contact with and (suspected) exposure to covid-19: Secondary | ICD-10-CM | POA: Diagnosis present

## 2022-01-25 DIAGNOSIS — F419 Anxiety disorder, unspecified: Secondary | ICD-10-CM | POA: Diagnosis present

## 2022-01-25 DIAGNOSIS — I5084 End stage heart failure: Secondary | ICD-10-CM | POA: Diagnosis present

## 2022-01-25 DIAGNOSIS — I5043 Acute on chronic combined systolic (congestive) and diastolic (congestive) heart failure: Secondary | ICD-10-CM | POA: Diagnosis present

## 2022-01-25 DIAGNOSIS — Z87891 Personal history of nicotine dependence: Secondary | ICD-10-CM

## 2022-01-25 DIAGNOSIS — I130 Hypertensive heart and chronic kidney disease with heart failure and stage 1 through stage 4 chronic kidney disease, or unspecified chronic kidney disease: Principal | ICD-10-CM | POA: Diagnosis present

## 2022-01-25 DIAGNOSIS — Z86711 Personal history of pulmonary embolism: Secondary | ICD-10-CM

## 2022-01-25 DIAGNOSIS — Z95810 Presence of automatic (implantable) cardiac defibrillator: Secondary | ICD-10-CM | POA: Diagnosis present

## 2022-01-25 DIAGNOSIS — I5023 Acute on chronic systolic (congestive) heart failure: Secondary | ICD-10-CM | POA: Diagnosis not present

## 2022-01-25 DIAGNOSIS — Z85828 Personal history of other malignant neoplasm of skin: Secondary | ICD-10-CM

## 2022-01-25 DIAGNOSIS — R531 Weakness: Secondary | ICD-10-CM | POA: Diagnosis not present

## 2022-01-25 DIAGNOSIS — N1832 Chronic kidney disease, stage 3b: Secondary | ICD-10-CM | POA: Diagnosis present

## 2022-01-25 DIAGNOSIS — Z8249 Family history of ischemic heart disease and other diseases of the circulatory system: Secondary | ICD-10-CM

## 2022-01-25 DIAGNOSIS — Z951 Presence of aortocoronary bypass graft: Secondary | ICD-10-CM

## 2022-01-25 DIAGNOSIS — R141 Gas pain: Secondary | ICD-10-CM | POA: Diagnosis present

## 2022-01-25 DIAGNOSIS — R627 Adult failure to thrive: Secondary | ICD-10-CM | POA: Diagnosis present

## 2022-01-25 DIAGNOSIS — I2510 Atherosclerotic heart disease of native coronary artery without angina pectoris: Secondary | ICD-10-CM | POA: Diagnosis present

## 2022-01-25 DIAGNOSIS — E785 Hyperlipidemia, unspecified: Secondary | ICD-10-CM | POA: Diagnosis present

## 2022-01-25 DIAGNOSIS — I252 Old myocardial infarction: Secondary | ICD-10-CM | POA: Diagnosis not present

## 2022-01-25 DIAGNOSIS — Z955 Presence of coronary angioplasty implant and graft: Secondary | ICD-10-CM

## 2022-01-25 DIAGNOSIS — Z66 Do not resuscitate: Secondary | ICD-10-CM | POA: Diagnosis present

## 2022-01-25 DIAGNOSIS — D696 Thrombocytopenia, unspecified: Secondary | ICD-10-CM | POA: Diagnosis present

## 2022-01-25 DIAGNOSIS — Z79899 Other long term (current) drug therapy: Secondary | ICD-10-CM

## 2022-01-25 DIAGNOSIS — I5022 Chronic systolic (congestive) heart failure: Secondary | ICD-10-CM

## 2022-01-25 DIAGNOSIS — I255 Ischemic cardiomyopathy: Secondary | ICD-10-CM | POA: Diagnosis present

## 2022-01-25 DIAGNOSIS — Z800 Family history of malignant neoplasm of digestive organs: Secondary | ICD-10-CM

## 2022-01-25 DIAGNOSIS — Z8616 Personal history of COVID-19: Secondary | ICD-10-CM | POA: Diagnosis not present

## 2022-01-25 DIAGNOSIS — I2699 Other pulmonary embolism without acute cor pulmonale: Secondary | ICD-10-CM | POA: Diagnosis present

## 2022-01-25 DIAGNOSIS — Z7901 Long term (current) use of anticoagulants: Secondary | ICD-10-CM | POA: Diagnosis not present

## 2022-01-25 DIAGNOSIS — I480 Paroxysmal atrial fibrillation: Secondary | ICD-10-CM | POA: Diagnosis present

## 2022-01-25 DIAGNOSIS — I10 Essential (primary) hypertension: Secondary | ICD-10-CM | POA: Diagnosis present

## 2022-01-25 HISTORY — DX: Chronic systolic (congestive) heart failure: I50.22

## 2022-01-25 LAB — CBC
HCT: 37.2 % — ABNORMAL LOW (ref 39.0–52.0)
Hemoglobin: 11.9 g/dL — ABNORMAL LOW (ref 13.0–17.0)
MCH: 30.9 pg (ref 26.0–34.0)
MCHC: 32 g/dL (ref 30.0–36.0)
MCV: 96.6 fL (ref 80.0–100.0)
Platelets: 115 10*3/uL — ABNORMAL LOW (ref 150–400)
RBC: 3.85 MIL/uL — ABNORMAL LOW (ref 4.22–5.81)
RDW: 14.2 % (ref 11.5–15.5)
WBC: 6.7 10*3/uL (ref 4.0–10.5)
nRBC: 0 % (ref 0.0–0.2)

## 2022-01-25 LAB — BASIC METABOLIC PANEL
Anion gap: 6 (ref 5–15)
BUN: 34 mg/dL — ABNORMAL HIGH (ref 8–23)
CO2: 26 mmol/L (ref 22–32)
Calcium: 9.4 mg/dL (ref 8.9–10.3)
Chloride: 111 mmol/L (ref 98–111)
Creatinine, Ser: 1.17 mg/dL (ref 0.61–1.24)
GFR, Estimated: >60 mL/min (ref >60)
Glucose, Bld: 153 mg/dL — ABNORMAL HIGH (ref 70–99)
Potassium: 4.1 mmol/L (ref 3.5–5.1)
Sodium: 143 mmol/L (ref 135–145)

## 2022-01-25 LAB — URINALYSIS, ROUTINE W REFLEX MICROSCOPIC
Bilirubin Urine: NEGATIVE
Glucose, UA: NEGATIVE mg/dL
Hgb urine dipstick: NEGATIVE
Ketones, ur: NEGATIVE mg/dL
Leukocytes,Ua: NEGATIVE
Nitrite: NEGATIVE
Protein, ur: NEGATIVE mg/dL
Specific Gravity, Urine: 1.02 (ref 1.005–1.030)
pH: 5 (ref 5.0–8.0)

## 2022-01-25 LAB — BRAIN NATRIURETIC PEPTIDE: B Natriuretic Peptide: 389.1 pg/mL — ABNORMAL HIGH (ref 0.0–100.0)

## 2022-01-25 LAB — SARS CORONAVIRUS 2 BY RT PCR: SARS Coronavirus 2 by RT PCR: NEGATIVE

## 2022-01-25 MED ORDER — FUROSEMIDE 10 MG/ML IJ SOLN
40.0000 mg | Freq: Once | INTRAMUSCULAR | Status: AC
Start: 2022-01-25 — End: 2022-01-25
  Administered 2022-01-25: 40 mg via INTRAVENOUS
  Filled 2022-01-25: qty 4

## 2022-01-25 NOTE — ED Notes (Signed)
Pt taken to CT at this time.

## 2022-01-25 NOTE — H&P (Signed)
History and Physical    Patient: Louis Reid QVZ:[id number] DOB: [birth year] DOA: 01/25/2022 DOS: the patient was seen and examined on 01/25/2022 PCP: [name], MD  Patient coming from: Home  Chief Complaint:  Chief Complaint  Patient presents with   Weakness   HPI: Louis Reid is a 88 y.o. male with medical history significant for history of coronary disease status post CABG in 1996, STEMI in 2013, ischemic cardiomyopathy, end-stage combined diastolic and systolic CHF with last documented LVEF of 20 to 25%, status post AICD in 2014, hypertension PAF on anti-coagulation and hyperlipidemia.  At baseline, patient lives by himself and is followed by the palliative care team at home.  He has had progressive worsening exertional dyspnea over the past several weeks.  Last seen in the ED on 01/16/2022 on account of shortness of breath.  Patient was seen evaluated and discharged home.  He returns to the emergency room today, barely, 9 days after on account of worsening shortness of breath.  At this visit however, patient complains of his defibrillator going off.  This he describes as making him feel miserable.  He lives by himself and feels he is unable to take care of himself now.  He is requesting to be possibly placed in a skilled nursing facility.  He was admitted in February 2023 on account of COVID-19 infection.  Lisinopril was apparently discontinued on account of fatigue.  He was also seen in cardiology clinic on 12/15/2021.  In the ED today, device was interrogated and as reported in ED, no reported arrhythmias.  AICD battery however was noted to be low(3 months remaining).  He however denies any chest pain on this admission.  Denies any worsening lower extremity edema.  Review of Systems: As mentioned in the history of present illness. All other systems reviewed and are negative. Past Medical History:  Diagnosis Date   AICD (automatic cardioverter/defibrillator) present  09/13/2012   Anemia    CAD (coronary artery disease)    a. s/p remote CABG, b. AL STEMI c/b VF arrest in 05/2012 => LHC 05/10/12: Severe 3v CAD, S-OM1/2 ok, SVG-Dx ok, SVG-RCA occluded, LIMA-LAD atretic, proximal LAD occluded. EF 20% with anterior apical HK=> salvage PCI with POBA and thrombectomy of the occluded LAD;  c. 01/2017: atretic LIMA_LAD and known occluded SVG-RCA. Patent LAD stent and 70% proxRCA stenosis (FFR 0.88).    Chronic systolic heart failure ([place])    a. Echo 12/13: EF 20-25%, anteroseptal, inferior, apical HK, mildly reduced RV function, trivial pericardial effusion;   b.  Echo 3/14:  Ant, septal, apical AK, EF 25%   Clotting disorder ([place])    Depression    "years ago" (02/28/2018)   History of kidney stones    HLD (hyperlipidemia)    Hypertension    "I've never had high blood pressure; on RX for other reasons" (02/28/2018)   Inguinal hernia    "have one now on my left side" (09/13/2012); (02/28/2018)   Ischemic cardiomyopathy    Myocardial infarction [facility]) 1996;  05/2012   Osteoporosis    Paroxysmal atrial fibrillation ([place]) 03/17/2016   noted on device interrogation,  will follow burden   Pectus excavatum    Pulmonary embolism ([place])    a. after adhesiolysis for SBO in 04/2012 => coumadin for 6 mos   Renal cyst    SBO (small bowel obstruction) [facility]) November 2013   s/p lysis of adhesions   Sinoatrial node dysfunction (H[facility])    Skin cancer    "  left ear; face" (02/28/2018)   Ventricular fibrillation ([place]) 05/2012   . in setting of AL STEMI 12/13 => EF 20-25% => d/c on LifeVest (repeat echo planned in 08/2012)   Past Surgical History:  Procedure Laterality Date   CATARACT EXTRACTION W/ INTRAOCULAR LENS  IMPLANT, BILATERAL Bilateral    COLONOSCOPY     CORONARY ANGIOPLASTY WITH STENT PLACEMENT     "I've got 2 or 3 stents" (02/28/2018)   [place]   "CABG X5" (09/13/2012)   CYSTOSCOPY/RETROGRADE/URETEROSCOPY/STONE EXTRACTION WITH BASKET  1990's    ESOPHAGOGASTRODUODENOSCOPY  05/08/2012   Procedure: ESOPHAGOGASTRODUODENOSCOPY (EGD);  Surgeon: Juanita Craver, MD;  Location: [facility] ENDOSCOPY;  Service: Endoscopy;  Laterality: N/A;  Nevin Bloodgood put on for Dr. Collene Mares , Dr. Collene Mares will call if she wants another time   IMPLANTABLE CARDIOVERTER DEFIBRILLATOR IMPLANT N/A 09/13/2012   SJM Ellipse ER implanted by Dr Rayann Heman for secondary prevention   INGUINAL HERNIA REPAIR Right 1996   INTRAVASCULAR PRESSURE WIRE/FFR STUDY N/A 01/20/2017   Procedure: INTRAVASCULAR PRESSURE WIRE/FFR STUDY;  Surgeon: Wellington Hampshire, MD;  Location: [place] CV LAB;  Service: Cardiovascular;  Laterality: N/A;   LAPAROTOMY  04/27/2012   Procedure: EXPLORATORY LAPAROTOMY;  Surgeon: Gwenyth Ober, MD;  Location: [place];  Service: General;  Laterality: N/A;   LEFT HEART CATH AND CORS/GRAFTS ANGIOGRAPHY N/A 01/20/2017   Procedure: LEFT HEART CATH AND CORS/GRAFTS ANGIOGRAPHY;  Surgeon: Wellington Hampshire, MD;  Location: [place] CV LAB;  Service: Cardiovascular;  Laterality: N/A;   LEFT HEART CATHETERIZATION WITH CORONARY ANGIOGRAM N/A 05/10/2012   Procedure: LEFT HEART CATHETERIZATION WITH CORONARY ANGIOGRAM;  Surgeon: Burnell Blanks, MD;  Location: [facility] CATH LAB;  Service: Cardiovascular;  Laterality: N/A;   PTCA     percutaneous transluminal coronary intervention and brachy therapy, Bruce R. Olevia Perches, MD. EF 60%   RIGHT HEART CATH N/A 03/01/2018   Procedure: RIGHT HEART CATH;  Surgeon: Jolaine Artist, MD;  Location: [place] CV LAB;  Service: Cardiovascular;  Laterality: N/A;   SKIN CANCER EXCISION     "left ear; face" (02/28/2018)   TRANSURETHRAL RESECTION OF PROSTATE     Social History:  reports that he has quit smoking. His smoking use included cigarettes. He has never used smokeless tobacco. He reports that he does not drink alcohol and does not use drugs.  No Active Allergies  Family History  Problem Relation Age of Onset   Heart disease Father    Other Mother         brain tumor   Colon cancer Brother    Cancer Brother        Liver   Heart disease Brother     Prior to Admission medications   Medication Sig Start Date End Date Taking? Authorizing Provider  acetaminophen (TYLENOL) 500 MG tablet Take 1 tablet (500 mg total) by mouth every 6 (six) hours as needed for mild pain (or Fever >/= 101). 03/15/21   Cherene Altes, MD  apixaban (ELIQUIS) 2.5 MG TABS tablet Take 1 tablet (2.5 mg total) by mouth 2 (two) times daily. 07/25/21   Loletha Grayer, MD  ascorbic acid (VITAMIN C) 500 MG tablet Take 1 tablet (500 mg total) by mouth daily. 07/25/21   Loletha Grayer, MD  Cholecalciferol (D3-1000) 25 MCG (1000 UT) capsule Take 2,000 Units by mouth daily.    [provider]  feeding supplement (ENSURE ENLIVE / ENSURE PLUS) LIQD Take 237 mLs by mouth 3 (three) times daily  between meals. 03/15/21   Cherene Altes, MD  ferrous sulfate 325 (65 FE) MG tablet Take 325 mg by mouth daily with breakfast.    [provider]  finasteride (PROSCAR) 5 MG tablet Take 5 mg by mouth daily. 08/30/19   [provider]  furosemide (LASIX) 20 MG tablet Take 10 mg by mouth daily.    [provider]  Multiple Vitamins-Minerals (ICAPS AREDS 2 PO) Take 1 capsule by mouth 2 (two) times daily.    [provider]  senna-docusate (SENOKOT-S) 8.6-50 MG tablet Take 1 tablet by mouth 2 (two) times daily. 03/15/21   Cherene Altes, MD  zinc sulfate 220 (50 Zn) MG capsule Take 220 mg by mouth daily.    [provider]    Physical Exam: Vitals:   01/25/22 1951 01/25/22 1952 01/25/22 2030 01/25/22 2147  BP:  133/80 118/89 106/62  Pulse:  72  75  Resp:  '20 17 20  '$ Temp:  (!) 97.3 F (36.3 C)    TempSrc:  Oral    SpO2: 100% 98% 97% 99%   General: Patient is a frail elderly gentleman.  Who is laying comfortably in bed.  Not in acute distress.  He looks clinically euvolemic. HEENT: Oral mucosa moist.  Pupils equal round reactive  light accommodation. Neck: Supple with no obvious jugular venous distention. Chest: Clinically diminished bilaterally at the base of the lungs.  Trace crackles present.  No wheezing. Cardiovascular: S1-S2, regular rhythm with occasional extra beats.  Systolic murmur present. Abdomen: Soft nontender bowel sounds present.  Extremities: Significant for trace bilateral lower extremity edema. Skin negative for any new rash  Data Reviewed:    WBC 6.7, hemoglobin 11.9 hematocrit 37.2, platelet count 115.  Glucose 153.  COVID-negative.  Sodium 143, BUN 34 creatinine 1.17.  Troponin ordered and pending Chest x-ray: Shows cardiomegaly with no signs of pulm edema or focal pulmonary consolidation.  There is blunting of left costophrenic angle suggesting minimal effusion.  Assessment and Plan:  #1.  Exertional dyspnea: Secondary to chronic combined CHF with NYHA stage IV.  TTE from prior shows left ventricular ejection of 20-25%.  Patient is on 20 mg Lasix.  Will optimize with 40 mg daily.  For Claritin patient is not on ACE inhibitor or beta-blocker.  Lisinopril was previously held in the past due to excessive fatigue as reported by patient.  Unclear why patient is not on beta-blocker.  Will defer to cardiology.  Patient was initiated on Coreg 3.125 twice daily.  Cardiology will be consulted to advise on further optimization for CHF.  Patient is being followed by palliative care as outpatient.  #2.  History of coronary disease status post CABG.  Diseased native vessels.  Cardiac markers has been ordered and pending.  EKG reviewed.  #3.  Paroxysmal atrial fibrillation: Rate and rhythm controlled.  Patient is on anticoagulation with Eliquis.  Continue with same.  Monitor rate and rhythm on telemetry.  #4.  Status post AICD: Patient reports pacemaker has been firing.  Interrogated in the ED.  Defibrillator reported to be low on battery.  Will require possible change of battery.  Defer to cardiology to address  same.  #5.  Hypertension.  Blood pressure slightly elevated.  #6.  Stage III chronic kidney disease: Stable.  #7.  Protein calorie malnutrition/failure to thrive in an adult: Patient lives by himself.  He is now wants to consider skilled nursing facility placement.  Case management to evaluate and advise.  Ensure protein  diet TID     Advance Care Planning:   Code Status: Prior   Consults: Cardiology  Family Communication: No family available at bedside. Patient reports having an estranged relationship with son  Severity of Illness: The appropriate patient status for this patient is INPATIENT. Inpatient status is judged to be reasonable and necessary in order to provide the required intensity of service to ensure the patient's safety. The patient's presenting symptoms, physical exam findings, and initial radiographic and laboratory data in the context of their chronic comorbidities is felt to place them at high risk for further clinical deterioration. Furthermore, it is not anticipated that the patient will be medically stable for discharge from the hospital within 2 midnights of admission.   * I certify that at the point of admission it is my clinical judgment that the patient will require inpatient hospital care spanning beyond 2 midnights from the point of admission due to high intensity of service, high risk for further deterioration and high frequency of surveillance required.*  Author: Artist Beach, MD 01/25/2022 10:39 PM  For on call review [email or web address].

## 2022-01-25 NOTE — ED Provider Triage Note (Signed)
  Emergency Medicine Provider Triage Evaluation Note  Louis Reid , a 88 y.o.male,  was evaluated in triage.  Pt complains of weakness/dizziness.  Patient initially states that he believes his defibrillator is shocking him.  Reports has shocked him twice so far since this morning.   Review of Systems  Positive: Weakness, dizziness Negative: Denies fever, chest pain, vomiting  Physical Exam   Vitals:   01/25/22 1341  BP: 122/71  Pulse: 83  Resp: 18  Temp: 97.8 F (36.6 C)  SpO2: 98%   Gen:   Awake, no distress   Resp:  Normal effort  MSK:   Moves extremities without difficulty  Other:    Medical Decision Making  Given the patient's initial medical screening exam, the following diagnostic evaluation has been ordered. The patient will be placed in the appropriate treatment space, once one is available, to complete the evaluation and treatment. I have discussed the plan of care with the patient and I have advised the patient that an ED physician or mid-level practitioner will reevaluate their condition after the test results have been received, as the results may give them additional insight into the type of treatment they may need.    Diagnostics: Labs, EKG  Treatments: none immediately   Teodoro Spray, [place] 01/25/22 1621

## 2022-01-25 NOTE — ED Triage Notes (Signed)
Pt reports weakness, dizziness, and "my defibrillator is shocking me."

## 2022-01-25 NOTE — ED Provider Notes (Signed)
[facility] Provider Note    Event Date/Time   First MD Initiated Contact with Patient 01/25/22 1750     (approximate)   History   Weakness   HPI  MONTIE SWIDERSKI is a 88 y.o. male who presents to the ED for evaluation of Weakness   I reviewed multiple ED visits for weakness as well as a medical admission in February for weakness associated with COVID.  History of chronically reduced EF of about 20%, paroxysmal A-fib on Eliquis, CKD 3.  Remote CABG with ICD in place due to ischemic cardiomyopathy.  Patient presents to the ED with his son for evaluation of increasing weakness as well as multiple ICD shocks.  Patient reports being in a rehab facility until about 3 weeks ago, when he checked himself out AMA because he was feeling better.  He reports living by himself and is very independent.  He reports feeling increasing generalized weakness over the past few days, particularly worsening in the past 2 days when he has felt 3 different "vibrations" of his ICD.  Reports an episode yesterday, and 2 here today while sitting in the waiting room before coming back.  Denies any syncopal episodes.  Reports a fall a week ago where he struck his head but has been ambulatory since that time.  Reports no fevers, abdominal pain, emesis, chest pain, shortness of breath.  He does report weakness with exertion though   Physical Exam   Triage Vital Signs: ED Triage Vitals [01/25/22 1341]  Enc Vitals Group     BP 122/71     Pulse Rate 83     Resp 18     Temp 97.8 F (36.6 C)     Temp Source Oral     SpO2 98 %     Weight      Height      Head Circumference      Peak Flow      Pain Score      Pain Loc      Pain Edu?      Excl. in [place]?     Most recent vital signs: Vitals:   01/25/22 2030 01/25/22 2147  BP: 118/89 106/62  Pulse:  75  Resp: 17 20  Temp:    SpO2: 97% 99%    General: Awake, no distress.  Sitting upright in bed, looks well, pleasant and  conversational. CV:  Good peripheral perfusion. RRR Resp:  Normal effort.  Abd:  No distention.  Soft and benign MSK:  No deformity noted.  Neuro:  No focal deficits appreciated. Cranial nerves II through XII intact 5/5 strength and sensation in all 4 extremities Other:     ED Results / Procedures / Treatments   Labs (all labs ordered are listed, but only abnormal results are displayed) Labs Reviewed  BASIC METABOLIC PANEL - Abnormal; Notable for the following components:      Result Value   Glucose, Bld 153 (*)    BUN 34 (*)    All other components within normal limits  CBC - Abnormal; Notable for the following components:   RBC 3.85 (*)    Hemoglobin 11.9 (*)    HCT 37.2 (*)    Platelets 115 (*)    All other components within normal limits  BRAIN NATRIURETIC PEPTIDE - Abnormal; Notable for the following components:   B Natriuretic Peptide 389.1 (*)    All other components within normal limits  SARS CORONAVIRUS 2 BY RT PCR  URINALYSIS, ROUTINE W REFLEX MICROSCOPIC  CBG MONITORING, ED  TROPONIN I (HIGH SENSITIVITY)    EKG Sinus rhythm with a rate of 81 bpm.  Normal axis.  Left bundle morphology.  Nonspecific ST changes laterally without STEMI.  RADIOLOGY CT head interpreted by me without evidence of acute intracranial pathology CXR interpreted by me with cardiomegaly and pulmonary vascular congestion without pleural effusion  Official radiology report(s): CT HEAD WO CONTRAST (5MM)  Result Date: 01/25/2022 CLINICAL DATA:  Dizziness, weakness, fall EXAM: CT HEAD WITHOUT CONTRAST TECHNIQUE: Contiguous axial images were obtained from the base of the skull through the vertex without intravenous contrast. RADIATION DOSE REDUCTION: This exam was performed according to the departmental dose-optimization program which includes automated exposure control, adjustment of the mA and/or kV according to patient size and/or use of iterative reconstruction technique. COMPARISON:   07/18/2021 FINDINGS: Brain: No acute intracranial findings are seen. There are no signs of bleeding within the cranium. Ventricles are nondilated. Cortical sulci are prominent. There is decreased density in periventricular white matter. Vascular: Unremarkable. Skull: Unremarkable. Sinuses/Orbits: Unremarkable. Other: None. IMPRESSION: No acute intracranial findings are seen. Atrophy. Small vessel disease. Electronically Signed   By: Elmer Picker M.D.   On: 01/25/2022 19:33   DG Chest 2 View  Result Date: 01/25/2022 CLINICAL DATA:  Dizziness, weakness, cardiac arrhythmia EXAM: CHEST - 2 VIEW COMPARISON:  07/29/2021 FINDINGS: Transverse diameter of heart is increased. There is previous coronary bypass surgery. Pacemaker/defibrillator battery is seen in left infraclavicular region. There are no signs of pulmonary edema or focal pulmonary consolidation. Small transverse linear density in the right lower lung field may suggest scarring. Low position of the diaphragms suggests COPD. There is minimal blunting of left lateral CP angle. There is no pneumothorax. IMPRESSION: Cardiomegaly. There are no signs of pulmonary edema or focal pulmonary consolidation. Blunting of left lateral CP angle may suggest minimal effusion or pleural thickening. Electronically Signed   By: Elmer Picker M.D.   On: 01/25/2022 19:30    PROCEDURES and INTERVENTIONS:  .1-3 Lead EKG Interpretation  Performed by: Vladimir Crofts, MD Authorized by: Vladimir Crofts, MD     Interpretation: normal     ECG rate:  74   ECG rate assessment: normal     Rhythm: sinus rhythm     Ectopy: none     Conduction: normal     Medications  furosemide (LASIX) injection 40 mg (has no administration in time range)     IMPRESSION / MDM / ASSESSMENT AND PLAN / ED COURSE  I reviewed the triage vital signs and the nursing notes.  Differential diagnosis includes, but is not limited to, ACS, CHF, pneumonia, symptomatic anemia  {Patient  presents with symptoms of an acute illness or injury that is potentially life-threatening.  88 year old male presents to the ED with progressive dyspnea, generalized weakness and a vibration sensation from his ICD.  Has stigmata of symptomatic CHF on room air who would benefit from medical observation admission.  Reassuring vitals on room air.  ICD interrogation with stigmata of a low battery causing his vibrations, no actual discharges or ventricular dysrhythmias noted recently.  CXR with pulm vascular congestion and his BNP is elevated.  Negative for COVID, CBC is essentially normal and metabolic panel similarly is reassuring with mild hyperglycemia without signs of DKA.  Due to his symptomatic nature, generalized weakness and him living alone at home, we will consult with medicine for admission.  Clinical Course as of 01/25/22 2236  Nancy Fetter Jan 25, 2022  2145 I received a phone call from the representative to evaluate the patient's interrogation.  They report that he is getting low battery vibration warnings that he has 3 months time to get his ICD battery replaced.  No ventricular dysrhythmias [DS]  2207 Consult hospitalist who agrees to admit. [DS]    Clinical Course User Index [DS] Vladimir Crofts, MD     FINAL CLINICAL IMPRESSION(S) / ED DIAGNOSES   Final diagnoses:  Generalized weakness  Acute on chronic combined systolic and diastolic congestive heart failure ([place])     Rx / DC Orders   ED Discharge Orders     None        Note:  This document was prepared using Dragon voice recognition software and may include unintentional dictation errors.   Vladimir Crofts, MD 01/25/22 2312

## 2022-01-26 ENCOUNTER — Telehealth: Payer: Self-pay

## 2022-01-26 ENCOUNTER — Encounter: Payer: Self-pay | Admitting: Internal Medicine

## 2022-01-26 ENCOUNTER — Ambulatory Visit (INDEPENDENT_AMBULATORY_CARE_PROVIDER_SITE_OTHER): Payer: Medicare Other

## 2022-01-26 DIAGNOSIS — Z95810 Presence of automatic (implantable) cardiac defibrillator: Secondary | ICD-10-CM

## 2022-01-26 DIAGNOSIS — I5022 Chronic systolic (congestive) heart failure: Secondary | ICD-10-CM

## 2022-01-26 DIAGNOSIS — I255 Ischemic cardiomyopathy: Secondary | ICD-10-CM | POA: Diagnosis not present

## 2022-01-26 DIAGNOSIS — I480 Paroxysmal atrial fibrillation: Secondary | ICD-10-CM | POA: Diagnosis not present

## 2022-01-26 DIAGNOSIS — I5023 Acute on chronic systolic (congestive) heart failure: Secondary | ICD-10-CM | POA: Diagnosis not present

## 2022-01-26 LAB — CUP PACEART REMOTE DEVICE CHECK
Battery Remaining Longevity: 0 mo
Battery Voltage: 2.59 V
Brady Statistic AP VP Percent: 1 %
Brady Statistic AP VS Percent: 12 %
Brady Statistic AS VP Percent: 1 %
Brady Statistic AS VS Percent: 85 %
Brady Statistic RA Percent Paced: 8.8 %
Brady Statistic RV Percent Paced: 1 %
Date Time Interrogation Session: 20230820204623
HighPow Impedance: 51 Ohm
HighPow Impedance: 51 Ohm
Implantable Lead Implant Date: 20140408
Implantable Lead Implant Date: 20140408
Implantable Lead Location: 753859
Implantable Lead Location: 753860
Implantable Pulse Generator Implant Date: 20140408
Lead Channel Impedance Value: 310 Ohm
Lead Channel Impedance Value: 330 Ohm
Lead Channel Pacing Threshold Amplitude: 0.5 V
Lead Channel Pacing Threshold Amplitude: 0.5 V
Lead Channel Pacing Threshold Pulse Width: 0.5 ms
Lead Channel Pacing Threshold Pulse Width: 0.5 ms
Lead Channel Sensing Intrinsic Amplitude: 3.1 mV
Lead Channel Sensing Intrinsic Amplitude: 9.2 mV
Lead Channel Setting Pacing Amplitude: 2 V
Lead Channel Setting Pacing Amplitude: 2 V
Lead Channel Setting Pacing Pulse Width: 0.5 ms
Lead Channel Setting Sensing Sensitivity: 0.5 mV
Pulse Gen Serial Number: 7094248

## 2022-01-26 LAB — CBC
HCT: 35.2 % — ABNORMAL LOW (ref 39.0–52.0)
Hemoglobin: 11.5 g/dL — ABNORMAL LOW (ref 13.0–17.0)
MCH: 31.8 pg (ref 26.0–34.0)
MCHC: 32.7 g/dL (ref 30.0–36.0)
MCV: 97.2 fL (ref 80.0–100.0)
Platelets: 108 10*3/uL — ABNORMAL LOW (ref 150–400)
RBC: 3.62 MIL/uL — ABNORMAL LOW (ref 4.22–5.81)
RDW: 14.2 % (ref 11.5–15.5)
WBC: 5.2 10*3/uL (ref 4.0–10.5)
nRBC: 0 % (ref 0.0–0.2)

## 2022-01-26 LAB — TECHNOLOGIST SMEAR REVIEW: Plt Morphology: NORMAL

## 2022-01-26 LAB — BASIC METABOLIC PANEL
Anion gap: 5 (ref 5–15)
BUN: 33 mg/dL — ABNORMAL HIGH (ref 8–23)
CO2: 27 mmol/L (ref 22–32)
Calcium: 8.8 mg/dL — ABNORMAL LOW (ref 8.9–10.3)
Chloride: 112 mmol/L — ABNORMAL HIGH (ref 98–111)
Creatinine, Ser: 1.14 mg/dL (ref 0.61–1.24)
GFR, Estimated: >60 mL/min (ref >60)
Glucose, Bld: 97 mg/dL (ref 70–99)
Potassium: 3.7 mmol/L (ref 3.5–5.1)
Sodium: 144 mmol/L (ref 135–145)

## 2022-01-26 LAB — TROPONIN I (HIGH SENSITIVITY)
Troponin I (High Sensitivity): 17 ng/L (ref <18)
Troponin I (High Sensitivity): 20 ng/L — ABNORMAL HIGH (ref <18)

## 2022-01-26 MED ORDER — ACETAMINOPHEN 325 MG PO TABS: 650.0000 mg | ORAL_TABLET | ORAL | Status: DC | PRN

## 2022-01-26 MED ORDER — APIXABAN 2.5 MG PO TABS
2.5000 mg | ORAL_TABLET | Freq: Two times a day (BID) | ORAL | Status: DC
  Administered 2022-01-26 – 2022-02-01 (×12): 2.5 mg via ORAL
  Filled 2022-01-26 (×12): qty 1

## 2022-01-26 MED ORDER — FINASTERIDE 5 MG PO TABS
5.0000 mg | ORAL_TABLET | Freq: Every day | ORAL | Status: DC
  Administered 2022-01-26 – 2022-02-01 (×7): 5 mg via ORAL
  Filled 2022-01-26 (×7): qty 1

## 2022-01-26 MED ORDER — SENNOSIDES-DOCUSATE SODIUM 8.6-50 MG PO TABS
1.0000 | ORAL_TABLET | Freq: Two times a day (BID) | ORAL | Status: DC
  Administered 2022-01-26 – 2022-01-31 (×11): 1 via ORAL
  Filled 2022-01-26 (×13): qty 1

## 2022-01-26 MED ORDER — VITAMIN D 25 MCG (1000 UNIT) PO TABS
2000.0000 [IU] | ORAL_TABLET | Freq: Every day | ORAL | Status: DC
  Administered 2022-01-26 – 2022-02-01 (×7): 2000 [IU] via ORAL
  Filled 2022-01-26 (×7): qty 2

## 2022-01-26 MED ORDER — CARVEDILOL 3.125 MG PO TABS: 3.1250 mg | ORAL_TABLET | Freq: Two times a day (BID) | ORAL | Status: DC

## 2022-01-26 MED ORDER — ASCORBIC ACID 500 MG PO TABS
500.0000 mg | ORAL_TABLET | Freq: Every day | ORAL | Status: DC
  Administered 2022-01-26 – 2022-02-01 (×7): 500 mg via ORAL
  Filled 2022-01-26 (×7): qty 1

## 2022-01-26 MED ORDER — SODIUM CHLORIDE 0.9% FLUSH
3.0000 mL | Freq: Two times a day (BID) | INTRAVENOUS | Status: DC
  Administered 2022-01-26 – 2022-02-01 (×13): 3 mL via INTRAVENOUS

## 2022-01-26 MED ORDER — ENSURE ENLIVE PO LIQD
237.0000 mL | Freq: Three times a day (TID) | ORAL | Status: DC
  Administered 2022-01-26 – 2022-02-01 (×13): 237 mL via ORAL

## 2022-01-26 MED ORDER — FERROUS SULFATE 325 (65 FE) MG PO TABS
325.0000 mg | ORAL_TABLET | Freq: Every day | ORAL | Status: DC
  Administered 2022-01-26 – 2022-02-01 (×7): 325 mg via ORAL
  Filled 2022-01-26 (×7): qty 1

## 2022-01-26 MED ORDER — ZINC SULFATE 220 (50 ZN) MG PO CAPS
220.0000 mg | ORAL_CAPSULE | Freq: Every day | ORAL | Status: DC
  Administered 2022-01-26 – 2022-02-01 (×7): 220 mg via ORAL
  Filled 2022-01-26 (×7): qty 1

## 2022-01-26 MED ORDER — FUROSEMIDE 10 MG/ML IJ SOLN
40.0000 mg | Freq: Every day | INTRAMUSCULAR | Status: DC
  Administered 2022-01-26 – 2022-01-27 (×2): 40 mg via INTRAVENOUS
  Filled 2022-01-26 (×2): qty 4

## 2022-01-26 MED ORDER — ONDANSETRON HCL 4 MG/2ML IJ SOLN: 4.0000 mg | Freq: Four times a day (QID) | INTRAMUSCULAR | Status: DC | PRN

## 2022-01-26 MED ORDER — HEPARIN SODIUM (PORCINE) 5000 UNIT/ML IJ SOLN: 5000.0000 [IU] | Freq: Three times a day (TID) | INTRAMUSCULAR | Status: DC

## 2022-01-26 MED ORDER — SIMETHICONE 40 MG/0.6ML PO SUSP
40.0000 mg | Freq: Once | ORAL | Status: DC
Start: 2022-01-26 — End: 2022-02-01
  Filled 2022-01-26: qty 0.6

## 2022-01-26 MED ORDER — ACETAMINOPHEN 500 MG PO TABS: 500.0000 mg | ORAL_TABLET | Freq: Four times a day (QID) | ORAL | Status: DC | PRN

## 2022-01-26 MED ORDER — ADULT MULTIVITAMIN W/MINERALS CH
1.0000 | ORAL_TABLET | Freq: Every day | ORAL | Status: DC
Start: 2022-01-26 — End: 2022-02-01
  Administered 2022-01-26 – 2022-02-01 (×7): 1 via ORAL
  Filled 2022-01-26 (×7): qty 1

## 2022-01-26 MED ORDER — APIXABAN 2.5 MG PO TABS
2.5000 mg | ORAL_TABLET | Freq: Two times a day (BID) | ORAL | Status: DC
  Administered 2022-01-26: 2.5 mg via ORAL
  Filled 2022-01-26: qty 1

## 2022-01-26 MED ORDER — SODIUM CHLORIDE 0.9 % IV SOLN: 250.0000 mL | INTRAVENOUS | Status: DC | PRN

## 2022-01-26 MED ORDER — ROSUVASTATIN CALCIUM 10 MG PO TABS
20.0000 mg | ORAL_TABLET | Freq: Every day | ORAL | Status: DC
  Administered 2022-01-26 – 2022-02-01 (×7): 20 mg via ORAL
  Filled 2022-01-26 (×7): qty 2

## 2022-01-26 MED ORDER — SODIUM CHLORIDE 0.9% FLUSH: 3.0000 mL | INTRAVENOUS | Status: DC | PRN

## 2022-01-26 NOTE — TOC Initial Note (Signed)
Transition of Care (TOC) - Initial/Assessment Note    Patient Details  Name: Louis Reid MRN: [id number] Date of Birth: 05/03/1933  Transition of Care (TOC) CM/SW Contact:     C , LCSW Phone Number: 01/26/2022, 3:23 PM  Clinical Narrative:  CSW met with patient. No supports at bedside. CSW introduced role and explained that discharge planning would be discussed. Patient was at [facility] for 90 days before he had to go back home because their daily cost was around $150 more than what his LTC policy ([employer]) would pay per day. Patient does not feel that being home alone is a good option for him anymore and he is interested in ALF vs SNF placement. PT/OT evals pending to see which is a more appropriate option for him. With patient's permission, made [facility] referral. They are familiar with patient from a referral they received in February. No further concerns. CSW encouraged patient to contact CSW as needed.                Expected Discharge Plan:  (TBD) Barriers to Discharge: Continued Medical Work up   Patient Goals and CMS Choice        Expected Discharge Plan and Services Expected Discharge Plan:  (TBD)     Post Acute Care Choice:  (TBD) Living arrangements for the past 2 months: Single Family Home                                      Prior Living Arrangements/Services Living arrangements for the past 2 months: Single Family Home Lives with:: Self Patient language and need for interpreter reviewed:: Yes Do you feel safe going back to the place where you live?: Yes      Need for Family Participation in Patient Care: Yes (Comment) Care giver support system in place?: Yes (comment)   Criminal Activity/Legal Involvement Pertinent to Current Situation/Hospitalization: No - Comment as needed  Activities of Daily Living Home Assistive Devices/Equipment: Eyeglasses, Cane (specify quad or straight) ADL Screening (condition at time of admission) Patient's  cognitive ability adequate to safely complete daily activities?: Yes Is the patient deaf or have difficulty hearing?: No Does the patient have difficulty seeing, even when wearing glasses/contacts?: No Patient able to express need for assistance with ADLs?: Yes Does the patient have difficulty dressing or bathing?: Yes Independently performs ADLs?: Yes (appropriate for developmental age) Does the patient have difficulty walking or climbing stairs?: Yes Weakness of Legs: None Weakness of Arms/Hands: None  Permission Sought/Granted Permission sought to share information with : Facility Contact Representative, Family Supports Permission granted to share information with : Yes, Verbal Permission Granted  Share Information with NAME: Wallace Nunes  Permission granted to share info w AGENCY: SNF's, ALF's, [facility]  Permission granted to share info w Relationship: Son  Permission granted to share info w Contact Information: [phone]  Emotional Assessment Appearance:: Appears stated age Attitude/Demeanor/Rapport: Engaged, Gracious Affect (typically observed): Accepting, Appropriate, Calm, Pleasant Orientation: : Oriented to Self, Oriented to Place, Oriented to  Time, Oriented to Situation Alcohol / Substance Use: Not Applicable Psych Involvement: No (comment)  Admission diagnosis:  Acute on chronic combined systolic and diastolic congestive heart failure (HCC) [I50.43] Generalized weakness [R53.1] CHF (congestive heart failure), NYHA class IV, acute on chronic, systolic (HCC) [I50.23] Patient Active Problem List   Diagnosis Date Noted   COVID-19 virus infection 07/19/2021   Decubital ulcer   04/04/2021   AKI (acute kidney injury) ([place]) 04/02/2021   Splenic infarct    Hepatic steatosis    Stage 3b chronic kidney disease ([place]) 03/11/2021   Unable to care for self 03/11/2021   Constipation 03/11/2021   Failure to thrive in adult 03/11/2021   Protein-calorie malnutrition, severe  02/22/2021   Generalized weakness    Palliative care encounter    Goals of care, counseling/discussion    Encounter for hospice care discussion    Heart failure ([place]) 02/28/2018   Decreased cardiac ejection fraction 01/10/2018   Nonspecific chest pain    Chronic systolic CHF (congestive heart failure) ([place])    Unstable angina ([place]):  (01/20/2017) a. 3-v CAD w/ patent SVG to diag and patent sequential SVG to OM 2 and OM 3. Patent prox LAD stent. LIMA to LAD and SVG to RCA known occl. stable prox RCA sten  01/19/2017   Paroxysmal atrial fibrillation (HCC) 03/17/2016   Claudication ([place]) 01/22/2014   CKD (chronic kidney disease) stage 2, GFR 60-89 ml/min 12/26/2013   ICD (implantable cardioverter-defibrillator) in place 02/16/2013   Postop check 07/05/2012   STEMI (ST elevation myocardial infarction) ([place]) 05/18/2012   Anemia in chronic kidney disease(285.21) 05/17/2012   Pulmonary embolism ([place]) 05/17/2012   Physical deconditioning 05/17/2012   Anorexia 05/17/2012   Cardiogenic shock ([place]) 05/16/2012   Arterial hypotension 05/16/2012   Ventricular fibrillation arrest 05/10/2012   Cardiac arrest ([place]) 05/10/2012   Acute respiratory failure with hypoxia 2/2 cardiac arrest 05/10/2012   Acute encephalopathy 2/2 cardiac arrest 05/10/2012   Hyperglycemia 05/10/2012   Odynophagia 05/07/2012   Coronary artery disease 07/09/2011   History of coronary artery bypass surgery 07/09/2011   DYSPNEA 06/04/2010   Hyperlipidemia, unspecified 04/17/2009   Essential hypertension 04/17/2009   Asymptomatic old MI (myocardial infarction) 04/17/2009   TOBACCO USE, QUIT 04/17/2009   PCP:  [name], MD Pharmacy:   [id number]: [phone]: [phone] [facility] ([address] [address]Phone: [phone]: [phone]    Social Determinants of Health (SDOH) Interventions    Readmission Risk Interventions     No data to display

## 2022-01-26 NOTE — Consult Note (Signed)
Cardiology Consult    Patient ID: DUSHAUN OKEY MRN: [id number], DOB/AGE: 1932-09-07   Admit date: 01/25/2022 Date of Consult: 01/26/2022  Primary Physician: [name], MD Primary Cardiologist: Kirk Ruths, MD Requesting Provider: Imagene Riches, MD  Patient Profile    Louis Reid is a 88 y.o. male with a history of CAD s/p CABG x 5 in 1996 (LIMA  LAD, VG  D1, VG  OM1  OM2, VG  RCA) w/ subsequent LAD PCI's, chronic HFrEF (EF 20-25% 01/2020), ICM s/p ICD, HTN, HL, PAF (CHA2DS2VASc = 5  eliquis), prior post-op PE, and CKD III who is being seen today for the evaluation of CHF at the request of Dr. Si Raider.  Past Medical History   Past Medical History:  Diagnosis Date   AICD (automatic cardioverter/defibrillator) present 09/13/2012   Anemia    CAD (coronary artery disease)    a. 1996 CABG x 5: LIMA->LAD, VG->D2, VG->OM1->OM2, VG->RCA; b. S/P LAD BMS in setting of atretic LIMA; c. 09/2001 PTCA/BrachyRx to LAD 2/2 ISR; d. 05/2012 VF Arrest PCI: Sev 3v dzs, VG->RCA 100, LIMA atretic, LAD 100 (PCI w/ POBA and thrombectomy);  c. 01/2017 Cath: LM mild dzs, LAD patent stent, D1 small, LCX 100, RCA 70p (FFR 0.88), VG->OM1->OM2 ok, VG->D2 ok. LIMA atretic. VG->RCA 100.   Chronic HFrEF (heart failure with reduced ejection fraction) ([place])    a. Echo 12/13: EF 20-25%; b. Echo 3/14: EF 25%; c. 01/2020 Echo: EF 20-25%, no rwma, nl RV fxn, triv MR.   Clotting disorder ([place])    Depression    "years ago" (02/28/2018)   History of kidney stones    HLD (hyperlipidemia)    Hypertension    "I've never had high blood pressure; on RX for other reasons" (02/28/2018)   Inguinal hernia    "have one now on my left side" (09/13/2012); (02/28/2018)   Ischemic cardiomyopathy    a. Echo 12/13: EF 20-25%; b. Echo 3/14: EF 25%; c. 09/2012 s/p Abbott Ellipse DR [id number] DC AICD (ser # [id number]); d. 01/2020 Echo: EF 20-25%.   Myocardial infarction [facility]) 1996;  05/2012   Osteoporosis    Paroxysmal atrial fibrillation  ([place]) 03/17/2016   a. noted on device interrogation. CHA2DS2VASc = 5-->Eliquis 2.5 bid.   Pectus excavatum    Pulmonary embolism ([place])    a. after adhesiolysis for SBO in 04/2012 => coumadin for 6 mos   Renal cyst    SBO (small bowel obstruction) ([place]) 04/2012   s/p lysis of adhesions   Sinoatrial node dysfunction ([place])    Skin cancer    "left ear; face" (02/28/2018)   Ventricular fibrillation ([place]) 05/2012   . in setting of AL STEMI 12/13 => EF 20-25% => d/c on LifeVest (repeat echo planned in 08/2012)    Past Surgical History:  Procedure Laterality Date   CATARACT EXTRACTION W/ INTRAOCULAR LENS  IMPLANT, BILATERAL Bilateral    COLONOSCOPY     CORONARY ANGIOPLASTY WITH STENT PLACEMENT     "I've got 2 or 3 stents" (02/28/2018)   [place]   "CABG X5" (09/13/2012)   CYSTOSCOPY/RETROGRADE/URETEROSCOPY/STONE EXTRACTION WITH BASKET  1990's   ESOPHAGOGASTRODUODENOSCOPY  05/08/2012   Procedure: ESOPHAGOGASTRODUODENOSCOPY (EGD);  Surgeon: Juanita Craver, MD;  [facility] END[facility];  Service: Endoscopy;  Laterality: N/A;  Nevin Bloodgood put on for Dr. Collene Mares , Dr. Collene Mares will call if she wants another time   IMPLANTABLE CARDIOVERTER DEFIBRILLATOR IMPLANT N/A 09/13/2012   SJM Ellipse ER implanted by Dr  Allred for secondary prevention   INGUINAL HERNIA REPAIR Right 1996   INTRAVASCULAR PRESSURE WIRE/FFR STUDY N/A 01/20/2017   Procedure: INTRAVASCULAR PRESSURE WIRE/FFR STUDY;  Surgeon: Wellington Hampshire, MD;  Location: [facility] CV LAB;  Service: Cardiovascular;  Laterality: N/A;   LAPAROTOMY  04/27/2012   Procedure: EXPLORATORY LAPAROTOMY;  Surgeon: Gwenyth Ober, MD;  Location: [facility];  Service: General;  Laterality: N/A;   LEFT HEART CATH AND CORS/GRAFTS ANGIOGRAPHY N/A 01/20/2017   Procedure: LEFT HEART CATH AND CORS/GRAFTS ANGIOGRAPHY;  Surgeon: Wellington Hampshire, MD;  Location: [facility] LAB;  Service: Cardiovascular;  Laterality: N/A;   LEFT HEART CATHETERIZATION WITH CORONARY  ANGIOGRAM N/A 05/10/2012   Procedure: LEFT HEART CATHETERIZATION WITH CORONARY ANGIOGRAM;  Surgeon: Burnell Blanks, MD;  [facility] C[facility];  Service: Cardiovascular;  Laterality: N/A;   PTCA     percutaneous transluminal coronary intervention and brachy therapy, Bruce R. Olevia Perches, MD. EF 60%   RIGHT HEART CATH N/A 03/01/2018   Procedure: RIGHT HEART CATH;  Surgeon: Jolaine Artist, MD;  Location: [facility] LAB;  Service: Cardiovascular;  Laterality: N/A;   SKIN CANCER EXCISION     "left ear; face" (02/28/2018)   TRANSURETHRAL RESECTION OF PROSTATE       Allergies  No Known Allergies  History of Present Illness    88 y.o. male with a history of CAD s/p CABG x 5 in 1996 (LIMA  LAD, VG  D1, VG  OM1  OM2, VG  RCA), chronic HFrEF (EF 20-25% 01/2020), ICM s/p ICD, HTN, HL, PAF (CHA2DS2VASc = 5  eliquis), prior post-op PE, and CKD III.  As noted, he is s/p CABG x 5 in 1996.  He subsequently required PCI and stenting of the LAD in the setting of recurrent chest pain, ongoing native dzs, and development of atresia of the LIMA graft.  In 09/2001, the LAD was treated w/ PTCA and brachyRx in the setting of ISR.  Unfortunately, he re-occluded his LAD and suffered a VF arrest/anterolateral STEMI in 05/2012 and required PTCA and thrombectomy.  Most recent cath in 01/2017, showed stable anatomy w/ patent LAD stent, 70p RCA (FFR 0.88), and patent VG OM1  OM2, VG  D2, and known occlusion of the VG  RCA and atretic LIMA.  In the setting of severe CAD, Louis Reid has also suffers from chronic HFrEF/ICM.  He underwent AICD placement in 09/2012.  His device has not been able to be followed remotely due to poor cellular reception @ home.  Most recent echo in 01/2020 showed an EF of 20-25%.  Right heart pressures were normal/low on R heart cath in 2019.  GDMT for ICM/HFrEF has been limited in the setting of fatigue, soft BPs, and low-output, thus he has only been taking lasix '10mg'$  daily @ home.    Following  admission for COVID-19 infection in 07/2021, he had persistent weakness and failure to thrive, thus prompting repeat ED evaluation and SNF placement w/ palliative care.  He was @ SNF for ~ 3 months but ultimately was not able to continue to afford to stay there, and thus he has been @ home w/ home health/palliative care for the past 6 or more weeks (pt says 3 wks, but note that he was already home @ time of 12/15/2021 cardiology visit, at which time he was felt to be euvolemic).  While @ SNF, though he was predominantly bed/wheelchair bound, he did walk some and noted improvement in his strength.  He says that he was not receiving physical therapy or participating in any type of exercise program while @ SNF.  Since returning home, he has again noted difficulty in caring for himself.  In addition to easy fatigability and generalized weakness, he has had dyspnea w/ relatively minimal exertion.  He notes compliance w/ his medications but he has been out of eliquis and crestor for a few weeks (waiting on refills).  He was seen in the ED @ [facility] on 8/11 due to generalized weakness in hopes of stabilizing, so that he can remain home.  W/u was reassuring and he was d/c'd home.  On Saturday 8/19, he felt his ICD vibrate in his chest.  This occurred again on 8/20, which in combination with his progressive decline in functional status, prompted him to present to the ED for eval.  Here, ECG showed sinus rhythm @ 81 w/ PACs, prior ant infarct and lateral TWI.  Labs notable for mild BNP elevation @ 389.1, H/H stable @ 11.9/37.2.  CT head w/ acute findings. CXR w/ minimal L pleural effusion vs pleural thickening.  His ICD was interrogated and showed that the device reached ERI on 8/19.  He was also noted to have an episode of nonsustained VF on 7/24, which aborted spontaneously and thus did not require therapy.  Prior to that, he did require ATP for a VT episode in 06/2021.  He was admitted and placed on lasix '40mg'$  IV daily.   Carvedilol was added but we've d/c'd 2/2 h/o low output and intolerance.  Currently, Mr. Mowers denies any symptoms @ rest.  He has not had c/p throughout any of this time period.  Inpatient Medications     apixaban  2.5 mg Oral BID   ascorbic acid  500 mg Oral Daily   cholecalciferol  2,000 Units Oral Daily   feeding supplement  237 mL Oral TID BM   ferrous sulfate  325 mg Oral Q breakfast   finasteride  5 mg Oral Daily   furosemide  40 mg Intravenous Daily   rosuvastatin  20 mg Oral Daily   senna-docusate  1 tablet Oral BID   simethicone  40 mg Oral Once   sodium chloride flush  3 mL Intravenous Q12H   zinc sulfate  220 mg Oral Daily    Family History    Family History  Problem Relation Age of Onset   Heart disease Father    Other Mother        brain tumor   Colon cancer Brother    Cancer Brother        Liver   Heart disease Brother    He indicated that his mother is deceased. He indicated that his father is deceased. He indicated that his maternal grandmother is deceased. He indicated that his maternal grandfather is deceased. He indicated that his paternal grandmother is deceased. He indicated that his paternal grandfather is deceased.   Social History    Social History   Socioeconomic History   Marital status: Widowed    Spouse name: Not on file   Number of children: 1   Years of education: Not on file   Highest education level: Not on file  Occupational History   Occupation: [name]    Employer: RETIRED  Tobacco Use   Smoking status: Former    Types: Cigarettes   Smokeless tobacco: Never   Tobacco comments:    09/14/2011; 02/28/2018  "quit smoking when I was ~ 17; probably  didn't smoke 10 packs in my life"  Vaping Use   Vaping Use: Never used  Substance and Sexual Activity   Alcohol use: Never   Drug use: Never   Sexual activity: Not Currently  Other Topics Concern   Not on file  Social History Narrative   Not on file   Social Determinants  of Health   Financial Resource Strain: Low Risk  (01/29/2021)   Overall Financial Resource Strain (CARDIA)    Difficulty of Paying Living Expenses: Not very hard  Food Insecurity: No Food Insecurity (01/27/2021)   Hunger Vital Sign    Worried About Running Out of Food in the Last Year: Never true    [place] in the Last Year: Never true  Transportation Needs: No Transportation Needs (01/27/2021)   PRAPARE - Hydrologist (Medical): No    Lack of Transportation (Non-Medical): No  Physical Activity: Not on file  Stress: Not on file  Social Connections: Not on file  Intimate Partner Violence: Not on file     Review of Systems    General:  +++ fatigue and generalized weakness.  No chills, fever, night sweats or weight changes.  Cardiovascular:  No chest pain, +++ dyspnea on exertion, no edema, orthopnea, palpitations, paroxysmal nocturnal dyspnea. Dermatological: No rash, lesions/masses Respiratory: No cough, +++ dyspnea Urologic: No hematuria, dysuria Abdominal:   No nausea, vomiting, diarrhea, bright red blood per rectum, melena, or hematemesis Neurologic:  No visual changes, wkns, changes in mental status. All other systems reviewed and are otherwise negative except as noted above.  Physical Exam    Blood pressure 116/75, pulse 71, temperature 98.2 F (36.8 C), temperature source Oral, resp. rate 16, height '5\' 11"'$  (1.803 m), weight 54.3 kg, SpO2 97 %.  General: Pleasant, NAD Psych: Normal affect. Neuro: Alert and oriented X 3. Moves all extremities spontaneously. HEENT: Normal  Neck: Supple without bruits or JVD. Lungs:  Resp regular and unlabored, CTA. Heart: RRR no s3, s4, or murmurs. Abdomen: Soft, non-tender, non-distended, BS + x 4.  Extremities: No clubbing, cyanosis.  Trace R lower leg edema. DP/PT1+, Radials 2+ and equal bilaterally.  Labs    Cardiac Enzymes Recent Labs  Lab 01/25/22 2335 01/26/22 0223  TROPONINIHS 17 20*      BNP    Component Value Date/Time   BNP 389.1 (H) 01/25/2022 1349    ProBNP    Component Value Date/Time   PROBNP 240.0 (H) 07/10/2013 0946    Lab Results  Component Value Date   WBC 5.2 01/26/2022   HGB 11.5 (L) 01/26/2022   HCT 35.2 (L) 01/26/2022   MCV 97.2 01/26/2022   PLT 108 (L) 01/26/2022    Recent Labs  Lab 01/26/22 0513  NA 144  K 3.7  CL 112*  CO2 27  BUN 33*  CREATININE 1.14  CALCIUM 8.8*  GLUCOSE 97   Lab Results  Component Value Date   CHOL 89 (L) 08/24/2016   HDL 37 (L) 08/24/2016   LDLCALC 39 08/24/2016   TRIG 65 08/24/2016    Radiology Studies    CUP PACEART REMOTE DEVICE CHECK  Result Date: 01/26/2022 MOD:  Pt. currently hospitalized Scheduled remote reviewed. Device reached ERI 8/19 Last device check 12/24/2020 Events with successful ATPx1 1/22 and 7/24, EGM's show sustained VT Multiple AMS for PAT and brief atrial lead noise Next remote 91 days. Route to triage for awareness LA  CT HEAD WO CONTRAST (5MM)  Result Date: 01/25/2022  CLINICAL DATA:  Dizziness, weakness, fall EXAM: CT HEAD WITHOUT CONTRAST TECHNIQUE: Contiguous axial images were obtained from the base of the skull through the vertex without intravenous contrast. RADIATION DOSE REDUCTION: This exam was performed according to the departmental dose-optimization program which includes automated exposure control, adjustment of the mA and/or kV according to patient size and/or use of iterative reconstruction technique. COMPARISON:  07/18/2021 FINDINGS: Brain: No acute intracranial findings are seen. There are no signs of bleeding within the cranium. Ventricles are nondilated. Cortical sulci are prominent. There is decreased density in periventricular white matter. Vascular: Unremarkable. Skull: Unremarkable. Sinuses/Orbits: Unremarkable. Other: None. IMPRESSION: No acute intracranial findings are seen. Atrophy. Small vessel disease. Electronically Signed   By: Elmer Picker M.D.   On:  01/25/2022 19:33   DG Chest 2 View  Result Date: 01/25/2022 CLINICAL DATA:  Dizziness, weakness, cardiac arrhythmia EXAM: CHEST - 2 VIEW COMPARISON:  07/29/2021 FINDINGS: Transverse diameter of heart is increased. There is previous coronary bypass surgery. Pacemaker/defibrillator battery is seen in left infraclavicular region. There are no signs of pulmonary edema or focal pulmonary consolidation. Small transverse linear density in the right lower lung field may suggest scarring. Low position of the diaphragms suggests COPD. There is minimal blunting of left lateral CP angle. There is no pneumothorax. IMPRESSION: Cardiomegaly. There are no signs of pulmonary edema or focal pulmonary consolidation. Blunting of left lateral CP angle may suggest minimal effusion or pleural thickening. Electronically Signed   By: Elmer Picker M.D.   On: 01/25/2022 19:30    ECG & Cardiac Imaging    Regular sinus rhythm @ 81 w/ PACs, prior ant infarct and lateral TWI - personally reviewed.  Assessment & Plan    1.  Acute on chronic HFrEF/ICM:  EF 20-25% by echo in 01/2020.  S/p ICD in 08/2012, now @ ERI.  Since coming out of SNF earlier in the Summer, he has has had progressive decline in functional status w/ worsening dyspnea prompting ED eval on 8/11 and again 8/20.  He has been followed by palliative care @ home.  He denies chest pain.  Since 8/19, he has noted vibration coming from his ICD and upon interrogation in the ED, the device was noted to have hit ERI on 8/19.  CXR in the ED showed mild pulm edema w/ BNP of 389.1.  He has been placed on IV lasix w/ good response thus far, and stable renal function.  He does not appear to be markedly volume overloaded, though he does have a h/o low output.  Wt recorded as down ~ 9 kg since July office visit.  GDMT limited by low-output (no ? blocker) and soft BP's, and he is only on lasix '10mg'$  daily @ home.  Reasonable to cont IV lasix today, and we can look to transition back  to PO lasix tomorrow.  With elevated BNP, likely needs more than '10mg'$  daily @ home.  As prev noted, overall poor prognosis.  Plan to cont palliative care in the outpt setting.  2.  VT/VF s/p AICD:  ICD vibrating intermittently since 8/19 in the setting of reaching ERI.   Device unable to be followed remotely due to poor cellular reception @ his home.  Review of interrogation shows VT treated w/ ATP in January and more recent episode of self-terminating VF on 7/24.  He is interested in continuing w/ ICD therapy and thus we will ask EP for opinion re: gen change while inpt (potentially on Wed) vs outpt f/u and  elective gen change @ a later date.  3.  CAD:  s/p prior CABG and LAD stenting.  Last cath in 2018 w/ patent LAD stent and stable 3/5 patent grafts.  He has not been having c/p.  + dyspnea as outlined above.  HsTrop minimally elevated @ 20.  Resume home dose of rosuvastatin (ran out @ home recently).  No ASA in setting of eliquis.  No ? blocker in setting of low output CHF.  4.  Essential HTN: stable.  On lasix 10 mg only @ home.  5.  HL:  On rosuvastatin @ home - resume.  6.  PAF:  Noted on device interrogation.  No prolonged or known symptomatic episodes. Maintaining sinus.  Cont eliquis 2.5 bid.  7.  Thrombocytopenia:  Variable and relatively stable over time.  Follow.  Risk Assessment/Risk Scores:        New York Heart Association (NYHA) Functional Class NYHA Class III  CHA2DS2-VASc Score = 5   This indicates a 7.2% annual risk of stroke. The patient's score is based upon: CHF History: 1 HTN History: 1 Diabetes History: 0 Stroke History: 0 Vascular Disease History: 1 Age Score: 2 Gender Score: 0     Signed, Murray Hodgkins, NP 01/26/2022, 4:35 PM  For questions or updates, please contact   Please consult [email or web address] for contact info under Cardiology/STEMI.

## 2022-01-26 NOTE — Progress Notes (Signed)
Electrophysiology Office Note Date: 02/03/2022  ID:  Aycen, Porreca 1933/06/03, MRN [id number]  PCP: [name], MD Primary Cardiologist: Kirk Ruths, MD Electrophysiologist: Vickie Epley, MD   CC: Routine ICD follow-up  Louis Reid is a 88 y.o. male seen today for Vickie Epley, MD for routine electrophysiology followup.  Since last being seen in our clinic the patient reports doing about the same. His primary complaints are mood, anxiety, and trouble sleeping. He sees his PCP tomorrow for this.  He is very firm that he DOES want tachy-therapies and or intubation if it comes to it. He is not interested in DNR at this time. He does not want to "rush" into a gen change though, and wants to think about it further.   Device History: St. Jude Dual Chamber ICD implanted 09/2012 for chronic systolic CHF/ICM History of appropriate therapy: No History of AAD therapy: No  Past Medical History:  Diagnosis Date   AICD (automatic cardioverter/defibrillator) present 09/13/2012   Anemia    CAD (coronary artery disease)    a. 1996 CABG x 5: LIMA->LAD, VG->D2, VG->OM1->OM2, VG->RCA; b. S/P LAD BMS in setting of atretic LIMA; c. 09/2001 PTCA/BrachyRx to LAD 2/2 ISR; d. 05/2012 VF Arrest PCI: Sev 3v dzs, VG->RCA 100, LIMA atretic, LAD 100 (PCI w/ POBA and thrombectomy);  c. 01/2017 Cath: LM mild dzs, LAD patent stent, D1 small, LCX 100, RCA 70p (FFR 0.88), VG->OM1->OM2 ok, VG->D2 ok. LIMA atretic. VG->RCA 100.   Chronic HFrEF (heart failure with reduced ejection fraction) ([place])    a. Echo 12/13: EF 20-25%; b. Echo 3/14: EF 25%; c. 01/2020 Echo: EF 20-25%, no rwma, nl RV fxn, triv MR.   Clotting disorder ([place])    Depression    "years ago" (02/28/2018)   History of kidney stones    HLD (hyperlipidemia)    Hypertension    "I've never had high blood pressure; on RX for other reasons" (02/28/2018)   Inguinal hernia    "have one now on my left side" (09/13/2012); (02/28/2018)    Ischemic cardiomyopathy    a. Echo 12/13: EF 20-25%; b. Echo 3/14: EF 25%; c. 09/2012 s/p Abbott Ellipse DR 4128-78M DC AICD (ser # [id number]); d. 01/2020 Echo: EF 20-25%.   Myocardial infarction [facility]) 1996;  05/2012   Osteoporosis    Paroxysmal atrial fibrillation ([place]) 03/17/2016   a. noted on device interrogation. CHA2DS2VASc = 5-->Eliquis 2.5 bid.   Pectus excavatum    Pulmonary embolism ([place])    a. after adhesiolysis for SBO in 04/2012 => coumadin for 6 mos   Renal cyst    SBO (small bowel obstruction) ([place]) 04/2012   s/p lysis of adhesions   Sinoatrial node dysfunction ([place])    Skin cancer    "left ear; face" (02/28/2018)   Ventricular fibrillation ([place]) 05/2012   . in setting of AL STEMI 12/13 => EF 20-25% => d/c on LifeVest (repeat echo planned in 08/2012)   Past Surgical History:  Procedure Laterality Date   CATARACT EXTRACTION W/ INTRAOCULAR LENS  IMPLANT, BILATERAL Bilateral    COLONOSCOPY     CORONARY ANGIOPLASTY WITH STENT PLACEMENT     "I've got 2 or 3 stents" (02/28/2018)   [place]   "CABG X5" (09/13/2012)   CYSTOSCOPY/RETROGRADE/URETEROSCOPY/STONE EXTRACTION WITH BASKET  1990's   ESOPHAGOGASTRODUODENOSCOPY  05/08/2012   Procedure: ESOPHAGOGASTRODUODENOSCOPY (EGD);  Surgeon: Juanita Craver, MD;  Location: [facility] ENDOSCOPY;  Service: Endoscopy;  Laterality: N/A;  Nevin Bloodgood put on for Dr. Collene Mares , Dr. Collene Mares will call if she wants another time   IMPLANTABLE CARDIOVERTER DEFIBRILLATOR IMPLANT N/A 09/13/2012   SJM Ellipse ER implanted by Dr Rayann Heman for secondary prevention   INGUINAL HERNIA REPAIR Right 1996   INTRAVASCULAR PRESSURE WIRE/FFR STUDY N/A 01/20/2017   Procedure: INTRAVASCULAR PRESSURE WIRE/FFR STUDY;  Surgeon: Wellington Hampshire, MD;  Location: [place] CV LAB;  Service: Cardiovascular;  Laterality: N/A;   LAPAROTOMY  04/27/2012   Procedure: EXPLORATORY LAPAROTOMY;  Surgeon: Gwenyth Ober, MD;  Location: [place];  Service: General;  Laterality: N/A;   LEFT  HEART CATH AND CORS/GRAFTS ANGIOGRAPHY N/A 01/20/2017   Procedure: LEFT HEART CATH AND CORS/GRAFTS ANGIOGRAPHY;  Surgeon: Wellington Hampshire, MD;  Location: [place] CV LAB;  Service: Cardiovascular;  Laterality: N/A;   LEFT HEART CATHETERIZATION WITH CORONARY ANGIOGRAM N/A 05/10/2012   Procedure: LEFT HEART CATHETERIZATION WITH CORONARY ANGIOGRAM;  Surgeon: Burnell Blanks, MD;  Location: [facility] CATH LAB;  Service: Cardiovascular;  Laterality: N/A;   PTCA     percutaneous transluminal coronary intervention and brachy therapy, Bruce R. Olevia Perches, MD. EF 60%   RIGHT HEART CATH N/A 03/01/2018   Procedure: RIGHT HEART CATH;  Surgeon: Jolaine Artist, MD;  Location: [place] CV LAB;  Service: Cardiovascular;  Laterality: N/A;   SKIN CANCER EXCISION     "left ear; face" (02/28/2018)   TRANSURETHRAL RESECTION OF PROSTATE      Current Outpatient Medications  Medication Sig Dispense Refill   acetaminophen (TYLENOL) 500 MG tablet Take 1 tablet (500 mg total) by mouth every 6 (six) hours as needed for mild pain (or Fever >/= 101). 30 tablet 0   ALPRAZolam (XANAX) 0.25 MG tablet Take 1 tablet (0.25 mg total) by mouth 2 (two) times daily as needed for up to 3 days for anxiety or sleep. 6 tablet 0   apixaban (ELIQUIS) 2.5 MG TABS tablet Take 1 tablet (2.5 mg total) by mouth 2 (two) times daily. 60 tablet 0   Cholecalciferol (D3-1000) 25 MCG (1000 UT) capsule Take 2,000 Units by mouth daily.     feeding supplement (ENSURE ENLIVE / ENSURE PLUS) LIQD Take 237 mLs by mouth 3 (three) times daily between meals. 237 mL 12   ferrous sulfate 325 (65 FE) MG tablet Take 325 mg by mouth daily with breakfast.     finasteride (PROSCAR) 5 MG tablet Take 5 mg by mouth daily.     furosemide (LASIX) 20 MG tablet Take 1 tablet (20 mg total) by mouth daily. 30 tablet 0   Multiple Vitamins-Minerals (ICAPS AREDS 2 PO) Take 1 capsule by mouth 2 (two) times daily.     oxyCODONE-acetaminophen (PERCOCET) 5-325 MG tablet Take  1 tablet by mouth every 6 (six) hours as needed for up to 2 days for severe pain or moderate pain. 8 tablet 0   rosuvastatin (CRESTOR) 20 MG tablet Take 20 mg by mouth daily.     senna-docusate (SENOKOT-S) 8.6-50 MG tablet Take 1 tablet by mouth 2 (two) times daily.     zinc sulfate 220 (50 Zn) MG capsule Take 220 mg by mouth daily.     No current facility-administered medications for this visit.    Allergies:   Patient has no known allergies.   Social History: Social History   Socioeconomic History   Marital status: Widowed    Spouse name: Not on file   Number of children: 1   Years of education: Not on file  Highest education level: Not on file  Occupational History   Occupation: [name]    Employer: RETIRED  Tobacco Use   Smoking status: Former    Types: Cigarettes   Smokeless tobacco: Never   Tobacco comments:    09/14/2011; 02/28/2018  "quit smoking when I was ~ 17; probably didn't smoke 10 packs in my life"  Vaping Use   Vaping Use: Never used  Substance and Sexual Activity   Alcohol use: Never   Drug use: Never   Sexual activity: Not Currently  Other Topics Concern   Not on file  Social History Narrative   Not on file   Social Determinants of Health   Financial Resource Strain: Low Risk  (01/29/2021)   Overall Financial Resource Strain (CARDIA)    Difficulty of Paying Living Expenses: Not very hard  Food Insecurity: No Food Insecurity (01/27/2021)   Hunger Vital Sign    Worried About Running Out of Food in the Last Year: Never true    Ran Out of Food in the Last Year: Never true  Transportation Needs: No Transportation Needs (01/27/2021)   PRAPARE - Hydrologist (Medical): No    Lack of Transportation (Non-Medical): No  Physical Activity: Not on file  Stress: Not on file  Social Connections: Not on file  Intimate Partner Violence: Not on file    Family History: Family History  Problem Relation Age of Onset   Heart  disease Father    Other Mother        brain tumor   Colon cancer Brother    Cancer Brother        Liver   Heart disease Brother     Review of Systems: All other systems reviewed and are otherwise negative except as noted above.   Physical Exam: Vitals:   02/03/22 0948  BP: 120/76  Pulse: 87  SpO2: 98%  Weight: 117 lb 6.4 oz (53.3 kg)  Height: '5\' 11"'$  (1.803 m)     GEN- The patient is well appearing, alert and oriented x 3 today.   HEENT: normocephalic, atraumatic; sclera clear, conjunctiva pink; hearing intact; oropharynx clear; neck supple, no JVP Lymph- no cervical lymphadenopathy Lungs- Clear to ausculation bilaterally, normal work of breathing.  No wheezes, rales, rhonchi Heart- Regular rate and rhythm, no murmurs, rubs or gallops, PMI not laterally displaced GI- soft, non-tender, non-distended, bowel sounds present, no hepatosplenomegaly Extremities- no clubbing or cyanosis. No edema; DP/PT/radial pulses 2+ bilaterally MS- no significant deformity or atrophy Skin- warm and dry, no rash or lesion; ICD pocket well healed Psych- euthymic mood, full affect Neuro- strength and sensation are intact  ICD interrogation- reviewed in detail today,  See PACEART report  EKG:  EKG is not ordered today. Personal review of EKG ordered  01/25/2022  shows NSR at 81 bpm  Recent Labs: 07/18/2021: ALT 35; TSH 5.639 01/25/2022: B Natriuretic Peptide 389.1 01/29/2022: Magnesium 2.3 02/01/2022: BUN 46; Creatinine, Ser 1.10; Hemoglobin 11.8; Platelets 116; Potassium 3.9; Sodium 143   Wt Readings from Last 3 Encounters:  02/03/22 117 lb 6.4 oz (53.3 kg)  02/01/22 121 lb 11.1 oz (55.2 kg)  01/16/22 140 lb 3.4 oz (63.6 kg)     Other studies Reviewed: Additional studies/ records that were reviewed today include: Previous EP office notes.   Assessment and Plan:  1.  Chronic systolic dysfunction s/p St. Jude dual chamber ICD  euvolemic today Stable on an appropriate medical  regimen Normal ICD  function for device at [facility] as of 01/24/2022. We will schedule gen change towards the end of his ERI window at his request. See Claudia Desanctis Art report No changes today  2. HTN Stable on current regimen   3. AF Well controlled Continue eliquis.  Current medicines are reviewed at length with the patient today.     Disposition:   Follow up with Dr. Quentin Ore in as usual post gen change    Signed, Shirley Friar, [name]  02/03/2022 10:11 AM  [facility] [place] [phone] (office) [phone] (fax)

## 2022-01-26 NOTE — Progress Notes (Signed)
Initial Nutrition Assessment  DOCUMENTATION CODES:   Underweight, Severe malnutrition in context of chronic illness  INTERVENTION:   -Liberalize diet to regular for widest variety of meal selections -MVI with minerals daily -Ensure Enlive po TID, each supplement provides 350 kcal and 20 grams of protein  NUTRITION DIAGNOSIS:   Severe Malnutrition related to chronic illness (CHF) as evidenced by severe fat depletion, severe muscle depletion, percent weight loss.  GOAL:   Patient will meet greater than or equal to 90% of their needs  MONITOR:   PO intake, Supplement acceptance  REASON FOR ASSESSMENT:   Consult Assessment of nutrition requirement/status  ASSESSMENT:   Pt with medical history significant for history of coronary disease status post CABG in 1996, STEMI in 2013, ischemic cardiomyopathy, end-stage combined diastolic and systolic CHF with last documented LVEF of 20 to 25%, status post AICD in 2014, hypertension PAF on anti-coagulation and hyperlipidemia.  Pt admitted with CHF and FTT.   Reviewed I/O's: -1.4 L x 24 hours  UOP: 1.4 L x 24 hours   Spoke with pt at bedside, who reports feeling better today. Pt shares that he reports that his appetite has improved and his taste and smell have improved. He reports he consumed about 50% of his lunch and breakfast. Noted meal completions 50-100%.   PTA, pt reports eating 2-3 meals per day. It has been more difficult for him to perform ADLs ("even walking to the mailbox is a chore"). Per pt, he has experienced a general decline in health since being discharge from SNF a few months ago.   Per pt, he lost down to 100#, but has recent gained some weight back. Noted pt has experienced a 14.6% wt loss over the past month, which is significant for time frame.   Discussed importance of good meal and supplement intake to promote healing. Pt amenable to Ensure.   Medications reviewed and include vitamin C, vitamin D3, ferrous  sulfate, lasix, and zinc sulfate.   Lab Results  Component Value Date   HGBA1C 5.9 (H) 05/18/2012   PTA DM medications are none.   Labs reviewed: Phos: 1.5, CBGS: 147 (inpatient orders for glycemic control are none).    NUTRITION - FOCUSED PHYSICAL EXAM:  Flowsheet Row Most Recent Value  Orbital Region Severe depletion  Upper Arm Region Severe depletion  Thoracic and Lumbar Region Severe depletion  Buccal Region Severe depletion  Temple Region Severe depletion  Clavicle Bone Region Severe depletion  Clavicle and Acromion Bone Region Severe depletion  Scapular Bone Region Severe depletion  Dorsal Hand Severe depletion  Patellar Region Severe depletion  Anterior Thigh Region Severe depletion  Posterior Calf Region Severe depletion  Edema (RD Assessment) None  Hair Reviewed  Eyes Reviewed  Mouth Reviewed  Skin Reviewed  Nails Reviewed       Diet Order:   Diet Order             Diet regular Room service appropriate? Yes; Fluid consistency: Thin  Diet effective now                   EDUCATION NEEDS:   Education needs have been addressed  Skin:  Skin Assessment: Reviewed RN Assessment  Last BM:  01/25/22  Height:   Ht Readings from Last 1 Encounters:  01/26/22 '5\' 11"'$  (1.803 m)    Weight:   Wt Readings from Last 1 Encounters:  01/26/22 54.3 kg    Ideal Body Weight:  78.2 kg  BMI:  Body  mass index is 16.7 kg/m.  Estimated Nutritional Needs:   Kcal:  1700-1900  Protein:  90-105 grams  Fluid:  > 1.7 L    Loistine Chance, RD, LDN, [place] Registered Dietitian II Certified Diabetes Care and Education Specialist Please refer to [facility] for RD and/or RD on-call/weekend/after hours pager

## 2022-01-26 NOTE — Progress Notes (Signed)
       CROSS COVER NOTE  NAME: Louis Reid MRN: [id number] DOB : 12/15/1932    Date of Service   01/26/22  HPI/Events of Note   Medication request received for patient report of "gas pain".  Interventions   Plan: Simethicone x1     This document was prepared using Dragon voice recognition software and may include unintentional dictation errors.  [name] DNP, MHA, FNP-BC Nurse Practitioner [facility] Pager [phone]

## 2022-01-26 NOTE — Consult Note (Signed)
ANTICOAGULATION CONSULT NOTE - Initial Consult  Pharmacy Consult for Eliquis Indication: atrial fibrillation  No Active Allergies  Patient Measurements: Height: '5\' 11"'$  (180.3 cm) Weight: 54.3 kg (119 lb 11.4 oz) IBW/kg (Calculated) : 75.3  Vital Signs: Temp: 98.2 F (36.8 C) (08/21 1341) Temp Source: Oral (08/21 1341) BP: 116/75 (08/21 1345) Pulse Rate: 71 (08/21 1345)  Labs: Recent Labs    01/25/22 1349 01/25/22 2335 01/26/22 0223 01/26/22 0513  HGB 11.9*  --   --  11.5*  HCT 37.2*  --   --  35.2*  PLT 115*  --   --  108*  CREATININE 1.17  --   --  1.14  TROPONINIHS  --  17 20*  --     Estimated Creatinine Clearance: 35.1 mL/min (by C-G formula based on SCr of 1.14 mg/dL).   Medical History: Past Medical History:  Diagnosis Date   AICD (automatic cardioverter/defibrillator) present 09/13/2012   Anemia    CAD (coronary artery disease)    a. 1996 CABG x 5: LIMA->LAD, VG->D2, VG->OM1->OM2, VG->RCA; b. S/P LAD BMS in setting of atretic LIMA; c. 09/2001 PTCA/BrachyRx to LAD 2/2 ISR; d. 05/2012 VF Arrest PCI: Sev 3v dzs, VG->RCA 100, LIMA atretic, LAD 100 (PCI w/ POBA and thrombectomy);  c. 01/2017 Cath: LM mild dzs, LAD patent stent, D1 small, LCX 100, RCA 70p (FFR 0.88), VG->OM1->OM2 ok, VG->D2 ok. LIMA atretic. VG->RCA 100.   Chronic HFrEF (heart failure with reduced ejection fraction) ([place])    a. Echo 12/13: EF 20-25%; b. Echo 3/14: EF 25%; c. 01/2020 Echo: EF 20-25%, no rwma, nl RV fxn, triv MR.   Clotting disorder ([place])    Depression    "years ago" (02/28/2018)   History of kidney stones    HLD (hyperlipidemia)    Hypertension    "I've never had high blood pressure; on RX for other reasons" (02/28/2018)   Inguinal hernia    "have one now on my left side" (09/13/2012); (02/28/2018)   Ischemic cardiomyopathy    a. Echo 12/13: EF 20-25%; b. Echo 3/14: EF 25%; c. 09/2012 s/p Abbott Ellipse DR 4097-35H DC AICD (ser # [id number]); d. 01/2020 Echo: EF 20-25%.   Myocardial  infarction [facility]) 1996;  05/2012   Osteoporosis    Paroxysmal atrial fibrillation ([place]) 03/17/2016   noted on device interrogation,  will follow burden   Pectus excavatum    Pulmonary embolism ([place])    a. after adhesiolysis for SBO in 04/2012 => coumadin for 6 mos   Renal cyst    SBO (small bowel obstruction) ([place]) 04/2012   s/p lysis of adhesions   Sinoatrial node dysfunction ([place])    Skin cancer    "left ear; face" (02/28/2018)   Ventricular fibrillation ([place]) 05/2012   . in setting of AL STEMI 12/13 => EF 20-25% => d/c on LifeVest (repeat echo planned in 08/2012)    Assessment: coronary disease status post CABG in 1996, STEMI in 2013, ischemic cardiomyopathy, end-stage combined diastolic and systolic CHF with last documented LVEF of 20 to 25%, status post AICD in 2014, hypertension PAF on anti-coagulation and hyperlipidemia. Pharmacy consulted to resume Eliqius.  Plan:  Resume Eliquis at 2.'5mg'$  BID (age > 65, Wt < 60kg) Monitor s/sx of bleeding and morning labs.  Beckett Maden Rodriguez-Guzman PharmD, BCPS 01/26/2022 2:20 PM

## 2022-01-26 NOTE — Telephone Encounter (Signed)
Patient is currently admitted in the hospital and [facility] transmitter connected.  ICD has reached ERI 01/24/22. Will need in-clinic apt to discuss.

## 2022-01-26 NOTE — Progress Notes (Addendum)
PROGRESS NOTE    Louis Reid  SFK:[id number] DOB: 23-Jul-1932 DOA: 01/25/2022 PCP: [name], MD  Outpatient Specialists: cardiology    Brief Narrative:   From admission h and p Louis Reid is a 88 y.o. male with medical history significant for history of coronary disease status post CABG in 1996, STEMI in 2013, ischemic cardiomyopathy, end-stage combined diastolic and systolic CHF with last documented LVEF of 20 to 25%, status post AICD in 2014, hypertension PAF on anti-coagulation and hyperlipidemia.  At baseline, patient lives by himself and is followed by the palliative care team at home.  He has had progressive worsening exertional dyspnea over the past several weeks.  Last seen in the ED on 01/16/2022 on account of shortness of breath.  Patient was seen evaluated and discharged home.  He returns to the emergency room today, barely, 9 days after on account of worsening shortness of breath.  At this visit however, patient complains of his defibrillator going off.  This he describes as making him feel miserable.  He lives by himself and feels he is unable to take care of himself now.  He is requesting to be possibly placed in a skilled nursing facility.  He was admitted in February 2023 on account of COVID-19 infection.  Lisinopril was apparently discontinued on account of fatigue.  He was also seen in cardiology clinic on 12/15/2021.   In the ED today, device was interrogated and as reported in ED, no reported arrhythmias.  AICD battery however was noted to be low(3 months remaining).  He however denies any chest pain on this admission.  Denies any worsening lower extremity edema.   Assessment & Plan:   Principal Problem:   CHF (congestive heart failure), NYHA class IV, acute on chronic, systolic (HCC) Active Problems:   Paroxysmal atrial fibrillation (HCC)   Essential hypertension   Coronary artery disease   History of coronary artery bypass surgery   Pulmonary  embolism (HCC)   ICD (implantable cardioverter-defibrillator) in place   Stage 3b chronic kidney disease ([place])  # Failure to thrive Main reason for coming to ED is severe dyspnea, inability to care for self at home - PT/OT consults - [facility] consulted  # Combined systolic/diastolic CHF No overt exacerbation, breathing comfortably on room air. Most recent EF 20-25. Has ICD that appears to be running out of battery. Cardiology consulted - defer mgmt to them  # Hx CABG # CAD # Has ICD No chest pain, troponins only mildly elevated and flat - monitor - ICD battery mgmt per cardiology - cont home statin  # Thrombocytopenia Mild - f/u hiv, hcv, smear - trend  # A-fib # Hx PE HR wnl - cont home apixaban  # BPH - cont home finasteride  # Malnutrition - RD consult  DVT prophylaxis: home apixaban Code Status: DNR Family Communication: none at bedside, no answer when son called today  Level of care: Telemetry Medical Status is: Inpatient Remains inpatient appropriate because: unsafe d/c plan    Consultants:  cardiology  Procedures: none  Antimicrobials:  none    Subjective: Feels fatigued, no specific complaints  Objective: Vitals:   01/26/22 0222 01/26/22 0434 01/26/22 0440 01/26/22 0926  BP: 100/62 137/79  110/66  Pulse: 70 (!) 109  65  Resp: '16 19  18  '$ Temp:  97.7 F (36.5 C)  98.2 F (36.8 C)  TempSrc:  Oral  Oral  SpO2: 99% 100%  100%  Weight:   54.3 kg  Height:   '5\' 11"'$  (1.803 m)     Intake/Output Summary (Last 24 hours) at 01/26/2022 1307 Last data filed at 01/26/2022 1153 Gross per 24 hour  Intake 480 ml  Output 2550 ml  Net -2070 ml   Filed Weights   01/26/22 0440  Weight: 54.3 kg    Examination:  General exam: Appears calm and comfortable, malnourished Respiratory system: rales at bases otherwise clear Cardiovascular system: distant heart sounds Gastrointestinal system: Abdomen is nondistended, soft and nontender. No organomegaly or  masses felt. Normal bowel sounds heard. Central nervous system: Alert and oriented. No focal neurological deficits. Extremities: Symmetric 5 x 5 power. Trace LE edema Skin: No rashes, lesions or ulcers Psychiatry: Judgement and insight appear normal. Mood & affect appropriate.     Data Reviewed: I have personally reviewed following labs and imaging studies  CBC: Recent Labs  Lab 01/25/22 1349 01/26/22 0513  WBC 6.7 5.2  HGB 11.9* 11.5*  HCT 37.2* 35.2*  MCV 96.6 97.2  PLT 115* 784*   Basic Metabolic Panel: Recent Labs  Lab 01/25/22 1349 01/26/22 0513  NA 143 144  K 4.1 3.7  CL 111 112*  CO2 26 27  GLUCOSE 153* 97  BUN 34* 33*  CREATININE 1.17 1.14  CALCIUM 9.4 8.8*   GFR: Estimated Creatinine Clearance: 35.1 mL/min (by C-G formula based on SCr of 1.14 mg/dL). Liver Function Tests: No results for input(s): "AST", "ALT", "ALKPHOS", "BILITOT", "PROT", "ALBUMIN" in the last 168 hours. No results for input(s): "LIPASE", "AMYLASE" in the last 168 hours. No results for input(s): "AMMONIA" in the last 168 hours. Coagulation Profile: No results for input(s): "INR", "PROTIME" in the last 168 hours. Cardiac Enzymes: No results for input(s): "CKTOTAL", "CKMB", "CKMBINDEX", "TROPONINI" in the last 168 hours. BNP (last 3 results) No results for input(s): "PROBNP" in the last 8760 hours. HbA1C: No results for input(s): "HGBA1C" in the last 72 hours. CBG: No results for input(s): "GLUCAP" in the last 168 hours. Lipid Profile: No results for input(s): "CHOL", "HDL", "LDLCALC", "TRIG", "CHOLHDL", "LDLDIRECT" in the last 72 hours. Thyroid Function Tests: No results for input(s): "TSH", "T4TOTAL", "FREET4", "T3FREE", "THYROIDAB" in the last 72 hours. Anemia Panel: No results for input(s): "VITAMINB12", "FOLATE", "FERRITIN", "TIBC", "IRON", "RETICCTPCT" in the last 72 hours. Urine analysis:    Component Value Date/Time   COLORURINE YELLOW (A) 01/25/2022 1945   APPEARANCEUR  CLEAR (A) 01/25/2022 1945   LABSPEC 1.020 01/25/2022 1945   PHURINE 5.0 01/25/2022 1945   GLUCOSEU NEGATIVE 01/25/2022 1945   HGBUR NEGATIVE 01/25/2022 1945   BILIRUBINUR NEGATIVE 01/25/2022 1945   KETONESUR NEGATIVE 01/25/2022 1945   PROTEINUR NEGATIVE 01/25/2022 1945   UROBILINOGEN 1.0 05/24/2012 1330   NITRITE NEGATIVE 01/25/2022 1945   LEUKOCYTESUR NEGATIVE 01/25/2022 1945   Sepsis Labs: '@LABRCNTIP'$ (procalcitonin:4,lacticidven:4)  ) Recent Results (from the past 240 hour(s))  SARS Coronavirus 2 by RT PCR (hospital order, performed in [place] hospital lab) *cepheid single result test* Anterior Nasal Swab     Status: None   Collection Time: 01/25/22  7:45 PM   Specimen: Anterior Nasal Swab  Result Value Ref Range Status   SARS Coronavirus 2 by RT PCR NEGATIVE NEGATIVE Final    Comment: (NOTE) SARS-CoV-2 target nucleic acids are NOT DETECTED.  The SARS-CoV-2 RNA is generally detectable in upper and lower respiratory specimens during the acute phase of infection. The lowest concentration of SARS-CoV-2 viral copies this assay can detect is 250 copies / mL. A negative result does not preclude  SARS-CoV-2 infection and should not be used as the sole basis for treatment or other patient management decisions.  A negative result may occur with improper specimen collection / handling, submission of specimen other than nasopharyngeal swab, presence of viral mutation(s) within the areas targeted by this assay, and inadequate number of viral copies (<250 copies / mL). A negative result must be combined with clinical observations, patient history, and epidemiological information.  Fact Sheet for Patients:   https://www.patel.info/  Fact Sheet for Healthcare Providers: https://hall.com/  This test is not yet approved or  cleared by the [place] FDA and has been authorized for detection and/or diagnosis of SARS-CoV-2 by FDA under an  Emergency Use Authorization (EUA).  This EUA will remain in effect (meaning this test can be used) for the duration of the COVID-19 declaration under Section 564(b)(1) of the Act, 21 U.S.C. section 360bbb-3(b)(1), unless the authorization is terminated or revoked sooner.  Performed at [facility], [address] [place], [place] [address]          Radiology Studies: CT HEAD WO CONTRAST (5MM)  Result Date: 01/25/2022 CLINICAL DATA:  Dizziness, weakness, fall EXAM: CT HEAD WITHOUT CONTRAST TECHNIQUE: Contiguous axial images were obtained from the base of the skull through the vertex without intravenous contrast. RADIATION DOSE REDUCTION: This exam was performed according to the departmental dose-optimization program which includes automated exposure control, adjustment of the mA and/or kV according to patient size and/or use of iterative reconstruction technique. COMPARISON:  07/18/2021 FINDINGS: Brain: No acute intracranial findings are seen. There are no signs of bleeding within the cranium. Ventricles are nondilated. Cortical sulci are prominent. There is decreased density in periventricular white matter. Vascular: Unremarkable. Skull: Unremarkable. Sinuses/Orbits: Unremarkable. Other: None. IMPRESSION: No acute intracranial findings are seen. Atrophy. Small vessel disease. Electronically Signed   By: Elmer Picker M.D.   On: 01/25/2022 19:33   DG Chest 2 View  Result Date: 01/25/2022 CLINICAL DATA:  Dizziness, weakness, cardiac arrhythmia EXAM: CHEST - 2 VIEW COMPARISON:  07/29/2021 FINDINGS: Transverse diameter of heart is increased. There is previous coronary bypass surgery. Pacemaker/defibrillator battery is seen in left infraclavicular region. There are no signs of pulmonary edema or focal pulmonary consolidation. Small transverse linear density in the right lower lung field may suggest scarring. Low position of the diaphragms suggests COPD. There is minimal blunting of  left lateral CP angle. There is no pneumothorax. IMPRESSION: Cardiomegaly. There are no signs of pulmonary edema or focal pulmonary consolidation. Blunting of left lateral CP angle may suggest minimal effusion or pleural thickening. Electronically Signed   By: Elmer Picker M.D.   On: 01/25/2022 19:30        Scheduled Meds:  ascorbic acid  500 mg Oral Daily   cholecalciferol  2,000 Units Oral Daily   feeding supplement  237 mL Oral TID BM   ferrous sulfate  325 mg Oral Q breakfast   finasteride  5 mg Oral Daily   furosemide  40 mg Intravenous Daily   heparin  5,000 Units Subcutaneous Q8H   senna-docusate  1 tablet Oral BID   simethicone  40 mg Oral Once   sodium chloride flush  3 mL Intravenous Q12H   zinc sulfate  220 mg Oral Daily   Continuous Infusions:  sodium chloride       LOS: 1 day     Desma Maxim, MD [facility]   If 7PM-7AM, please contact night-coverage [email or web address] Password [facility] 01/26/2022, 1:07 PM

## 2022-01-27 DIAGNOSIS — I5023 Acute on chronic systolic (congestive) heart failure: Secondary | ICD-10-CM | POA: Diagnosis not present

## 2022-01-27 DIAGNOSIS — R627 Adult failure to thrive: Secondary | ICD-10-CM | POA: Diagnosis not present

## 2022-01-27 LAB — CBC
HCT: 37.4 % — ABNORMAL LOW (ref 39.0–52.0)
Hemoglobin: 12.1 g/dL — ABNORMAL LOW (ref 13.0–17.0)
MCH: 30.6 pg (ref 26.0–34.0)
MCHC: 32.4 g/dL (ref 30.0–36.0)
MCV: 94.7 fL (ref 80.0–100.0)
Platelets: 117 10*3/uL — ABNORMAL LOW (ref 150–400)
RBC: 3.95 MIL/uL — ABNORMAL LOW (ref 4.22–5.81)
RDW: 14 % (ref 11.5–15.5)
WBC: 6.1 10*3/uL (ref 4.0–10.5)
nRBC: 0 % (ref 0.0–0.2)

## 2022-01-27 LAB — BASIC METABOLIC PANEL
Anion gap: 6 (ref 5–15)
BUN: 35 mg/dL — ABNORMAL HIGH (ref 8–23)
CO2: 26 mmol/L (ref 22–32)
Calcium: 9 mg/dL (ref 8.9–10.3)
Chloride: 111 mmol/L (ref 98–111)
Creatinine, Ser: 1.14 mg/dL (ref 0.61–1.24)
GFR, Estimated: >60 mL/min (ref >60)
Glucose, Bld: 108 mg/dL — ABNORMAL HIGH (ref 70–99)
Potassium: 3.8 mmol/L (ref 3.5–5.1)
Sodium: 143 mmol/L (ref 135–145)

## 2022-01-27 LAB — HIV ANTIBODY (ROUTINE TESTING W REFLEX): HIV Screen 4th Generation wRfx: NONREACTIVE

## 2022-01-27 MED ORDER — MELATONIN 5 MG PO TABS
5.0000 mg | ORAL_TABLET | Freq: Every day | ORAL | Status: DC
Start: 2022-01-27 — End: 2022-02-01
  Administered 2022-01-27 – 2022-01-31 (×5): 5 mg via ORAL
  Filled 2022-01-27 (×5): qty 1

## 2022-01-27 MED ORDER — FUROSEMIDE 20 MG PO TABS
20.0000 mg | ORAL_TABLET | Freq: Every day | ORAL | Status: DC
  Administered 2022-01-28 – 2022-02-01 (×5): 20 mg via ORAL
  Filled 2022-01-27 (×5): qty 1

## 2022-01-27 NOTE — Progress Notes (Signed)
Cardiology Progress Note   Patient Name: Louis Reid Date of Encounter: 01/27/2022  Primary Cardiologist: Kirk Ruths, MD  Subjective   No specific complaints this AM.  Breathing stable @ rest.  No c/p.  Inpatient Medications    Scheduled Meds:  apixaban  2.5 mg Oral BID   ascorbic acid  500 mg Oral Daily   cholecalciferol  2,000 Units Oral Daily   feeding supplement  237 mL Oral TID BM   ferrous sulfate  325 mg Oral Q breakfast   finasteride  5 mg Oral Daily   [START ON 01/28/2022] furosemide  20 mg Oral Daily   multivitamin with minerals  1 tablet Oral Daily   rosuvastatin  20 mg Oral Daily   senna-docusate  1 tablet Oral BID   simethicone  40 mg Oral Once   sodium chloride flush  3 mL Intravenous Q12H   zinc sulfate  220 mg Oral Daily   Continuous Infusions:  sodium chloride     PRN Meds: sodium chloride, acetaminophen, ondansetron (ZOFRAN) IV, sodium chloride flush   Vital Signs    Vitals:   01/26/22 2336 01/27/22 0450 01/27/22 0800 01/27/22 1114  BP: 93/66 120/79 108/67 102/77  Pulse: (!) 51 (!) 51 72 85  Resp: '14 14 18 18  '$ Temp: 98.1 F (36.7 C) 98.1 F (36.7 C) 98 F (36.7 C) 98.4 F (36.9 C)  TempSrc: Oral Oral    SpO2: 97% 97% 98% 98%  Weight:  52.6 kg    Height:        Intake/Output Summary (Last 24 hours) at 01/27/2022 1141 Last data filed at 01/27/2022 1127 Gross per 24 hour  Intake 480 ml  Output 2000 ml  Net -1520 ml   Filed Weights   01/26/22 0440 01/27/22 0450  Weight: 54.3 kg 52.6 kg    Physical Exam   GEN: Thin, frail, in no acute distress.  HEENT: Grossly normal.  Neck: Supple, no JVD, carotid bruits, or masses. Cardiac: RRR, no murmurs, rubs, or gallops. No clubbing, cyanosis, edema.  Radials 2+, DP/PT 2+ and equal bilaterally.  Respiratory:  Respirations regular and unlabored, clear to auscultation bilaterally. GI: Soft, nontender, nondistended, BS + x 4. MS: no deformity or atrophy. Skin: warm and dry, no  rash. Neuro:  Strength and sensation are intact. Psych: AAOx3.  Normal affect.  Labs    Chemistry Recent Labs  Lab 01/25/22 1349 01/26/22 0513 01/27/22 0605  NA 143 144 143  K 4.1 3.7 3.8  CL 111 112* 111  CO2 '26 27 26  '$ GLUCOSE 153* 97 108*  BUN 34* 33* 35*  CREATININE 1.17 1.14 1.14  CALCIUM 9.4 8.8* 9.0  GFRNONAA >60 >60 >60  ANIONGAP '6 5 6     '$ Hematology Recent Labs  Lab 01/25/22 1349 01/26/22 0513 01/27/22 0605  WBC 6.7 5.2 6.1  RBC 3.85* 3.62* 3.95*  HGB 11.9* 11.5* 12.1*  HCT 37.2* 35.2* 37.4*  MCV 96.6 97.2 94.7  MCH 30.9 31.8 30.6  MCHC 32.0 32.7 32.4  RDW 14.2 14.2 14.0  PLT 115* 108* 117*    Cardiac Enzymes  Recent Labs  Lab 01/25/22 2335 01/26/22 0223  TROPONINIHS 17 20*      BNP    Component Value Date/Time   BNP 389.1 (H) 01/25/2022 1349    ProBNP    Component Value Date/Time   PROBNP 240.0 (H) 07/10/2013 0946   Lipids  Lab Results  Component Value Date   CHOL 89 (L) 08/24/2016  HDL 37 (L) 08/24/2016   LDLCALC 39 08/24/2016   TRIG 65 08/24/2016   CHOLHDL 2.4 08/24/2016    HbA1c  Lab Results  Component Value Date   HGBA1C 5.9 (H) 05/18/2012    Radiology    CT HEAD WO CONTRAST (5MM)  Result Date: 01/25/2022 CLINICAL DATA:  Dizziness, weakness, fall EXAM: CT HEAD WITHOUT CONTRAST TECHNIQUE: Contiguous axial images were obtained from the base of the skull through the vertex without intravenous contrast. RADIATION DOSE REDUCTION: This exam was performed according to the departmental dose-optimization program which includes automated exposure control, adjustment of the mA and/or kV according to patient size and/or use of iterative reconstruction technique. COMPARISON:  07/18/2021 FINDINGS: Brain: No acute intracranial findings are seen. There are no signs of bleeding within the cranium. Ventricles are nondilated. Cortical sulci are prominent. There is decreased density in periventricular white matter. Vascular: Unremarkable.  Skull: Unremarkable. Sinuses/Orbits: Unremarkable. Other: None. IMPRESSION: No acute intracranial findings are seen. Atrophy. Small vessel disease. Electronically Signed   By: Elmer Picker M.D.   On: 01/25/2022 19:33   DG Chest 2 View  Result Date: 01/25/2022 CLINICAL DATA:  Dizziness, weakness, cardiac arrhythmia EXAM: CHEST - 2 VIEW COMPARISON:  07/29/2021 FINDINGS: Transverse diameter of heart is increased. There is previous coronary bypass surgery. Pacemaker/defibrillator battery is seen in left infraclavicular region. There are no signs of pulmonary edema or focal pulmonary consolidation. Small transverse linear density in the right lower lung field may suggest scarring. Low position of the diaphragms suggests COPD. There is minimal blunting of left lateral CP angle. There is no pneumothorax. IMPRESSION: Cardiomegaly. There are no signs of pulmonary edema or focal pulmonary consolidation. Blunting of left lateral CP angle may suggest minimal effusion or pleural thickening. Electronically Signed   By: Elmer Picker M.D.   On: 01/25/2022 19:30    Telemetry    Predominantly A paced, V sensed, PVCs, up to 10 beats NSVT - Personally Reviewed  Cardiac Studies   2D Echocardiogram 8.2021  1. Left ventricular ejection fraction, by estimation, is 20 to 25%. The  left ventricle has severely decreased function. The left ventricle has no  regional wall motion abnormalities. The left ventricular internal cavity  size was mildly dilated. Left  ventricular diastolic parameters are indeterminate.   2. Right ventricular systolic function is normal. The right ventricular  size is normal. There is normal pulmonary artery systolic pressure.   3. The mitral valve is normal in structure. Trivial mitral valve  regurgitation. No evidence of mitral stenosis.   4. The aortic valve is normal in structure. Aortic valve regurgitation is  not visualized. No aortic stenosis is present.   5. The inferior  vena cava is dilated in size with <50% respiratory  variability, suggesting right atrial pressure of 15 mmHg.  _____________   Patient Profile     Louis Reid is a 88 y.o. male with a history of CAD s/p CABG x 5 in 1996 (LIMA  LAD, VG  D1, VG  OM1  OM2, VG  RCA) w/ subsequent LAD PCI's, chronic HFrEF (EF 20-25% 01/2020), ICM s/p ICD, HTN, HL, PAF (CHA2DS2VASc = 5  eliquis), prior post-op PE, and CKD III, who was admitted 8/20 w/ progressive decline in activity tolerance, weakness, dyspnea, and report of his ICD vibrating (reached ERI on 8/19).  Assessment & Plan    1.  Acute on chronic HFrEF/ICM:  EF 20-25% by echo in 01/2020.  S/p ICD in 08/2012, now @ ERI.  Since  coming out of SNF earlier in the Summer, he has has had progressive decline in functional status w/ worsening dyspnea prompting ED eval on 8/11 and again 8/20.  He has been followed by palliative care @ home.  He denies chest pain.  Since 8/19, he has noted vibration coming from his ICD and upon interrogation in the ED, the device was noted to have hit ERI on 8/19.  CXR in the ED showed mild pulm edema w/ BNP of 389.1.  He has responded well to IV lasix and is minus 2.4 L since admission.  His wt, which has dropped significantly since July, is down further this AM to 52.6 kg (was 63.6 kg on 12/15/2021 @ office visit).  Appears euvolemic on exam.  We will d/c lasix and transition to PO for tomorrow.  He was on lasix 10 mg daily prior to admission.  In setting of volume overload on admission, will increase PO dose to 20 mg daily.  No ? blocker in setting of prior intolerance/low output.  No acei/arb/arni/mra 2/2 prior intolerance and chronically soft BPs.  2.  VT/VF s/p AICD:  ICD vibrating intermittently starting 8/19.  Interrogation shows that the device reached ERI that day.  Review of interrogation shows VT treated w/ ATP in January and more recent episode of self-terminating VF on 7/24.  He is interested in continuing w/ ICD therapy.  We'll  see if EP team can shut off vibratory alert tomorrow morning and consider gen change.  Currently scheduled for outpt EP f/u in [place] 8/31.  3.  CAD:  s/p prior CABG and LAD stenting.  Last cath in 2018 w/ patent LAD stent and stable 3/5 patent grafts.  He has not been having c/p.  + dyspnea as outlined above.  HsTrop minimally elevated @ 20.  NO c/p.  No asa in setting of eliquis.  Cont statin.  4.  HL:  cont statin rx.  5.  PAF:  noted on device interrogation.  NO prlonged or known symptomatic episodes.  In sinus.  Cont eliquis 2.5 mg BID.  6.  Thrombocytopenia: stable.  7.  Cachexia/Deconditioning:  ~ 10 kg wt loss since July.  Pt has been living by himself since coming out of SNF earlier this year due to cost.  He admits to difficulty in preparing meals and poor appetite.  It does not appear that he will be able to live independently going forward.  Soc Work seeing for [employer].  Signed, Murray Hodgkins, NP  01/27/2022, 11:41 AM    For questions or updates, please contact   Please consult [email or web address] for contact info under Cardiology/STEMI.

## 2022-01-27 NOTE — Progress Notes (Signed)
PROGRESS NOTE    Louis Reid  XTG:[id number] DOB: June 06, 1933 DOA: 01/25/2022 PCP: [name], MD  Outpatient Specialists: cardiology    Brief Narrative:   From admission h and p Louis Reid is a 88 y.o. male with medical history significant for history of coronary disease status post CABG in 1996, STEMI in 2013, ischemic cardiomyopathy, end-stage combined diastolic and systolic CHF with last documented LVEF of 20 to 25%, status post AICD in 2014, hypertension PAF on anti-coagulation and hyperlipidemia.  At baseline, patient lives by himself and is followed by the palliative care team at home.  He has had progressive worsening exertional dyspnea over the past several weeks.  Last seen in the ED on 01/16/2022 on account of shortness of breath.  Patient was seen evaluated and discharged home.  He returns to the emergency room today, barely, 9 days after on account of worsening shortness of breath.  At this visit however, patient complains of his defibrillator going off.  This he describes as making him feel miserable.  He lives by himself and feels he is unable to take care of himself now.  He is requesting to be possibly placed in a skilled nursing facility.  He was admitted in February 2023 on account of COVID-19 infection.  Lisinopril was apparently discontinued on account of fatigue.  He was also seen in cardiology clinic on 12/15/2021.   In the ED today, device was interrogated and as reported in ED, no reported arrhythmias.  AICD battery however was noted to be low(3 months remaining).  He however denies any chest pain on this admission. Denies any worsening lower extremity edema.   Assessment & Plan:   Principal Problem:   Failure to thrive in adult Active Problems:   Paroxysmal atrial fibrillation (HCC)   Essential hypertension   Coronary artery disease   History of coronary artery bypass surgery   Pulmonary embolism (HCC)   ICD (implantable  cardioverter-defibrillator) in place   CKD (chronic kidney disease) stage 2, GFR 60-89 ml/min   Stage 3b chronic kidney disease ([place])  # Failure to thrive Main reason for coming to ED is severe dyspnea, inability to care for self at home - PT/OT consulted, doesn't qualify for SNF, needs long-term care, TOC is involved.   # Combined systolic/diastolic CHF No overt exacerbation, breathing comfortably on room air. Most recent EF 20-25. Has ICD that appears to be running out of battery. Cardiology consulted - lasix increased to 20 - EP to eval patient tomorrow  # Hx CABG # CAD # Has ICD No chest pain, troponins only mildly elevated and flat - monitor - cont home statin  # Thrombocytopenia Mild. Smear unremarkable. stable - f/u hiv, hcv  # A-fib # Hx PE HR wnl - cont home apixaban  # BPH - cont home finasteride  # Malnutrition - RD consulted  DVT prophylaxis: home apixaban Code Status: DNR Family Communication: son updated telephonically 8/22  Level of care: Telemetry Medical Status is: Inpatient Remains inpatient appropriate because: unsafe d/c plan    Consultants:  cardiology  Procedures: none  Antimicrobials:  none    Subjective: Feels fatigued, no specific complaints  Objective: Vitals:   01/26/22 2336 01/27/22 0450 01/27/22 0800 01/27/22 1114  BP: 93/66 120/79 108/67 102/77  Pulse: (!) 51 (!) 51 72 85  Resp: '14 14 18 18  '$ Temp: 98.1 F (36.7 C) 98.1 F (36.7 C) 98 F (36.7 C) 98.4 F (36.9 C)  TempSrc: Oral Oral  SpO2: 97% 97% 98% 98%  Weight:  52.6 kg    Height:        Intake/Output Summary (Last 24 hours) at 01/27/2022 1613 Last data filed at 01/27/2022 1408 Gross per 24 hour  Intake 360 ml  Output 1350 ml  Net -990 ml   Filed Weights   01/26/22 0440 01/27/22 0450  Weight: 54.3 kg 52.6 kg    Examination:  General exam: Appears calm and comfortable, malnourished Respiratory system: rales at bases otherwise clear Cardiovascular  system: distant heart sounds Gastrointestinal system: Abdomen is nondistended, soft and nontender. No organomegaly or masses felt. Normal bowel sounds heard. Central nervous system: Alert and oriented. No focal neurological deficits. Extremities: Symmetric 5 x 5 power. Trace LE edema Skin: No rashes, lesions or ulcers Psychiatry: Judgement and insight appear normal. Mood & affect appropriate.     Data Reviewed: I have personally reviewed following labs and imaging studies  CBC: Recent Labs  Lab 01/25/22 1349 01/26/22 0513 01/27/22 0605  WBC 6.7 5.2 6.1  HGB 11.9* 11.5* 12.1*  HCT 37.2* 35.2* 37.4*  MCV 96.6 97.2 94.7  PLT 115* 108* 419*   Basic Metabolic Panel: Recent Labs  Lab 01/25/22 1349 01/26/22 0513 01/27/22 0605  NA 143 144 143  K 4.1 3.7 3.8  CL 111 112* 111  CO2 '26 27 26  '$ GLUCOSE 153* 97 108*  BUN 34* 33* 35*  CREATININE 1.17 1.14 1.14  CALCIUM 9.4 8.8* 9.0   GFR: Estimated Creatinine Clearance: 34 mL/min (by C-G formula based on SCr of 1.14 mg/dL). Liver Function Tests: No results for input(s): "AST", "ALT", "ALKPHOS", "BILITOT", "PROT", "ALBUMIN" in the last 168 hours. No results for input(s): "LIPASE", "AMYLASE" in the last 168 hours. No results for input(s): "AMMONIA" in the last 168 hours. Coagulation Profile: No results for input(s): "INR", "PROTIME" in the last 168 hours. Cardiac Enzymes: No results for input(s): "CKTOTAL", "CKMB", "CKMBINDEX", "TROPONINI" in the last 168 hours. BNP (last 3 results) No results for input(s): "PROBNP" in the last 8760 hours. HbA1C: No results for input(s): "HGBA1C" in the last 72 hours. CBG: No results for input(s): "GLUCAP" in the last 168 hours. Lipid Profile: No results for input(s): "CHOL", "HDL", "LDLCALC", "TRIG", "CHOLHDL", "LDLDIRECT" in the last 72 hours. Thyroid Function Tests: No results for input(s): "TSH", "T4TOTAL", "FREET4", "T3FREE", "THYROIDAB" in the last 72 hours. Anemia Panel: No results for  input(s): "VITAMINB12", "FOLATE", "FERRITIN", "TIBC", "IRON", "RETICCTPCT" in the last 72 hours. Urine analysis:    Component Value Date/Time   COLORURINE YELLOW (A) 01/25/2022 1945   APPEARANCEUR CLEAR (A) 01/25/2022 1945   LABSPEC 1.020 01/25/2022 1945   PHURINE 5.0 01/25/2022 1945   GLUCOSEU NEGATIVE 01/25/2022 1945   HGBUR NEGATIVE 01/25/2022 1945   BILIRUBINUR NEGATIVE 01/25/2022 1945   KETONESUR NEGATIVE 01/25/2022 1945   PROTEINUR NEGATIVE 01/25/2022 1945   UROBILINOGEN 1.0 05/24/2012 1330   NITRITE NEGATIVE 01/25/2022 1945   LEUKOCYTESUR NEGATIVE 01/25/2022 1945   Sepsis Labs: '@LABRCNTIP'$ (procalcitonin:4,lacticidven:4)  ) Recent Results (from the past 240 hour(s))  SARS Coronavirus 2 by RT PCR (hospital order, performed in [place] hospital lab) *cepheid single result test* Anterior Nasal Swab     Status: None   Collection Time: 01/25/22  7:45 PM   Specimen: Anterior Nasal Swab  Result Value Ref Range Status   SARS Coronavirus 2 by RT PCR NEGATIVE NEGATIVE Final    Comment: (NOTE) SARS-CoV-2 target nucleic acids are NOT DETECTED.  The SARS-CoV-2 RNA is generally detectable in upper and  lower respiratory specimens during the acute phase of infection. The lowest concentration of SARS-CoV-2 viral copies this assay can detect is 250 copies / mL. A negative result does not preclude SARS-CoV-2 infection and should not be used as the sole basis for treatment or other patient management decisions.  A negative result may occur with improper specimen collection / handling, submission of specimen other than nasopharyngeal swab, presence of viral mutation(s) within the areas targeted by this assay, and inadequate number of viral copies (<250 copies / mL). A negative result must be combined with clinical observations, patient history, and epidemiological information.  Fact Sheet for Patients:   https://www.patel.info/  Fact Sheet for Healthcare  Providers: https://hall.com/  This test is not yet approved or  cleared by the [place] FDA and has been authorized for detection and/or diagnosis of SARS-CoV-2 by FDA under an Emergency Use Authorization (EUA).  This EUA will remain in effect (meaning this test can be used) for the duration of the COVID-19 declaration under Section 564(b)(1) of the Act, 21 U.S.C. section 360bbb-3(b)(1), unless the authorization is terminated or revoked sooner.  Performed at [facility], [address] [place], [place] [address]          Radiology Studies: CUP PACEART REMOTE DEVICE CHECK  Result Date: 01/26/2022 MOD:  Pt. currently hospitalized Scheduled remote reviewed. Device reached ERI 8/19 Last device check 12/24/2020 Events with successful ATPx1 1/22 and 7/24, EGM's show sustained VT Multiple AMS for PAT and brief atrial lead noise Next remote 91 days. Route to triage for awareness LA  CT HEAD WO CONTRAST (5MM)  Result Date: 01/25/2022 CLINICAL DATA:  Dizziness, weakness, fall EXAM: CT HEAD WITHOUT CONTRAST TECHNIQUE: Contiguous axial images were obtained from the base of the skull through the vertex without intravenous contrast. RADIATION DOSE REDUCTION: This exam was performed according to the departmental dose-optimization program which includes automated exposure control, adjustment of the mA and/or kV according to patient size and/or use of iterative reconstruction technique. COMPARISON:  07/18/2021 FINDINGS: Brain: No acute intracranial findings are seen. There are no signs of bleeding within the cranium. Ventricles are nondilated. Cortical sulci are prominent. There is decreased density in periventricular white matter. Vascular: Unremarkable. Skull: Unremarkable. Sinuses/Orbits: Unremarkable. Other: None. IMPRESSION: No acute intracranial findings are seen. Atrophy. Small vessel disease. Electronically Signed   By: Elmer Picker M.D.   On:  01/25/2022 19:33   DG Chest 2 View  Result Date: 01/25/2022 CLINICAL DATA:  Dizziness, weakness, cardiac arrhythmia EXAM: CHEST - 2 VIEW COMPARISON:  07/29/2021 FINDINGS: Transverse diameter of heart is increased. There is previous coronary bypass surgery. Pacemaker/defibrillator battery is seen in left infraclavicular region. There are no signs of pulmonary edema or focal pulmonary consolidation. Small transverse linear density in the right lower lung field may suggest scarring. Low position of the diaphragms suggests COPD. There is minimal blunting of left lateral CP angle. There is no pneumothorax. IMPRESSION: Cardiomegaly. There are no signs of pulmonary edema or focal pulmonary consolidation. Blunting of left lateral CP angle may suggest minimal effusion or pleural thickening. Electronically Signed   By: Elmer Picker M.D.   On: 01/25/2022 19:30        Scheduled Meds:  apixaban  2.5 mg Oral BID   ascorbic acid  500 mg Oral Daily   cholecalciferol  2,000 Units Oral Daily   feeding supplement  237 mL Oral TID BM   ferrous sulfate  325 mg Oral Q breakfast   finasteride  5 mg  Oral Daily   [START ON 01/28/2022] furosemide  20 mg Oral Daily   multivitamin with minerals  1 tablet Oral Daily   rosuvastatin  20 mg Oral Daily   senna-docusate  1 tablet Oral BID   simethicone  40 mg Oral Once   sodium chloride flush  3 mL Intravenous Q12H   zinc sulfate  220 mg Oral Daily   Continuous Infusions:  sodium chloride       LOS: 2 days     Desma Maxim, MD [facility]   If 7PM-7AM, please contact night-coverage [email or web address] Password [facility] 01/27/2022, 4:13 PM

## 2022-01-27 NOTE — Progress Notes (Signed)
[facility]) Hospital Liaison Note   Received request from Transitions of Care Manager, Judson Roch, for hospice services at [facility] after discharge. Chart and patient information under review by [facility] physician. Hospice eligibility approved.   Spoke with patient to initiate education related to hospice philosophy, services, and team approach to care. Patient verbalized understanding of information given. Per discussion, the plan is for patient to discharge home via [facility] once cleared to DC.    Please send signed and completed DNR home with patient/family. Please provide prescriptions at discharge as needed to ensure ongoing symptom management.    [facility] information and contact numbers given to family & above information shared with TOC.   Please call with any questions/concerns.    Thank you for the opportunity to participate in this patient's care.   Daphene Calamity, MSW [facility] Liaison  [phone]

## 2022-01-27 NOTE — Evaluation (Signed)
Occupational Therapy Evaluation Patient Details Name: Louis Reid MRN: [id number] DOB: October 01, 1932 Today's Date: 01/27/2022   History of Present Illness The patient is an 88 yo male that presented to the ED for weakness and inability to care for himself. PMH of recent hospital admissions, coronary disease, heart failure ejection fraction 20%, paroxysmal atrial fibrillation, chronic kidney disease stage IIIb, BPH, hypertension, dyslipidemia, PE, was on hospice but transitioned to palliative, MI, AICD, Covid 2/23.   Clinical Impression   Pt seen for OT evaluation this date. Pt received in recliner, agreeable to session. Pt endorses feeling as though he is unable to safely care for himself at home any more. He has been requiring increased time/effort to complete daily ADL tasks, primarily completes sink baths now as to avoid he shower/falls, and performs like meal prep. He reports having been told he shouldn't drive because his ICD vibrated but admits to driving himself 1-2 miles down the road to get groceries when needed. He reports his son lives out of town but he could call a neighbor to assist. When asked who, he declines to specify. Pt also reports that he has been unable to refill 2/4 prescriptions at his pharmacy due to his MD not getting back to the pharmacy to approve it. Pt completes ADL transfers with supv and completes LB dressing with supervision, technique improved when educated in figure 4 to minimize bending. [employer] representative in room at end of session. Recommend pt consider a higher level of care moving forward with additional follow up therapies to maximize safety, independence, and minimize caregiver burden.      Recommendations for follow up therapy are one component of a multi-disciplinary discharge planning process, led by the attending physician.  Recommendations may be updated based on patient status, additional functional criteria and insurance authorization.   Follow  Up Recommendations  Other (comment) (Recommendation at this time is a transition to higher levels of care (LTC, ALF, PCA for in home) with follow up therapies to improve function as able.)    Assistance Recommended at Discharge Intermittent Supervision/Assistance  Patient can return home with the following A little help with walking and/or transfers;A little help with bathing/dressing/bathroom;Assistance with cooking/housework;Assist for transportation;Help with stairs or ramp for entrance    Functional Status Assessment  Patient has had a recent decline in their functional status and demonstrates the ability to make significant improvements in function in a reasonable and predictable amount of time.  Equipment Recommendations  None recommended by OT    Recommendations for Other Services       Precautions / Restrictions Precautions Precautions: Fall Restrictions Weight Bearing Restrictions: No      Mobility Bed Mobility               General bed mobility comments: NT, up in recliner    Transfers Overall transfer level: Needs assistance Equipment used: Rolling walker (2 wheels) Transfers: Sit to/from Stand Sit to Stand: Supervision                  Balance Overall balance assessment: Needs assistance Sitting-balance support: Feet supported Sitting balance-Leahy Scale: Good       Standing balance-Leahy Scale: Fair                             ADL either performed or assessed with clinical judgement   ADL  General ADL Comments: Pt able to doff, donn socks while in seated position with improved performance noted with cue to use figure 4 technique, supv for ADL transfers with RW, anticipate PRN assist for seated bathing for safety.     Vision         Perception     Praxis      Pertinent Vitals/Pain Pain Assessment Pain Assessment: No/denies pain     Hand Dominance      Extremity/Trunk Assessment Upper Extremity Assessment Upper Extremity Assessment: Generalized weakness   Lower Extremity Assessment Lower Extremity Assessment: Defer to PT evaluation       Communication Communication Communication: No difficulties   Cognition Arousal/Alertness: Awake/alert Behavior During Therapy: WFL for tasks assessed/performed Overall Cognitive Status: Within Functional Limits for tasks assessed                                       General Comments       Exercises     Shoulder Instructions      Home Living Family/patient expects to be discharged to:: Private residence Living Arrangements: Alone Available Help at Discharge: Available PRN/intermittently;Family;Other (Comment) (neighbors) Type of Home: House Home Access: Stairs to enter [employer] of Steps: 5 Entrance Stairs-Rails: Right;Left;Can reach both Home Layout: Able to live on main level with bedroom/bathroom;Two level     Bathroom Shower/Tub: Teacher, early years/pre: Standard     Home Equipment: Grab bars - toilet;Cane - single point;Rolling Walker (2 wheels)          Prior Functioning/Environment Prior Level of Function : Needs assist             Mobility Comments: At baseline, indep with SPC for ADLs, household mobilization; does endorse recent functional decline that has been going on for months. recently at a facility ADLs Comments: Pt reports indep with basic ADL at baseline, light meal prep (sandwiches), typically gets groceries for himself (1-2 miles down the road, he drives)        OT Problem List: Decreased strength;Decreased activity tolerance;Impaired balance (sitting and/or standing);Decreased knowledge of use of DME or AE      OT Treatment/Interventions: Self-care/ADL training;Therapeutic exercise;Therapeutic activities;Energy conservation;Patient/family education;Balance training;DME and/or AE instruction    OT  Goals(Current goals can be found in the care plan section) Acute Rehab OT Goals Patient Stated Goal: go somewhere but not home by myself OT Goal Formulation: With patient Time For Goal Achievement: 02/10/22 Potential to Achieve Goals: Good ADL Goals Pt Will Perform Lower Body Dressing: with modified independence;sit to/from stand Pt Will Transfer to Toilet: with modified independence;ambulating (LRAD) Additional ADL Goal #1: Pt will complete all aspects of bathing, primarily from seated position with PRN MIN A  OT Frequency: Min 2X/week    Co-evaluation              AM-PAC OT "6 Clicks" Daily Activity     Outcome Measure Help from another person eating meals?: None Help from another person taking care of personal grooming?: None Help from another person toileting, which includes using toliet, bedpan, or urinal?: A Little Help from another person bathing (including washing, rinsing, drying)?: A Little Help from another person to put on and taking off regular upper body clothing?: None Help from another person to put on and taking off regular lower body clothing?: A Little 6 Click Score: 21   End of Session  Activity Tolerance:   Patient left: in chair;with call bell/phone within reach;with chair alarm set;Other (comment) [facility])  OT Visit Diagnosis: Other abnormalities of gait and mobility (R26.89);Muscle weakness (generalized) (M62.81);History of falling (Z91.81)                Time: 2595-6387 OT Time Calculation (min): 25 min Charges:  OT General Charges $OT Visit: 1 Visit OT Evaluation $OT Eval Low Complexity: 1 Low OT Treatments $Self Care/Home Management : 8-22 mins  Ardeth Perfect., MPH, MS, OTR/L ascom [phone] 01/27/22, 2:50 PM

## 2022-01-27 NOTE — Evaluation (Addendum)
Physical Therapy Evaluation Patient Details Name: Louis Reid MRN: [id number] DOB: 06/10/1932 Today's Date: 01/27/2022  History of Present Illness  The patient is an 88 yo male that presented to the ED for weakness and inability to care for himself. PMH of recent hospital admissions, coronary disease, heart failure ejection fraction 20%, paroxysmal atrial fibrillation, chronic kidney disease stage IIIb, BPH, hypertension, dyslipidemia, PE, was on hospice but transitioned to palliative, MI, AICD, Covid 2/23.   Clinical Impression  Pt alert, oriented x4, denied pain. Reported at baseline he lives alone but recently discharge from a facility and has been having difficulty caring for himself. Reports fatigue, but ambulatory with SPC and modI/I for ADLs except for a few days prior to admission.   The patient demonstrated bed mobility modI, and transferred with RW with supervision. He ambulated ~74f with CGA and RW, reported some fatigue with this activity but did report the RW helped. PT and pt did discuss discharge options in detail.  Overall the patient demonstrated deficits (see "PT Problem List") that impede the patient's functional abilities, safety, and mobility and would benefit from skilled PT intervention. Recommendation at this time is a transition to higher levels of care (LTC, ALF, PCA for in home) with follow up therapies to improve function as able.           Recommendations for follow up therapy are one component of a multi-disciplinary discharge planning process, led by the attending physician.  Recommendations may be updated based on patient status, additional functional criteria and insurance authorization.  Follow Up Recommendations Other (comment) (Recommendation at this time is a transition to higher levels of care (LTC, ALF, PCA for in home) with follow up therapies to improve function as able.)      Assistance Recommended at Discharge Intermittent Supervision/Assistance   Patient can return home with the following  Assistance with cooking/housework;Assist for transportation;Help with stairs or ramp for entrance;Direct supervision/assist for medications management    Equipment Recommendations Wheelchair cushion (measurements PT);Wheelchair (measurements PT)  Recommendations for Other Services       Functional Status Assessment Patient has had a recent decline in their functional status and/or demonstrates limited ability to make significant improvements in function in a reasonable and predictable amount of time     Precautions / Restrictions Precautions Precautions: Fall Restrictions Weight Bearing Restrictions: No      Mobility  Bed Mobility Overal bed mobility: Modified Independent                  Transfers Overall transfer level: Needs assistance Equipment used: Rolling walker (2 wheels) Transfers: Sit to/from Stand Sit to Stand: Supervision                Ambulation/Gait Ambulation/Gait assistance: Min guard Gait Distance (Feet): 30 Feet Assistive device: Rolling walker (2 wheels)         General Gait Details: no LOB, did need cueing for upright posture  Stairs            Wheelchair Mobility    Modified Rankin (Stroke Patients Only)       Balance Overall balance assessment: Needs assistance Sitting-balance support: Feet supported Sitting balance-Leahy Scale: Good       Standing balance-Leahy Scale: Fair Standing balance comment: improved steadiness and safety with RW                             Pertinent Vitals/Pain Pain Assessment Pain Assessment: No/denies  pain    Home Living Family/patient expects to be discharged to:: Private residence Living Arrangements: Alone Available Help at Discharge: Available PRN/intermittently;Family;Other (Comment) (neighbors) Type of Home: House Home Access: Stairs to enter Entrance Stairs-Rails: Right;Left;Can reach both Entrance Stairs-Number of  Steps: 5   Home Layout: Able to live on main level with bedroom/bathroom;Two level Home Equipment: Grab bars - toilet;Cane - single point;Rolling Walker (2 wheels)      Prior Function Prior Level of Function : Needs assist             Mobility Comments: At baseline, indep with SPC for ADLs, household mobilization; does endorse recent functional decline that has been going on for months. recently at a facility ADLs Comments: Pt reports indep with basic ADL at baseline     Hand Dominance        Extremity/Trunk Assessment   Upper Extremity Assessment Upper Extremity Assessment: Generalized weakness    Lower Extremity Assessment Lower Extremity Assessment:  (grossly 4-/5 bilaterally)       Communication   Communication: No difficulties  Cognition Arousal/Alertness: Awake/alert Behavior During Therapy: WFL for tasks assessed/performed Overall Cognitive Status: Within Functional Limits for tasks assessed                                          General Comments      Exercises     Assessment/Plan    PT Assessment Patient needs continued PT services  PT Problem List Decreased strength;Decreased mobility;Decreased activity tolerance;Decreased balance;Decreased knowledge of use of DME       PT Treatment Interventions DME instruction;Therapeutic exercise;Gait training;Balance training;Stair training;Neuromuscular re-education;Functional mobility training;Therapeutic activities;Patient/family education    PT Goals (Current goals can be found in the Care Plan section)  Acute Rehab PT Goals Patient Stated Goal: to get the support/help that he needs at discharge PT Goal Formulation: With patient Time For Goal Achievement: 02/10/22 Potential to Achieve Goals: Fair    Frequency Min 2X/week     Co-evaluation               AM-PAC PT "6 Clicks" Mobility  Outcome Measure Help needed turning from your back to your side while in a flat bed without  using bedrails?: None Help needed moving from lying on your back to sitting on the side of a flat bed without using bedrails?: None Help needed moving to and from a bed to a chair (including a wheelchair)?: None Help needed standing up from a chair using your arms (e.g., wheelchair or bedside chair)?: None Help needed to walk in hospital room?: A Little Help needed climbing 3-5 steps with a railing? : A Little 6 Click Score: 22    End of Session Equipment Utilized During Treatment: Gait belt Activity Tolerance: Patient tolerated treatment well Patient left: in chair;with call bell/phone within reach;with chair alarm set Nurse Communication: Mobility status PT Visit Diagnosis: Other abnormalities of gait and mobility (R26.89);Difficulty in walking, not elsewhere classified (R26.2);Muscle weakness (generalized) (M62.81)    Time: 3875-6433 PT Time Calculation (min) (ACUTE ONLY): 26 min   Charges:   PT Evaluation $PT Eval Low Complexity: 1 Low PT Treatments $Therapeutic Activity: 23-37 mins       Lieutenant Diego PT, DPT 1:36 PM,01/27/22

## 2022-01-27 NOTE — TOC Progression Note (Signed)
Transition of Care [facility]) - Progression Note    Patient Details  Name: Louis Reid MRN: [id number] Date of Birth: [birth year]  Transition of Care [facility]) CM/SW [place], LCSW Phone Number: 01/27/2022, 11:19 AM  Clinical Narrative:  [employer] representative is here to talk with patient. She spoke with son yesterday and they are planning to start touring LTC facilities on Monday. Patient had hospice services earlier this year but transitioned to outpatient palliative. Son asked [employer] representative to see if we could consult palliative and see if he qualifies for hospice again. Sent secure chat to MD.   Expected Discharge Plan:  (TBD) Barriers to Discharge: Continued Medical Work up  Expected Discharge Plan and Services Expected Discharge Plan:  (TBD)     Post Acute Care Choice:  (TBD) Living arrangements for the past 2 months: Single Family Home                                       Social Determinants of Health (SDOH) Interventions    Readmission Risk Interventions     No data to display

## 2022-01-28 DIAGNOSIS — I5022 Chronic systolic (congestive) heart failure: Secondary | ICD-10-CM

## 2022-01-28 DIAGNOSIS — Z95810 Presence of automatic (implantable) cardiac defibrillator: Secondary | ICD-10-CM | POA: Diagnosis not present

## 2022-01-28 DIAGNOSIS — I480 Paroxysmal atrial fibrillation: Secondary | ICD-10-CM | POA: Diagnosis not present

## 2022-01-28 DIAGNOSIS — R531 Weakness: Secondary | ICD-10-CM | POA: Diagnosis not present

## 2022-01-28 DIAGNOSIS — I5023 Acute on chronic systolic (congestive) heart failure: Secondary | ICD-10-CM | POA: Diagnosis not present

## 2022-01-28 DIAGNOSIS — R627 Adult failure to thrive: Secondary | ICD-10-CM | POA: Diagnosis not present

## 2022-01-28 LAB — BASIC METABOLIC PANEL
Anion gap: 5 (ref 5–15)
BUN: 40 mg/dL — ABNORMAL HIGH (ref 8–23)
CO2: 27 mmol/L (ref 22–32)
Calcium: 9.2 mg/dL (ref 8.9–10.3)
Chloride: 112 mmol/L — ABNORMAL HIGH (ref 98–111)
Creatinine, Ser: 1.16 mg/dL (ref 0.61–1.24)
GFR, Estimated: >60 mL/min (ref >60)
Glucose, Bld: 101 mg/dL — ABNORMAL HIGH (ref 70–99)
Potassium: 3.8 mmol/L (ref 3.5–5.1)
Sodium: 144 mmol/L (ref 135–145)

## 2022-01-28 LAB — HCV AB W REFLEX TO QUANT PCR: HCV Ab: NONREACTIVE

## 2022-01-28 LAB — HCV INTERPRETATION

## 2022-01-28 MED ORDER — ZOLPIDEM TARTRATE 5 MG PO TABS
5.0000 mg | ORAL_TABLET | Freq: Every evening | ORAL | Status: DC | PRN
  Administered 2022-01-28 – 2022-02-01 (×5): 5 mg via ORAL
  Filled 2022-01-28 (×5): qty 1

## 2022-01-28 MED ORDER — FUROSEMIDE 20 MG PO TABS: 20.0000 mg | ORAL_TABLET | Freq: Every day | ORAL | 0 refills | Status: DC

## 2022-01-28 NOTE — Progress Notes (Signed)
[facility]) Hospital Liaison Note   MSW notified by TOC/Sarah that ALF has been found for patient in [place], [place]. Unfortunately, [facility] does not cover this area. TOC reports that other options for hospice services may need to be explored.  [facility] to continue to follow peripherally and are available if needed.    Please call with any questions/concerns.    Thank you for the opportunity to participate in this patient's care.   Daphene Calamity, MSW [facility] Liaison

## 2022-01-28 NOTE — Discharge Summary (Addendum)
Physician Discharge Summary  Louis Reid YJE:[id number] DOB: July 22, 1932 DOA: 01/25/2022  PCP: [name], MD  Admit date: 01/25/2022 Discharge date: 02/01/2022  Admitted From: home  Disposition:  [name] w/ hospice   Recommendations for Outpatient Follow-up:  F/u w/ hospice provider as soon as possible  Follow up with PCP in 1-2 weeks [name], [name], [name] on February 03, 2022.  Home Health: Equipment/Devices:  Discharge Condition: stable  CODE STATUS: DNR Diet recommendation: Heart Healthy   Brief/Interim Summary: HPI was taken from [name]: Louis Reid is a 88 y.o. male with medical history significant for history of coronary disease status post CABG in 1996, STEMI in 2013, ischemic cardiomyopathy, end-stage combined diastolic and systolic CHF with last documented LVEF of 20 to 25%, status post AICD in 2014, hypertension PAF on anti-coagulation and hyperlipidemia.  At baseline, patient lives by himself and is followed by the palliative care team at home.  He has had progressive worsening exertional dyspnea over the past several weeks.  Last seen in the ED on 01/16/2022 on account of shortness of breath.  Patient was seen evaluated and discharged home.  He returns to the emergency room today, barely, 9 days after on account of worsening shortness of breath.  At this visit however, patient complains of his defibrillator going off.  This he describes as making him feel miserable.  He lives by himself and feels he is unable to take care of himself now.  He is requesting to be possibly placed in a skilled nursing facility.  He was admitted in February 2023 on account of COVID-19 infection.  Lisinopril was apparently discontinued on account of fatigue.  He was also seen in cardiology clinic on 12/15/2021.   In the ED today, device was interrogated and as reported in ED, no reported arrhythmias.  AICD battery however was noted to be low(3 months  remaining).  He however denies any chest pain on this admission.  Denies any worsening lower extremity edema.  As per Dr. Jimmye Norman 01/28/22-02/01/22: Pt was found to have acute on chronic combined CHF and was treated w/ lasix.  Pt has been unable to tolerate beta blockers & ACE-I/ARBs. Of note, pt has hx of ICD that appears that the battery is running out. Pt wants to think over this decision first and has f/u appt w/ cardio on 02/03/22. No further inpatient cardiac tested was planned as per cardio. Of note, PT/OT evaluated pt and recommends to higher level of care, ALF. Pt finally agreed to go to [facility]. For more information, please see previous progress/consult notes.    Discharge Diagnoses:  Principal Problem:   Failure to thrive in adult Active Problems:   Paroxysmal atrial fibrillation (HCC)   Essential hypertension   Coronary artery disease   History of coronary artery bypass surgery   Pulmonary embolism (HCC)   ICD (implantable cardioverter-defibrillator) in place   CKD (chronic kidney disease) stage 2, GFR 60-89 ml/min   Chronic HFrEF (heart failure with reduced ejection fraction) ([place])   Stage 3b chronic kidney disease ([place])  Failure to thrive: main reason for coming to ED is severe dyspnea, inability to care for self at home  Anxiety: severity unknown. Xanax prn    Acute on chronic combined CHF: continue on lasix as per cardio. Monitor I/Os.    CAD: s/p CABG. Continue on statin.   Hx of VT/VF: Has ICD that appears to be running out of battery & has f/u  appt w/ cardio w/ [name], [name] on February 03, 2022.  Thrombocytopenia: etiology unclear. Labile. HIV and HCV are both non-reactive    PAF: rate controlled. Continue on eliquis    BPH: continue on home dose of finasteride    Likely severe protein calorie malnutrition: continue on nutritional supplements    Discharge Instructions  Discharge Instructions     Diet - low sodium heart healthy    Complete by: As directed    Discharge instructions   Complete by: As directed    F/u w/ PCP in 1-2 weeks. [name], [name], [name], on February 03, 2022.   Increase activity slowly   Complete by: As directed       Allergies as of 02/01/2022   No Known Allergies      Medication List     STOP taking these medications    ascorbic acid 500 MG tablet Commonly known as: VITAMIN C       TAKE these medications    acetaminophen 500 MG tablet Commonly known as: TYLENOL Take 1 tablet (500 mg total) by mouth every 6 (six) hours as needed for mild pain (or Fever >/= 101).   ALPRAZolam 0.25 MG tablet Commonly known as: XANAX Take 1 tablet (0.25 mg total) by mouth 2 (two) times daily as needed for up to 3 days for anxiety or sleep.   apixaban 2.5 MG Tabs tablet Commonly known as: Eliquis Take 1 tablet (2.5 mg total) by mouth 2 (two) times daily.   D3-1000 25 MCG (1000 UT) capsule Generic drug: Cholecalciferol Take 2,000 Units by mouth daily.   feeding supplement Liqd Take 237 mLs by mouth 3 (three) times daily between meals.   ferrous sulfate 325 (65 FE) MG tablet Take 325 mg by mouth daily with breakfast.   finasteride 5 MG tablet Commonly known as: PROSCAR Take 5 mg by mouth daily.   furosemide 20 MG tablet Commonly known as: LASIX Take 1 tablet (20 mg total) by mouth daily. What changed: how much to take   ICAPS AREDS 2 PO Take 1 capsule by mouth 2 (two) times daily.   oxyCODONE-acetaminophen 5-325 MG tablet Commonly known as: Percocet Take 1 tablet by mouth every 6 (six) hours as needed for up to 2 days for severe pain or moderate pain.   rosuvastatin 20 MG tablet Commonly known as: CRESTOR Take 20 mg by mouth daily.   senna-docusate 8.6-50 MG tablet Commonly known as: Senokot-S Take 1 tablet by mouth 2 (two) times daily.   zinc sulfate 220 (50 Zn) MG capsule Take 220 mg by mouth daily.        No Known  Allergies  Consultations: Cardio EP   Procedures/Studies: CUP PACEART REMOTE DEVICE CHECK  Result Date: 01/26/2022 MOD:  Pt. currently hospitalized Scheduled remote reviewed. Device reached ERI 8/19 Last device check 12/24/2020 Events with successful ATPx1 1/22 and 7/24, EGM's show sustained VT Multiple AMS for PAT and brief atrial lead noise Next remote 91 days. Route to triage for awareness LA  CT HEAD WO CONTRAST (5MM)  Result Date: 01/25/2022 CLINICAL DATA:  Dizziness, weakness, fall EXAM: CT HEAD WITHOUT CONTRAST TECHNIQUE: Contiguous axial images were obtained from the base of the skull through the vertex without intravenous contrast. RADIATION DOSE REDUCTION: This exam was performed according to the departmental dose-optimization program which includes automated exposure control, adjustment of the mA and/or kV according to patient size and/or use of iterative reconstruction technique. COMPARISON:  07/18/2021 FINDINGS: Brain: No acute  intracranial findings are seen. There are no signs of bleeding within the cranium. Ventricles are nondilated. Cortical sulci are prominent. There is decreased density in periventricular white matter. Vascular: Unremarkable. Skull: Unremarkable. Sinuses/Orbits: Unremarkable. Other: None. IMPRESSION: No acute intracranial findings are seen. Atrophy. Small vessel disease. Electronically Signed   By: Elmer Picker M.D.   On: 01/25/2022 19:33   DG Chest 2 View  Result Date: 01/25/2022 CLINICAL DATA:  Dizziness, weakness, cardiac arrhythmia EXAM: CHEST - 2 VIEW COMPARISON:  07/29/2021 FINDINGS: Transverse diameter of heart is increased. There is previous coronary bypass surgery. Pacemaker/defibrillator battery is seen in left infraclavicular region. There are no signs of pulmonary edema or focal pulmonary consolidation. Small transverse linear density in the right lower lung field may suggest scarring. Low position of the diaphragms suggests COPD. There is  minimal blunting of left lateral CP angle. There is no pneumothorax. IMPRESSION: Cardiomegaly. There are no signs of pulmonary edema or focal pulmonary consolidation. Blunting of left lateral CP angle may suggest minimal effusion or pleural thickening. Electronically Signed   By: Elmer Picker M.D.   On: 01/25/2022 19:30   (Echo, Carotid, EGD, Colonoscopy, ERCP)    Subjective: Pt c/o fatigue    Discharge Exam: Vitals:   01/31/22 2327 02/01/22 0744  BP: 117/83 108/72  Pulse: 70 65  Resp: 17 18  Temp: (!) 97.5 F (36.4 C) 97.7 F (36.5 C)  SpO2: 98% 96%   Vitals:   01/31/22 1916 01/31/22 2327 02/01/22 0300 02/01/22 0744  BP: 117/81 117/83  108/72  Pulse: 62 70  65  Resp: '18 17  18  '$ Temp: 98.2 F (36.8 C) (!) 97.5 F (36.4 C)  97.7 F (36.5 C)  TempSrc:      SpO2: 91% 98%  96%  Weight:   55.2 kg   Height:        General: Pt is alert, awake, not in acute distress Cardiovascular:S1/S2 +, no rubs, no gallops Respiratory: CTA bilaterally, no wheezing, no rhonchi Abdominal: Soft, NT, ND, bowel sounds + Extremities: no edema, no cyanosis    The results of significant diagnostics from this hospitalization (including imaging, microbiology, ancillary and laboratory) are listed below for reference.     Microbiology: Recent Results (from the past 240 hour(s))  SARS Coronavirus 2 by RT PCR (hospital order, performed in [facility] hospital lab) *cepheid single result test* Anterior Nasal Swab     Status: None   Collection Time: 01/25/22  7:45 PM   Specimen: Anterior Nasal Swab  Result Value Ref Range Status   SARS Coronavirus 2 by RT PCR NEGATIVE NEGATIVE Final    Comment: (NOTE) SARS-CoV-2 target nucleic acids are NOT DETECTED.  The SARS-CoV-2 RNA is generally detectable in upper and lower respiratory specimens during the acute phase of infection. The lowest concentration of SARS-CoV-2 viral copies this assay can detect is 250 copies / mL. A negative result does not  preclude SARS-CoV-2 infection and should not be used as the sole basis for treatment or other patient management decisions.  A negative result may occur with improper specimen collection / handling, submission of specimen other than nasopharyngeal swab, presence of viral mutation(s) within the areas targeted by this assay, and inadequate number of viral copies (<250 copies / mL). A negative result must be combined with clinical observations, patient history, and epidemiological information.  Fact Sheet for Patients:   https://www.patel.info/  Fact Sheet for Healthcare Providers: https://hall.com/  This test is not yet approved or  cleared by the  [place] Architectural technologist and has been authorized for detection and/or diagnosis of SARS-CoV-2 by FDA under an Print production planner (EUA).  This EUA will remain in effect (meaning this test can be used) for the duration of the COVID-19 declaration under Section 564(b)(1) of the Act, 21 U.S.C. section 360bbb-3(b)(1), unless the authorization is terminated or revoked sooner.  Performed at [facility], [address] [place], [place] [address]      Labs: BNP (last 3 results) Recent Labs    07/18/21 1351 07/29/21 1259 01/25/22 1349  BNP 371.8* 466.1* 169.6*   Basic Metabolic Panel: Recent Labs  Lab 01/28/22 0645 01/29/22 0550 01/30/22 0506 01/31/22 0507 02/01/22 0513  NA 144 143 141 141 143  K 3.8 3.8 3.7 3.8 3.9  CL 112* 112* 108 112* 112*  CO2 '27 26 28 26 25  '$ GLUCOSE 101* 102* 118* 120* 93  BUN 40* 41* 46* 47* 46*  CREATININE 1.16 1.09 1.16 1.06 1.10  CALCIUM 9.2 9.1 9.1 9.0 9.2  MG  --  2.3  --   --   --    Liver Function Tests: No results for input(s): "AST", "ALT", "ALKPHOS", "BILITOT", "PROT", "ALBUMIN" in the last 168 hours. No results for input(s): "LIPASE", "AMYLASE" in the last 168 hours. No results for input(s): "AMMONIA" in the last 168 hours. CBC: Recent  Labs  Lab 01/26/22 0513 01/27/22 0605 01/30/22 0506 01/31/22 0507 02/01/22 0513  WBC 5.2 6.1 6.0 5.4 5.6  HGB 11.5* 12.1* 11.3* 11.3* 11.8*  HCT 35.2* 37.4* 35.2* 35.5* 36.4*  MCV 97.2 94.7 94.6 94.7 96.0  PLT 108* 117* 122* 109* 116*   Cardiac Enzymes: No results for input(s): "CKTOTAL", "CKMB", "CKMBINDEX", "TROPONINI" in the last 168 hours. BNP: Invalid input(s): "POCBNP" CBG: No results for input(s): "GLUCAP" in the last 168 hours. D-Dimer No results for input(s): "DDIMER" in the last 72 hours. Hgb A1c No results for input(s): "HGBA1C" in the last 72 hours. Lipid Profile No results for input(s): "CHOL", "HDL", "LDLCALC", "TRIG", "CHOLHDL", "LDLDIRECT" in the last 72 hours. Thyroid function studies No results for input(s): "TSH", "T4TOTAL", "T3FREE", "THYROIDAB" in the last 72 hours.  Invalid input(s): "FREET3" Anemia work up No results for input(s): "VITAMINB12", "FOLATE", "FERRITIN", "TIBC", "IRON", "RETICCTPCT" in the last 72 hours. Urinalysis    Component Value Date/Time   COLORURINE YELLOW (A) 01/25/2022 1945   APPEARANCEUR CLEAR (A) 01/25/2022 1945   LABSPEC 1.020 01/25/2022 1945   PHURINE 5.0 01/25/2022 1945   GLUCOSEU NEGATIVE 01/25/2022 1945   HGBUR NEGATIVE 01/25/2022 1945   BILIRUBINUR NEGATIVE 01/25/2022 1945   KETONESUR NEGATIVE 01/25/2022 1945   PROTEINUR NEGATIVE 01/25/2022 1945   UROBILINOGEN 1.0 05/24/2012 1330   NITRITE NEGATIVE 01/25/2022 1945   LEUKOCYTESUR NEGATIVE 01/25/2022 1945   Sepsis Labs Recent Labs  Lab 01/27/22 0605 01/30/22 0506 01/31/22 0507 02/01/22 0513  WBC 6.1 6.0 5.4 5.6   Microbiology Recent Results (from the past 240 hour(s))  SARS Coronavirus 2 by RT PCR (hospital order, performed in [place] hospital lab) *cepheid single result test* Anterior Nasal Swab     Status: None   Collection Time: 01/25/22  7:45 PM   Specimen: Anterior Nasal Swab  Result Value Ref Range Status   SARS Coronavirus 2 by RT PCR NEGATIVE  NEGATIVE Final    Comment: (NOTE) SARS-CoV-2 target nucleic acids are NOT DETECTED.  The SARS-CoV-2 RNA is generally detectable in upper and lower respiratory specimens during the acute phase of infection. The lowest concentration of SARS-CoV-2 viral  copies this assay can detect is 250 copies / mL. A negative result does not preclude SARS-CoV-2 infection and should not be used as the sole basis for treatment or other patient management decisions.  A negative result may occur with improper specimen collection / handling, submission of specimen other than nasopharyngeal swab, presence of viral mutation(s) within the areas targeted by this assay, and inadequate number of viral copies (<250 copies / mL). A negative result must be combined with clinical observations, patient history, and epidemiological information.  Fact Sheet for Patients:   https://www.patel.info/  Fact Sheet for Healthcare Providers: https://hall.com/  This test is not yet approved or  cleared by the [place] FDA and has been authorized for detection and/or diagnosis of SARS-CoV-2 by FDA under an Emergency Use Authorization (EUA).  This EUA will remain in effect (meaning this test can be used) for the duration of the COVID-19 declaration under Section 564(b)(1) of the Act, 21 U.S.C. section 360bbb-3(b)(1), unless the authorization is terminated or revoked sooner.  Performed at [facility], [address], [place], [place] [address]      Time coordinating discharge: Over 30 minutes  SIGNED:   Wyvonnia Dusky, MD  [facility] 02/01/2022, 9:56 AM Pager   If 7PM-7AM, please contact night-coverage [email or web address]

## 2022-01-28 NOTE — Progress Notes (Signed)
Physical Therapy Treatment Patient Details Name: Louis Reid MRN: [id number] DOB: Apr 24, 1933 Today's Date: 01/28/2022   History of Present Illness The patient is an 88 yo male that presented to the ED for weakness and inability to care for himself. PMH of recent hospital admissions, coronary disease, heart failure ejection fraction 20%, paroxysmal atrial fibrillation, chronic kidney disease stage IIIb, BPH, hypertension, dyslipidemia, PE, was on hospice but transitioned to palliative, MI, AICD, Covid 2/23.    PT Comments    Pt received upright in bed agreeable to PT. Able to safely perform 2 bouts of 30' with RW with minguard to close SBA.  Min VC's for upright posture and RW proximity to assist in reduced risk of falls, energy conservation, and upright posture. Pt returned to supine with all needs in place and MD at bedside. HR remains in mid 70's BPM at rest and with activity and 2 min seated rest b/t bouts. Current D/c recs remain appropriate.    Recommendations for follow up therapy are one component of a multi-disciplinary discharge planning process, led by the attending physician.  Recommendations may be updated based on patient status, additional functional criteria and insurance authorization.  Follow Up Recommendations  Other (comment)     Assistance Recommended at Discharge Intermittent Supervision/Assistance  Patient can return home with the following Assistance with cooking/housework;Assist for transportation;Help with stairs or ramp for entrance;Direct supervision/assist for medications management   Equipment Recommendations  Wheelchair cushion (measurements PT);Wheelchair (measurements PT)    Recommendations for Other Services       Precautions / Restrictions Precautions Precautions: Fall Restrictions Weight Bearing Restrictions: No     Mobility  Bed Mobility Overal bed mobility: Modified Independent               Patient Response:  Cooperative  Transfers Overall transfer level: Needs assistance Equipment used: Rolling walker (2 wheels) Transfers: Sit to/from Stand Sit to Stand: Supervision                Ambulation/Gait Ambulation/Gait assistance: Min guard Gait Distance (Feet): 60 Feet (2x30') Assistive device: Rolling walker (2 wheels) Gait Pattern/deviations: Step-through pattern, Decreased step length - right, Decreased step length - left       General Gait Details: VC's for upright posture and RW proximity   Stairs             Wheelchair Mobility    Modified Rankin (Stroke Patients Only)       Balance Overall balance assessment: Needs assistance Sitting-balance support: Feet supported Sitting balance-Leahy Scale: Good       Standing balance-Leahy Scale: Fair                              Cognition Arousal/Alertness: Awake/alert Behavior During Therapy: WFL for tasks assessed/performed Overall Cognitive Status: Within Functional Limits for tasks assessed                                          Exercises General Exercises - Lower Extremity Long Arc Quad: AROM, Strengthening, Both, 10 reps, Seated    General Comments        Pertinent Vitals/Pain Pain Assessment Pain Assessment: No/denies pain    Home Living  Prior Function            PT Goals (current goals can now be found in the care plan section) Acute Rehab PT Goals Patient Stated Goal: to get the support/help that he needs at discharge PT Goal Formulation: With patient Time For Goal Achievement: 02/10/22 Potential to Achieve Goals: Fair Progress towards PT goals: Progressing toward goals    Frequency    Min 2X/week      PT Plan Current plan remains appropriate    Co-evaluation              AM-PAC PT "6 Clicks" Mobility   Outcome Measure  Help needed turning from your back to your side while in a flat bed without using  bedrails?: None Help needed moving from lying on your back to sitting on the side of a flat bed without using bedrails?: None Help needed moving to and from a bed to a chair (including a wheelchair)?: None Help needed standing up from a chair using your arms (e.g., wheelchair or bedside chair)?: None Help needed to walk in hospital room?: A Little Help needed climbing 3-5 steps with a railing? : A Little 6 Click Score: 22    End of Session Equipment Utilized During Treatment: Gait belt Activity Tolerance: Patient tolerated treatment well Patient left: in bed;with call bell/phone within reach;with bed alarm set (MD at bedside) Nurse Communication: Mobility status PT Visit Diagnosis: Other abnormalities of gait and mobility (R26.89);Difficulty in walking, not elsewhere classified (R26.2);Muscle weakness (generalized) (M62.81)     Time: 4680-3212 PT Time Calculation (min) (ACUTE ONLY): 18 min  Charges:  $Therapeutic Exercise: 8-22 mins                     Binyamin Nelis M. Fairly IV, PT, DPT Physical Therapist- [facility]  01/28/2022, 1:27 PM

## 2022-01-28 NOTE — TOC Progression Note (Addendum)
Transition of Care [facility]) - Progression Note    Patient Details  Name: Louis Reid MRN: [id number] Date of Birth: 23-May-1933  Transition of Care [facility]) CM/SW [place], LCSW Phone Number: 01/28/2022, 9:31 AM  Clinical Narrative:   [facility] has found an ALF in [place], [place] that might be able to accept patient as early as tomorrow. Faxed clinicals for them to review. MD will complete dc summary because they will want to see discharge medications. [facility] does not think they service [facility] but representative will check. If not, will need to get another hospice agency.  11:50 am: [facility] leadership is checking to see if they can do a one-time contract with the ALF.  2:46 pm: Patient upset about prospect of going to ALF in [place] because it's far from his friends and further from his son. [facility] representative is aware and will speak with him in the morning.  Expected Discharge Plan:  (TBD) Barriers to Discharge: Continued Medical Work up  Expected Discharge Plan and Services Expected Discharge Plan:  (TBD)     Post Acute Care Choice:  (TBD) Living arrangements for the past 2 months: Single Family Home                                       Social Determinants of Health (SDOH) Interventions    Readmission Risk Interventions     No data to display

## 2022-01-28 NOTE — Progress Notes (Addendum)
Progress Note  Patient Name: Louis Reid Date of Encounter: 01/28/2022  [facility] Cardiologist: Kirk Ruths, MD   Subjective   No acute events overnight, evaluated by EP earlier today regarding GEN change for ICD.  Wants to think about this.  Otherwise patient states being very weak, waiting possible placement to skilled nursing facility.  Inpatient Medications    Scheduled Meds:  apixaban  2.5 mg Oral BID   ascorbic acid  500 mg Oral Daily   cholecalciferol  2,000 Units Oral Daily   feeding supplement  237 mL Oral TID BM   ferrous sulfate  325 mg Oral Q breakfast   finasteride  5 mg Oral Daily   furosemide  20 mg Oral Daily   melatonin  5 mg Oral QHS   multivitamin with minerals  1 tablet Oral Daily   rosuvastatin  20 mg Oral Daily   senna-docusate  1 tablet Oral BID   simethicone  40 mg Oral Once   sodium chloride flush  3 mL Intravenous Q12H   zinc sulfate  220 mg Oral Daily   Continuous Infusions:  sodium chloride     PRN Meds: sodium chloride, acetaminophen, ondansetron (ZOFRAN) IV, sodium chloride flush, zolpidem   Vital Signs    Vitals:   01/28/22 0500 01/28/22 0528 01/28/22 0528 01/28/22 0741  BP:  (!) 102/55 (!) 102/55 122/82  Pulse:  (!) 48 62 89  Resp:  '14 14 19  '$ Temp:  97.8 F (36.6 C) 97.8 F (36.6 C) 98.2 F (36.8 C)  TempSrc:      SpO2:  97% 98% (!) 89%  Weight: 54.1 kg     Height:        Intake/Output Summary (Last 24 hours) at 01/28/2022 1051 Last data filed at 01/28/2022 1000 Gross per 24 hour  Intake 100 ml  Output 1550 ml  Net -1450 ml      01/28/2022    5:00 AM 01/27/2022    4:50 AM 01/26/2022    4:40 AM  Last 3 Weights  Weight (lbs) 119 lb 4.3 oz 115 lb 14.4 oz 119 lb 11.4 oz  Weight (kg) 54.1 kg 52.572 kg 54.3 kg      Telemetry    Sinus with occasional atrial paced rhythm- Personally Reviewed  ECG     - Personally Reviewed  Physical Exam   GEN: No acute distress.  Appears cachectic Neck: No JVD Cardiac:  RRR Respiratory: Clear to auscultation bilaterally. GI: Soft, nontender, non-distended  MS: No edema; No deformity. Neuro:  Nonfocal  Psych: Normal affect   Labs    High Sensitivity Troponin:   Recent Labs  Lab 01/25/22 2335 01/26/22 0223  TROPONINIHS 17 20*     Chemistry Recent Labs  Lab 01/26/22 0513 01/27/22 0605 01/28/22 0645  NA 144 143 144  K 3.7 3.8 3.8  CL 112* 111 112*  CO2 '27 26 27  '$ GLUCOSE 97 108* 101*  BUN 33* 35* 40*  CREATININE 1.14 1.14 1.16  CALCIUM 8.8* 9.0 9.2  GFRNONAA >60 >60 >60  ANIONGAP '5 6 5    '$ Lipids No results for input(s): "CHOL", "TRIG", "HDL", "LABVLDL", "LDLCALC", "CHOLHDL" in the last 168 hours.  Hematology Recent Labs  Lab 01/25/22 1349 01/26/22 0513 01/27/22 0605  WBC 6.7 5.2 6.1  RBC 3.85* 3.62* 3.95*  HGB 11.9* 11.5* 12.1*  HCT 37.2* 35.2* 37.4*  MCV 96.6 97.2 94.7  MCH 30.9 31.8 30.6  MCHC 32.0 32.7 32.4  RDW 14.2 14.2 14.0  PLT 115* 108* 117*   Thyroid No results for input(s): "TSH", "FREET4" in the last 168 hours.  BNP Recent Labs  Lab 01/25/22 1349  BNP 389.1*    DDimer No results for input(s): "DDIMER" in the last 168 hours.   Radiology    No results found.  Cardiac Studies   Echo 01/2020 EF 20 to 25%  Patient Profile     88 y.o. male CAD/CABG x5, ischemic cardiomyopathy EF 20 to 25%, s/p ICD, paroxysmal atrial fibrillation on Eliquis, CKD 3 presenting with progressive decline, weakness, shortness of breath, ICD vibrating/showing ERI.  Being seen for cardiomyopathy and ICD evaluation  Assessment & Plan    Ischemic cardiomyopathy EF 25% -Appears euvolemic -Not tolerant to beta-blockers, ACE/ARB -Continue Lasix 20 mg daily  2.  S/p AICD -Elective replacement indicator noted -Evaluated by EP, patient wants some time to think regarding GEN change -Outpatient follow-up with EP planned.  3.  Paroxysmal atrial fibrillation -Heart rate controlled off AV nodal agents -Continue Eliquis 2.5 mg twice  daily  4.  Weakness, progressive decline -Will need skilled nursing facility -Placement as per primary team/case management  No additional cardiac testing or interventions planned, okay for placement from a cardiac standpoint when bed available.  Continue current meds and recommendations as above, close follow-up with cardiology as outpatient.  Please let us know if additional input is needed.  Total encounter time more than 50 minutes  Greater than 50% was spent in counseling and coordination of care with the patient    Signed, Kate Sable, MD  01/28/2022, 10:51 AM

## 2022-01-28 NOTE — NC FL2 (Signed)
[place] LEVEL OF CARE SCREENING TOOL     IDENTIFICATION  Patient Name: Louis Reid Birthdate: 05/24/1933 Sex: male Admission Date (Current Location): 01/25/2022  [place] and [place] Number:  Engineering geologist and Address:  [facility], [address], [place], [place] [address]      Provider Number: [id number]  Attending Physician Name and Address:  Wyvonnia Dusky, MD  Relative Name and Phone Number:       Current Level of Care: Hospital Recommended Level of Care: [place] (with hospice) Prior Approval Number:    Date Approved/Denied:   PASRR Number:    Discharge Plan: Other (Comment) (ALF with hospice)    Current Diagnoses: Patient Active Problem List   Diagnosis Date Noted   COVID-19 virus infection 07/19/2021   Decubital ulcer 04/04/2021   AKI (acute kidney injury) ([place]) 04/02/2021   Splenic infarct    Hepatic steatosis    Stage 3b chronic kidney disease ([place]) 03/11/2021   Unable to care for self 03/11/2021   Constipation 03/11/2021   Failure to thrive in adult 03/11/2021   Protein-calorie malnutrition, severe 02/22/2021   Generalized weakness    Palliative care encounter    Goals of care, counseling/discussion    Encounter for hospice care discussion    Heart failure ([place]) 02/28/2018   Decreased cardiac ejection fraction 01/10/2018   Nonspecific chest pain    Chronic HFrEF (heart failure with reduced ejection fraction) ([place])    Unstable angina ([place]):  (01/20/2017) a. 3-v CAD w/ patent SVG to diag and patent sequential SVG to OM 2 and OM 3. Patent prox LAD stent. LIMA to LAD and SVG to RCA known occl. stable prox RCA sten  01/19/2017   Paroxysmal atrial fibrillation ([facility]) 03/17/2016   Claudication ([place]) 01/22/2014   CKD (chronic kidney disease) stage 2, GFR 60-89 ml/min 12/26/2013   ICD (implantable cardioverter-defibrillator) in place 02/16/2013   Postop check 07/05/2012   STEMI (ST  elevation myocardial infarction) ([place]) 05/18/2012   Anemia in chronic kidney disease(285.21) 05/17/2012   Pulmonary embolism ([place]) 05/17/2012   Physical deconditioning 05/17/2012   Anorexia 05/17/2012   Cardiogenic shock ([place]) 05/16/2012   Arterial hypotension 05/16/2012   Ventricular fibrillation arrest 05/10/2012   Cardiac arrest ([place]) 05/10/2012   Acute respiratory failure with hypoxia 2/2 cardiac arrest 05/10/2012   Acute encephalopathy 2/2 cardiac arrest 05/10/2012   Hyperglycemia 05/10/2012   Odynophagia 05/07/2012   Coronary artery disease 07/09/2011   History of coronary artery bypass surgery 07/09/2011   DYSPNEA 06/04/2010   Hyperlipidemia, unspecified 04/17/2009   Essential hypertension 04/17/2009   Asymptomatic old MI (myocardial infarction) 04/17/2009   TOBACCO USE, QUIT 04/17/2009    Orientation RESPIRATION BLADDER Height & Weight     Self, Time, Situation, Place  Normal Continent Weight: 119 lb 4.3 oz (54.1 kg) Height:  '5\' 11"'$  (180.3 cm)  BEHAVIORAL SYMPTOMS/MOOD NEUROLOGICAL BOWEL NUTRITION STATUS   (None)  (None) Continent Diet (Regular)  AMBULATORY STATUS COMMUNICATION OF NEEDS Skin   Limited Assist Verbally Other (Comment) (Erythema/redness.)                       Personal Care Assistance Level of Assistance  Bathing, Feeding, Dressing Bathing Assistance: Limited assistance Feeding assistance: Independent Dressing Assistance: Limited assistance     Functional Limitations Info  Sight, Hearing, Speech Sight Info: Adequate Hearing Info: Adequate Speech Info: Adequate    SPECIAL CARE FACTORS FREQUENCY  Contractures Contractures Info: Not present    Additional Factors Info  Code Status, Allergies Code Status Info: DNR Allergies Info: NKDA           Current Medications (01/28/2022):  This is the current hospital active medication list Current Facility-Administered Medications  Medication Dose Route Frequency  Provider Last Rate Last Admin   0.9 %  sodium chloride infusion  250 mL Intravenous PRN Acheampong, Warnell Bureau, MD       acetaminophen (TYLENOL) tablet 650 mg  650 mg Oral Q4H PRN Acheampong, Warnell Bureau, MD       apixaban Arne [place]) tablet 2.5 mg  2.5 mg Oral BID Gwynne Edinger, MD   2.5 mg at 01/27/22 2109   ascorbic acid (VITAMIN C) tablet 500 mg  500 mg Oral Daily Acheampong, Warnell Bureau, MD   500 mg at 01/27/22 7824   cholecalciferol (VITAMIN D3) 25 MCG (1000 UNIT) tablet 2,000 Units  2,000 Units Oral Daily Artist Beach, MD   2,000 Units at 01/27/22 0955   feeding supplement (ENSURE ENLIVE / ENSURE PLUS) liquid 237 mL  237 mL Oral TID BM Artist Beach, MD   237 mL at 01/27/22 2113   ferrous sulfate tablet 325 mg  325 mg Oral Q breakfast Artist Beach, MD   325 mg at 01/27/22 0955   finasteride (PROSCAR) tablet 5 mg  5 mg Oral Daily Acheampong, Warnell Bureau, MD   5 mg at 01/27/22 2353   furosemide (LASIX) tablet 20 mg  20 mg Oral Daily Theora Gianotti, NP       melatonin tablet 5 mg  5 mg Oral QHS Athena Masse, MD   5 mg at 01/27/22 2112   multivitamin with minerals tablet 1 tablet  1 tablet Oral Daily Gwynne Edinger, MD   1 tablet at 01/27/22 0955   ondansetron (ZOFRAN) injection 4 mg  4 mg Intravenous Q6H PRN Acheampong, Warnell Bureau, MD       rosuvastatin (CRESTOR) tablet 20 mg  20 mg Oral Daily Theora Gianotti, NP   20 mg at 01/27/22 6144   senna-docusate (Senokot-S) tablet 1 tablet  1 tablet Oral BID Artist Beach, MD   1 tablet at 01/27/22 2109   simethicone (MYLICON) 40 RX/5.4MG suspension 40 mg  40 mg Oral Once Foust, Katy L, NP       sodium chloride flush (NS) 0.9 % injection 3 mL  3 mL Intravenous Q12H Acheampong, Warnell Bureau, MD   3 mL at 01/27/22 2114   sodium chloride flush (NS) 0.9 % injection 3 mL  3 mL Intravenous PRN Acheampong, Warnell Bureau, MD       zinc sulfate capsule 220 mg  220 mg Oral Daily Acheampong, Warnell Bureau, MD   220 mg at 01/27/22 0955    zolpidem (AMBIEN) tablet 5 mg  5 mg Oral QHS PRN Athena Masse, MD   5 mg at 01/28/22 8676     Discharge Medications: Please see discharge summary for a list of discharge medications.  Relevant Imaging Results:  Relevant Lab Results:   Additional Information SS#: [id number]  Candie Chroman, LCSW

## 2022-01-28 NOTE — Consult Note (Signed)
Electrophysiology Consultation:   Patient ID: NICHOLAOS SCHIPPERS MRN: [id number]; DOB: 15-Sep-1932  Admit date: 01/25/2022 Date of Consult: 01/28/2022  PCP:  [name], MD   [facility] Providers Cardiologist:  Kirk Ruths, MD  Cardiology APP:  Georgiana Shore, NP (Inactive) [facility], Tami Lin, [place]  Electrophysiologist:  Vickie Epley, MD  {  Patient Profile:   GIANNY SABINO is a 88 y.o. male with a hx of coronary artery disease post CABG in 0626, chronic systolic heart failure secondary to ischemic cardiomyopathy, paroxysmal atrial fibrillation on Eliquis, hypertension, hyperlipidemia, CKD 3, ICD in situ who is being seen 01/28/2022 for the evaluation of ICD at the request of Dr. Saunders Revel.  History of Present Illness:   Mr. Rousseau was admitted to the hospital January 25, 2022 for acutely decompensated systolic heart failure.  He also reported that his ICD vibrated prior to admission.  He received IV diuretics and is felt to be euvolemic.  ICD interrogation this admission shows the device reached ERI January 24, 2022.  He has previously been managed by Dr. Rayann Heman.   Past Medical History:  Diagnosis Date   AICD (automatic cardioverter/defibrillator) present 09/13/2012   Anemia    CAD (coronary artery disease)    a. 1996 CABG x 5: LIMA->LAD, VG->D2, VG->OM1->OM2, VG->RCA; b. S/P LAD BMS in setting of atretic LIMA; c. 09/2001 PTCA/BrachyRx to LAD 2/2 ISR; d. 05/2012 VF Arrest PCI: Sev 3v dzs, VG->RCA 100, LIMA atretic, LAD 100 (PCI w/ POBA and thrombectomy);  c. 01/2017 Cath: LM mild dzs, LAD patent stent, D1 small, LCX 100, RCA 70p (FFR 0.88), VG->OM1->OM2 ok, VG->D2 ok. LIMA atretic. VG->RCA 100.   Chronic HFrEF (heart failure with reduced ejection fraction) ([place])    a. Echo 12/13: EF 20-25%; b. Echo 3/14: EF 25%; c. 01/2020 Echo: EF 20-25%, no rwma, nl RV fxn, triv MR.   Clotting disorder ([place])    Depression    "years ago" (02/28/2018)   History of kidney stones    HLD  (hyperlipidemia)    Hypertension    "I've never had high blood pressure; on RX for other reasons" (02/28/2018)   Inguinal hernia    "have one now on my left side" (09/13/2012); (02/28/2018)   Ischemic cardiomyopathy    a. Echo 12/13: EF 20-25%; b. Echo 3/14: EF 25%; c. 09/2012 s/p Abbott Ellipse DR 9485-46E DC AICD (ser # [id number]); d. 01/2020 Echo: EF 20-25%.   Myocardial infarction [facility]) 1996;  05/2012   Osteoporosis    Paroxysmal atrial fibrillation ([place]) 03/17/2016   a. noted on device interrogation. CHA2DS2VASc = 5-->Eliquis 2.5 bid.   Pectus excavatum    Pulmonary embolism ([place])    a. after adhesiolysis for SBO in 04/2012 => coumadin for 6 mos   Renal cyst    SBO (small bowel obstruction) ([place]) 04/2012   s/p lysis of adhesions   Sinoatrial node dysfunction ([place])    Skin cancer    "left ear; face" (02/28/2018)   Ventricular fibrillation ([place]) 05/2012   . in setting of AL STEMI 12/13 => EF 20-25% => d/c on LifeVest (repeat echo planned in 08/2012)    Past Surgical History:  Procedure Laterality Date   CATARACT EXTRACTION W/ INTRAOCULAR LENS  IMPLANT, BILATERAL Bilateral    COLONOSCOPY     CORONARY ANGIOPLASTY WITH STENT PLACEMENT     "I've got 2 or 3 stents" (02/28/2018)   [facility]   "CABG X5" (09/13/2012)   CYSTOSCOPY/RETROGRADE/URETEROSCOPY/STONE EXTRACTION WITH  BASKET  1990's   ESOPHAGOGASTRODUODENOSCOPY  05/08/2012   Procedure: ESOPHAGOGASTRODUODENOSCOPY (EGD);  Surgeon: Juanita Craver, MD;  [facility] ENDOSCOPY;  Service: Endoscopy;  Laterality: N/A;  Nevin Bloodgood put on for Dr. Collene Mares , Dr. Collene Mares will call if she wants another time   IMPLANTABLE CARDIOVERTER DEFIBRILLATOR IMPLANT N/A 09/13/2012   SJM Ellipse ER implanted by Dr Rayann Heman for secondary prevention   INGUINAL HERNIA REPAIR Right 1996   INTRAVASCULAR PRESSURE WIRE/FFR STUDY N/A 01/20/2017   Procedure: INTRAVASCULAR PRESSURE WIRE/FFR STUDY;  Surgeon: Wellington Hampshire, MD;  Location: [place] CV LAB;   Service: Cardiovascular;  Laterality: N/A;   LAPAROTOMY  04/27/2012   Procedure: EXPLORATORY LAPAROTOMY;  Surgeon: Gwenyth Ober, MD;  Location: [place];  Service: General;  Laterality: N/A;   LEFT HEART CATH AND CORS/GRAFTS ANGIOGRAPHY N/A 01/20/2017   Procedure: LEFT HEART CATH AND CORS/GRAFTS ANGIOGRAPHY;  Surgeon: Wellington Hampshire, MD;  Location: [place] [facility];  Service: Cardiovascular;  Laterality: N/A;   LEFT HEART CATHETERIZATION WITH CORONARY ANGIOGRAM N/A 05/10/2012   Procedure: LEFT HEART CATHETERIZATION WITH CORONARY ANGIOGRAM;  Surgeon: Burnell Blanks, MD;  Location: [facility] CATH LAB;  Service: Cardiovascular;  Laterality: N/A;   PTCA     percutaneous transluminal coronary intervention and brachy therapy, Bruce R. Olevia Perches, MD. EF 60%   RIGHT HEART CATH N/A 03/01/2018   Procedure: RIGHT HEART CATH;  Surgeon: Jolaine Artist, MD;  Location: [place] CV LAB;  Service: Cardiovascular;  Laterality: N/A;   SKIN CANCER EXCISION     "left ear; face" (02/28/2018)   TRANSURETHRAL RESECTION OF PROSTATE         Inpatient Medications: Scheduled Meds:  apixaban  2.5 mg Oral BID   ascorbic acid  500 mg Oral Daily   cholecalciferol  2,000 Units Oral Daily   feeding supplement  237 mL Oral TID BM   ferrous sulfate  325 mg Oral Q breakfast   finasteride  5 mg Oral Daily   furosemide  20 mg Oral Daily   melatonin  5 mg Oral QHS   multivitamin with minerals  1 tablet Oral Daily   rosuvastatin  20 mg Oral Daily   senna-docusate  1 tablet Oral BID   simethicone  40 mg Oral Once   sodium chloride flush  3 mL Intravenous Q12H   zinc sulfate  220 mg Oral Daily   Continuous Infusions:  sodium chloride     PRN Meds: sodium chloride, acetaminophen, ondansetron (ZOFRAN) IV, sodium chloride flush, zolpidem  Allergies:   No Known Allergies  Social History:   Social History   Socioeconomic History   Marital status: Widowed    Spouse name: Not on file   Number of children: 1    Years of education: Not on file   Highest education level: Not on file  Occupational History   Occupation: [name]    Employer: RETIRED  Tobacco Use   Smoking status: Former    Types: Cigarettes   Smokeless tobacco: Never   Tobacco comments:    09/14/2011; 02/28/2018  "quit smoking when I was ~ 17; probably didn't smoke 10 packs in my life"  Vaping Use   Vaping Use: Never used  Substance and Sexual Activity   Alcohol use: Never   Drug use: Never   Sexual activity: Not Currently  Other Topics Concern   Not on file  Social History Narrative   Not on file   Social Determinants of Health   Financial  Resource Strain: Low Risk  (01/29/2021)   Overall Financial Resource Strain (CARDIA)    Difficulty of Paying Living Expenses: Not very hard  Food Insecurity: No Food Insecurity (01/27/2021)   Hunger Vital Sign    Worried About Running Out of Food in the Last Year: Never true    Ran Out of Food in the Last Year: Never true  Transportation Needs: No Transportation Needs (01/27/2021)   PRAPARE - Hydrologist (Medical): No    Lack of Transportation (Non-Medical): No  Physical Activity: Not on file  Stress: Not on file  Social Connections: Not on file  Intimate Partner Violence: Not on file    Family History:    Family History  Problem Relation Age of Onset   Heart disease Father    Other Mother        brain tumor   Colon cancer Brother    Cancer Brother        Liver   Heart disease Brother      ROS:  Please see the history of present illness.   All other ROS reviewed and negative.     Physical Exam/Data:   Vitals:   01/27/22 1619 01/27/22 2104 01/27/22 2356 01/28/22 0528  BP: 133/71 119/82 107/69 (!) 102/55  Pulse: 71 81 71 (!) 48  Resp:  '16 16 14  '$ Temp: 98.1 F (36.7 C) 98.1 F (36.7 C) 97.8 F (36.6 C) 97.8 F (36.6 C)  TempSrc: Oral Oral Oral   SpO2: 98% 99% 98% 97%  Weight:      Height:        Intake/Output Summary  (Last 24 hours) at 01/28/2022 0530 Last data filed at 01/27/2022 2100 Gross per 24 hour  Intake --  Output 1300 ml  Net -1300 ml      01/27/2022    4:50 AM 01/26/2022    4:40 AM 01/16/2022    9:35 AM  Last 3 Weights  Weight (lbs) 115 lb 14.4 oz 119 lb 11.4 oz 140 lb 3.4 oz  Weight (kg) 52.572 kg 54.3 kg 63.6 kg     Body mass index is 16.16 kg/m.    General: Very thin.  Elderly.  Frail. HEENT: normal Neck: no JVD Vascular: No carotid bruits; Distal pulses 2+ bilaterally Cardiac:  normal S1, S2; RRR; no murmur.  Tissue thin over his ICD.  Incision intact.  Pocket is not painful. Lungs:  clear to auscultation bilaterally, no wheezing, rhonchi or rales  Abd: soft, nontender, no hepatomegaly  Ext: no edema Musculoskeletal:  No deformities, BUE and BLE strength normal and equal Skin: warm and dry  Neuro:  CNs 2-12 intact, no focal abnormalities noted Psych:  Normal affect   EKG:  The EKG was personally reviewed and demonstrates: Sinus rhythm with a PAC.  Narrow QRS.    Telemetry:  Telemetry was personally reviewed and demonstrates: Sinus rhythm     Relevant CV Studies:  January 26, 2022 remote interrogation personally reviewed ERI January 24, 2022 Leads were implanted September 13, 2012 Lead parameters are stable Programmed DDD 60-1 20 8.8% atrial pacing Less than 1% ventricular pacing 1 episode of ATP for VT since May 12, 2021.  This occurred on January 22 for a VT at 214 bpm Another tachycardia in the VF zone (235 bpm) was nonsustained on December 29, 2021 Intermittent atrial lead noise noted on EGM's.  I do not see any noise on the ventricular lead.      Laboratory Data:  High Sensitivity Troponin:   Recent Labs  Lab 01/25/22 2335 01/26/22 0223  TROPONINIHS 17 20*     Chemistry Recent Labs  Lab 01/25/22 1349 01/26/22 0513 01/27/22 0605  NA 143 144 143  K 4.1 3.7 3.8  CL 111 112* 111  CO2 '26 27 26  '$ GLUCOSE 153* 97 108*  BUN 34* 33* 35*  CREATININE 1.17  1.14 1.14  CALCIUM 9.4 8.8* 9.0  GFRNONAA >60 >60 >60  ANIONGAP '6 5 6    '$ No results for input(s): "PROT", "ALBUMIN", "AST", "ALT", "ALKPHOS", "BILITOT" in the last 168 hours. Lipids No results for input(s): "CHOL", "TRIG", "HDL", "LABVLDL", "LDLCALC", "CHOLHDL" in the last 168 hours.  Hematology Recent Labs  Lab 01/25/22 1349 01/26/22 0513 01/27/22 0605  WBC 6.7 5.2 6.1  RBC 3.85* 3.62* 3.95*  HGB 11.9* 11.5* 12.1*  HCT 37.2* 35.2* 37.4*  MCV 96.6 97.2 94.7  MCH 30.9 31.8 30.6  MCHC 32.0 32.7 32.4  RDW 14.2 14.2 14.0  PLT 115* 108* 117*   Thyroid No results for input(s): "TSH", "FREET4" in the last 168 hours.  BNP Recent Labs  Lab 01/25/22 1349  BNP 389.1*    DDimer No results for input(s): "DDIMER" in the last 168 hours.   Radiology/Studies:  CUP PACEART REMOTE DEVICE CHECK  Result Date: 01/26/2022 MOD:  Pt. currently hospitalized Scheduled remote reviewed. Device reached ERI 8/19 Last device check 12/24/2020 Events with successful ATPx1 1/22 and 7/24, EGM's show sustained VT Multiple AMS for PAT and brief atrial lead noise Next remote 91 days. Route to triage for awareness LA  CT HEAD WO CONTRAST (5MM)  Result Date: 01/25/2022 CLINICAL DATA:  Dizziness, weakness, fall EXAM: CT HEAD WITHOUT CONTRAST TECHNIQUE: Contiguous axial images were obtained from the base of the skull through the vertex without intravenous contrast. RADIATION DOSE REDUCTION: This exam was performed according to the departmental dose-optimization program which includes automated exposure control, adjustment of the mA and/or kV according to patient size and/or use of iterative reconstruction technique. COMPARISON:  07/18/2021 FINDINGS: Brain: No acute intracranial findings are seen. There are no signs of bleeding within the cranium. Ventricles are nondilated. Cortical sulci are prominent. There is decreased density in periventricular white matter. Vascular: Unremarkable. Skull: Unremarkable.  Sinuses/Orbits: Unremarkable. Other: None. IMPRESSION: No acute intracranial findings are seen. Atrophy. Small vessel disease. Electronically Signed   By: Elmer Picker M.D.   On: 01/25/2022 19:33   DG Chest 2 View  Result Date: 01/25/2022 CLINICAL DATA:  Dizziness, weakness, cardiac arrhythmia EXAM: CHEST - 2 VIEW COMPARISON:  07/29/2021 FINDINGS: Transverse diameter of heart is increased. There is previous coronary bypass surgery. Pacemaker/defibrillator battery is seen in left infraclavicular region. There are no signs of pulmonary edema or focal pulmonary consolidation. Small transverse linear density in the right lower lung field may suggest scarring. Low position of the diaphragms suggests COPD. There is minimal blunting of left lateral CP angle. There is no pneumothorax. IMPRESSION: Cardiomegaly. There are no signs of pulmonary edema or focal pulmonary consolidation. Blunting of left lateral CP angle may suggest minimal effusion or pleural thickening. Electronically Signed   By: Elmer Picker M.D.   On: 01/25/2022 19:30     Assessment and Plan:   #ICD at [facility] The patient has a dual-chamber, single coil Abbott ICD that reached [facility] January 24, 2022.  He has previously received appropriate therapy for VT/VF.  Device is functioning appropriately.  He rarely atrially paces.  I had a long discussion with the patient at  the bedside today about his situation.  We discussed what ICD therapy means.  We discussed the risks of generator replacement.  We discussed how his age, body weight and general nutritional status increase the risk of infection at the time of generator replacement.  We discussed a possible strategy of not replacing the battery given his age and treatment goals and DNR status.  He has an underlying rhythm although he does intermittently pace in the atrium while programmed with a base rate of 60.  Today he tells me that he has a lot to think about given his health.  He is also  pending a move into a skilled nursing facility.  He would like some time to think about his options which I think is very reasonable.  We have several months left on the battery despite his ERI status.  He has a follow-up appointment scheduled with [name], [name] on February 03, 2022.  I have encouraged him to keep that appointment.  At that appointment we can finalize the plan.  Should he elect to proceed with generator replacement, okay to schedule this in [place].  I did discuss the generator replacement procedure in detail including the risks today. Risks, benefits, and alternatives to ICD pulse generator replacement were discussed in detail today.  The patient understands that risks include but are not limited to bleeding, infection, pneumothorax, perforation, tamponade, vascular damage, renal failure, MI, stroke, death, inappropriate shocks, damage to his existing leads, and lead dislodgement.        For questions or updates, please contact [place] Please consult [email or web address] for contact info under    Signed, Vickie Epley, MD  01/28/2022 5:30 AM

## 2022-01-29 DIAGNOSIS — D696 Thrombocytopenia, unspecified: Secondary | ICD-10-CM

## 2022-01-29 DIAGNOSIS — R627 Adult failure to thrive: Secondary | ICD-10-CM | POA: Diagnosis not present

## 2022-01-29 DIAGNOSIS — I5023 Acute on chronic systolic (congestive) heart failure: Secondary | ICD-10-CM | POA: Diagnosis not present

## 2022-01-29 LAB — BASIC METABOLIC PANEL
Anion gap: 5 (ref 5–15)
BUN: 41 mg/dL — ABNORMAL HIGH (ref 8–23)
CO2: 26 mmol/L (ref 22–32)
Calcium: 9.1 mg/dL (ref 8.9–10.3)
Chloride: 112 mmol/L — ABNORMAL HIGH (ref 98–111)
Creatinine, Ser: 1.09 mg/dL (ref 0.61–1.24)
GFR, Estimated: >60 mL/min (ref >60)
Glucose, Bld: 102 mg/dL — ABNORMAL HIGH (ref 70–99)
Potassium: 3.8 mmol/L (ref 3.5–5.1)
Sodium: 143 mmol/L (ref 135–145)

## 2022-01-29 LAB — MAGNESIUM: Magnesium: 2.3 mg/dL (ref 1.7–2.4)

## 2022-01-29 NOTE — Progress Notes (Signed)
Nutrition Follow-up  DOCUMENTATION CODES:   Underweight, Severe malnutrition in context of chronic illness  INTERVENTION:   -Continue liberalized diet of regular -Continue MVI with minerals daily -Continue Ensure Enlive po TID, each supplement provides 350 kcal and 20 grams of protein  NUTRITION DIAGNOSIS:   Severe Malnutrition related to chronic illness (CHF) as evidenced by severe fat depletion, severe muscle depletion, percent weight loss.  Ongoing  GOAL:   Patient will meet greater than or equal to 90% of their needs  Progressing   MONITOR:   PO intake, Supplement acceptance  REASON FOR ASSESSMENT:   Consult Assessment of nutrition requirement/status  ASSESSMENT:   Pt with medical history significant for history of coronary disease status post CABG in 1996, STEMI in 2013, ischemic cardiomyopathy, end-stage combined diastolic and systolic CHF with last documented LVEF of 20 to 25%, status post AICD in 2014, hypertension PAF on anti-coagulation and hyperlipidemia.  Reviewed I/O's: -742 ml x 24 hours and -4 L since admission  UOP: 1.3 L x 24 hours   Pt receiving nursing care at time of visit.   Pt remains on a regular diet. Intake has improved since admission; noted meal completions 50-100%. Pt is also consuming Ensure supplements.   Wt has been stable since admission.   Per TOC notes, pt is medically stable for discharge and awaiting ALF placement with hospice services.   Medications reviewed and include vitamin C, vitamin D3, ferrous sulfate, melatonin, senokot, and zinc sulfate.   Labs reviewed: Phos: 1.5.  Diet Order:   Diet Order             Diet - low sodium heart healthy           Diet regular Room service appropriate? Yes; Fluid consistency: Thin  Diet effective now                   EDUCATION NEEDS:   Education needs have been addressed  Skin:  Skin Assessment: Reviewed RN Assessment  Last BM:  01/25/22  Height:   Ht Readings from  Last 1 Encounters:  01/26/22 '5\' 11"'$  (1.803 m)    Weight:   Wt Readings from Last 1 Encounters:  01/29/22 53.8 kg    Ideal Body Weight:  78.2 kg  BMI:  Body mass index is 16.54 kg/m.  Estimated Nutritional Needs:   Kcal:  1700-1900  Protein:  90-105 grams  Fluid:  > 1.7 L    Loistine Chance, RD, LDN, [place] Registered Dietitian II Certified Diabetes Care and Education Specialist Please refer to [facility] for RD and/or RD on-call/weekend/after hours pager

## 2022-01-29 NOTE — Progress Notes (Signed)
   01/29/22 0456  Vitals  Temp 98.2 F (36.8 C)  BP 119/73  MAP (mmHg) 85  BP Location Left Arm  BP Method Automatic  Patient Position (if appropriate) Lying  Pulse Rate (!) 107  Pulse Rate Source Monitor  Resp 20  MEWS COLOR  MEWS Score Color Green  Oxygen Therapy  SpO2 97 %  O2 Device Room Air  O2 Flow Rate (L/min) 0 L/min  MEWS Score  MEWS Temp 0  MEWS Systolic 0  MEWS Pulse 1  MEWS RR 0  MEWS LOC 0  MEWS Score 1   Patient had a 12 beat run of SVT. The patient is asymptomatic at this time. [place] NP notified.

## 2022-01-29 NOTE — Progress Notes (Signed)
Physical Therapy Treatment Patient Details Name: Louis Reid MRN: [id number] DOB: [birth year] Today's Date: 01/29/2022   History of Present Illness The patient is an 88 yo male that presented to the ED for weakness and inability to care for himself. PMH of recent hospital admissions, coronary disease, heart failure ejection fraction 20%, paroxysmal atrial fibrillation, chronic kidney disease stage IIIb, BPH, hypertension, dyslipidemia, PE, was on hospice but transitioned to palliative, MI, AICD, Covid 2/23.    PT Comments    Pt received upright in bed agreeable to PT services. Care management and student RN's in room. Pt remains mod-I with bed mobility and supervision with transfers and ambulation with RW. Pt progressing in tolerance for activity totaling ~10-15 minutes of standing tolerance with 2x42' bouts of gait with standing ADL task completion at sink washing hands and face without UE support for static standing balance challenge.  Pt completing with supervision without evidence of imbalance and intermittent SUE support on counter. Pt still requires VC's for RW proximity for optimized safe use of AD but otherwise tolerating session well with minor fatigue in LE's once placed in recliner. All needs in reach with d/c recs still pending on possible ALF versus LTC placement. Pt would benefit from some form of PT intervention at next venue of care to optimize independence, reduce falls risk, and reduce caregiver burden. TOC remains following and assisting with disposition.    Recommendations for follow up therapy are one component of a multi-disciplinary discharge planning process, led by the attending physician.  Recommendations may be updated based on patient status, additional functional criteria and insurance authorization.  Follow Up Recommendations  Other (comment) (LTC versus ALF versus SNF)     Assistance Recommended at Discharge Intermittent Supervision/Assistance  Patient can return  home with the following Assistance with cooking/housework;Assist for transportation;Help with stairs or ramp for entrance;Direct supervision/assist for medications management   Equipment Recommendations  Wheelchair cushion (measurements PT);Wheelchair (measurements PT)    Recommendations for Other Services       Precautions / Restrictions Precautions Precautions: Fall Restrictions Weight Bearing Restrictions: No     Mobility  Bed Mobility Overal bed mobility: Modified Independent               Patient Response: Cooperative  Transfers Overall transfer level: Needs assistance Equipment used: Rolling walker (2 wheels) Transfers: Sit to/from Stand Sit to Stand: Supervision                Ambulation/Gait Ambulation/Gait assistance: Supervision Gait Distance (Feet): 85 Feet Assistive device: Rolling walker (2 wheels) Gait Pattern/deviations: Step-through pattern, Decreased step length - right, Decreased step length - left       General Gait Details: VC's for upright posture and RW proximity   Stairs             Wheelchair Mobility    Modified Rankin (Stroke Patients Only)       Balance Overall balance assessment: Needs assistance Sitting-balance support: Feet supported Sitting balance-Leahy Scale: Good       Standing balance-Leahy Scale: Fair                              Cognition Arousal/Alertness: Awake/alert Behavior During Therapy: WFL for tasks assessed/performed Overall Cognitive Status: Within Functional Limits for tasks assessed  Exercises Other Exercises Other Exercises: standing tolerance at sink washing face for LE strengthening and static standing balance    General Comments General comments (skin integrity, edema, etc.): HR ranging from mid 70's to low 80's with activity      Pertinent Vitals/Pain Pain Assessment Pain Assessment: No/denies pain     Home Living                          Prior Function            PT Goals (current goals can now be found in the care plan section) Acute Rehab PT Goals Patient Stated Goal: to get the support/help that he needs at discharge PT Goal Formulation: With patient Time For Goal Achievement: 02/10/22 Potential to Achieve Goals: Fair Progress towards PT goals: Progressing toward goals    Frequency    Min 2X/week      PT Plan Current plan remains appropriate    Co-evaluation              AM-PAC PT "6 Clicks" Mobility   Outcome Measure  Help needed turning from your back to your side while in a flat bed without using bedrails?: None Help needed moving from lying on your back to sitting on the side of a flat bed without using bedrails?: None Help needed moving to and from a bed to a chair (including a wheelchair)?: None Help needed standing up from a chair using your arms (e.g., wheelchair or bedside chair)?: None Help needed to walk in hospital room?: A Little Help needed climbing 3-5 steps with a railing? : A Little 6 Click Score: 22    End of Session Equipment Utilized During Treatment: Gait belt Activity Tolerance: Patient tolerated treatment well Patient left: in chair;with call bell/phone within reach;with chair alarm set;with nursing/sitter in room Nurse Communication: Mobility status PT Visit Diagnosis: Other abnormalities of gait and mobility (R26.89);Difficulty in walking, not elsewhere classified (R26.2);Muscle weakness (generalized) (M62.81)     Time: 6568-1275 PT Time Calculation (min) (ACUTE ONLY): 26 min  Charges:  $Therapeutic Exercise: 23-37 mins                    Monterrius Cardosa M. Fairly IV, PT, DPT Physical Therapist- [facility]  01/29/2022, 11:06 AM

## 2022-01-29 NOTE — Progress Notes (Signed)
PROGRESS NOTE    Louis Reid  JGG:[id number] DOB: [birth year] DOA: 01/25/2022 PCP: [name], MD    Assessment & Plan:   Principal Problem:   Failure to thrive in adult Active Problems:   Paroxysmal atrial fibrillation [facility])   Essential hypertension   Coronary artery disease   History of coronary artery bypass surgery   Pulmonary embolism ([place])   ICD (implantable cardioverter-defibrillator) in place   CKD (chronic kidney disease) stage 2, GFR 60-89 ml/min   Chronic HFrEF (heart failure with reduced ejection fraction) ([place])   Stage 3b chronic kidney disease ([place])  Assessment and Plan:  Failure to thrive: main reason for coming to ED is severe dyspnea & inability to care for self    Acute on chronic combined CHF: continue on lasix. Monitor I/Os. Cardio following and recs apprec    CAD: s/p CABG. Continue on statin.    Hx of VT/VF: Has ICD that appears to be running out of battery & has f/u appt w/ cardio w/ [name], [name] on February 03, 2022.   Thrombocytopenia: etiology unclear. Labile    PAF: continue on eliquis. Rate controlled    BPH: continue on home dose of finasteride    Likely severe protein calorie malnutrition: continue on nutritional supplements      DVT prophylaxis: eliquis Code Status: full  Family Communication:  Disposition Plan: unclear as pt is refusing d/c to ALF in [place]. CM is aware and working on this   Level of care: Telemetry Medical  Status is: Inpatient Remains inpatient appropriate because: unclear as pt is refusing d/c to ALF in [place], [place]. CM is aware and working on this     Consultants:  Cardio   Procedures:   Antimicrobials:   Subjective: Pt c/o not wanting to go to ALF in [place], [place] as it too far away from his neighbors.   Objective: Vitals:   01/28/22 2349 01/29/22 0342 01/29/22 0456 01/29/22 0806  BP: 113/71 101/65 119/73 121/79  Pulse: 72 61 (!) 107 78  Resp: '18 18 20 17  '$ Temp: 98 F (36.7  C) 98.2 F (36.8 C) 98.2 F (36.8 C) 97.7 F (36.5 C)  TempSrc: Oral Oral  Oral  SpO2: 97% 98% 97% 99%  Weight:  53.8 kg    Height:        Intake/Output Summary (Last 24 hours) at 01/29/2022 0852 Last data filed at 01/29/2022 0806 Gross per 24 hour  Intake 583 ml  Output 1275 ml  Net -692 ml   Filed Weights   01/27/22 0450 01/28/22 0500 01/29/22 0342  Weight: 52.6 kg 54.1 kg 53.8 kg    Examination:  General exam: Appears calm and comfortable  Respiratory system: decreased breath sounds b/l  Cardiovascular system: S1 & S2+. No rubs, gallops or clicks.  Gastrointestinal system: Abdomen is nondistended, soft and nontender. Normal bowel sounds heard. Central nervous system: Alert and awake. Moves all extremities Psychiatry: Judgement and insight appears at baseline. Flat mood and affect     Data Reviewed: I have personally reviewed following labs and imaging studies  CBC: Recent Labs  Lab 01/25/22 1349 01/26/22 0513 01/27/22 0605  WBC 6.7 5.2 6.1  HGB 11.9* 11.5* 12.1*  HCT 37.2* 35.2* 37.4*  MCV 96.6 97.2 94.7  PLT 115* 108* 546*   Basic Metabolic Panel: Recent Labs  Lab 01/25/22 1349 01/26/22 0513 01/27/22 0605 01/28/22 0645 01/29/22 0550  NA 143 144 143 144 143  K 4.1 3.7 3.8 3.8 3.8  CL 111 112* 111 112* 112*  CO2 '26 27 26 27 26  '$ GLUCOSE 153* 97 108* 101* 102*  BUN 34* 33* 35* 40* 41*  CREATININE 1.17 1.14 1.14 1.16 1.09  CALCIUM 9.4 8.8* 9.0 9.2 9.1  MG  --   --   --   --  2.3   GFR: Estimated Creatinine Clearance: 36.3 mL/min (by C-G formula based on SCr of 1.09 mg/dL). Liver Function Tests: No results for input(s): "AST", "ALT", "ALKPHOS", "BILITOT", "PROT", "ALBUMIN" in the last 168 hours. No results for input(s): "LIPASE", "AMYLASE" in the last 168 hours. No results for input(s): "AMMONIA" in the last 168 hours. Coagulation Profile: No results for input(s): "INR", "PROTIME" in the last 168 hours. Cardiac Enzymes: No results for input(s):  "CKTOTAL", "CKMB", "CKMBINDEX", "TROPONINI" in the last 168 hours. BNP (last 3 results) No results for input(s): "PROBNP" in the last 8760 hours. HbA1C: No results for input(s): "HGBA1C" in the last 72 hours. CBG: No results for input(s): "GLUCAP" in the last 168 hours. Lipid Profile: No results for input(s): "CHOL", "HDL", "LDLCALC", "TRIG", "CHOLHDL", "LDLDIRECT" in the last 72 hours. Thyroid Function Tests: No results for input(s): "TSH", "T4TOTAL", "FREET4", "T3FREE", "THYROIDAB" in the last 72 hours. Anemia Panel: No results for input(s): "VITAMINB12", "FOLATE", "FERRITIN", "TIBC", "IRON", "RETICCTPCT" in the last 72 hours. Sepsis Labs: No results for input(s): "PROCALCITON", "LATICACIDVEN" in the last 168 hours.  Recent Results (from the past 240 hour(s))  SARS Coronavirus 2 by RT PCR (hospital order, performed in [facility] hospital lab) *cepheid single result test* Anterior Nasal Swab     Status: None   Collection Time: 01/25/22  7:45 PM   Specimen: Anterior Nasal Swab  Result Value Ref Range Status   SARS Coronavirus 2 by RT PCR NEGATIVE NEGATIVE Final    Comment: (NOTE) SARS-CoV-2 target nucleic acids are NOT DETECTED.  The SARS-CoV-2 RNA is generally detectable in upper and lower respiratory specimens during the acute phase of infection. The lowest concentration of SARS-CoV-2 viral copies this assay can detect is 250 copies / mL. A negative result does not preclude SARS-CoV-2 infection and should not be used as the sole basis for treatment or other patient management decisions.  A negative result may occur with improper specimen collection / handling, submission of specimen other than nasopharyngeal swab, presence of viral mutation(s) within the areas targeted by this assay, and inadequate number of viral copies (<250 copies / mL). A negative result must be combined with clinical observations, patient history, and epidemiological information.  Fact Sheet for Patients:    https://www.patel.info/  Fact Sheet for Healthcare Providers: https://hall.com/  This test is not yet approved or  cleared by the [place] FDA and has been authorized for detection and/or diagnosis of SARS-CoV-2 by FDA under an Emergency Use Authorization (EUA).  This EUA will remain in effect (meaning this test can be used) for the duration of the COVID-19 declaration under Section 564(b)(1) of the Act, 21 U.S.C. section 360bbb-3(b)(1), unless the authorization is terminated or revoked sooner.  Performed at [facility], [address] [place], [place] [address]          Radiology Studies: No results found.      Scheduled Meds:  apixaban  2.5 mg Oral BID   ascorbic acid  500 mg Oral Daily   cholecalciferol  2,000 Units Oral Daily   feeding supplement  237 mL Oral TID BM   ferrous sulfate  325 mg Oral Q breakfast  finasteride  5 mg Oral Daily   furosemide  20 mg Oral Daily   melatonin  5 mg Oral QHS   multivitamin with minerals  1 tablet Oral Daily   rosuvastatin  20 mg Oral Daily   senna-docusate  1 tablet Oral BID   simethicone  40 mg Oral Once   sodium chloride flush  3 mL Intravenous Q12H   zinc sulfate  220 mg Oral Daily   Continuous Infusions:  sodium chloride       LOS: 4 days    Time spent: 25 mins     Wyvonnia Dusky, MD [facility] Pager [phone]  If 7PM-7AM, please contact night-coverage [email or web address] 01/29/2022, 8:52 AM

## 2022-01-29 NOTE — Progress Notes (Signed)
Pt had facial hair shaved with electric trimmer. Tolerated well, no bleeding.

## 2022-01-29 NOTE — TOC Progression Note (Signed)
Transition of Care [facility]) - Progression Note    Patient Details  Name: Louis Reid MRN: [id number] Date of Birth: 1932/12/11  Transition [facility]) CM/SW [place], LCSW Phone Number: 01/29/2022, 4:42 PM  Clinical Narrative:   [employer] representative found [facility] in [place]. Rep and son are going to facility tomorrow at 10:30 to finalize everything. They may be able to accept him as soon as Saturday. [facility] and MD have been updated.  Expected Discharge Plan:  (TBD) Barriers to Discharge: Continued Medical Work up  Expected Discharge Plan and Services Expected Discharge Plan:  (TBD)     Post Acute Care Choice:  (TBD) Living arrangements for the past 2 months: Single Family Home Expected Discharge Date: 01/28/22                                     Social Determinants of Health (SDOH) Interventions    Readmission Risk Interventions     No data to display

## 2022-01-29 NOTE — Progress Notes (Signed)
Pt up with PT. Tolerated walking in the hallway. Pt gave partial bath tolerated well.

## 2022-01-30 ENCOUNTER — Encounter: Payer: Self-pay | Admitting: Internal Medicine

## 2022-01-30 DIAGNOSIS — I5023 Acute on chronic systolic (congestive) heart failure: Secondary | ICD-10-CM | POA: Diagnosis not present

## 2022-01-30 DIAGNOSIS — R627 Adult failure to thrive: Secondary | ICD-10-CM | POA: Diagnosis not present

## 2022-01-30 DIAGNOSIS — D696 Thrombocytopenia, unspecified: Secondary | ICD-10-CM | POA: Diagnosis not present

## 2022-01-30 LAB — CBC
HCT: 35.2 % — ABNORMAL LOW (ref 39.0–52.0)
Hemoglobin: 11.3 g/dL — ABNORMAL LOW (ref 13.0–17.0)
MCH: 30.4 pg (ref 26.0–34.0)
MCHC: 32.1 g/dL (ref 30.0–36.0)
MCV: 94.6 fL (ref 80.0–100.0)
Platelets: 122 10*3/uL — ABNORMAL LOW (ref 150–400)
RBC: 3.72 MIL/uL — ABNORMAL LOW (ref 4.22–5.81)
RDW: 14.3 % (ref 11.5–15.5)
WBC: 6 10*3/uL (ref 4.0–10.5)
nRBC: 0 % (ref 0.0–0.2)

## 2022-01-30 LAB — BASIC METABOLIC PANEL
Anion gap: 5 (ref 5–15)
BUN: 46 mg/dL — ABNORMAL HIGH (ref 8–23)
CO2: 28 mmol/L (ref 22–32)
Calcium: 9.1 mg/dL (ref 8.9–10.3)
Chloride: 108 mmol/L (ref 98–111)
Creatinine, Ser: 1.16 mg/dL (ref 0.61–1.24)
GFR, Estimated: >60 mL/min (ref >60)
Glucose, Bld: 118 mg/dL — ABNORMAL HIGH (ref 70–99)
Potassium: 3.7 mmol/L (ref 3.5–5.1)
Sodium: 141 mmol/L (ref 135–145)

## 2022-01-30 MED ORDER — ALPRAZOLAM 0.25 MG PO TABS
0.2500 mg | ORAL_TABLET | Freq: Once | ORAL | Status: AC
  Administered 2022-01-30: 0.25 mg via ORAL
  Filled 2022-01-30: qty 1

## 2022-01-30 NOTE — Progress Notes (Signed)
   01/30/22 1600  Clinical Encounter Type  Visited With Patient  Visit Type Initial  Referral From Nurse  Consult/Referral To Chaplain   Chaplain responded to nurse consult. Patient was resting. Chaplain will follow up again on evening shift.

## 2022-01-30 NOTE — Progress Notes (Signed)
Mobility Specialist - Progress Note   01/30/22 1000  Mobility  Activity Ambulated with assistance in hallway;Transferred from bed to chair  Level of Assistance Standby assist, set-up cues, supervision of patient - no hands on  Assistive Device Front wheel walker  Distance Ambulated (ft) 70 ft  Activity Response Tolerated well  $Mobility charge 1 Mobility     Pt lying in bed upon arrival, utilizing RA. Pt expresses feeling really tense this date, says he believes he's going into a depressive mental state---active listening and engagement provided. MD entered mid-session. Pt agreeable to activity and was able to come EOB modI. Ambulated in hallway with supervision and no complaints. Pt returned to recliner with alarm set, needs in reach.    Kathee Delton Mobility Specialist 01/30/22, 11:21 AM

## 2022-01-30 NOTE — Progress Notes (Signed)
Occupational Therapy Treatment Patient Details Name: Louis Reid MRN: [id number] DOB: 04/25/1933 Today's Date: 01/30/2022   History of present illness The patient is an 88 yo male that presented to the ED for weakness and inability to care for himself. PMH of recent hospital admissions, coronary disease, heart failure ejection fraction 20%, paroxysmal atrial fibrillation, chronic kidney disease stage IIIb, BPH, hypertension, dyslipidemia, PE, was on hospice but transitioned to palliative, MI, AICD, Covid 2/23.   OT comments  Pt seen for OT tx. Pt in recliner, endorses not sleeping well and concerns over mental well-being. Emotional support and active listening provided. Pt agreeable to work with therapist, perseverating throughout session on mental health. Encouragement offered and offered chaplain services which pt expressed interest in. RN notified. Pt tolerated ambulating in room and into the hall with CGA and PRN assist for RW mgt/positioning to maximize safety/balance. Ambulated total of ~20'+50'+50'+20' with brief standing rest breaks. Pt continues to benefit from skilled OT services to maximize safety and independence with ADL, mobility, and qualify of life.    Recommendations for follow up therapy are one component of a multi-disciplinary discharge planning process, led by the attending physician.  Recommendations may be updated based on patient status, additional functional criteria and insurance authorization.    Follow Up Recommendations  Other (comment) (Recommendation at this time is a transition to higher levels of care (LTC, ALF, PCA for in home) with follow up therapies to improve function as able.)    Assistance Recommended at Discharge Frequent or constant Supervision/Assistance  Patient can return home with the following  A little help with walking and/or transfers;A little help with bathing/dressing/bathroom;Assistance with cooking/housework;Assist for transportation;Help with  stairs or ramp for entrance   Equipment Recommendations  None recommended by OT    Recommendations for Other Services      Precautions / Restrictions Precautions Precautions: Fall Restrictions Weight Bearing Restrictions: No       Mobility Bed Mobility               General bed mobility comments: NT, up in recliner    Transfers Overall transfer level: Needs assistance Equipment used: Rolling walker (2 wheels) Transfers: Sit to/from Stand Sit to Stand: Min guard           General transfer comment: VC for anterior weight shift to minimize posterior LOB     Balance Overall balance assessment: Needs assistance Sitting-balance support: Feet supported Sitting balance-Leahy Scale: Good     Standing balance support: Bilateral upper extremity supported Standing balance-Leahy Scale: Fair                             ADL either performed or assessed with clinical judgement   ADL                                              Extremity/Trunk Assessment              Vision       Perception     Praxis      Cognition Arousal/Alertness: Awake/alert Behavior During Therapy:  (mildly labile) Overall Cognitive Status: No family/caregiver present to determine baseline cognitive functioning  General Comments: Pt perseverating on things being in motion which is good but feeling depressed and concerned about this "mental thing" not going away. Mildly labile during session.RN notified.        Exercises Other Exercises Other Exercises: Pt tolerated ambulating in room and into the hall with CGA and PRN assist for RW mgt/positioning to maximize safety/balance. Ambultated total of ~20'+50'+50'+20' with brief standing rest breaks Other Exercises: Pt educated in role of chaplain for support, RN notified    Shoulder Instructions       General Comments      Pertinent Vitals/ Pain        Pain Assessment Pain Assessment: No/denies pain  Home Living                                          Prior Functioning/Environment              Frequency  Min 2X/week        Progress Toward Goals  OT Goals(current goals can now be found in the care plan section)  Progress towards OT goals: Progressing toward goals  Acute Rehab OT Goals Patient Stated Goal: go somewhere but not home by myself OT Goal Formulation: With patient Time For Goal Achievement: 02/10/22 Potential to Achieve Goals: Good  Plan Discharge plan remains appropriate;Frequency remains appropriate    Co-evaluation                 AM-PAC OT "6 Clicks" Daily Activity     Outcome Measure   Help from another person eating meals?: None Help from another person taking care of personal grooming?: None Help from another person toileting, which includes using toliet, bedpan, or urinal?: A Little Help from another person bathing (including washing, rinsing, drying)?: A Little Help from another person to put on and taking off regular upper body clothing?: None Help from another person to put on and taking off regular lower body clothing?: A Little 6 Click Score: 21    End of Session Equipment Utilized During Treatment: Rolling walker (2 wheels);Gait belt  OT Visit Diagnosis: Other abnormalities of gait and mobility (R26.89);Muscle weakness (generalized) (M62.81);History of falling (Z91.81)   Activity Tolerance Patient tolerated treatment well   Patient Left in chair;with call bell/phone within reach;with chair alarm set   Nurse Communication Mobility status;Other (comment) (chaplain consult)        Time: 9741-6384 OT Time Calculation (min): 18 min  Charges: OT General Charges $OT Visit: 1 Visit OT Treatments $Therapeutic Activity: 8-22 mins  Ardeth Perfect., MPH, MS, OTR/L ascom [phone] 01/30/22, 3:08 PM

## 2022-01-30 NOTE — Progress Notes (Signed)
PROGRESS NOTE    Louis Reid  KWI:[id number] DOB: May 11, 1933 DOA: 01/25/2022 PCP: [name], MD    Assessment & Plan:   Principal Problem:   Failure to thrive in adult Active Problems:   Paroxysmal atrial fibrillation [facility])   Essential hypertension   Coronary artery disease   History of coronary artery bypass surgery   Pulmonary embolism ([place])   ICD (implantable cardioverter-defibrillator) in place   CKD (chronic kidney disease) stage 2, GFR 60-89 ml/min   Chronic HFrEF (heart failure with reduced ejection fraction) ([place])   Stage 3b chronic kidney disease ([place])  Assessment and Plan:  Failure to thrive: inability to care for himself. Will d/c to [facility] and have hospice there    Acute on chronic combined CHF: continue on lasix. Monitor I/Os. Cardio following and recs apprec      CAD: s/p CABG. Continue on statin    Hx of VT/VF: Has ICD that appears to be running out of battery & has f/u appt w/ cardio w/ [name], [name] on February 03, 2022.   Thrombocytopenia: etiology unclear. Labile    PAF: rate controlled. Continue on eliquis    BPH: continue on home dose of finasteride    Likely severe protein calorie malnutrition: continue on nutritional supplements        DVT prophylaxis: eliquis Code Status: full  Family Communication:  Disposition Plan: will d/c on 02/01/22 to [facility] and will have hospice there   Level of care: Telemetry Medical  Status is: Inpatient Remains inpatient appropriate because: will d/c on 02/01/22 [facility] and will have hospice there    Consultants:  Cardio   Procedures:   Antimicrobials:   Subjective: Pt c/o anxiety.   Objective: Vitals:   01/29/22 2012 01/30/22 0028 01/30/22 0430 01/30/22 0809  BP: 116/79 109/74 98/68 102/88  Pulse: 78 77 73 75  Resp: '17 18 18 16  '$ Temp: (!) 97.5 F (36.4 C) 98 F (36.7 C) (!) 97.5 F (36.4 C) (!) 97.3 F (36.3 C)   TempSrc: Oral     SpO2: 99% 98% 97% 96%  Weight:   55.5 kg   Height:        Intake/Output Summary (Last 24 hours) at 01/30/2022 0818 Last data filed at 01/30/2022 0523 Gross per 24 hour  Intake 603 ml  Output 700 ml  Net -97 ml   Filed Weights   01/28/22 0500 01/29/22 0342 01/30/22 0430  Weight: 54.1 kg 53.8 kg 55.5 kg    Examination:  General exam: Appears anxious. Frail appearing  Respiratory system: diminished breath sounds b/l  Cardiovascular system: S1/S2+. No rubs or clicks   Gastrointestinal system: Abd is soft, NT, ND & hypoactive bowel sounds.  Central nervous system: Alert and awake. Moves all extremities  Psychiatry: Judgement and insight appears at baseline. Anxious mood and affect    Data Reviewed: I have personally reviewed following labs and imaging studies  CBC: Recent Labs  Lab 01/25/22 1349 01/26/22 0513 01/27/22 0605 01/30/22 0506  WBC 6.7 5.2 6.1 6.0  HGB 11.9* 11.5* 12.1* 11.3*  HCT 37.2* 35.2* 37.4* 35.2*  MCV 96.6 97.2 94.7 94.6  PLT 115* 108* 117* 242*   Basic Metabolic Panel: Recent Labs  Lab 01/26/22 0513 01/27/22 0605 01/28/22 0645 01/29/22 0550 01/30/22 0506  NA 144 143 144 143 141  K 3.7 3.8 3.8 3.8 3.7  CL 112* 111 112* 112* 108  CO2 '27 26 27 26 28  '$ GLUCOSE  97 108* 101* 102* 118*  BUN 33* 35* 40* 41* 46*  CREATININE 1.14 1.14 1.16 1.09 1.16  CALCIUM 8.8* 9.0 9.2 9.1 9.1  MG  --   --   --  2.3  --    GFR: Estimated Creatinine Clearance: 35.2 mL/min (by C-G formula based on SCr of 1.16 mg/dL). Liver Function Tests: No results for input(s): "AST", "ALT", "ALKPHOS", "BILITOT", "PROT", "ALBUMIN" in the last 168 hours. No results for input(s): "LIPASE", "AMYLASE" in the last 168 hours. No results for input(s): "AMMONIA" in the last 168 hours. Coagulation Profile: No results for input(s): "INR", "PROTIME" in the last 168 hours. Cardiac Enzymes: No results for input(s): "CKTOTAL", "CKMB", "CKMBINDEX", "TROPONINI" in the last  168 hours. BNP (last 3 results) No results for input(s): "PROBNP" in the last 8760 hours. HbA1C: No results for input(s): "HGBA1C" in the last 72 hours. CBG: No results for input(s): "GLUCAP" in the last 168 hours. Lipid Profile: No results for input(s): "CHOL", "HDL", "LDLCALC", "TRIG", "CHOLHDL", "LDLDIRECT" in the last 72 hours. Thyroid Function Tests: No results for input(s): "TSH", "T4TOTAL", "FREET4", "T3FREE", "THYROIDAB" in the last 72 hours. Anemia Panel: No results for input(s): "VITAMINB12", "FOLATE", "FERRITIN", "TIBC", "IRON", "RETICCTPCT" in the last 72 hours. Sepsis Labs: No results for input(s): "PROCALCITON", "LATICACIDVEN" in the last 168 hours.  Recent Results (from the past 240 hour(s))  SARS Coronavirus 2 by RT PCR (hospital order, performed in [facility] hospital lab) *cepheid single result test* Anterior Nasal Swab     Status: None   Collection Time: 01/25/22  7:45 PM   Specimen: Anterior Nasal Swab  Result Value Ref Range Status   SARS Coronavirus 2 by RT PCR NEGATIVE NEGATIVE Final    Comment: (NOTE) SARS-CoV-2 target nucleic acids are NOT DETECTED.  The SARS-CoV-2 RNA is generally detectable in upper and lower respiratory specimens during the acute phase of infection. The lowest concentration of SARS-CoV-2 viral copies this assay can detect is 250 copies / mL. A negative result does not preclude SARS-CoV-2 infection and should not be used as the sole basis for treatment or other patient management decisions.  A negative result may occur with improper specimen collection / handling, submission of specimen other than nasopharyngeal swab, presence of viral mutation(s) within the areas targeted by this assay, and inadequate number of viral copies (<250 copies / mL). A negative result must be combined with clinical observations, patient history, and epidemiological information.  Fact Sheet for Patients:   https://www.patel.info/  Fact  Sheet for Healthcare Providers: https://hall.com/  This test is not yet approved or  cleared by the [place] FDA and has been authorized for detection and/or diagnosis of SARS-CoV-2 by FDA under an Emergency Use Authorization (EUA).  This EUA will remain in effect (meaning this test can be used) for the duration of the COVID-19 declaration under Section 564(b)(1) of the Act, 21 U.S.C. section 360bbb-3(b)(1), unless the authorization is terminated or revoked sooner.  Performed at [facility], [address], [place], [place] [address]          Radiology Studies: No results found.      Scheduled Meds:  apixaban  2.5 mg Oral BID   ascorbic acid  500 mg Oral Daily   cholecalciferol  2,000 Units Oral Daily   feeding supplement  237 mL Oral TID BM   ferrous sulfate  325 mg Oral Q breakfast   finasteride  5 mg Oral Daily   furosemide  20 mg Oral Daily  melatonin  5 mg Oral QHS   multivitamin with minerals  1 tablet Oral Daily   rosuvastatin  20 mg Oral Daily   senna-docusate  1 tablet Oral BID   simethicone  40 mg Oral Once   sodium chloride flush  3 mL Intravenous Q12H   zinc sulfate  220 mg Oral Daily   Continuous Infusions:  sodium chloride       LOS: 5 days    Time spent: 25 mins     Wyvonnia Dusky, MD [facility] Pager [phone]  If 7PM-7AM, please contact night-coverage [email or web address] 01/30/2022, 8:18 AM

## 2022-01-30 NOTE — Progress Notes (Signed)
Manufacturing engineer [facility])  Referral received for hospice services at [place] once discharged.  Plans are to d/c on Sunday per ability of Group Home to accept him, and based on his sons request.   Plans tentatively arranged for transport via [place] (see [facility] note from 8.25) on Sunday after noon.  Discussed with Group Home administrator and DME ordered per her request to be delivered on Saturday.  At discharge, if needed, please arrange for comfort medications to be sent to [facility].  Please send completed DNR.  Thank you, Venia Carbon DNP, RN [facility] Liaison ([place] chat, or AMION under Hospice for daily coverage)

## 2022-01-30 NOTE — Progress Notes (Signed)
PT Cancellation Note  Patient Details Name: Louis Reid MRN: [id number] DOB: Jul 07, 1932   Cancelled Treatment:    Reason Eval/Treat Not Completed: Other (comment). Pt politely declining working with PT this PM, stated he has had a few walks today, and would like to rest. He also wanted the PT to inform RN about his mental status and (concerns of depression) as well as sleep, RN messaged via chat. PT to re-attempt as able.    Lieutenant Diego PT, DPT 3:58 PM,01/30/22

## 2022-01-30 NOTE — TOC Progression Note (Addendum)
Transition of Care [facility]) - Progression Note    Patient Details  Name: Louis Reid MRN: [id number] Date of Birth: 1933-01-05  Transition of Care [facility]) CM/SW [place], LCSW Phone Number: 01/30/2022, 1:54 PM  Clinical Narrative:  Son, facility, and [facility] requested discharge on Sunday. [facility] is ordering DME so that will give them time to have it delivered. Son requesting transport to the facility. Contacted [facility] (door-to-door service). Emailed them the transport details, requesting pickup around noon on Sunday. Asked him to confirm if they can transport or not once reviewed. Left voicemail for group home owner.   2:32 pm: Received call back from [place] at [facility]. She confirmed plan for Sunday. Will need prescriptions sent to [facility]. MD is aware. Faxed FL2 to Mia.  Expected Discharge Plan:  (TBD) Barriers to Discharge: Continued Medical Work up  Expected Discharge Plan and Services Expected Discharge Plan:  (TBD)     Post Acute Care Choice:  (TBD) Living arrangements for the past 2 months: Single Family Home Expected Discharge Date: 01/28/22                                     Social Determinants of Health (SDOH) Interventions    Readmission Risk Interventions     No data to display

## 2022-01-30 NOTE — NC FL2 (Signed)
[place] LEVEL OF CARE SCREENING TOOL     IDENTIFICATION  Patient Name: Louis Reid Birthdate: 1932/09/22 Sex: male Admission Date (Current Location): 01/25/2022  [place] and [place] Number:  Engineering geologist and Address:  [facility], [address] [place], [place] [address]      Provider Number: [id number]  Attending Physician Name and Address:  Wyvonnia Dusky, MD  Relative Name and Phone Number:       Current Level of Care: Hospital Recommended Level of Care: [place] (with hospice through [facility].) Prior Approval Number:    Date Approved/Denied:   PASRR Number:    Discharge Plan: Other (Comment) ([place] with hospice through [facility].)    Current Diagnoses: Patient Active Problem List   Diagnosis Date Noted   COVID-19 virus infection 07/19/2021   Decubital ulcer 04/04/2021   AKI (acute kidney injury) ([place]) 04/02/2021   Splenic infarct    Hepatic steatosis    Stage 3b chronic kidney disease ([place]) 03/11/2021   Unable to care for self 03/11/2021   Constipation 03/11/2021   Failure to thrive in adult 03/11/2021   Protein-calorie malnutrition, severe 02/22/2021   Generalized weakness    Palliative care encounter    Goals of care, counseling/discussion    Encounter for hospice care discussion    Heart failure ([place]) 02/28/2018   Decreased cardiac ejection fraction 01/10/2018   Nonspecific chest pain    Chronic HFrEF (heart failure with reduced ejection fraction) ([place])    Unstable angina ([place]):  (01/20/2017) a. 3-v CAD w/ patent SVG to diag and patent sequential SVG to OM 2 and OM 3. Patent prox LAD stent. LIMA to LAD and SVG to RCA known occl. stable prox RCA sten  01/19/2017   Paroxysmal atrial fibrillation (HCC) 03/17/2016   Claudication ([place]) 01/22/2014   CKD (chronic kidney disease) stage 2, GFR 60-89 ml/min 12/26/2013   ICD (implantable cardioverter-defibrillator) in place 02/16/2013    Postop check 07/05/2012   STEMI (ST elevation myocardial infarction) ([place]) 05/18/2012   Anemia in chronic kidney disease(285.21) 05/17/2012   Pulmonary embolism ([place]) 05/17/2012   Physical deconditioning 05/17/2012   Anorexia 05/17/2012   Cardiogenic shock ([place]) 05/16/2012   Arterial hypotension 05/16/2012   Ventricular fibrillation arrest 05/10/2012   Cardiac arrest ([place]) 05/10/2012   Acute respiratory failure with hypoxia 2/2 cardiac arrest 05/10/2012   Acute encephalopathy 2/2 cardiac arrest 05/10/2012   Hyperglycemia 05/10/2012   Odynophagia 05/07/2012   Coronary artery disease 07/09/2011   History of coronary artery bypass surgery 07/09/2011   DYSPNEA 06/04/2010   Hyperlipidemia, unspecified 04/17/2009   Essential hypertension 04/17/2009   Asymptomatic old MI (myocardial infarction) 04/17/2009   TOBACCO USE, QUIT 04/17/2009    Orientation RESPIRATION BLADDER Height & Weight     Self, Time, Situation, Place  Normal Continent Weight: 122 lb 5.7 oz (55.5 kg) Height:  '5\' 11"'$  (180.3 cm)  BEHAVIORAL SYMPTOMS/MOOD NEUROLOGICAL BOWEL NUTRITION STATUS   (None)  (None) Continent Diet (Regular)  AMBULATORY STATUS COMMUNICATION OF NEEDS Skin   Supervision Verbally Other (Comment) (Erythema/redness.)                       Personal Care Assistance Level of Assistance  Bathing, Feeding, Dressing Bathing Assistance: Limited assistance Feeding assistance: Independent Dressing Assistance: Limited assistance     Functional Limitations Info  Sight, Hearing, Speech Sight Info: Adequate Hearing Info: Adequate Speech Info: Adequate    SPECIAL CARE FACTORS FREQUENCY  Contractures Contractures Info: Not present    Additional Factors Info  Code Status, Allergies Code Status Info: DNR Allergies Info: NKDA           Current Medications (01/30/2022):  This is the current hospital active medication list Current Facility-Administered Medications   Medication Dose Route Frequency Provider Last Rate Last Admin   0.9 %  sodium chloride infusion  250 mL Intravenous PRN Acheampong, Warnell Bureau, MD       acetaminophen (TYLENOL) tablet 650 mg  650 mg Oral Q4H PRN Acheampong, Warnell Bureau, MD       apixaban Arne Cleveland) tablet 2.5 mg  2.5 mg Oral BID Gwynne Edinger, MD   2.5 mg at 01/30/22 4174   ascorbic acid (VITAMIN C) tablet 500 mg  500 mg Oral Daily Artist Beach, MD   500 mg at 01/30/22 0814   cholecalciferol (VITAMIN D3) 25 MCG (1000 UNIT) tablet 2,000 Units  2,000 Units Oral Daily Artist Beach, MD   2,000 Units at 01/30/22 4818   feeding supplement (ENSURE ENLIVE / ENSURE PLUS) liquid 237 mL  237 mL Oral TID BM Artist Beach, MD   237 mL at 01/30/22 1014   ferrous sulfate tablet 325 mg  325 mg Oral Q breakfast Artist Beach, MD   325 mg at 01/30/22 5631   finasteride (PROSCAR) tablet 5 mg  5 mg Oral Daily Acheampong, Warnell Bureau, MD   5 mg at 01/30/22 4970   furosemide (LASIX) tablet 20 mg  20 mg Oral Daily Theora Gianotti, NP   20 mg at 01/30/22 2637   melatonin tablet 5 mg  5 mg Oral QHS Athena Masse, MD   5 mg at 01/29/22 2043   multivitamin with minerals tablet 1 tablet  1 tablet Oral Daily Gwynne Edinger, MD   1 tablet at 01/30/22 0807   ondansetron (ZOFRAN) injection 4 mg  4 mg Intravenous Q6H PRN Acheampong, Warnell Bureau, MD       rosuvastatin (CRESTOR) tablet 20 mg  20 mg Oral Daily Theora Gianotti, NP   20 mg at 01/30/22 8588   senna-docusate (Senokot-S) tablet 1 tablet  1 tablet Oral BID Artist Beach, MD   1 tablet at 01/30/22 5027   simethicone (MYLICON) 40 XA/1.2IN suspension 40 mg  40 mg Oral Once Foust, Katy L, NP       sodium chloride flush (NS) 0.9 % injection 3 mL  3 mL Intravenous Q12H Acheampong, Warnell Bureau, MD   3 mL at 01/30/22 8676   sodium chloride flush (NS) 0.9 % injection 3 mL  3 mL Intravenous PRN Acheampong, Warnell Bureau, MD       zinc sulfate capsule 220 mg  220 mg Oral Daily  Acheampong, Warnell Bureau, MD   220 mg at 01/30/22 7209   zolpidem (AMBIEN) tablet 5 mg  5 mg Oral QHS PRN Athena Masse, MD   5 mg at 01/29/22 2228     Discharge Medications: STOP taking these medications     ascorbic acid 500 MG tablet Commonly known as: VITAMIN C           TAKE these medications     acetaminophen 500 MG tablet Commonly known as: TYLENOL Take 1 tablet (500 mg total) by mouth every 6 (six) hours as needed for mild pain (or Fever >/= 101).    apixaban 2.5 MG Tabs tablet Commonly known as: Eliquis Take 1 tablet (2.5 mg total) by mouth 2 (  two) times daily.    D3-1000 25 MCG (1000 UT) capsule Generic drug: Cholecalciferol Take 2,000 Units by mouth daily.    feeding supplement Liqd Take 237 mLs by mouth 3 (three) times daily between meals.    ferrous sulfate 325 (65 FE) MG tablet Take 325 mg by mouth daily with breakfast.    finasteride 5 MG tablet Commonly known as: PROSCAR Take 5 mg by mouth daily.    furosemide 20 MG tablet Commonly known as: LASIX Take 1 tablet (20 mg total) by mouth daily. Start taking on: January 29, 2022 What changed: how much to take    ICAPS AREDS 2 PO Take 1 capsule by mouth 2 (two) times daily.    rosuvastatin 20 MG tablet Commonly known as: CRESTOR Take 20 mg by mouth daily.    senna-docusate 8.6-50 MG tablet Commonly known as: Senokot-S Take 1 tablet by mouth 2 (two) times daily.    zinc sulfate 220 (50 Zn) MG capsule Take 220 mg by mouth daily.    Relevant Imaging Results:  Relevant Lab Results:   Additional Information SS#: [id number]  Candie Chroman, LCSW

## 2022-01-31 DIAGNOSIS — R627 Adult failure to thrive: Secondary | ICD-10-CM | POA: Diagnosis not present

## 2022-01-31 DIAGNOSIS — D696 Thrombocytopenia, unspecified: Secondary | ICD-10-CM | POA: Diagnosis not present

## 2022-01-31 DIAGNOSIS — I5023 Acute on chronic systolic (congestive) heart failure: Secondary | ICD-10-CM | POA: Diagnosis not present

## 2022-01-31 LAB — CBC
HCT: 35.5 % — ABNORMAL LOW (ref 39.0–52.0)
Hemoglobin: 11.3 g/dL — ABNORMAL LOW (ref 13.0–17.0)
MCH: 30.1 pg (ref 26.0–34.0)
MCHC: 31.8 g/dL (ref 30.0–36.0)
MCV: 94.7 fL (ref 80.0–100.0)
Platelets: 109 10*3/uL — ABNORMAL LOW (ref 150–400)
RBC: 3.75 MIL/uL — ABNORMAL LOW (ref 4.22–5.81)
RDW: 14.4 % (ref 11.5–15.5)
WBC: 5.4 10*3/uL (ref 4.0–10.5)
nRBC: 0 % (ref 0.0–0.2)

## 2022-01-31 LAB — BASIC METABOLIC PANEL
Anion gap: 3 — ABNORMAL LOW (ref 5–15)
BUN: 47 mg/dL — ABNORMAL HIGH (ref 8–23)
CO2: 26 mmol/L (ref 22–32)
Calcium: 9 mg/dL (ref 8.9–10.3)
Chloride: 112 mmol/L — ABNORMAL HIGH (ref 98–111)
Creatinine, Ser: 1.06 mg/dL (ref 0.61–1.24)
GFR, Estimated: >60 mL/min (ref >60)
Glucose, Bld: 120 mg/dL — ABNORMAL HIGH (ref 70–99)
Potassium: 3.8 mmol/L (ref 3.5–5.1)
Sodium: 141 mmol/L (ref 135–145)

## 2022-01-31 MED ORDER — ALPRAZOLAM 0.25 MG PO TABS
0.2500 mg | ORAL_TABLET | Freq: Every day | ORAL | Status: DC | PRN
  Administered 2022-01-31 – 2022-02-01 (×2): 0.25 mg via ORAL
  Filled 2022-01-31 (×2): qty 1

## 2022-01-31 NOTE — Progress Notes (Signed)
PROGRESS NOTE    Louis Reid  TML:[id number] DOB: May 10, 1933 DOA: 01/25/2022 PCP: [name], MD    Assessment & Plan:   Principal Problem:   Failure to thrive in adult Active Problems:   Paroxysmal atrial fibrillation [facility])   Essential hypertension   Coronary artery disease   History of coronary artery bypass surgery   Pulmonary embolism ([place])   ICD (implantable cardioverter-defibrillator) in place   CKD (chronic kidney disease) stage 2, GFR 60-89 ml/min   Chronic HFrEF (heart failure with reduced ejection fraction) ([place])   Stage 3b chronic kidney disease ([place])  Assessment and Plan:  Failure to thrive: inability to care for himself. Will d/c to [facility] and have hospice there    Acute on chronic combined CHF: continue on lasix. Monitor I/Os. Cardio following and recs apprec      CAD: s/p CABG. Continue on statin    Hx of VT/VF: Has ICD that appears to be running out of battery & has f/u appt w/ cardio w/ [name], [name] on February 03, 2022.   Thrombocytopenia: labile. Etiology unclear   PAF: rate controlled. Continue on eliquis   BPH: continue on home dose of finasteride   Likely severe protein calorie malnutrition: continue on nutritional supplements      DVT prophylaxis: eliquis Code Status: full  Family Communication:  Disposition Plan: will d/c on 02/01/22 to [facility] and will have hospice there   Level of care: Telemetry Medical  Status is: Inpatient Remains inpatient appropriate because: will d/[facility] on 02/01/22 [facility] and will have hospice there    Consultants:  Cardio   Procedures:   Antimicrobials:   Subjective: Pt c/o fatigue   Objective: Vitals:   01/30/22 2337 01/31/22 0425 01/31/22 0432 01/31/22 0751  BP: 104/63  108/68 112/64  Pulse: 76  63 67  Resp: '14  18 16  '$ Temp: 97.9 F (36.6 C)  98.3 F (36.8 C) 97.8 F (36.6 C)  TempSrc:      SpO2: 98%  96% 100%   Weight:  56.4 kg    Height:        Intake/Output Summary (Last 24 hours) at 01/31/2022 0757 Last data filed at 01/31/2022 0600 Gross per 24 hour  Intake 1319 ml  Output 1525 ml  Net -206 ml   Filed Weights   01/29/22 0342 01/30/22 0430 01/31/22 0425  Weight: 53.8 kg 55.5 kg 56.4 kg    Examination:  General exam: Appears calm & comfortable. Frail appearing  Respiratory system: decreased breath sounds b/l  Cardiovascular system: S1 & S2+. No rubs or gallops  Gastrointestinal system: Abd is soft, NT, ND & hypoactive bowel sounds Central nervous system: Alert and awake. Moves all extremities  Psychiatry: Judgement and insight appears at baseline. Flat mood and affect    Data Reviewed: I have personally reviewed following labs and imaging studies  CBC: Recent Labs  Lab 01/25/22 1349 01/26/22 0513 01/27/22 0605 01/30/22 0506 01/31/22 0507  WBC 6.7 5.2 6.1 6.0 5.4  HGB 11.9* 11.5* 12.1* 11.3* 11.3*  HCT 37.2* 35.2* 37.4* 35.2* 35.5*  MCV 96.6 97.2 94.7 94.6 94.7  PLT 115* 108* 117* 122* 681*   Basic Metabolic Panel: Recent Labs  Lab 01/27/22 0605 01/28/22 0645 01/29/22 0550 01/30/22 0506 01/31/22 0507  NA 143 144 143 141 141  K 3.8 3.8 3.8 3.7 3.8  CL 111 112* 112* 108 112*  CO2 '26 27 26 28 26  '$ GLUCOSE 108* 101*  102* 118* 120*  BUN 35* 40* 41* 46* 47*  CREATININE 1.14 1.16 1.09 1.16 1.06  CALCIUM 9.0 9.2 9.1 9.1 9.0  MG  --   --  2.3  --   --    GFR: Estimated Creatinine Clearance: 39.2 mL/min (by C-G formula based on SCr of 1.06 mg/dL). Liver Function Tests: No results for input(s): "AST", "ALT", "ALKPHOS", "BILITOT", "PROT", "ALBUMIN" in the last 168 hours. No results for input(s): "LIPASE", "AMYLASE" in the last 168 hours. No results for input(s): "AMMONIA" in the last 168 hours. Coagulation Profile: No results for input(s): "INR", "PROTIME" in the last 168 hours. Cardiac Enzymes: No results for input(s): "CKTOTAL", "CKMB", "CKMBINDEX", "TROPONINI" in  the last 168 hours. BNP (last 3 results) No results for input(s): "PROBNP" in the last 8760 hours. HbA1C: No results for input(s): "HGBA1C" in the last 72 hours. CBG: No results for input(s): "GLUCAP" in the last 168 hours. Lipid Profile: No results for input(s): "CHOL", "HDL", "LDLCALC", "TRIG", "CHOLHDL", "LDLDIRECT" in the last 72 hours. Thyroid Function Tests: No results for input(s): "TSH", "T4TOTAL", "FREET4", "T3FREE", "THYROIDAB" in the last 72 hours. Anemia Panel: No results for input(s): "VITAMINB12", "FOLATE", "FERRITIN", "TIBC", "IRON", "RETICCTPCT" in the last 72 hours. Sepsis Labs: No results for input(s): "PROCALCITON", "LATICACIDVEN" in the last 168 hours.  Recent Results (from the past 240 hour(s))  SARS Coronavirus 2 by RT PCR (hospital order, performed in [facility] hospital lab) *cepheid single result test* Anterior Nasal Swab     Status: None   Collection Time: 01/25/22  7:45 PM   Specimen: Anterior Nasal Swab  Result Value Ref Range Status   SARS Coronavirus 2 by RT PCR NEGATIVE NEGATIVE Final    Comment: (NOTE) SARS-CoV-2 target nucleic acids are NOT DETECTED.  The SARS-CoV-2 RNA is generally detectable in upper and lower respiratory specimens during the acute phase of infection. The lowest concentration of SARS-CoV-2 viral copies this assay can detect is 250 copies / mL. A negative result does not preclude SARS-CoV-2 infection and should not be used as the sole basis for treatment or other patient management decisions.  A negative result may occur with improper specimen collection / handling, submission of specimen other than nasopharyngeal swab, presence of viral mutation(s) within the areas targeted by this assay, and inadequate number of viral copies (<250 copies / mL). A negative result must be combined with clinical observations, patient history, and epidemiological information.  Fact Sheet for Patients:    https://www.patel.info/  Fact Sheet for Healthcare Providers: https://hall.com/  This test is not yet approved or  cleared by the [place] FDA and has been authorized for detection and/or diagnosis of SARS-CoV-2 by FDA under an Emergency Use Authorization (EUA).  This EUA will remain in effect (meaning this test can be used) for the duration of the COVID-19 declaration under Section 564(b)(1) of the Act, 21 U.S.C. section 360bbb-3(b)(1), unless the authorization is terminated or revoked sooner.  Performed at [facility], [address], [place], [place] [address]          Radiology Studies: No results found.      Scheduled Meds:  apixaban  2.5 mg Oral BID   ascorbic acid  500 mg Oral Daily   cholecalciferol  2,000 Units Oral Daily   feeding supplement  237 mL Oral TID BM   ferrous sulfate  325 mg Oral Q breakfast   finasteride  5 mg Oral Daily   furosemide  20 mg Oral Daily   melatonin  5 mg Oral QHS   multivitamin with minerals  1 tablet Oral Daily   rosuvastatin  20 mg Oral Daily   senna-docusate  1 tablet Oral BID   simethicone  40 mg Oral Once   sodium chloride flush  3 mL Intravenous Q12H   zinc sulfate  220 mg Oral Daily   Continuous Infusions:  sodium chloride       LOS: 6 days    Time spent: 25 mins     Wyvonnia Dusky, MD [facility] Pager [phone]  If 7PM-7AM, please contact night-coverage [email or web address] 01/31/2022, 7:57 AM

## 2022-01-31 NOTE — Progress Notes (Signed)
[facility]) Hospital Liaison Note  Plan still remains, patient will discharge tomorrow. Transportation arranged for noon with [employer].   Please send signed and completed DNR home with patient/family. Please provide prescriptions at discharge as needed to ensure ongoing symptom management.   Please call with any questions or concerns. Thank you for the opportunity to participate in this patient's care.   Bea Laura MSN RN [facility] Liaison [phone]

## 2022-02-01 LAB — CBC
HCT: 36.4 % — ABNORMAL LOW (ref 39.0–52.0)
Hemoglobin: 11.8 g/dL — ABNORMAL LOW (ref 13.0–17.0)
MCH: 31.1 pg (ref 26.0–34.0)
MCHC: 32.4 g/dL (ref 30.0–36.0)
MCV: 96 fL (ref 80.0–100.0)
Platelets: 116 10*3/uL — ABNORMAL LOW (ref 150–400)
RBC: 3.79 MIL/uL — ABNORMAL LOW (ref 4.22–5.81)
RDW: 14.1 % (ref 11.5–15.5)
WBC: 5.6 10*3/uL (ref 4.0–10.5)
nRBC: 0 % (ref 0.0–0.2)

## 2022-02-01 LAB — BASIC METABOLIC PANEL
Anion gap: 6 (ref 5–15)
BUN: 46 mg/dL — ABNORMAL HIGH (ref 8–23)
CO2: 25 mmol/L (ref 22–32)
Calcium: 9.2 mg/dL (ref 8.9–10.3)
Chloride: 112 mmol/L — ABNORMAL HIGH (ref 98–111)
Creatinine, Ser: 1.1 mg/dL (ref 0.61–1.24)
GFR, Estimated: >60 mL/min (ref >60)
Glucose, Bld: 93 mg/dL (ref 70–99)
Potassium: 3.9 mmol/L (ref 3.5–5.1)
Sodium: 143 mmol/L (ref 135–145)

## 2022-02-01 MED ORDER — ALPRAZOLAM 0.25 MG PO TABS: 0.2500 mg | ORAL_TABLET | Freq: Two times a day (BID) | ORAL | 0 refills | Status: AC | PRN

## 2022-02-01 MED ORDER — ALPRAZOLAM 0.25 MG PO TABS: 0.2500 mg | ORAL_TABLET | Freq: Two times a day (BID) | ORAL | 0 refills | Status: DC | PRN

## 2022-02-01 MED ORDER — OXYCODONE-ACETAMINOPHEN 5-325 MG PO TABS: 1.0000 | ORAL_TABLET | Freq: Four times a day (QID) | ORAL | 0 refills | Status: AC | PRN

## 2022-02-01 MED ORDER — OXYCODONE-ACETAMINOPHEN 5-325 MG PO TABS: 1.0000 | ORAL_TABLET | Freq: Four times a day (QID) | ORAL | 0 refills | Status: DC | PRN

## 2022-02-01 NOTE — Plan of Care (Signed)
Rested without distress this shift.  Reported difficulty sleeping and "feeling depressed and wanting to scream".  Emotional support offered and Ambien given after pill requested.  Slept without difficulty afterwards.

## 2022-02-01 NOTE — TOC Progression Note (Addendum)
Transition of Care [facility]) - Progression Note    Patient Details  Name: Louis Reid MRN: [id number] Date of Birth: 19-May-1933  Transition of [facility]) CM/SW Contact  Izola Price, RN Phone Number: 02/01/2022, 10:04 AM  Clinical Narrative:  8/27: [place] confirmed yesterday for noon pick up. Update Unit RN via secure chat on night shift (around 630 am and on day shift a few minutes ago). Contacted Mia  [phone]) at group home regarding [facility] is closed on Sundays for any new or comfort medications for discharge/hospice needs. She asked new meds be sent to [place], [place]. Phone [phone]. Routine meds already in their system from prior to admission will be via [employer]. Specifically, any pain medications and Xana ordered for comfort needs will need to [facility]. Updated provider and hospice liaison. FL2 confirmed as received by MIA. She requested DNR forme be signed and DC Summary faxed to [phone] when ready, which RN CM will complete. Routine EMS forms will be printed to unit and added to DNR forms and printed prescriptions in the AVS packet. Simmie Davies RN CM   Update: 1100 am. Fax remains busy. Contacted MIA at facility and given [place] email to send DC Summary to instead of fax. [email or web address] Attention MIA. All [facility] for [facility].   Follow up instructions in AVS:  DISCHARGE INSTRUCTIONS  Diet - low sodium heart healthy  Discharge instructions  F/u w/ PCP in 1-2 weeks.  [name], [name], [name], on February 03, 2022. Increase activity slowly  Simmie Davies RN CM   Expected Discharge Plan:  (TBD) Barriers to Discharge: Continued Medical Work up  Expected Discharge Plan and Services Expected Discharge Plan:  (TBD)     Post Acute Care Choice:  (TBD) Living arrangements for the past 2 months: Single Family  Home Expected Discharge Date: 02/01/22                                     Social Determinants of Health (SDOH) Interventions    Readmission Risk Interventions     No data to display

## 2022-02-03 ENCOUNTER — Other Ambulatory Visit: Payer: Medicare Other

## 2022-02-03 ENCOUNTER — Encounter: Payer: Self-pay | Admitting: Student

## 2022-02-03 ENCOUNTER — Ambulatory Visit: Payer: Medicare Other | Attending: Student | Admitting: Student

## 2022-02-03 VITALS — BP 120/76 | HR 87 | Ht 71.0 in | Wt 117.4 lb

## 2022-02-03 DIAGNOSIS — I5022 Chronic systolic (congestive) heart failure: Secondary | ICD-10-CM | POA: Diagnosis not present

## 2022-02-03 DIAGNOSIS — I480 Paroxysmal atrial fibrillation: Secondary | ICD-10-CM

## 2022-02-03 DIAGNOSIS — I2510 Atherosclerotic heart disease of native coronary artery without angina pectoris: Secondary | ICD-10-CM | POA: Diagnosis not present

## 2022-02-03 DIAGNOSIS — Z95810 Presence of automatic (implantable) cardiac defibrillator: Secondary | ICD-10-CM | POA: Diagnosis not present

## 2022-02-03 DIAGNOSIS — I255 Ischemic cardiomyopathy: Secondary | ICD-10-CM

## 2022-02-03 LAB — CUP PACEART INCLINIC DEVICE CHECK
Battery Remaining Longevity: 0 mo
Brady Statistic RA Percent Paced: 9 %
Brady Statistic RV Percent Paced: 0.75 %
Date Time Interrogation Session: 20230829100814
HighPow Impedance: 61.875
Implantable Lead Implant Date: 20140408
Implantable Lead Implant Date: 20140408
Implantable Lead Location: 753859
Implantable Lead Location: 753860
Implantable Pulse Generator Implant Date: 20140408
Lead Channel Impedance Value: 325 Ohm
Lead Channel Impedance Value: 337.5 Ohm
Lead Channel Sensing Intrinsic Amplitude: 10.5 mV
Lead Channel Sensing Intrinsic Amplitude: 2.9 mV
Lead Channel Setting Pacing Amplitude: 2 V
Lead Channel Setting Pacing Amplitude: 2 V
Lead Channel Setting Pacing Pulse Width: 0.5 ms
Lead Channel Setting Sensing Sensitivity: 0.5 mV
Pulse Gen Serial Number: 7094248

## 2022-02-03 MED ORDER — APIXABAN 2.5 MG PO TABS: 2.5000 mg | ORAL_TABLET | Freq: Two times a day (BID) | ORAL | 3 refills | Status: DC

## 2022-02-03 NOTE — Addendum Note (Signed)
Addended by: Carylon Perches on: 02/03/2022 12:02 PM   Modules accepted: Orders

## 2022-02-03 NOTE — Patient Instructions (Signed)
Medication Instructions:  Your physician recommends that you continue on your current medications as directed. Please refer to the Current Medication list given to you today.  *If you need a refill on your cardiac medications before your next appointment, please call your pharmacy*   Lab Work: None  If you have labs (blood work) drawn today and your tests are completely normal, you will receive your results only by: [place] (if you have MyChart) OR A paper copy in the mail If you have any lab test that is abnormal or we need to change your treatment, we will call you to review the results.   Follow-Up: At [facility], you and your health needs are our priority.  As part of our continuing mission to provide you with exceptional heart care, we have created designated Provider Care Teams.  These Care Teams include your primary Cardiologist (physician) and Advanced Practice Providers (APPs -  Physician Assistants and Nurse Practitioners) who all work together to provide you with the care you need, when you need it.  We recommend signing up for the patient portal called "MyChart".  Sign up information is provided on this After Visit Summary.  MyChart is used to connect with patients for Virtual Visits (Telemedicine).  Patients are able to view lab/test results, encounter notes, upcoming appointments, etc.  Non-urgent messages can be sent to your provider as well.   To learn more about what you can do with MyChart, go to [email or web address].    Your next appointment:   We will call you with procedure instructions when we have a date.

## 2022-02-13 ENCOUNTER — Emergency Department (HOSPITAL_COMMUNITY): Payer: Medicare Other

## 2022-02-13 ENCOUNTER — Encounter (HOSPITAL_COMMUNITY): Payer: Self-pay

## 2022-02-13 ENCOUNTER — Emergency Department (HOSPITAL_COMMUNITY)
Admission: EM | Admit: 2022-02-13 | Discharge: 2022-02-14 | Disposition: A | Discharge status: Home / Self Care | Payer: Medicare Other | Attending: Emergency Medicine | Admitting: Emergency Medicine

## 2022-02-13 DIAGNOSIS — R5383 Other fatigue: Secondary | ICD-10-CM | POA: Diagnosis present

## 2022-02-13 DIAGNOSIS — E860 Dehydration: Secondary | ICD-10-CM | POA: Diagnosis not present

## 2022-02-13 DIAGNOSIS — Z955 Presence of coronary angioplasty implant and graft: Secondary | ICD-10-CM | POA: Insufficient documentation

## 2022-02-13 DIAGNOSIS — Z7901 Long term (current) use of anticoagulants: Secondary | ICD-10-CM | POA: Insufficient documentation

## 2022-02-13 DIAGNOSIS — I4891 Unspecified atrial fibrillation: Secondary | ICD-10-CM | POA: Diagnosis not present

## 2022-02-13 DIAGNOSIS — I2510 Atherosclerotic heart disease of native coronary artery without angina pectoris: Secondary | ICD-10-CM | POA: Diagnosis not present

## 2022-02-13 DIAGNOSIS — R531 Weakness: Secondary | ICD-10-CM | POA: Diagnosis not present

## 2022-02-13 DIAGNOSIS — Z95810 Presence of automatic (implantable) cardiac defibrillator: Secondary | ICD-10-CM | POA: Insufficient documentation

## 2022-02-13 LAB — HEPATIC FUNCTION PANEL
ALT: 35 U/L (ref 0–44)
AST: 29 U/L (ref 15–41)
Albumin: 3.5 g/dL (ref 3.5–5.0)
Alkaline Phosphatase: 93 U/L (ref 38–126)
Bilirubin, Direct: 0.2 mg/dL (ref 0.0–0.2)
Indirect Bilirubin: 0.6 mg/dL (ref 0.3–0.9)
Total Bilirubin: 0.8 mg/dL (ref 0.3–1.2)
Total Protein: 6.2 g/dL — ABNORMAL LOW (ref 6.5–8.1)

## 2022-02-13 LAB — URINALYSIS, ROUTINE W REFLEX MICROSCOPIC
Bilirubin Urine: NEGATIVE
Glucose, UA: NEGATIVE mg/dL
Hgb urine dipstick: NEGATIVE
Ketones, ur: NEGATIVE mg/dL
Leukocytes,Ua: NEGATIVE
Nitrite: NEGATIVE
Protein, ur: NEGATIVE mg/dL
Specific Gravity, Urine: 1.01 (ref 1.005–1.030)
pH: 7 (ref 5.0–8.0)

## 2022-02-13 LAB — BASIC METABOLIC PANEL
Anion gap: 6 (ref 5–15)
BUN: 30 mg/dL — ABNORMAL HIGH (ref 8–23)
CO2: 25 mmol/L (ref 22–32)
Calcium: 8.8 mg/dL — ABNORMAL LOW (ref 8.9–10.3)
Chloride: 114 mmol/L — ABNORMAL HIGH (ref 98–111)
Creatinine, Ser: 1.29 mg/dL — ABNORMAL HIGH (ref 0.61–1.24)
GFR, Estimated: 54 mL/min — ABNORMAL LOW (ref >60)
Glucose, Bld: 96 mg/dL (ref 70–99)
Potassium: 3.6 mmol/L (ref 3.5–5.1)
Sodium: 145 mmol/L (ref 135–145)

## 2022-02-13 LAB — CBC
HCT: 40.7 % (ref 39.0–52.0)
Hemoglobin: 13.1 g/dL (ref 13.0–17.0)
MCH: 31.5 pg (ref 26.0–34.0)
MCHC: 32.2 g/dL (ref 30.0–36.0)
MCV: 97.8 fL (ref 80.0–100.0)
Platelets: 118 10*3/uL — ABNORMAL LOW (ref 150–400)
RBC: 4.16 MIL/uL — ABNORMAL LOW (ref 4.22–5.81)
RDW: 14.2 % (ref 11.5–15.5)
WBC: 6.1 10*3/uL (ref 4.0–10.5)
nRBC: 0 % (ref 0.0–0.2)

## 2022-02-13 LAB — MAGNESIUM: Magnesium: 2 mg/dL (ref 1.7–2.4)

## 2022-02-13 LAB — LACTIC ACID, PLASMA: Lactic Acid, Venous: 1.2 mmol/L (ref 0.5–1.9)

## 2022-02-13 LAB — TROPONIN I (HIGH SENSITIVITY): Troponin I (High Sensitivity): 19 ng/L — ABNORMAL HIGH (ref <18)

## 2022-02-13 MED ORDER — LACTATED RINGERS IV BOLUS
500.0000 mL | Freq: Once | INTRAVENOUS | Status: AC
  Administered 2022-02-13: 500 mL via INTRAVENOUS

## 2022-02-13 NOTE — ED Provider Notes (Signed)
[facility] DEPT Provider Note   CSN: [id number] Arrival date & time: 02/13/22  2016     History  Chief Complaint  Patient presents with   Failure To Thrive    STARR ENGEL is a 88 y.o. male.with pmh CAD s/p CABG, A-fib and PE on Eliquis, AICD placement, anemia, depression presenting with generalized fatigue and weakness  HPI     Home Medications Prior to Admission medications   Medication Sig Start Date End Date Taking? Authorizing Provider  acetaminophen (TYLENOL) 500 MG tablet Take 1 tablet (500 mg total) by mouth every 6 (six) hours as needed for mild pain (or Fever >/= 101). 03/15/21   Cherene Altes, MD  apixaban (ELIQUIS) 2.5 MG TABS tablet Take 1 tablet (2.5 mg total) by mouth 2 (two) times daily. 02/03/22   Shirley Friar, [name]  Cholecalciferol (D3-1000) 25 MCG (1000 UT) capsule Take 2,000 Units by mouth daily.    [provider]  feeding supplement (ENSURE ENLIVE / ENSURE PLUS) LIQD Take 237 mLs by mouth 3 (three) times daily between meals. 03/15/21   Cherene Altes, MD  ferrous sulfate 325 (65 FE) MG tablet Take 325 mg by mouth daily with breakfast.    [provider]  finasteride (PROSCAR) 5 MG tablet Take 5 mg by mouth daily. 08/30/19   [provider]  furosemide (LASIX) 20 MG tablet Take 1 tablet (20 mg total) by mouth daily. 01/29/22 02/28/22  Wyvonnia Dusky, MD  Multiple Vitamins-Minerals (ICAPS AREDS 2 PO) Take 1 capsule by mouth 2 (two) times daily.    [provider]  rosuvastatin (CRESTOR) 20 MG tablet Take 20 mg by mouth daily.    [provider]  senna-docusate (SENOKOT-S) 8.6-50 MG tablet Take 1 tablet by mouth 2 (two) times daily. 03/15/21   Cherene Altes, MD  zinc sulfate 220 (50 Zn) MG capsule Take 220 mg by mouth daily.    [provider]      Allergies    Patient has no known allergies.    Review of Systems   Review of Systems  Physical  Exam Updated Vital Signs BP 133/84 (BP Location: Right Arm)   Pulse 75   Temp 98.3 F (36.8 C) (Oral)   Resp 15   Ht '5\' 11"'$  (1.803 m)   Wt 53.3 kg   SpO2 100%   BMI 16.37 kg/m  Physical Exam  ED Results / Procedures / Treatments   Labs (all labs ordered are listed, but only abnormal results are displayed) Labs Reviewed  BASIC METABOLIC PANEL  CBC  URINALYSIS, ROUTINE W REFLEX MICROSCOPIC  LACTIC ACID, PLASMA  LACTIC ACID, PLASMA  TROPONIN I (HIGH SENSITIVITY)    EKG None  Radiology No results found.  Procedures Procedures  {Document cardiac monitor, telemetry assessment procedure when appropriate:1}  Medications Ordered in ED Medications - No data to display  ED Course/ Medical Decision Making/ A&P                           Medical Decision Making Last ICD check 02/03/22 without abnormal findings "ICD check in clinic. Normal device function. Thresholds and sensing consistent with previous device measurements. Impedance trends stable over time. No mode switches. No new ventricular arrhythmias. Histogram distribution appropriate for patient and  level of activity. No changes made this session. Device programmed at appropriate safety margins. Device programmed to optimize intrinsic conduction."  Amount and/or Complexity of  Data Reviewed Labs: ordered. Radiology: ordered.   ***  {Document critical care time when appropriate:1} {Document review of labs and clinical decision tools ie heart score, Chads2Vasc2 etc:1}  {Document your independent review of radiology images, and any outside records:1} {Document your discussion with family members, caretakers, and with consultants:1} {Document social determinants of health affecting pt's care:1} {Document your decision making why or why not admission, treatments were needed:1} Final Clinical Impression(s) / ED Diagnoses Final diagnoses:  None    Rx / DC Orders ED Discharge Orders     None

## 2022-02-13 NOTE — ED Triage Notes (Signed)
Pt from home BIB [facility], home health nurse concern for failure to thrive, d/t decrease PO intake. Pt reports weakness when up and ambulating. Home health aide reports no recent falls. He does not use his walker. VSS per EMS. Recent visit at [facility]. Hx of Afib, has defibrillator. Confused at baseline. Pt alert and following commands

## 2022-02-13 NOTE — Discharge Instructions (Signed)
You were seen for generalized fatigue and weakness.  You were mildly dehydrated and given some IV fluids in the ER.  Otherwise, your work-up was reassuring including EKG, labs and head imaging.  You are safe to go back to your home at this time.  Come back if any new severe worsening chest pain, uncontrollable nausea, vomiting, bloody diarrhea, or any other symptoms concerning to you.

## 2022-02-14 NOTE — ED Notes (Signed)
Called pt's son Hommer Cunliffe, spoke with him regarding transportation back home, he advised to call the [facility] coordinator Kennon Portela, [phone] however, no answer, received VM for the retirement community after hours. Spoke with [name] once more, he will try to contact Mrs. Suater and give a call back in reference if facility will transport pt back to his home or if EMS transport will need to be arrange.

## 2022-02-14 NOTE — ED Notes (Signed)
[facility] has arrived to transport pt back to [facility]

## 2022-02-14 NOTE — ED Notes (Signed)
Spoke with pt's son [name], he informed this RN that he had spoken with Kennon Portela and she informed Santina Evans that she or another co-worker  Aron Baba) will come pick up pt and take him back home to [employer]. Unsure of timeframe at this time. Update given to pt's son, pt resting and NAD noted, reviewed pt's AVS with [facility], all questions and concerns address. Charge RN Maylon Cos made aware of pt's status.

## 2022-02-14 NOTE — ED Notes (Signed)
[facility] called for transport arrangement back to [place], [place], [place], [place]

## 2022-02-14 NOTE — ED Notes (Signed)
Have tried several times to reach out to [facility] by phone [phone] to confirm pt returning back to [place], however, no answer. [name] has spoken with Mia and she is aware of pt's return back to the [facility] her or another member will be there to assist pt back home when EMS arrives with pt.

## 2022-02-14 NOTE — ED Notes (Addendum)
Pt's son [name] called back, reported he spoke with Mia again to get ETA and update, however there was some miscommunication. Mia and Aron Baba will not be available to come tonight, it will be late mid-morning. [name] requesting EMS transport back to the [facility] ([name] is out of town for work). Advised will call [facility] to get pt back to his home at the [facility]. Son reports if not cover by insurance, he advised EMS to send the bill and he would cost cover. Mia or Aron Baba will be there to great pt back home per [facility].   Also voiced to [name] that prior to return call, gave pt update, pt is adamant of not returning back to the retirement [facility], he feels the care is not adequate and delayed, feels he is not well taking care of there, wants to go to a nursing facility. [name] voiced he has looked into this when pt was at [facility] a few weeks ago, stated when he returns home he will assist pt with trying to find a SNF for pt that accomodates pt's needs, however in the meantime, [name] stated pt needed to go back to [facility] and he is to remain there until another facility can be arrange for pt.

## 2022-02-16 ENCOUNTER — Ambulatory Visit: Payer: Medicare Other | Admitting: Family

## 2022-02-22 NOTE — Progress Notes (Signed)
Remote ICD transmission.   

## 2022-02-24 ENCOUNTER — Telehealth: Payer: Self-pay

## 2022-02-24 NOTE — Telephone Encounter (Signed)
1121 Palliative Care Check In (attempt #1)    No answer. No voice mail.

## 2022-04-22 NOTE — Pre-Procedure Instructions (Signed)
Attempted to call patient regarding procedure instructions.  Unable to leave a voicemail, mailbox full

## 2022-04-23 ENCOUNTER — Ambulatory Visit (HOSPITAL_COMMUNITY): Admission: RE | Admit: 2022-04-23 | Payer: Medicare Other | Source: Home / Self Care | Admitting: Cardiology

## 2022-04-23 ENCOUNTER — Encounter (HOSPITAL_COMMUNITY): Admission: RE | Payer: Self-pay | Source: Home / Self Care

## 2022-04-23 ENCOUNTER — Telehealth: Payer: Self-pay | Admitting: Cardiology

## 2022-04-23 DIAGNOSIS — I480 Paroxysmal atrial fibrillation: Secondary | ICD-10-CM

## 2022-04-23 DIAGNOSIS — I5022 Chronic systolic (congestive) heart failure: Secondary | ICD-10-CM

## 2022-04-23 SURGERY — ICD GENERATOR CHANGEOUT

## 2022-04-23 NOTE — Telephone Encounter (Signed)
Pt's caregiver would like a callback as to what time pt needs to arrive today for Generator Changeout and if any prep is needed.

## 2022-04-23 NOTE — Telephone Encounter (Signed)
I have called the Caregiver twice and left a message. I am calling to reschedule his ICD Gen Change that was scheduled for today at 3:30pm. He did not hold his Eliquis x 2 days as instructed and per Dr. Quentin Ore he will need to be rescheduled. Dr. Quentin Ore is ok with scheduling him on 05/11/22 at 1:30pm.  I will keep trying to call the Caregiver to update her on the new date/time and instructions.

## 2022-04-23 NOTE — Telephone Encounter (Signed)
Louis Reid who is caregiver for patient called in to state the pre op instructions for patient have not been followed. Patient did not stop his Eliquis 2 days as instructed.  Advised Louis Reid that patient's procedure will have to be rescheduled and we would contact her to reschedule.

## 2022-04-24 ENCOUNTER — Other Ambulatory Visit: Payer: Self-pay

## 2022-04-24 DIAGNOSIS — I5022 Chronic systolic (congestive) heart failure: Secondary | ICD-10-CM

## 2022-04-24 DIAGNOSIS — I480 Paroxysmal atrial fibrillation: Secondary | ICD-10-CM

## 2022-04-24 NOTE — Telephone Encounter (Signed)
Pt's Caregiver is aware of new date/time of procedure.   He did not have any recent labwork so he has been scheduled for labs on 04/28/22.  She is aware that he needs to hold Eliquis x 2 days with his last dose being on 12/1 (pm dose).  Procedure date is 05/11/22 @ 1:30

## 2022-04-28 ENCOUNTER — Other Ambulatory Visit: Payer: Medicare Other

## 2022-04-28 NOTE — Progress Notes (Deleted)
HPI: FU coronary artery disease and ischemic cardiomyopathy. Patient is status post coronary artery bypass graft in 1996. Patient had an anterior infarct in December of 1941 complicated by ventricular fibrillation arrest. The patient had PCI of his LAD. Course complicated by hypoxic encephalopathy. He is status post ICD 4/14. Cardiac catheterization August 2018 showed severe three-vessel coronary disease with patent saphenous vein graft to the diagonal, patent sequential saphenous vein graft to second and third marginals, patent LAD stent, atretic LIMA to the LAD and occluded saphenous vein graft to the right coronary artery.  There was a 70% proximal right coronary artery stenosis and FFR was 0.88.  Left ventricular end-diastolic pressure was normal. Right heart catheterization September 2019 showed pulmonary capillary wedge pressure of 1, cardiac output 4.1 and cardiac index 2.5. Last echocardiogram August 2021 showed ejection fraction 20 to 25%, mild left ventricular enlargement. Patient had been followed in CHF clinic.  Also with history of atrial fibrillation.  Was transiently on hospice care.  Since last seen,   Current Outpatient Medications  Medication Sig Dispense Refill   acetaminophen (TYLENOL) 500 MG tablet Take 1 tablet (500 mg total) by mouth every 6 (six) hours as needed for mild pain (or Fever >/= 101). 30 tablet 0   apixaban (ELIQUIS) 2.5 MG TABS tablet Take 1 tablet (2.5 mg total) by mouth 2 (two) times daily. 180 tablet 3   Cholecalciferol (D3-1000) 25 MCG (1000 UT) capsule Take 2,000 Units by mouth daily.     feeding supplement (ENSURE ENLIVE / ENSURE PLUS) LIQD Take 237 mLs by mouth 3 (three) times daily between meals. 237 mL 12   ferrous sulfate 325 (65 FE) MG tablet Take 325 mg by mouth daily with breakfast.     finasteride (PROSCAR) 5 MG tablet Take 5 mg by mouth daily.     furosemide (LASIX) 20 MG tablet Take 1 tablet (20 mg total) by mouth daily. 30 tablet 0   Multiple  Vitamins-Minerals (ICAPS AREDS 2 PO) Take 1 capsule by mouth 2 (two) times daily.     rosuvastatin (CRESTOR) 20 MG tablet Take 20 mg by mouth daily.     senna-docusate (SENOKOT-S) 8.6-50 MG tablet Take 1 tablet by mouth 2 (two) times daily.     zinc sulfate 220 (50 Zn) MG capsule Take 220 mg by mouth daily.     No current facility-administered medications for this visit.     Past Medical History:  Diagnosis Date   AICD (automatic cardioverter/defibrillator) present 09/13/2012   Anemia    CAD (coronary artery disease)    a. 1996 CABG x 5: LIMA->LAD, VG->D2, VG->OM1->OM2, VG->RCA; b. S/P LAD BMS in setting of atretic LIMA; c. 09/2001 PTCA/BrachyRx to LAD 2/2 ISR; d. 05/2012 VF Arrest PCI: Sev 3v dzs, VG->RCA 100, LIMA atretic, LAD 100 (PCI w/ POBA and thrombectomy);  c. 01/2017 Cath: LM mild dzs, LAD patent stent, D1 small, LCX 100, RCA 70p (FFR 0.88), VG->OM1->OM2 ok, VG->D2 ok. LIMA atretic. VG->RCA 100.   Chronic HFrEF (heart failure with reduced ejection fraction) ([place])    a. Echo 12/13: EF 20-25%; b. Echo 3/14: EF 25%; c. 01/2020 Echo: EF 20-25%, no rwma, nl RV fxn, triv MR.   Clotting disorder ([place])    Depression    "years ago" (02/28/2018)   History of kidney stones    HLD (hyperlipidemia)    Hypertension    "I've never had high blood pressure; on RX for other reasons" (02/28/2018)   Inguinal hernia    "  have one now on my left side" (09/13/2012); (02/28/2018)   Ischemic cardiomyopathy    a. Echo 12/13: EF 20-25%; b. Echo 3/14: EF 25%; c. 09/2012 s/p Abbott Ellipse DR 3818-29H DC AICD (ser # [id number]); d. 01/2020 Echo: EF 20-25%.   Myocardial infarction [facility]) 1996;  05/2012   Osteoporosis    Paroxysmal atrial fibrillation ([place]) 03/17/2016   a. noted on device interrogation. CHA2DS2VASc = 5-->Eliquis 2.5 bid.   Pectus excavatum    Pulmonary embolism ([place])    a. after adhesiolysis for SBO in 04/2012 => coumadin for 6 mos   Renal cyst    SBO (small bowel obstruction) ([place]) 04/2012   s/p  lysis of adhesions   Sinoatrial node dysfunction ([place])    Skin cancer    "left ear; face" (02/28/2018)   Ventricular fibrillation ([place]) 05/2012   . in setting of AL STEMI 12/13 => EF 20-25% => d/c on LifeVest (repeat echo planned in 08/2012)    Past Surgical History:  Procedure Laterality Date   CATARACT EXTRACTION W/ INTRAOCULAR LENS  IMPLANT, BILATERAL Bilateral    COLONOSCOPY     CORONARY ANGIOPLASTY WITH STENT PLACEMENT     "I've got 2 or 3 stents" (02/28/2018)   [place]   "CABG X5" (09/13/2012)   CYSTOSCOPY/RETROGRADE/URETEROSCOPY/STONE EXTRACTION WITH BASKET  1990's   ESOPHAGOGASTRODUODENOSCOPY  05/08/2012   Procedure: ESOPHAGOGASTRODUODENOSCOPY (EGD);  Surgeon: Juanita Craver, MD;  Location: [facility] ENDOSCOPY;  Service: Endoscopy;  Laterality: N/A;  Nevin Bloodgood put on for Dr. Collene Mares , Dr. Collene Mares will call if she wants another time   IMPLANTABLE CARDIOVERTER DEFIBRILLATOR IMPLANT N/A 09/13/2012   SJM Ellipse ER implanted by Dr Rayann Heman for secondary prevention   INGUINAL HERNIA REPAIR Right 1996   INTRAVASCULAR PRESSURE WIRE/FFR STUDY N/A 01/20/2017   Procedure: INTRAVASCULAR PRESSURE WIRE/FFR STUDY;  Surgeon: Wellington Hampshire, MD;  Location: [facility] CV LAB;  Service: Cardiovascular;  Laterality: N/A;   LAPAROTOMY  04/27/2012   Procedure: EXPLORATORY LAPAROTOMY;  Surgeon: Gwenyth Ober, MD;  Location: [facility];  Service: General;  Laterality: N/A;   LEFT HEART CATH AND CORS/GRAFTS ANGIOGRAPHY N/A 01/20/2017   Procedure: LEFT HEART CATH AND CORS/GRAFTS ANGIOGRAPHY;  Surgeon: Wellington Hampshire, MD;  Location: [facility] CV LAB;  Service: Cardiovascular;  Laterality: N/A;   LEFT HEART CATHETERIZATION WITH CORONARY ANGIOGRAM N/A 05/10/2012   Procedure: LEFT HEART CATHETERIZATION WITH CORONARY ANGIOGRAM;  Surgeon: Burnell Blanks, MD;  Location: [facility] CATH LAB;  Service: Cardiovascular;  Laterality: N/A;   PTCA     percutaneous transluminal coronary intervention and brachy therapy,  Bruce R. Olevia Perches, MD. EF 60%   RIGHT HEART CATH N/A 03/01/2018   Procedure: RIGHT HEART CATH;  Surgeon: Jolaine Artist, MD;  Location: [facility] CV LAB;  Service: Cardiovascular;  Laterality: N/A;   SKIN CANCER EXCISION     "left ear; face" (02/28/2018)   TRANSURETHRAL RESECTION OF PROSTATE      Social History   Socioeconomic History   Marital status: Widowed    Spouse name: Not on file   Number of children: 1   Years of education: Not on file   Highest education level: Not on file  Occupational History   Occupation: [name]    Employer: RETIRED  Tobacco Use   Smoking status: Former    Types: Cigarettes   Smokeless tobacco: Never   Tobacco comments:    09/14/2011; 02/28/2018  "quit smoking when I was ~ 17; probably didn't  smoke 10 packs in my life"  Vaping Use   Vaping Use: Never used  Substance and Sexual Activity   Alcohol use: Never   Drug use: Never   Sexual activity: Not Currently  Other Topics Concern   Not on file  Social History Narrative   Not on file   Social Determinants of Health   Financial Resource Strain: Low Risk  (01/29/2021)   Overall Financial Resource Strain (CARDIA)    Difficulty of Paying Living Expenses: Not very hard  Food Insecurity: No Food Insecurity (01/27/2021)   Hunger Vital Sign    Worried About Running Out of Food in the Last Year: Never true    [place] in the Last Year: Never true  Transportation Needs: No Transportation Needs (01/27/2021)   PRAPARE - Hydrologist (Medical): No    Lack of Transportation (Non-Medical): No  Physical Activity: Not on file  Stress: Not on file  Social Connections: Not on file  Intimate Partner Violence: Not on file    Family History  Problem Relation Age of Onset   Heart disease Father    Other Mother        brain tumor   Colon cancer Brother    Cancer Brother        Liver   Heart disease Brother     ROS: no fevers or chills, productive cough,  hemoptysis, dysphasia, odynophagia, melena, hematochezia, dysuria, hematuria, rash, seizure activity, orthopnea, PND, pedal edema, claudication. Remaining systems are negative.  Physical Exam: Well-developed well-nourished in no acute distress.  Skin is warm and dry.  HEENT is normal.  Neck is supple.  Chest is clear to auscultation with normal expansion.  Cardiovascular exam is regular rate and rhythm.  Abdominal exam nontender or distended. No masses palpated. Extremities show no edema. neuro grossly intact  ECG- personally reviewed  A/P  1 coronary artery disease-patient denies recurrent chest pain.  Continue statin.  Patient is not on aspirin given need for apixaban.  2 paroxysmal atrial fibrillation-continue apixaban.  3 ischemic cardiomyopathy-blood pressure has not allowed ARB/Entresto, beta-blocker.  Patient is followed by palliative care.  4 chronic systolic congestive heart failure-continue Lasix.  5 hyperlipidemia-continue statin.  6 ICD-Per EP.    Kirk Ruths, MD

## 2022-05-06 ENCOUNTER — Ambulatory Visit: Payer: Medicare Other

## 2022-05-07 ENCOUNTER — Ambulatory Visit: Payer: Medicare Other | Attending: Cardiology

## 2022-05-07 DIAGNOSIS — I5022 Chronic systolic (congestive) heart failure: Secondary | ICD-10-CM

## 2022-05-07 DIAGNOSIS — I480 Paroxysmal atrial fibrillation: Secondary | ICD-10-CM

## 2022-05-08 LAB — BASIC METABOLIC PANEL
BUN/Creatinine Ratio: 20 (ref 10–24)
BUN: 29 mg/dL — ABNORMAL HIGH (ref 8–27)
CO2: 23 mmol/L (ref 20–29)
Calcium: 10 mg/dL (ref 8.6–10.2)
Chloride: 106 mmol/L (ref 96–106)
Creatinine, Ser: 1.46 mg/dL — ABNORMAL HIGH (ref 0.76–1.27)
Glucose: 95 mg/dL (ref 70–99)
Potassium: 4.2 mmol/L (ref 3.5–5.2)
Sodium: 145 mmol/L — ABNORMAL HIGH (ref 134–144)
eGFR: 46 mL/min/{1.73_m2} — ABNORMAL LOW (ref >59)

## 2022-05-08 LAB — CBC
Hematocrit: 45.2 % (ref 37.5–51.0)
Hemoglobin: 14.6 g/dL (ref 13.0–17.7)
MCH: 30.7 pg (ref 26.6–33.0)
MCHC: 32.3 g/dL (ref 31.5–35.7)
MCV: 95 fL (ref 79–97)
Platelets: 158 10*3/uL (ref 150–450)
RBC: 4.76 x10E6/uL (ref 4.14–5.80)
RDW: 13.4 % (ref 11.6–15.4)
WBC: 6.2 10*3/uL (ref 3.4–10.8)

## 2022-05-08 NOTE — Pre-Procedure Instructions (Signed)
Attempted to call patient regarding procedure instructions.  Unable to leave message.  Mailbox full

## 2022-05-11 ENCOUNTER — Encounter (HOSPITAL_COMMUNITY)
Admission: RE | Disposition: A | Discharge status: Home / Self Care | Payer: Self-pay | Source: Home / Self Care | Attending: Cardiology

## 2022-05-11 ENCOUNTER — Other Ambulatory Visit: Payer: Self-pay

## 2022-05-11 ENCOUNTER — Ambulatory Visit (HOSPITAL_COMMUNITY)
Admission: RE | Admit: 2022-05-11 | Discharge: 2022-05-11 | Disposition: A | Discharge status: Home / Self Care | Payer: Medicare Other | Attending: Cardiology | Admitting: Cardiology

## 2022-05-11 DIAGNOSIS — I255 Ischemic cardiomyopathy: Secondary | ICD-10-CM | POA: Insufficient documentation

## 2022-05-11 DIAGNOSIS — Z87891 Personal history of nicotine dependence: Secondary | ICD-10-CM | POA: Insufficient documentation

## 2022-05-11 DIAGNOSIS — I5022 Chronic systolic (congestive) heart failure: Secondary | ICD-10-CM | POA: Insufficient documentation

## 2022-05-11 DIAGNOSIS — Z539 Procedure and treatment not carried out, unspecified reason: Secondary | ICD-10-CM | POA: Diagnosis not present

## 2022-05-11 DIAGNOSIS — Z4502 Encounter for adjustment and management of automatic implantable cardiac defibrillator: Secondary | ICD-10-CM | POA: Insufficient documentation

## 2022-05-11 SURGERY — ICD GENERATOR CHANGEOUT

## 2022-05-11 MED ORDER — POVIDONE-IODINE 10 % EX SWAB
2.0000 | Freq: Once | CUTANEOUS | Status: AC
  Administered 2022-05-11: 2 via TOPICAL

## 2022-05-11 MED ORDER — SODIUM CHLORIDE 0.9 % IV SOLN: 80.0000 mg | INTRAVENOUS | Status: DC

## 2022-05-11 MED ORDER — CEFAZOLIN SODIUM-DEXTROSE 2-4 GM/100ML-% IV SOLN: 2.0000 g | INTRAVENOUS | Status: DC

## 2022-05-11 MED ORDER — SODIUM CHLORIDE 0.9 % IV SOLN: INTRAVENOUS | Status: DC

## 2022-05-11 MED ORDER — CHLORHEXIDINE GLUCONATE 4 % EX LIQD
4.0000 | Freq: Once | CUTANEOUS | Status: DC
  Filled 2022-05-11: qty 60

## 2022-05-11 NOTE — H&P (Signed)
Electrophysiology Office Note Date: 05/11/2022   ID:  Jodeci, Roarty January 15, 1933, MRN [id number]   PCP: [name], MD Primary Cardiologist: Kirk Ruths, MD Electrophysiologist: Vickie Epley, MD    CC: Routine ICD follow-up   Mr Revard presents to the hospital today for generator replacement. His device is ERI since August 2023.   Today he presents for generator replacement of his device.  Device History: St. Jude Dual Chamber ICD implanted 09/2012 for chronic systolic CHF/ICM History of appropriate therapy: No History of AAD therapy: No       Past Medical History:  Diagnosis Date   AICD (automatic cardioverter/defibrillator) present 09/13/2012   Anemia     CAD (coronary artery disease)      a. 1996 CABG x 5: LIMA->LAD, VG->D2, VG->OM1->OM2, VG->RCA; b. S/P LAD BMS in setting of atretic LIMA; c. 09/2001 PTCA/BrachyRx to LAD 2/2 ISR; d. 05/2012 VF Arrest PCI: Sev 3v dzs, VG->RCA 100, LIMA atretic, LAD 100 (PCI w/ POBA and thrombectomy);  c. 01/2017 Cath: LM mild dzs, LAD patent stent, D1 small, LCX 100, RCA 70p (FFR 0.88), VG->OM1->OM2 ok, VG->D2 ok. LIMA atretic. VG->RCA 100.   Chronic HFrEF (heart failure with reduced ejection fraction) ([place])      a. Echo 12/13: EF 20-25%; b. Echo 3/14: EF 25%; c. 01/2020 Echo: EF 20-25%, no rwma, nl RV fxn, triv MR.   Clotting disorder ([place])     Depression      "years ago" (02/28/2018)   History of kidney stones     HLD (hyperlipidemia)     Hypertension      "I've never had high blood pressure; on RX for other reasons" (02/28/2018)   Inguinal hernia      "have one now on my left side" (09/13/2012); (02/28/2018)   Ischemic cardiomyopathy      a. Echo 12/13: EF 20-25%; b. Echo 3/14: EF 25%; c. 09/2012 s/p Abbott Ellipse DR [id number] DC AICD (ser # [id number]); d. 01/2020 Echo: EF 20-25%.   Myocardial infarction [facility]) 1996;  05/2012   Osteoporosis     Paroxysmal atrial fibrillation ([place]) 03/17/2016    a. noted on device  interrogation. CHA2DS2VASc = 5-->Eliquis 2.5 bid.   Pectus excavatum     Pulmonary embolism ([place])      a. after adhesiolysis for SBO in 04/2012 => coumadin for 6 mos   Renal cyst     SBO (small bowel obstruction) ([place]) 04/2012    s/p lysis of adhesions   Sinoatrial node dysfunction ([place])     Skin cancer      "left ear; face" (02/28/2018)   Ventricular fibrillation ([place]) 05/2012    . in setting of AL STEMI 12/13 => EF 20-25% => d/c on LifeVest (repeat echo planned in 08/2012)         Past Surgical History:  Procedure Laterality Date   CATARACT EXTRACTION W/ INTRAOCULAR LENS  IMPLANT, BILATERAL Bilateral     COLONOSCOPY       CORONARY ANGIOPLASTY WITH STENT PLACEMENT        "I've got 2 or 3 stents" (02/28/2018)   [place]    "CABG X5" (09/13/2012)   CYSTOSCOPY/RETROGRADE/URETEROSCOPY/STONE EXTRACTION WITH BASKET   1990's   ESOPHAGOGASTRODUODENOSCOPY   05/08/2012    Procedure: ESOPHAGOGASTRODUODENOSCOPY (EGD);  Surgeon: Juanita Craver, MD;  Location: [facility] ENDOSCOPY;  Service: Endoscopy;  Laterality: N/A;  Nevin Bloodgood put on for Dr. Collene Mares , Dr. Collene Mares will call if she  wants another time   IMPLANTABLE CARDIOVERTER DEFIBRILLATOR IMPLANT N/A 09/13/2012    SJM Ellipse ER implanted by Dr Rayann Heman for secondary prevention   INGUINAL HERNIA REPAIR Right 1996   INTRAVASCULAR PRESSURE WIRE/FFR STUDY N/A 01/20/2017    Procedure: INTRAVASCULAR PRESSURE WIRE/FFR STUDY;  Surgeon: Wellington Hampshire, MD;  Location: [place] CV LAB;  Service: Cardiovascular;  Laterality: N/A;   LAPAROTOMY   04/27/2012    Procedure: EXPLORATORY LAPAROTOMY;  Surgeon: Gwenyth Ober, MD;  Location: [place];  Service: General;  Laterality: N/A;   LEFT HEART CATH AND CORS/GRAFTS ANGIOGRAPHY N/A 01/20/2017    Procedure: LEFT HEART CATH AND CORS/GRAFTS ANGIOGRAPHY;  Surgeon: Wellington Hampshire, MD;  Location: [facility] CV LAB;  Service: Cardiovascular;  Laterality: N/A;   LEFT HEART CATHETERIZATION WITH CORONARY ANGIOGRAM  N/A 05/10/2012    Procedure: LEFT HEART CATHETERIZATION WITH CORONARY ANGIOGRAM;  Surgeon: Burnell Blanks, MD;  Location: [facility] CATH LAB;  Service: Cardiovascular;  Laterality: N/A;   PTCA        percutaneous transluminal coronary intervention and brachy therapy, Bruce R. Olevia Perches, MD. EF 60%   RIGHT HEART CATH N/A 03/01/2018    Procedure: RIGHT HEART CATH;  Surgeon: Jolaine Artist, MD;  Location: [place] CV LAB;  Service: Cardiovascular;  Laterality: N/A;   SKIN CANCER EXCISION        "left ear; face" (02/28/2018)   TRANSURETHRAL RESECTION OF PROSTATE                Current Outpatient Medications  Medication Sig Dispense Refill   acetaminophen (TYLENOL) 500 MG tablet Take 1 tablet (500 mg total) by mouth every 6 (six) hours as needed for mild pain (or Fever >/= 101). 30 tablet 0   ALPRAZolam (XANAX) 0.25 MG tablet Take 1 tablet (0.25 mg total) by mouth 2 (two) times daily as needed for up to 3 days for anxiety or sleep. 6 tablet 0   apixaban (ELIQUIS) 2.5 MG TABS tablet Take 1 tablet (2.5 mg total) by mouth 2 (two) times daily. 60 tablet 0   Cholecalciferol (D3-1000) 25 MCG (1000 UT) capsule Take 2,000 Units by mouth daily.       feeding supplement (ENSURE ENLIVE / ENSURE PLUS) LIQD Take 237 mLs by mouth 3 (three) times daily between meals. 237 mL 12   ferrous sulfate 325 (65 FE) MG tablet Take 325 mg by mouth daily with breakfast.       finasteride (PROSCAR) 5 MG tablet Take 5 mg by mouth daily.       furosemide (LASIX) 20 MG tablet Take 1 tablet (20 mg total) by mouth daily. 30 tablet 0   Multiple Vitamins-Minerals (ICAPS AREDS 2 PO) Take 1 capsule by mouth 2 (two) times daily.       oxyCODONE-acetaminophen (PERCOCET) 5-325 MG tablet Take 1 tablet by mouth every 6 (six) hours as needed for up to 2 days for severe pain or moderate pain. 8 tablet 0   rosuvastatin (CRESTOR) 20 MG tablet Take 20 mg by mouth daily.       senna-docusate (SENOKOT-S) 8.6-50 MG tablet Take 1 tablet by  mouth 2 (two) times daily.       zinc sulfate 220 (50 Zn) MG capsule Take 220 mg by mouth daily.        No current facility-administered medications for this visit.      Allergies:   Patient has no known allergies.    Social History: Social History  Socioeconomic History   Marital status: Widowed      Spouse name: Not on file   Number of children: 1   Years of education: Not on file   Highest education level: Not on file  Occupational History   Occupation: [name]      Employer: RETIRED  Tobacco Use   Smoking status: Former      Types: Cigarettes   Smokeless tobacco: Never   Tobacco comments:      09/14/2011; 02/28/2018  "quit smoking when I was ~ 17; probably didn't smoke 10 packs in my life"  Vaping Use   Vaping Use: Never used  Substance and Sexual Activity   Alcohol use: Never   Drug use: Never   Sexual activity: Not Currently  Other Topics Concern   Not on file  Social History Narrative   Not on file    Social Determinants of Health        Financial Resource Strain: Low Risk  (01/29/2021)    Overall Financial Resource Strain (CARDIA)     Difficulty of Paying Living Expenses: Not very hard  Food Insecurity: No Food Insecurity (01/27/2021)    Hunger Vital Sign     Worried About Running Out of Food in the Last Year: Never true     [place] in the Last Year: Never true  Transportation Needs: No Transportation Needs (01/27/2021)    PRAPARE - Armed forces logistics/support/administrative officer (Medical): No     Lack of Transportation (Non-Medical): No  Physical Activity: Not on file  Stress: Not on file  Social Connections: Not on file  Intimate Partner Violence: Not on file      Family History:      Family History  Problem Relation Age of Onset   Heart disease Father     Other Mother          brain tumor   Colon cancer Brother     Cancer Brother          Liver   Heart disease Brother        Review of Systems: All other systems  reviewed and are otherwise negative except as noted above.     Physical Exam:    Vitals:    02/03/22 0948  BP: 120/76  Pulse: 87  SpO2: 98%  Weight: 117 lb 6.4 oz (53.3 kg)  Height: '5\' 11"'$  (1.803 m)      GEN- The patient is well appearing, alert and oriented x 3 today.   HEENT: normocephalic, atraumatic; sclera clear, conjunctiva pink; hearing intact; oropharynx clear; neck supple, no JVP Lymph- no cervical lymphadenopathy Lungs- Clear to ausculation bilaterally, normal work of breathing.  No wheezes, rales, rhonchi Heart- Regular rate and rhythm, no murmurs, rubs or gallops, PMI not laterally displaced GI- soft, non-tender, non-distended, bowel sounds present, no hepatosplenomegaly Extremities- no clubbing or cyanosis. No edema; DP/PT/radial pulses 2+ bilaterally MS- no significant deformity or atrophy Skin- warm and dry, no rash or lesion; ICD pocket well healed Psych- euthymic mood, full affect Neuro- strength and sensation are intact    Recent Labs: 07/18/2021: ALT 35; TSH 5.639 01/25/2022: B Natriuretic Peptide 389.1 01/29/2022: Magnesium 2.3 02/01/2022: BUN 46; Creatinine, Ser 1.10; Hemoglobin 11.8; Platelets 116; Potassium 3.9; Sodium 143       Wt Readings from Last 3 Encounters:  02/03/22 117 lb 6.4 oz (53.3 kg)  02/01/22 121 lb 11.1 oz (55.2 kg)  01/16/22 140 lb 3.4 oz (  63.6 kg)      Other studies Reviewed: Additional studies/ records that were reviewed today include: Previous EP office notes.    Assessment and Plan:   1.  ICD at [facility] for generator replacement.  Risks, benefits, and alternatives to ICD pulse generator replacement were discussed in detail today.  The patient understands that risks include but are not limited to bleeding, infection, pneumothorax, perforation, tamponade, vascular damage, renal failure, MI, stroke, death, inappropriate shocks, damage to his existing leads, and lead dislodgement and wishes to proceed.  We will therefore schedule  the procedure at the next available time.  Lysbeth Galas T. Quentin Ore, MD, [facility], [facility] Cardiac Electrophysiology 05/11/22 11:46 AM

## 2022-05-12 ENCOUNTER — Ambulatory Visit: Payer: Medicare Other | Attending: Cardiology | Admitting: Cardiology

## 2022-05-18 ENCOUNTER — Encounter: Payer: Self-pay | Admitting: Cardiology

## 2022-05-27 ENCOUNTER — Ambulatory Visit: Payer: Medicare Other

## 2022-05-27 ENCOUNTER — Telehealth: Payer: Self-pay

## 2022-05-27 NOTE — Telephone Encounter (Signed)
Per review of Pt chart, he did not have his generator change on May 11, 2022.   Per Dr. Quentin Ore- no plan to change out generator.  Pt is under hospice care.  Will cancel follow up appt's.

## 2022-06-11 ENCOUNTER — Encounter (HOSPITAL_COMMUNITY): Payer: Self-pay

## 2022-06-11 ENCOUNTER — Emergency Department (HOSPITAL_COMMUNITY)
Admission: EM | Admit: 2022-06-11 | Discharge: 2022-06-12 | Disposition: A | Discharge status: Skilled Nursing Facility | Payer: Medicare Other | Attending: Emergency Medicine | Admitting: Emergency Medicine

## 2022-06-11 ENCOUNTER — Emergency Department (HOSPITAL_COMMUNITY): Payer: Medicare Other

## 2022-06-11 ENCOUNTER — Other Ambulatory Visit: Payer: Self-pay

## 2022-06-11 DIAGNOSIS — Z8616 Personal history of COVID-19: Secondary | ICD-10-CM | POA: Insufficient documentation

## 2022-06-11 DIAGNOSIS — Z1152 Encounter for screening for COVID-19: Secondary | ICD-10-CM | POA: Diagnosis not present

## 2022-06-11 DIAGNOSIS — Z79899 Other long term (current) drug therapy: Secondary | ICD-10-CM | POA: Insufficient documentation

## 2022-06-11 DIAGNOSIS — R531 Weakness: Secondary | ICD-10-CM | POA: Insufficient documentation

## 2022-06-11 DIAGNOSIS — I5022 Chronic systolic (congestive) heart failure: Secondary | ICD-10-CM | POA: Diagnosis not present

## 2022-06-11 DIAGNOSIS — Z85828 Personal history of other malignant neoplasm of skin: Secondary | ICD-10-CM | POA: Diagnosis not present

## 2022-06-11 DIAGNOSIS — I2510 Atherosclerotic heart disease of native coronary artery without angina pectoris: Secondary | ICD-10-CM | POA: Insufficient documentation

## 2022-06-11 DIAGNOSIS — Z87891 Personal history of nicotine dependence: Secondary | ICD-10-CM | POA: Diagnosis not present

## 2022-06-11 DIAGNOSIS — N1832 Chronic kidney disease, stage 3b: Secondary | ICD-10-CM | POA: Diagnosis not present

## 2022-06-11 DIAGNOSIS — I130 Hypertensive heart and chronic kidney disease with heart failure and stage 1 through stage 4 chronic kidney disease, or unspecified chronic kidney disease: Secondary | ICD-10-CM | POA: Insufficient documentation

## 2022-06-11 DIAGNOSIS — R627 Adult failure to thrive: Secondary | ICD-10-CM

## 2022-06-11 DIAGNOSIS — Z7901 Long term (current) use of anticoagulants: Secondary | ICD-10-CM | POA: Insufficient documentation

## 2022-06-11 LAB — I-STAT CHEM 8, ED
BUN: 44 mg/dL — ABNORMAL HIGH (ref 8–23)
Calcium, Ion: 0.95 mmol/L — ABNORMAL LOW (ref 1.15–1.40)
Chloride: 114 mmol/L — ABNORMAL HIGH (ref 98–111)
Creatinine, Ser: 1.8 mg/dL — ABNORMAL HIGH (ref 0.61–1.24)
Glucose, Bld: 112 mg/dL — ABNORMAL HIGH (ref 70–99)
HCT: 42 % (ref 39.0–52.0)
Hemoglobin: 14.3 g/dL (ref 13.0–17.0)
Potassium: 4.7 mmol/L (ref 3.5–5.1)
Sodium: 140 mmol/L (ref 135–145)
TCO2: 22 mmol/L (ref 22–32)

## 2022-06-11 LAB — CBC WITH DIFFERENTIAL/PLATELET
Abs Immature Granulocytes: 0.04 10*3/uL (ref 0.00–0.07)
Basophils Absolute: 0 10*3/uL (ref 0.0–0.1)
Basophils Relative: 0 %
Eosinophils Absolute: 0 10*3/uL (ref 0.0–0.5)
Eosinophils Relative: 0 %
HCT: 46.2 % (ref 39.0–52.0)
Hemoglobin: 14.5 g/dL (ref 13.0–17.0)
Immature Granulocytes: 0 %
Lymphocytes Relative: 8 %
Lymphs Abs: 0.8 10*3/uL (ref 0.7–4.0)
MCH: 31.7 pg (ref 26.0–34.0)
MCHC: 31.4 g/dL (ref 30.0–36.0)
MCV: 101.1 fL — ABNORMAL HIGH (ref 80.0–100.0)
Monocytes Absolute: 0.7 10*3/uL (ref 0.1–1.0)
Monocytes Relative: 7 %
Neutro Abs: 8.2 10*3/uL — ABNORMAL HIGH (ref 1.7–7.7)
Neutrophils Relative %: 85 %
Platelets: 116 10*3/uL — ABNORMAL LOW (ref 150–400)
RBC: 4.57 MIL/uL (ref 4.22–5.81)
RDW: 14.8 % (ref 11.5–15.5)
WBC: 9.7 10*3/uL (ref 4.0–10.5)
nRBC: 0 % (ref 0.0–0.2)

## 2022-06-11 LAB — RESP PANEL BY RT-PCR (RSV, FLU A&B, COVID)  RVPGX2
Influenza A by PCR: NEGATIVE
Influenza B by PCR: NEGATIVE
Resp Syncytial Virus by PCR: NEGATIVE
SARS Coronavirus 2 by RT PCR: NEGATIVE

## 2022-06-11 LAB — I-STAT VENOUS BLOOD GAS, ED
Acid-base deficit: 1 mmol/L (ref 0.0–2.0)
Bicarbonate: 18.6 mmol/L — ABNORMAL LOW (ref 20.0–28.0)
Calcium, Ion: 0.95 mmol/L — ABNORMAL LOW (ref 1.15–1.40)
HCT: 43 % (ref 39.0–52.0)
Hemoglobin: 14.6 g/dL (ref 13.0–17.0)
O2 Saturation: 100 %
Potassium: 4.8 mmol/L (ref 3.5–5.1)
Sodium: 141 mmol/L (ref 135–145)
TCO2: 19 mmol/L — ABNORMAL LOW (ref 22–32)
pCO2, Ven: 21 mmHg — ABNORMAL LOW (ref 44–60)
pH, Ven: 7.556 — ABNORMAL HIGH (ref 7.25–7.43)
pO2, Ven: 203 mmHg — ABNORMAL HIGH (ref 32–45)

## 2022-06-11 LAB — COMPREHENSIVE METABOLIC PANEL
ALT: 46 U/L — ABNORMAL HIGH (ref 0–44)
AST: 53 U/L — ABNORMAL HIGH (ref 15–41)
Albumin: 3.2 g/dL — ABNORMAL LOW (ref 3.5–5.0)
Alkaline Phosphatase: 61 U/L (ref 38–126)
Anion gap: 14 (ref 5–15)
BUN: 37 mg/dL — ABNORMAL HIGH (ref 8–23)
CO2: 22 mmol/L (ref 22–32)
Calcium: 9.9 mg/dL (ref 8.9–10.3)
Chloride: 108 mmol/L (ref 98–111)
Creatinine, Ser: 1.85 mg/dL — ABNORMAL HIGH (ref 0.61–1.24)
GFR, Estimated: 35 mL/min — ABNORMAL LOW (ref >60)
Glucose, Bld: 115 mg/dL — ABNORMAL HIGH (ref 70–99)
Potassium: 4.7 mmol/L (ref 3.5–5.1)
Sodium: 144 mmol/L (ref 135–145)
Total Bilirubin: 1 mg/dL (ref 0.3–1.2)
Total Protein: 5.4 g/dL — ABNORMAL LOW (ref 6.5–8.1)

## 2022-06-11 LAB — PROTIME-INR
INR: 1.6 — ABNORMAL HIGH (ref 0.8–1.2)
Prothrombin Time: 18.6 seconds — ABNORMAL HIGH (ref 11.4–15.2)

## 2022-06-11 LAB — TROPONIN I (HIGH SENSITIVITY)
Troponin I (High Sensitivity): 46 ng/L — ABNORMAL HIGH (ref <18)
Troponin I (High Sensitivity): 48 ng/L — ABNORMAL HIGH (ref <18)

## 2022-06-11 LAB — BRAIN NATRIURETIC PEPTIDE: B Natriuretic Peptide: 219.9 pg/mL — ABNORMAL HIGH (ref 0.0–100.0)

## 2022-06-11 LAB — LACTIC ACID, PLASMA
Lactic Acid, Venous: 4.1 mmol/L (ref 0.5–1.9)
Lactic Acid, Venous: 1.6 mmol/L (ref 0.5–1.9)

## 2022-06-11 MED ORDER — SODIUM CHLORIDE 0.9 % IV SOLN: Freq: Once | INTRAVENOUS | Status: AC

## 2022-06-11 MED ORDER — SODIUM CHLORIDE 0.9 % IV BOLUS
1000.0000 mL | Freq: Once | INTRAVENOUS | Status: AC
  Administered 2022-06-11: 1000 mL via INTRAVENOUS

## 2022-06-11 MED ORDER — QUETIAPINE FUMARATE 25 MG PO TABS: 25.0000 mg | ORAL_TABLET | Freq: Every day | ORAL | 0 refills | Status: DC

## 2022-06-11 MED ORDER — QUETIAPINE FUMARATE 25 MG PO TABS
25.0000 mg | ORAL_TABLET | Freq: Every day | ORAL | Status: DC
  Administered 2022-06-11: 25 mg via ORAL
  Filled 2022-06-11: qty 1

## 2022-06-11 NOTE — ED Provider Notes (Addendum)
Failure to thrive or severe general weakness.  Patient has been refusing to eat or drink at nursing home facility previously had been on hospice but it expired.  Follow-up labs and reassess for disposition. Physical Exam  BP 121/63   Pulse (!) 43   Temp 98 F (36.7 C) (Oral)   Resp 16   Ht _0  (1.702 m)   Wt 57.6 kg   SpO2 97%   BMI 19.89 kg/m   Physical Exam  Procedures  Procedures  ED Course / MDM    Medical Decision Making Amount and/or Complexity of Data Reviewed Labs: ordered. Radiology: ordered.  Risk Prescription drug management.   Patient insists that he cannot function and does not get adequate care at [facility] care facility.  He reports that he is weak and cannot care for himself and is not getting care that is needed.  He reiterates this multiple times.  Patient has been taking liquids in the emergency department.  He is drinking Coca-Cola and water.  Is alert and situationally oriented.  He does not show evidence of delirium.  No respiratory distress at rest.  Patient has severe pectus excavatum.  Heart is regular.  Lungs are grossly clear.  Abdomen is soft and nondistended without guarding or pain.  Patient has extensive muscular atrophy of the extremities.  No significant peripheral edema.  I did have extensive discussion with the patient's son.  Patient has been in multiple nursing facilities and assisted living facilities.  Patient's son is also tried hiring in-home care which the patient fired after 15 minutes.  [facility].  Patient's son reports that they had a panel of physicians and care providers that encouraged him to stay and discussed all means of care however the patient left in manage 2 months at home before falling and being deemed unsafe.  He has also been admitted to [facility] but no facilities have met the patient's expectations.   Patient has been medically reviewed.  At this time his vital signs  are stable.  He is not hypotensive.  Oxygen saturation is 99% and heart rate is sinus in the 60s.  Patient is afebrile.  Troponin are flat at 46.  No signs of acute infectious process.  Plan will be to return the patient to his facility.  A repeat consultation has been placed to [facility] for assistance in medication management and comfort care.  Social work has called the facility and they advised they are trying to give the patient help which he is repeatedly refusing.  Will start nightly Seroquel.       Charlesetta Shanks, MD 06/11/22 2155    Charlesetta Shanks, MD 06/11/22 2208

## 2022-06-11 NOTE — Progress Notes (Signed)
TOC CSW and CM contacted [facility] to notify them of pt being in the hospital.  Mariann Laster from [facility] contacted [facility] and stated she has put pt in system for a referral.  Pt was with [facility] in August 2023 for palliative care in November 2023 it was discontinued.  Carnell Beavers Tarpley-Carter, MSW, LCSW-A Pronouns:  She/Her/Hers [facility] HealthTransitions of Care Clinical Social Worker Direct Number:  [phone] [email or web address]

## 2022-06-11 NOTE — ED Notes (Signed)
PA Blue found papers in back of WC of pt that states pt is basically FTT. Pt refuses to eat or drink. They state pt has been putting water in foley bag to make it appear normal.

## 2022-06-11 NOTE — ED Provider Triage Note (Signed)
Emergency Medicine Provider Triage Evaluation Note  Louis Reid , a 89 y.o. male  was evaluated in triage.  Pt complains of generalized weakness onset 2 days.  Notes that has been increasing.  Went to an appointment on yesterday and was told that he would need a pacemaker due to his symptoms.   Medical Decision Making  Medically screening exam initiated at 1:46 PM.  Appropriate orders placed.  Louis Reid was informed that the remainder of the evaluation will be completed by another provider, this initial triage assessment does not replace that evaluation, and the importance of remaining in the ED until their evaluation is complete.  Heart rate in triage from 42-47.    1:54 PM - Papers in back of wheelchair. Discussed with NP, Mia Saulter at facility ([facility]) who notes that patient was on hospice this time last year and no longer qualified, but has had similar concerns with not wanting to eat/drink and pouring water in his urinal. NP notes that patient has appeared to intentionally not eat/drink while at the facility. Pt informed her that he wants to go back on hospice. Pt wasn't able to get his pacemaker battery changed in December due to weight loss.   2:01 PM - Discussed with RN that patient is in need of a room immediately. RN aware and working on room placement.    Shaan Rhoads A, [name] 06/11/22 1402

## 2022-06-11 NOTE — ED Notes (Signed)
Spoke to [facility], no staff available to receive pt until 0730.

## 2022-06-11 NOTE — ED Triage Notes (Addendum)
Pt arrived via [facility] from [facility] assisted living for generalized weaknessx2 d. It's getting worse. Pt went to appt yesterday and was told he needed a pacemaker placed. Pt's initial bp for EMS 70/40. NS 566m. Bp 128/72 now.

## 2022-06-11 NOTE — ED Provider Notes (Signed)
[facility] EMERGENCY DEPARTMENT Provider Note  CSN: [id number] Arrival date & time: 06/11/22 1325  Chief Complaint(s) Weakness  HPI Louis Reid is a 89 y.o. male with history of ischemic cardiomyopathy, paroxysmal A-fib on Eliquis, hypertension, hyperlipidemia pectus excavatum presenting to the emergency department with weakness.  Patient reports that he just feels "terrible ".  He denies any focal symptoms such as chest pain, vomiting, abdominal pain, diarrhea.  He does report he had some nausea earlier.  He just reports weakness all over.  He reports that this has been present over the past few days.  No leg swelling.  He reports compliance with his medications.  Denies any headaches.   Per nursing facility patient has been not eating or drinking as much, previously on hospice but no longer on hospice.  Patient wants to be back on hospice care.  They are concerned he is not eating and drinking intentionally  Past Medical History Past Medical History:  Diagnosis Date   AICD (automatic cardioverter/defibrillator) present 09/13/2012   Anemia    CAD (coronary artery disease)    a. 1996 CABG x 5: LIMA->LAD, VG->D2, VG->OM1->OM2, VG->RCA; b. S/P LAD BMS in setting of atretic LIMA; c. 09/2001 PTCA/BrachyRx to LAD 2/2 ISR; d. 05/2012 VF Arrest PCI: Sev 3v dzs, VG->RCA 100, LIMA atretic, LAD 100 (PCI w/ POBA and thrombectomy);  c. 01/2017 Cath: LM mild dzs, LAD patent stent, D1 small, LCX 100, RCA 70p (FFR 0.88), VG->OM1->OM2 ok, VG->D2 ok. LIMA atretic. VG->RCA 100.   Chronic HFrEF (heart failure with reduced ejection fraction) ([place])    a. Echo 12/13: EF 20-25%; b. Echo 3/14: EF 25%; c. 01/2020 Echo: EF 20-25%, no rwma, nl RV fxn, triv MR.   Clotting disorder ([place])    Depression    "years ago" (02/28/2018)   History of kidney stones    HLD (hyperlipidemia)    Hypertension    "I've never had high blood pressure; on RX for other reasons" (02/28/2018)   Inguinal hernia    "have  one now on my left side" (09/13/2012); (02/28/2018)   Ischemic cardiomyopathy    a. Echo 12/13: EF 20-25%; b. Echo 3/14: EF 25%; c. 09/2012 s/p Abbott Ellipse DR 2025-42H DC AICD (ser # [id number]); d. 01/2020 Echo: EF 20-25%.   Myocardial infarction [facility]) 1996;  05/2012   Osteoporosis    Paroxysmal atrial fibrillation ([place]) 03/17/2016   a. noted on device interrogation. CHA2DS2VASc = 5-->Eliquis 2.5 bid.   Pectus excavatum    Pulmonary embolism ([place])    a. after adhesiolysis for SBO in 04/2012 => coumadin for 6 mos   Renal cyst    SBO (small bowel obstruction) ([place]) 04/2012   s/p lysis of adhesions   Sinoatrial node dysfunction ([place])    Skin cancer    "left ear; face" (02/28/2018)   Ventricular fibrillation ([place]) 05/2012   . in setting of AL STEMI 12/13 => EF 20-25% => d/c on LifeVest (repeat echo planned in 08/2012)   Patient Active Problem List   Diagnosis Date Noted   COVID-19 virus infection 07/19/2021   Decubital ulcer 04/04/2021   AKI (acute kidney injury) ([place]) 04/02/2021   Splenic infarct    Hepatic steatosis    Stage 3b chronic kidney disease ([place]) 03/11/2021   Unable to care for self 03/11/2021   Constipation 03/11/2021   Failure to thrive in adult 03/11/2021   Protein-calorie malnutrition, severe 02/22/2021   Generalized weakness    Palliative care encounter  Goals of care, counseling/discussion    Encounter for hospice care discussion    Heart failure ([place]) 02/28/2018   Decreased cardiac ejection fraction 01/10/2018   Nonspecific chest pain    Chronic HFrEF (heart failure with reduced ejection fraction) ([place])    Unstable angina ([place]):  (01/20/2017) a. 3-v CAD w/ patent SVG to diag and patent sequential SVG to OM 2 and OM 3. Patent prox LAD stent. LIMA to LAD and SVG to RCA known occl. stable prox RCA sten  01/19/2017   Paroxysmal atrial fibrillation ([place]) 03/17/2016   Claudication ([place]) 01/22/2014   CKD (chronic kidney disease) stage 2, GFR 60-89 ml/min 12/26/2013   ICD  (implantable cardioverter-defibrillator) in place 02/16/2013   Postop check 07/05/2012   STEMI (ST elevation myocardial infarction) ([place]) 05/18/2012   Anemia in chronic kidney disease(285.21) 05/17/2012   Pulmonary embolism ([place]) 05/17/2012   Physical deconditioning 05/17/2012   Anorexia 05/17/2012   Cardiogenic shock ([place]) 05/16/2012   Arterial hypotension 05/16/2012   Ventricular fibrillation arrest 05/10/2012   Cardiac arrest ([place]) 05/10/2012   Acute respiratory failure with hypoxia 2/2 cardiac arrest 05/10/2012   Acute encephalopathy 2/2 cardiac arrest 05/10/2012   Hyperglycemia 05/10/2012   Odynophagia 05/07/2012   Coronary artery disease 07/09/2011   History of coronary artery bypass surgery 07/09/2011   DYSPNEA 06/04/2010   Hyperlipidemia, unspecified 04/17/2009   Essential hypertension 04/17/2009   Asymptomatic old MI (myocardial infarction) 04/17/2009   TOBACCO USE, QUIT 04/17/2009   Home Medication(s) Prior to Admission medications   Medication Sig Start Date End Date Taking? Authorizing Provider  acetaminophen (TYLENOL) 500 MG tablet Take 1 tablet (500 mg total) by mouth every 6 (six) hours as needed for mild pain (or Fever >/= 101). 03/15/21   Cherene Altes, MD  apixaban (ELIQUIS) 2.5 MG TABS tablet Take 1 tablet (2.5 mg total) by mouth 2 (two) times daily. 02/03/22   Shirley Friar, [name]  Cholecalciferol (D3-1000) 25 MCG (1000 UT) capsule Take 2,000 Units by mouth daily.    [provider]  feeding supplement (ENSURE ENLIVE / ENSURE PLUS) LIQD Take 237 mLs by mouth 3 (three) times daily between meals. 03/15/21   Cherene Altes, MD  ferrous sulfate 325 (65 FE) MG tablet Take 325 mg by mouth daily with breakfast.    [provider]  finasteride (PROSCAR) 5 MG tablet Take 5 mg by mouth daily. 08/30/19   [provider]  furosemide (LASIX) 20 MG tablet Take 1 tablet (20 mg total) by mouth daily. 01/29/22 02/28/22  Wyvonnia Dusky, MD   Multiple Vitamins-Minerals (ICAPS AREDS 2 PO) Take 1 capsule by mouth 2 (two) times daily.    [provider]  rosuvastatin (CRESTOR) 20 MG tablet Take 20 mg by mouth daily.    [provider]  senna-docusate (SENOKOT-S) 8.6-50 MG tablet Take 1 tablet by mouth 2 (two) times daily. 03/15/21   Cherene Altes, MD  zinc sulfate 220 (50 Zn) MG capsule Take 220 mg by mouth daily.    [provider]  Past Surgical History Past Surgical History:  Procedure Laterality Date   CATARACT EXTRACTION W/ INTRAOCULAR LENS  IMPLANT, BILATERAL Bilateral    COLONOSCOPY     CORONARY ANGIOPLASTY WITH STENT PLACEMENT     "I've got 2 or 3 stents" (02/28/2018)   CORONARY ARTERY BYPASS GRAFT  1996   "CABG X5" (09/13/2012)   CYSTOSCOPY/RETROGRADE/URETEROSCOPY/STONE EXTRACTION WITH BASKET  1990's   ESOPHAGOGASTRODUODENOSCOPY  05/08/2012   Procedure: ESOPHAGOGASTRODUODENOSCOPY (EGD);  Surgeon: Juanita Craver, MD;  Location: [facility] ENDOSCOPY;  Service: Endoscopy;  Laterality: N/A;  Nevin Bloodgood put on for Dr. Collene Mares , Dr. Collene Mares will call if she wants another time   IMPLANTABLE CARDIOVERTER DEFIBRILLATOR IMPLANT N/A 09/13/2012   SJM Ellipse ER implanted by Dr Rayann Heman for secondary prevention   INGUINAL HERNIA REPAIR Right 1996   INTRAVASCULAR PRESSURE WIRE/FFR STUDY N/A 01/20/2017   Procedure: INTRAVASCULAR PRESSURE WIRE/FFR STUDY;  Surgeon: Wellington Hampshire, MD;  Location: [place] CV LAB;  Service: Cardiovascular;  Laterality: N/A;   LAPAROTOMY  04/27/2012   Procedure: EXPLORATORY LAPAROTOMY;  Surgeon: Gwenyth Ober, MD;  Location: [place];  Service: General;  Laterality: N/A;   LEFT HEART CATH AND CORS/GRAFTS ANGIOGRAPHY N/A 01/20/2017   Procedure: LEFT HEART CATH AND CORS/GRAFTS ANGIOGRAPHY;  Surgeon: Wellington Hampshire, MD;  Location: [place] CV LAB;  Service: Cardiovascular;   Laterality: N/A;   LEFT HEART CATHETERIZATION WITH CORONARY ANGIOGRAM N/A 05/10/2012   Procedure: LEFT HEART CATHETERIZATION WITH CORONARY ANGIOGRAM;  Surgeon: Burnell Blanks, MD;  Location: [facility] CATH LAB;  Service: Cardiovascular;  Laterality: N/A;   PTCA     percutaneous transluminal coronary intervention and brachy therapy, Bruce R. Olevia Perches, MD. EF 60%   RIGHT HEART CATH N/A 03/01/2018   Procedure: RIGHT HEART CATH;  Surgeon: Jolaine Artist, MD;  Location: [place] CV LAB;  Service: Cardiovascular;  Laterality: N/A;   SKIN CANCER EXCISION     "left ear; face" (02/28/2018)   TRANSURETHRAL RESECTION OF PROSTATE     Family History Family History  Problem Relation Age of Onset   Heart disease Father    Other Mother        brain tumor   Colon cancer Brother    Cancer Brother        Liver   Heart disease Brother     Social History Social History   Tobacco Use   Smoking status: Former    Types: Cigarettes   Smokeless tobacco: Never   Tobacco comments:    09/14/2011; 02/28/2018  "quit smoking when I was ~ 17; probably didn't smoke 10 packs in my life"  Vaping Use   Vaping Use: Never used  Substance Use Topics   Alcohol use: Never   Drug use: Never   Allergies Patient has no known allergies.  Review of Systems Review of Systems  All other systems reviewed and are negative.   Physical Exam Vital Signs  I have reviewed the triage vital signs BP 121/63   Pulse (!) 43   Temp 98 F (36.7 C) (Oral)   Resp 16   Ht '5\' 7"'$  (1.702 m)   Wt 57.6 kg   SpO2 97%   BMI 19.89 kg/m  Physical Exam Constitutional:      Comments: Appears malnourished, chronically ill and cachectic  HENT:     Right Ear: External ear normal.     Left Ear: External ear normal.     Mouth/Throat:     Mouth: Mucous membranes are dry.  Eyes:  Conjunctiva/sclera: Conjunctivae normal.  Cardiovascular:     Rate and Rhythm: Regular rhythm. Bradycardia present.  Pulmonary:     Effort:  Pulmonary effort is normal.     Breath sounds: Normal breath sounds. No rales.  Abdominal:     General: Abdomen is flat.     Palpations: Abdomen is soft.     Tenderness: There is no abdominal tenderness.  Musculoskeletal:     Right lower leg: No edema.     Left lower leg: No edema.  Skin:    General: Skin is warm and dry.     Capillary Refill: Capillary refill takes less than 2 seconds.  Neurological:     General: No focal deficit present.     Mental Status: He is oriented to person, place, and time.  Psychiatric:        Mood and Affect: Mood normal.        Behavior: Behavior normal.     ED Results and Treatments Labs (all labs ordered are listed, but only abnormal results are displayed) Labs Reviewed  I-STAT CHEM 8, ED - Abnormal; Notable for the following components:      Result Value   Chloride 114 (*)    BUN 44 (*)    Creatinine, Ser 1.80 (*)    Glucose, Bld 112 (*)    Calcium, Ion 0.95 (*)    All other components within normal limits  I-STAT VENOUS BLOOD GAS, ED - Abnormal; Notable for the following components:   pH, Ven 7.556 (*)    pCO2, Ven 21.0 (*)    pO2, Ven 203 (*)    Bicarbonate 18.6 (*)    TCO2 19 (*)    Calcium, Ion 0.95 (*)    All other components within normal limits  CULTURE, BLOOD (ROUTINE X 2)  CULTURE, BLOOD (ROUTINE X 2)  RESP PANEL BY RT-PCR (RSV, FLU A&B, COVID)  RVPGX2  COMPREHENSIVE METABOLIC PANEL  CBC WITH DIFFERENTIAL/PLATELET  PROTIME-INR  LACTIC ACID, PLASMA  LACTIC ACID, PLASMA  BRAIN NATRIURETIC PEPTIDE  URINALYSIS, ROUTINE W REFLEX MICROSCOPIC  TROPONIN I (HIGH SENSITIVITY)                                                                                                                          Radiology No results found.  Pertinent labs & imaging results that were available during my care of the patient were reviewed by me and considered in my medical decision making (see MDM for details).  Medications Ordered in  ED Medications  sodium chloride 0.9 % bolus 1,000 mL (1,000 mLs Intravenous Bolus 06/11/22 1520)  Procedures Procedures  (including critical care time)  Medical Decision Making / ED Course   MDM:  89 year old male, chronically ill-appearing presenting with feeling bad.  Concern from his nursing facility is not eating or drinking.  Patient very cachectic appearing, chronically ill-appearing.  Vitals with bradycardia.  Apparently was hypotensive with EMS but not hypotensive here.  Patient extremely cachectic, exam without other obvious focal abnormality, no abdominal tenderness, lungs are clear.  He does appear dehydrated.  No peripheral edema.  Will obtain broad toxic/metabolic workup, infectious workup such as chest x-ray and urinalysis.  His EKG initially had some questionable T wave changes on repeat appears without evidence of STEMI.  He also denies chest pain.  Signed out pending remainder of laboratory testing.      Additional history obtained: -Additional history obtained from ems -External records from outside source obtained and reviewed including: Chart review including previous notes, labs, imaging, consultation notes including ED note 02/13/22   Lab Tests: -I ordered, reviewed, and interpreted labs.   The pertinent results include:   Labs Reviewed  I-STAT CHEM 8, ED - Abnormal; Notable for the following components:      Result Value   Chloride 114 (*)    BUN 44 (*)    Creatinine, Ser 1.80 (*)    Glucose, Bld 112 (*)    Calcium, Ion 0.95 (*)    All other components within normal limits  I-STAT VENOUS BLOOD GAS, ED - Abnormal; Notable for the following components:   pH, Ven 7.556 (*)    pCO2, Ven 21.0 (*)    pO2, Ven 203 (*)    Bicarbonate 18.6 (*)    TCO2 19 (*)    Calcium, Ion 0.95 (*)    All other components within normal limits   CULTURE, BLOOD (ROUTINE X 2)  CULTURE, BLOOD (ROUTINE X 2)  RESP PANEL BY RT-PCR (RSV, FLU A&B, COVID)  RVPGX2  COMPREHENSIVE METABOLIC PANEL  CBC WITH DIFFERENTIAL/PLATELET  PROTIME-INR  LACTIC ACID, PLASMA  LACTIC ACID, PLASMA  BRAIN NATRIURETIC PEPTIDE  URINALYSIS, ROUTINE W REFLEX MICROSCOPIC  TROPONIN I (HIGH SENSITIVITY)     EKG   EKG Interpretation  Date/Time:  Thursday June 11 2022 14:28:34 EST Ventricular Rate:  78 PR Interval:  172 QRS Duration: 110 QT Interval:  383 QTC Calculation: 437 R Axis:   -25 Text Interpretation: Sinus rhythm Paired ventricular premature complexes Borderline left axis deviation Abnormal R-wave progression, late transition Repolarization abnormality, prob rate related Minimal ST elevation, inferior leads Artifact in lead(s) I II III aVR aVL aVF V1 V2 V5 V6 No STEMI Confirmed by Garnette Gunner [phone]) on 06/11/2022 3:27:46 PM         chest x-rayMedicines ordered and prescription drug management: Meds ordered this encounter  Medications   sodium chloride 0.9 % bolus 1,000 mL    -I have reviewed the patients home medicines and have made adjustments as needed   Cardiac Monitoring: The patient was maintained on a cardiac monitor.  I personally viewed and interpreted the cardiac monitored which showed an underlying rhythm of: sinus bradycardia  Social Determinants of Health:  Diagnosis or treatment significantly limited by social determinants of health: former smoker   Co morbidities that complicate the patient evaluation  Past Medical History:  Diagnosis Date   AICD (automatic cardioverter/defibrillator) present 09/13/2012   Anemia    CAD (coronary artery disease)    a. 1996 CABG x 5: LIMA->LAD, VG->D2, VG->OM1->OM2, VG->RCA; b. S/P LAD BMS in setting of atretic LIMA;  c. 09/2001 PTCA/BrachyRx to LAD 2/2 ISR; d. 05/2012 VF Arrest PCI: Sev 3v dzs, VG->RCA 100, LIMA atretic, LAD 100 (PCI w/ POBA and thrombectomy);  c. 01/2017 Cath:  LM mild dzs, LAD patent stent, D1 small, LCX 100, RCA 70p (FFR 0.88), VG->OM1->OM2 ok, VG->D2 ok. LIMA atretic. VG->RCA 100.   Chronic HFrEF (heart failure with reduced ejection fraction) ([place])    a. Echo 12/13: EF 20-25%; b. Echo 3/14: EF 25%; c. 01/2020 Echo: EF 20-25%, no rwma, nl RV fxn, triv MR.   Clotting disorder ([place])    Depression    "years ago" (02/28/2018)   History of kidney stones    HLD (hyperlipidemia)    Hypertension    "I've never had high blood pressure; on RX for other reasons" (02/28/2018)   Inguinal hernia    "have one now on my left side" (09/13/2012); (02/28/2018)   Ischemic cardiomyopathy    a. Echo 12/13: EF 20-25%; b. Echo 3/14: EF 25%; c. 09/2012 s/p Abbott Ellipse DR 4854-62V DC AICD (ser # [id number]); d. 01/2020 Echo: EF 20-25%.   Myocardial infarction [facility]) 1996;  05/2012   Osteoporosis    Paroxysmal atrial fibrillation ([place]) 03/17/2016   a. noted on device interrogation. CHA2DS2VASc = 5-->Eliquis 2.5 bid.   Pectus excavatum    Pulmonary embolism ([place])    a. after adhesiolysis for SBO in 04/2012 => coumadin for 6 mos   Renal cyst    SBO (small bowel obstruction) ([place]) 04/2012   s/p lysis of adhesions   Sinoatrial node dysfunction ([place])    Skin cancer    "left ear; face" (02/28/2018)   Ventricular fibrillation ([place]) 05/2012   . in setting of AL STEMI 12/13 => EF 20-25% => d/c on LifeVest (repeat echo planned in 08/2012)      Dispostion: Disposition decision including need for hospitalization was considered, and patient disposition pending at sign out    Final Clinical Impression(s) / ED Diagnoses Final diagnoses:  Weakness     This chart was dictated using voice recognition software.  Despite best efforts to proofread,  errors can occur which can change the documentation meaning.    Cristie Hem, MD 06/11/22 [phone]

## 2022-06-11 NOTE — Progress Notes (Addendum)
TOC CSW and CM received a return call from [place] at [facility] facility [phone].  Mia stated pt can return and address is as follows:  [address] [place], [place] [address]  Zaidin Blyden Tarpley-Carter, MSW, LCSW-A Pronouns:  She/Her/Hers [facility] Health[facility] of Care Clinical Social Worker Direct Number:  [phone] [email or web address]

## 2022-06-11 NOTE — Progress Notes (Addendum)
TOC CSW and CM contacted [facility] facility.  CSW left HIPPA compliant message with my contact information.   Rithvik Orcutt Tarpley-Carter, MSW, LCSW-A Pronouns:  She/Her/Hers [facility] Care Clinical Social Worker Direct Number:  [phone] [email or web address]

## 2022-06-11 NOTE — ED Notes (Signed)
I informed PA Blue of pt's HR. She is going to see pt next

## 2022-06-11 NOTE — Discharge Instructions (Signed)
1.  A referral was made to [facility] palliative care through the emergency department. 2.  Start taking Seroquel nightly. 3.  Reviewed the continuation of additional medications with your primary care provider and palliative care provider.

## 2022-06-12 NOTE — ED Notes (Signed)
Assisted pt with toileting. Pt denies pain, and is a/ox4. Pt very tearful, upset and requesting not to return to current facility he is assigned to. Pt says the staff at the facility is mean and will not help him like he needs it. Social work was made aware of pts concerns.

## 2022-06-16 LAB — CULTURE, BLOOD (ROUTINE X 2)
Culture: NO GROWTH
Culture: NO GROWTH

## 2022-08-04 ENCOUNTER — Encounter: Payer: Medicare Other | Admitting: Cardiology

## 2022-08-07 DEATH — deceased

## 2022-08-18 ENCOUNTER — Encounter: Payer: Medicare Other | Admitting: Cardiology
